# Patient Record
Sex: Female | Born: 1935 | Race: White | Hispanic: No | Smoking: Never smoker
Health system: Southern US, Community
[De-identification: ages and names within clinical notes are randomized; demographics above are authoritative.]

## PROBLEM LIST (undated history)

## (undated) DIAGNOSIS — M419 Scoliosis, unspecified: Secondary | ICD-10-CM

## (undated) DIAGNOSIS — N390 Urinary tract infection, site not specified: Secondary | ICD-10-CM

## (undated) DIAGNOSIS — H269 Unspecified cataract: Secondary | ICD-10-CM

## (undated) DIAGNOSIS — J189 Pneumonia, unspecified organism: Secondary | ICD-10-CM

## (undated) DIAGNOSIS — E785 Hyperlipidemia, unspecified: Secondary | ICD-10-CM

## (undated) DIAGNOSIS — C801 Malignant (primary) neoplasm, unspecified: Secondary | ICD-10-CM

## (undated) DIAGNOSIS — E119 Type 2 diabetes mellitus without complications: Secondary | ICD-10-CM

## (undated) DIAGNOSIS — I10 Essential (primary) hypertension: Secondary | ICD-10-CM

## (undated) DIAGNOSIS — I639 Cerebral infarction, unspecified: Secondary | ICD-10-CM

## (undated) DIAGNOSIS — I4891 Unspecified atrial fibrillation: Secondary | ICD-10-CM

## (undated) DIAGNOSIS — M542 Cervicalgia: Secondary | ICD-10-CM

## (undated) DIAGNOSIS — S065XAA Traumatic subdural hemorrhage with loss of consciousness status unknown, initial encounter: Secondary | ICD-10-CM

## (undated) DIAGNOSIS — M1990 Unspecified osteoarthritis, unspecified site: Secondary | ICD-10-CM

## (undated) HISTORY — PX: CHOLECYSTECTOMY: SHX55

## (undated) HISTORY — DX: Traumatic subdural hemorrhage with loss of consciousness status unknown, initial encounter: S06.5XAA

## (undated) HISTORY — DX: Scoliosis, unspecified: M41.9

## (undated) HISTORY — DX: Unspecified osteoarthritis, unspecified site: M19.90

## (undated) HISTORY — DX: Essential (primary) hypertension: I10

## (undated) HISTORY — DX: Hyperlipidemia, unspecified: E78.5

## (undated) HISTORY — DX: Cervicalgia: M54.2

## (undated) HISTORY — DX: Urinary tract infection, site not specified: N39.0

## (undated) HISTORY — PX: EYE SURGERY: SHX253

## (undated) HISTORY — DX: Type 2 diabetes mellitus without complications: E11.9

## (undated) HISTORY — PX: REPLACEMENT TOTAL KNEE BILATERAL: SUR1225

## (undated) HISTORY — PX: APPENDECTOMY: SHX54

## (undated) HISTORY — DX: Unspecified cataract: H26.9

## (undated) HISTORY — DX: Malignant (primary) neoplasm, unspecified: C80.1

## (undated) HISTORY — PX: TONSILLECTOMY: SUR1361

## (undated) HISTORY — DX: Cerebral infarction, unspecified: I63.9

## (undated) HISTORY — PX: BREAST LUMPECTOMY: SHX2

## (undated) HISTORY — PX: JOINT REPLACEMENT: SHX530

---

## 1997-08-15 ENCOUNTER — Other Ambulatory Visit: Admission: RE | Admit: 1997-08-15 | Discharge: 1997-08-15 | Payer: Self-pay | Admitting: Cardiology

## 1999-01-25 ENCOUNTER — Other Ambulatory Visit: Admission: RE | Admit: 1999-01-25 | Discharge: 1999-01-25 | Payer: Self-pay | Admitting: Family Medicine

## 2000-03-04 ENCOUNTER — Encounter: Payer: Self-pay | Admitting: Family Medicine

## 2000-03-04 ENCOUNTER — Encounter: Admission: RE | Admit: 2000-03-04 | Discharge: 2000-03-04 | Payer: Self-pay | Admitting: Family Medicine

## 2001-03-25 ENCOUNTER — Encounter: Admission: RE | Admit: 2001-03-25 | Discharge: 2001-03-25 | Payer: Self-pay | Admitting: Family Medicine

## 2001-03-25 ENCOUNTER — Encounter: Payer: Self-pay | Admitting: Family Medicine

## 2001-09-17 ENCOUNTER — Encounter: Payer: Self-pay | Admitting: Family Medicine

## 2001-09-17 ENCOUNTER — Encounter: Admission: RE | Admit: 2001-09-17 | Discharge: 2001-09-17 | Payer: Self-pay | Admitting: Family Medicine

## 2001-09-17 ENCOUNTER — Other Ambulatory Visit: Admission: RE | Admit: 2001-09-17 | Discharge: 2001-09-17 | Payer: Self-pay | Admitting: Radiology

## 2001-09-21 ENCOUNTER — Encounter: Payer: Self-pay | Admitting: General Surgery

## 2001-09-21 ENCOUNTER — Encounter: Admission: RE | Admit: 2001-09-21 | Discharge: 2001-09-21 | Payer: Self-pay | Admitting: General Surgery

## 2001-09-24 ENCOUNTER — Encounter: Payer: Self-pay | Admitting: General Surgery

## 2001-09-24 ENCOUNTER — Ambulatory Visit (HOSPITAL_BASED_OUTPATIENT_CLINIC_OR_DEPARTMENT_OTHER): Admission: RE | Admit: 2001-09-24 | Discharge: 2001-09-25 | Payer: Self-pay | Admitting: General Surgery

## 2001-10-04 ENCOUNTER — Ambulatory Visit: Admission: RE | Admit: 2001-10-04 | Discharge: 2002-01-02 | Payer: Self-pay | Admitting: Radiation Oncology

## 2001-10-13 ENCOUNTER — Ambulatory Visit (HOSPITAL_COMMUNITY): Admission: RE | Admit: 2001-10-13 | Discharge: 2001-10-13 | Payer: Self-pay | Admitting: Oncology

## 2001-10-13 ENCOUNTER — Encounter: Payer: Self-pay | Admitting: Oncology

## 2002-03-08 ENCOUNTER — Encounter: Payer: Self-pay | Admitting: Orthopedic Surgery

## 2002-03-14 ENCOUNTER — Inpatient Hospital Stay (HOSPITAL_COMMUNITY): Admission: RE | Admit: 2002-03-14 | Discharge: 2002-03-17 | Payer: Self-pay | Admitting: Orthopedic Surgery

## 2002-03-21 ENCOUNTER — Ambulatory Visit (HOSPITAL_COMMUNITY): Admission: RE | Admit: 2002-03-21 | Discharge: 2002-03-21 | Payer: Self-pay | Admitting: Orthopedic Surgery

## 2002-05-19 ENCOUNTER — Emergency Department (HOSPITAL_COMMUNITY): Admission: EM | Admit: 2002-05-19 | Discharge: 2002-05-19 | Payer: Self-pay | Admitting: Emergency Medicine

## 2002-05-23 ENCOUNTER — Inpatient Hospital Stay (HOSPITAL_COMMUNITY): Admission: EM | Admit: 2002-05-23 | Discharge: 2002-05-27 | Payer: Self-pay | Admitting: Emergency Medicine

## 2002-05-24 ENCOUNTER — Encounter: Payer: Self-pay | Admitting: Orthopedic Surgery

## 2002-06-14 ENCOUNTER — Encounter: Payer: Self-pay | Admitting: Emergency Medicine

## 2002-06-15 ENCOUNTER — Encounter: Payer: Self-pay | Admitting: Internal Medicine

## 2002-06-15 ENCOUNTER — Inpatient Hospital Stay (HOSPITAL_COMMUNITY): Admission: EM | Admit: 2002-06-15 | Discharge: 2002-06-17 | Payer: Self-pay | Admitting: Emergency Medicine

## 2002-08-10 ENCOUNTER — Encounter: Admission: RE | Admit: 2002-08-10 | Discharge: 2002-08-10 | Payer: Self-pay | Admitting: General Surgery

## 2002-08-10 ENCOUNTER — Encounter: Payer: Self-pay | Admitting: General Surgery

## 2002-11-11 ENCOUNTER — Encounter: Payer: Self-pay | Admitting: Family Medicine

## 2002-11-11 ENCOUNTER — Encounter: Admission: RE | Admit: 2002-11-11 | Discharge: 2002-11-11 | Payer: Self-pay | Admitting: Family Medicine

## 2002-11-28 ENCOUNTER — Encounter: Payer: Self-pay | Admitting: Critical Care Medicine

## 2002-11-28 ENCOUNTER — Ambulatory Visit (HOSPITAL_COMMUNITY): Admission: RE | Admit: 2002-11-28 | Discharge: 2002-11-28 | Payer: Self-pay | Admitting: Critical Care Medicine

## 2003-02-16 ENCOUNTER — Encounter: Payer: Self-pay | Admitting: Orthopedic Surgery

## 2003-02-16 ENCOUNTER — Ambulatory Visit (HOSPITAL_COMMUNITY): Admission: RE | Admit: 2003-02-16 | Discharge: 2003-02-16 | Payer: Self-pay | Admitting: Orthopedic Surgery

## 2003-08-08 ENCOUNTER — Encounter: Admission: RE | Admit: 2003-08-08 | Discharge: 2003-08-08 | Payer: Self-pay | Admitting: Critical Care Medicine

## 2003-08-21 ENCOUNTER — Encounter: Admission: RE | Admit: 2003-08-21 | Discharge: 2003-08-21 | Payer: Self-pay | Admitting: General Surgery

## 2003-09-13 ENCOUNTER — Encounter: Admission: RE | Admit: 2003-09-13 | Discharge: 2003-12-12 | Payer: Self-pay | Admitting: Family Medicine

## 2004-03-18 ENCOUNTER — Ambulatory Visit: Payer: Self-pay | Admitting: Critical Care Medicine

## 2004-04-08 ENCOUNTER — Encounter: Admission: RE | Admit: 2004-04-08 | Discharge: 2004-04-08 | Payer: Self-pay | Admitting: Family Medicine

## 2004-07-16 ENCOUNTER — Ambulatory Visit: Payer: Self-pay | Admitting: Critical Care Medicine

## 2004-08-13 ENCOUNTER — Ambulatory Visit: Payer: Self-pay | Admitting: Oncology

## 2004-08-21 ENCOUNTER — Encounter: Admission: RE | Admit: 2004-08-21 | Discharge: 2004-08-21 | Payer: Self-pay | Admitting: Family Medicine

## 2004-11-18 ENCOUNTER — Ambulatory Visit: Payer: Self-pay | Admitting: Critical Care Medicine

## 2005-02-10 ENCOUNTER — Ambulatory Visit: Payer: Self-pay | Admitting: Oncology

## 2005-03-13 ENCOUNTER — Ambulatory Visit: Payer: Self-pay | Admitting: Critical Care Medicine

## 2005-07-21 ENCOUNTER — Ambulatory Visit: Payer: Self-pay | Admitting: Critical Care Medicine

## 2005-07-23 ENCOUNTER — Ambulatory Visit: Payer: Self-pay | Admitting: Cardiology

## 2005-08-09 ENCOUNTER — Ambulatory Visit: Payer: Self-pay | Admitting: Critical Care Medicine

## 2005-08-11 ENCOUNTER — Ambulatory Visit: Payer: Self-pay | Admitting: Oncology

## 2005-08-11 LAB — CBC WITH DIFFERENTIAL/PLATELET
BASO%: 0.5 % (ref 0.0–2.0)
HCT: 39.6 % (ref 34.8–46.6)
LYMPH%: 16.7 % (ref 14.0–48.0)
MCH: 29.8 pg (ref 26.0–34.0)
MCHC: 34.3 g/dL (ref 32.0–36.0)
MCV: 87 fL (ref 81.0–101.0)
MONO#: 0.6 10*3/uL (ref 0.1–0.9)
MONO%: 9.2 % (ref 0.0–13.0)
NEUT%: 70.1 % (ref 39.6–76.8)
Platelets: 225 10*3/uL (ref 145–400)
RBC: 4.55 10*6/uL (ref 3.70–5.32)
WBC: 6.3 10*3/uL (ref 3.9–10.0)

## 2005-08-22 ENCOUNTER — Encounter: Admission: RE | Admit: 2005-08-22 | Discharge: 2005-08-22 | Payer: Self-pay | Admitting: Oncology

## 2005-09-15 LAB — CBC WITH DIFFERENTIAL/PLATELET
BASO%: 0.2 % (ref 0.0–2.0)
EOS%: 2.7 % (ref 0.0–7.0)
Eosinophils Absolute: 0.2 10*3/uL (ref 0.0–0.5)
LYMPH%: 13.3 % — ABNORMAL LOW (ref 14.0–48.0)
MCHC: 34.7 g/dL (ref 32.0–36.0)
MCV: 87.3 fL (ref 81.0–101.0)
MONO%: 9.5 % (ref 0.0–13.0)
NEUT#: 5.6 10*3/uL (ref 1.5–6.5)
Platelets: 229 10*3/uL (ref 145–400)
RBC: 4.6 10*6/uL (ref 3.70–5.32)
RDW: 13.8 % (ref 11.3–14.5)

## 2005-09-15 LAB — COMPREHENSIVE METABOLIC PANEL
ALT: 45 U/L — ABNORMAL HIGH (ref 0–40)
AST: 29 U/L (ref 0–37)
Albumin: 4.1 g/dL (ref 3.5–5.2)
Alkaline Phosphatase: 68 U/L (ref 39–117)
Glucose, Bld: 203 mg/dL — ABNORMAL HIGH (ref 70–99)
Potassium: 4.7 mEq/L (ref 3.5–5.3)
Sodium: 138 mEq/L (ref 135–145)
Total Bilirubin: 0.9 mg/dL (ref 0.3–1.2)
Total Protein: 6.6 g/dL (ref 6.0–8.3)

## 2005-11-25 ENCOUNTER — Inpatient Hospital Stay (HOSPITAL_COMMUNITY): Admission: EM | Admit: 2005-11-25 | Discharge: 2005-12-01 | Payer: Self-pay | Admitting: Emergency Medicine

## 2005-11-26 ENCOUNTER — Encounter: Payer: Self-pay | Admitting: Cardiovascular Disease

## 2005-11-26 ENCOUNTER — Encounter (INDEPENDENT_AMBULATORY_CARE_PROVIDER_SITE_OTHER): Payer: Self-pay | Admitting: Neurology

## 2005-11-26 ENCOUNTER — Ambulatory Visit: Payer: Self-pay | Admitting: Cardiovascular Disease

## 2005-11-28 ENCOUNTER — Ambulatory Visit: Payer: Self-pay | Admitting: Physical Medicine & Rehabilitation

## 2005-12-01 ENCOUNTER — Inpatient Hospital Stay (HOSPITAL_COMMUNITY)
Admission: RE | Admit: 2005-12-01 | Discharge: 2005-12-09 | Payer: Self-pay | Admitting: Physical Medicine & Rehabilitation

## 2006-01-07 ENCOUNTER — Encounter
Admission: RE | Admit: 2006-01-07 | Discharge: 2006-04-07 | Payer: Self-pay | Admitting: Physical Medicine & Rehabilitation

## 2006-01-07 ENCOUNTER — Ambulatory Visit: Payer: Self-pay | Admitting: Physical Medicine & Rehabilitation

## 2006-01-14 ENCOUNTER — Ambulatory Visit: Payer: Self-pay | Admitting: Critical Care Medicine

## 2006-03-12 ENCOUNTER — Ambulatory Visit: Payer: Self-pay | Admitting: Oncology

## 2006-03-16 LAB — COMPREHENSIVE METABOLIC PANEL
ALT: 35 U/L (ref 0–35)
AST: 22 U/L (ref 0–37)
BUN: 16 mg/dL (ref 6–23)
CO2: 30 mEq/L (ref 19–32)
Creatinine, Ser: 0.74 mg/dL (ref 0.40–1.20)
Glucose, Bld: 153 mg/dL — ABNORMAL HIGH (ref 70–99)
Sodium: 138 mEq/L (ref 135–145)
Total Protein: 6.3 g/dL (ref 6.0–8.3)

## 2006-03-16 LAB — CBC WITH DIFFERENTIAL/PLATELET
BASO%: 0.5 % (ref 0.0–2.0)
Basophils Absolute: 0 10e3/uL (ref 0.0–0.1)
EOS%: 5.3 % (ref 0.0–7.0)
Eosinophils Absolute: 0.4 10e3/uL (ref 0.0–0.5)
HCT: 41.8 % (ref 34.8–46.6)
HGB: 14.4 g/dL (ref 11.6–15.9)
LYMPH%: 13 % — ABNORMAL LOW (ref 14.0–48.0)
MCH: 29.6 pg (ref 26.0–34.0)
MCHC: 34.5 g/dL (ref 32.0–36.0)
MCV: 86 fL (ref 81.0–101.0)
MONO#: 0.5 10e3/uL (ref 0.1–0.9)
MONO%: 7.5 % (ref 0.0–13.0)
NEUT#: 5.2 10e3/uL (ref 1.5–6.5)
NEUT%: 73.7 % (ref 39.6–76.8)
Platelets: 247 10e3/uL (ref 145–400)
RBC: 4.86 10e6/uL (ref 3.70–5.32)
RDW: 13.6 % (ref 11.3–14.5)
WBC: 7.1 10e3/uL (ref 3.9–10.0)
lymph#: 0.9 10e3/uL (ref 0.9–3.3)

## 2006-03-16 LAB — CANCER ANTIGEN 27.29: CA 27.29: 23 U/mL (ref 0–39)

## 2006-07-07 ENCOUNTER — Ambulatory Visit: Payer: Self-pay | Admitting: Critical Care Medicine

## 2006-08-24 ENCOUNTER — Encounter: Admission: RE | Admit: 2006-08-24 | Discharge: 2006-08-24 | Payer: Self-pay | Admitting: Oncology

## 2006-09-08 ENCOUNTER — Ambulatory Visit: Payer: Self-pay | Admitting: Critical Care Medicine

## 2006-09-10 ENCOUNTER — Ambulatory Visit: Payer: Self-pay | Admitting: Oncology

## 2006-09-14 LAB — CBC WITH DIFFERENTIAL/PLATELET
BASO%: 0.2 % (ref 0.0–2.0)
EOS%: 4.3 % (ref 0.0–7.0)
HCT: 39.6 % (ref 34.8–46.6)
LYMPH%: 13.4 % — ABNORMAL LOW (ref 14.0–48.0)
MCH: 29.8 pg (ref 26.0–34.0)
MCHC: 35.7 g/dL (ref 32.0–36.0)
NEUT%: 72.8 % (ref 39.6–76.8)
Platelets: 298 10*3/uL (ref 145–400)
RBC: 4.75 10*6/uL (ref 3.70–5.32)
WBC: 8 10*3/uL (ref 3.9–10.0)
lymph#: 1.1 10*3/uL (ref 0.9–3.3)

## 2006-09-14 LAB — COMPREHENSIVE METABOLIC PANEL
ALT: 50 U/L — ABNORMAL HIGH (ref 0–35)
Albumin: 3.8 g/dL (ref 3.5–5.2)
Alkaline Phosphatase: 75 U/L (ref 39–117)
Glucose, Bld: 245 mg/dL — ABNORMAL HIGH (ref 70–99)
Potassium: 4.1 mEq/L (ref 3.5–5.3)
Sodium: 133 mEq/L — ABNORMAL LOW (ref 135–145)
Total Protein: 6.5 g/dL (ref 6.0–8.3)

## 2007-03-08 ENCOUNTER — Ambulatory Visit: Payer: Self-pay | Admitting: Oncology

## 2007-03-08 DIAGNOSIS — I10 Essential (primary) hypertension: Secondary | ICD-10-CM

## 2007-03-08 DIAGNOSIS — E1159 Type 2 diabetes mellitus with other circulatory complications: Secondary | ICD-10-CM

## 2007-03-08 DIAGNOSIS — E1169 Type 2 diabetes mellitus with other specified complication: Secondary | ICD-10-CM

## 2007-03-08 DIAGNOSIS — E785 Hyperlipidemia, unspecified: Secondary | ICD-10-CM

## 2007-03-08 DIAGNOSIS — Z853 Personal history of malignant neoplasm of breast: Secondary | ICD-10-CM

## 2007-03-08 DIAGNOSIS — J8410 Pulmonary fibrosis, unspecified: Secondary | ICD-10-CM

## 2007-03-08 DIAGNOSIS — K219 Gastro-esophageal reflux disease without esophagitis: Secondary | ICD-10-CM

## 2007-03-10 LAB — CBC WITH DIFFERENTIAL/PLATELET
BASO%: 0.6 % (ref 0.0–2.0)
EOS%: 4 % (ref 0.0–7.0)
LYMPH%: 20.2 % (ref 14.0–48.0)
MCH: 30.5 pg (ref 26.0–34.0)
MCHC: 35.6 g/dL (ref 32.0–36.0)
MCV: 85.8 fL (ref 81.0–101.0)
MONO%: 8.6 % (ref 0.0–13.0)
NEUT%: 66.6 % (ref 39.6–76.8)
Platelets: 247 10*3/uL (ref 145–400)
RBC: 4.32 10*6/uL (ref 3.70–5.32)
WBC: 6.3 10*3/uL (ref 3.9–10.0)

## 2007-03-10 LAB — COMPREHENSIVE METABOLIC PANEL
ALT: 58 U/L — ABNORMAL HIGH (ref 0–35)
Alkaline Phosphatase: 75 U/L (ref 39–117)
Creatinine, Ser: 1.44 mg/dL — ABNORMAL HIGH (ref 0.40–1.20)
Sodium: 135 mEq/L (ref 135–145)
Total Bilirubin: 0.6 mg/dL (ref 0.3–1.2)
Total Protein: 6.8 g/dL (ref 6.0–8.3)

## 2007-03-11 ENCOUNTER — Encounter: Admission: RE | Admit: 2007-03-11 | Discharge: 2007-03-11 | Payer: Self-pay | Admitting: Oncology

## 2007-04-09 ENCOUNTER — Ambulatory Visit: Payer: Self-pay | Admitting: Critical Care Medicine

## 2007-04-09 ENCOUNTER — Encounter: Payer: Self-pay | Admitting: Critical Care Medicine

## 2007-04-09 DIAGNOSIS — M1990 Unspecified osteoarthritis, unspecified site: Secondary | ICD-10-CM | POA: Insufficient documentation

## 2007-04-12 ENCOUNTER — Telehealth (INDEPENDENT_AMBULATORY_CARE_PROVIDER_SITE_OTHER): Payer: Self-pay | Admitting: *Deleted

## 2007-04-15 ENCOUNTER — Ambulatory Visit: Payer: Self-pay | Admitting: Cardiovascular Disease

## 2007-05-17 ENCOUNTER — Inpatient Hospital Stay (HOSPITAL_COMMUNITY): Admission: RE | Admit: 2007-05-17 | Discharge: 2007-05-22 | Payer: Self-pay | Admitting: Orthopedic Surgery

## 2007-05-21 ENCOUNTER — Encounter (INDEPENDENT_AMBULATORY_CARE_PROVIDER_SITE_OTHER): Payer: Self-pay | Admitting: Orthopedic Surgery

## 2007-05-21 ENCOUNTER — Ambulatory Visit: Payer: Self-pay | Admitting: Surgery

## 2007-06-07 ENCOUNTER — Encounter: Admission: RE | Admit: 2007-06-07 | Discharge: 2007-06-07 | Payer: Self-pay | Admitting: Orthopaedic Surgery

## 2007-06-23 ENCOUNTER — Ambulatory Visit: Payer: Self-pay | Admitting: Internal Medicine

## 2007-06-30 ENCOUNTER — Ambulatory Visit: Payer: Self-pay | Admitting: Internal Medicine

## 2007-07-07 ENCOUNTER — Ambulatory Visit: Payer: Self-pay | Admitting: Cardiology

## 2007-10-06 ENCOUNTER — Encounter: Admission: RE | Admit: 2007-10-06 | Discharge: 2007-10-06 | Payer: Self-pay | Admitting: Unknown Physician Specialty

## 2008-04-11 ENCOUNTER — Encounter: Admission: RE | Admit: 2008-04-11 | Discharge: 2008-04-11 | Payer: Self-pay | Admitting: Unknown Physician Specialty

## 2008-08-01 DIAGNOSIS — I82409 Acute embolism and thrombosis of unspecified deep veins of unspecified lower extremity: Secondary | ICD-10-CM | POA: Insufficient documentation

## 2008-08-01 DIAGNOSIS — E119 Type 2 diabetes mellitus without complications: Secondary | ICD-10-CM | POA: Insufficient documentation

## 2008-08-01 DIAGNOSIS — I6350 Cerebral infarction due to unspecified occlusion or stenosis of unspecified cerebral artery: Secondary | ICD-10-CM | POA: Insufficient documentation

## 2008-10-06 ENCOUNTER — Encounter: Admission: RE | Admit: 2008-10-06 | Discharge: 2008-10-06 | Payer: Self-pay | Admitting: Unknown Physician Specialty

## 2009-12-28 ENCOUNTER — Ambulatory Visit (HOSPITAL_COMMUNITY): Admission: RE | Admit: 2009-12-28 | Discharge: 2009-12-28 | Payer: Self-pay | Admitting: Obstetrics and Gynecology

## 2010-05-26 ENCOUNTER — Encounter: Payer: Self-pay | Admitting: Unknown Physician Specialty

## 2010-09-17 NOTE — Assessment & Plan Note (Signed)
[facility]                             PULMONARY OFFICE NOTE   NAME:[name], [name]                     MRN:          [id number]  DATE:09/08/2006                            DOB:          1926/03/06    [name] is a 77 year old white female with history of idiopathic  pulmonary fibrosis, hypertension and hypercholesterolemia, history of  breast cancer and reflux disease.  She has had increased dry cough for 1  month and this is concordance with a recent initiation of  lisinopril/hydrochlorothiazide 20/25 daily.  She does have reflux  disease and has now been able to achieve omeprazole at 20 mg b.i.d. with  her current insurance coverage.  She is off systemic steroids for her  pulmonary fibrosis, which we felt was due to microaspiration from  esophageal dysfunction and reflux.   PHYSICAL EXAMINATION:  Temperature was 98, blood pressure 118/80, pulse  102, saturation 92% on room air.  CHEST:  Showed dry rales at the bases, no active wheezing or rhonchi.  CARDIAC:  Exam showed a regular rate and rhythm without S3, normal S1  and S2.  ABDOMEN:  Protuberant.  EXTREMITIES:  Showed no edema or clubbing.  SKIN:  Clear.  NEUROLOGIC:  Exam was intact.  HEENT/NECK:  Exam showed no jugulovenous distention and no  lymphadenopathy, oropharynx clear, neck supple.   IMPRESSION:  Pulmonary fibrosis on the basis of chronic reflux disease  and microaspiration, stable at this time, exacerbated though with cyclic  cough on the basis of ACE inhibitor use.   RECOMMENDATIONS:  To give all due consideration to switching lisinopril  to another type of antihypertensive agent; the patient will discuss this  with Dr. Rosiland Oz.     [name], MD, [facility]  Electronically Signed    PEW/MedQ  DD: 09/08/2006  DT: 09/09/2006  Job #: [id number]   cc:   Avelino Leeds

## 2010-09-17 NOTE — Discharge Summary (Signed)
NAMEALVERDA, NAZZARO NO.:  [id number]   MEDICAL RECORD NO.:  [id number]          PATIENT TYPE:  INP   LOCATION:  5013                         FACILITY:  [facility]   PHYSICIAN:  Feliberto Gottron. Turner Daniels, M.D.   DATE OF BIRTH:  [birth year]   DATE OF ADMISSION:  05/17/2007  DATE OF DISCHARGE:  05/22/2007                               DISCHARGE SUMMARY   DISCHARGE DIAGNOSIS:  End-stage degenerative joint disease of the left  knee.   PROCEDURE:  Left total knee arthroplasty.   HISTORY OF PRESENT ILLNESS:  The patient is a 77 year old woman who  underwent a successful right total knee using Osteonics Scorpio  components in 2004.  She has had known end-stage arthritis in her left  knee and desires total knee arthroplasty on the left side.  The patient  has significant medical risks that prevented this previously, but has  had medical clearance to continue and after discussing risks and  benefits of the surgery, questions were answered, and the patient wishes  to proceed with surgery.   ALLERGIES:  No known drug allergies.   ADMISSION MEDICATIONS:  1. Pravastatin 20 mg daily.  2. Diclofenac 75 mg twice daily that she discontinued prior to her      surgery.  3. Glyburide 5 mg twice daily.  4. Nabumetone 500 mg b.i.d. which she again discontinued.  5. Micardis hydrochlorothiazide 40/12.5 b.i.d.  6. Imipram hydrochloride two tablets twice daily.  7. Atenolol 25 mg daily.  8. Primrose oil daily.  9. Multivitamin.  10.Daily aspirin.  11.Calcium with zinc.  12.Metoprolol.  13.Vitamin D.  14.Nexium 40 mg daily.  15.Oxycodone every 4-6 hours as needed.  16.Aciphex 20 mg daily.   PAST MEDICAL HISTORY:  Usual childhood diseases.  Adult history; DJD,  breast cancer, pulmonary fibrosis, GERD, hypertension, increased  cholesterol, DVT, aortic valve sclerosis with murmur, diabetes, and  small stroke in 2008.   PAST SURGICAL HISTORY:  Right total knee arthroplasty, mastectomy,  appendectomy.  No difficulty with GET.   SOCIAL HISTORY:  No tobacco, no ethanol, no IV drug abuse.  She is  married and retired.   FAMILY HISTORY:  Father died at age unknown.  Mother died of lung  cancer.   REVIEW OF SYSTEMS:  Positive for glasses.  The patient denies any  shortness of breath, chest pain, or recent illness.   PHYSICAL EXAMINATION:  VITAL SIGNS:  Temperature 97.8, pulse 94, blood  pressure 135/83.  The patient is 5 feet tall, 155 pound female.  HEENT:  Normocephalic and atraumatic.  Ears; TM's clear.  Eyes; pupils  equal, round, and reactive to light and accommodation.  Nose patent.  Throat benign.  NECK:  Supple.  Some pain to range of motion, but no obvious limits to  range.  CHEST:  Clear to auscultation and percussion.  HEART:  Regular rate and rhythm.  EXTREMITIES:  Left knee range of motion 5-95 degrees with pain and  crepitus.  Right knee range of motion 0-110 degrees, well-healed normal  scar without pain.   X-rays show bone on bone changes of the left knee.  LABORATORY DATA:  Preoperative labs including CBC, CMET, chest x-ray,  EKG, PT, and PTT were all within normal limits with the exception of  sinus bradycardia on her EKG as well as sodium level of 129 and glucose  of 411.  Also of note, AST and ALT were both high at 42 and 64  respectively.   HOSPITAL COURSE:  On the day of admission, the patient was taken to the  operating room at [facility] where she underwent a left total  knee arthroplasty using Osteonics Scorpio components.  They were  cemented with double batch palico cement that had 1500 mg of Zinacef  impregnated.  A #7 femur, #7 tibia, 7 patellar button, and 10 mm TS  spacer were used.  All were [employer] system.  Medium Hemovac  was double armed into the wound.  Foley catheter was placed  perioperatively.  The patient was placed on postoperative Coumadin  prophylaxis with a target INR of 1.5 to 2.  She was placed on   perioperative antibiotics.  She was placed on postoperative Dilaudid PCA  for pain control.  Physical therapy was begun in the recovery room using  CPM.  On postoperative day #1, the patient was awake and alert.  Pain  was 4/10.  She was taking p.o. well with no nausea and vomiting.  Vital  signs were stable.  Hemoglobin was 11.1.  Blood sugars 219, urine output  450, drain output less than 75 mL and discontinued.  She was  neurovascularly intact and otherwise stable.   On postoperative day #2, the patient was without complaints.  She was  out of bed to a chair.  She was afebrile.  Vital signs were stable.  Hemoglobin 10.2, INR 1.3.  Dressing was dry.  Calves were both soft.  She was tolerating the CPM well.   On postoperative day #3 the patient was complaining of minimal pain.  Hemoglobin 9.6, WBC 8.0, temperature ranging from 99.1 to 98.4.  CBG's  ranging from 219 to 238, INR 1.5.  Dressing was dry.  There was  increased ecchymosis in the calf.  She had not yet met her physical  therapy goals of safe transfers and ambulation, so she was held for an  additional day.   On postoperative day #4, the patient was complaining of greatly  increased pain in her left calf as well as swelling.  She was afebrile,  vital signs stable, dressing dry.  She was not short of breath or  complaining of any chest pain.  The calf was significantly swollen, much  more so than the day before with positive ecchymosis.  INR 1.7.   ASSESSMENT:  Satisfactory postoperative course, but with possible deep  venous thrombosis.  Venous Dopplers were ordered.  Fortunately they were  negative for any positive findings of any type of deep venous  thrombosis.  The following day, postoperative day #5, the patient was  mobilizing with minimal assistance, still complaining of calf pain.  Hemoglobin 9.6, INR 1.9.  Once again the Doppler was negative for any  deep venous thrombosis.  Wound was without any sign of infection.   She  was considered to be medically stable, orthopedically improved, and was  transferred home to the care of her family.   DISCHARGE MEDICATIONS:  1. Coumadin.  2. Percocet.  Prescriptions were added to instructions to restart her      home medicines as per home medicine rec sheet.   She will be followed up  for her home health PT, CPM, and any other home  health needs through [facility].  She should return to see [place] in  one weeks' time or sooner if she should have any drainage from the  wound, any pain that is not well controlled by oral pain medication, any  sort of fever greater than 101.  Her diet is regular.  She will need to  continue to use walker and be weightbearing as tolerated.  Dressing  changes daily or as needed.   DISCHARGE DIAGNOSIS:  End-stage degenerative joint disease left knee.   PROCEDURE:  Left total knee arthroplasty.      Laural Benes. Jannet Mantis.      Feliberto Gottron. Turner Daniels, M.D.  Electronically Signed    JBR/MEDQ  D:  06/07/2007  T:  06/07/2007  Job:  [id number]

## 2010-09-17 NOTE — Assessment & Plan Note (Signed)
[facility]                            CARDIOLOGY OFFICE NOTE   NAME:[name], [name]                     MRN:          [id number]  DATE:04/15/2007                            DOB:          [birth year]    Ms. [name] is a patient of Dr. Turner Daniels and Dr. Delford Field.  She is  referred for preoperative clearance.  She has a history of hypertension,  hypercholesterolemia, murmur.   The patient has had a previous right knee replacement about 4 years ago,  this was complicated by DVT.  She was on Coumadin for a while.   Dr. Delford Field has seen her in regards to her lungs.  She has a history of  interstitial fibrosis which is improved.  She has been off oxygen for a  year.  Her activities are limited primarily by her left knee pain.  She  has some exertional dyspnea which is stable, there has been no cough,  sputum or fever.  I do not have recent PFTs on the patient.  She does  have a history of breast cancer.  She underwent bilateral mastectomies,  there was also radiatio and I suspect that some of the fibrosis,  particularly in her upper lobes was from radiation.  She declined to  have chemotherapy as she thought it would be too hard on her husband.   Patient's coronary risk factors include hypertension, hyperlipidemia and  diabetes.  She has not had a recent stress test, however, she has never  had chest pain.  She did have a 2D echocardiogram done July 2007 which  was normal with some aortic valve sclerosis.   In talking to the patient I told her I think she would be cleared for  surgery.  She was previously on a beta blocker, her blood pressure seems  to be running a little bit high.  Over the last 2 months her lisinopril  was changed over to Micardis.  Rather than just increase the dose I  think I will add her beta blocker back prior to surgery.   REVIEW OF SYSTEMS:  Otherwise negative.   PAST MEDICAL HISTORY:  1. Breast cancer with radiation and  bilateral mastectomies.  2. Pulmonary fibrosis.  3. Previous GERD.  4. T&A.  5. Status post appendectomy.  6. Previous right knee surgery.  7. Hypercholesterolemia.  8. Hypertension.  9. Diabetes.   FAMILY HISTORY:  Noncontributory.   SHE HAS NO KNOWN ALLERGIES.   The patient is married.  She is a nondrinker, nonsmoker.  She is looking  forward to getting her knee replaced and has good family support.  Patient is retired and currently not working.  Activities are limited by  left knee pain.   MEDICATIONS:  1. Protonix 40 a day.  2. Prilosec p.r.n.  3. Multivitamins.  4. Micardis 40/12.5.  5. Calcium.  6. Imipramine 20 b.i.d.  7. Nabumetone.  8. Relafen 75 b.i.d.  9. Glyburide 5 a day.  10.Pravastatin 20 a day.   EXAMINATION:  Remarkable for an elderly white female in no distress.  Blood pressure is 150/80, pulse is 80 and  regular, weight is 175,  respiratory rate 14.  HEENT:  Unremarkable.  Carotids normal without bruit, no lymphadenopathy  or thyromegaly or JVP elevation.  LUNGS:  Clear with no interstitial fibrotic changes, no active wheezing,  good diaphragmatic motion.  S1 and S2 with a systolic ejection murmur with PMI normal.  ABDOMEN:  Benign, bowel sounds positive, no hepatosplenomegaly, no  hepatojugular reflux, no tenderness no bruit.  Distal pulses are intact.  She has mild varicosities in both lower legs,  left greater than right.  NEURO:  Nonfocal.  She has some crepitus in the left knee, she is status post right knee  replacement.   Baseline EKG is normal.   IMPRESSION:  1. In regards to preoperative clearance she should be fine, there is      no active history of coronary disease.  She has normal left      ventricular function by echo.  2. Hypertension, some white coat component.  Continue Micardis, add      beta blocker Toprol at 50 mg a day.  Follow up with Dr. Rosiland Oz      for further blood pressure care.  This would be the best medicine       to have in regards to her periop state.  3. Previous history of deep venous thrombosis, early mobilization and      strict Lovenox use with possible overlap of Coumadin for 6 months      is needed.  This should be done by Dr. Turner Daniels at the time of      surgery.  He did her last surgery and should be well aware that she      had a previous deep venous thrombosis.  I do not think it would be      worthwhile to sclerose her superficial varicosities prior to the      procedure.  4. Hyperlipidemia, continue pravastatin 20 a day.  Lipid and liver      profile in 6 months.  5. Diabetes, may need sliding scale insulin during hospitalization.      Continue glyburide up until the time of surgery.  Check hemoglobin      A1c.  6. History of interstitial fibrosis, seems improved.  Dr. Delford Field has      signed off on her for having surgery.  Her lung exam is benign.      She seems to have had a nice recovery.     Joan Pick. Eden Emms, MD, [facility]  Electronically Signed    PCN/MedQ  DD: 04/15/2007  DT: 04/15/2007  Job #: [id number]

## 2010-09-17 NOTE — Op Note (Signed)
[name], [name] NO.:  [id number]   MEDICAL RECORD NO.:  [id number]          PATIENT TYPE:  INP   LOCATION:  2899                         FACILITY:  [facility]   PHYSICIAN:  Feliberto Gottron. Turner Daniels, M.D.   DATE OF BIRTH:  06/16/1925   DATE OF PROCEDURE:  05/17/2007  DATE OF DISCHARGE:                               OPERATIVE REPORT   PREOPERATIVE DIAGNOSIS:  Osteoarthritis of the left knee.   POSTOPERATIVE DIAGNOSIS:  Osteoarthritis of the left knee.   PROCEDURE:  Left total knee arthroplasty using Osteonics Scorpio  components cemented with a double batch Palacos cement was 1500 mg of  Zinacef. I used a seven femur, seven tibia, seven patellar button  and a  10 mm TS spacer.  The components were from the [facility] system.   SURGEON:  Feliberto Gottron.  Turner Daniels, MD.   FIRST ASSISTANT:  Skip Mayer, PA-C.   ANESTHETIC:  General endotracheal.   ESTIMATED BLOOD LOSS:  Minimal.   FLUID REPLACEMENT:  1500 mL crystalloid.   DRAINS PLACED:  Two small Hemovacs and a Foley catheter.   URINE OUTPUT:  300 mL.   ESTIMATED BLOOD LOSS:  Minimal.   FLUID REPLACEMENT:  1200 mL crystalloid.   TOURNIQUET TIME:  1 hour.   INDICATIONS FOR PROCEDURE:  A 77 year old woman who underwent a  successful right total knee using Osteonics Scorpio components back in  2004 has end-stage arthritis of her left knee and desires total knee  arthroplasty on the left side.  The risks and benefits of surgery are  well-known to the patient and questions have been answered.   DESCRIPTION OF PROCEDURE:  The patient identified by armband, taken to  the operating room at [facility] where the appropriate  anesthetic site monitors were attached and general endotracheal  anesthesia was induced. She did have a femoral nerve block just prior to  being taken back to the operating room. A tourniquet was applied high to  the left thigh, foot positioner and lateral post applied to the table,  left  lower extremity prepped and draped in the usual sterile fashion  from the ankle to the tourniquet. The limb wrapped with an Esmarch  bandage, tourniquet inflated to 350 mmHg. We then began the procedure by  making an anterior midline incision starting a handbreadth above the  patella over the center of the patella and going 1 cm medial to and 3 cm  distal to the tibial tubercle.  Small bleeders in the skin and  subcutaneous tissue identified and cauterized.  The transverse  retinaculum was incised and reflected medially allowing a medial  parapatellar arthrotomy.  The patella was everted, the prepatellar fat  pad and the anterior one-half of the menisci were resected. The  superficial medial collateral ligament was then elevated off the  proximal tibia going from anterior to posterior leaving it intact  distally and stripping it off of the medial and metaphyseal flare of the  tibia. Once this was accomplished, the knee was then hyperflexed,  more  of the menisci resected. The ACL and the PCL were resected with  electrocautery, peripheral osteophytes were removed. A posterior medial  Z retractor was placed, a McHale retractor through the notch and lateral  Homan exposing the proximal tibia which was then entered with the  Osteonics step drill followed by the IM rod and a zero degree posterior  slope cutting guide which was pinned into place allowing resection of 4-  5 mm of bone medially, 7-8 mm of bone laterally consistent with the  varus deformity she had preoperatively.  Satisfied with the proximal  tibial cut, we then directed our attention to the femur and entered it 2-  mm anterior to the PCL origin with a step drill followed by the  intramedullary rod and a 5 degree left distal femoral cutting guide set  for a 10 mm cut.  This was pinned into place along the epicondylar axis  and then the 10 mm distal femoral cut accomplished. We sized for a #7  femoral component and pinned the  cutting guide in 3 degrees of external  rotation this was followed by the chamfer cutting guide which was  hammered into place and held there with towel clips. The anterior,  posterior and chamfer cuts accomplished without difficulty followed by  the Scorpio box cut. The patella was then measured for a #7 patellar  button and the posterior 8-9 mm of the patella resected with the cutting  guide and drilled for a #7 patella. The knee was once again hyperflexed,  the proximal tibia sized to a #7 tibial baseplate which was pinned into  place with the handle along the alignment of the second ray followed by  the Delta fit keel punches up to a 7 cemented punch.  At this point, a  trial 7 left femoral component was hammered into place, a trial seven  tibial baseplate with a 10 spacer was inserted, the knee came to full  extension, flexed to 130 degrees and the seven patellar button trial  tracked without difficulty. All trial components removed. The bony  surfaces were water picked clean, dried with suction and sponges, a  double batch of Palacos polymethyl methacrylate cement with 1500 mg of  Zinacef was then mixed and applied to all bony and metallic mating  surfaces except for the posterior condyles of the femur itself. In  order, we hammered into place a #7 tibial baseplate and removed excess  cement, a #7 left femoral component and removed excess cement and a 10  mm posterior stabilized Scorpio bearing was then snapped into the tibia  and the knee reduced.  A seven patellar button was then clamped onto the  patella and excess cement removed. The knee was brought to full  extension and held in 5 degrees of flexion with compression as the  cement cured. Medium hemostats were placed from a lateral approach.  The  wound was once again water picked clean with pulse lavage and then dried  with suction. After the cement cured, we checked our patella tracking  one more time, the ligaments were also  stable. Parapatellar arthrotomy  was closed with running #1 Vicryl suture, the subcutaneous tissue with 0  and 2-0 undyed Vicryl suture and the skin with skin staples.  A dressing  of Xeroform 4x4 dressing sponges, Webril and an Ace wrap was then  applied.  The tourniquet was let down, the patient awakened and taken to  the recovery room without difficulty.      Feliberto Gottron. Turner Daniels, M.D.  Electronically Signed     FJR/MEDQ  D:  05/17/2007  T:  05/17/2007  Job:  [id number]

## 2010-09-20 NOTE — Discharge Summary (Signed)
NAMEMAYIA, [name]              ACCOUNT NO.:  [id number]   MEDICAL RECORD NO.:  [id number]          PATIENT TYPE:  INP   LOCATION:  3004                         FACILITY:  [facility]   PHYSICIAN:  Melissa L. Ladona Ridgel, MD  DATE OF BIRTH:  11/10/1925   DATE OF PROCEDURE:  DATE OF DISCHARGE:  12/01/2005                      STAT - MUST CHANGE TO CORRECT WORK TYPE   CHIEF COMPLAINT:  At time of the admission:  Left upper and lower extremity  weakness with tingling.   DISCHARGE DIAGNOSES:  1. Right parietal and right thalamic stroke.  The patient was seen and      evaluated by [facility] hospitalist and neurology, had a acute stroke in the      right parietal, right thalamic region.  She was started on aspirin full-      dose therapy and evaluated for comorbid conditions.  She will require a      short rehab stay for dis-coordination of the left lower extremity and      left upper extremity.  2. Newly diagnosed diabetes.  The patient's hemoglobin A1c was noted to be      elevated at 7.5.  She was recently started on Glipizide for better      blood sugar control with good results.  3. Polymyalgia rheumatica.  The patient continued to have discomfort in      her shoulders which has been completely improved by adding a lidocaine      patch.  The patch could be cut into two and placed on both shoulders.      At this point, her right shoulder had been the problematic point, but      it did seem to help a little bit.  She will continue on her methadone      therapy.  4. Hypertension.  Patient is maintained on Cardura and has had reasonable      control in the 130s to 140s systolically.  5. History of breast cancer.  The patient is currently on no medication      for this.  6. Dyslipidemia.  The patient will be maintained on her Zetia.  7. Pulmonary fibrosis.  The patient is clinically stable from that      standpoint.  Her exam correlates with fibrosis in that she had      bibasilar crackles.   MEDICATIONS:  At the time of discharge will be:  1. Cardura 2 mg p.o. daily.  2. Ecotrin 325 mg q. daily.  3. Glucotrol 5 mg p.o. q. daily.  4. Lidoderm patch 5% which can be cut and placed on both shoulders daily      for 12 hours.  5. She has been on Lovenox 40 mg subcu 24 hours.  6. Methadone 5 mg p.o. t.i.d.  7. Multi-vitamin daily.  8. Niacin 500 mg p.o. q.h.s.  9. Os-Cal 1 tablet p.o. b.i.d.  10.Prilosec 20 mg p.o. q. daily with the addition of Protonix 40 mg p.o.      q. daily which is her home dose.  11.Prozac 20 mg p.o. q. daily for depression.  12.Toprol XL 25 mg p.o.  q. daily.  13.Xanax 0.25 mg p.o. daily.  14.Zetia 10 mg p.o. daily.  15.Ambien 5 mg p.o. q.h.s. p.r.n.  16.Percocet 1 tablet p.o. q.8. h. p.r.n.   HOSPITAL COURSE:  This is a very articulate, pleasant 77 year old right-  handed female who presented to the emergency room with left upper and left  lower extremity weakness and tingling.  CT scan of the head was found acute  right thalamic and parietal stroke.  She was, however, outside the window of  intervention for neurology.  Evidently, the patient had also fallen  backwards and hit her head two weeks ago but did not seek her physician for  evaluation.  Patient was initially evaluated by the stroke team but because  of being outside the window of therapeutic intervention, she was admitted to  by the hospitalist service.  The patient was admitted to telemetry, and  showed no evidence for any arrhythmias; however, she underwent a 2D echo  which showed a reasonable ejection fraction of 60%.  She had mild focal  basal septal hypertrophy but no evidence for embolic source was identified.  The patient also underwent carotid studies which showed no obvious internal  carotid artery stenosis, and she had anterograde bilateral vertebral  arterial flow.  The patient underwent MRI of the brain which confirmed  moderate stenosis of the left posterior cerebral artery and  acute infarct in  the right thalamic area.  The patient also has atrophy and chronic small  vessel disease.   At this time, the patient was found to have persisting deficits in the left  upper and lower extremity with dis-coordination and decreased strength.  She, therefore, has been deemed a candidate for inpatient rehabilitation.   On the day of discharge, the patient is clinically stable.  She remains with  some paresthesias in the left fingers and persisting pain from her  polymyalgia rheumatica.  Please note the patient is off Diclofenac because  of the introduction of aspirin.  Therefore, we have elected to add a  lidocaine patch.   PHYSICAL EXAMINATION:  VITAL SIGNS:  The patient's vital signs are stable.  Her temperature is 98, blood pressure 146/74.  Pulse is 64, respirations 18.  Temperature is 98.  Saturation is 96%.  GENERAL:  This is a very pleasant white female in no acute distress.  HEENT:  She is normocephalic, atraumatic.  Pupils are equal, round, reactive  to light.  Extraocular muscles are intact.  Mucous membranes are moist.  NECK:  Supple.  There is no JVD.  No lymphadenopathy, no carotid bruits.  CHEST:  Clear to auscultation in the upper lobes, but she does have some  fine crackles in the bilateral bases consistent with her pulmonary fibrosis.  CARDIOVASCULAR:  Regular rate and rhythm, positive S1, S2, no S3, S4.  No  murmurs, rubs or gallops are noted.  ABDOMEN:  Soft, nontender, nondistended with positive bowel sounds.  EXTREMITIES:  Show no clubbing, cyanosis or edema.  NEUROLOGIC:  She remains with decreased strength of 3+ in the left upper  extremity and 2+ in the left lower extremity with dis-coordination.   PERTINENT LABORATORY VALUES:  During the course of the hospital stay, as  stated the patient's hemoglobin A1c is elevated at 7.5, and she has been  started on Glucotrol for that.  Her homocysteine level is 5.4 requiring no intervention.  Patient's  total cholesterol is 206.  LDL of 147 and HDL of 36  with triglycerides of 117.  She remains  on Zetia and Niacin.  Her PT is 14.  INR was 1.1.  Her discharging BUN was 11, creatinine 0.9.   At this time, the patient seems to be well for followup as an outpatient  with an inpatient rehabilitation today prior to discharge.  Therefore, will  be transferred to the fourth floor for the further work with her left upper  and left lower extremities.      Melissa L. Ladona Ridgel, MD  Electronically Signed     MLT/MEDQ  D:  12/01/2005  T:  12/01/2005  Job:  [id number]   cc:   Gretta Arab. Valentina Lucks, M.D.

## 2010-09-20 NOTE — H&P (Signed)
NAMEJOLONDA, [name] NO.:  [id number]   MEDICAL RECORD NO.:  [id number]          PATIENT TYPE:  IPS   LOCATION:  4035                         FACILITY:  [facility]   PHYSICIAN:  Ranelle Oyster, M.D.DATE OF BIRTH:  23-Jun-1925   DATE OF ADMISSION:  12/01/2005  DATE OF DISCHARGE:                                HISTORY & PHYSICAL   REFERRING NEUROLOGIST:  Deanna Artis. Sharene Skeans, M.D.   PRIMARY CARE PHYSICIAN:  Dr. Valentina Lucks.   CHIEF COMPLAINT:  Left-sided weakness, sensory loss.   HISTORY OF PRESENT ILLNESS:  This 77 year old right-handed white female with  history of polymyalgia rheumatica admitted on November 25, 2005, with left-sided  weakness.  MRI at the time of admission revealed right thalamic infarct and  stenosis of the left PCA.  Echocardiogram  revealed ejection fraction of 60%  and no obvious source of embolus.  Patient's hemoglobin A1c was 7.5 and  patient was placed on glipizide for better glucose control.  The patient was  on aspirin and subcu Lovenox for anticoagulation at this point.   HOME MEDICATIONS:  1.  Protonix 40 mg a day.  2.  Diclofenac b.i.d.  3.  Omeprazole daily.  4.  Cardura 4 mg daily.  5.  Zetia 10 mg daily.  6.  Methadone 5 mg t.i.d.  7.  Prozac 20 mg daily.  8.  Alprazolam 0.25 mg daily.  9.  Oxy IR 5 mg t.i.d. p.r.n.  10. Multivitamins daily.   ALLERGIES:  NO KNOWN DRUG ALLERGIES.   REVIEW OF SYSTEMS:  Positive for low back pain, shoulder pain, depression,  swelling in the joints, reflux disease.   PAST MEDICAL HISTORY:  1.  Polymyalgia rheumatica.  2.  Hypertension.  3.  Hyperlipidemia.  4.  Cervical stenosis.  5.  Left mastectomy secondary to breast cancer.  6.  Right total knee replacement November 2003.  7.  Degenerative shoulder disease.  8.  DVT.  9.  Depression.   FAMILY HISTORY:  Positive for coronary artery disease and cancer.   SOCIAL HISTORY:  Negative for alcohol or tobacco.  Patient lives with her  husband  who can assist her as needed at home.  They have a one level  house  with one step to enter.   FUNCTIONAL HISTORY:  Patient is independent with a cane.  Currently patient  is moderate assist for mobility and self-care.   LABORATORY DATA:  Hemoglobin 13.3, white count 5.4, platelets 180,000.  Sodium 140, potassium 3.7, BUN and creatinine 11 and 0.9.   PHYSICAL EXAMINATION:  GENERAL APPEARANCE:  The patient is pleasant, in no  acute distress.  She is alert and oriented x3.  VITAL SIGNS:  Blood pressure 146/74, pulse 64, respiratory rate 18,  temperature 98.  HEENT:  Unremarkable with good dentition.  Oral mucosa is pink and moist.  NECK:  Supple without JVD or lymphadenopathy.  CHEST:  A few inspiratory wheezes on the right.  CARDIOVASCULAR:  Regular rate and rhythm without murmurs, rubs, or gallops.  ABDOMEN:  Soft and nontender.  NEUROLOGIC:  Cranial nerve exam revealed mild left facial and perioral  sensory loss.  Reflexes are trace to absent on the left side.  Sensation is  1/2 at the left arm and leg today.  Leg may have been a bit more effected  than the arm.  Motor function in the left upper extremity is 2+ to 3+/5.  Left lower extremity motor function is 1+ to 2/5.  Patient has intact  judgment, orientation, memory and mood.  Visual fields were within normal  limits.  Right-sided neurological extremity exam was within normal limits.  Pulses were 2+ and calves were nontender on exam today.  SKIN:  Intact.  Patient had poor range of motion of both shoulders due to  pain.   ASSESSMENT/PLAN:  1.  Functional deficits secondary to right parietal/thalamic infarct.  Begin      comprehensive inpatient rehab with physical therapy to evaluate and      treat lower extremity weakness and gait deficiencies.  Occupational      therapy will assess and treat for upper extremity use with activities of      daily living, 24-hour rehab nursing will assist in management of bowel,      bladder,  skin and medication issues.  Rehab case management/social      worker will assess for psychosocial needs and discharge planning.  2.  Pain management with methadone.  Will increase this to 10 mg t.i.d. for      now.  Consider bilateral shoulder injections.  Continue Lidoderm patch      and oxycodone IR p.r.n.  3.  Deep venous thrombosis prophylaxis with subcutaneous Lovenox.  4.  Stroke anticoagulation with aspirin 325 mg daily.  5.  Hypertension:  Continue Cardura and Toprol.  6.  Depression:  Continue Prozac and Xanax.  7.  Hyperlipidemia:  Zetia.  8.  Non-insulin dependent diabetes:  Continue glipizide 5 mg daily.  Monitor      CBGs a.c. and h.s.      Ranelle Oyster, M.D.  Electronically Signed     ZTS/MEDQ  D:  12/01/2005  T:  12/01/2005  Job:  [id number]

## 2010-09-20 NOTE — Assessment & Plan Note (Signed)
Ms. Kooi returns today after discharge from [facility].  She was an inpatient from December 01, 2005 to December 09, 2005, for right  parietal and thalamic infarct.  She was discharged home and has received  home health, has followed up with Dr. Maurice Small, as well as Dr. Billie Ruddy.  She has not returned to driving yet.  She states that the  Lidoderm patch for her shoulders really has helped her quite a bit, and in  fact we will discuss with her oncologist, Dr. Darnelle Catalan, perhaps reducing  some of her narcotic analgesics.   Her pain is described as 7/10, primarily in the shoulders.   She still needs some help performing meal preparation and household duties,  as well as shopping, but is independent with her self-care.  She walks  slowly, but is able to do so with a cane.  She has some problems with blood  sugar regulation due to diabetes, and has some constipation because of her  narcotic analgesics, but otherwise review of systems is negative.   SOCIAL HISTORY:  Married, lives with her husband.   Blood pressure is 116/69, pulse 66, respirations 18, O2 sat 95% on room air.  GENERAL:  No acute distress.  Mood and affect appropriate.   Examination shows no attempted shoulder abduction.  She complains that she  is afraid it may hurt too much.  She has equal grip strength, equal lower  extremity strength with exception of left ankle dorsiflexion.  Her gait is  without evidence of toe-drag, but she does have mild steppage to compensate  for some mild left ankle weakness.   IMPRESSION:  Cerebrovascular accident with left hemiparesis, improving.  She  has no evidence of left neglect or hemisensory deficits.  I think she can go  back to driving if she wishes, although she is somewhat hesitant to do so at  this point.  She will follow with Dr. Valentina Lucks for her general medical needs,  as well as Dr. Sharene Skeans from neurology.  I have given her some Lidoderm  patches and  this can be filled by her primary physician once these run out.  I will see her back on a p.r.n. basis.      Erick Colace, M.D.  Electronically Signed     AEK/MedQ  D:  01/08/2006 17:09:04  T:  01/09/2006 00:31:35  Job #:  [id number]   cc:   Valentino Hue. Magrinat, M.D.  Fax: [phone]

## 2010-09-20 NOTE — H&P (Signed)
[name], [name] NO.:  [id number]   MEDICAL RECORD NO.:  [id number]          PATIENT TYPE:  EMS   LOCATION:  MAJO                         FACILITY:  [facility]   PHYSICIAN:  Joan Gardener, MD    DATE OF BIRTH:  [birth year]   DATE OF ADMISSION:  11/25/2005  DATE OF DISCHARGE:                                HISTORY & PHYSICAL   PRIMARY CARE PHYSICIAN:  Dr. Maurice [name]   CHIEF COMPLAINT:  Increasing weakness in the left side of the body.   HISTORY OF PRESENT ILLNESS:  This is a 77 year old Caucasian female past  medical history of multiple problems presented with the above-mentioned  complaint.  The patient stated that for the last 2 months she has been  having weakness in her upper and lower extremities on and off which normally  comes for a few minutes and then resolves by self.  Last night this patient  started having left-sided weakness and she is not able to walk by herself or  use her left arm because of this weakness.  She was also complaining of  increasing numbness including her left lower extremity, left upper extremity  and left side of her face.  Yesterday was also complaining of slurred speech  and she said still her speech is a little bit slurred and it is not normal  as what it used to be.  Denied any blurred vision.  There is no urinary or  bowel incontinence.  There is no headache and there is no tremors or seizure  activity.  In the ER neurology was called by the ER attending and they  mention that since the patient is pass the window period for thrombolytics  so the patient can be admitted to medicine and neurology will follow as a  consult.  CT scan of the head was done and showed hypodense area in the  right thalamus that might represent subacute lacunar infarct otherwise it  was negative.   PAST MEDICAL HISTORY:  1. GERD.  2. Hypertension.  3. Hypercholesterolemia.  4. Depression.  5. Anxiety.  6. Osteoporosis.  7. Chronic pain  syndrome.   PAST SURGICAL HISTORY:  Status post right knee replacement.   MEDICATIONS:  1. Protonix 40 mg p.o. once daily.  2. Diclofenac twice daily p.r.n.  3. Omeprazole 40 mg p.o. once daily.  4. Cardura 4 mg p.o. once daily.  5. Zetia 10 mg p.o. once daily.  6. Fluoxetine 50 mg p.o. once daily.  7. Xanax 0.25 mg p.o. p.r.n.  8. Percocet three times daily p.r.n.  9. Multivitamin one tab p.o. once daily.  10.Calcium 600 mg p.o. twice daily.  11.Niacin 500 mg p.o. once daily.   ALLERGIES:  NO KNOWN DRUG ALLERGIES.   SOCIAL HISTORY:  Denies smoking.  Denies alcohol/drinking.  Denies  recreational drugs.   FAMILY HISTORY:  Family history is significant for hypertension.  There is  no coronary artery disease and no history of diabetes.   REVIEW OF SYSTEMS:  CONSTITUTIONAL:  There is no fever, no weight changes.  EYES:  No blurry vision, no pain,  no redness.  ENT:  No difficulty  swallowing.  There is slurred speech.  There is no epistaxis.  CARDIOVASCULAR:  No chest pain, no shortness of breath, no orthopnea, no  palpitations.  RESPIRATORY:  No cough, no dyspnea, no shortness of breath  and no hemoptysis.  ABDOMEN:  No nausea, no vomiting, no abdominal pain, no  diarrhea.  LOWER EXTREMITIES:  No edema, no rash.  SKIN:  No rash, no  erythema.  ID:  No rash, no lesions.  HEMATOLOGY:  No bleeding.  NEUROLOGY:  Positive for left-sided weakness, left-sided numbness and tingling, slurred  speech with decreased sensation in the left side.  The rest of systems were  reviewed and they were negative.   PHYSICAL EXAMINATION:  VITAL SIGNS:  Temperature is 97.8, blood pressure is  156/88, pulse is 67, respiratory rate is 18.  GENERAL APPEARANCE:  This is an elderly Caucasian female laying down in bed  in no acute distress at the present time.  HEENT:  Conjunctivae normal.  There is no pallor and no erythema.  Pupils  are equal and reactive to light and accommodation.  There is no ptosis.   Hearing is intact.  There is no ear discharge or  bleeding.  Oral mucosa is  moist and there is no pharyngeal erythema.  Neck is supple, no JVD, no  carotid bruit, no adenopathy, no thyroid enlargement or thyroid tenderness.  CARDIOVASCULAR EXAMINATION:  S1 and S2 is heard.  There is no additional  heart sounds  .  There is no murmurs, no gallops, or rubs.  RESPIRATORY EXAMINATION:  The patient is breathing between 16-18, no rales,  no rhonchi, no wheezes.  ABDOMINAL EXAMINATION:  Abdomen is soft, nondistended, nontender, no  hepatosplenomegaly, bowel sounds are normal.  LOWER EXTREMITIES:  No edema, no rash and no varicose veins.  SKIN:  No rash, no erythema.  NEUROLOGY:  Cranial nerves intact 2-12.  Strength is 2/5 in left lower  extremity, 2/5 in left upper extremity, 5/5 in both right upper and lower  extremities.  Sensation showed decreased sensation to pain and touch on the  left side.  Coordination is normal.  Gait cannot be assessed because the  patient is afraid to fall.   LABORATORY RESULTS:  Sodium 139, potassium 3.3, chloride 102, BUN 10,  hemoglobin 15.0, hematocrit 44.0.  The rest of the labs are pending.  CT  scan of the head without contrast showed negative bleeding with hypodense  focus in the right thalamus that might represent subacute or old lacunar  infarct.   ASSESSMENT:  1. Cerebrovascular accident.  The patient's symptoms are consistent with      acute cerebrovascular accident.  Because the patient had no symptoms      since yesterday she is not good candidate for thrombolytics.  The      patient was not deemed to be admitted under stroke basis.  Neurology      will see the patient.  We will get MRI and MRA of the brain.  We will      order aspirin 325 mg p.o. once daily.  We will get echocardiogram to      make sure there is no source of embolus in the heart.  We will also get     carotid Doppler to make sure there is no carotid blockage.  2. Hyperkalemia.  We  will give KCl and we will repeat the dose of      potassium.  We will  also get magnesium levels.  3. Hypertension.  We will continue current medications and we will try to      keep her blood pressure above 150 to maintain good saturation to the      brain in the setting of acute myocardial infarction.  4. Hypercholesterolemia.  Continue current medications.  5. GERD, continue medications.   Length of physician time is 50 minutes.      Joan Gardener, MD  Electronically Signed     NAE/MEDQ  D:  11/25/2005  T:  11/25/2005  Job:  [id number]   cc:   Gretta Arab. Valentina Lucks, M.D.

## 2010-09-20 NOTE — Consult Note (Signed)
[name], [name] NO.:  [id number]   MEDICAL RECORD NO.:  [id number]          PATIENT TYPE:  INP   LOCATION:  2902                         FACILITY:  [facility]   PHYSICIAN:  Deanna Artis. Hickling, M.D.DATE OF BIRTH:  May 26, 1925   DATE OF CONSULTATION:  11/25/2005  DATE OF DISCHARGE:                                   CONSULTATION   CHIEF COMPLAINT:  Left upper and lower extremity weakness and tingling.   HISTORY OF PRESENT ILLNESS:  The patient is a 77 year old right-handed  married woman who presented in the Emergency Department with recurrent left  upper and lower extremity weakness and tingling.  The patient's risk factors  for stroke include hypertension, dyslipidemia and intermittent weakness of  the left side for the past 2-3 months.  The patient fell backwards, hit her  head 2 weeks ago, but did not seek a physician evaluation.   The patient has had problems with not knowing where her left foot was.  The patient was at her baseline at home at 10 p.m., on the 23rd, when she  had onset of weakness in her left upper and lower extremity and tingling.  She went to bed and awakened at 8:30 this morning, without resolution of her  symptoms.  She was instructed by her physician to go to the [facility]  emergency room.  She arrived at 9 a.m.  The stroke team was called for  assessment.  Because of the duration of time between the onset of her  symptoms and the call, I declined to admit her and asked that she be  admitted by the [facility].  She is being assessed by me now  at 1636 hours.   PAST MEDICAL HISTORY:  Positive for:  1.  Hypertension.  2.  Cervical spinal stenosis.  3.  Osteoarthritis.  4.  Hypoglycemia.  5.  Polymyalgia rheumatica.  6.  Breast cancer.  7.  Right deep vein thrombosis.  8.  Hematoma formation, on Coumadin.  9.  Dyslipidemia.  10. Pulmonary fibrosis.   PAST SURGICAL HISTORY:  1.  Right knee arthroplasty November  2003.  2.  Appendectomy and tonsillectomy in the 1960s.  3.  Left mastectomy with radiation in 2002, without recurrence.   CURRENT MEDICATIONS:  1.  Protonix 40 mg daily.  2.  Cardura 4 mg 1/2 tablet daily.  3.  Toprol 50 mg 1/2 tablet daily.  4.  Fluoxetine 20 mg daily.  5.  Alprazolam 0.25 mg daily.  6.  Multivitamin daily.  7.  Calcium 600 mg 1-2 times daily.  8.  Oxycodone AP 5/325 1 every 8 hours as needed.  The patient is also on other medications including some form of fish oil,  another multivitamin and with Niacin and cranberry caps.   FAMILY HISTORY:  The patient's father died of endocarditis at the age 67.  Mother died of lung cancer at age 64.  Brother died of lung cancer at age  28.  She has 2 children living, alive and well, age 26 and 11.  No history  of TIAs or heart problems.  Other than her father's condition.   REVIEW OF SYSTEMS:  Positive for nausea last night and this morning.  No  vomiting.  Headache of 2 days duration, frontal.  No diplopia or difficulty  swallowing, dysarthria, weakness on the right side.  The patient has had  abdominal and umbilical hernia.  No diarrhea, melena, hematochezia.  She  wheezes when she is active physically.  Total system review otherwise  negative.   SOCIAL HISTORY:  The patient lives with her husband.  She does not use  alcohol, drugs, nor does she smoke.  She graduated from high school and is a  retired [employer] operational officer.   EXAMINATION:  GENERAL:  A very pleasant woman in no acute distress.  VITAL SIGNS:  Temperature 96.3, blood pressure 156/88, resting pulse 65,  respirations 20, oxygen saturation 98%.  HEENT:  Head, eyes, ears, nose and throat:  No signs of infection.  NECK:  Supple.  Full range of motion.  No cranial or cervical bruits.  LUNGS:  Clear to auscultation.  HEART:  No murmurs.  Pulse is normal.  ABDOMEN:  Soft, nontender, protuberant.  Bowel sounds normal.  No  hepatosplenomegaly.  EXTREMITIES:  Well-formed,  without edema, cyanosis or altered tone.  NEUROLOGIC EXAMINATION:  This patient's NIH stroke scale was 3, with 1 for  hypesthesia and a slight arm and leg drift.   Mini-mental state is 23/30, 3/5 with serial sevens and 2/3 for recall.  The  patient is otherwise fully alert and oriented.  She follows commands.  She  has no dysphasia or dyspraxia.  Cranial nerves, round, reactive pupils,  normal fundi.  Full facial feel to double simultaneous stimuli.  Extraocular  movements full and conjugate.  Symmetric facial strength.  She has mild  hypesthesia in her left face, but this is somewhat variable.  Extraocular  movements are full.  Midline tongue and uvula.  Air conduction greater than  normal conduction bilaterally.   Motor examination:  On the right side, she is 5/5, other than her deltoids  bilaterally, which are painful and are no better than 4/5 (rotator cuff  injuries).  She has much better fine motor movements in the right hand,  which is quite facile.  The left hand is quite clumsy.  She is able to  appose her thumb and fingers bilaterally.   In lower extremities, she is 4+/5.  In the right hip flexor, she is 4-/5 and  left hip flexor, she is 5/5 and so are knee flexors and extensors,  dorsiflexors and plantar flexors bilaterally.  Sensation shows a left  hemihypesthesia.  She does not extinguish the left side to double  simultaneous stimuli.  She had good stereognosis.  Cerebellar examination:  Good finger to nose on the right and the left.  She has a tremor and a  dysmetria on the left arm, but I believe this is a sensory rather than  cerebellar dystaxia because she has problems with coordination throughout  the path, rather than just the endpoint.   Deep tendon reflexes were I thought, diminished.  I did not see a reflex  predominance, but this is noted by my student.  Cerebellar examination noted  above.  The patient's gait was not tested.  CT scan of the brain shows a  hypodensity area in the left thalamus.  This  could be subacute.  I doubt that it is remote.  An MRI scan will be very  helpful to provide evidence for this.  There is mild  atrophy in the  background, but otherwise it is fairly unremarkable.   IMPRESSION:  Right brain stroke, thalamic in location, lacunar small vessel  disease.  The patient has risk factors for this including hypertension,  dyslipidemia.  She is not on any platelet medications at this time.   PLAN:  The patient will have MRI of the brain, MRA intracranial, carotid  Doppler, 2D echocardiogram, serum fasting lipid panel, hemoglobin A1c and  homocysteine.  We will start her on aspirin 325 mg per day.  The patient  will need to have physical and occupational therapy evaluation.  She has had  a swallowing study and passed it, so she will be able to take medications  and food and will not need speech therapy.  Appreciate the opportunity to  participate in her care.  If you have questions about this or if I can be of  assistance, do not hesitate to contact me.      Deanna Artis. Sharene Skeans, M.D.  Electronically Signed     [facility]/MEDQ  D:  11/25/2005  T:  11/25/2005  Job:  [id number]   cc:   Gretta Arab. Valentina Lucks, M.D.  Fax: [phone]

## 2010-09-20 NOTE — Discharge Summary (Signed)
NAMELAIAH, POUNCEY NO.:  [id number]   MEDICAL RECORD NO.:  [id number]          PATIENT TYPE:  IPS   LOCATION:  4148                         FACILITY:  [facility]   PHYSICIAN:  Mariam Dollar, P.A.  DATE OF BIRTH:  11-14-1925   DATE OF ADMISSION:  12/01/2005  DATE OF DISCHARGE:                                 DISCHARGE SUMMARY   ADMITTED:  December 01, 2005   DISCHARGED:  December 09, 2005   DISCHARGE DIAGNOSES:  1.  Right parietal thalamic infarction.  2.  Chronic pain management.  3.  Subcutaneous Lovenox for deep vein thrombosis prophylaxis.  4.  Non-insulin dependent diabetes mellitus.  5.  Polymyalgia rheumatica.  6.  Hypertension.  7.  Depression.  8.  Hyperlipidemia.   This is a 77 year old, right-handed female with history of polymyalgia  rheumatica, who was admitted November 25, 2005, with left-sided weakness.  MRI  with infarction right thalamus.  MRA with moderate stenosis left PCA.  Carotid duplex negative.  Echocardiogram with ejection fraction 60% without  emboli.  Neurology services Dr. Sharene Skeans placed on aspirin and subcutaneous  Lovenox.  Remained on methadone as well as a Lidoderm patch for polymyalgia.  Findings of elevated hemoglobin A1c 7.5, placed on low-dose of glipizide and  monitored.  She was admitted for comprehensive rehab program.   PAST MEDICAL HISTORY:  See discharge diagnoses.  No alcohol or tobacco.   ALLERGIES:  None.   SOCIAL HISTORY:  Lives with her husband and assistance is needed, one-level  home, 1 step entry.   MEDICATIONS PRIOR TO ADMISSION:  1.  Protonix 40 mg daily.  2.  Prilosec daily.  3.  Cardura 4 mg daily.  4.  Zetia 10 mg daily.  5.  Diclofenac twice daily.  6.  Methadone 5 mg 3 times daily.  7.  Prozac 20 mg daily.  8.  Alprazolam 0.25 mg daily.  9.  Oxycodone as needed.  10. Multivitamin daily.   REHABILITATION HOSPITAL COURSE:  Patient was admitted to inpatient rehab  services with therapies initiated on  a 3-hour daily basis consisting of  physical therapy, occupational therapy, speech therapy, and rehabilitation  nursing.  The following issues were addressed during patient's  rehabilitation stay.  Pertaining to Ms. Hillier's right parietal thalamic  infarction, remained on aspirin therapy.  Subcutaneous Lovenox had been  initiated for deep vein thrombosis prophylaxis.  She was minimal assist to  supervision overall for activities of daily living of function and mobility,  bathing and dressing.  She was advised no driving.  Subcutaneous Lovenox was  discontinued at time of discharge.  She remained on her methadone for  history of polymyalgia.  She would follow up with her primary care Shubham Thackston.  Her blood pressures remained well-controlled without the use of  antihypertensive medications with diastolic pressures of 70-81.  She denied  any headache dizziness or shortness of breath.  During her acute care stay,  noted hemoglobin A1c of 7.5, blood sugars 87, 117 and 130.  She had been  placed on low doses of glipizide 5 mg daily as well as diabetic diet  with  full teaching concerning her diabetic care.  Noted some increased shoulder  pain felt to be due to her polymyalgia.  She did receive steroid injection  on August 1 '07 with relative good results.   Latest labs showed a sodium 139, potassium 3.6, BUN 11, creatinine 0.7,  hemoglobin 13.4, hematocrit 38.5, platelet 202,000.   DISCHARGE MEDICATIONS TIME OF DICTATION:  1.  Xanax 0.25 mg daily.  2.  Multivitamin daily.  3.  Methadone 10 mg 3 times daily.  4.  Glipizide 5 mg daily.  5.  Zetia 10 mg daily.  6.  Protonix 40 mg daily.  7.  Cardura 2 mg daily.  8.  Prozac 20 mg daily.  9.  Aspirin 325 mg daily.  10. Os-Cal 500 mg daily.  11. Niacin 500 mg at bedtime.  12. Toprol XL 50 mg daily.  13. Lidoderm patch changed every 12 hours.  14. Oxycodone as needed for breakthrough pain.   Activity as tolerated.  Advised no driving.   Diet was a diabetic diet.   SPECIAL INSTRUCTIONS:  Followup with Dr. Claudette Laws at the [facility] as advised, Dr. Sharene Skeans, Neurology Services, Dr.  Maurice Small, medical management.      Mariam Dollar, P.A.     DA/MEDQ  D:  12/08/2005  T:  12/08/2005  Job:  [id number]   cc:   Erick Colace, M.D.  Deanna Artis. Sharene Skeans, M.D.  Gretta Arab Valentina Lucks, M.D.

## 2010-09-20 NOTE — Assessment & Plan Note (Signed)
[facility]                               PULMONARY OFFICE NOTE   NAME:[name]                     MRN:          [id number]  DATE:01/14/2006                            DOB:          [birth year]    [name] is [name] 77 year old white female, history of microaspiration  with interstitial fibrosis, reflux disease.  Her breathing is doing well.  She has only [name] minimal cough.  She is on the omeprazole now 20 mg b.i.d.  Reflux symptoms well-controlled.  Other maintenance medicines unchanged  since the last visit with the exception that she is only using oxycodone  p.r.n.  she is off the niacin compound.   PHYSICAL EXAMINATION:  VITAL SIGNS:  Temperature 97, blood pressure 120/87,  pulse 71, saturation 96% in room air.  CHEST:  Dry rales at the bases, otherwise clear.  CARDIAC:  [name] regular rate and rhythm without S3.  Normal S1, S2.  ABDOMEN:  Soft, nontender.  EXTREMITIES:  No edema or clubbing.  SKIN:  Clear.  NEUROLOGIC:  Intact.  HEENT:  No jugular venous distention, no lymphadenopathy.  Oropharynx clear.  NECK:  Supple.   IMPRESSION:  Idiopathic pulmonary fibrosis, usual interstitial pneumonitis  type, due to chronic microaspiration, stable at this time.   PLAN:  Maintain off systemic steroids and see the patient back in follow-up  __________.                                   [name], MD, FCCP   PEW/MedQ  DD:  01/14/2006  DT:  01/15/2006  Job #:  [id number]

## 2010-09-20 NOTE — Assessment & Plan Note (Signed)
[facility]                             PULMONARY OFFICE NOTE   NAME:Kneebone, Larcenia A                     MRN:          [id number]  DATE:07/07/2006                            DOB:          [birth year]    Ms. Oertel is a 77 year old white female, history of idiopathic  pulmonary fibrosis on the basis of microaspiration reflux disease.  She  has had a dry cough now for two months, but she has incidentally been  off her omeprazole for 20 mg twice daily, because her insurance company  would not provide it for her.   EXAM:  Temperature is   DICTATION ENDED AT THIS POINT.     [name], MD, [facility]  Electronically Signed    PEW/MedQ  DD: 07/08/2006  DT: 07/08/2006  Job #: [id number]   cc:   Gretta Arab. Valentina Lucks, M.D.

## 2010-09-20 NOTE — Assessment & Plan Note (Signed)
[name]                             PULMONARY OFFICE NOTE   NAME:[name], [name]                     MRN:          [id number]  DATE:07/07/2006                            DOB:          [birth year]    [name] is [name] 77 year old white female with [name] history of  microaspiration with associated interstitial fibrosis on the basis of  reflux disease.  We then attempted to maintain Omeprazole at 20 mg  b.i.d. and reflux was well controlled, however, her managed care company  would not pay for the Omeprazole, and she was not able to refill it.  She has been off this now for several months and is coughing again,  having more dyspnea.  She is using over-the-counter ranitidine 150 mg  b.i.d. without much improvement.  Currently, she is on calcium two  daily, multivitamin daily, Toprol-XL 25 mg daily, and this is the only  medication she is really actively taking.   PHYSICAL EXAMINATION:  VITAL SIGNS:  Temperature 97, blood pressure  124/80, pulse 85, saturation 95% on room air.  CHEST:  Dry rales at the bases.  No wheeze or rhonchi.  CARDIAC:  Regular rate and rhythm without S3.  Normal S1 and S2.  ABDOMEN:  Soft, nontender.  EXTREMITIES:  No edema or clubbing.  SKIN:  Clear.  NEUROLOGIC:  Intact.  HEENT:  No jugular venous distention, no lymphadenopathy.  Oropharynx  clear.  NECK:  Supple.   IMPRESSION:  Interstitial fibrosis on the basis of microaspiration from  reflux disease.  It is absolutely imperative this patient receive proton  pump inhibitors on [name] b.i.d. dosing basis.  It borders on malpractice for  this patient to not be allowed to receive this from her managed care  company.  We will try one more time to get [name] prior authorization  approval for this patient.  In the interim, I have given her samples of  Zegerid she will use at 40 mg daily, and will see the patient back in  follow up in [name] months' time.     [name], MD, [facility]  Electronically Signed    PEW/MedQ  DD: 07/07/2006  DT: 07/08/2006  Job #: [id number]   cc:   Gretta Arab. Valentina Lucks, M.D.

## 2011-01-22 LAB — CBC
HCT: 29.1 — ABNORMAL LOW
HCT: 41.8
Hemoglobin: 10.2 — ABNORMAL LOW
Hemoglobin: 11.1 — ABNORMAL LOW
Hemoglobin: 14.8
MCHC: 35.2
MCV: 87.2
MCV: 87.7
Platelets: 161
Platelets: 196
Platelets: 240
RBC: 3.31 — ABNORMAL LOW
RDW: 13.8
RDW: 13.9
RDW: 14
WBC: 7.3
WBC: 9

## 2011-01-22 LAB — COMPREHENSIVE METABOLIC PANEL
Albumin: 3.7
Alkaline Phosphatase: 80
BUN: 14
CO2: 26
Chloride: 92 — ABNORMAL LOW
Creatinine, Ser: 0.82
GFR calc non Af Amer: >60
Glucose, Bld: 411 — ABNORMAL HIGH
Potassium: 3.7
Total Bilirubin: 0.7

## 2011-01-22 LAB — DIFFERENTIAL
Basophils Absolute: 0
Basophils Relative: 1
Lymphocytes Relative: 19
Monocytes Absolute: 0.8
Neutro Abs: 5.3
Neutrophils Relative %: 66

## 2011-01-22 LAB — ABO/RH: ABO/RH(D): A POS

## 2011-01-22 LAB — URINE MICROSCOPIC-ADD ON

## 2011-01-22 LAB — BASIC METABOLIC PANEL
BUN: 7
Calcium: 8 — ABNORMAL LOW
GFR calc non Af Amer: >60
Glucose, Bld: 219 — ABNORMAL HIGH
Potassium: 3.2 — ABNORMAL LOW
Sodium: 131 — ABNORMAL LOW

## 2011-01-22 LAB — TYPE AND SCREEN: Antibody Screen: NEGATIVE

## 2011-01-22 LAB — URINALYSIS, ROUTINE W REFLEX MICROSCOPIC
Bilirubin Urine: NEGATIVE
Ketones, ur: NEGATIVE
Nitrite: NEGATIVE
Protein, ur: NEGATIVE
pH: 6

## 2011-01-22 LAB — PROTIME-INR
INR: 0.9
INR: 1
INR: 1.3
Prothrombin Time: 12.4
Prothrombin Time: 13.5
Prothrombin Time: 16.1 — ABNORMAL HIGH

## 2011-01-22 LAB — APTT: aPTT: 29

## 2011-01-22 LAB — HEMOGLOBIN A1C: Mean Plasma Glucose: 282

## 2011-01-23 LAB — CBC
Platelets: 188
RDW: 14
WBC: 8

## 2011-01-23 LAB — PROTIME-INR
INR: 1.5
Prothrombin Time: 18.4 — ABNORMAL HIGH
Prothrombin Time: 20.5 — ABNORMAL HIGH

## 2011-08-26 ENCOUNTER — Other Ambulatory Visit: Payer: Self-pay | Admitting: Family Medicine

## 2011-08-26 DIAGNOSIS — R748 Abnormal levels of other serum enzymes: Secondary | ICD-10-CM

## 2011-08-28 ENCOUNTER — Other Ambulatory Visit: Payer: Self-pay | Admitting: Family Medicine

## 2011-08-28 ENCOUNTER — Ambulatory Visit (HOSPITAL_COMMUNITY)
Admission: RE | Admit: 2011-08-28 | Discharge: 2011-08-28 | Disposition: A | Discharge status: Home / Self Care | Payer: Medicare Other | Source: Ambulatory Visit | Attending: Family Medicine | Admitting: Family Medicine

## 2011-08-28 ENCOUNTER — Other Ambulatory Visit (HOSPITAL_COMMUNITY): Payer: Self-pay

## 2011-08-28 DIAGNOSIS — K7689 Other specified diseases of liver: Secondary | ICD-10-CM | POA: Insufficient documentation

## 2011-08-28 DIAGNOSIS — R748 Abnormal levels of other serum enzymes: Secondary | ICD-10-CM

## 2012-07-23 ENCOUNTER — Telehealth: Payer: Self-pay

## 2012-07-23 NOTE — Telephone Encounter (Signed)
Pt aware of labs  

## 2012-07-23 NOTE — Telephone Encounter (Signed)
Pt notified to stop the bactrim

## 2012-07-23 NOTE — Telephone Encounter (Signed)
Pt called to report that she is on sulfa for UTI and has been on for 4 days and is having some vomiting. She thinks its from the med. Please advise . [facility]

## 2012-07-23 NOTE — Telephone Encounter (Signed)
Just stop the bactrim and see if she get better by tomorrow morning.

## 2012-08-02 ENCOUNTER — Ambulatory Visit (INDEPENDENT_AMBULATORY_CARE_PROVIDER_SITE_OTHER): Payer: Medicare Other | Admitting: Family Medicine

## 2012-08-02 ENCOUNTER — Encounter: Payer: Self-pay | Admitting: Family Medicine

## 2012-08-02 VITALS — BP 120/74 | HR 74 | Temp 98.6°F | Wt 161.4 lb

## 2012-08-02 DIAGNOSIS — R350 Frequency of micturition: Secondary | ICD-10-CM

## 2012-08-02 DIAGNOSIS — IMO0001 Reserved for inherently not codable concepts without codable children: Secondary | ICD-10-CM | POA: Insufficient documentation

## 2012-08-02 DIAGNOSIS — I10 Essential (primary) hypertension: Secondary | ICD-10-CM | POA: Insufficient documentation

## 2012-08-02 DIAGNOSIS — E785 Hyperlipidemia, unspecified: Secondary | ICD-10-CM | POA: Insufficient documentation

## 2012-08-02 DIAGNOSIS — E119 Type 2 diabetes mellitus without complications: Secondary | ICD-10-CM | POA: Insufficient documentation

## 2012-08-02 DIAGNOSIS — N390 Urinary tract infection, site not specified: Secondary | ICD-10-CM | POA: Insufficient documentation

## 2012-08-02 LAB — BASIC METABOLIC PANEL WITH GFR
BUN: 18 mg/dL (ref 6–23)
CO2: 27 mEq/L (ref 19–32)
Calcium: 9.2 mg/dL (ref 8.4–10.5)
Chloride: 105 mEq/L (ref 96–112)
Creat: 0.83 mg/dL (ref 0.50–1.10)
GFR, Est African American: 75 mL/min
GFR, Est Non African American: 65 mL/min
Glucose, Bld: 112 mg/dL — ABNORMAL HIGH (ref 70–99)
Potassium: 4.3 mEq/L (ref 3.5–5.3)
Sodium: 141 mEq/L (ref 135–145)

## 2012-08-02 LAB — POCT URINALYSIS DIPSTICK
Bilirubin, UA: NEGATIVE
Blood, UA: NEGATIVE
Glucose, UA: NEGATIVE
Ketones, UA: NEGATIVE
Nitrite, UA: NEGATIVE
Protein, UA: NEGATIVE
Spec Grav, UA: 1.01
Urobilinogen, UA: NEGATIVE
pH, UA: 6

## 2012-08-02 LAB — POCT UA - MICROSCOPIC ONLY
Bacteria, U Microscopic: NEGATIVE
Casts, Ur, LPF, POC: NEGATIVE
Crystals, Ur, HPF, POC: NEGATIVE
Mucus, UA: NEGATIVE
RBC, urine, microscopic: NEGATIVE
Yeast, UA: NEGATIVE

## 2012-08-02 LAB — HEPATIC FUNCTION PANEL
ALT: 26 U/L (ref 0–35)
AST: 22 U/L (ref 0–37)
Albumin: 4.2 g/dL (ref 3.5–5.2)
Alkaline Phosphatase: 44 U/L (ref 39–117)
Bilirubin, Direct: <0.1 mg/dL (ref 0.0–0.3)
Total Bilirubin: 0.2 mg/dL — ABNORMAL LOW (ref 0.3–1.2)
Total Protein: 6.6 g/dL (ref 6.0–8.3)

## 2012-08-02 LAB — POCT GLYCOSYLATED HEMOGLOBIN (HGB A1C): Hemoglobin A1C: 6.2

## 2012-08-02 NOTE — Assessment & Plan Note (Signed)
No headache chest pain palpitations or pedal edema. She brought her blood pressure readings never been satisfactory systolic in the 120s 130s some rarely up to 140.

## 2012-08-02 NOTE — Assessment & Plan Note (Signed)
No problem with her present medications.

## 2012-08-02 NOTE — Progress Notes (Signed)
Patient ID: [name], female   DOB: 1925-12-11, 79 y.o.   MRN: [id number] SUBJECTIVE:   HPI: Patient comes in for follow up for a UTI. She is given a urine sample. She is asymptomatic. No fever chills or backache. Hypertension has been stable and well controlled. No symptoms associated with her past history of CVA. Her diabetes has been well controlled for her CBGs run in the 90s and occasional low 100s. Temperature results and her blood pressure readings. Hyperlipidemia no problem in her medications. Postinflammatory pulmonary fibrosis. No respiratory symptoms  Past Medical History  Diagnosis Date  . Diabetes mellitus without complication   . Hyperlipidemia   . Hypertension    Past Surgical History  Procedure Laterality Date  . Appendectomy    . Cholecystectomy    . Tonsillectomy    . Joint replacement      bilat knee    No current outpatient prescriptions on file prior to visit.   No current facility-administered medications on file prior to visit.   Allergies  Allergen Reactions  . Ace Inhibitors   . Sulfa Antibiotics Nausea And Vomiting   Immunization History  Administered Date(s) Administered  . Influenza Whole 03/07/2005   History   Social History  . Marital Status: Married    Spouse Name: N/A    Number of Children: N/A  . Years of Education: N/A   Occupational History  . Not on file.   Social History Main Topics  . Smoking status: Never Smoker   . Smokeless tobacco: Not on file  . Alcohol Use: No  . Drug Use: No  . Sexually Active: Not on file   Other Topics Concern  . Not on file   Social History Narrative  . No narrative on file    Review of systems no other new symptomatology.  OBJECTIVE:    Patient in no acute distress. Short stature obese VS:BP 120/74  Pulse 74  Temp(Src) 98.6 F (37 C) (Oral)  Wt 161 lb 6.4 oz (73.211 kg) Kyphotic SKIN: warm and  Dry without overt rashes. HEAD and Neck: without JVD, Normal LUNGS: Clear CVS:  Regular rhythm, normal sounds, and absence of murmurs, rubs or gallops. ABDOMEN: Benign,, no organomegaly, no masses, no Abdominal Aortic enlargement. EXTREMETIES: nonedematous. NEUROLOGIC: oriented to time,place and person; nonfocal Patient is able to ambulate with a cane. She is unsteady without it.  ASSESSMENT:   UTI (urinary tract infection) Currently asymptomatic. Given a urine sample results on chart. Found to have leukocytes in urine was sent for urine culture and await the results.  HYPERTENSION No headache chest pain palpitations or pedal edema. She brought her blood pressure readings never been satisfactory systolic in the 120s 130s some rarely up to 140.   HYPERLIPIDEMIA No problem with her present medications.  DM (diabetes mellitus) Patient brought to the CBGs in the morning never been running in 90s and low 100. Reflective of very good control.    PLAN:  Orders Placed This Encounter  Procedures  . Urine culture  . BASIC METABOLIC PANEL WITH GFR  . Hepatic function panel  . NMR Lipoprofile with Lipids  . POCT urinalysis dipstick  . POCT UA - Microscopic Only  . POCT glycosylated hemoglobin (Hb A1C)    Meds ordered this encounter  Medications  . amLODipine (NORVASC) 2.5 MG tablet    Sig:   . diltiazem (CARDIZEM) 120 MG tablet    Sig:   . imipramine (TOFRANIL) 10 MG tablet    Sig:   .  metFORMIN (GLUCOPHAGE) 1000 MG tablet    Sig:   . omeprazole (PRILOSEC) 20 MG capsule    Sig:   . Aspirin-Caffeine (BAYER BACK & BODY PAIN EX ST PO)    Sig: Take by mouth.  . Calcium Carbonate-Vit D-Min (CALCIUM 1200 PO)    Sig: Take 1 tablet by mouth 2 (two) times daily.  Marland Kitchen MAGNESIUM-ZINC PO    Sig: Take 15 mg by mouth 3 (three) times daily.  . rosuvastatin (CRESTOR) 5 MG tablet    Sig: Take 5 mg by mouth daily.  Marland Kitchen olmesartan-hydrochlorothiazide (BENICAR HCT) 20-12.5 MG per tablet    Sig: Take 1 tablet by mouth daily.  . insulin glargine (LANTUS) 100 UNIT/ML injection     Sig: Inject 30 Units into the skin every evening.   . And keep active and eat a healthy balanced meal we discussed "prepackaged food and high salt and carbohydrates/sugar content. She has seen an effect of this and her blood sugar with a new package sweetened oatmeal.  She'll be aware of food sheet.  [name] P. Modesto Charon, M.D.

## 2012-08-02 NOTE — Assessment & Plan Note (Signed)
Currently asymptomatic. Given a urine sample results on chart. Found to have leukocytes in urine was sent for urine culture and await the results.

## 2012-08-02 NOTE — Assessment & Plan Note (Signed)
Patient brought to the CBGs in the morning never been running in 90s and low 100. Reflective of very good control.

## 2012-08-03 LAB — URINE CULTURE
Colony Count: NO GROWTH
Organism ID, Bacteria: NO GROWTH

## 2012-08-03 LAB — NMR LIPOPROFILE WITH LIPIDS
Cholesterol, Total: 148 mg/dL (ref <200)
HDL Particle Number: 30.8 umol/L (ref >=30.5)
HDL Size: 9.4 nm (ref >=9.2)
HDL-C: 47 mg/dL (ref >=40)
LDL (calc): 77 mg/dL (ref <100)
LDL Particle Number: 1052 nmol/L — ABNORMAL HIGH (ref <1000)
LDL Size: 20.4 nm — ABNORMAL LOW (ref >20.5)
LP-IR Score: 44 (ref <=45)
Large HDL-P: 4.6 umol/L — ABNORMAL LOW (ref >=4.8)
Large VLDL-P: 2.8 nmol/L — ABNORMAL HIGH (ref <=2.7)
Small LDL Particle Number: 530 nmol/L — ABNORMAL HIGH (ref <=527)
Triglycerides: 119 mg/dL (ref <150)
VLDL Size: 43.3 nm (ref >=46.6)

## 2012-08-03 NOTE — Progress Notes (Signed)
Quick Note:  Lab result at goal. No change in Medications for now. ______ 

## 2012-08-12 ENCOUNTER — Other Ambulatory Visit: Payer: Self-pay | Admitting: Family Medicine

## 2012-08-12 DIAGNOSIS — N390 Urinary tract infection, site not specified: Secondary | ICD-10-CM

## 2012-08-12 MED ORDER — CIPROFLOXACIN HCL 500 MG PO TABS: 500.0000 mg | ORAL_TABLET | Freq: Two times a day (BID) | ORAL | Status: DC

## 2012-08-17 ENCOUNTER — Telehealth: Payer: Self-pay | Admitting: Family Medicine

## 2012-08-17 DIAGNOSIS — N3281 Overactive bladder: Secondary | ICD-10-CM

## 2012-08-17 DIAGNOSIS — N390 Urinary tract infection, site not specified: Secondary | ICD-10-CM

## 2012-08-17 NOTE — Telephone Encounter (Signed)
Spoke with pt -questions answered about meloxicam and referral to [facility] for urology

## 2012-08-17 NOTE — Telephone Encounter (Signed)
Patient wants a referral to Dr. Annabell Howells

## 2012-08-24 ENCOUNTER — Ambulatory Visit (INDEPENDENT_AMBULATORY_CARE_PROVIDER_SITE_OTHER): Payer: Medicare Other | Admitting: Family Medicine

## 2012-08-24 ENCOUNTER — Encounter: Payer: Self-pay | Admitting: Family Medicine

## 2012-08-24 VITALS — BP 130/71 | HR 92 | Temp 99.1°F

## 2012-08-24 DIAGNOSIS — L659 Nonscarring hair loss, unspecified: Secondary | ICD-10-CM

## 2012-08-24 DIAGNOSIS — I10 Essential (primary) hypertension: Secondary | ICD-10-CM

## 2012-08-24 DIAGNOSIS — IMO0001 Reserved for inherently not codable concepts without codable children: Secondary | ICD-10-CM

## 2012-08-24 DIAGNOSIS — E785 Hyperlipidemia, unspecified: Secondary | ICD-10-CM

## 2012-08-24 DIAGNOSIS — E119 Type 2 diabetes mellitus without complications: Secondary | ICD-10-CM

## 2012-08-24 DIAGNOSIS — N390 Urinary tract infection, site not specified: Secondary | ICD-10-CM

## 2012-08-24 DIAGNOSIS — R350 Frequency of micturition: Secondary | ICD-10-CM

## 2012-08-24 DIAGNOSIS — K219 Gastro-esophageal reflux disease without esophagitis: Secondary | ICD-10-CM

## 2012-08-24 LAB — POCT URINALYSIS DIPSTICK
Bilirubin, UA: NEGATIVE
Blood, UA: NEGATIVE
Glucose, UA: NEGATIVE
Ketones, UA: NEGATIVE
Leukocytes, UA: NEGATIVE
Nitrite, UA: NEGATIVE
Protein, UA: NEGATIVE
Spec Grav, UA: 1.015
Urobilinogen, UA: NEGATIVE
pH, UA: 5

## 2012-08-24 MED ORDER — CIPROFLOXACIN HCL 250 MG PO TABS: 250.0000 mg | ORAL_TABLET | Freq: Every morning | ORAL | Status: DC

## 2012-08-24 MED ORDER — DILTIAZEM HCL 120 MG PO TABS: 120.0000 mg | ORAL_TABLET | Freq: Two times a day (BID) | ORAL | Status: DC

## 2012-08-24 NOTE — Progress Notes (Signed)
Patient ID: [name], female   DOB: 04-29-1926, 79 y.o.   MRN: [id number] SUBJECTIVE: HPI: Duration: has had previous history of UTI Number of UTI in last Year: has not had symptoms of burning with urination has not had odor to urine has not had cloudiness of urine has not had blood in the urine has not had history of kidney stones has not had history of sexually transmitted  Diseases or exposure to STD's. has not had Vaginal discharge has not had Urinary tract infections postcoitally has not had flank or back pain has not had fever  Medications tried:cipro has had an effective response.  Her to recheck the recurrent UTI and conside suppressive therapy. Brought BP readings a s well.BPs 115/84 to rare 171/86   PMH/PSH: reviewed/updated in Epic  SH/FH: reviewed/updated in Epic  Allergies: reviewed/updated in Epic  Medications: reviewed/updated in Epic  Immunizations: reviewed/updated in Epic  ROS: As above in the HPI. All other systems are stable or negative.  OBJECTIVE: APPEARANCE:  Patient in no acute distress.The patient appeared well nourished and normally developed. Acyanotic. Waist: VITAL SIGNS:BP 130/71  Pulse 92  Temp(Src) 99.1 F (37.3 C) (Oral)   SKIN: warm and  Dry without overt rashes, tattoos and scarshair thinning and eyebrows thinning.  HEAD and Neck: without JVD, Head and scalp: normal Eyes:No scleral icterus. Fundi normal, eye movements normal. Ears: Auricle normal, canal normal, Tympanic membranes normal, insufflation normal. Nose: normal Throat: normal Neck & thyroid: normal  CHEST & LUNGS: Chest wall: normal Lungs: Clear  CVS: Reveals the PMI to be normally located. Regular rhythm, First and Second Heart sounds are normal,  absence of murmurs, rubs or gallops. Peripheral vasculature: Radial pulses: normal Dorsal pedis pulses: normal Posterior pulses: normal  ABDOMEN:  Appearance: normal Benign,, no organomegaly, no  masses, no Abdominal Aortic enlargement. No Guarding , no rebound. No Bruits. Bowel sounds: normal  RECTAL: N/A GU: N/A  EXTREMETIES: nonedematous. Both Femoral and Pedal pulses are normal.  MUSCULOSKELETAL:  Ambulates with a cane.  ASSESSMENT: Frequency - Plan: POCT urinalysis dipstick  DM  UTI (urinary tract infection) - recurrent - Plan: ciprofloxacin (CIPRO) 250 MG tablet  HYPERLIPIDEMIA  HTN (hypertension) - Plan: diltiazem (CARDIZEM) 120 MG tablet  GERD  Alopecia - Plan: Thyroid Panel With TSH  Recurrent UTI resolved. Once every month for last 6 months. Will suppress with low dose daily cipro based on sensitivities. Keep follow up in 2 months routine. Will check thyroid because of hair loss and eyebrow thinning.  PLAN: Orders Placed This Encounter  Procedures  . Thyroid Panel With TSH  . POCT urinalysis dipstick   Results for orders placed in visit on 08/24/12 (from the past 24 hour(s))  POCT URINALYSIS DIPSTICK     Status: None   Collection Time    08/24/12  3:02 PM      Result Value Range   Color, UA Gold     Clarity, UA Clear     Glucose, UA neg     Bilirubin, UA neg     Ketones, UA neg     Spec Grav, UA 1.015     Blood, UA neg     pH, UA 5.0     Protein, UA neg     Urobilinogen, UA negative     Nitrite, UA neg     Leukocytes, UA Negative     Meds ordered this encounter  Medications  . ciprofloxacin (CIPRO) 250 MG tablet    Sig: Take 1  tablet (250 mg total) by mouth every morning. For Recurrent UTI suppression    Dispense:  30 tablet    Refill:  1  . diltiazem (CARDIZEM) 120 MG tablet    Sig: Take 1 tablet (120 mg total) by mouth 2 (two) times daily.    Dispense:  60 tablet    Refill:  5   suppressive Antibiotic therapy. Risks - benefits  Discussed. Continue meds. Reviewed other meds.  RTc in 2 months  Naziah Weckerly P. Modesto Charon, M.D.

## 2012-08-25 ENCOUNTER — Telehealth: Payer: Self-pay | Admitting: Family Medicine

## 2012-08-25 ENCOUNTER — Encounter: Payer: Self-pay | Admitting: *Deleted

## 2012-08-25 LAB — THYROID PANEL WITH TSH
Free Thyroxine Index: 2.5 (ref 1.0–3.9)
T3 Uptake: 34.2 % (ref 22.5–37.0)
T4, Total: 7.2 ug/dL (ref 5.0–12.5)
TSH: 0.701 u[IU]/mL (ref 0.350–4.500)

## 2012-08-25 NOTE — Progress Notes (Signed)
Quick Note:  Call patient. Labs normal. No change in plan. Hair loss secondary to aging and hormonal changes. Not due to thyroid problem. No Change in plans. ______

## 2012-08-25 NOTE — Telephone Encounter (Signed)
Per Dr Modesto Charon advised patient that there should not be that big of a difference in hands. Possibly an error on her part or a problem with the strip. Patient is feeling fine. No signs of hypoglycemia. Advised pt to check BS again in a little while in both hands and if continues to be a big difference to give [place] a call back. Pt verbalized understanding

## 2012-09-17 ENCOUNTER — Other Ambulatory Visit: Payer: Self-pay | Admitting: Family Medicine

## 2012-09-22 ENCOUNTER — Telehealth: Payer: Self-pay | Admitting: Family Medicine

## 2012-09-22 NOTE — Telephone Encounter (Signed)
Lets increase the imipramine she takes to 3 tablets at nights and see if that works.

## 2012-09-22 NOTE — Telephone Encounter (Signed)
Reduce the lantus to 20 Units at nights. The cipro would interfere with the sugars and makes it lower. If her sugars go back up over the next few weeks then may need to increase back the lantus.

## 2012-09-22 NOTE — Telephone Encounter (Signed)
Patient notified and verbalized understanding. 

## 2012-10-18 ENCOUNTER — Telehealth: Payer: Self-pay | Admitting: Family Medicine

## 2012-10-21 NOTE — Telephone Encounter (Signed)
Needs to be seen

## 2012-10-22 NOTE — Telephone Encounter (Signed)
Pt will wait for appt that is already scheduled on Tues 7/8

## 2012-10-26 ENCOUNTER — Other Ambulatory Visit: Payer: Self-pay | Admitting: Family Medicine

## 2012-10-27 ENCOUNTER — Other Ambulatory Visit: Payer: Self-pay | Admitting: Family Medicine

## 2012-10-27 ENCOUNTER — Telehealth: Payer: Self-pay

## 2012-10-27 DIAGNOSIS — N390 Urinary tract infection, site not specified: Secondary | ICD-10-CM

## 2012-10-27 MED ORDER — CIPROFLOXACIN HCL 250 MG PO TABS: 250.0000 mg | ORAL_TABLET | Freq: Every morning | ORAL | Status: DC

## 2012-10-27 NOTE — Telephone Encounter (Signed)
Pt aware  rx called to [facility]  

## 2012-10-27 NOTE — Telephone Encounter (Signed)
Pt called to request refill on cipro for UTI suppression Send to [facility]

## 2012-11-02 ENCOUNTER — Ambulatory Visit: Payer: Medicare Other | Admitting: Family Medicine

## 2012-11-09 ENCOUNTER — Encounter: Payer: Self-pay | Admitting: Family Medicine

## 2012-11-09 ENCOUNTER — Ambulatory Visit (INDEPENDENT_AMBULATORY_CARE_PROVIDER_SITE_OTHER): Payer: Medicare Other | Admitting: Family Medicine

## 2012-11-09 VITALS — BP 115/78 | HR 74 | Temp 98.2°F | Wt 159.6 lb

## 2012-11-09 DIAGNOSIS — Z853 Personal history of malignant neoplasm of breast: Secondary | ICD-10-CM

## 2012-11-09 DIAGNOSIS — M129 Arthropathy, unspecified: Secondary | ICD-10-CM

## 2012-11-09 DIAGNOSIS — E785 Hyperlipidemia, unspecified: Secondary | ICD-10-CM

## 2012-11-09 DIAGNOSIS — R350 Frequency of micturition: Secondary | ICD-10-CM | POA: Insufficient documentation

## 2012-11-09 DIAGNOSIS — E119 Type 2 diabetes mellitus without complications: Secondary | ICD-10-CM

## 2012-11-09 DIAGNOSIS — I6350 Cerebral infarction due to unspecified occlusion or stenosis of unspecified cerebral artery: Secondary | ICD-10-CM

## 2012-11-09 DIAGNOSIS — I10 Essential (primary) hypertension: Secondary | ICD-10-CM

## 2012-11-09 DIAGNOSIS — K219 Gastro-esophageal reflux disease without esophagitis: Secondary | ICD-10-CM

## 2012-11-09 DIAGNOSIS — M1990 Unspecified osteoarthritis, unspecified site: Secondary | ICD-10-CM | POA: Insufficient documentation

## 2012-11-09 LAB — POCT UA - MICROSCOPIC ONLY
Bacteria, U Microscopic: NEGATIVE
Casts, Ur, LPF, POC: NEGATIVE
Crystals, Ur, HPF, POC: NEGATIVE
Mucus, UA: NEGATIVE
Yeast, UA: NEGATIVE

## 2012-11-09 LAB — COMPLETE METABOLIC PANEL WITH GFR
ALT: 40 U/L — ABNORMAL HIGH (ref 0–35)
AST: 27 U/L (ref 0–37)
Albumin: 4 g/dL (ref 3.5–5.2)
Alkaline Phosphatase: 46 U/L (ref 39–117)
BUN: 20 mg/dL (ref 6–23)
CO2: 29 mEq/L (ref 19–32)
Calcium: 9.5 mg/dL (ref 8.4–10.5)
Chloride: 99 mEq/L (ref 96–112)
Creat: 1.18 mg/dL — ABNORMAL HIGH (ref 0.50–1.10)
GFR, Est African American: 49 mL/min — ABNORMAL LOW
GFR, Est Non African American: 42 mL/min — ABNORMAL LOW
Glucose, Bld: 170 mg/dL — ABNORMAL HIGH (ref 70–99)
Potassium: 4.3 mEq/L (ref 3.5–5.3)
Sodium: 138 mEq/L (ref 135–145)
Total Bilirubin: 0.3 mg/dL (ref 0.3–1.2)
Total Protein: 6.7 g/dL (ref 6.0–8.3)

## 2012-11-09 LAB — POCT URINALYSIS DIPSTICK
Bilirubin, UA: NEGATIVE
Blood, UA: NEGATIVE
Glucose, UA: NEGATIVE
Ketones, UA: NEGATIVE
Leukocytes, UA: NEGATIVE
Nitrite, UA: NEGATIVE
Spec Grav, UA: 1.01
Urobilinogen, UA: NEGATIVE
pH, UA: 8

## 2012-11-09 LAB — POCT GLYCOSYLATED HEMOGLOBIN (HGB A1C): Hemoglobin A1C: 5.9

## 2012-11-09 MED ORDER — TRAMADOL HCL 50 MG PO TABS: 50.0000 mg | ORAL_TABLET | Freq: Two times a day (BID) | ORAL | Status: DC | PRN

## 2012-11-09 NOTE — Progress Notes (Signed)
Patient ID: [name], female   DOB: 1926/03/20, 79 y.o.   MRN: [id number] SUBJECTIVE: CC: Chief Complaint  Patient presents with  . Follow-up    3 month follow up reck urine     HPI:  Patient is here for follow up of Diabetes Mellitus: Symptoms of DM: Denies Nocturia ,Denies Urinary Frequency , denies Blurred vision ,deniesDizziness,denies.Dysuria,denies paresthesias, denies extremity pain or ulcers.Marland Kitchendenies chest pain. has had an annual eye exam. do check the feet. Does check CBGs. Average WUJ:WJXBJ well. Denies episodes of hypoglycemia. Does have an emergency hypoglycemic plan. admits toCompliance with medications. Denies Problems with medications.  Pain is 10/10 in the joints  Especially in the hips.  Past Medical History  Diagnosis Date  . Diabetes mellitus without complication   . Hyperlipidemia   . Hypertension    Past Surgical History  Procedure Laterality Date  . Appendectomy    . Cholecystectomy    . Tonsillectomy    . Joint replacement      bilat knee    History   Social History  . Marital Status: Married    Spouse Name: N/A    Number of Children: N/A  . Years of Education: N/A   Occupational History  . Not on file.   Social History Main Topics  . Smoking status: Never Smoker   . Smokeless tobacco: Not on file  . Alcohol Use: No  . Drug Use: No  . Sexually Active: Not on file   Other Topics Concern  . Not on file   Social History Narrative  . No narrative on file   Family History  Problem Relation Age of Onset  . Cancer Mother   . Cancer Father   . Cancer Brother    Current Outpatient Prescriptions on File Prior to Visit  Medication Sig Dispense Refill  . amLODipine (NORVASC) 2.5 MG tablet Take 2.5 mg by mouth.       . Aspirin-Caffeine (BAYER BACK & BODY PAIN EX ST PO) Take by mouth.      . Calcium Carbonate-Vit D-Min (CALCIUM 1200 PO) Take 1 tablet by mouth 2 (two) times daily.      . ciprofloxacin (CIPRO) 250 MG tablet Take 1  tablet (250 mg total) by mouth every morning. For Recurrent UTI suppression  30 tablet  3  . diltiazem (CARDIZEM) 120 MG tablet Take 1 tablet (120 mg total) by mouth 2 (two) times daily.  60 tablet  5  . imipramine (TOFRANIL) 10 MG tablet Take 10 mg by mouth 2 (two) times daily.       . insulin glargine (LANTUS) 100 UNIT/ML injection Inject 30 Units into the skin every evening.      Marland Kitchen MAGNESIUM-ZINC PO Take 15 mg by mouth 3 (three) times daily.      . metFORMIN (GLUCOPHAGE) 1000 MG tablet TAKE ONE TABLET BY MOUTH TWICE DAILY  60 tablet  2  . olmesartan-hydrochlorothiazide (BENICAR HCT) 20-12.5 MG per tablet Take 1 tablet by mouth daily.      Marland Kitchen omeprazole (PRILOSEC) 20 MG capsule Take 20 mg by mouth daily.       . rosuvastatin (CRESTOR) 5 MG tablet Take 5 mg by mouth daily.      . ciprofloxacin (CIPRO) 500 MG tablet Take 1 tablet (500 mg total) by mouth 2 (two) times daily.  20 tablet  0   No current facility-administered medications on file prior to visit.   Allergies  Allergen Reactions  . Ace Inhibitors   .  Sulfa Antibiotics Nausea And Vomiting   Immunization History  Administered Date(s) Administered  . Influenza Whole 03/07/2005  . Tdap 01/04/2011  . Zoster 05/29/2012   Prior to Admission medications   Medication Sig Start Date End Date Taking? Authorizing Provider  amLODipine (NORVASC) 2.5 MG tablet Take 2.5 mg by mouth.  06/25/12  Yes Historical Provider, MD  Aspirin-Caffeine (BAYER BACK & BODY PAIN EX ST PO) Take by mouth.   Yes Historical Provider, MD  Calcium Carbonate-Vit D-Min (CALCIUM 1200 PO) Take 1 tablet by mouth 2 (two) times daily.   Yes Historical Provider, MD  ciprofloxacin (CIPRO) 250 MG tablet Take 1 tablet (250 mg total) by mouth every morning. For Recurrent UTI suppression 10/27/12  Yes Deatra Canter, FNP  diltiazem (CARDIZEM) 120 MG tablet Take 1 tablet (120 mg total) by mouth 2 (two) times daily. 08/24/12  Yes Ileana Ladd, MD  imipramine (TOFRANIL) 10 MG  tablet Take 10 mg by mouth 2 (two) times daily.  05/06/12  Yes Historical Provider, MD  insulin glargine (LANTUS) 100 UNIT/ML injection Inject 30 Units into the skin every evening.   Yes Historical Provider, MD  MAGNESIUM-ZINC PO Take 15 mg by mouth 3 (three) times daily.   Yes Historical Provider, MD  metFORMIN (GLUCOPHAGE) 1000 MG tablet TAKE ONE TABLET BY MOUTH TWICE DAILY 09/17/12  Yes Ileana Ladd, MD  olmesartan-hydrochlorothiazide (BENICAR HCT) 20-12.5 MG per tablet Take 1 tablet by mouth daily.   Yes Historical Provider, MD  omeprazole (PRILOSEC) 20 MG capsule Take 20 mg by mouth daily.  07/08/12  Yes Historical Provider, MD  rosuvastatin (CRESTOR) 5 MG tablet Take 5 mg by mouth daily.   Yes Historical Provider, MD  ciprofloxacin (CIPRO) 500 MG tablet Take 1 tablet (500 mg total) by mouth 2 (two) times daily. 08/12/12   Ileana Ladd, MD    ROS: As above in the HPI. All other systems are stable or negative.  OBJECTIVE: APPEARANCE:  Patient in no acute distress.The patient appeared well nourished and normally developed. Acyanotic. Waist: VITAL SIGNS:BP 115/78  Pulse 74  Temp(Src) 98.2 F (36.8 C) (Oral)  Wt 159 lb 9.6 oz (72.394 kg) WF  SKIN: warm and  Dry without overt rashes, tattoos and scars  HEAD and Neck: without JVD, Head and scalp: normal Eyes:No scleral icterus. Fundi normal, eye movements normal. Ears: Auricle normal, canal normal, Tympanic membranes normal, insufflation normal. Nose: normal Throat: normal Neck & thyroid: normal  CHEST & LUNGS: Chest wall: normal Lungs: Clear  CVS: Reveals the PMI to be normally located. Regular rhythm, First and Second Heart sounds are normal,  absence of murmurs, rubs or gallops. Peripheral vasculature: Radial pulses: normal Dorsal pedis pulses: normal Posterior pulses: normal  ABDOMEN:  Appearance: normal Benign, no organomegaly, no masses, no Abdominal Aortic enlargement. No Guarding , no rebound. No Bruits. Bowel  sounds: normal  RECTAL: N/A GU: N/A  EXTREMETIES: nonedematous. Both Femoral and Pedal pulses are normal.  MUSCULOSKELETAL:  Spine:reduced ROM Joints: reduced ROM of hips and knees. Knees have crepitus and are uncomfortable to ROM.  NEUROLOGIC: oriented to time,place and person; nonfocal.  ASSESSMENT: Frequency of urination - Plan: POCT urinalysis dipstick, POCT UA - Microscopic Only  HYPERTENSION - Plan: COMPLETE METABOLIC PANEL WITH GFR  HYPERLIPIDEMIA - Plan: COMPLETE METABOLIC PANEL WITH GFR, NMR Lipoprofile with Lipids  GERD  DM - Plan: POCT glycosylated hemoglobin (Hb A1C)  CVA  BREAST CANCER, HX OF  Arthritis - Plan: traMADol (ULTRAM) 50 MG tablet,  DISCONTINUED: traMADol (ULTRAM) 50 MG tablet  Her arthritis pain has increased and has become intolerable.  PLAN: Orders Placed This Encounter  Procedures  . COMPLETE METABOLIC PANEL WITH GFR  . NMR Lipoprofile with Lipids  . POCT urinalysis dipstick  . POCT UA - Microscopic Only  . POCT glycosylated hemoglobin (Hb A1C)   Meds ordered this encounter  Medications  . DISCONTD: traMADol (ULTRAM) 50 MG tablet    Sig: Take 1 tablet (50 mg total) by mouth 2 (two) times daily as needed for pain.    Dispense:  60 tablet    Refill:  1  . traMADol (ULTRAM) 50 MG tablet    Sig: Take 1 tablet (50 mg total) by mouth 2 (two) times daily as needed for pain.    Dispense:  60 tablet    Refill:  1   Results for orders placed in visit on 11/09/12  POCT URINALYSIS DIPSTICK      Result Value Range   Color, UA yellow     Clarity, UA clear     Glucose, UA neg     Bilirubin, UA neg     Ketones, UA neg     Spec Grav, UA 1.010     Blood, UA neg     pH, UA 8.0     Protein, UA mod     Urobilinogen, UA negative     Nitrite, UA neg     Leukocytes, UA Negative    POCT UA - MICROSCOPIC ONLY      Result Value Range   WBC, Ur, HPF, POC occ     RBC, urine, microscopic occ     Bacteria, U Microscopic neg     Mucus, UA neg      Epithelial cells, urine per micros occ     Crystals, Ur, HPF, POC neg     Casts, Ur, LPF, POC neg     Yeast, UA neg    POCT GLYCOSYLATED HEMOGLOBIN (HGB A1C)      Result Value Range   Hemoglobin A1C 5.9%     Await the rest of the results. Patient is on suppressive therapy for recurrent UTI.  Return in about 3 months (around 02/09/2013) for Recheck medical problems.  Jakya Dovidio P. Modesto Charon, M.D.

## 2012-11-10 ENCOUNTER — Telehealth: Payer: Self-pay | Admitting: Family Medicine

## 2012-11-10 LAB — NMR LIPOPROFILE WITH LIPIDS
Cholesterol, Total: 176 mg/dL (ref <200)
HDL Particle Number: 32.9 umol/L (ref >=30.5)
HDL Size: 9.2 nm (ref >=9.2)
HDL-C: 44 mg/dL (ref >=40)
LDL (calc): 84 mg/dL (ref <100)
LDL Particle Number: 1212 nmol/L — ABNORMAL HIGH (ref <1000)
LDL Size: 20.7 nm (ref >20.5)
LP-IR Score: 63 — ABNORMAL HIGH (ref <=45)
Large HDL-P: 4.5 umol/L — ABNORMAL LOW (ref >=4.8)
Large VLDL-P: 9.3 nmol/L — ABNORMAL HIGH (ref <=2.7)
Small LDL Particle Number: 552 nmol/L — ABNORMAL HIGH (ref <=527)
Triglycerides: 242 mg/dL — ABNORMAL HIGH (ref <150)
VLDL Size: 51.4 nm — ABNORMAL HIGH (ref <=46.6)

## 2012-11-12 NOTE — Telephone Encounter (Signed)
Yes she can take  Two at a time. It is tramadol.

## 2012-11-14 ENCOUNTER — Other Ambulatory Visit: Payer: Self-pay | Admitting: Family Medicine

## 2012-11-14 DIAGNOSIS — R899 Unspecified abnormal finding in specimens from other organs, systems and tissues: Secondary | ICD-10-CM

## 2012-11-14 NOTE — Progress Notes (Signed)
Quick Note:  Labs abnormal. The creatinine went up a little. We need to watch this Closely due to risks with the medications. Will need to recheck the BMP in 4 weeks. The rest of her labs are acceptable or Stable. ______

## 2012-11-15 NOTE — Telephone Encounter (Signed)
Spoke with pt and she verbalizes understanding understanding

## 2012-11-22 ENCOUNTER — Other Ambulatory Visit: Payer: Self-pay | Admitting: Family Medicine

## 2012-11-22 ENCOUNTER — Telehealth: Payer: Self-pay | Admitting: Family Medicine

## 2012-11-22 DIAGNOSIS — E1165 Type 2 diabetes mellitus with hyperglycemia: Secondary | ICD-10-CM

## 2012-11-22 DIAGNOSIS — E118 Type 2 diabetes mellitus with unspecified complications: Secondary | ICD-10-CM

## 2012-11-22 DIAGNOSIS — IMO0002 Reserved for concepts with insufficient information to code with codable children: Secondary | ICD-10-CM

## 2012-11-22 MED ORDER — INSULIN GLARGINE 100 UNIT/ML ~~LOC~~ SOLN: 30.0000 [IU] | Freq: Every evening | SUBCUTANEOUS | Status: DC

## 2012-11-22 NOTE — Telephone Encounter (Signed)
Done in EPIC 

## 2012-11-22 NOTE — Telephone Encounter (Signed)
Spoke with spouse and is aware rx called

## 2012-11-24 ENCOUNTER — Telehealth: Payer: Self-pay | Admitting: Family Medicine

## 2012-11-25 NOTE — Telephone Encounter (Signed)
Spoke with pt and informed the lantus [facility] called to [facility]

## 2012-11-26 ENCOUNTER — Other Ambulatory Visit: Payer: Self-pay | Admitting: Family Medicine

## 2012-11-26 MED ORDER — INSULIN GLARGINE 100 UNIT/ML SOLOSTAR PEN: 30.0000 [IU] | PEN_INJECTOR | Freq: Every day | SUBCUTANEOUS | Status: DC

## 2012-11-29 ENCOUNTER — Other Ambulatory Visit: Payer: Self-pay | Admitting: Family Medicine

## 2012-11-29 ENCOUNTER — Telehealth: Payer: Self-pay | Admitting: Family Medicine

## 2012-11-29 DIAGNOSIS — R52 Pain, unspecified: Secondary | ICD-10-CM

## 2012-11-29 DIAGNOSIS — M1990 Unspecified osteoarthritis, unspecified site: Secondary | ICD-10-CM

## 2012-11-29 MED ORDER — TRAMADOL HCL 50 MG PO TABS: 100.0000 mg | ORAL_TABLET | Freq: Three times a day (TID) | ORAL | Status: DC | PRN

## 2012-11-29 NOTE — Addendum Note (Signed)
Addended by: Pura Spice on: 11/29/2012 06:01 PM   Modules accepted: Orders, Medications

## 2012-11-29 NOTE — Telephone Encounter (Signed)
Take  2 tabs three times a  Day of the Tramadol. If this  Doesn't work will need another office visit.  Ruhaan Nordahl P. Modesto Charon, M.D.

## 2012-11-29 NOTE — Telephone Encounter (Signed)
Rx written for increased dose for pain control.

## 2012-11-29 NOTE — Telephone Encounter (Signed)
Spoke with pt and aware of instructions below

## 2012-11-29 NOTE — Addendum Note (Signed)
Addended by: Pura Spice on: 11/29/2012 06:04 PM   Modules accepted: Orders

## 2012-12-02 ENCOUNTER — Telehealth: Payer: Self-pay | Admitting: Family Medicine

## 2012-12-02 NOTE — Telephone Encounter (Signed)
Pt notiifed and per dr Modesto Charon pt needs to stop metformin

## 2012-12-02 NOTE — Telephone Encounter (Signed)
BS reading last 4 July 31  Fasting blood sugar 96 July 30 90  And one day 88.  July 29  120 July 28 148  Got shaky States eats the same [place] of food and blood sugar fluctuating. States the last 2 weeks have been shaky all day and states urine have been dark and today is last day of cipro   Dr Annabell Howells appt  Sept 2 .

## 2012-12-02 NOTE — Telephone Encounter (Signed)
Pt states also bee nauseaous and vomited 3 times yesst and not eating much  States staying in bed more now

## 2012-12-02 NOTE — Addendum Note (Signed)
Addended by: Pura Spice on: 12/02/2012 05:23 PM   Modules accepted: Orders, Medications

## 2012-12-03 ENCOUNTER — Other Ambulatory Visit: Payer: Self-pay | Admitting: Family Medicine

## 2012-12-06 ENCOUNTER — Other Ambulatory Visit (INDEPENDENT_AMBULATORY_CARE_PROVIDER_SITE_OTHER): Payer: Medicare Other

## 2012-12-06 DIAGNOSIS — R899 Unspecified abnormal finding in specimens from other organs, systems and tissues: Secondary | ICD-10-CM

## 2012-12-06 DIAGNOSIS — R6889 Other general symptoms and signs: Secondary | ICD-10-CM

## 2012-12-06 NOTE — Progress Notes (Signed)
Patient came in for labs only.

## 2012-12-07 LAB — BASIC METABOLIC PANEL WITH GFR
BUN: 17 mg/dL (ref 6–23)
CO2: 31 mEq/L (ref 19–32)
Calcium: 10.1 mg/dL (ref 8.4–10.5)
Chloride: 100 mEq/L (ref 96–112)
Creat: 0.77 mg/dL (ref 0.50–1.10)
GFR, Est African American: 82 mL/min
GFR, Est Non African American: 71 mL/min
Glucose, Bld: 132 mg/dL — ABNORMAL HIGH (ref 70–99)
Potassium: 4.1 mEq/L (ref 3.5–5.3)
Sodium: 141 mEq/L (ref 135–145)

## 2013-01-16 ENCOUNTER — Other Ambulatory Visit: Payer: Self-pay | Admitting: Family Medicine

## 2013-01-17 ENCOUNTER — Encounter: Payer: Self-pay | Admitting: *Deleted

## 2013-01-21 ENCOUNTER — Other Ambulatory Visit: Payer: Self-pay | Admitting: *Deleted

## 2013-01-21 NOTE — Telephone Encounter (Signed)
Rx request from [facility] says that patient takes 10mg  two tablets in the am and 2 tabs in the pm. Please clarify which directions are right.

## 2013-01-24 MED ORDER — IMIPRAMINE HCL 10 MG PO TABS: 20.0000 mg | ORAL_TABLET | Freq: Two times a day (BID) | ORAL | Status: DC

## 2013-01-24 NOTE — Telephone Encounter (Signed)
Prescription renewed in EPIC. 

## 2013-02-09 ENCOUNTER — Ambulatory Visit: Payer: Medicare Other | Admitting: Family Medicine

## 2013-02-11 ENCOUNTER — Encounter: Payer: Self-pay | Admitting: Family Medicine

## 2013-02-11 ENCOUNTER — Ambulatory Visit: Payer: Medicare Other | Admitting: Family Medicine

## 2013-02-11 ENCOUNTER — Ambulatory Visit (INDEPENDENT_AMBULATORY_CARE_PROVIDER_SITE_OTHER): Payer: Medicare Other | Admitting: Family Medicine

## 2013-02-11 VITALS — BP 126/76 | HR 94 | Temp 98.4°F

## 2013-02-11 DIAGNOSIS — IMO0001 Reserved for inherently not codable concepts without codable children: Secondary | ICD-10-CM

## 2013-02-11 DIAGNOSIS — E785 Hyperlipidemia, unspecified: Secondary | ICD-10-CM

## 2013-02-11 DIAGNOSIS — E119 Type 2 diabetes mellitus without complications: Secondary | ICD-10-CM

## 2013-02-11 DIAGNOSIS — J8410 Pulmonary fibrosis, unspecified: Secondary | ICD-10-CM

## 2013-02-11 DIAGNOSIS — M797 Fibromyalgia: Secondary | ICD-10-CM | POA: Insufficient documentation

## 2013-02-11 DIAGNOSIS — I10 Essential (primary) hypertension: Secondary | ICD-10-CM

## 2013-02-11 DIAGNOSIS — K219 Gastro-esophageal reflux disease without esophagitis: Secondary | ICD-10-CM

## 2013-02-11 DIAGNOSIS — I6350 Cerebral infarction due to unspecified occlusion or stenosis of unspecified cerebral artery: Secondary | ICD-10-CM

## 2013-02-11 DIAGNOSIS — Z23 Encounter for immunization: Secondary | ICD-10-CM

## 2013-02-11 DIAGNOSIS — Z853 Personal history of malignant neoplasm of breast: Secondary | ICD-10-CM

## 2013-02-11 LAB — POCT GLYCOSYLATED HEMOGLOBIN (HGB A1C): Hemoglobin A1C: 9.4

## 2013-02-11 MED ORDER — GLUCOSE BLOOD VI STRP: ORAL_STRIP | Status: DC

## 2013-02-11 MED ORDER — ONETOUCH ULTRASOFT LANCETS MISC: Status: DC

## 2013-02-11 NOTE — Progress Notes (Signed)
Patient ID: [name], female   DOB: 1925-09-04, 79 y.o.   MRN: [id number] SUBJECTIVE: CC: Chief Complaint  Patient presents with  . Follow-up    follow up diabetes ?sboutrestarting metformin .needs diabetic foot exam    HPI:  received a one touch ultra Mini glucometer here today.  Patient is here for follow up of Diabetes Mellitus/Hypertension/hyperlipidemia: Symptoms evaluated: Denies Nocturia ,Denies Urinary Frequency , denies Blurred vision ,deniesDizziness,denies.Dysuria,denies paresthesias, denies extremity pain or ulcers.Marland Kitchendenies chest pain. has had an annual eye exam. do check the feet. Does check CBGs. Average IRJ:JOACZYS malfunction wild  Swings to sugars. Denies episodes of hypoglycemia. Does have an emergency hypoglycemic plan. admits toCompliance with medications. Denies Problems with medications.  Past Medical History  Diagnosis Date  . Diabetes mellitus without complication   . Hyperlipidemia   . Hypertension    Past Surgical History  Procedure Laterality Date  . Appendectomy    . Cholecystectomy    . Tonsillectomy    . Joint replacement      bilat knee    History   Social History  . Marital Status: Married    Spouse Name: N/A    Number of Children: N/A  . Years of Education: N/A   Occupational History  . Not on file.   Social History Main Topics  . Smoking status: Never Smoker   . Smokeless tobacco: Not on file  . Alcohol Use: No  . Drug Use: No  . Sexual Activity: Not on file   Other Topics Concern  . Not on file   Social History Narrative  . No narrative on file   Family History  Problem Relation Age of Onset  . Cancer Mother   . Cancer Father   . Cancer Brother    Current Outpatient Prescriptions on File Prior to Visit  Medication Sig Dispense Refill  . amLODipine (NORVASC) 2.5 MG tablet TAKE ONE TABLET BY MOUTH EVERY DAY  30 tablet  4  . Aspirin-Caffeine (BAYER BACK & BODY PAIN EX ST PO) Take by mouth.      . Calcium  Carbonate-Vit D-Min (CALCIUM 1200 PO) Take 1 tablet by mouth 2 (two) times daily.      Marland Kitchen diltiazem (CARDIZEM) 120 MG tablet Take 1 tablet (120 mg total) by mouth 2 (two) times daily.  60 tablet  5  . MAGNESIUM-ZINC PO Take 15 mg by mouth 3 (three) times daily.      Marland Kitchen olmesartan-hydrochlorothiazide (BENICAR HCT) 20-12.5 MG per tablet Take 1 tablet by mouth daily.      Marland Kitchen omeprazole (PRILOSEC) 20 MG capsule TAKE ONE CAPSULE BY MOUTH TWICE DAILY  60 capsule  2  . rosuvastatin (CRESTOR) 5 MG tablet Take 5 mg by mouth daily.       No current facility-administered medications on file prior to visit.   Allergies  Allergen Reactions  . Ace Inhibitors   . Sulfa Antibiotics Nausea And Vomiting   Immunization History  Administered Date(s) Administered  . Influenza Whole 03/07/2005  . Influenza,inj,Quad PF,36+ Mos 02/11/2013  . Tdap 01/04/2011  . Zoster 05/29/2012   Prior to Admission medications   Medication Sig Start Date End Date Taking? Authorizing Provider  amLODipine (NORVASC) 2.5 MG tablet TAKE ONE TABLET BY MOUTH EVERY DAY 12/03/12  Yes Ernestina Penna, MD  Aspirin-Caffeine (BAYER BACK & BODY PAIN EX ST PO) Take by mouth.   Yes Historical Provider, MD  Calcium Carbonate-Vit D-Min (CALCIUM 1200 PO) Take 1 tablet by mouth 2 (two) times  daily.   Yes Historical Provider, MD  diltiazem (CARDIZEM) 120 MG tablet Take 1 tablet (120 mg total) by mouth 2 (two) times daily. 08/24/12  Yes Ileana Ladd, MD  glucose blood (ONE TOUCH ULTRA TEST) test strip Use as instructed 02/11/13  Yes Ileana Ladd, MD  insulin glargine (LANTUS) 100 UNIT/ML injection Inject 22 Units into the skin every evening. 11/22/12  Yes Ileana Ladd, MD  MAGNESIUM-ZINC PO Take 15 mg by mouth 3 (three) times daily.   Yes Historical Provider, MD  olmesartan-hydrochlorothiazide (BENICAR HCT) 20-12.5 MG per tablet Take 1 tablet by mouth daily.   Yes Historical Provider, MD  omeprazole (PRILOSEC) 20 MG capsule TAKE ONE CAPSULE BY MOUTH  TWICE DAILY 01/16/13  Yes Ileana Ladd, MD  rosuvastatin (CRESTOR) 5 MG tablet Take 5 mg by mouth daily.   Yes Historical Provider, MD  Lancets Letta Pate ULTRASOFT) lancets Use as instructed once daily 02/11/13   Ileana Ladd, MD    ROS: As above in the HPI. All other systems are stable or negative.  OBJECTIVE: APPEARANCE:  Patient in no acute distress.The patient appeared well nourished and normally developed. Acyanotic. Waist: VITAL SIGNS:BP 126/76  Pulse 94  Temp(Src) 98.4 F (36.9 C) (Oral) WF elderly    SKIN: warm and  Dry without overt rashes, tattoos and scars  HEAD and Neck: without JVD, Head and scalp: normal Eyes:No scleral icterus. Fundi normal, eye movements normal. Ears: Auricle normal, canal normal, Tympanic membranes normal, insufflation normal. Nose: normal Throat: normal Neck & thyroid: normal  CHEST & LUNGS: Chest wall: normal Lungs: Clear  CVS: Reveals the PMI to be normally located. Regular rhythm, First and Second Heart sounds are normal,  absence of murmurs, rubs or gallops. Peripheral vasculature: Radial pulses: normal Dorsal pedis pulses: normal Posterior pulses: normal  ABDOMEN:  Appearance: normal Benign, no organomegaly, no masses, no Abdominal Aortic enlargement. No Guarding , no rebound. No Bruits. Bowel sounds: normal  RECTAL: N/A GU: N/A  EXTREMETIES: nonedematous.  MUSCULOSKELETAL:  Spine: reduced ROM Joints: hips and knees reduced ROM.  Trigger points positive for Fibromyalgia. Patient jumps when the trigger points are palpated.  NEUROLOGIC: oriented to time,place and person; no changes. Ambulates with a cane    ASSESSMENT: DM (diabetes mellitus) - Plan: POCT glycosylated hemoglobin (Hb A1C), CMP14+EGFR, Lancets (ONETOUCH ULTRASOFT) lancets, glucose blood (ONE TOUCH ULTRA TEST) test strip, DISCONTINUED: glucose blood (ONE TOUCH ULTRA TEST) test strip, DISCONTINUED: Lancets (ONETOUCH ULTRASOFT) lancets  HLD  (hyperlipidemia) - Plan: CMP14+EGFR, NMR, lipoprofile  HTN (hypertension) - Plan: CMP14+EGFR  Need for prophylactic vaccination and inoculation against influenza  BREAST CANCER, HX OF  CVA  GERD  PULMONARY FIBROSIS, POSTINFLAMMATORY  Fibromyalgia - Plan: Vit D  25 hydroxy (rtn osteoporosis monitoring)    PLAN: Orders Placed This Encounter  Procedures  . Vit D  25 hydroxy (rtn osteoporosis monitoring)  . CMP14+EGFR  . NMR, lipoprofile  . POCT glycosylated hemoglobin (Hb A1C)    Meds ordered this encounter  Medications  . insulin glargine (LANTUS) 100 UNIT/ML injection    Sig: Inject 22 Units into the skin every evening.  Marland Kitchen DISCONTD: glucose blood (ONE TOUCH ULTRA TEST) test strip    Sig: 1 each by Other route as needed for other. Use as instructed  . DISCONTD: glucose blood (ONE TOUCH ULTRA TEST) test strip    Sig: Use as instructed    Dispense:  100 each    Refill:  3    For one touch  Ultra mini Glucometer  . DISCONTD: Lancets (ONETOUCH ULTRASOFT) lancets    Sig: Use as instructed once daily    Dispense:  100 each    Refill:  3  . Lancets (ONETOUCH ULTRASOFT) lancets    Sig: Use as instructed once daily    Dispense:  100 each    Refill:  3  . glucose blood (ONE TOUCH ULTRA TEST) test strip    Sig: Use as instructed    Dispense:  100 each    Refill:  3    For one touch Ultra mini Glucometer    Await labs.  Return in about 3 months (around 05/14/2013) for Recheck medical problems.  Alexzandria Massman P. Modesto Charon, M.D.

## 2013-02-13 LAB — VITAMIN D 25 HYDROXY (VIT D DEFICIENCY, FRACTURES): Vit D, 25-Hydroxy: 32.6 ng/mL (ref 30.0–100.0)

## 2013-02-13 LAB — NMR, LIPOPROFILE
Cholesterol: 193 mg/dL (ref <200)
HDL Cholesterol by NMR: 58 mg/dL (ref >=40)
HDL Particle Number: 35.7 umol/L (ref >=30.5)
LDL Particle Number: 1893 nmol/L — ABNORMAL HIGH (ref <1000)
LDL Size: 21.1 nm (ref >20.5)
LDLC SERPL CALC-MCNC: 98 mg/dL (ref <100)
LP-IR Score: 69 — ABNORMAL HIGH (ref <=45)
Small LDL Particle Number: 885 nmol/L — ABNORMAL HIGH (ref <=527)
Triglycerides by NMR: 185 mg/dL — ABNORMAL HIGH (ref <150)

## 2013-02-13 LAB — CMP14+EGFR
ALT: 48 IU/L — ABNORMAL HIGH (ref 0–32)
AST: 26 IU/L (ref 0–40)
Albumin/Globulin Ratio: 1.7 (ref 1.1–2.5)
Albumin: 4.5 g/dL (ref 3.5–4.7)
Alkaline Phosphatase: 76 IU/L (ref 39–117)
BUN/Creatinine Ratio: 20 (ref 11–26)
BUN: 18 mg/dL (ref 8–27)
CO2: 24 mmol/L (ref 18–29)
Calcium: 10 mg/dL (ref 8.6–10.2)
Chloride: 94 mmol/L — ABNORMAL LOW (ref 97–108)
Creatinine, Ser: 0.88 mg/dL (ref 0.57–1.00)
GFR calc Af Amer: 69 mL/min/{1.73_m2} (ref >59)
GFR calc non Af Amer: 60 mL/min/{1.73_m2} (ref >59)
Globulin, Total: 2.6 g/dL (ref 1.5–4.5)
Glucose: 340 mg/dL — ABNORMAL HIGH (ref 65–99)
Potassium: 5.2 mmol/L (ref 3.5–5.2)
Sodium: 137 mmol/L (ref 134–144)
Total Bilirubin: 0.3 mg/dL (ref 0.0–1.2)
Total Protein: 7.1 g/dL (ref 6.0–8.5)

## 2013-02-18 ENCOUNTER — Ambulatory Visit: Payer: Medicare Other | Admitting: Family Medicine

## 2013-03-07 ENCOUNTER — Ambulatory Visit (INDEPENDENT_AMBULATORY_CARE_PROVIDER_SITE_OTHER): Payer: Medicare Other | Admitting: Family Medicine

## 2013-03-07 ENCOUNTER — Encounter: Payer: Self-pay | Admitting: Family Medicine

## 2013-03-07 VITALS — BP 127/67 | HR 83 | Temp 97.7°F | Wt 159.8 lb

## 2013-03-07 DIAGNOSIS — E119 Type 2 diabetes mellitus without complications: Secondary | ICD-10-CM

## 2013-03-07 DIAGNOSIS — K219 Gastro-esophageal reflux disease without esophagitis: Secondary | ICD-10-CM

## 2013-03-07 DIAGNOSIS — R112 Nausea with vomiting, unspecified: Secondary | ICD-10-CM

## 2013-03-07 DIAGNOSIS — I10 Essential (primary) hypertension: Secondary | ICD-10-CM

## 2013-03-07 DIAGNOSIS — M1990 Unspecified osteoarthritis, unspecified site: Secondary | ICD-10-CM

## 2013-03-07 DIAGNOSIS — E785 Hyperlipidemia, unspecified: Secondary | ICD-10-CM

## 2013-03-07 DIAGNOSIS — I82409 Acute embolism and thrombosis of unspecified deep veins of unspecified lower extremity: Secondary | ICD-10-CM

## 2013-03-07 DIAGNOSIS — I6350 Cerebral infarction due to unspecified occlusion or stenosis of unspecified cerebral artery: Secondary | ICD-10-CM

## 2013-03-07 MED ORDER — METFORMIN HCL 500 MG PO TABS: 250.0000 mg | ORAL_TABLET | Freq: Two times a day (BID) | ORAL | Status: DC

## 2013-03-07 NOTE — Patient Instructions (Signed)
Diabetes and Foot Care Diabetes may cause you to have a poor blood supply (circulation) to your legs and feet. Because of this, the skin may be thinner, break easier, and heal more slowly. You also may have nerve damage in your legs and feet causing decreased feeling. You may not notice minor injuries to your feet that could lead to serious problems or infections. Taking care of your feet is one of the most important things you can do for yourself.  HOME CARE INSTRUCTIONS  Do not go barefoot. Bare feet are easily injured.  Check your feet daily for blisters, cuts, and redness.  Wash your feet with warm water (not hot) and mild soap. Sierrah your feet and between your toes until completely dry.  Apply a moisturizing lotion that does not contain alcohol or petroleum jelly to the dry skin on your feet and to dry brittle toenails. Do not put it between your toes.  Trim your toenails straight across. Do not dig under them or around the cuticle.  Do not cut corns or calluses, or try to remove them with medicine.  Wear clean cotton socks or stockings every day. Make sure they are not too tight. Do not wear knee high stockings since they may decrease blood flow to your legs.  Wear leather shoes that fit properly and have enough cushioning. To break in new shoes, wear them just a few hours a day to avoid injuring your feet.  Wear shoes at all times, even in the house.  Do not cross your legs. This may decrease the blood flow to your feet.  If you find a minor scrape, cut, or break in the skin on your feet, keep it and the skin around it clean and dry. These areas may be cleansed with mild soap and water. Do not use peroxide, alcohol, iodine or Merthiolate.  When you remove an adhesive bandage, be sure not to harm the skin around it.  If you have a wound, look at it several times a day to make sure it is healing.  Do not use heating pads or hot water bottles. Burns can occur. If you have lost feeling  in your feet or legs, you may not know it is happening until it is too late.  Report any cuts, sores or bruises to your caregiver. Do not wait! SEEK MEDICAL CARE IF:   You have an injury that is not healing or you notice redness, numbness, burning, or tingling.  Your feet always feel cold.  You have pain or cramps in your legs and feet. SEEK IMMEDIATE MEDICAL CARE IF:   There is increasing redness, swelling, or increasing pain in the wound.  There is a red line that goes up your leg.  Pus is coming from a wound.  You develop an unexplained oral temperature above 102 F (38.9 C), or as your caregiver suggests.  You notice a bad smell coming from an ulcer or wound. MAKE SURE YOU:   Understand these instructions.  Will watch your condition.  Will get help right away if you are not doing well or get worse. Document Released: 04/18/2000 Document Revised: 07/14/2011 Document Reviewed: 10/25/2008 [facility] Patient Information 2014 [place], [place].   Type 2 Diabetes Mellitus, Adult Type 2 diabetes mellitus, often simply referred to as type 2 diabetes, is a long-lasting (chronic) disease. In type 2 diabetes, the pancreas does not make enough insulin (a hormone), the cells are less responsive to the insulin that is made (insulin resistance), or  both. Normally, insulin moves sugars from food into the tissue cells. The tissue cells use the sugars for energy. The lack of insulin or the lack of normal response to insulin causes excess sugars to build up in the blood instead of going into the tissue cells. As a result, high blood sugar (hyperglycemia) develops. The effect of high sugar (glucose) levels can cause many complications. Type 2 diabetes was also previously called adult-onset diabetes but it can occur at any age.  RISK FACTORS  A person is predisposed to developing type 2 diabetes if someone in the family has the disease and also has one or more of the following primary risk  factors:  Overweight.  An inactive lifestyle.  A history of consistently eating high-calorie foods. Maintaining a normal weight and regular physical activity can reduce the chance of developing type 2 diabetes. SYMPTOMS  A person with type 2 diabetes may not show symptoms initially. The symptoms of type 2 diabetes appear slowly. The symptoms include:  Increased thirst (polydipsia).  Increased urination (polyuria).  Increased urination during the night (nocturia).  Weight loss. This weight loss may be rapid.  Frequent, recurring infections.  Tiredness (fatigue).  Weakness.  Vision changes, such as blurred vision.  Fruity smell to your breath.  Abdominal pain.  Nausea or vomiting.  Cuts or bruises which are slow to heal.  Tingling or numbness in the hands or feet. DIAGNOSIS Type 2 diabetes is frequently not diagnosed until complications of diabetes are present. Type 2 diabetes is diagnosed when symptoms or complications are present and when blood glucose levels are increased. Your blood glucose level may be checked by one or more of the following blood tests:  A fasting blood glucose test. You will not be allowed to eat for at least 8 hours before a blood sample is taken.  A random blood glucose test. Your blood glucose is checked at any time of the day regardless of when you ate.  A hemoglobin A1c blood glucose test. A hemoglobin A1c test provides information about blood glucose control over the previous 3 months.  An oral glucose tolerance test (OGTT). Your blood glucose is measured after you have not eaten (fasted) for 2 hours and then after you drink a glucose-containing beverage. TREATMENT   You may need to take insulin or diabetes medicine daily to keep blood glucose levels in the desired range.  You will need to match insulin dosing with exercise and healthy food choices. The treatment goal is to maintain the before meal blood sugar (preprandial glucose) level  at 70 130 mg/dL. HOME CARE INSTRUCTIONS   Have your hemoglobin A1c level checked twice a year.  Perform daily blood glucose monitoring as directed by your caregiver.  Monitor urine ketones when you are ill and as directed by your caregiver.  Take your diabetes medicine or insulin as directed by your caregiver to maintain your blood glucose levels in the desired range.  Never run out of diabetes medicine or insulin. It is needed every day.  Adjust insulin based on your intake of carbohydrates. Carbohydrates can raise blood glucose levels but need to be included in your diet. Carbohydrates provide vitamins, minerals, and fiber which are an essential part of a healthy diet. Carbohydrates are found in fruits, vegetables, whole grains, dairy products, legumes, and foods containing added sugars.    Eat healthy foods. Alternate 3 meals with 3 snacks.  Lose weight if overweight.  Carry a medical alert card or wear your medical alert jewelry.  Carry a 15 gram carbohydrate snack with you at all times to treat low blood glucose (hypoglycemia). Some examples of 15 gram carbohydrate snacks include:  Glucose tablets, 3 or 4   Glucose gel, 15 gram tube  Raisins, 2 tablespoons (24 grams)  Jelly beans, 6  Animal crackers, 8  Regular pop, 4 ounces (120 mL)  Gummy treats, 9  Recognize hypoglycemia. Hypoglycemia occurs with blood glucose levels of 70 mg/dL and below. The risk for hypoglycemia increases when fasting or skipping meals, during or after intense exercise, and during sleep. Hypoglycemia symptoms can include:  Tremors or shakes.  Decreased ability to concentrate.  Sweating.  Increased heart rate.  Headache.  Dry mouth.  Hunger.  Irritability.  Anxiety.  Restless sleep.  Altered speech or coordination.  Confusion.  Treat hypoglycemia promptly. If you are alert and able to safely swallow, follow the 15:15 rule:  Take 15 20 grams of rapid-acting glucose or  carbohydrate. Rapid-acting options include glucose gel, glucose tablets, or 4 ounces (120 mL) of fruit juice, regular soda, or low fat milk.  Check your blood glucose level 15 minutes after taking the glucose.  Take 15 20 grams more of glucose if the repeat blood glucose level is still 70 mg/dL or below.  Eat a meal or snack within 1 hour once blood glucose levels return to normal.    Be alert to polyuria and polydipsia which are early signs of hyperglycemia. An early awareness of hyperglycemia allows for prompt treatment. Treat hyperglycemia as directed by your caregiver.  Engage in at least 150 minutes of moderate-intensity physical activity a week, spread over at least 3 days of the week or as directed by your caregiver. In addition, you should engage in resistance exercise at least 2 times a week or as directed by your caregiver.  Adjust your medicine and food intake as needed if you start a new exercise or sport.  Follow your sick day plan at any time you are unable to eat or drink as usual.  Avoid tobacco use.  Limit alcohol intake to no more than 1 drink per day for nonpregnant women and 2 drinks per day for men. You should drink alcohol only when you are also eating food. Talk with your caregiver whether alcohol is safe for you. Tell your caregiver if you drink alcohol several times a week.  Follow up with your caregiver regularly.  Schedule an eye exam soon after the diagnosis of type 2 diabetes and then annually.  Perform daily skin and foot care. Examine your skin and feet daily for cuts, bruises, redness, nail problems, bleeding, blisters, or sores. A foot exam by a caregiver should be done annually.  Brush your teeth and gums at least twice a day and floss at least once a day. Follow up with your dentist regularly.  Share your diabetes management plan with your workplace or school.  Stay up-to-date with immunizations.  Learn to manage stress.  Obtain ongoing diabetes  education and support as needed.  Participate in, or seek rehabilitation as needed to maintain or improve independence and quality of life. Request a physical or occupational therapy referral if you are having foot or hand numbness or difficulties with grooming, dressing, eating, or physical activity. SEEK MEDICAL CARE IF:   You are unable to eat food or drink fluids for more than 6 hours.  You have nausea and vomiting for more than 6 hours.  Your blood glucose level is over 240 mg/dL.  There is a  change in mental status.  You develop an additional serious illness.  You have diarrhea for more than 6 hours.  You have been sick or have had a fever for a couple of days and are not getting better.  You have pain during any physical activity.  SEEK IMMEDIATE MEDICAL CARE IF:  You have difficulty breathing.  You have moderate to large ketone levels. MAKE SURE YOU:  Understand these instructions.  Will watch your condition.  Will get help right away if you are not doing well or get worse. Document Released: 04/21/2005 Document Revised: 01/14/2012 Document Reviewed: 11/18/2011 [facility] Patient Information 2014 [place], [place].

## 2013-03-07 NOTE — Progress Notes (Signed)
SUBJECTIVE: CC: Chief Complaint  Patient presents with  . Follow-up    HPI: Patient is here for follow up of Diabetes Mellitus: Symptoms evaluated: Denies Nocturia ,Denies Urinary Frequency , denies Blurred vision ,deniesDizziness,denies.Dysuria,denies paresthesias, denies extremity pain or ulcers.Marland Kitchendenies chest pain. has had an annual eye exam. do check the feet. Does check CBGs. Average CBG:150. Since starting back on the Lantus at 30 units and the metformin her sugar has quickly improved Denies episodes of hypoglycemia. Does have an emergency hypoglycemic plan. admits toCompliance with medications.  Problems with medications.: nausea and vomiting with metformin. However, she cut the dose in 1/2 and she had mild GI distress. But was tolerable. Otherwise is definitely better  Past Medical History  Diagnosis Date  . Diabetes mellitus without complication   . Hyperlipidemia   . Hypertension    Past Surgical History  Procedure Laterality Date  . Appendectomy    . Cholecystectomy    . Tonsillectomy    . Joint replacement      bilat knee    History   Social History  . Marital Status: Married    Spouse Name: N/A    Number of Children: N/A  . Years of Education: N/A   Occupational History  . Not on file.   Social History Main Topics  . Smoking status: Never Smoker   . Smokeless tobacco: Not on file  . Alcohol Use: No  . Drug Use: No  . Sexual Activity: Not on file   Other Topics Concern  . Not on file   Social History Narrative  . No narrative on file   Family History  Problem Relation Age of Onset  . Cancer Mother   . Cancer Father   . Cancer Brother    Current Outpatient Prescriptions on File Prior to Visit  Medication Sig Dispense Refill  . amLODipine (NORVASC) 2.5 MG tablet TAKE ONE TABLET BY MOUTH EVERY DAY  30 tablet  4  . Aspirin-Caffeine (BAYER BACK & BODY PAIN EX ST PO) Take by mouth.      . Calcium Carbonate-Vit D-Min (CALCIUM 1200 PO) Take 1  tablet by mouth 2 (two) times daily.      Marland Kitchen diltiazem (CARDIZEM) 120 MG tablet Take 1 tablet (120 mg total) by mouth 2 (two) times daily.  60 tablet  5  . glucose blood (ONE TOUCH ULTRA TEST) test strip Use as instructed  100 each  3  . insulin glargine (LANTUS) 100 UNIT/ML injection Inject 22 Units into the skin every evening.      . Lancets (ONETOUCH ULTRASOFT) lancets Use as instructed once daily  100 each  3  . MAGNESIUM-ZINC PO Take 15 mg by mouth 3 (three) times daily.      Marland Kitchen olmesartan-hydrochlorothiazide (BENICAR HCT) 20-12.5 MG per tablet Take 1 tablet by mouth daily.      Marland Kitchen omeprazole (PRILOSEC) 20 MG capsule TAKE ONE CAPSULE BY MOUTH TWICE DAILY  60 capsule  2  . rosuvastatin (CRESTOR) 5 MG tablet Take 5 mg by mouth daily.       No current facility-administered medications on file prior to visit.   Allergies  Allergen Reactions  . Ace Inhibitors   . Sulfa Antibiotics Nausea And Vomiting   Immunization History  Administered Date(s) Administered  . Influenza Whole 03/07/2005  . Influenza,inj,Quad PF,36+ Mos 02/11/2013  . Tdap 01/04/2011  . Zoster 05/29/2012   Prior to Admission medications   Medication Sig Start Date End Date Taking? Authorizing Provider  amLODipine (NORVASC)  2.5 MG tablet TAKE ONE TABLET BY MOUTH EVERY DAY 12/03/12   Ernestina Penna, MD  Aspirin-Caffeine (BAYER BACK & BODY PAIN EX ST PO) Take by mouth.    Historical Provider, MD  Calcium Carbonate-Vit D-Min (CALCIUM 1200 PO) Take 1 tablet by mouth 2 (two) times daily.    Historical Provider, MD  diltiazem (CARDIZEM) 120 MG tablet Take 1 tablet (120 mg total) by mouth 2 (two) times daily. 08/24/12   Ileana Ladd, MD  glucose blood (ONE TOUCH ULTRA TEST) test strip Use as instructed 02/11/13   Ileana Ladd, MD  insulin glargine (LANTUS) 100 UNIT/ML injection Inject 22 Units into the skin every evening. 11/22/12   Ileana Ladd, MD  Lancets [facility] ULTRASOFT) lancets Use as instructed once daily 02/11/13    Ileana Ladd, MD  MAGNESIUM-ZINC PO Take 15 mg by mouth 3 (three) times daily.    Historical Provider, MD  olmesartan-hydrochlorothiazide (BENICAR HCT) 20-12.5 MG per tablet Take 1 tablet by mouth daily.    Historical Provider, MD  omeprazole (PRILOSEC) 20 MG capsule TAKE ONE CAPSULE BY MOUTH TWICE DAILY 01/16/13   Ileana Ladd, MD  rosuvastatin (CRESTOR) 5 MG tablet Take 5 mg by mouth daily.    Historical Provider, MD     ROS: As above in the HPI. All other systems are stable or negative.  OBJECTIVE: APPEARANCE:  Patient in no acute distress.The patient appeared well nourished and normally developed. Acyanotic. Waist: VITAL SIGNS:BP 127/67  Pulse 83  Temp(Src) 97.7 F (36.5 C) (Oral)  Wt 159 lb 12.8 oz (72.485 kg) Elderly WF   SKIN: warm and  Dry without overt rashes, tattoos and scars  HEAD and Neck: without JVD, Head and scalp: normal Eyes:No scleral icterus. Fundi normal, eye movements normal. Ears: Auricle normal, canal normal, Tympanic membranes normal, insufflation normal. Nose: normal Throat: normal Neck & thyroid: normal  CHEST & LUNGS: Chest wall: normal Lungs: Clear  CVS: Reveals the PMI to be normally located. Regular rhythm, First and Second Heart sounds are normal,  absence of murmurs, rubs or gallops. Peripheral vasculature: Radial pulses: normal Dorsal pedis pulses: normal Posterior pulses: normal  ABDOMEN:  Appearance: overweight Benign, no organomegaly, no masses, no Abdominal Aortic enlargement. No Guarding , no rebound. No Bruits. Bowel sounds: normal  RECTAL: N/A GU: N/A  EXTREMETIES: nonedematous.  MUSCULOSKELETAL:  Spine: normal Joints: intact  NEUROLOGIC: oriented to time,place and person; nonfocal. Strength is normal Sensory is normal Reflexes are normal Cranial Nerves are normal.  ASSESSMENT: DM (diabetes mellitus) - Plan: metFORMIN (GLUCOPHAGE) 500 MG tablet, CMP14+EGFR  Nausea with  vomiting  OSTEOARTHRITIS  GERD  DVT  HTN (hypertension)  HLD (hyperlipidemia)  CVA  Will adjust the metformin dose and see how patient does. If symptoms returns patient to d/c immediately.  PLAN: Review previous labs Results for orders placed in visit on 02/11/13  VITAMIN D 25 HYDROXY      Result Value Range   Vit D, 25-Hydroxy 32.6  30.0 - 100.0 ng/mL  CMP14+EGFR      Result Value Range   Glucose 340 (*) 65 - 99 mg/dL   BUN 18  8 - 27 mg/dL   Creatinine, Ser 1.61  0.57 - 1.00 mg/dL   GFR calc non Af Amer 60  >59 mL/min/1.73   GFR calc Af Amer 69  >59 mL/min/1.73   BUN/Creatinine Ratio 20  11 - 26   Sodium 137  134 - 144 mmol/L   Potassium 5.2  3.5 - 5.2 mmol/L   Chloride 94 (*) 97 - 108 mmol/L   CO2 24  18 - 29 mmol/L   Calcium 10.0  8.6 - 10.2 mg/dL   Total Protein 7.1  6.0 - 8.5 g/dL   Albumin 4.5  3.5 - 4.7 g/dL   Globulin, Total 2.6  1.5 - 4.5 g/dL   Albumin/Globulin Ratio 1.7  1.1 - 2.5   Total Bilirubin 0.3  0.0 - 1.2 mg/dL   Alkaline Phosphatase 76  39 - 117 IU/L   AST 26  0 - 40 IU/L   ALT 48 (*) 0 - 32 IU/L  NMR, LIPOPROFILE      Result Value Range   LDL Particle Number [id number] (*) <1000 nmol/L   LDLC SERPL CALC-MCNC 98  <100 mg/dL   HDL Cholesterol by NMR 58  >=40 mg/dL   Triglycerides by NMR 185 (*) <150 mg/dL   Cholesterol 161  <096 mg/dL   HDL Particle Number 04.5  >=40.9 umol/L   Small LDL Particle Number [id number] (*) <=527 nmol/L   LDL Size 21.1  >20.5 nm   LP-IR Score 69 (*) <=45  POCT GLYCOSYLATED HEMOGLOBIN (HGB A1C)      Result Value Range   Hemoglobin A1C 9.4      Orders Placed This Encounter  Procedures  . CMP14+EGFR   Meds ordered this encounter  Medications  . DISCONTD: metFORMIN (GLUCOPHAGE) 1000 MG tablet    Sig:   . metFORMIN (GLUCOPHAGE) 500 MG tablet    Sig: Take 0.5 tablets (250 mg total) by mouth 2 (two) times daily with a meal.    Dispense:  30 tablet    Refill:  3   Medications Discontinued During This Encounter   Medication Reason  . metFORMIN (GLUCOPHAGE) 1000 MG tablet Reorder   Return in about 4 weeks (around 04/04/2013) for Recheck medical problems.  Emiya Loomer P. [place] Charon, M.D.

## 2013-03-08 LAB — CMP14+EGFR
ALT: 37 IU/L — ABNORMAL HIGH (ref 0–32)
AST: 32 IU/L (ref 0–40)
Albumin/Globulin Ratio: 1.8 (ref 1.1–2.5)
Albumin: 4.3 g/dL (ref 3.5–4.7)
Alkaline Phosphatase: 68 IU/L (ref 39–117)
BUN/Creatinine Ratio: 20 (ref 11–26)
BUN: 18 mg/dL (ref 8–27)
CO2: 27 mmol/L (ref 18–29)
Calcium: 10.2 mg/dL (ref 8.6–10.2)
Chloride: 96 mmol/L — ABNORMAL LOW (ref 97–108)
Creatinine, Ser: 0.9 mg/dL (ref 0.57–1.00)
GFR calc Af Amer: 67 mL/min/{1.73_m2} (ref >59)
GFR calc non Af Amer: 58 mL/min/{1.73_m2} — ABNORMAL LOW (ref >59)
Globulin, Total: 2.4 g/dL (ref 1.5–4.5)
Glucose: 213 mg/dL — ABNORMAL HIGH (ref 65–99)
Potassium: 5.2 mmol/L (ref 3.5–5.2)
Sodium: 139 mmol/L (ref 134–144)
Total Bilirubin: 0.3 mg/dL (ref 0.0–1.2)
Total Protein: 6.7 g/dL (ref 6.0–8.5)

## 2013-03-09 NOTE — Progress Notes (Signed)
Quick Note:  Call Patient Labs abnormal: Sugar is Still a little high  Recommendations: Increase the Lantus to 30 Units at night if she hasn't done it as yet. Keep the follow up in 4 weeks.   ______

## 2013-03-10 ENCOUNTER — Other Ambulatory Visit: Payer: Self-pay | Admitting: Family Medicine

## 2013-03-11 ENCOUNTER — Other Ambulatory Visit: Payer: Self-pay

## 2013-04-04 ENCOUNTER — Ambulatory Visit (INDEPENDENT_AMBULATORY_CARE_PROVIDER_SITE_OTHER): Payer: Medicare Other | Admitting: Family Medicine

## 2013-04-04 ENCOUNTER — Encounter: Payer: Self-pay | Admitting: Family Medicine

## 2013-04-04 ENCOUNTER — Encounter (INDEPENDENT_AMBULATORY_CARE_PROVIDER_SITE_OTHER): Payer: Self-pay

## 2013-04-04 VITALS — BP 113/63 | HR 73 | Temp 97.8°F | Ht 60.0 in | Wt 163.6 lb

## 2013-04-04 DIAGNOSIS — E119 Type 2 diabetes mellitus without complications: Secondary | ICD-10-CM

## 2013-04-04 DIAGNOSIS — I6350 Cerebral infarction due to unspecified occlusion or stenosis of unspecified cerebral artery: Secondary | ICD-10-CM

## 2013-04-04 DIAGNOSIS — E785 Hyperlipidemia, unspecified: Secondary | ICD-10-CM

## 2013-04-04 DIAGNOSIS — IMO0001 Reserved for inherently not codable concepts without codable children: Secondary | ICD-10-CM

## 2013-04-04 DIAGNOSIS — M797 Fibromyalgia: Secondary | ICD-10-CM

## 2013-04-04 DIAGNOSIS — J8410 Pulmonary fibrosis, unspecified: Secondary | ICD-10-CM

## 2013-04-04 DIAGNOSIS — I10 Essential (primary) hypertension: Secondary | ICD-10-CM

## 2013-04-04 DIAGNOSIS — M1990 Unspecified osteoarthritis, unspecified site: Secondary | ICD-10-CM

## 2013-04-04 DIAGNOSIS — Z853 Personal history of malignant neoplasm of breast: Secondary | ICD-10-CM

## 2013-04-04 MED ORDER — METFORMIN HCL 500 MG PO TABS: 500.0000 mg | ORAL_TABLET | Freq: Two times a day (BID) | ORAL | Status: DC

## 2013-04-04 NOTE — Progress Notes (Signed)
Patient ID: [name], female   DOB: [birth year], 79 y.o.   MRN: [id number] SUBJECTIVE: CC: Chief Complaint  Patient presents with  . Follow-up    1 month follow up diabetes. states BS 's still flucurating no matter what she eats.     HPI: Patient is here for follow up of Diabetes Mellitus: Symptoms evaluated: Denies Nocturia ,Denies Urinary Frequency , denies Blurred vision ,deniesDizziness,denies.Dysuria,denies paresthesias, denies extremity pain or ulcers.Marland Kitchendenies chest pain. has had an annual eye exam. do check the feet. Does check CBGs. Average WUJ:WJXB 141  Denies episodes of hypoglycemia. Does have an emergency hypoglycemic plan. admits toCompliance with medications. Problems with medications.   We have had a problem getting her DM back under control since the metformin was discontinued due to an isolated sudden increase in her Creatinine. However on repeating her creatinine , it was found to be normal and she was having significant GI symptoms at the time as well. She is tolerating the restart at a lower dose without any problems, however her sugar is not under control.  Past Medical History  Diagnosis Date  . Diabetes mellitus without complication   . Hyperlipidemia   . Hypertension    Past Surgical History  Procedure Laterality Date  . Appendectomy    . Cholecystectomy    . Tonsillectomy    . Joint replacement      bilat knee    History   Social History  . Marital Status: Married    Spouse Name: N/A    Number of Children: N/A  . Years of Education: N/A   Occupational History  . Not on file.   Social History Main Topics  . Smoking status: Never Smoker   . Smokeless tobacco: Not on file  . Alcohol Use: No  . Drug Use: No  . Sexual Activity: Not on file   Other Topics Concern  . Not on file   Social History Narrative  . No narrative on file   Family History  Problem Relation Age of Onset  . Cancer Mother   . Cancer Father   . Cancer  Brother    Current Outpatient Prescriptions on File Prior to Visit  Medication Sig Dispense Refill  . amLODipine (NORVASC) 2.5 MG tablet TAKE ONE TABLET BY MOUTH EVERY DAY  30 tablet  4  . Aspirin-Caffeine (BAYER BACK & BODY PAIN EX ST PO) Take by mouth.      . B-D ULTRAFINE III SHORT PEN 31G X 8 MM MISC USE AS DIRECTED  100 each  2  . Calcium Carbonate-Vit D-Min (CALCIUM 1200 PO) Take 1 tablet by mouth 2 (two) times daily.      Marland Kitchen diltiazem (CARDIZEM) 120 MG tablet Take 1 tablet (120 mg total) by mouth 2 (two) times daily.  60 tablet  5  . glucose blood (ONE TOUCH ULTRA TEST) test strip Use as instructed  100 each  3  . insulin glargine (LANTUS) 100 UNIT/ML injection Inject 30 Units into the skin every evening.       . Lancets (ONETOUCH ULTRASOFT) lancets Use as instructed once daily  100 each  3  . MAGNESIUM-ZINC PO Take 15 mg by mouth 3 (three) times daily.      Marland Kitchen olmesartan-hydrochlorothiazide (BENICAR HCT) 20-12.5 MG per tablet Take 1 tablet by mouth daily.      Marland Kitchen omeprazole (PRILOSEC) 20 MG capsule TAKE ONE CAPSULE BY MOUTH TWICE DAILY  60 capsule  2  . rosuvastatin (CRESTOR) 5 MG tablet Take  5 mg by mouth daily.       No current facility-administered medications on file prior to visit.   Allergies  Allergen Reactions  . Ace Inhibitors   . Sulfa Antibiotics Nausea And Vomiting   Immunization History  Administered Date(s) Administered  . Influenza Whole 03/07/2005  . Influenza,inj,Quad PF,36+ Mos 02/11/2013  . Tdap 01/04/2011  . Zoster 05/29/2012   Prior to Admission medications   Medication Sig Start Date End Date Taking? Authorizing Provider  amLODipine (NORVASC) 2.5 MG tablet TAKE ONE TABLET BY MOUTH EVERY DAY 12/03/12   Ernestina Penna, MD  Aspirin-Caffeine (BAYER BACK & BODY PAIN EX ST PO) Take by mouth.    Historical Provider, MD  B-D ULTRAFINE III SHORT PEN 31G X 8 MM MISC USE AS DIRECTED 03/10/13   Ernestina Penna, MD  Calcium Carbonate-Vit D-Min (CALCIUM 1200 PO) Take 1  tablet by mouth 2 (two) times daily.    Historical Provider, MD  diltiazem (CARDIZEM) 120 MG tablet Take 1 tablet (120 mg total) by mouth 2 (two) times daily. 08/24/12   Ileana Ladd, MD  glucose blood (ONE TOUCH ULTRA TEST) test strip Use as instructed 02/11/13   Ileana Ladd, MD  insulin glargine (LANTUS) 100 UNIT/ML injection Inject 22 Units into the skin every evening. 11/22/12   Ileana Ladd, MD  Lancets [facility] ULTRASOFT) lancets Use as instructed once daily 02/11/13   Ileana Ladd, MD  MAGNESIUM-ZINC PO Take 15 mg by mouth 3 (three) times daily.    Historical Provider, MD  metFORMIN (GLUCOPHAGE) 500 MG tablet Take 0.5 tablets (250 mg total) by mouth 2 (two) times daily with a meal. 03/07/13   Ileana Ladd, MD  olmesartan-hydrochlorothiazide (BENICAR HCT) 20-12.5 MG per tablet Take 1 tablet by mouth daily.    Historical Provider, MD  omeprazole (PRILOSEC) 20 MG capsule TAKE ONE CAPSULE BY MOUTH TWICE DAILY 01/16/13   Ileana Ladd, MD  rosuvastatin (CRESTOR) 5 MG tablet Take 5 mg by mouth daily.    Historical Provider, MD     ROS: As above in the HPI. All other systems are stable or negative.  OBJECTIVE: APPEARANCE:  Patient in no acute distress.The patient appeared well nourished and normally developed. Acyanotic. Waist: VITAL SIGNS:BP 113/63  Pulse 73  Temp(Src) 97.8 F (36.6 C) (Oral)  Ht 5' (1.524 m)  Wt 163 lb 9.6 oz (74.208 kg)  BMI 31.95 kg/m2  Elderly WF  SKIN: warm and  Dry without overt rashes, tattoos and scars  HEAD and Neck: without JVD, Head and scalp: normal Eyes:No scleral icterus. Fundi normal, eye movements normal. Ears: Auricle normal, canal normal, Tympanic membranes normal, insufflation normal. Nose: normal Throat: normal Neck & thyroid: normal  CHEST & LUNGS: Chest wall: normal Lungs: Clear  CVS: Reveals the PMI to be normally located. Regular rhythm, First and Second Heart sounds are normal,  absence of murmurs, rubs or  gallops. Peripheral vasculature: Radial pulses: normal Dorsal pedis pulses: normal Posterior pulses: normal  ABDOMEN:  Appearance: normal Benign, no organomegaly, no masses, no Abdominal Aortic enlargement. No Guarding , no rebound. No Bruits. Bowel sounds: normal  RECTAL: N/A GU: N/A  EXTREMETIES: nonedematous.  NEUROLOGIC: oriented to time,place and person; nonfocal.  Results for orders placed in visit on 03/07/13  CMP14+EGFR      Result Value Range   Glucose 213 (*) 65 - 99 mg/dL   BUN 18  8 - 27 mg/dL   Creatinine, Ser 1.61  0.57 -  1.00 mg/dL   GFR calc non Af Amer 58 (*) >59 mL/min/1.73   GFR calc Af Amer 67  >59 mL/min/1.73   BUN/Creatinine Ratio 20  11 - 26   Sodium 139  134 - 144 mmol/L   Potassium 5.2  3.5 - 5.2 mmol/L   Chloride 96 (*) 97 - 108 mmol/L   CO2 27  18 - 29 mmol/L   Calcium 10.2  8.6 - 10.2 mg/dL   Total Protein 6.7  6.0 - 8.5 g/dL   Albumin 4.3  3.5 - 4.7 g/dL   Globulin, Total 2.4  1.5 - 4.5 g/dL   Albumin/Globulin Ratio 1.8  1.1 - 2.5   Total Bilirubin 0.3  0.0 - 1.2 mg/dL   Alkaline Phosphatase 68  39 - 117 IU/L   AST 32  0 - 40 IU/L   ALT 37 (*) 0 - 32 IU/L     ASSESSMENT: DM (diabetes mellitus) - Plan: metFORMIN (GLUCOPHAGE) 500 MG tablet, BMP8+EGFR  PLAN:  Orders Placed This Encounter  Procedures  . BMP8+EGFR   Meds ordered this encounter  Medications  . metFORMIN (GLUCOPHAGE) 500 MG tablet    Sig: Take 1 tablet (500 mg total) by mouth 2 (two) times daily with a meal.    Dispense:  60 tablet    Refill:  3   Medications Discontinued During This Encounter  Medication Reason  . metFORMIN (GLUCOPHAGE) 500 MG tablet Reorder   Return in about 6 weeks (around 05/16/2013) for Recheck medical problems. and to follow up on tolerance to the metformin.  Emelio Schneller P. [place] Charon, M.D.

## 2013-04-05 LAB — BMP8+EGFR
BUN/Creatinine Ratio: 18 (ref 11–26)
BUN: 16 mg/dL (ref 8–27)
CO2: 24 mmol/L (ref 18–29)
Calcium: 9.3 mg/dL (ref 8.6–10.2)
Chloride: 101 mmol/L (ref 97–108)
Creatinine, Ser: 0.87 mg/dL (ref 0.57–1.00)
GFR calc Af Amer: 70 mL/min/{1.73_m2} (ref >59)
GFR calc non Af Amer: 61 mL/min/{1.73_m2} (ref >59)
Glucose: 212 mg/dL — ABNORMAL HIGH (ref 65–99)
Potassium: 4.7 mmol/L (ref 3.5–5.2)
Sodium: 144 mmol/L (ref 134–144)

## 2013-04-06 ENCOUNTER — Telehealth: Payer: Self-pay | Admitting: *Deleted

## 2013-04-06 NOTE — Telephone Encounter (Signed)
Pt.notified

## 2013-04-06 NOTE — Telephone Encounter (Signed)
Message copied by Baltazar Apo on Wed Apr 06, 2013  8:34 AM ------      Message from: Ileana Ladd      Created: Tue Apr 05, 2013  5:14 PM       Labs are not at goal.      Sugar was high            Recommend the following:      No change in plans ------

## 2013-05-02 ENCOUNTER — Other Ambulatory Visit: Payer: Self-pay | Admitting: Family Medicine

## 2013-05-11 ENCOUNTER — Other Ambulatory Visit: Payer: Self-pay

## 2013-05-11 DIAGNOSIS — I10 Essential (primary) hypertension: Secondary | ICD-10-CM

## 2013-05-11 DIAGNOSIS — E119 Type 2 diabetes mellitus without complications: Secondary | ICD-10-CM

## 2013-05-11 MED ORDER — OMEPRAZOLE 20 MG PO CPDR: 20.0000 mg | DELAYED_RELEASE_CAPSULE | Freq: Two times a day (BID) | ORAL | Status: DC

## 2013-05-11 MED ORDER — INSULIN GLARGINE 100 UNIT/ML ~~LOC~~ SOLN: 30.0000 [IU] | Freq: Every evening | SUBCUTANEOUS | Status: DC

## 2013-05-11 MED ORDER — AMLODIPINE BESYLATE 2.5 MG PO TABS: 2.5000 mg | ORAL_TABLET | Freq: Every day | ORAL | Status: DC

## 2013-05-11 MED ORDER — ROSUVASTATIN CALCIUM 5 MG PO TABS: 5.0000 mg | ORAL_TABLET | Freq: Every day | ORAL | Status: DC

## 2013-05-11 MED ORDER — DILTIAZEM HCL 120 MG PO TABS: 120.0000 mg | ORAL_TABLET | Freq: Two times a day (BID) | ORAL | Status: DC

## 2013-05-11 MED ORDER — OLMESARTAN MEDOXOMIL-HCTZ 20-12.5 MG PO TABS: 1.0000 | ORAL_TABLET | Freq: Every day | ORAL | Status: DC

## 2013-05-11 MED ORDER — METFORMIN HCL 500 MG PO TABS: 500.0000 mg | ORAL_TABLET | Freq: Two times a day (BID) | ORAL | Status: DC

## 2013-05-12 ENCOUNTER — Other Ambulatory Visit: Payer: Self-pay

## 2013-05-12 DIAGNOSIS — E119 Type 2 diabetes mellitus without complications: Secondary | ICD-10-CM

## 2013-05-12 DIAGNOSIS — I10 Essential (primary) hypertension: Secondary | ICD-10-CM

## 2013-05-12 MED ORDER — DILTIAZEM HCL 120 MG PO TABS
120.0000 mg | ORAL_TABLET | Freq: Two times a day (BID) | ORAL | Status: DC
Start: 2013-05-12 — End: 2013-07-14

## 2013-05-12 MED ORDER — OLMESARTAN MEDOXOMIL-HCTZ 20-12.5 MG PO TABS: 1.0000 | ORAL_TABLET | Freq: Every day | ORAL | Status: DC

## 2013-05-12 MED ORDER — AMLODIPINE BESYLATE 2.5 MG PO TABS: 2.5000 mg | ORAL_TABLET | Freq: Every day | ORAL | Status: DC

## 2013-05-12 MED ORDER — ROSUVASTATIN CALCIUM 5 MG PO TABS: 5.0000 mg | ORAL_TABLET | Freq: Every day | ORAL | Status: DC

## 2013-05-12 MED ORDER — METFORMIN HCL 500 MG PO TABS: 500.0000 mg | ORAL_TABLET | Freq: Two times a day (BID) | ORAL | Status: DC

## 2013-05-12 MED ORDER — INSULIN GLARGINE 100 UNIT/ML ~~LOC~~ SOLN: 30.0000 [IU] | Freq: Every evening | SUBCUTANEOUS | Status: DC

## 2013-05-12 MED ORDER — OMEPRAZOLE 20 MG PO CPDR: 20.0000 mg | DELAYED_RELEASE_CAPSULE | Freq: Two times a day (BID) | ORAL | Status: DC

## 2013-05-17 ENCOUNTER — Ambulatory Visit: Payer: Medicare Other | Admitting: Family Medicine

## 2013-05-26 ENCOUNTER — Telehealth: Payer: Self-pay | Admitting: Family Medicine

## 2013-05-30 ENCOUNTER — Telehealth: Payer: Self-pay | Admitting: Family Medicine

## 2013-05-30 NOTE — Telephone Encounter (Signed)
[facility] and clarified all her med, pt aware

## 2013-06-02 NOTE — Telephone Encounter (Signed)
Per note [name] contacted mail order on 05/30/13. Pt will call mail order

## 2013-06-16 ENCOUNTER — Telehealth: Payer: Self-pay | Admitting: Family Medicine

## 2013-06-17 ENCOUNTER — Telehealth: Payer: Self-pay | Admitting: Family Medicine

## 2013-06-23 ENCOUNTER — Ambulatory Visit: Payer: Medicare HMO | Admitting: Family Medicine

## 2013-07-01 ENCOUNTER — Ambulatory Visit: Payer: Medicare HMO | Admitting: Family Medicine

## 2013-07-14 ENCOUNTER — Ambulatory Visit (INDEPENDENT_AMBULATORY_CARE_PROVIDER_SITE_OTHER): Payer: Medicare HMO | Admitting: Family Medicine

## 2013-07-14 ENCOUNTER — Encounter: Payer: Self-pay | Admitting: Family Medicine

## 2013-07-14 VITALS — BP 139/66 | HR 78 | Temp 98.8°F | Ht 60.0 in | Wt 160.8 lb

## 2013-07-14 DIAGNOSIS — F4321 Adjustment disorder with depressed mood: Secondary | ICD-10-CM | POA: Insufficient documentation

## 2013-07-14 DIAGNOSIS — E785 Hyperlipidemia, unspecified: Secondary | ICD-10-CM

## 2013-07-14 DIAGNOSIS — M1990 Unspecified osteoarthritis, unspecified site: Secondary | ICD-10-CM

## 2013-07-14 DIAGNOSIS — M797 Fibromyalgia: Secondary | ICD-10-CM

## 2013-07-14 DIAGNOSIS — IMO0001 Reserved for inherently not codable concepts without codable children: Secondary | ICD-10-CM

## 2013-07-14 DIAGNOSIS — M129 Arthropathy, unspecified: Secondary | ICD-10-CM

## 2013-07-14 DIAGNOSIS — I6350 Cerebral infarction due to unspecified occlusion or stenosis of unspecified cerebral artery: Secondary | ICD-10-CM

## 2013-07-14 DIAGNOSIS — E119 Type 2 diabetes mellitus without complications: Secondary | ICD-10-CM

## 2013-07-14 DIAGNOSIS — Z853 Personal history of malignant neoplasm of breast: Secondary | ICD-10-CM

## 2013-07-14 DIAGNOSIS — K219 Gastro-esophageal reflux disease without esophagitis: Secondary | ICD-10-CM

## 2013-07-14 DIAGNOSIS — I10 Essential (primary) hypertension: Secondary | ICD-10-CM

## 2013-07-14 DIAGNOSIS — J8410 Pulmonary fibrosis, unspecified: Secondary | ICD-10-CM

## 2013-07-14 LAB — POCT GLYCOSYLATED HEMOGLOBIN (HGB A1C): Hemoglobin A1C: 6.5

## 2013-07-14 MED ORDER — AMLODIPINE BESYLATE 5 MG PO TABS: 5.0000 mg | ORAL_TABLET | Freq: Every day | ORAL | Status: DC

## 2013-07-14 NOTE — Progress Notes (Signed)
Patient ID: [name], female   DOB: 04-01-1926, 80 y.o.   MRN: [id number] SUBJECTIVE: CC: Chief Complaint  Patient presents with  . Follow-up    follow up 3 month chronic problems needs new rx on lantus    HPI: Patient is here for follow up of Diabetes Mellitus/HLD/HTN/Obesity : Symptoms evaluated: Denies Nocturia ,Denies Urinary Frequency , denies Blurred vision ,deniesDizziness,denies.Dysuria,denies paresthesias, denies extremity pain or ulcers.Marland Kitchendenies chest pain. has had an annual eye exam. do check the feet. Does check CBGs. Average CBG:see copied results in the media Denies episodes of hypoglycemia. Does have an emergency hypoglycemic plan. admits toCompliance with medications. Denies Problems with medications.  Grieving over the recent loss of her husband from metastatic cancer.  Past Medical History  Diagnosis Date  . Diabetes mellitus without complication   . Hyperlipidemia   . Hypertension    Past Surgical History  Procedure Laterality Date  . Appendectomy    . Cholecystectomy    . Tonsillectomy    . Joint replacement      bilat knee    History   Social History  . Marital Status: Married    Spouse Name: N/A    Number of Children: N/A  . Years of Education: N/A   Occupational History  . Not on file.   Social History Main Topics  . Smoking status: Never Smoker   . Smokeless tobacco: Not on file  . Alcohol Use: No  . Drug Use: No  . Sexual Activity: Not on file   Other Topics Concern  . Not on file   Social History Narrative  . No narrative on file   Family History  Problem Relation Age of Onset  . Cancer Mother   . Cancer Father   . Cancer Brother    Current Outpatient Prescriptions on File Prior to Visit  Medication Sig Dispense Refill  . Aspirin-Caffeine (BAYER BACK & BODY PAIN EX ST PO) Take by mouth.      . B-D ULTRAFINE III SHORT PEN 31G X 8 MM MISC USE AS DIRECTED  100 each  2  . Calcium Carbonate-Vit D-Min (CALCIUM 1200 PO)  Take 1 tablet by mouth 2 (two) times daily.      Marland Kitchen glucose blood (ONE TOUCH ULTRA TEST) test strip Use as instructed  100 each  3  . insulin glargine (LANTUS) 100 UNIT/ML injection Inject 0.3 mLs (30 Units total) into the skin every evening.  30 mL  0  . Lancets (ONETOUCH ULTRASOFT) lancets Use as instructed once daily  100 each  3  . MAGNESIUM-ZINC PO Take 15 mg by mouth 3 (three) times daily.      Marland Kitchen olmesartan-hydrochlorothiazide (BENICAR HCT) 20-12.5 MG per tablet Take 1 tablet by mouth daily.  90 tablet  0  . omeprazole (PRILOSEC) 20 MG capsule Take 1 capsule (20 mg total) by mouth 2 (two) times daily before a meal.  180 capsule  0  . rosuvastatin (CRESTOR) 5 MG tablet Take 1 tablet (5 mg total) by mouth daily.  90 tablet  0   No current facility-administered medications on file prior to visit.   Allergies  Allergen Reactions  . Ace Inhibitors   . Sulfa Antibiotics Nausea And Vomiting   Immunization History  Administered Date(s) Administered  . Influenza Whole 03/07/2005  . Influenza,inj,Quad PF,36+ Mos 02/11/2013  . Tdap 01/04/2011  . Zoster 05/29/2012   Prior to Admission medications   Medication Sig Start Date End Date Taking? Authorizing Provider  amLODipine (NORVASC)  2.5 MG tablet Take 1 tablet (2.5 mg total) by mouth daily. 05/12/13  Yes Vernie Shanks, MD  Aspirin-Caffeine (BAYER BACK & BODY PAIN EX ST PO) Take by mouth.   Yes Historical Provider, MD  B-D ULTRAFINE III SHORT PEN 31G X 8 MM MISC USE AS DIRECTED 03/10/13  Yes Chipper Herb, MD  Calcium Carbonate-Vit D-Min (CALCIUM 1200 PO) Take 1 tablet by mouth 2 (two) times daily.   Yes Historical Provider, MD  diltiazem (CARDIZEM) 120 MG tablet Take 1 tablet (120 mg total) by mouth 2 (two) times daily. 05/12/13  Yes Vernie Shanks, MD  glucose blood (ONE TOUCH ULTRA TEST) test strip Use as instructed 02/11/13  Yes Vernie Shanks, MD  insulin glargine (LANTUS) 100 UNIT/ML injection Inject 0.3 mLs (30 Units total) into the skin  every evening. 05/12/13  Yes Vernie Shanks, MD  Lancets [facility] ULTRASOFT) lancets Use as instructed once daily 02/11/13  Yes Vernie Shanks, MD  MAGNESIUM-ZINC PO Take 15 mg by mouth 3 (three) times daily.   Yes Historical Provider, MD  metFORMIN (GLUCOPHAGE) 500 MG tablet Take 1 tablet (500 mg total) by mouth 2 (two) times daily with a meal. 05/12/13  Yes Vernie Shanks, MD  olmesartan-hydrochlorothiazide (BENICAR HCT) 20-12.5 MG per tablet Take 1 tablet by mouth daily. 05/12/13  Yes Vernie Shanks, MD  omeprazole (PRILOSEC) 20 MG capsule Take 1 capsule (20 mg total) by mouth 2 (two) times daily before a meal. 05/12/13  Yes Vernie Shanks, MD  rosuvastatin (CRESTOR) 5 MG tablet Take 1 tablet (5 mg total) by mouth daily. 05/12/13  Yes Vernie Shanks, MD     ROS: As above in the HPI. All other systems are stable or negative.  OBJECTIVE: APPEARANCE:  Patient in no acute distress.The patient appeared well nourished and normally developed. Acyanotic. Waist: VITAL SIGNS:BP 139/66  Pulse 78  Temp(Src) 98.8 F (37.1 C) (Oral)  Ht 5' (1.524 m)  Wt 160 lb 12.8 oz (72.938 kg)  BMI 31.40 kg/m2  Obese elderly WF SKIN: warm and  Dry without overt rashes, tattoos and scars  HEAD and Neck: without JVD, Head and scalp: normal Eyes:No scleral icterus. Fundi normal, eye movements normal. Ears: Auricle normal, canal normal, Tympanic membranes normal, insufflation normal. Nose: normal Throat: normal Neck & thyroid: normal  CHEST & LUNGS: Chest wall: normal Lungs: Clear  CVS: Reveals the PMI to be normally located. Regular rhythm, First and Second Heart sounds are normal,  absence of murmurs, rubs or gallops. Peripheral vasculature: Radial pulses: normal Dorsal pedis pulses: normal Posterior pulses: normal  ABDOMEN:  Appearance: normal Benign, no organomegaly, no masses, no Abdominal Aortic enlargement. No Guarding , no rebound. No Bruits. Bowel sounds: normal  RECTAL: N/A GU:  N/A  EXTREMETIES: nonedematous.  MUSCULOSKELETAL:  Spine: normal Joints: intact  NEUROLOGIC: oriented to time,place and person; nonfocal. Strength is normal Sensory is normal Reflexes are normal Cranial Nerves are normal.  ASSESSMENT: DM (diabetes mellitus) - Plan: POCT glycosylated hemoglobin (Hb A1C), CMP14+EGFR, Ambulatory referral to Podiatry, POCT UA - Microalbumin  PULMONARY FIBROSIS, POSTINFLAMMATORY  HTN (hypertension) - Plan: CMP14+EGFR, diltiazem (CARDIZEM) 120 MG tablet, amLODipine (NORVASC) 5 MG tablet  HLD (hyperlipidemia) - Plan: CMP14+EGFR, NMR, lipoprofile  CVA  OSTEOARTHRITIS  GERD  Fibromyalgia  BREAST CANCER, HX OF  Arthritis  Grieving  Will adjust her BP medications and  Stream line her regimen.  PLAN: DM foot care in the AVS  Supportive therapy.  Discussed her  life changes with being widowed.coping with the changes. We had a long discussion for 40 minutes. Diet and  Exercise  Discussed.  Her CBGs were reviewed.  BPs reviewed. Review results in the media  Section of EPIC that was photographed in.  Orders Placed This Encounter  Procedures  . CMP14+EGFR  . NMR, lipoprofile  . Ambulatory referral to Podiatry    Referral Priority:  Routine    Referral Type:  Consultation    Referral Reason:  Specialty Services Required    Requested Specialty:  Podiatry    Number of Visits Requested:  1  . POCT glycosylated hemoglobin (Hb A1C)  . POCT UA - Microalbumin   Meds ordered this encounter  Medications  . DISCONTD: diltiazem (CARDIZEM) 120 MG tablet    Sig: Take 120 mg by mouth daily.  . metFORMIN (GLUCOPHAGE) 500 MG tablet    Sig: Take 500 mg by mouth daily with breakfast.  . diltiazem (CARDIZEM) 120 MG tablet    Sig: Take 0.5 tablets (60 mg total) by mouth daily.    Dispense:  30 tablet    Refill:  0  . amLODipine (NORVASC) 5 MG tablet    Sig: Take 1 tablet (5 mg total) by mouth daily.    Dispense:  90 tablet    Refill:  1    Medications Discontinued During This Encounter  Medication Reason  . metFORMIN (GLUCOPHAGE) 500 MG tablet   . diltiazem (CARDIZEM) 120 MG tablet   . diltiazem (CARDIZEM) 120 MG tablet Reorder  . amLODipine (NORVASC) 2.5 MG tablet Reorder   Return in about 2 weeks (around 07/28/2013) for Recheck medical problems. and recheck BP. Hopefully we can wean her off the Diltiazem.   Chrisoula Zegarra P. Jacelyn Grip, M.D.

## 2013-07-14 NOTE — Patient Instructions (Signed)
Diabetes and Foot Care Diabetes may cause you to have problems because of poor blood supply (circulation) to your feet and legs. This may cause the skin on your feet to become thinner, break easier, and heal more slowly. Your skin may become dry, and the skin may peel and crack. You may also have nerve damage in your legs and feet causing decreased feeling in them. You may not notice minor injuries to your feet that could lead to infections or more serious problems. Taking care of your feet is one of the most important things you can do for yourself.  HOME CARE INSTRUCTIONS  Wear shoes at all times, even in the house. Do not go barefoot. Bare feet are easily injured.  Check your feet daily for blisters, cuts, and redness. If you cannot see the bottom of your feet, use a mirror or ask someone for help.  Wash your feet with warm water (do not use hot water) and mild soap. Then Christyne your feet and the areas between your toes until they are completely dry. Do not soak your feet as this can dry your skin.  Apply a moisturizing lotion or petroleum jelly (that does not contain alcohol and is unscented) to the skin on your feet and to dry, brittle toenails. Do not apply lotion between your toes.  Trim your toenails straight across. Do not dig under them or around the cuticle. File the edges of your nails with an emery board or nail file.  Do not cut corns or calluses or try to remove them with medicine.  Wear clean socks or stockings every day. Make sure they are not too tight. Do not wear knee-high stockings since they may decrease blood flow to your legs.  Wear shoes that fit properly and have enough cushioning. To break in new shoes, wear them for just a few hours a day. This prevents you from injuring your feet. Always look in your shoes before you put them on to be sure there are no objects inside.  Do not cross your legs. This may decrease the blood flow to your feet.  If you find a minor scrape,  cut, or break in the skin on your feet, keep it and the skin around it clean and dry. These areas may be cleansed with mild soap and water. Do not cleanse the area with peroxide, alcohol, or iodine.  When you remove an adhesive bandage, be sure not to damage the skin around it.  If you have a wound, look at it several times a day to make sure it is healing.  Do not use heating pads or hot water bottles. They may burn your skin. If you have lost feeling in your feet or legs, you may not know it is happening until it is too late.  Make sure your health care provider performs a complete foot exam at least annually or more often if you have foot problems. Report any cuts, sores, or bruises to your health care provider immediately. SEEK MEDICAL CARE IF:   You have an injury that is not healing.  You have cuts or breaks in the skin.  You have an ingrown nail.  You notice redness on your legs or feet.  You feel burning or tingling in your legs or feet.  You have pain or cramps in your legs and feet.  Your legs or feet are numb.  Your feet always feel cold. SEEK IMMEDIATE MEDICAL CARE IF:   There is increasing redness,   swelling, or pain in or around a wound.  There is a red line that goes up your leg.  Pus is coming from a wound.  You develop a fever or as directed by your health care provider.  You notice a bad smell coming from an ulcer or wound. Document Released: 04/18/2000 Document Revised: 12/22/2012 Document Reviewed: 09/28/2012 Exit[employer] Patient Information 2014 [employer].  

## 2013-07-15 ENCOUNTER — Other Ambulatory Visit: Payer: Medicare HMO

## 2013-07-15 ENCOUNTER — Telehealth: Payer: Self-pay | Admitting: Family Medicine

## 2013-07-15 LAB — POCT UA - MICROALBUMIN: Microalbumin Ur, POC: NEGATIVE mg/L

## 2013-07-15 NOTE — Telephone Encounter (Signed)
Noted will continue to look for samples for her

## 2013-07-15 NOTE — Progress Notes (Signed)
Pt brought urine in for yesterdays MALB. Only

## 2013-07-16 LAB — CMP14+EGFR
ALT: 33 IU/L — ABNORMAL HIGH (ref 0–32)
AST: 29 IU/L (ref 0–40)
Albumin/Globulin Ratio: 2.1 (ref 1.1–2.5)
Albumin: 4.4 g/dL (ref 3.5–4.7)
Alkaline Phosphatase: 60 IU/L (ref 39–117)
BUN/Creatinine Ratio: 19 (ref 11–26)
BUN: 17 mg/dL (ref 8–27)
CO2: 26 mmol/L (ref 18–29)
Calcium: 9.4 mg/dL (ref 8.7–10.3)
Chloride: 99 mmol/L (ref 97–108)
Creatinine, Ser: 0.88 mg/dL (ref 0.57–1.00)
GFR calc Af Amer: 69 mL/min/{1.73_m2} (ref >59)
GFR calc non Af Amer: 60 mL/min/{1.73_m2} (ref >59)
Globulin, Total: 2.1 g/dL (ref 1.5–4.5)
Glucose: 97 mg/dL (ref 65–99)
Potassium: 4.2 mmol/L (ref 3.5–5.2)
Sodium: 140 mmol/L (ref 134–144)
Total Bilirubin: 0.3 mg/dL (ref 0.0–1.2)
Total Protein: 6.5 g/dL (ref 6.0–8.5)

## 2013-07-16 LAB — NMR, LIPOPROFILE
Cholesterol: 192 mg/dL (ref <200)
HDL Cholesterol by NMR: 52 mg/dL (ref >=40)
HDL Particle Number: 30.6 umol/L (ref >=30.5)
LDL Particle Number: 1455 nmol/L — ABNORMAL HIGH (ref <1000)
LDL Size: 21 nm (ref >20.5)
LDLC SERPL CALC-MCNC: 102 mg/dL — ABNORMAL HIGH (ref <100)
LP-IR Score: 56 — ABNORMAL HIGH (ref <=45)
Small LDL Particle Number: 479 nmol/L (ref <=527)
Triglycerides by NMR: 189 mg/dL — ABNORMAL HIGH (ref <150)

## 2013-07-17 ENCOUNTER — Other Ambulatory Visit: Payer: Self-pay | Admitting: Family Medicine

## 2013-07-17 MED ORDER — ROSUVASTATIN CALCIUM 10 MG PO TABS: 10.0000 mg | ORAL_TABLET | Freq: Every day | ORAL | Status: DC

## 2013-07-20 ENCOUNTER — Telehealth: Payer: Self-pay | Admitting: Family Medicine

## 2013-07-21 ENCOUNTER — Telehealth: Payer: Self-pay | Admitting: Family Medicine

## 2013-07-21 ENCOUNTER — Encounter: Payer: Self-pay | Admitting: Nurse Practitioner

## 2013-07-21 ENCOUNTER — Other Ambulatory Visit: Payer: Self-pay | Admitting: Family Medicine

## 2013-07-21 ENCOUNTER — Ambulatory Visit (INDEPENDENT_AMBULATORY_CARE_PROVIDER_SITE_OTHER): Payer: Medicare HMO | Admitting: Nurse Practitioner

## 2013-07-21 VITALS — BP 128/75 | HR 79 | Temp 97.1°F | Ht 60.0 in | Wt 161.2 lb

## 2013-07-21 DIAGNOSIS — N390 Urinary tract infection, site not specified: Secondary | ICD-10-CM

## 2013-07-21 LAB — POCT URINALYSIS DIPSTICK
Bilirubin, UA: NEGATIVE
Glucose, UA: NEGATIVE
Ketones, UA: NEGATIVE
NITRITE UA: POSITIVE
PH UA: 6
PROTEIN UA: NEGATIVE
Spec Grav, UA: 1.01
UROBILINOGEN UA: NEGATIVE

## 2013-07-21 LAB — POCT UA - MICROSCOPIC ONLY
CASTS, UR, LPF, POC: NEGATIVE
CRYSTALS, UR, HPF, POC: NEGATIVE
Mucus, UA: NEGATIVE
YEAST UA: NEGATIVE

## 2013-07-21 MED ORDER — CIPROFLOXACIN HCL 500 MG PO TABS
500.0000 mg | ORAL_TABLET | Freq: Two times a day (BID) | ORAL | Status: DC
Start: 2013-07-21 — End: 2013-07-29

## 2013-07-21 MED ORDER — PRAVASTATIN SODIUM 20 MG PO TABS: 20.0000 mg | ORAL_TABLET | Freq: Every day | ORAL | Status: DC

## 2013-07-21 NOTE — Telephone Encounter (Signed)
NTBS Appt scheduled for this afternoon. Pt agreeable.

## 2013-07-21 NOTE — Patient Instructions (Signed)
Urinary Tract Infection  Urinary tract infections (UTIs) can develop anywhere along your urinary tract. Your urinary tract is your body's drainage system for removing wastes and extra water. Your urinary tract includes two kidneys, two ureters, a bladder, and a urethra. Your kidneys are a pair of bean-shaped organs. Each kidney is about the size of your fist. They are located below your ribs, one on each side of your spine.  CAUSES  Infections are caused by microbes, which are microscopic organisms, including fungi, viruses, and bacteria. These organisms are so small that they can only be seen through a microscope. Bacteria are the microbes that most commonly cause UTIs.  SYMPTOMS   Symptoms of UTIs may vary by age and gender of the patient and by the location of the infection. Symptoms in young women typically include a frequent and intense urge to urinate and a painful, burning feeling in the bladder or urethra during urination. Older women and men are more likely to be tired, shaky, and weak and have muscle aches and abdominal pain. A fever may mean the infection is in your kidneys. Other symptoms of a kidney infection include pain in your back or sides below the ribs, nausea, and vomiting.  DIAGNOSIS  To diagnose a UTI, your caregiver will ask you about your symptoms. Your caregiver also will ask to provide a urine sample. The urine sample will be tested for bacteria and white blood cells. White blood cells are made by your body to help fight infection.  TREATMENT   Typically, UTIs can be treated with medication. Because most UTIs are caused by a bacterial infection, they usually can be treated with the use of antibiotics. The choice of antibiotic and length of treatment depend on your symptoms and the type of bacteria causing your infection.  HOME CARE INSTRUCTIONS   If you were prescribed antibiotics, take them exactly as your caregiver instructs you. Finish the medication even if you feel better after you  have only taken some of the medication.   Drink enough water and fluids to keep your urine clear or pale yellow.   Avoid caffeine, tea, and carbonated beverages. They tend to irritate your bladder.   Empty your bladder often. Avoid holding urine for long periods of time.   Empty your bladder before and after sexual intercourse.   After a bowel movement, women should cleanse from front to back. Use each tissue only once.  SEEK MEDICAL CARE IF:    You have back pain.   You develop a fever.   Your symptoms do not begin to resolve within 3 days.  SEEK IMMEDIATE MEDICAL CARE IF:    You have severe back pain or lower abdominal pain.   You develop chills.   You have nausea or vomiting.   You have continued burning or discomfort with urination.  MAKE SURE YOU:    Understand these instructions.   Will watch your condition.   Will get help right away if you are not doing well or get worse.  Document Released: 01/29/2005 Document Revised: 10/21/2011 Document Reviewed: 05/30/2011  [employer] Patient Information 2014 [employer].

## 2013-07-21 NOTE — Telephone Encounter (Signed)
Call patient : Prescription changed from crestor to pravastatin and her insurance covers pravastatin & sent to pharmacy in [place].

## 2013-07-21 NOTE — Progress Notes (Signed)
   Subjective:    Patient ID: [name], female    DOB: August 09, 1925, 80 y.o.   MRN: [id number]  HPI Patient here today with c/o dysuria and frequency. Started 2 days ago.    Review of Systems  Constitutional: Negative.   Respiratory: Negative.   Cardiovascular: Negative.   Genitourinary: Positive for dysuria, urgency and frequency.  All other systems reviewed and are negative.       Objective:   Physical Exam  Constitutional: She appears well-developed and well-nourished.  Cardiovascular: Normal rate, regular rhythm and normal heart sounds.   Pulmonary/Chest: Effort normal and breath sounds normal.  Abdominal: Soft. Bowel sounds are normal. She exhibits no distension and no mass. There is no tenderness. There is no rebound and no guarding.  Genitourinary:   CVA tenderness   BP 128/75  Pulse 79  Temp(Src) 97.1 F (36.2 C) (Oral)  Ht 5' (1.524 m)  Wt 161 lb 3.2 oz (73.12 kg)  BMI 31.48 kg/m2       Assessment & Plan:   1. UTI (urinary tract infection)    Meds ordered this encounter  Medications  . ciprofloxacin (CIPRO) 500 MG tablet    Sig: Take 1 tablet (500 mg total) by mouth 2 (two) times daily.    Dispense:  14 tablet    Refill:  0    Order Specific Question:  Supervising Provider    Answer:  Joycelyn Man   Force fluids AZO over the counter X2 days RTO prn Culture pending  Mary-Margaret Hassell Done, FNP

## 2013-07-21 NOTE — Telephone Encounter (Signed)
Patient aware.

## 2013-07-29 ENCOUNTER — Encounter: Payer: Self-pay | Admitting: Family Medicine

## 2013-07-29 ENCOUNTER — Ambulatory Visit (INDEPENDENT_AMBULATORY_CARE_PROVIDER_SITE_OTHER): Payer: Medicare HMO | Admitting: Family Medicine

## 2013-07-29 VITALS — BP 121/65 | HR 47 | Temp 98.0°F | Ht 60.0 in | Wt 158.8 lb

## 2013-07-29 DIAGNOSIS — I6350 Cerebral infarction due to unspecified occlusion or stenosis of unspecified cerebral artery: Secondary | ICD-10-CM

## 2013-07-29 DIAGNOSIS — E785 Hyperlipidemia, unspecified: Secondary | ICD-10-CM

## 2013-07-29 DIAGNOSIS — M1990 Unspecified osteoarthritis, unspecified site: Secondary | ICD-10-CM

## 2013-07-29 DIAGNOSIS — R202 Paresthesia of skin: Secondary | ICD-10-CM

## 2013-07-29 DIAGNOSIS — R209 Unspecified disturbances of skin sensation: Secondary | ICD-10-CM

## 2013-07-29 DIAGNOSIS — I10 Essential (primary) hypertension: Secondary | ICD-10-CM

## 2013-07-29 DIAGNOSIS — K219 Gastro-esophageal reflux disease without esophagitis: Secondary | ICD-10-CM

## 2013-07-29 DIAGNOSIS — M797 Fibromyalgia: Secondary | ICD-10-CM

## 2013-07-29 DIAGNOSIS — N390 Urinary tract infection, site not specified: Secondary | ICD-10-CM

## 2013-07-29 DIAGNOSIS — J8410 Pulmonary fibrosis, unspecified: Secondary | ICD-10-CM

## 2013-07-29 DIAGNOSIS — E119 Type 2 diabetes mellitus without complications: Secondary | ICD-10-CM

## 2013-07-29 DIAGNOSIS — IMO0001 Reserved for inherently not codable concepts without codable children: Secondary | ICD-10-CM

## 2013-07-29 MED ORDER — CIPROFLOXACIN HCL 500 MG PO TABS: 500.0000 mg | ORAL_TABLET | Freq: Two times a day (BID) | ORAL | Status: DC

## 2013-07-29 NOTE — Patient Instructions (Addendum)
1 tablespoon of yellow mustard per day for cramps.!  Cut the diltiazem in half.  If sugars are less than 100. Reduce the lantus by 5 units every 1 weeks until you stay at 100 to 110.

## 2013-07-29 NOTE — Progress Notes (Signed)
Patient ID: [name], female   DOB: 05/09/1925, 80 y.o.   MRN: [id number] SUBJECTIVE: CC: Chief Complaint  Patient presents with  . Follow-up    2 wk ck up  woiuld like to have rx for uti c/o severe muscle aches in legs and mild pain in hands and feet and staes not sleeping     HPI: Patient is here for follow up of Diabetes Mellitus/HLD/HTN: Symptoms evaluated: Denies Nocturia ,Denies Urinary Frequency , denies Blurred vision ,deniesDizziness,denies.Dysuria  paresthesias,of fingers and toes.   denies extremity pain or ulcers.Marland Kitchendenies chest pain. has had an annual eye exam. do check the feet. Does check CBGs. Average CBG:<100 Denies episodes of hypoglycemia. Does have an emergency hypoglycemic plan. admits toCompliance with medications. Denies Problems with medications.  Past Medical History  Diagnosis Date  . Diabetes mellitus without complication   . Hyperlipidemia   . Hypertension    Past Surgical History  Procedure Laterality Date  . Appendectomy    . Cholecystectomy    . Tonsillectomy    . Joint replacement      bilat knee    History   Social History  . Marital Status: Married    Spouse Name: N/A    Number of Children: N/A  . Years of Education: N/A   Occupational History  . Not on file.   Social History Main Topics  . Smoking status: Never Smoker   . Smokeless tobacco: Not on file  . Alcohol Use: No  . Drug Use: No  . Sexual Activity: Not on file   Other Topics Concern  . Not on file   Social History Narrative  . No narrative on file   Family History  Problem Relation Age of Onset  . Cancer Mother   . Cancer Father   . Cancer Brother    Current Outpatient Prescriptions on File Prior to Visit  Medication Sig Dispense Refill  . amLODipine (NORVASC) 5 MG tablet Take 1 tablet (5 mg total) by mouth daily.  90 tablet  1  . Aspirin-Caffeine (BAYER BACK & BODY PAIN EX ST PO) Take by mouth.      . B-D ULTRAFINE III SHORT PEN 31G X 8 MM MISC USE  AS DIRECTED  100 each  2  . Calcium Carbonate-Vit D-Min (CALCIUM 1200 PO) Take 1 tablet by mouth 2 (two) times daily.      Marland Kitchen diltiazem (CARDIZEM) 120 MG tablet Take 0.5 tablets (60 mg total) by mouth daily.  30 tablet  0  . glucose blood (ONE TOUCH ULTRA TEST) test strip Use as instructed  100 each  3  . insulin glargine (LANTUS) 100 UNIT/ML injection Inject 0.3 mLs (30 Units total) into the skin every evening.  30 mL  0  . Lancets (ONETOUCH ULTRASOFT) lancets Use as instructed once daily  100 each  3  . MAGNESIUM-ZINC PO Take 15 mg by mouth 3 (three) times daily.      . metFORMIN (GLUCOPHAGE) 500 MG tablet Take 500 mg by mouth daily with breakfast.      . olmesartan-hydrochlorothiazide (BENICAR HCT) 20-12.5 MG per tablet Take 1 tablet by mouth daily.  90 tablet  0  . omeprazole (PRILOSEC) 20 MG capsule Take 1 capsule (20 mg total) by mouth 2 (two) times daily before a meal.  180 capsule  0  . pravastatin (PRAVACHOL) 20 MG tablet Take 1 tablet (20 mg total) by mouth daily.  90 tablet  3   No current facility-administered medications on file  prior to visit.   Allergies  Allergen Reactions  . Ace Inhibitors   . Sulfa Antibiotics Nausea And Vomiting   Immunization History  Administered Date(s) Administered  . Influenza Whole 03/07/2005  . Influenza,inj,Quad PF,36+ Mos 02/11/2013  . Tdap 01/04/2011  . Zoster 05/29/2012   Prior to Admission medications   Medication Sig Start Date End Date Taking? Authorizing Provider  amLODipine (NORVASC) 5 MG tablet Take 1 tablet (5 mg total) by mouth daily. 07/14/13  Yes Vernie Shanks, MD  Aspirin-Caffeine (BAYER BACK & BODY PAIN EX ST PO) Take by mouth.   Yes Historical Provider, MD  B-D ULTRAFINE III SHORT PEN 31G X 8 MM MISC USE AS DIRECTED 03/10/13  Yes Chipper Herb, MD  Calcium Carbonate-Vit D-Min (CALCIUM 1200 PO) Take 1 tablet by mouth 2 (two) times daily.   Yes Historical Provider, MD  ciprofloxacin (CIPRO) 500 MG tablet Take 1 tablet (500 mg  total) by mouth 2 (two) times daily. 07/21/13  Yes Mary-Margaret Hassell Done, FNP  CRESTOR 10 MG tablet  07/19/13  Yes Historical Provider, MD  diltiazem (CARDIZEM) 120 MG tablet Take 0.5 tablets (60 mg total) by mouth daily. 07/14/13  Yes Vernie Shanks, MD  glucose blood (ONE TOUCH ULTRA TEST) test strip Use as instructed 02/11/13  Yes Vernie Shanks, MD  insulin glargine (LANTUS) 100 UNIT/ML injection Inject 0.3 mLs (30 Units total) into the skin every evening. 05/12/13  Yes Vernie Shanks, MD  Lancets [facility] ULTRASOFT) lancets Use as instructed once daily 02/11/13  Yes Vernie Shanks, MD  MAGNESIUM-ZINC PO Take 15 mg by mouth 3 (three) times daily.   Yes Historical Provider, MD  metFORMIN (GLUCOPHAGE) 500 MG tablet Take 500 mg by mouth daily with breakfast. 05/12/13  Yes Vernie Shanks, MD  olmesartan-hydrochlorothiazide (BENICAR HCT) 20-12.5 MG per tablet Take 1 tablet by mouth daily. 05/12/13  Yes Vernie Shanks, MD  omeprazole (PRILOSEC) 20 MG capsule Take 1 capsule (20 mg total) by mouth 2 (two) times daily before a meal. 05/12/13  Yes Vernie Shanks, MD  pravastatin (PRAVACHOL) 20 MG tablet Take 1 tablet (20 mg total) by mouth daily. 07/21/13   Vernie Shanks, MD     ROS: As above in the HPI. All other systems are stable or negative.  OBJECTIVE: APPEARANCE:  Patient in no acute distress.The patient appeared well nourished and normally developed. Acyanotic. Waist: VITAL SIGNS:BP 121/65  Pulse 47  Temp(Src) 98 F (36.7 C) (Oral)  Ht 5' (1.524 m)  Wt 158 lb 12.8 oz (72.031 kg)  BMI 31.01 kg/m2  WF Obese SKIN: warm and  Dry without overt rashes, tattoos and scars  HEAD and Neck: without JVD, Head and scalp: normal Eyes:No scleral icterus. Fundi normal, eye movements normal. Ears: Auricle normal, canal normal, Tympanic membranes normal, insufflation normal. Nose: normal Throat: normal Neck & thyroid: normal  CHEST & LUNGS: Chest wall: normal Lungs: Clear  CVS: Reveals the PMI to be  normally located. Regular rhythm, First and Second Heart sounds are normal,  absence of murmurs, rubs or gallops. Peripheral vasculature: Radial pulses: normal Dorsal pedis pulses: normal Posterior pulses: normal  ABDOMEN:  Appearance: Obese Benign, no organomegaly, no masses, no Abdominal Aortic enlargement. No Guarding , no rebound. No Bruits. Bowel sounds: normal  RECTAL: N/A GU: N/A  EXTREMETIES: nonedematous.  MUSCULOSKELETAL:  Spine: normal Joints: intact  NEUROLOGIC: oriented to time,place and person; nonfocal. Strength is normal Sensory is normal Reflexes are normal Cranial Nerves are  normal. Results for orders placed in visit on 07/21/13  POCT URINALYSIS DIPSTICK      Result Value Ref Range   Color, UA turbid     Clarity, UA cloudy     Glucose, UA neg     Bilirubin, UA neg     Ketones, UA neg     Spec Grav, UA 1.010     Blood, UA mod     pH, UA 6.0     Protein, UA neg     Urobilinogen, UA negative     Nitrite, UA pos     Leukocytes, UA moderate (2+)    POCT UA - MICROSCOPIC ONLY      Result Value Ref Range   WBC, Ur, HPF, POC 80-100     RBC, urine, microscopic 1-5     Bacteria, U Microscopic many     Mucus, UA neg     Epithelial cells, urine per micros few     Crystals, Ur, HPF, POC neg     Casts, Ur, LPF, POC neg     Yeast, UA neg      ASSESSMENT:  HTN (hypertension)  HLD (hyperlipidemia)  PULMONARY FIBROSIS, POSTINFLAMMATORY  DM (diabetes mellitus)  CVA  OSTEOARTHRITIS  GERD  Fibromyalgia  Paresthesias - Plan: TSH, Vitamin B12, Vit D  25 hydroxy (rtn osteoporosis monitoring), Folate  UTI (urinary tract infection) - Plan: ciprofloxacin (CIPRO) 500 MG tablet  PLAN: 1 tablespoon of yellow mustard per day for cramps.!  Cut the diltiazem in half.  If sugars are less than 100. Reduce the lantus by 5 units every 1 weeks until you stay at 100 to 110.  Orders Placed This Encounter  Procedures  . TSH  . Vitamin B12  . Vit D  25  hydroxy (rtn osteoporosis monitoring)  . Folate   Meds ordered this encounter  Medications  . CRESTOR 10 MG tablet    Sig:   . ciprofloxacin (CIPRO) 500 MG tablet    Sig: Take 1 tablet (500 mg total) by mouth 2 (two) times daily.    Dispense:  14 tablet    Refill:  0    Order Specific Question:  Supervising Provider    Answer:  Chipper Herb [[id number]]   Medications Discontinued During This Encounter  Medication Reason  . ciprofloxacin (CIPRO) 500 MG tablet Reorder   Return in about 4 weeks (around 08/26/2013) for Recheck medical problems.  Lillyn Wieczorek P. Jacelyn Grip, M.D.

## 2013-07-30 LAB — TSH: TSH: 0.719 u[IU]/mL (ref 0.450–4.500)

## 2013-07-30 LAB — VITAMIN B12: Vitamin B-12: >1999 pg/mL — ABNORMAL HIGH (ref 211–946)

## 2013-07-30 LAB — FOLATE: Folate: >19.9 ng/mL (ref >3.0)

## 2013-07-30 LAB — VITAMIN D 25 HYDROXY (VIT D DEFICIENCY, FRACTURES): Vit D, 25-Hydroxy: 45.7 ng/mL (ref 30.0–100.0)

## 2013-07-30 NOTE — Progress Notes (Signed)
Quick Note:  Call patient. Labs normal. No change in plan. ______ 

## 2013-08-15 ENCOUNTER — Telehealth: Payer: Self-pay | Admitting: Family Medicine

## 2013-08-15 NOTE — Telephone Encounter (Signed)
Tried to contact patient but her voicemail was not setup.  I'm not certain of the other medication's name. I'm guessing Benicar but will need to clarify with patient.

## 2013-08-15 NOTE — Telephone Encounter (Signed)
Pt aware no samples available . 

## 2013-08-18 ENCOUNTER — Telehealth: Payer: Self-pay | Admitting: Family Medicine

## 2013-08-22 ENCOUNTER — Other Ambulatory Visit: Payer: Self-pay | Admitting: Family Medicine

## 2013-08-22 DIAGNOSIS — E119 Type 2 diabetes mellitus without complications: Secondary | ICD-10-CM

## 2013-08-22 DIAGNOSIS — K219 Gastro-esophageal reflux disease without esophagitis: Secondary | ICD-10-CM

## 2013-08-22 DIAGNOSIS — I6350 Cerebral infarction due to unspecified occlusion or stenosis of unspecified cerebral artery: Secondary | ICD-10-CM

## 2013-08-22 DIAGNOSIS — I10 Essential (primary) hypertension: Secondary | ICD-10-CM

## 2013-08-22 DIAGNOSIS — E785 Hyperlipidemia, unspecified: Secondary | ICD-10-CM

## 2013-08-22 MED ORDER — OLMESARTAN MEDOXOMIL-HCTZ 20-12.5 MG PO TABS: 1.0000 | ORAL_TABLET | Freq: Every day | ORAL | Status: DC

## 2013-08-22 MED ORDER — METFORMIN HCL 500 MG PO TABS: 500.0000 mg | ORAL_TABLET | Freq: Every day | ORAL | Status: DC

## 2013-08-22 MED ORDER — PRAVASTATIN SODIUM 40 MG PO TABS: 40.0000 mg | ORAL_TABLET | Freq: Every day | ORAL | Status: DC

## 2013-08-22 MED ORDER — OMEPRAZOLE 20 MG PO CPDR: 20.0000 mg | DELAYED_RELEASE_CAPSULE | Freq: Two times a day (BID) | ORAL | Status: DC

## 2013-08-22 MED ORDER — INSULIN GLARGINE 100 UNIT/ML ~~LOC~~ SOLN: 30.0000 [IU] | Freq: Every evening | SUBCUTANEOUS | Status: DC

## 2013-08-22 MED ORDER — DILTIAZEM HCL 120 MG PO TABS: 60.0000 mg | ORAL_TABLET | Freq: Every day | ORAL | Status: DC

## 2013-08-22 MED ORDER — AMLODIPINE BESYLATE 5 MG PO TABS: 5.0000 mg | ORAL_TABLET | Freq: Every day | ORAL | Status: DC

## 2013-08-22 NOTE — Telephone Encounter (Signed)
Rx ready for pick up. 

## 2013-08-22 NOTE — Telephone Encounter (Signed)
Pt notified and aware of change from crestor in which is to be stopped and will be starting pravastatin. Pt will pick up written rx's for [facility]

## 2013-08-29 ENCOUNTER — Ambulatory Visit: Payer: Medicare HMO | Admitting: Family Medicine

## 2013-08-30 ENCOUNTER — Ambulatory Visit: Payer: Medicare HMO | Admitting: Family Medicine

## 2013-09-02 ENCOUNTER — Encounter: Payer: Self-pay | Admitting: Family Medicine

## 2013-09-02 ENCOUNTER — Ambulatory Visit (INDEPENDENT_AMBULATORY_CARE_PROVIDER_SITE_OTHER): Payer: Medicare HMO | Admitting: Family Medicine

## 2013-09-02 VITALS — BP 123/77 | HR 80 | Temp 97.4°F | Ht 60.0 in | Wt 158.0 lb

## 2013-09-02 DIAGNOSIS — E785 Hyperlipidemia, unspecified: Secondary | ICD-10-CM

## 2013-09-02 DIAGNOSIS — I10 Essential (primary) hypertension: Secondary | ICD-10-CM

## 2013-09-02 DIAGNOSIS — J8410 Pulmonary fibrosis, unspecified: Secondary | ICD-10-CM

## 2013-09-02 DIAGNOSIS — M1990 Unspecified osteoarthritis, unspecified site: Secondary | ICD-10-CM

## 2013-09-02 DIAGNOSIS — IMO0001 Reserved for inherently not codable concepts without codable children: Secondary | ICD-10-CM

## 2013-09-02 DIAGNOSIS — E119 Type 2 diabetes mellitus without complications: Secondary | ICD-10-CM

## 2013-09-02 DIAGNOSIS — K219 Gastro-esophageal reflux disease without esophagitis: Secondary | ICD-10-CM

## 2013-09-02 DIAGNOSIS — M797 Fibromyalgia: Secondary | ICD-10-CM

## 2013-09-02 DIAGNOSIS — I6350 Cerebral infarction due to unspecified occlusion or stenosis of unspecified cerebral artery: Secondary | ICD-10-CM

## 2013-09-02 NOTE — Patient Instructions (Signed)
Insomnia Insomnia is frequent trouble falling and/or staying asleep. Insomnia can be a long term problem or a short term problem. Both are common. Insomnia can be a short term problem when the wakefulness is related to a certain stress or worry. Long term insomnia is often related to ongoing stress during waking hours and/or poor sleeping habits. Overtime, sleep deprivation itself can make the problem worse. Every little thing feels more severe because you are overtired and your ability to cope is decreased. CAUSES   Stress, anxiety, and depression.  Poor sleeping habits.  Distractions such as TV in the bedroom.  Naps close to bedtime.  Engaging in emotionally charged conversations before bed.  Technical reading before sleep.  Alcohol and other sedatives. They may make the problem worse. They can hurt normal sleep patterns and normal dream activity.  Stimulants such as caffeine for several hours prior to bedtime.  Pain syndromes and shortness of breath can cause insomnia.  Exercise late at night.  Changing time zones may cause sleeping problems (jet lag). It is sometimes helpful to have someone observe your sleeping patterns. They should look for periods of not breathing during the night (sleep apnea). They should also look to see how long those periods last. If you live alone or observers are uncertain, you can also be observed at a sleep clinic where your sleep patterns will be professionally monitored. Sleep apnea requires a checkup and treatment. Give your caregivers your medical history. Give your caregivers observations your family has made about your sleep.  SYMPTOMS   Not feeling rested in the morning.  Anxiety and restlessness at bedtime.  Difficulty falling and staying asleep. TREATMENT   Your caregiver may prescribe treatment for an underlying medical disorders. Your caregiver can give advice or help if you are using alcohol or other drugs for self-medication. Treatment  of underlying problems will usually eliminate insomnia problems.  Medications can be prescribed for short time use. They are generally not recommended for lengthy use.  Over-the-counter sleep medicines are not recommended for lengthy use. They can be habit forming.  You can promote easier sleeping by making lifestyle changes such as:  Using relaxation techniques that help with breathing and reduce muscle tension.  Exercising earlier in the day.  Changing your diet and the time of your last meal. No night time snacks.  Establish a regular time to go to bed.  Counseling can help with stressful problems and worry.  Soothing music and white noise may be helpful if there are background noises you cannot remove.  Stop tedious detailed work at least one hour before bedtime. HOME CARE INSTRUCTIONS   Keep a diary. Inform your caregiver about your progress. This includes any medication side effects. See your caregiver regularly. Take note of:  Times when you are asleep.  Times when you are awake during the night.  The quality of your sleep.  How you feel the next day. This information will help your caregiver care for you.  Get out of bed if you are still awake after 15 minutes. Read or do some quiet activity. Keep the lights down. Wait until you feel sleepy and go back to bed.  Keep regular sleeping and waking hours. Avoid naps.  Exercise regularly.  Avoid distractions at bedtime. Distractions include watching television or engaging in any intense or detailed activity like attempting to balance the household checkbook.  Develop a bedtime ritual. Keep a familiar routine of bathing, brushing your teeth, climbing into bed at the same   time each night, listening to soothing music. Routines increase the success of falling to sleep faster.  Use relaxation techniques. This can be using breathing and muscle tension release routines. It can also include visualizing peaceful scenes. You can  also help control troubling or intruding thoughts by keeping your mind occupied with boring or repetitive thoughts like the old concept of counting sheep. You can make it more creative like imagining planting one beautiful flower after another in your backyard garden.  During your day, work to eliminate stress. When this is not possible use some of the previous suggestions to help reduce the anxiety that accompanies stressful situations. MAKE SURE YOU:   Understand these instructions.  Will watch your condition.  Will get help right away if you are not doing well or get worse. Document Released: 04/18/2000 Document Revised: 07/14/2011 Document Reviewed: 05/19/2007 [facility] [place] Patient Information 2014 [place].  DASH Diet The DASH diet stands for "Dietary Approaches to Stop Hypertension." It is a healthy eating plan that has been shown to reduce high blood pressure (hypertension) in as little as 14 days, while also possibly providing other significant health benefits. These other health benefits include reducing the risk of breast cancer after menopause and reducing the risk of type 2 diabetes, heart disease, colon cancer, and stroke. Health benefits also include weight loss and slowing kidney failure in patients with chronic kidney disease.  DIET GUIDELINES  Limit salt (sodium). Your diet should contain less than 1500 mg of sodium daily.  Limit refined or processed carbohydrates. Your diet should include mostly whole grains. Desserts and added sugars should be used sparingly.  Include small amounts of heart-healthy fats. These types of fats include nuts, oils, and tub margarine. Limit saturated and trans fats. These fats have been shown to be harmful in the body. CHOOSING FOODS  The following food groups are based on a 2000 calorie diet. See your Registered Dietitian for individual calorie needs. Grains and Grain Products (6 to 8 servings daily)  Eat More Often: Whole-wheat bread, brown  rice, whole-grain or wheat pasta, quinoa, popcorn without added fat or salt (air popped).  Eat Less Often: White bread, white pasta, white rice, cornbread. Vegetables (4 to 5 servings daily)  Eat More Often: Fresh, frozen, and canned vegetables. Vegetables may be raw, steamed, roasted, or grilled with a minimal amount of fat.  Eat Less Often/Avoid: Creamed or fried vegetables. Vegetables in a cheese sauce. Fruit (4 to 5 servings daily)  Eat More Often: All fresh, canned (in natural juice), or frozen fruits. Dried fruits without added sugar. One hundred percent fruit juice ( cup [237 mL] daily).  Eat Less Often: Dried fruits with added sugar. Canned fruit in light or heavy syrup. [employer], Fish, and Poultry (2 servings or less daily. One serving is 3 to 4 oz [85-114 g]).  Eat More Often: Ninety percent or leaner ground beef, tenderloin, sirloin. Round cuts of beef, chicken breast, [place] breast. All fish. Grill, bake, or broil your meat. Nothing should be fried.  Eat Less Often/Avoid: Fatty cuts of meat, [place], or chicken leg, thigh, or wing. Fried cuts of meat or fish. Dairy (2 to 3 servings)  Eat More Often: Low-fat or fat-free milk, low-fat plain or light yogurt, reduced-fat or part-skim cheese.  Eat Less Often/Avoid: Milk (whole, 2%).Whole milk yogurt. Full-fat cheeses. Nuts, Seeds, and Legumes (4 to 5 servings per week)  Eat More Often: All without added salt.  Eat Less Often/Avoid: Salted nuts and seeds, canned beans  with added salt. Fats and Sweets (limited)  Eat More Often: Vegetable oils, tub margarines without trans fats, sugar-free gelatin. Mayonnaise and salad dressings.  Eat Less Often/Avoid: Coconut oils, palm oils, butter, stick margarine, cream, half and half, cookies, candy, pie. FOR MORE INFORMATION The Dash Diet Eating Plan: www.dashdiet.org Document Released: 04/10/2011 Document Revised: 07/14/2011 Document Reviewed: 04/10/2011 [facility] Patient Information  2014 [place], [place].

## 2013-09-02 NOTE — Progress Notes (Signed)
Patient ID: [name], female   DOB: 05-Aug-1925, 80 y.o.   MRN: [id number] SUBJECTIVE: CC: Chief Complaint  Patient presents with  . Medical Management of Chronic Issues  . Insomnia  . Edema    lower extremity edema    HPI: Patient is here for follow up of Diabetes Mellitus/HLD: Symptoms evaluated: Denies Nocturia ,Denies Urinary Frequency , denies Blurred vision ,deniesDizziness,denies.Dysuria,denies paresthesias, denies extremity pain or ulcers.Marland Kitchendenies chest pain. has had an annual eye exam. do check the feet. Does check CBGs. Average CBG:90-110s Denies episodes of hypoglycemia. Does have an emergency hypoglycemic plan. admits toCompliance with medications. Denies Problems with medications.  Patient is here for follow up of hypertension: denies Headache;deniesChest Pain;denies weakness;denies Shortness of Breath or Orthopnea;denies Visual changes;denies palpitations;denies cough;denies pedal edema;denies symptoms of TIA or stroke; admits to Compliance with medications. denies Problems with medications.  Past Medical History  Diagnosis Date  . Diabetes mellitus without complication   . Hyperlipidemia   . Hypertension    Past Surgical History  Procedure Laterality Date  . Appendectomy    . Cholecystectomy    . Tonsillectomy    . Joint replacement      bilat knee    History   Social History  . Marital Status: Married    Spouse Name: N/A    Number of Children: N/A  . Years of Education: N/A   Occupational History  . Not on file.   Social History Main Topics  . Smoking status: Never Smoker   . Smokeless tobacco: Not on file  . Alcohol Use: No  . Drug Use: No  . Sexual Activity: Not on file   Other Topics Concern  . Not on file   Social History Narrative  . No narrative on file   Family History  Problem Relation Age of Onset  . Cancer Mother   . Cancer Father   . Cancer Brother    Current Outpatient Prescriptions on File Prior to Visit   Medication Sig Dispense Refill  . Aspirin-Caffeine (BAYER BACK & BODY PAIN EX ST PO) Take by mouth.      . B-D ULTRAFINE III SHORT PEN 31G X 8 MM MISC USE AS DIRECTED  100 each  2  . Calcium Carbonate-Vit D-Min (CALCIUM 1200 PO) Take 1 tablet by mouth every other day.       Marland Kitchen glucose blood (ONE TOUCH ULTRA TEST) test strip Use as instructed  100 each  3  . insulin glargine (LANTUS) 100 UNIT/ML injection Inject 0.3 mLs (30 Units total) into the skin every evening.  30 mL  11  . Lancets (ONETOUCH ULTRASOFT) lancets Use as instructed once daily  100 each  3  . MAGNESIUM-ZINC PO Take 15 mg by mouth 3 (three) times daily.      . metFORMIN (GLUCOPHAGE) 500 MG tablet Take 1 tablet (500 mg total) by mouth daily with breakfast.  90 tablet  3  . olmesartan-hydrochlorothiazide (BENICAR HCT) 20-12.5 MG per tablet Take 1 tablet by mouth daily.  90 tablet  1  . omeprazole (PRILOSEC) 20 MG capsule Take 1 capsule (20 mg total) by mouth 2 (two) times daily before a meal.  180 capsule  1  . pravastatin (PRAVACHOL) 40 MG tablet Take 1 tablet (40 mg total) by mouth daily.  90 tablet  1   No current facility-administered medications on file prior to visit.   Allergies  Allergen Reactions  . Ace Inhibitors   . Sulfa Antibiotics Nausea And Vomiting  Immunization History  Administered Date(s) Administered  . Influenza Whole 03/07/2005  . Influenza,inj,Quad PF,36+ Mos 02/11/2013  . Tdap 01/04/2011  . Zoster 05/29/2012   Prior to Admission medications   Medication Sig Start Date End Date Taking? Authorizing Provider  amLODipine (NORVASC) 5 MG tablet Take 1 tablet (5 mg total) by mouth daily. 08/22/13  Yes Vernie Shanks, MD  Aspirin-Caffeine (BAYER BACK & BODY PAIN EX ST PO) Take by mouth.   Yes Historical Provider, MD  B-D ULTRAFINE III SHORT PEN 31G X 8 MM MISC USE AS DIRECTED 03/10/13  Yes Chipper Herb, MD  Calcium Carbonate-Vit D-Min (CALCIUM 1200 PO) Take 1 tablet by mouth every other day.    Yes  Historical Provider, MD  diltiazem (CARDIZEM) 120 MG tablet Take 0.5 tablets (60 mg total) by mouth daily. 08/22/13  Yes Vernie Shanks, MD  glucose blood (ONE TOUCH ULTRA TEST) test strip Use as instructed 02/11/13  Yes Vernie Shanks, MD  insulin glargine (LANTUS) 100 UNIT/ML injection Inject 0.3 mLs (30 Units total) into the skin every evening. 08/22/13  Yes Vernie Shanks, MD  Lancets [facility] ULTRASOFT) lancets Use as instructed once daily 02/11/13  Yes Vernie Shanks, MD  MAGNESIUM-ZINC PO Take 15 mg by mouth 3 (three) times daily.   Yes Historical Provider, MD  metFORMIN (GLUCOPHAGE) 500 MG tablet Take 1 tablet (500 mg total) by mouth daily with breakfast. 08/22/13  Yes Vernie Shanks, MD  olmesartan-hydrochlorothiazide (BENICAR HCT) 20-12.5 MG per tablet Take 1 tablet by mouth daily. 08/22/13  Yes Vernie Shanks, MD  omeprazole (PRILOSEC) 20 MG capsule Take 1 capsule (20 mg total) by mouth 2 (two) times daily before a meal. 08/22/13  Yes Vernie Shanks, MD  pravastatin (PRAVACHOL) 40 MG tablet Take 1 tablet (40 mg total) by mouth daily. 08/22/13  Yes Vernie Shanks, MD     ROS: As above in the HPI. All other systems are stable or negative.  OBJECTIVE: APPEARANCE:  Patient in no acute distress.The patient appeared well nourished and normally developed. Acyanotic. Waist: VITAL SIGNS:BP 123/77  Pulse 80  Temp(Src) 97.4 F (36.3 C) (Oral)  Ht 5' (1.524 m)  Wt 158 lb (71.668 kg)  BMI 30.86 kg/m2  Obese WF  SKIN: warm and  Dry without overt rashes, tattoos and scars  HEAD and Neck: without JVD, Head and scalp: normal Eyes:No scleral icterus. Fundi normal, eye movements normal. Ears: Auricle normal, canal normal, Tympanic membranes normal, insufflation normal. Nose: normal Throat: normal Neck & thyroid: normal  CHEST & LUNGS: Chest wall: normal Lungs: Clear  CVS: Reveals the PMI to be normally located. Regular rhythm, First and Second Heart sounds are normal,  absence of  murmurs, rubs or gallops. Peripheral vasculature: Radial pulses: normal Dorsal pedis pulses: normal Posterior pulses: normal  ABDOMEN:  Appearance: Obese Benign, no organomegaly, no masses, no Abdominal Aortic enlargement. No Guarding , no rebound. No Bruits. Bowel sounds: normal  RECTAL: N/A GU: N/A  EXTREMETIES: nonedematous.  MUSCULOSKELETAL:  Spine: normal Joints: intact  NEUROLOGIC: oriented to time,place and person; nonfocal. Strength is normal Sensory is normal Reflexes are normal Cranial Nerves are normal.  ASSESSMENT: PULMONARY FIBROSIS, POSTINFLAMMATORY  HTN (hypertension) - Plan: amLODipine (NORVASC) 5 MG tablet, diltiazem (CARDIZEM) 120 MG tablet  HLD (hyperlipidemia)  DM (diabetes mellitus)  CVA  OSTEOARTHRITIS  GERD  Fibromyalgia Patient was being weaned off the diltiazem and increase on the amlodipine to consolidate her medications to an easier regimen. However  she has had well controlled BPs but the heart rate has been increasing to 80s and 90s. Prefers to be back on her old regimen   PLAN:  Agree with patient, I have concerns that she may go into a tachycardic rate and therefor ewill leave her regimen back at what it was before.  Handout on the DASH diet and Insomnia.   No orders of the defined types were placed in this encounter.   Meds ordered this encounter  Medications  . amLODipine (NORVASC) 5 MG tablet    Sig: Take 0.5 tablets (2.5 mg total) by mouth daily.    Dispense:  90 tablet    Refill:  1  . diltiazem (CARDIZEM) 120 MG tablet    Sig: Take 1 tablet (120 mg total) by mouth daily.    Dispense:  60 tablet    Refill:  0   Medications Discontinued During This Encounter  Medication Reason  . ciprofloxacin (CIPRO) 500 MG tablet Completed Course  . amLODipine (NORVASC) 5 MG tablet Reorder  . diltiazem (CARDIZEM) 120 MG tablet Reorder   Return in about 3 months (around 12/03/2013) for Recheck medical problems.  Vishwa Dais P.  Jacelyn Grip, M.D.

## 2013-09-05 ENCOUNTER — Telehealth: Payer: Self-pay | Admitting: Family Medicine

## 2013-09-05 NOTE — Telephone Encounter (Signed)
She is mixing up the medications. She was to reduce the amlodipine to a half a tablet daily and increase back the diltiazem. The amlodipine is what is causing the swelling of her feet. She is to continue the pravastatin. It should not be giving her a problem.

## 2013-09-05 NOTE — Telephone Encounter (Signed)
Discussed medication doses with patient. She will f/u if necessary.

## 2013-09-07 ENCOUNTER — Telehealth: Payer: Self-pay | Admitting: Family Medicine

## 2013-09-07 NOTE — Telephone Encounter (Signed)
She has had swollen ankles since starting the pravastatin. Could this medicine cause it?

## 2013-09-08 NOTE — Telephone Encounter (Signed)
Discussed with patient. She has not reduced back the amlodipine to 1/2 tablet as instructed. The amlodipine is the most likely culprit to cause the pedal edema. She will start that today and have a follow up soon.

## 2013-09-09 ENCOUNTER — Telehealth: Payer: Self-pay | Admitting: Family Medicine

## 2013-09-09 NOTE — Telephone Encounter (Signed)
It takes a couple of days for the leg swelling to go down when the amlodipine is reduced. However, if she is uncomfortable and thinks she needs a diuretic , she will need to be seen today or tomorrow to be  Re-evaluated.

## 2013-09-09 NOTE — Telephone Encounter (Signed)
Per Dr. Jacelyn Grip he feels like she needs to give it a couple of more days for the amlodipine to decrease out of her system but if really uncomfortable then needs to be seen today with a provider. I spoke patient and she agrees and understands

## 2013-09-09 NOTE — Telephone Encounter (Signed)
I advised patient that she probably needed to be seen but she really wanted Dr. Jacelyn Grip to look at this message first. She has cut back on the amlodipine as you had suggested and the swelling still has not improved

## 2013-09-13 ENCOUNTER — Encounter: Payer: Self-pay | Admitting: Family Medicine

## 2013-09-13 ENCOUNTER — Ambulatory Visit (INDEPENDENT_AMBULATORY_CARE_PROVIDER_SITE_OTHER): Payer: Medicare HMO | Admitting: Family Medicine

## 2013-09-13 VITALS — BP 123/73 | HR 89 | Temp 97.0°F | Ht 60.0 in | Wt 159.0 lb

## 2013-09-13 DIAGNOSIS — N390 Urinary tract infection, site not specified: Secondary | ICD-10-CM

## 2013-09-13 DIAGNOSIS — R300 Dysuria: Secondary | ICD-10-CM

## 2013-09-13 DIAGNOSIS — R309 Painful micturition, unspecified: Secondary | ICD-10-CM

## 2013-09-13 DIAGNOSIS — R350 Frequency of micturition: Secondary | ICD-10-CM

## 2013-09-13 LAB — POCT UA - MICROSCOPIC ONLY
Casts, Ur, LPF, POC: NEGATIVE
Crystals, Ur, HPF, POC: NEGATIVE
Mucus, UA: NEGATIVE
Yeast, UA: NEGATIVE

## 2013-09-13 LAB — POCT URINALYSIS DIPSTICK
Bilirubin, UA: NEGATIVE
Glucose, UA: NEGATIVE
Ketones, UA: NEGATIVE
Nitrite, UA: NEGATIVE
Protein, UA: NEGATIVE
Spec Grav, UA: <=1.005
Urobilinogen, UA: NEGATIVE
pH, UA: 6

## 2013-09-13 MED ORDER — NITROFURANTOIN MONOHYD MACRO 100 MG PO CAPS: 100.0000 mg | ORAL_CAPSULE | Freq: Every day | ORAL | Status: DC

## 2013-09-13 MED ORDER — CIPROFLOXACIN HCL 500 MG PO TABS: 500.0000 mg | ORAL_TABLET | Freq: Two times a day (BID) | ORAL | Status: DC

## 2013-09-13 NOTE — Progress Notes (Signed)
   Subjective:    Patient ID: [name], female    DOB: [birth year], 80 y.o.   MRN: [id number]  HPI This 80 y.o. female presents for evaluation of frequent urination and dysuria. She states she gets UTI's often and was seen by Urology last year and she states She was told everything was fine  Review of Systems C/o dysuria No chest pain, SOB, HA, dizziness, vision change, N/V, diarrhea, constipation, myalgias, arthralgias or rash.     Objective:   Physical Exam Vital signs noted  Well developed well nourished 80 y/o female.  HEENT - Head atraumatic Normocephalic                Eyes - PERRLA, Conjuctiva - clear Sclera- Clear EOMI                Ears - EAC's Wnl TM's Wnl Gross Hearing WNL                Throat - oropharanx wnl Respiratory - Lungs CTA bilateral Cardiac - RRR S1 and S2 without murmur GI - Abdomen soft Nontender and bowel sounds active x 4   Results for orders placed in visit on 09/13/13  POCT URINALYSIS DIPSTICK      Result Value Ref Range   Color, UA orange     Clarity, UA clear     Glucose, UA neg     Bilirubin, UA neg     Ketones, UA neg     Spec Grav, UA <=1.005     Blood, UA mod     pH, UA 6.0     Protein, UA neg     Urobilinogen, UA negative     Nitrite, UA neg     Leukocytes, UA large (3+)    POCT UA - MICROSCOPIC ONLY      Result Value Ref Range   WBC, Ur, HPF, POC 40-80     RBC, urine, microscopic 5-8     Bacteria, U Microscopic few     Mucus, UA neg     Epithelial cells, urine per micros few     Crystals, Ur, HPF, POC neg     Casts, Ur, LPF, POC neg     Yeast, UA neg        Assessment & Plan:  Urinary frequency - Plan: POCT urinalysis dipstick, POCT UA - Microscopic Only, Urine culture, ciprofloxacin (CIPRO) 500 MG tablet, nitrofurantoin, macrocrystal-monohydrate, (MACROBID) 100 MG capsule  Painful urination - Plan: POCT urinalysis dipstick, POCT UA - Microscopic Only, Urine culture, ciprofloxacin (CIPRO) 500 MG tablet,  nitrofurantoin, macrocrystal-monohydrate, (MACROBID) 100 MG capsule  Recurrent UTI (urinary tract infection) - Plan: ciprofloxacin (CIPRO) 500 MG tablet, nitrofurantoin, macrocrystal-monohydrate, (MACROBID) 100 MG capsule  Lysbeth Penner FNP

## 2013-09-15 LAB — URINE CULTURE

## 2013-09-27 ENCOUNTER — Telehealth: Payer: Self-pay | Admitting: Physician Assistant

## 2013-09-27 NOTE — Telephone Encounter (Signed)
Apt made

## 2013-09-29 ENCOUNTER — Ambulatory Visit (INDEPENDENT_AMBULATORY_CARE_PROVIDER_SITE_OTHER): Payer: Medicare HMO | Admitting: Family Medicine

## 2013-09-29 VITALS — BP 105/55 | HR 77 | Temp 97.7°F | Ht 60.0 in | Wt 160.8 lb

## 2013-09-29 DIAGNOSIS — R609 Edema, unspecified: Secondary | ICD-10-CM

## 2013-09-29 MED ORDER — FUROSEMIDE 20 MG PO TABS: ORAL_TABLET | ORAL | Status: DC

## 2013-09-29 NOTE — Progress Notes (Signed)
   Subjective:    Patient ID: [name], female    DOB: 1925/12/24, 80 y.o.   MRN: [id number]  HPI This 80 y.o. female presents for evaluation of edema in lower extremities.   Review of Systems C/o lower extremity edema No chest pain, SOB, HA, dizziness, vision change, N/V, diarrhea, constipation, dysuria, urinary urgency or frequency, myalgias, arthralgias or rash.     Objective:   Physical Exam  Vital signs noted  Well developed well nourished female.  HEENT - Head atraumatic Normocephalic Respiratory - Lungs CTA bilateral Cardiac - RRR S1 and S2 without murmur GI - Abdomen soft Nontender and bowel sounds active x 4 Extremities - Edema in bilateral ankles      Assessment & Plan:  Edema - Plan: furosemide (LASIX) 20 MG tablet po tid x 3 days prn #30 Venous compression stockings with medium compression  Lysbeth Penner FNP

## 2013-10-24 ENCOUNTER — Encounter: Payer: Self-pay | Admitting: Family Medicine

## 2013-10-24 ENCOUNTER — Ambulatory Visit (INDEPENDENT_AMBULATORY_CARE_PROVIDER_SITE_OTHER): Payer: Medicare HMO | Admitting: Family Medicine

## 2013-10-24 VITALS — BP 115/72 | HR 89 | Temp 98.3°F | Ht 60.0 in | Wt 159.2 lb

## 2013-10-24 DIAGNOSIS — R51 Headache: Secondary | ICD-10-CM

## 2013-10-24 DIAGNOSIS — R319 Hematuria, unspecified: Secondary | ICD-10-CM

## 2013-10-24 DIAGNOSIS — R609 Edema, unspecified: Secondary | ICD-10-CM

## 2013-10-24 DIAGNOSIS — R300 Dysuria: Secondary | ICD-10-CM

## 2013-10-24 DIAGNOSIS — N390 Urinary tract infection, site not specified: Secondary | ICD-10-CM

## 2013-10-24 LAB — POCT URINALYSIS DIPSTICK

## 2013-10-24 LAB — POCT UA - MICROSCOPIC ONLY
Casts, Ur, LPF, POC: NEGATIVE
Crystals, Ur, HPF, POC: NEGATIVE
Mucus, UA: NEGATIVE
Yeast, UA: NEGATIVE

## 2013-10-24 MED ORDER — CIPROFLOXACIN HCL 500 MG PO TABS: 500.0000 mg | ORAL_TABLET | Freq: Two times a day (BID) | ORAL | Status: DC

## 2013-10-24 MED ORDER — NAPROXEN 500 MG PO TABS: 500.0000 mg | ORAL_TABLET | Freq: Two times a day (BID) | ORAL | Status: DC

## 2013-10-24 NOTE — Progress Notes (Signed)
   Subjective:    Patient ID: [name], female    DOB: 10/14/1925, 80 y.o.   MRN: [id number]  HPI This 80 y.o. female presents for evaluation of UTI sx's.  She gets uti's frequently.  She was put on macrobid one po qd for prophy and this didn't work and now she is having UTI sx's.  She is also having frequent headaches.   Review of Systems C/o headache and urinary sx's No chest pain, SOB, dizziness, vision change, N/V, diarrhea, constipation,  myalgias, arthralgias or rash.     Objective:   Physical Exam  Vital signs noted  Well developed well nourished female.  HEENT - Head atraumatic Normocephalic                Eyes - PERRLA, Conjuctiva - clear Sclera- Clear EOMI                Ears - EAC's Wnl TM's Wnl Gross Hearing WNL                Nose - Nares patent                 Throat - oropharanx wnl Respiratory - Lungs CTA bilateral Cardiac - RRR S1 and S2 without murmur GI - Abdomen soft Nontender and bowel sounds active x 4 Extremities - one plus pretibial and pedal edema bilateral. Neuro - Grossly intact.  Results for orders placed in visit on 10/24/13  POCT URINALYSIS DIPSTICK      Result Value Ref Range   Color, UA orange     Clarity, UA cloudy     Glucose, UA color interference     Bilirubin, UA color interference     Ketones, UA color interference     Blood, UA color interference     Protein, UA color interference    POCT UA - MICROSCOPIC ONLY      Result Value Ref Range   WBC, Ur, HPF, POC 20-25     RBC, urine, microscopic 5-10     Bacteria, U Microscopic mod     Mucus, UA neg     Epithelial cells, urine per micros occ     Crystals, Ur, HPF, POC neg     Casts, Ur, LPF, POC neg     Yeast, UA neg        Assessment & Plan:  Dysuria - Plan: POCT urinalysis dipstick, POCT UA - Microscopic Only, Ambulatory referral to Urology, ciprofloxacin (CIPRO) 500 MG tablet, Urine culture  Frequent UTI - Plan: Ambulatory referral to Urology, ciprofloxacin (CIPRO)  500 MG tablet, Urine culture  Urinary tract infection with hematuria, site unspecified - Plan: Ambulatory referral to Urology, ciprofloxacin (CIPRO) 500 MG tablet, Urine culture  Edema - Plan: naproxen (NAPROSYN) 500 MG tablet  Headache(784.0) - Plan: naproxen (NAPROSYN) 500 MG tablet po bid prn headache  Lysbeth Penner FNP

## 2013-11-25 ENCOUNTER — Ambulatory Visit: Payer: Medicare HMO | Admitting: Family Medicine

## 2013-12-16 ENCOUNTER — Other Ambulatory Visit: Payer: Self-pay

## 2013-12-16 DIAGNOSIS — E118 Type 2 diabetes mellitus with unspecified complications: Secondary | ICD-10-CM

## 2013-12-16 MED ORDER — INSULIN GLARGINE 100 UNIT/ML ~~LOC~~ SOLN: 30.0000 [IU] | Freq: Every evening | SUBCUTANEOUS | Status: DC

## 2013-12-16 MED ORDER — GLUCOSE BLOOD VI STRP: 1.0000 | ORAL_STRIP | Freq: Two times a day (BID) | Status: DC

## 2013-12-16 MED ORDER — GLUCOSE BLOOD VI STRP: ORAL_STRIP | Status: DC

## 2013-12-16 NOTE — Addendum Note (Signed)
Addended by: Ilean China on: 12/16/2013 11:09 AM   Modules accepted: Orders

## 2013-12-21 ENCOUNTER — Telehealth: Payer: Self-pay | Admitting: Family Medicine

## 2013-12-21 ENCOUNTER — Other Ambulatory Visit: Payer: Self-pay | Admitting: *Deleted

## 2013-12-21 MED ORDER — INSULIN GLARGINE 100 UNIT/ML SOLOSTAR PEN: 30.0000 [IU] | PEN_INJECTOR | Freq: Every day | SUBCUTANEOUS | Status: DC

## 2014-01-04 ENCOUNTER — Telehealth: Payer: Self-pay | Admitting: Family Medicine

## 2014-01-04 NOTE — Telephone Encounter (Signed)
appt given for Friday with [name]. Patient was offered appt at 6 today but wanted to see [facility]. Patient aware if she has any SOB or any other issues to please go to ER.

## 2014-01-06 ENCOUNTER — Ambulatory Visit (INDEPENDENT_AMBULATORY_CARE_PROVIDER_SITE_OTHER): Payer: Medicare HMO | Admitting: Family Medicine

## 2014-01-06 ENCOUNTER — Encounter: Payer: Self-pay | Admitting: Family Medicine

## 2014-01-06 VITALS — BP 114/62 | HR 83 | Temp 98.1°F | Ht 60.0 in | Wt 157.2 lb

## 2014-01-06 DIAGNOSIS — E119 Type 2 diabetes mellitus without complications: Secondary | ICD-10-CM

## 2014-01-06 DIAGNOSIS — I10 Essential (primary) hypertension: Secondary | ICD-10-CM

## 2014-01-06 LAB — POCT GLYCOSYLATED HEMOGLOBIN (HGB A1C): Hemoglobin A1C: 6.2

## 2014-01-06 NOTE — Patient Instructions (Signed)

## 2014-01-06 NOTE — Progress Notes (Signed)
   Subjective:    Patient ID: [name], female    DOB: 16-Jul-1925, 80 y.o.   MRN: [id number]  HPI  delightful 80 year old female who made the appointment initially because of some dependent edema which has since cleared. She does take Lasix 20 mg a day but says that she cannot tell that this makes her kidneys work anymore than normal. She is on Lantus insulin as well as metformin for her diabetes. She brings in a record of her monitoring and fasting blood sugars are all below 120. She does have chronic kidney disease and this might be one reason why she is not as responsive to Lasix. She denies any other complaints.    Review of Systems  Constitutional: Negative.   HENT: Negative.   Eyes: Negative.   Respiratory: Positive for shortness of breath.   Cardiovascular: Positive for leg swelling.  Musculoskeletal: Positive for back pain.  Neurological: Negative.   Hematological: Adenopathy: intermittent.  Psychiatric/Behavioral: Negative.        Objective:   Physical Exam  Constitutional: She is oriented to person, place, and time. She appears well-developed and well-nourished.  Eyes: Conjunctivae and EOM are normal.  Neck: Normal range of motion. Neck supple.  Cardiovascular: Normal rate, regular rhythm and normal heart sounds.   Pulmonary/Chest: Effort normal and breath sounds normal.  Abdominal: Soft. Bowel sounds are normal.  Musculoskeletal: Normal range of motion.  Neurological: She is alert and oriented to person, place, and time. She has normal reflexes.  Skin: Skin is warm and dry.  Psychiatric: She has a normal mood and affect. Her behavior is normal. Thought content normal.   BP 114/62  Pulse 83  Temp(Src) 98.1 F (36.7 C) (Oral)  Ht 5' (1.524 m)  Wt 157 lb 3.2 oz (71.305 kg)  BMI 30.70 kg/m2       Assessment & Plan:  1. HYPERTENSION BP at good level; will reduce dose amlodipine to see if this reduces edema  2. DM Meds as above; may reduce testing to  random since she appears to be doing well - POCT glycosylated hemoglobin (Hb A1C)  Wardell Honour MD

## 2014-01-09 ENCOUNTER — Telehealth: Payer: Self-pay | Admitting: *Deleted

## 2014-01-09 DIAGNOSIS — N189 Chronic kidney disease, unspecified: Secondary | ICD-10-CM

## 2014-01-09 NOTE — Telephone Encounter (Signed)
Has increased lasix to 40mg  daily and continues to have lower extremity edema.

## 2014-01-10 NOTE — Telephone Encounter (Signed)
Did she reduce amlodipine too?

## 2014-01-11 NOTE — Telephone Encounter (Signed)
She did reduce the amlodipine.

## 2014-01-12 NOTE — Telephone Encounter (Signed)
I suspect her chronic kidney disease limits the effectiveness of the Lasix.  I do not believe she needs referral to a nephrologist but that may be an option if she desires.  Elevate feet whenever she is sitting

## 2014-01-13 ENCOUNTER — Telehealth: Payer: Self-pay | Admitting: Family Medicine

## 2014-01-13 MED ORDER — METFORMIN HCL 500 MG PO TABS: 500.0000 mg | ORAL_TABLET | Freq: Every day | ORAL | Status: DC

## 2014-01-13 NOTE — Telephone Encounter (Signed)
Patient would like to proceed with nephrology appt. Referral placed.  She will f/u as needed.

## 2014-01-13 NOTE — Telephone Encounter (Signed)
done

## 2014-01-23 ENCOUNTER — Other Ambulatory Visit: Payer: Self-pay | Admitting: *Deleted

## 2014-01-23 MED ORDER — DILTIAZEM HCL 120 MG PO TABS: 120.0000 mg | ORAL_TABLET | Freq: Every day | ORAL | Status: DC

## 2014-01-23 NOTE — Telephone Encounter (Signed)
Last ov 9/15. Epic has Cardizem as one qd but refill request received from [facility] is for 1/2 daily. Last refill 12/19/13 for #30. Please review and order. Thanks.

## 2014-01-24 ENCOUNTER — Telehealth: Payer: Self-pay | Admitting: Family Medicine

## 2014-01-26 NOTE — Telephone Encounter (Signed)
Patient aware.

## 2014-01-26 NOTE — Telephone Encounter (Signed)
BP was okay without Benicar so she does not need it

## 2014-01-31 ENCOUNTER — Ambulatory Visit (INDEPENDENT_AMBULATORY_CARE_PROVIDER_SITE_OTHER): Payer: Medicare HMO

## 2014-01-31 DIAGNOSIS — Z23 Encounter for immunization: Secondary | ICD-10-CM

## 2014-02-07 ENCOUNTER — Ambulatory Visit: Payer: Medicare HMO | Admitting: Family Medicine

## 2014-02-09 ENCOUNTER — Ambulatory Visit: Payer: Medicare HMO | Admitting: Family Medicine

## 2014-02-09 ENCOUNTER — Telehealth: Payer: Self-pay | Admitting: Family Medicine

## 2014-02-14 MED ORDER — TRAMADOL HCL 50 MG PO TABS: 50.0000 mg | ORAL_TABLET | Freq: Two times a day (BID) | ORAL | Status: DC

## 2014-02-14 NOTE — Telephone Encounter (Signed)
We could try Tramadol, 50 mg BID to start  #30

## 2014-02-14 NOTE — Telephone Encounter (Signed)
Up front to pick up cant get through home phone left message on cell phone to pick up.

## 2014-03-06 ENCOUNTER — Other Ambulatory Visit: Payer: Self-pay | Admitting: Family Medicine

## 2014-03-15 ENCOUNTER — Telehealth: Payer: Self-pay | Admitting: Family Medicine

## 2014-03-15 NOTE — Telephone Encounter (Signed)
On 07-14-13 Dr. Jacelyn Grip had patient on Metformin 500 mg BID.  At the end of visit he documented Metformin 500 mg once daily.  Patient was given this information.   The A1c shows steady improvement and could be why medicine was decreased.

## 2014-04-01 ENCOUNTER — Other Ambulatory Visit: Payer: Self-pay | Admitting: Family Medicine

## 2014-04-12 ENCOUNTER — Telehealth: Payer: Self-pay | Admitting: Family Medicine

## 2014-04-12 NOTE — Telephone Encounter (Signed)
Appointment given for 8:30 in the am with [facility].

## 2014-04-13 ENCOUNTER — Ambulatory Visit (INDEPENDENT_AMBULATORY_CARE_PROVIDER_SITE_OTHER): Payer: Medicare HMO | Admitting: Family Medicine

## 2014-04-13 ENCOUNTER — Encounter: Payer: Self-pay | Admitting: Family Medicine

## 2014-04-13 VITALS — BP 153/72 | HR 72 | Temp 96.5°F | Ht 60.0 in | Wt 160.8 lb

## 2014-04-13 DIAGNOSIS — M542 Cervicalgia: Secondary | ICD-10-CM

## 2014-04-13 MED ORDER — GABAPENTIN 300 MG PO CAPS: 300.0000 mg | ORAL_CAPSULE | Freq: Three times a day (TID) | ORAL | Status: DC

## 2014-04-13 NOTE — Progress Notes (Signed)
   Subjective:    Patient ID: [name], female    DOB: Oct 03, 1925, 80 y.o.   MRN: [id number]  HPI 80 year old female with shoulder pain but she also has pain in her neck radiating to her hand she has some weakness in her right hand. The weakness is not related to one or 2 fingers but the whole hand such that she uses her left hand to hold a cup now.    Review of Systems  Constitutional: Negative.   HENT: Negative.   Eyes: Negative.   Respiratory: Negative.   Cardiovascular: Negative.   Gastrointestinal: Negative.   Endocrine: Negative.   Genitourinary: Negative.   Musculoskeletal: Positive for arthralgias and neck pain.  Hematological: Negative.   Psychiatric/Behavioral: Negative.        Objective:   Physical Exam  Musculoskeletal:  Right shoulder appears tenderness over the deltoid tendon and the area underneath the tip of the acromion consistent with bursitis/tendinitis.  Reflexes are depressed throughout the right arm compared to the left and there is decreased grip strength and finger strength on the right hand compared to the left. There is no point tenderness when her cervical spine is palpated  The deltoid area was injected with half cc of Marcaine and 40 mg of Depo-Medrol after usual prep with alcohol Betadine she tolerated this well    BP 153/72 mmHg  Pulse 72  Temp(Src) 96.5 F (35.8 C) (Oral)  Ht 5' (1.524 m)  Wt 160 lb 12.8 oz (72.938 kg)  BMI 31.40 kg/m2      Assessment & Plan:  1. Neck pain on right side Possible cervical radiculopathy will try Neurontin 300 mg at bedtime symptoms don't improve consider imaging study  Wardell Honour MD

## 2014-04-14 ENCOUNTER — Telehealth: Payer: Self-pay | Admitting: Family Medicine

## 2014-04-14 DIAGNOSIS — K219 Gastro-esophageal reflux disease without esophagitis: Secondary | ICD-10-CM

## 2014-04-14 MED ORDER — OMEPRAZOLE 20 MG PO CPDR: 20.0000 mg | DELAYED_RELEASE_CAPSULE | Freq: Two times a day (BID) | ORAL | Status: DC

## 2014-04-14 NOTE — Telephone Encounter (Signed)
Stp advised only to take gabapentin qhs not 3x a day. His visit notes state to take once a day at bedtime, when the rx went to the pharmacy the sig wasn't changed from its default, pt took one before bed last night and then about 6 hours later as she thought it might help and was confused. Advised pt this is probably the cause of the nausea and advised to only take gabapentin 1 time and at bedtime. Pt voiced understanding, will close encounter.

## 2014-04-14 NOTE — Telephone Encounter (Signed)
rx sent to [facility].pt aware.

## 2014-04-17 ENCOUNTER — Telehealth: Payer: Self-pay | Admitting: Family Medicine

## 2014-04-17 DIAGNOSIS — M542 Cervicalgia: Secondary | ICD-10-CM

## 2014-04-17 NOTE — Telephone Encounter (Signed)
Patient was seen on Thursday with her right arm and she states that it is still very painful. Patient aware that you are off til Wednesday but she wanted it to be addressed by you since you knew her situation. Patient states that she has a hard time opening the door. Please advise.

## 2014-04-18 NOTE — Telephone Encounter (Signed)
Do you want MRI of shoulder or cervical?

## 2014-04-18 NOTE — Telephone Encounter (Signed)
My note states plan B woulf be MRI if no relief from shot but we could also refer for another opinion to neurology

## 2014-04-19 ENCOUNTER — Telehealth: Payer: Self-pay | Admitting: Family Medicine

## 2014-04-19 NOTE — Telephone Encounter (Signed)
Patient was curious about the cost of the MRI i advised patient to call her insurance company to see what they usually pay.

## 2014-04-20 ENCOUNTER — Encounter: Payer: Self-pay | Admitting: Neurology

## 2014-04-20 ENCOUNTER — Ambulatory Visit (INDEPENDENT_AMBULATORY_CARE_PROVIDER_SITE_OTHER): Payer: Commercial Managed Care - HMO | Admitting: Neurology

## 2014-04-20 VITALS — BP 130/70 | HR 79 | Ht 60.0 in | Wt 162.1 lb

## 2014-04-20 DIAGNOSIS — M542 Cervicalgia: Secondary | ICD-10-CM

## 2014-04-20 DIAGNOSIS — M25511 Pain in right shoulder: Secondary | ICD-10-CM

## 2014-04-20 DIAGNOSIS — M79601 Pain in right arm: Secondary | ICD-10-CM

## 2014-04-20 NOTE — Progress Notes (Signed)
NEUROLOGY CONSULTATION NOTE  [name] MRN: [id number] DOB: July 01, 1925  Referring provider: Dr. Sabra Heck Primary care provider: Dr. Sabra Heck  Reason for consult:  Right neck and arm pain  HISTORY OF PRESENT ILLNESS: [name] is an 80 year old right-handed woman with pulmonary fibrosis, type 2 diabetes, arthritis, fibromyalgia, hyperlipidemia, hypertension, and history of stroke, DVT and breast cancer who presents for right sided neck pain.  Records reviewed.  She described severe aching pain in the right shoulder which radiated down to include the upper arm, forearm and hand.  Later, she did note right sided neck pain, but feels the pain was really coming from the shoulder.  There was no associated numbness or tingling.  She felt that her right hand was weak and had trouble turning a doorknob.  She saw her PCP on 04/13/14 where she was given a shot in the shoulder.  It seemed improved for a little while but then gradually returned.  She was started on gabapentin 300mg  at bedtime, but she had severe side effects.  Briefly, she noted pain in the left shoulder, but not as severe.  Over the past few days, it has gradually improved and she is without the pain today, although she still has tenderness in the shoulder.    She reports history of shoulder and upper arm pain, but nothing like this.    PAST MEDICAL HISTORY: Past Medical History  Diagnosis Date  . Diabetes mellitus without complication   . Hyperlipidemia   . Hypertension     PAST SURGICAL HISTORY: Past Surgical History  Procedure Laterality Date  . Appendectomy    . Cholecystectomy    . Tonsillectomy    . Joint replacement      bilat knee     MEDICATIONS: Current Outpatient Prescriptions on File Prior to Visit  Medication Sig Dispense Refill  . amLODipine (NORVASC) 5 MG tablet Take 0.5 tablets (2.5 mg total) by mouth daily. 90 tablet 1  . Aspirin-Caffeine (BAYER BACK & BODY PAIN EX ST PO) Take by mouth.    .  B-D ULTRAFINE III SHORT PEN 31G X 8 MM MISC USE AS DIRECTED 100 each 2  . Calcium Carbonate-Vit D-Min (CALCIUM 1200 PO) Take 1 tablet by mouth every other day.     . ciprofloxacin (CIPRO) 250 MG tablet Take 250 mg by mouth at bedtime.    Marland Kitchen diltiazem (CARDIZEM) 120 MG tablet TAKE ONE TABLET BY MOUTH ONCE DAILY 60 tablet 0  . gabapentin (NEURONTIN) 300 MG capsule Take 1 capsule (300 mg total) by mouth 3 (three) times daily. 30 capsule 0  . glucose blood (ONE TOUCH ULTRA TEST) test strip 1 each by Other route 2 (two) times daily. 100 each 3  . Lancets (ONETOUCH ULTRASOFT) lancets Use as instructed once daily 100 each 3  . LANTUS SOLOSTAR 100 UNIT/ML Solostar Pen INJECT 30 UNITS SUBCUTANEOUSLY ONCE DAILY AT  10  PM (Patient taking differently: INJECT 22 UNITS SUBCUTANEOUSLY ONCE DAILY AT  10  PM) 15 pen 2  . MAGNESIUM-ZINC PO Take 15 mg by mouth 3 (three) times daily.    . metFORMIN (GLUCOPHAGE) 500 MG tablet Take 1 tablet (500 mg total) by mouth daily with breakfast. 90 tablet 0  . omeprazole (PRILOSEC) 20 MG capsule Take 1 capsule (20 mg total) by mouth 2 (two) times daily before a meal. 180 capsule 0  . pravastatin (PRAVACHOL) 40 MG tablet Take 1 tablet (40 mg total) by mouth daily. 90 tablet 1  No current facility-administered medications on file prior to visit.    ALLERGIES: Allergies  Allergen Reactions  . Ace Inhibitors   . Sulfa Antibiotics Nausea And Vomiting    FAMILY HISTORY: Family History  Problem Relation Age of Onset  . Cancer Mother   . Cancer Father   . Cancer Brother     SOCIAL HISTORY: History   Social History  . Marital Status: Married    Spouse Name: N/A    Number of Children: N/A  . Years of Education: N/A   Occupational History  . Not on file.   Social History Main Topics  . Smoking status: Never Smoker   . Smokeless tobacco: Not on file  . Alcohol Use: No  . Drug Use: No  . Sexual Activity: Not on file   Other Topics Concern  . Not on file    Social History Narrative   Lives alone in a one story home.  Husband passed away in 07/01/2022.  Has 2 sons.  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  Education: specialty courses with [employer], high school education.    REVIEW OF SYSTEMS: Constitutional: No fevers, chills, or sweats, no generalized fatigue, change in appetite Eyes: No visual changes, double vision, eye pain Ear, nose and throat: No hearing loss, ear pain, nasal congestion, sore throat Cardiovascular: No chest pain, palpitations Respiratory:  No shortness of breath at rest or with exertion, wheezes GastrointestinaI: No nausea, vomiting, diarrhea, abdominal pain, fecal incontinence Genitourinary:  No dysuria, urinary retention or frequency Musculoskeletal:  Right shoulder pain Integumentary: No rash, pruritus, skin lesions Neurological: as above Psychiatric: No depression, insomnia, anxiety Endocrine: No palpitations, fatigue, diaphoresis, mood swings, change in appetite, change in weight, increased thirst Hematologic/Lymphatic:  No anemia, purpura, petechiae. Allergic/Immunologic: no itchy/runny eyes, nasal congestion, recent allergic reactions, rashes  PHYSICAL EXAM: Filed Vitals:   04/20/14 1049  BP: 130/70  Pulse: 79   General: No acute distress Head:  Normocephalic/atraumatic Eyes:  fundi unremarkable, without vessel changes, exudates, hemorrhages or papilledema. Neck: supple, no paraspinal tenderness, full range of motion Back: No paraspinal tenderness Heart: regular rate and rhythm Lungs: Clear to auscultation bilaterally. Vascular: No carotid bruits. Neurological Exam: Mental status: alert and oriented to person, place, and time, recent and remote memory intact, fund of knowledge intact, attention and concentration intact, speech fluent and not dysarthric, language intact. Cranial nerves: CN I: not tested CN II: pupils equal, round and reactive to light, visual fields intact, fundi unremarkable, without  vessel changes, exudates, hemorrhages or papilledema. CN III, IV, VI:  full range of motion, no nystagmus, no ptosis CN V: facial sensation intact CN VII: upper and lower face symmetric CN VIII: hearing intact CN IX, X: gag intact, uvula midline CN XI: sternocleidomastoid and trapezius muscles intact CN XII: tongue midline Bulk & Tone: normal, no fasciculations. Motor:  Reduced range of motion of both shoulders.  At least 4+/5 in shoulder abduction.  5-/5 in triceps bilaterally.  Wrist, fingers and grip normal.  5/5 otherwise. Sensation:  Reduced vibration in toes.  Pinprick sensation intact. Deep Tendon Reflexes:  Areflexive, toes downgoing Finger to nose testing:  No dysmetria but difficult to fully extend arms. Gait:  Ambulates with cane, has limp. Romberg negative.  IMPRESSION: Right sided shoulder an arm pain extending to the hand.  Some neck pain briefly.  Given the normal hand strength today, I suspect that the subjective hand weakness was likely limited due to pain rather than actual weakness.  The shoulder  may be playing a role, since it is tender and she had relief following the injection.  She has no numbness or tingling to suggest radiculopathy but possible given prior neck pain and pain extending into the arm.  PLAN: She is doing well at this time.  I would hold off on any testing.  If pain returns she may call us.  Options would be PT or possibly restarting gabapentin at a lower dose (100mg ).  45 minutes spent with patient, over 50% spent discussing etiology and coordinating care.  Thank you for allowing me to take part in the care of this patient.  Metta Clines, DO  CC:  Alain Honey, MD

## 2014-04-20 NOTE — Patient Instructions (Signed)
Since you are getting better, I wouldn't do anything right now.  If the pain returns, we can first consider physical therapy or we can restart gabapentin but at a much lower dose (100mg ).  Call with questions or concerns.  Happy holidays!

## 2014-04-26 ENCOUNTER — Ambulatory Visit (HOSPITAL_COMMUNITY): Payer: Commercial Managed Care - HMO | Attending: Family Medicine

## 2014-05-09 ENCOUNTER — Encounter: Payer: Self-pay | Admitting: Family Medicine

## 2014-05-09 ENCOUNTER — Ambulatory Visit (INDEPENDENT_AMBULATORY_CARE_PROVIDER_SITE_OTHER): Payer: PPO | Admitting: Family Medicine

## 2014-05-09 VITALS — BP 142/71 | HR 80 | Temp 96.6°F | Ht 60.0 in | Wt 160.2 lb

## 2014-05-09 DIAGNOSIS — E119 Type 2 diabetes mellitus without complications: Secondary | ICD-10-CM

## 2014-05-09 DIAGNOSIS — M79621 Pain in right upper arm: Secondary | ICD-10-CM

## 2014-05-09 DIAGNOSIS — I10 Essential (primary) hypertension: Secondary | ICD-10-CM

## 2014-05-09 DIAGNOSIS — E785 Hyperlipidemia, unspecified: Secondary | ICD-10-CM

## 2014-05-09 DIAGNOSIS — M25521 Pain in right elbow: Secondary | ICD-10-CM

## 2014-05-09 DIAGNOSIS — K219 Gastro-esophageal reflux disease without esophagitis: Secondary | ICD-10-CM | POA: Diagnosis not present

## 2014-05-09 MED ORDER — GABAPENTIN 100 MG PO CAPS: 100.0000 mg | ORAL_CAPSULE | Freq: Every day | ORAL | Status: DC

## 2014-05-09 NOTE — Progress Notes (Signed)
   Subjective:    Patient ID: [name], female    DOB: 07-14-1925, 81 y.o.   MRN: [id number]  HPI 81 year old female here to follow-up pain and weakness in her right arm. Since her last visit she saw a neurologist who felt the weakness was related to the pain in spite of what I thought was definite reflex changes and muscle weakness compared to the left arm. I was thinking she probably had cervical disc disease and wanted a second opinion about that but that was not really entertained.  Today she says her pain is improved after the local injection idea in the deltoid area but she still is unable to use the arm like she was a baseline. Also, she was unable to take gabapentin 300 mg.    Review of Systems  Musculoskeletal: Positive for neck stiffness.  Neurological: Positive for weakness.       Objective:   Physical Exam  Neurological:  Exam confined to right upper extremity: Muscle strength compared to left arm is diminished. Deep tendon reflexes are also diminished compared to the left arm. There is no atrophy noted.   BP 142/71 mmHg  Pulse 80  Temp(Src) 96.6 F (35.9 C) (Oral)  Ht 5' (1.524 m)  Wt 160 lb 3.2 oz (72.666 kg)  BMI 31.29 kg/m2       Assessment & Plan:  1. Essential hypertension   2. Type 2 diabetes mellitus without complication   3. HLD (hyperlipidemia)   4. Gastroesophageal reflux disease without esophagitis   5. Pain in joint, upper arm, right I'm still concerned about possibility of cervical disc disease. We may need an MRI to really define the problem but I would like to proceed with trial of physical therapy and gabapentin at a lower dose of 100 mg at bedtime first  Wardell Honour MD - Ambulatory referral to Physical Therapy

## 2014-05-10 ENCOUNTER — Other Ambulatory Visit: Payer: Self-pay

## 2014-05-10 DIAGNOSIS — M25521 Pain in right elbow: Secondary | ICD-10-CM

## 2014-05-16 ENCOUNTER — Ambulatory Visit: Payer: PPO | Attending: Family Medicine | Admitting: Physical Therapy

## 2014-05-16 DIAGNOSIS — M25619 Stiffness of unspecified shoulder, not elsewhere classified: Secondary | ICD-10-CM | POA: Diagnosis not present

## 2014-05-16 DIAGNOSIS — M25511 Pain in right shoulder: Secondary | ICD-10-CM | POA: Insufficient documentation

## 2014-05-18 ENCOUNTER — Ambulatory Visit: Payer: PPO | Admitting: Physical Therapy

## 2014-05-18 DIAGNOSIS — M25511 Pain in right shoulder: Secondary | ICD-10-CM | POA: Diagnosis not present

## 2014-05-23 ENCOUNTER — Telehealth: Payer: Self-pay

## 2014-05-23 ENCOUNTER — Ambulatory Visit: Payer: PPO | Admitting: *Deleted

## 2014-05-23 DIAGNOSIS — M25511 Pain in right shoulder: Secondary | ICD-10-CM | POA: Diagnosis not present

## 2014-05-23 MED ORDER — GABAPENTIN 100 MG PO CAPS: 100.0000 mg | ORAL_CAPSULE | Freq: Every day | ORAL | Status: DC

## 2014-05-23 NOTE — Telephone Encounter (Signed)
This is okay to refill the gabapentin for 3 months or until the patient sees Dr. Sabra Heck again

## 2014-05-23 NOTE — Telephone Encounter (Signed)
Dr Sabra Heck put me on Gabapentin 100 mg  I only have one left for today.  I'm I suppose to continue?  If so I need some more called in

## 2014-05-25 ENCOUNTER — Encounter: Payer: PPO | Admitting: *Deleted

## 2014-05-29 ENCOUNTER — Encounter: Payer: PPO | Admitting: Physical Therapy

## 2014-06-01 ENCOUNTER — Other Ambulatory Visit: Payer: Self-pay | Admitting: Family Medicine

## 2014-06-01 ENCOUNTER — Encounter: Payer: PPO | Admitting: Physical Therapy

## 2014-06-01 ENCOUNTER — Ambulatory Visit: Payer: PPO | Admitting: Physical Therapy

## 2014-06-01 DIAGNOSIS — M25511 Pain in right shoulder: Secondary | ICD-10-CM | POA: Diagnosis not present

## 2014-06-06 ENCOUNTER — Encounter: Payer: PPO | Admitting: *Deleted

## 2014-06-08 ENCOUNTER — Ambulatory Visit: Payer: PPO | Attending: Family Medicine | Admitting: *Deleted

## 2014-06-08 DIAGNOSIS — M25619 Stiffness of unspecified shoulder, not elsewhere classified: Secondary | ICD-10-CM | POA: Diagnosis not present

## 2014-06-08 DIAGNOSIS — M25511 Pain in right shoulder: Secondary | ICD-10-CM | POA: Diagnosis not present

## 2014-06-09 ENCOUNTER — Other Ambulatory Visit: Payer: Self-pay | Admitting: Family Medicine

## 2014-06-09 ENCOUNTER — Telehealth: Payer: Self-pay | Admitting: Family Medicine

## 2014-06-09 DIAGNOSIS — E785 Hyperlipidemia, unspecified: Secondary | ICD-10-CM

## 2014-06-09 DIAGNOSIS — M25519 Pain in unspecified shoulder: Secondary | ICD-10-CM

## 2014-06-09 DIAGNOSIS — M549 Dorsalgia, unspecified: Secondary | ICD-10-CM

## 2014-06-09 MED ORDER — PRAVASTATIN SODIUM 40 MG PO TABS: 40.0000 mg | ORAL_TABLET | Freq: Every day | ORAL | Status: DC

## 2014-06-09 NOTE — Telephone Encounter (Signed)
done

## 2014-06-09 NOTE — Telephone Encounter (Signed)
Patient wants to see about getting MRI. Chris at physical therapy states he feels like she may have bone spurs in her shoulders and feels like the MRI will show this.  Patient is aware that you are out of the office until Tuesday.

## 2014-06-12 NOTE — Telephone Encounter (Signed)
I think the original order was for a cervical MRI to R/O a disc.  Not sure that shoulder MRI would affect treatment

## 2014-06-13 NOTE — Telephone Encounter (Signed)
Spoke with patient and she is aware that MRI is being ordered.

## 2014-06-14 ENCOUNTER — Other Ambulatory Visit: Payer: Self-pay | Admitting: Nurse Practitioner

## 2014-06-20 ENCOUNTER — Encounter: Payer: Commercial Managed Care - HMO | Admitting: *Deleted

## 2014-06-22 ENCOUNTER — Ambulatory Visit: Payer: PPO | Admitting: Physical Therapy

## 2014-06-22 ENCOUNTER — Encounter: Payer: Self-pay | Admitting: Physical Therapy

## 2014-06-22 ENCOUNTER — Telehealth: Payer: Self-pay | Admitting: Family Medicine

## 2014-06-22 DIAGNOSIS — M25511 Pain in right shoulder: Secondary | ICD-10-CM

## 2014-06-22 DIAGNOSIS — M25611 Stiffness of right shoulder, not elsewhere classified: Secondary | ICD-10-CM

## 2014-06-22 NOTE — Telephone Encounter (Signed)
I called her earlier today and left a message that we would be happy to try either Xanax or Valium depending on her experience with either and I have not heard that she has called back

## 2014-06-22 NOTE — Telephone Encounter (Signed)
Please advise and route to Pool A 

## 2014-06-22 NOTE — Therapy (Signed)
[facility] [place], [place], [address] Phone: [phone]   Fax:  [phone]  Physical Therapy Treatment  Patient Details  Name: [name] MRN: [id number] Date of Birth: 1926-04-01 Referring Provider:  Chipper Herb, MD  Encounter Date: 06/22/2014      PT End of Session - 06/22/14 1255    Visit Number 6   Number of Visits 12   PT Start Time 3532   PT Stop Time 1308   PT Time Calculation (min) 34 min      Past Medical History  Diagnosis Date  . Diabetes mellitus without complication   . Hyperlipidemia   . Hypertension     Past Surgical History  Procedure Laterality Date  . Appendectomy    . Cholecystectomy    . Tonsillectomy    . Joint replacement      bilat knee     There were no vitals taken for this visit.  Visit Diagnosis:  Right shoulder pain  Shoulder stiffness, right      Subjective Assessment - 06/22/14 1236    Symptoms Very painful in shoulder   Currently in Pain? Yes   Pain Score 5    Pain Location Shoulder   Pain Orientation Right   Pain Descriptors / Indicators Nagging          [facility] PT Assessment - 06/22/14 0001    PROM   Overall PROM  Deficits   Right Shoulder Flexion 125 Degrees   Right Shoulder External Rotation 35 Degrees                  [facility] Adult PT Treatment/Exercise - 06/22/14 0001    Modalities   Modalities Moist Heat;Electrical Stimulation   Manual Therapy   Manual Therapy Passive ROM   Passive ROM gentle range low holds                     PT Long Term Goals - 06/22/14 1314    PT LONG TERM GOAL #1   Title demonstrate and/or verbalize techniques to reduce the risk of re-injury to include info on: posture   Time 6   Period Weeks   Status Achieved  06/08/14   PT LONG TERM GOAL #2   Title be independent with advanced HEP   Time 6   Status On-going   PT LONG TERM GOAL #3   Title increase ROM active right shoulder flexion to  125 degrees   Time 6   Period Weeks   Status On-going   PT LONG TERM GOAL #4   Title increase ROM with ER 55 degrees   Time 6   Period Weeks   Status On-going   PT LONG TERM GOAL #5   Title increase right shoulder strength to 4/5 so able to perform tasks that require antigravity movements.   Time 6   Period Weeks   Status On-going   Additional Long Term Goals   Additional Long Term Goals Yes   PT LONG TERM GOAL #6   Title perform ADL's with right shoulder pain not 4/10   Time 6   Period Weeks   Status On-going               Plan - 06/22/14 1310    Clinical Impression Statement Pt unable to tolerate very much ROM due to pain level, pt understands importance of self ROM to prevent tightness in shoulder   PT Treatment/Interventions ADLs/Self Care Home Management;Moist  Heat;Patient/family education;Therapeutic exercise;Passive range of motion;Ultrasound;Manual techniques;Cryotherapy;Electrical Stimulation   PT Next Visit Plan Pt on hold until MRI per request        Problem List Patient Active Problem List   Diagnosis Date Noted  . Grieving 07/14/2013  . HLD (hyperlipidemia) 02/11/2013  . Need for prophylactic vaccination and inoculation against influenza 02/11/2013  . Fibromyalgia 02/11/2013  . Frequency of urination 11/09/2012  . Arthritis 11/09/2012  . UTI (urinary tract infection) 08/02/2012  . Frequency 08/02/2012  . DM (diabetes mellitus) 08/02/2012  . HTN (hypertension) 08/02/2012  . DM 08/01/2008  . CVA 08/01/2008  . DVT 08/01/2008  . OSTEOARTHRITIS 04/09/2007  . HYPERLIPIDEMIA 03/08/2007  . HYPERTENSION 03/08/2007  . PULMONARY FIBROSIS, POSTINFLAMMATORY 03/08/2007  . GERD 03/08/2007  . BREAST CANCER, HX OF 03/08/2007    Sereniti Wan P, PTA 06/22/2014, 1:26 PM  [facility] [address] [place], [place], [address] Phone: [phone]   Fax:  [phone]

## 2014-06-23 MED ORDER — DIAZEPAM 5 MG PO TABS: ORAL_TABLET | ORAL | Status: DC

## 2014-06-23 NOTE — Telephone Encounter (Signed)
Called Valium into [facility]. Patient aware.

## 2014-06-23 NOTE — Telephone Encounter (Signed)
Patient is ok with either Xanax or Valium. She hasn't had any experience with either but would appreciate which ever one you want to prescribe.

## 2014-06-26 ENCOUNTER — Ambulatory Visit (HOSPITAL_COMMUNITY)
Admission: RE | Admit: 2014-06-26 | Discharge: 2014-06-26 | Disposition: A | Discharge status: Home / Self Care | Payer: PPO | Source: Ambulatory Visit | Attending: Family Medicine | Admitting: Family Medicine

## 2014-06-26 DIAGNOSIS — M47812 Spondylosis without myelopathy or radiculopathy, cervical region: Secondary | ICD-10-CM | POA: Diagnosis not present

## 2014-06-26 DIAGNOSIS — M542 Cervicalgia: Secondary | ICD-10-CM | POA: Insufficient documentation

## 2014-06-26 DIAGNOSIS — M25519 Pain in unspecified shoulder: Secondary | ICD-10-CM

## 2014-06-26 DIAGNOSIS — M549 Dorsalgia, unspecified: Secondary | ICD-10-CM

## 2014-06-26 DIAGNOSIS — R200 Anesthesia of skin: Secondary | ICD-10-CM | POA: Insufficient documentation

## 2014-06-27 ENCOUNTER — Telehealth: Payer: Self-pay

## 2014-06-27 MED ORDER — TRAMADOL HCL 50 MG PO TABS: ORAL_TABLET | ORAL | Status: DC

## 2014-06-27 NOTE — Telephone Encounter (Signed)
Script up front, pt has some at home  She will take what she has first.

## 2014-06-27 NOTE — Telephone Encounter (Signed)
ultram 50, one half tablet every 4-6 hours #6 tablets no refill

## 2014-06-29 ENCOUNTER — Telehealth: Payer: Self-pay | Admitting: Family Medicine

## 2014-06-30 ENCOUNTER — Other Ambulatory Visit: Payer: Self-pay | Admitting: Family Medicine

## 2014-06-30 MED ORDER — TRAMADOL HCL 50 MG PO TABS: ORAL_TABLET | ORAL | Status: DC

## 2014-06-30 MED ORDER — GABAPENTIN 100 MG PO CAPS: ORAL_CAPSULE | ORAL | Status: DC

## 2014-06-30 NOTE — Telephone Encounter (Signed)
Please review and advise.

## 2014-06-30 NOTE — Telephone Encounter (Signed)
Please bring a copy of this report for me to review so that we can take care of this patient better

## 2014-06-30 NOTE — Telephone Encounter (Signed)
Please bring a copy of report and discuss with me any further action is necessary and any treatment we can give her for pain until we can move further with action deemed necessary.

## 2014-06-30 NOTE — Addendum Note (Signed)
Addended by: Ilean China on: 06/30/2014 01:38 PM   Modules accepted: Orders

## 2014-06-30 NOTE — Telephone Encounter (Signed)
We refilled her meds for pain for #6, until Dr Sabra Heck could fill med and discuss MRI with her  Will forward to [facility] and nurse.

## 2014-06-30 NOTE — Telephone Encounter (Signed)
Dr Sabra Heck spoke with patient about results and medication.

## 2014-07-03 ENCOUNTER — Telehealth: Payer: Self-pay | Admitting: Family Medicine

## 2014-07-03 NOTE — Telephone Encounter (Signed)
Patient states that she has not noticed any difference since starting gabapentin and Tramadol. She is still having a lot of pain. Patient is wanting to see Dr.Miller sooner than 4/14 due to more questions about MRI

## 2014-07-04 NOTE — Telephone Encounter (Signed)
Appt scheduled for 3/8 to discuss MRI and pain management options.

## 2014-07-10 ENCOUNTER — Other Ambulatory Visit: Payer: Self-pay | Admitting: *Deleted

## 2014-07-10 MED ORDER — TRAMADOL HCL 50 MG PO TABS: ORAL_TABLET | ORAL | Status: DC

## 2014-07-10 NOTE — Telephone Encounter (Signed)
Please review directions and quantity for Tramadol refill. Route to Pool A and sign if approved.

## 2014-07-11 ENCOUNTER — Ambulatory Visit (INDEPENDENT_AMBULATORY_CARE_PROVIDER_SITE_OTHER): Payer: PPO | Admitting: Family Medicine

## 2014-07-11 ENCOUNTER — Encounter: Payer: Self-pay | Admitting: Family Medicine

## 2014-07-11 VITALS — BP 111/61 | HR 88 | Temp 97.2°F | Ht 59.0 in | Wt 160.0 lb

## 2014-07-11 DIAGNOSIS — E118 Type 2 diabetes mellitus with unspecified complications: Secondary | ICD-10-CM

## 2014-07-11 DIAGNOSIS — Z23 Encounter for immunization: Secondary | ICD-10-CM | POA: Diagnosis not present

## 2014-07-11 DIAGNOSIS — M542 Cervicalgia: Secondary | ICD-10-CM | POA: Diagnosis not present

## 2014-07-11 DIAGNOSIS — N390 Urinary tract infection, site not specified: Secondary | ICD-10-CM | POA: Insufficient documentation

## 2014-07-11 DIAGNOSIS — I10 Essential (primary) hypertension: Secondary | ICD-10-CM | POA: Diagnosis not present

## 2014-07-11 DIAGNOSIS — E785 Hyperlipidemia, unspecified: Secondary | ICD-10-CM

## 2014-07-11 MED ORDER — GABAPENTIN 100 MG PO CAPS: ORAL_CAPSULE | ORAL | Status: DC

## 2014-07-11 NOTE — Progress Notes (Signed)
Subjective:    Patient ID: [name], female    DOB: November 16, 1925, 81 y.o.   MRN: [id number]  HPI 81 year old female here to follow-up arm and back pain. Since her last visit she had a cervical MRI which shows disc disease from C3-C7. I think this is causing her symptoms. I had hoped to control symptoms with Neurontin but she is only taking 100 mg at bedtime and I don't think that dose is high enough to have much effect. She also uses tramadol isn't effective as well.  We also spent some time talking about her diabetes areas she takes Lantus at night and metformin twice a day in general her sugars have been good but there are a few outliers which seemed to concern her greatly. I tried to reassure her that for the most part her diabetes is controlled and indeed her last A1c was well below goal.    Review of Systems  Constitutional: Negative.   HENT: Negative.   Respiratory: Negative.   Cardiovascular: Positive for leg swelling.  Gastrointestinal: Negative.   Musculoskeletal: Positive for back pain and arthralgias.  Psychiatric/Behavioral: Negative.    Patient Active Problem List   Diagnosis Date Noted  . Neck pain   . Frequent UTI   . Grieving 07/14/2013  . HLD (hyperlipidemia) 02/11/2013  . Need for prophylactic vaccination and inoculation against influenza 02/11/2013  . Fibromyalgia 02/11/2013  . Frequency of urination 11/09/2012  . Arthritis 11/09/2012  . UTI (urinary tract infection) 08/02/2012  . Frequency 08/02/2012  . DM (diabetes mellitus) 08/02/2012  . HTN (hypertension) 08/02/2012  . Diabetes 08/01/2008  . CVA 08/01/2008  . DVT 08/01/2008  . OSTEOARTHRITIS 04/09/2007  . HYPERLIPIDEMIA 03/08/2007  . Essential hypertension 03/08/2007  . PULMONARY FIBROSIS, POSTINFLAMMATORY 03/08/2007  . GERD 03/08/2007  . BREAST CANCER, HX OF 03/08/2007   Outpatient Encounter Prescriptions as of 07/11/2014  Medication Sig  . amLODipine (NORVASC) 5 MG tablet Take 0.5 tablets  (2.5 mg total) by mouth daily.  . Aspirin-Caffeine (BAYER BACK & BODY PAIN EX ST PO) Take by mouth.  . B-D ULTRAFINE III SHORT PEN 31G X 8 MM MISC USE AS DIRECTED  . Calcium Carbonate-Vit D-Min (CALCIUM 1200 PO) Take 1 tablet by mouth every other day.   . ciprofloxacin (CIPRO) 250 MG tablet Take 250 mg by mouth at bedtime.  Marland Kitchen diltiazem (CARDIZEM) 120 MG tablet TAKE ONE TABLET BY MOUTH ONCE DAILY  . gabapentin (NEURONTIN) 100 MG capsule Take 2 tabs at bedtime x 3 days if pain is unrelieved take 3 tabs at bedtime for 3 days. If unrelieved take additional tab in AM.  . glucose blood (ONE TOUCH ULTRA TEST) test strip 1 each by Other route 2 (two) times daily.  . Lancets (ONETOUCH ULTRASOFT) lancets Use as instructed once daily  . LANTUS SOLOSTAR 100 UNIT/ML Solostar Pen INJECT 30 UNITS SUBCUTANEOUSLY ONCE DAILY AT  10  PM (Patient taking differently: INJECT 22 UNITS SUBCUTANEOUSLY ONCE DAILY AT  10  PM)  . MAGNESIUM-ZINC PO Take 15 mg by mouth 3 (three) times daily.  . metFORMIN (GLUCOPHAGE) 500 MG tablet Take 1 tablet (500 mg total) by mouth daily with breakfast.  . omeprazole (PRILOSEC) 20 MG capsule TAKE ONE CAPSULE BY MOUTH TWICE DAILY BEFORE MEAL(S)  . pravastatin (PRAVACHOL) 40 MG tablet Take 1 tablet (40 mg total) by mouth daily.  . traMADol (ULTRAM) 50 MG tablet Take one tablet every 4-6 hours PRN  . [DISCONTINUED] gabapentin (NEURONTIN) 100 MG capsule Take  2 tab HS; may increase to take another dose in AM depending on response to med  . diazepam (VALIUM) 5 MG tablet Take 1 tablet 1 hour before MRI and 1 daily as needed (Patient not taking: Reported on 07/11/2014)      Objective:   Physical Exam  Constitutional: She is oriented to person, place, and time. She appears well-developed and well-nourished.  HENT:  Head: Normocephalic and atraumatic.  Cardiovascular: Normal rate.   Pulmonary/Chest: Effort normal.  Neurological: She is alert and oriented to person, place, and time. She has  normal reflexes.          Assessment & Plan:  1. Neck pain I believe her back neck and arm pain related to cervical disc disease. I would like to titrate Neurontin to effective level. We had tried physical therapy before but I'm not sure that they worked on her neck but more her shoulder.  2. Essential hypertension   3. HLD (hyperlipidemia)   4. Type 2 diabetes mellitus with complication - Ambulatory referral to Ophthalmology  Wardell Honour MD

## 2014-07-11 NOTE — Telephone Encounter (Signed)
Patient aware rx ready to be picked up 

## 2014-07-11 NOTE — Patient Instructions (Signed)

## 2014-07-12 ENCOUNTER — Encounter (HOSPITAL_COMMUNITY): Payer: Self-pay | Admitting: *Deleted

## 2014-07-12 ENCOUNTER — Telehealth: Payer: Self-pay | Admitting: Family Medicine

## 2014-07-12 ENCOUNTER — Emergency Department (HOSPITAL_COMMUNITY)
Admission: EM | Admit: 2014-07-12 | Discharge: 2014-07-12 | Disposition: A | Discharge status: Home / Self Care | Payer: PPO | Attending: Emergency Medicine | Admitting: Emergency Medicine

## 2014-07-12 ENCOUNTER — Emergency Department (HOSPITAL_COMMUNITY): Payer: PPO

## 2014-07-12 DIAGNOSIS — E785 Hyperlipidemia, unspecified: Secondary | ICD-10-CM | POA: Insufficient documentation

## 2014-07-12 DIAGNOSIS — Z79899 Other long term (current) drug therapy: Secondary | ICD-10-CM | POA: Insufficient documentation

## 2014-07-12 DIAGNOSIS — I10 Essential (primary) hypertension: Secondary | ICD-10-CM | POA: Insufficient documentation

## 2014-07-12 DIAGNOSIS — Z7982 Long term (current) use of aspirin: Secondary | ICD-10-CM | POA: Insufficient documentation

## 2014-07-12 DIAGNOSIS — Z792 Long term (current) use of antibiotics: Secondary | ICD-10-CM | POA: Insufficient documentation

## 2014-07-12 DIAGNOSIS — M79672 Pain in left foot: Secondary | ICD-10-CM | POA: Diagnosis present

## 2014-07-12 DIAGNOSIS — E119 Type 2 diabetes mellitus without complications: Secondary | ICD-10-CM | POA: Insufficient documentation

## 2014-07-12 DIAGNOSIS — Z794 Long term (current) use of insulin: Secondary | ICD-10-CM | POA: Diagnosis not present

## 2014-07-12 DIAGNOSIS — Z87440 Personal history of urinary (tract) infections: Secondary | ICD-10-CM | POA: Diagnosis not present

## 2014-07-12 DIAGNOSIS — G629 Polyneuropathy, unspecified: Secondary | ICD-10-CM | POA: Insufficient documentation

## 2014-07-12 DIAGNOSIS — M79673 Pain in unspecified foot: Secondary | ICD-10-CM

## 2014-07-12 LAB — BASIC METABOLIC PANEL
ANION GAP: 9 (ref 5–15)
BUN: 14 mg/dL (ref 6–23)
CALCIUM: 9.2 mg/dL (ref 8.4–10.5)
CO2: 27 mmol/L (ref 19–32)
CREATININE: 0.63 mg/dL (ref 0.50–1.10)
Chloride: 104 mmol/L (ref 96–112)
GFR calc Af Amer: >90 mL/min (ref >90)
GFR, EST NON AFRICAN AMERICAN: 79 mL/min — AB (ref >90)
GLUCOSE: 156 mg/dL — AB (ref 70–99)
POTASSIUM: 3.4 mmol/L — AB (ref 3.5–5.1)
Sodium: 140 mmol/L (ref 135–145)

## 2014-07-12 LAB — CBC WITH DIFFERENTIAL/PLATELET
BASOS ABS: 0.1 10*3/uL (ref 0.0–0.1)
Basophils Relative: 1 % (ref 0–1)
EOS ABS: 0.3 10*3/uL (ref 0.0–0.7)
Eosinophils Relative: 5 % (ref 0–5)
HEMATOCRIT: 39.5 % (ref 36.0–46.0)
HEMOGLOBIN: 12.6 g/dL (ref 12.0–15.0)
LYMPHS ABS: 1.4 10*3/uL (ref 0.7–4.0)
Lymphocytes Relative: 22 % (ref 12–46)
MCH: 25.5 pg — ABNORMAL LOW (ref 26.0–34.0)
MCHC: 31.9 g/dL (ref 30.0–36.0)
MCV: 80 fL (ref 78.0–100.0)
Monocytes Absolute: 0.9 10*3/uL (ref 0.1–1.0)
Monocytes Relative: 14 % — ABNORMAL HIGH (ref 3–12)
NEUTROS ABS: 3.8 10*3/uL (ref 1.7–7.7)
Neutrophils Relative %: 58 % (ref 43–77)
PLATELETS: 235 10*3/uL (ref 150–400)
RBC: 4.94 MIL/uL (ref 3.87–5.11)
RDW: 16.4 % — ABNORMAL HIGH (ref 11.5–15.5)
WBC: 6.5 10*3/uL (ref 4.0–10.5)

## 2014-07-12 LAB — D-DIMER, QUANTITATIVE (NOT AT ARMC): D DIMER QUANT: 1.16 ug{FEU}/mL — AB (ref 0.00–0.48)

## 2014-07-12 MED ORDER — GABAPENTIN 100 MG PO CAPS
100.0000 mg | ORAL_CAPSULE | Freq: Once | ORAL | Status: AC
  Administered 2014-07-12: 100 mg via ORAL
  Filled 2014-07-12: qty 1

## 2014-07-12 MED ORDER — ONDANSETRON 4 MG PO TBDP
4.0000 mg | ORAL_TABLET | Freq: Once | ORAL | Status: AC
  Administered 2014-07-12: 4 mg via ORAL

## 2014-07-12 MED ORDER — HYDROCODONE-ACETAMINOPHEN 5-325 MG PO TABS
1.0000 | ORAL_TABLET | Freq: Once | ORAL | Status: AC
  Administered 2014-07-12: 1 via ORAL
  Filled 2014-07-12: qty 1

## 2014-07-12 MED ORDER — ONDANSETRON 4 MG PO TBDP
ORAL_TABLET | ORAL | Status: AC
  Administered 2014-07-12: 4 mg via ORAL
  Filled 2014-07-12: qty 1

## 2014-07-12 MED ORDER — HYDROCODONE-ACETAMINOPHEN 5-325 MG PO TABS: 1.0000 | ORAL_TABLET | ORAL | Status: DC | PRN

## 2014-07-12 MED ORDER — ONDANSETRON HCL 4 MG/2ML IJ SOLN: 4.0000 mg | Freq: Once | INTRAMUSCULAR | Status: DC

## 2014-07-12 NOTE — Telephone Encounter (Signed)
fyi

## 2014-07-12 NOTE — ED Notes (Signed)
Dr. James at bedside  

## 2014-07-12 NOTE — ED Notes (Signed)
Pt reporting pain in left foot starting today.  States that pain has been intermittent.  States that she has had previous DVT and pain is similar. States that left foot is more swollen than normal.

## 2014-07-12 NOTE — ED Notes (Signed)
Pt. Reports shooting pains are less frequent after receiving the pain medicine.

## 2014-07-12 NOTE — Discharge Instructions (Signed)
Take the Neurontin 100mg  at night for three nights. If not improving after three nights, increase to 200mg  at night. [place] for pain until improving on neurontin. Check with your primary care physician  Diabetic Neuropathy Diabetic neuropathy is a nerve disease or nerve damage that is caused by diabetes mellitus. About half of all people with diabetes mellitus have some form of nerve damage. Nerve damage is more common in those who have had diabetes mellitus for many years and who generally have not had good control of their blood sugar (glucose) level. Diabetic neuropathy is a common complication of diabetes mellitus. There are three more common types of diabetic neuropathy and a fourth type that is less common and less understood:   Peripheral neuropathy--This is the most common type of diabetic neuropathy. It causes damage to the nerves of the feet and legs first and then eventually the hands and arms.The damage affects the ability to sense touch.  Autonomic neuropathy--This type causes damage to the autonomic nervous system, which controls the following functions:  Heartbeat.  Body temperature.  Blood pressure.  Urination.  Digestion.  Sweating.  Sexual function.  Focal neuropathy--Focal neuropathy can be painful and unpredictable and occurs most often in older adults with diabetes mellitus. It involves a specific nerve or one area and often comes on suddenly. It usually does not cause long-term problems.  Radiculoplexus neuropathy-- Sometimes called lumbosacral radiculoplexus neuropathy, radiculoplexus neuropathy affects the nerves of the thighs, hips, buttocks, or legs. It is more common in people with type 2 diabetes mellitus and in older men. It is characterized by debilitating pain, weakness, and atrophy, usually in the thigh muscles. CAUSES  The cause of peripheral, autonomic, and focal neuropathies is diabetes mellitus that is uncontrolled and high glucose levels. The cause  of radiculoplexus neuropathy is unknown. However, it is thought to be caused by inflammation related to uncontrolled glucose levels. SIGNS AND SYMPTOMS  Peripheral Neuropathy Peripheral neuropathy develops slowly over time. When the nerves of the feet and legs no longer work there may be:   Burning, stabbing, or aching pain in the legs or feet.  Inability to feel pressure or pain in your feet. This can lead to:  Thick calluses over pressure areas.  Pressure sores.  Ulcers.  Foot deformities.  Reduced ability to feel temperature changes.  Muscle weakness. Autonomic Neuropathy The symptoms of autonomic neuropathy vary depending on which nerves are affected. Symptoms may include:  Problems with digestion, such as:  Feeling sick to your stomach (nausea).  Vomiting.  Bloating.  Constipation.  Diarrhea.  Abdominal pain.  Difficulty with urination. This occurs if you lose your ability to sense when your bladder is full. Problems include:  Urine leakage (incontinence).  Inability to empty your bladder completely (retention).  Rapid or irregular heartbeat (palpitations).  Blood pressure drops when you stand up (orthostatic hypotension). When you stand up you may feel:  Dizzy.  Weak.  Faint.  In men, inability to attain and maintain an erection.  In women, vaginal dryness and problems with decreased sexual desire and arousal.  Problems with body temperature regulation.  Increased or decreased sweating. Focal Neuropathy  Abnormal eye movements or abnormal alignment of both eyes.  Weakness in the wrist.  Foot drop. This results in an inability to lift the foot properly and abnormal walking or foot movement.  Paralysis on one side of your face (Bell palsy).  Chest or abdominal pain. Radiculoplexus Neuropathy  Sudden, severe pain in your hip, thigh, or buttocks.  Weakness and wasting of thigh muscles.  Difficulty rising from a seated  position.  Abdominal swelling.  Unexplained weight loss (usually more than 10 lb [4.5 kg]). DIAGNOSIS  Peripheral Neuropathy Your senses may be tested. Sensory function testing can be done with:  A light touch using a monofilament.  A vibration with tuning fork.  A sharp sensation with a pin prick. Other tests that can help diagnose neuropathy are:  Nerve conduction velocity. This test checks the transmission of an electrical current through a nerve.  Electromyography. This shows how muscles respond to electrical signals transmitted by nearby nerves.  Quantitative sensory testing. This is used to assess how your nerves respond to vibrations and changes in temperature. Autonomic Neuropathy Diagnosis is often based on reported symptoms. Tell your health care provider if you experience:   Dizziness.   Constipation.   Diarrhea.   Inappropriate urination or inability to urinate.   Inability to get or maintain an erection.  Tests that may be done include:   Electrocardiography or Holter monitor. These are tests that can help show problems with the heart rate or heart rhythm.   An X-ray exam may be done. Focal Neuropathy Diagnosis is made based on your symptoms and what your health care provider finds during your exam. Other tests may be done. They may include:  Nerve conduction velocities. This checks the transmission of electrical current through a nerve.  Electromyography. This shows how muscles respond to electrical signals transmitted by nearby nerves.  Quantitative sensory testing. This test is used to assess how your nerves respond to vibration and changes in temperature. Radiculoplexus Neuropathy  Often the first thing is to eliminate any other issue or problems that might be the cause, as there is no stick test for diagnosis.  X-ray exam of your spine and lumbar region.  Spinal tap to rule out cancer.  MRI to rule out other lesions. TREATMENT  Once  nerve damage occurs, it cannot be reversed. The goal of treatment is to keep the disease or nerve damage from getting worse and affecting more nerve fibers. Controlling your blood glucose level is the key. Most people with radiculoplexus neuropathy see at least a partial improvement over time. You will need to keep your blood glucose and HbA1c levels in the target range determined by your health care provider. Things that help control blood glucose levels include:   Blood glucose monitoring.   Meal planning.   Physical activity.   Diabetes medicine.  Over time, maintaining lower blood glucose levels helps lessen symptoms. Sometimes, prescription pain medicine is needed. HOME CARE INSTRUCTIONS:  Do not smoke.  Keep your blood glucose level in the range that you and your health care provider have determined acceptable for you.  Keep your blood pressure level in the range that you and your health care provider have determined acceptable for you.  Eat a well-balanced diet.  Be active every day.  Check your feet every day. SEEK MEDICAL CARE IF:   You have burning, stabbing, or aching pain in the legs or feet.  You are unable to feel pressure or pain in your feet.  You develop problems with digestion such as:  Nausea.  Vomiting.  Bloating.  Constipation.  Diarrhea.  Abdominal pain.  You have difficulty with urination, such as:  Incontinence.  Retention.  You have palpitations.  You develop orthostatic hypotension. When you stand up you may feel:  Dizzy.  Weak.  Faint.  You cannot attain and maintain an erection (  in men).  You have vaginal dryness and problems with decreased sexual desire and arousal (in women).  You have severe pain in your thighs, legs, or buttocks.  You have unexplained weight loss. Document Released: 06/30/2001 Document Revised: 02/09/2013 Document Reviewed: 09/30/2012 [facility] Patient Information 2015 [place], [place]. This  information is not intended to replace advice given to you by your health care provider. Make sure you discuss any questions you have with your health care provider.

## 2014-07-12 NOTE — ED Provider Notes (Signed)
CSN: [id number]     Arrival date & time 07/12/14  1956 History  This chart was scribed for Joan Furry, MD by Tula Nakayama, ED Scribe. This patient was seen in room APA19/APA19 and the patient's care was started at 9:00 PM.    No chief complaint on file.  The history is provided by the patient. No language interpreter was used.    HPI Comments: [name] is a 81 y.o. female with a history of DM who presents to the Emergency Department complaining of sudden onset, severe, waxing and waning dorsal left foot pain that started earlier today. Pt reports bilateral leg swelling that started 1 month ago as an associated symptom. She denies pain to the touch. 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 She denies recent falls or injuries. Pt saw her PCP yesterday who prescribed Furosemide for her leg swelling. She took her first dose last night with no increase in urination. Pt also reports that she was diagnosed with DM 2 years ago, but denies a history of neuropathy. Her PCP did put her on Gabapentin, but she stopped taking the treatment because of an adverse reaction. Pt denies vomiting and nausea as associated symptoms.  Past Medical History  Diagnosis Date  . Diabetes mellitus without complication   . Hyperlipidemia   . Hypertension   . Frequent UTI   . Neck pain    Past Surgical History  Procedure Laterality Date  . Appendectomy    . Cholecystectomy    . Tonsillectomy    . Joint replacement      bilat knee    Family History  Problem Relation Age of Onset  . Cancer Mother   . Cancer Father   . Cancer Brother    History  Substance Use Topics  . Smoking status: Never Smoker   . Smokeless tobacco: Not on file  . Alcohol Use: No   OB History    No data available     Review of Systems  Constitutional: Negative for fever, chills, diaphoresis, appetite change and fatigue.  HENT:  Negative for mouth sores, sore throat and trouble swallowing.   Eyes: Negative for visual disturbance.  Respiratory: Negative for cough, chest tightness, shortness of breath and wheezing.   Cardiovascular: Negative for chest pain.  Gastrointestinal: Negative for nausea, vomiting, abdominal pain, diarrhea and abdominal distention.  Endocrine: Negative for polydipsia, polyphagia and polyuria.  Genitourinary: Negative for dysuria, frequency and hematuria.  Musculoskeletal: Positive for joint swelling and arthralgias. Negative for gait problem.  Skin: Negative for color change, pallor and rash.  Neurological: Negative for dizziness, syncope, light-headedness and headaches.  Hematological: Does not bruise/bleed easily.  Psychiatric/Behavioral: Negative for behavioral problems and confusion.      Allergies  Ace inhibitors and Sulfa antibiotics  Home Medications   Prior to Admission medications   Medication Sig Start Date End Date Taking? Authorizing Provider  amLODipine (NORVASC) 5 MG tablet Take 0.5 tablets (2.5 mg total) by mouth daily. 09/02/13  Yes Vernie Shanks, MD  Calcium Carbonate-Vit D-Min (CALCIUM 1200 PO) Take 1 tablet by mouth every other day.    Yes Historical Provider, MD  ciprofloxacin (CIPRO) 250 MG tablet Take 250 mg by mouth at bedtime.   Yes Historical Provider, MD  diltiazem (CARDIZEM) 120 MG tablet TAKE ONE TABLET BY MOUTH ONCE DAILY 06/01/14  Yes Sharion Balloon, FNP  gabapentin (  NEURONTIN) 100 MG capsule Take 2 tabs at bedtime x 3 days if pain is unrelieved take 3 tabs at bedtime for 3 days. If unrelieved take additional tab in AM. Patient taking differently: Take 100-200 mg by mouth 2 (two) times daily. Patient takes 100mg  in the am and 200mg  at night 07/11/14  Yes Wardell Honour, MD  LANTUS SOLOSTAR 100 UNIT/ML Solostar Pen INJECT 30 UNITS SUBCUTANEOUSLY ONCE DAILY AT  10  PM Patient taking differently: INJECT 23UNITS SUBCUTANEOUSLY ONCE DAILY AT  10  PM 03/07/14  Yes  Wardell Honour, MD  MAGNESIUM-ZINC PO Take 15 mg by mouth 3 (three) times daily.   Yes Historical Provider, MD  metFORMIN (GLUCOPHAGE) 500 MG tablet Take 1 tablet (500 mg total) by mouth daily with breakfast. 01/13/14  Yes Wardell Honour, MD  omeprazole (PRILOSEC) 20 MG capsule TAKE ONE CAPSULE BY MOUTH TWICE DAILY BEFORE MEAL(S) 06/15/14  Yes Wardell Honour, MD  pravastatin (PRAVACHOL) 40 MG tablet Take 1 tablet (40 mg total) by mouth daily. 06/09/14  Yes Wardell Honour, MD  Aspirin-Caffeine (BAYER BACK & BODY PAIN EX ST PO) Take 1 tablet by mouth daily as needed (pain).     Historical Provider, MD  B-D ULTRAFINE III SHORT PEN 31G X 8 MM MISC USE AS DIRECTED 03/10/13   Chipper Herb, MD  diazepam (VALIUM) 5 MG tablet Take 1 tablet 1 hour before MRI and 1 daily as needed Patient not taking: Reported on 07/11/2014 06/23/14   Wardell Honour, MD  glucose blood (ONE TOUCH ULTRA TEST) test strip 1 each by Other route 2 (two) times daily. 12/16/13   Chipper Herb, MD  HYDROcodone-acetaminophen (NORCO/VICODIN) 5-325 MG per tablet Take 1 tablet by mouth every 4 (four) hours as needed. 07/12/14   Joan Furry, MD  Lancets [facility] ULTRASOFT) lancets Use as instructed once daily 02/11/13   Vernie Shanks, MD  traMADol (ULTRAM) 50 MG tablet Take one tablet every 4-6 hours PRN Patient taking differently: Take 50 mg by mouth every 6 (six) hours as needed.  07/10/14   Wardell Honour, MD   BP 148/62 mmHg  Pulse 77  Temp(Src) 97.9 F (36.6 C) (Oral)  Resp 20  Ht 4\' 11"  (1.499 m)  Wt 155 lb (70.308 kg)  BMI 31.29 kg/m2  SpO2 94% Physical Exam  Constitutional: She is oriented to person, place, and time. She appears well-developed and well-nourished. No distress.  HENT:  Head: Normocephalic.  Eyes: Conjunctivae are normal. Pupils are equal, round, and reactive to light. No scleral icterus.  Neck: Normal range of motion. Neck supple. No thyromegaly present.  Cardiovascular: Normal rate and regular rhythm.   Exam reveals no gallop and no friction rub.   No murmur heard. Pulmonary/Chest: Effort normal and breath sounds normal. No respiratory distress. She has no wheezes. She has no rales.  Abdominal: Soft. Bowel sounds are normal. She exhibits no distension. There is no tenderness. There is no rebound.  Musculoskeletal: Normal range of motion.  1+ pitting edema to bilateral lower legs, symmetric; intermittent pain without tenderness to the foot  Neurological: She is alert and oriented to person, place, and time.  Skin: Skin is warm and dry. No rash noted.  Psychiatric: She has a normal mood and affect. Her behavior is normal.  Nursing note and vitals reviewed.   ED Course  Procedures   DIAGNOSTIC STUDIES: Oxygen Saturation is 100% on RA, normal by my interpretation.    COORDINATION OF CARE:  8:48 PM Discussed treatment plan with pt at bedside and pt agreed to plan.   Labs Review Labs Reviewed  CBC WITH DIFFERENTIAL/PLATELET - Abnormal; Notable for the following:    MCH 25.5 (*)    RDW 16.4 (*)    Monocytes Relative 14 (*)    All other components within normal limits  BASIC METABOLIC PANEL - Abnormal; Notable for the following:    Potassium 3.4 (*)    Glucose, Bld 156 (*)    GFR calc non Af Amer 79 (*)    All other components within normal limits  D-DIMER, QUANTITATIVE - Abnormal; Notable for the following:    D-Dimer, Quant 1.16 (*)    All other components within normal limits    Imaging Review Dg Foot Complete Left  07/12/2014   CLINICAL DATA:  Sharp throbbing pain across the metatarsals.  EXAM: LEFT FOOT - COMPLETE 3+ VIEW  COMPARISON:  None.  FINDINGS: Mild degenerative changes at the first MTP joint. Negative for an acute fracture or dislocation. Soft tissue prominence at the first MTP joint.  IMPRESSION: No acute bone abnormality in the left foot.   Electronically Signed   By: Markus Daft M.D.   On: 07/12/2014 22:14     EKG Interpretation None      MDM   Final  diagnoses:  Foot pain  Neuropathy   Patient with improvement of symptoms after the medications. Continues to have intermittent episodes of lancinating pain. I do not feel the pain is representative DVT. No acute abnormality on x-ray. No leukocytosis. Elevation of her d-dimer. I've ordered outpatient Doppler ultrasound. My suspicion of DVT is low, although she does have unilateral's worsening swelling on the left. She prefers to not undergo anticoagulation until her ultrasound is obtained tomorrow. I think this is reasonable. Have asked her to resume her Neurontin 100 mg at night. Instructed her to increase this to 200 mg after 2-3 days if not having improvement of her symptoms. Vicodin for breakthrough pain. Follow up with her primary care physician.   Joan Furry, MD 07/12/14 [id number]

## 2014-07-13 ENCOUNTER — Other Ambulatory Visit (HOSPITAL_COMMUNITY): Payer: Self-pay | Admitting: Emergency Medicine

## 2014-07-13 ENCOUNTER — Ambulatory Visit (HOSPITAL_COMMUNITY)
Admit: 2014-07-13 | Discharge: 2014-07-13 | Disposition: A | Discharge status: Home / Self Care | Payer: PPO | Source: Ambulatory Visit | Attending: Emergency Medicine | Admitting: Emergency Medicine

## 2014-07-13 DIAGNOSIS — M79662 Pain in left lower leg: Secondary | ICD-10-CM | POA: Diagnosis not present

## 2014-07-13 DIAGNOSIS — R609 Edema, unspecified: Secondary | ICD-10-CM | POA: Diagnosis present

## 2014-07-14 ENCOUNTER — Telehealth: Payer: Self-pay | Admitting: Family Medicine

## 2014-07-17 MED ORDER — HYDROCODONE-ACETAMINOPHEN 5-325 MG PO TABS: 1.0000 | ORAL_TABLET | ORAL | Status: DC | PRN

## 2014-07-17 NOTE — Telephone Encounter (Signed)
Patient went to ER for severe lower leg/foot pain. Doppler was negative for DVT and foot xray was negative as well.  She was given 6 tablets of hydrocodone for pain. They did relieve the pain. She has 4 tablets left and wanted to know if she could get a prescription to have on hand in case the pain returned.

## 2014-07-17 NOTE — Telephone Encounter (Signed)
Refill hydrocodone per request

## 2014-07-18 MED ORDER — HYDROCODONE-ACETAMINOPHEN 5-325 MG PO TABS: 1.0000 | ORAL_TABLET | ORAL | Status: DC | PRN

## 2014-07-18 NOTE — Addendum Note (Signed)
Addended by: Ilean China on: 07/18/2014 04:54 PM   Modules accepted: Orders

## 2014-07-18 NOTE — Telephone Encounter (Signed)
Dr Sabra Heck is out of the office until Thursday. Will you sign hydrocodone prescription?

## 2014-07-18 NOTE — Addendum Note (Signed)
Addended by: Ilean China on: 07/18/2014 05:08 PM   Modules accepted: Orders

## 2014-07-18 NOTE — Telephone Encounter (Signed)
Patient aware that script can be picked up with photo ID.

## 2014-07-18 NOTE — Telephone Encounter (Signed)
Hydrocodone prescription is okay 1

## 2014-08-14 ENCOUNTER — Telehealth: Payer: Self-pay | Admitting: Family Medicine

## 2014-08-14 MED ORDER — HYDROCODONE-ACETAMINOPHEN 5-325 MG PO TABS: 1.0000 | ORAL_TABLET | ORAL | Status: DC | PRN

## 2014-08-14 NOTE — Telephone Encounter (Signed)
Is okay to refill 1

## 2014-08-14 NOTE — Telephone Encounter (Signed)
Dr Sabra Heck is not back until Wednesday   Please advise  [facility] gave the #10 last month when Sabra Heck was off

## 2014-08-14 NOTE — Telephone Encounter (Signed)
Only sees Dr. Sabra Heck, last seen 07/11/14

## 2014-08-15 ENCOUNTER — Other Ambulatory Visit: Payer: Self-pay | Admitting: *Deleted

## 2014-08-15 MED ORDER — HYDROCODONE-ACETAMINOPHEN 5-325 MG PO TABS: 1.0000 | ORAL_TABLET | ORAL | Status: DC | PRN

## 2014-08-15 NOTE — Telephone Encounter (Signed)
Pt here to pick up 

## 2014-08-17 ENCOUNTER — Ambulatory Visit: Payer: PPO | Admitting: Family Medicine

## 2014-08-23 ENCOUNTER — Ambulatory Visit (INDEPENDENT_AMBULATORY_CARE_PROVIDER_SITE_OTHER): Payer: PPO | Admitting: Family Medicine

## 2014-08-23 ENCOUNTER — Ambulatory Visit (INDEPENDENT_AMBULATORY_CARE_PROVIDER_SITE_OTHER): Payer: PPO

## 2014-08-23 ENCOUNTER — Encounter: Payer: Self-pay | Admitting: Family Medicine

## 2014-08-23 VITALS — BP 143/76 | HR 85 | Temp 97.5°F | Ht 59.0 in | Wt 160.0 lb

## 2014-08-23 DIAGNOSIS — I517 Cardiomegaly: Secondary | ICD-10-CM | POA: Diagnosis not present

## 2014-08-23 DIAGNOSIS — R2681 Unsteadiness on feet: Secondary | ICD-10-CM | POA: Diagnosis not present

## 2014-08-23 DIAGNOSIS — M25551 Pain in right hip: Secondary | ICD-10-CM

## 2014-08-23 MED ORDER — MELOXICAM 7.5 MG PO TABS: 7.5000 mg | ORAL_TABLET | Freq: Every day | ORAL | Status: DC

## 2014-08-23 NOTE — Progress Notes (Signed)
Subjective:    Patient ID: [name], female    DOB: 06-Jan-1926, 81 y.o.   MRN: [id number]  HPI  Chief Complaint  Patient presents with  . Hip Pain    x 2 weeks. Patient was standing in her kitchen and felt a pop in her hip. She has had pain and decreased range of motion since then.   . Shoulder Pain    Persistent right shoulder pain   Patient Active Problem List   Diagnosis Date Noted  . Neck pain   . Frequent UTI   . Grieving 07/14/2013  . HLD (hyperlipidemia) 02/11/2013  . Need for prophylactic vaccination and inoculation against influenza 02/11/2013  . Fibromyalgia 02/11/2013  . Frequency of urination 11/09/2012  . Arthritis 11/09/2012  . UTI (urinary tract infection) 08/02/2012  . Frequency 08/02/2012  . DM (diabetes mellitus) 08/02/2012  . HTN (hypertension) 08/02/2012  . Diabetes 08/01/2008  . CVA 08/01/2008  . DVT 08/01/2008  . OSTEOARTHRITIS 04/09/2007  . HYPERLIPIDEMIA 03/08/2007  . Essential hypertension 03/08/2007  . PULMONARY FIBROSIS, POSTINFLAMMATORY 03/08/2007  . GERD 03/08/2007  . BREAST CANCER, HX OF 03/08/2007   Outpatient Encounter Prescriptions as of 08/23/2014  Medication Sig  . amLODipine (NORVASC) 5 MG tablet Take 0.5 tablets (2.5 mg total) by mouth daily.  . Aspirin-Caffeine (BAYER BACK & BODY PAIN EX ST PO) Take 1 tablet by mouth daily as needed (pain).   . B-D ULTRAFINE III SHORT PEN 31G X 8 MM MISC USE AS DIRECTED  . Calcium Carbonate-Vit D-Min (CALCIUM 1200 PO) Take 1 tablet by mouth every other day.   . ciprofloxacin (CIPRO) 250 MG tablet Take 250 mg by mouth at bedtime.  . diazepam (VALIUM) 5 MG tablet Take 1 tablet 1 hour before MRI and 1 daily as needed  . diltiazem (CARDIZEM) 120 MG tablet TAKE ONE TABLET BY MOUTH ONCE DAILY  . gabapentin (NEURONTIN) 100 MG capsule Take 2 tabs at bedtime x 3 days if pain is unrelieved take 3 tabs at bedtime for 3 days. If unrelieved take additional tab in AM. (Patient taking differently: Take  100-200 mg by mouth 2 (two) times daily. Patient takes 100mg  in the am and 200mg  at night)  . glucose blood (ONE TOUCH ULTRA TEST) test strip 1 each by Other route 2 (two) times daily.  Marland Kitchen HYDROcodone-acetaminophen (NORCO/VICODIN) 5-325 MG per tablet Take 1 tablet by mouth every 4 (four) hours as needed.  . Lancets (ONETOUCH ULTRASOFT) lancets Use as instructed once daily  . LANTUS SOLOSTAR 100 UNIT/ML Solostar Pen INJECT 30 UNITS SUBCUTANEOUSLY ONCE DAILY AT  10  PM (Patient taking differently: INJECT 23UNITS SUBCUTANEOUSLY ONCE DAILY AT  10  PM)  . MAGNESIUM-ZINC PO Take 15 mg by mouth 3 (three) times daily.  . metFORMIN (GLUCOPHAGE) 500 MG tablet Take 1 tablet (500 mg total) by mouth daily with breakfast.  . omeprazole (PRILOSEC) 20 MG capsule TAKE ONE CAPSULE BY MOUTH TWICE DAILY BEFORE MEAL(S)  . pravastatin (PRAVACHOL) 40 MG tablet Take 1 tablet (40 mg total) by mouth daily.  . traMADol (ULTRAM) 50 MG tablet Take one tablet every 4-6 hours PRN (Patient taking differently: Take 50 mg by mouth every 6 (six) hours as needed. )  . [DISCONTINUED] ciprofloxacin (CIPRO) 250 MG tablet Take 250 mg by mouth at bedtime.     Review of Systems  Constitutional: Negative for fever, chills, diaphoresis, appetite change, fatigue and unexpected weight change.  HENT: Negative for congestion, ear pain, hearing loss, postnasal drip,  rhinorrhea, sneezing, sore throat and trouble swallowing.   Eyes: Negative for pain.  Respiratory: Negative for cough, chest tightness and shortness of breath.   Cardiovascular: Negative for chest pain and palpitations.  Gastrointestinal: Negative for nausea, vomiting, abdominal pain, diarrhea and constipation.  Genitourinary: Negative for dysuria, frequency and menstrual problem.  Musculoskeletal: Positive for arthralgias. Negative for joint swelling.  Skin: Negative for rash.  Neurological: Negative for dizziness, weakness, numbness and headaches.  Psychiatric/Behavioral:  Negative for dysphoric mood and agitation.       Objective:   Physical Exam  Constitutional: She is oriented to person, place, and time. She appears well-developed and well-nourished. No distress.  HENT:  Head: Normocephalic and atraumatic.  Right Ear: External ear normal.  Left Ear: External ear normal.  Nose: Nose normal.  Mouth/Throat: Oropharynx is clear and moist.  Eyes: Conjunctivae and EOM are normal. Pupils are equal, round, and reactive to light.  Neck: Normal range of motion. Neck supple. No thyromegaly present.  Cardiovascular: Normal rate, regular rhythm and normal heart sounds.   No murmur heard. Pulmonary/Chest: Effort normal and breath sounds normal. No respiratory distress. She has no wheezes. She has no rales.  Abdominal: Soft. Bowel sounds are normal. She exhibits no distension. There is no tenderness.  Lymphadenopathy:    She has no cervical adenopathy.  Neurological: She is alert and oriented to person, place, and time. She has normal reflexes.  Skin: Skin is warm and dry.  Psychiatric: She has a normal mood and affect. Her behavior is normal. Judgment and thought content normal.    BP 143/76 mmHg  Pulse 85  Temp(Src) 97.5 F (36.4 C) (Oral)  Ht 4\' 11"  (1.499 m)  Wt 160 lb (72.576 kg)  BMI 32.30 kg/m2       Assessment & Plan:   1. Unsteady gait   2. Hip pain, right   3. Cardiomegaly     Meds ordered this encounter  Medications  . ciprofloxacin (CIPRO) 250 MG tablet    Sig: Take 250 mg by mouth at bedtime.  . meloxicam (MOBIC) 7.5 MG tablet    Sig: Take 1 tablet (7.5 mg total) by mouth daily.    Dispense:  30 tablet    Refill:  2    Orders Placed This Encounter  Procedures  . DG Chest 2 View    Standing Status: Future     Number of Occurrences: 1     Standing Expiration Date: 10/23/2015    Order Specific Question:  Reason for Exam (SYMPTOM  OR DIAGNOSIS REQUIRED)    Answer:  pain, unstable gait    Order Specific Question:  Preferred  imaging location?    Answer:  Internal  . DG HIP UNILAT WITH PELVIS 2-3 VIEWS RIGHT    Standing Status: Future     Number of Occurrences: 1     Standing Expiration Date: 10/23/2015    Order Specific Question:  Reason for Exam (SYMPTOM  OR DIAGNOSIS REQUIRED)    Answer:  hipp pain    Order Specific Question:  Preferred imaging location?    Answer:  Internal  . Ambulatory referral to Physical Therapy    Referral Priority:  Routine    Referral Type:  Physical Medicine    Referral Reason:  Specialty Services Required    Requested Specialty:  Physical Therapy    Number of Visits Requested:  1    Labs pending Health Maintenance reviewed Diet and exercise encouraged Continue all meds as discussed Follow up in  6 months  Claretta Fraise, MD

## 2014-08-28 ENCOUNTER — Telehealth (HOSPITAL_COMMUNITY): Payer: Self-pay

## 2014-08-28 NOTE — Telephone Encounter (Signed)
08/28/14 Called to schedule the eval and patient said she just couldn't do right now.  She has to many other medical bills and she just doesn't see how she can do at this time.

## 2014-09-07 ENCOUNTER — Ambulatory Visit (INDEPENDENT_AMBULATORY_CARE_PROVIDER_SITE_OTHER): Payer: PPO | Admitting: Family Medicine

## 2014-09-07 ENCOUNTER — Encounter: Payer: Self-pay | Admitting: Family Medicine

## 2014-09-07 VITALS — BP 131/76 | HR 80 | Temp 96.9°F | Ht 59.0 in | Wt 161.4 lb

## 2014-09-07 DIAGNOSIS — R3915 Urgency of urination: Secondary | ICD-10-CM | POA: Diagnosis not present

## 2014-09-07 DIAGNOSIS — N3090 Cystitis, unspecified without hematuria: Secondary | ICD-10-CM | POA: Diagnosis not present

## 2014-09-07 LAB — POCT UA - MICROSCOPIC ONLY
CRYSTALS, UR, HPF, POC: NEGATIVE
Casts, Ur, LPF, POC: NEGATIVE
YEAST UA: NEGATIVE

## 2014-09-07 LAB — POCT URINALYSIS DIPSTICK
Bilirubin, UA: NEGATIVE
Glucose, UA: NEGATIVE
Ketones, UA: NEGATIVE
NITRITE UA: NEGATIVE
PH UA: 8
Spec Grav, UA: <=1.005
UROBILINOGEN UA: NEGATIVE

## 2014-09-07 MED ORDER — NITROFURANTOIN MONOHYD MACRO 100 MG PO CAPS: 100.0000 mg | ORAL_CAPSULE | Freq: Two times a day (BID) | ORAL | Status: DC

## 2014-09-07 MED ORDER — CEPHALEXIN 500 MG PO CAPS: 500.0000 mg | ORAL_CAPSULE | Freq: Three times a day (TID) | ORAL | Status: DC

## 2014-09-07 NOTE — Progress Notes (Signed)
Subjective:  Patient ID: [name], female    DOB: February 08, 1926  Age: 81 y.o. MRN: [id number]  CC: Urinary Tract Infection   HPI MADGE THERRIEN presents for 2 weeks. Increasing UTI symptoms. She used these oh and cranberry juice and increased water. Denies any back pain denies any abdominal pain. She is not had any fever chills sweats or nausea. No diarrhea. She has had frequency of urination and dysuria. She has not had incontinence  History Everette has a past medical history of Diabetes mellitus without complication; Hyperlipidemia; Hypertension; Frequent UTI; and Neck pain.   She has past surgical history that includes Appendectomy; Cholecystectomy; Tonsillectomy; and Joint replacement.   Her family history includes Cancer in her brother, father, and mother.She reports that she has never smoked. She has never used smokeless tobacco. She reports that she does not drink alcohol or use illicit drugs.  Current Outpatient Prescriptions on File Prior to Visit  Medication Sig Dispense Refill  . amLODipine (NORVASC) 5 MG tablet Take 0.5 tablets (2.5 mg total) by mouth daily. 90 tablet 1  . Aspirin-Caffeine (BAYER BACK & BODY PAIN EX ST PO) Take 1 tablet by mouth daily as needed (pain).     . B-D ULTRAFINE III SHORT PEN 31G X 8 MM MISC USE AS DIRECTED 100 each 2  . Calcium Carbonate-Vit D-Min (CALCIUM 1200 PO) Take 1 tablet by mouth every other day.     . ciprofloxacin (CIPRO) 250 MG tablet Take 250 mg by mouth at bedtime.    . diazepam (VALIUM) 5 MG tablet Take 1 tablet 1 hour before MRI and 1 daily as needed 12 tablet 0  . diltiazem (CARDIZEM) 120 MG tablet TAKE ONE TABLET BY MOUTH ONCE DAILY 60 tablet 2  . gabapentin (NEURONTIN) 100 MG capsule Take 2 tabs at bedtime x 3 days if pain is unrelieved take 3 tabs at bedtime for 3 days. If unrelieved take additional tab in AM. (Patient taking differently: Take 100-200 mg by mouth 2 (two) times daily. Patient takes 100mg  in the am and 200mg  at  night) 90 capsule 1  . glucose blood (ONE TOUCH ULTRA TEST) test strip 1 each by Other route 2 (two) times daily. 100 each 3  . HYDROcodone-acetaminophen (NORCO/VICODIN) 5-325 MG per tablet Take 1 tablet by mouth every 4 (four) hours as needed. 10 tablet 0  . Lancets (ONETOUCH ULTRASOFT) lancets Use as instructed once daily 100 each 3  . LANTUS SOLOSTAR 100 UNIT/ML Solostar Pen INJECT 30 UNITS SUBCUTANEOUSLY ONCE DAILY AT  10  PM (Patient taking differently: INJECT 23UNITS SUBCUTANEOUSLY ONCE DAILY AT  10  PM) 15 pen 2  . MAGNESIUM-ZINC PO Take 15 mg by mouth 3 (three) times daily.    . meloxicam (MOBIC) 7.5 MG tablet Take 1 tablet (7.5 mg total) by mouth daily. 30 tablet 2  . metFORMIN (GLUCOPHAGE) 500 MG tablet Take 1 tablet (500 mg total) by mouth daily with breakfast. 90 tablet 0  . omeprazole (PRILOSEC) 20 MG capsule TAKE ONE CAPSULE BY MOUTH TWICE DAILY BEFORE MEAL(S) 180 capsule 1  . pravastatin (PRAVACHOL) 40 MG tablet Take 1 tablet (40 mg total) by mouth daily. 90 tablet 0  . traMADol (ULTRAM) 50 MG tablet Take one tablet every 4-6 hours PRN (Patient taking differently: Take 50 mg by mouth every 6 (six) hours as needed. ) 30 tablet 0   No current facility-administered medications on file prior to visit.    ROS Review of Systems  Constitutional: Negative  for fever, chills and diaphoresis.  HENT: Negative for congestion.   Eyes: Negative for visual disturbance.  Respiratory: Negative for cough and shortness of breath.   Cardiovascular: Negative for chest pain and palpitations.  Gastrointestinal: Negative for nausea, diarrhea and constipation.  Genitourinary: Positive for dysuria, urgency and frequency. Negative for hematuria, flank pain, decreased urine volume, menstrual problem and pelvic pain.  Musculoskeletal: Negative for joint swelling and arthralgias.  Skin: Negative for rash.  Neurological: Negative for dizziness and numbness.    Objective:  BP 131/76 mmHg  Pulse 80   Temp(Src) 96.9 F (36.1 C) (Oral)  Ht 4\' 11"  (1.499 m)  Wt 161 lb 6.4 oz (73.211 kg)  BMI 32.58 kg/m2  Physical Exam  Constitutional: She is oriented to person, place, and time. She appears well-developed and well-nourished. No distress.  HENT:  Head: Normocephalic and atraumatic.  Right Ear: External ear normal.  Left Ear: External ear normal.  Nose: Nose normal.  Mouth/Throat: Oropharynx is clear and moist.  Eyes: Conjunctivae and EOM are normal. Pupils are equal, round, and reactive to light.  Neck: Normal range of motion. Neck supple. No thyromegaly present.  Cardiovascular: Normal rate, regular rhythm and normal heart sounds.   No murmur heard. Pulmonary/Chest: Effort normal and breath sounds normal. No respiratory distress. She has no wheezes. She has no rales.  Abdominal: Soft. Bowel sounds are normal. She exhibits no distension. There is no tenderness.  Lymphadenopathy:    She has no cervical adenopathy.  Neurological: She is alert and oriented to person, place, and time. She has normal reflexes.  Skin: Skin is warm and dry.  Psychiatric: She has a normal mood and affect. Her behavior is normal. Judgment and thought content normal.    Assessment & Plan:   Latasha was seen today for urinary tract infection.  Diagnoses and all orders for this visit:  Urinary urgency Orders: -     POCT urinalysis dipstick -     POCT UA - Microscopic Only -     Urine culture  Cystitis  Other orders -     nitrofurantoin, macrocrystal-monohydrate, (MACROBID) 100 MG capsule; Take 1 capsule (100 mg total) by mouth 2 (two) times daily. -     cephALEXin (KEFLEX) 500 MG capsule; Take 1 capsule (500 mg total) by mouth 3 (three) times daily.   I am having Ms. Mckone start on nitrofurantoin (macrocrystal-monohydrate) and cephALEXin. I am also having her maintain her Aspirin-Caffeine (BAYER BACK & BODY PAIN EX ST PO), Calcium Carbonate-Vit D-Min (CALCIUM 1200 PO), MAGNESIUM-ZINC PO, onetouch  ultrasoft, B-D ULTRAFINE III SHORT PEN, amLODipine, glucose blood, metFORMIN, LANTUS SOLOSTAR, diltiazem, pravastatin, omeprazole, diazepam, traMADol, gabapentin, HYDROcodone-acetaminophen, ciprofloxacin, and meloxicam.  Meds ordered this encounter  Medications  . nitrofurantoin, macrocrystal-monohydrate, (MACROBID) 100 MG capsule    Sig: Take 1 capsule (100 mg total) by mouth 2 (two) times daily.    Dispense:  14 capsule    Refill:  0  . cephALEXin (KEFLEX) 500 MG capsule    Sig: Take 1 capsule (500 mg total) by mouth 3 (three) times daily.    Dispense:  21 capsule    Refill:  0     Follow-up: Return if symptoms worsen or fail to improve and as previously arranged for routine check.  Claretta Fraise, M.D.

## 2014-09-08 ENCOUNTER — Telehealth: Payer: Self-pay | Admitting: Family Medicine

## 2014-09-09 LAB — URINE CULTURE

## 2014-09-11 ENCOUNTER — Telehealth: Payer: Self-pay | Admitting: Family Medicine

## 2014-09-11 ENCOUNTER — Other Ambulatory Visit: Payer: Self-pay | Admitting: Family Medicine

## 2014-09-11 MED ORDER — AMOXICILLIN-POT CLAVULANATE 875-125 MG PO TABS: 1.0000 | ORAL_TABLET | Freq: Two times a day (BID) | ORAL | Status: DC

## 2014-09-11 NOTE — Telephone Encounter (Signed)
Patient aware that antibiotic needed to be changed due to urine culture.

## 2014-09-15 ENCOUNTER — Telehealth: Payer: Self-pay | Admitting: Family Medicine

## 2014-09-20 ENCOUNTER — Other Ambulatory Visit: Payer: Self-pay | Admitting: Family Medicine

## 2014-09-20 NOTE — Telephone Encounter (Signed)
Last lipid or liver 07/2013

## 2014-09-29 ENCOUNTER — Telehealth: Payer: Self-pay | Admitting: Family Medicine

## 2014-09-29 MED ORDER — HYDROCODONE-ACETAMINOPHEN 5-325 MG PO TABS: 1.0000 | ORAL_TABLET | ORAL | Status: DC | PRN

## 2014-09-29 NOTE — Telephone Encounter (Signed)
This prescription is okay 1. The patient usually sees Dr. Sabra Heck. Please make sure that for any future refills that he is aware of this.

## 2014-10-15 ENCOUNTER — Other Ambulatory Visit: Payer: Self-pay | Admitting: Family Medicine

## 2014-10-24 ENCOUNTER — Other Ambulatory Visit: Payer: Self-pay

## 2014-10-24 MED ORDER — AMLODIPINE BESYLATE 5 MG PO TABS: 2.5000 mg | ORAL_TABLET | Freq: Every day | ORAL | Status: DC

## 2014-11-01 ENCOUNTER — Ambulatory Visit (INDEPENDENT_AMBULATORY_CARE_PROVIDER_SITE_OTHER): Payer: PPO | Admitting: Family

## 2014-11-01 ENCOUNTER — Encounter: Payer: Self-pay | Admitting: Family

## 2014-11-01 VITALS — BP 139/79 | HR 79 | Temp 96.8°F | Ht 59.0 in | Wt 161.0 lb

## 2014-11-01 DIAGNOSIS — N390 Urinary tract infection, site not specified: Secondary | ICD-10-CM | POA: Diagnosis not present

## 2014-11-01 DIAGNOSIS — R300 Dysuria: Secondary | ICD-10-CM | POA: Diagnosis not present

## 2014-11-01 DIAGNOSIS — R319 Hematuria, unspecified: Secondary | ICD-10-CM | POA: Diagnosis not present

## 2014-11-01 LAB — POCT URINALYSIS DIPSTICK
BILIRUBIN UA: NEGATIVE
Glucose, UA: NEGATIVE
KETONES UA: NEGATIVE
Nitrite, UA: NEGATIVE
PH UA: 7.5
SPEC GRAV UA: 1.01
Urobilinogen, UA: NEGATIVE

## 2014-11-01 LAB — POCT UA - MICROSCOPIC ONLY
CRYSTALS, UR, HPF, POC: NEGATIVE
Casts, Ur, LPF, POC: NEGATIVE
MUCUS UA: NEGATIVE
YEAST UA: NEGATIVE

## 2014-11-01 MED ORDER — NITROFURANTOIN MONOHYD MACRO 100 MG PO CAPS: 100.0000 mg | ORAL_CAPSULE | Freq: Two times a day (BID) | ORAL | Status: DC

## 2014-11-01 NOTE — Progress Notes (Signed)
   Subjective:    Patient ID: [name], female    DOB: 1926/02/18, 81 y.o.   MRN: [id number]  Dysuria  This is a new problem. The current episode started in the past 7 days (Monday). The problem occurs every urination. The problem has been unchanged. The quality of the pain is described as aching and burning. The pain is at a severity of 8/10. The patient is experiencing no pain. Associated symptoms include flank pain, frequency, hesitancy and urgency. Pertinent negatives include no chills, discharge, hematuria or nausea. Treatments tried: AZO, cranrx, and cranberry tablets. The treatment provided mild relief.      Review of Systems  Constitutional: Negative.  Negative for chills.  HENT: Negative.   Eyes: Negative.   Respiratory: Negative.  Negative for shortness of breath.   Cardiovascular: Negative.  Negative for palpitations.  Gastrointestinal: Negative.  Negative for nausea.  Endocrine: Negative.   Genitourinary: Positive for dysuria, hesitancy, urgency, frequency and flank pain. Negative for hematuria.  Musculoskeletal: Negative.   Neurological: Negative.  Negative for headaches.  Hematological: Negative.   Psychiatric/Behavioral: Negative.   All other systems reviewed and are negative.      Objective:   Physical Exam  Constitutional: She is oriented to person, place, and time. She appears well-developed and well-nourished. No distress.  HENT:  Head: Normocephalic and atraumatic.  Eyes: Pupils are equal, round, and reactive to light.  Neck: Normal range of motion. Neck supple. No thyromegaly present.  Cardiovascular: Normal rate, regular rhythm, normal heart sounds and intact distal pulses.   No murmur heard. Pulmonary/Chest: Effort normal and breath sounds normal. No respiratory distress. She has no wheezes.  Abdominal: Soft. Bowel sounds are normal. She exhibits no distension. There is no tenderness.  Musculoskeletal: Normal range of motion. She exhibits edema (2+  in BLE). She exhibits no tenderness.  Negative for CVA tenderness   Neurological: She is alert and oriented to person, place, and time. She has normal reflexes. No cranial nerve deficit.  Skin: Skin is warm and dry.  Psychiatric: She has a normal mood and affect. Her behavior is normal. Judgment and thought content normal.  Vitals reviewed.   BP 139/79 mmHg  Pulse 79  Temp(Src) 96.8 F (36 C)  Ht 4\' 11"  (1.499 m)  Wt 161 lb (73.029 kg)  BMI 32.50 kg/m2       Assessment & Plan:  1. Dysuria - POCT urinalysis dipstick - POCT UA - Microscopic Only  2. Urinary tract infection with hematuria, site unspecified -Force fluids AZO over the counter X2 days RTO prn Culture pending - nitrofurantoin, macrocrystal-monohydrate, (MACROBID) 100 MG capsule; Take 1 capsule (100 mg total) by mouth 2 (two) times daily.  Dispense: 10 capsule; Refill: 0 - Urine culture   Evelina Dun, FNP

## 2014-11-01 NOTE — Patient Instructions (Addendum)
Urinary Tract Infection Urinary tract infections (UTIs) can develop anywhere along your urinary tract. Your urinary tract is your body's drainage system for removing wastes and extra water. Your urinary tract includes two kidneys, two ureters, a bladder, and a urethra. Your kidneys are a pair of bean-shaped organs. Each kidney is about the size of your fist. They are located below your ribs, one on each side of your spine. CAUSES Infections are caused by microbes, which are microscopic organisms, including fungi, viruses, and bacteria. These organisms are so small that they can only be seen through a microscope. Bacteria are the microbes that most commonly cause UTIs. SYMPTOMS  Symptoms of UTIs may vary by age and gender of the patient and by the location of the infection. Symptoms in young women typically include a frequent and intense urge to urinate and a painful, burning feeling in the bladder or urethra during urination. Older women and men are more likely to be tired, shaky, and weak and have muscle aches and abdominal pain. A fever may mean the infection is in your kidneys. Other symptoms of a kidney infection include pain in your back or sides below the ribs, nausea, and vomiting. DIAGNOSIS To diagnose a UTI, your caregiver will ask you about your symptoms. Your caregiver also will ask to provide a urine sample. The urine sample will be tested for bacteria and white blood cells. White blood cells are made by your body to help fight infection. TREATMENT  Typically, UTIs can be treated with medication. Because most UTIs are caused by a bacterial infection, they usually can be treated with the use of antibiotics. The choice of antibiotic and length of treatment depend on your symptoms and the type of bacteria causing your infection. HOME CARE INSTRUCTIONS  If you were prescribed antibiotics, take them exactly as your caregiver instructs you. Finish the medication even if you feel better after you  have only taken some of the medication.  Drink enough water and fluids to keep your urine clear or pale yellow.  Avoid caffeine, tea, and carbonated beverages. They tend to irritate your bladder.  Empty your bladder often. Avoid holding urine for long periods of time.  Empty your bladder before and after sexual intercourse.  After a bowel movement, women should cleanse from front to back. Use each tissue only once. SEEK MEDICAL CARE IF:   You have back pain.  You develop a fever.  Your symptoms do not begin to resolve within 3 days. SEEK IMMEDIATE MEDICAL CARE IF:   You have severe back pain or lower abdominal pain.  You develop chills.  You have nausea or vomiting.  You have continued burning or discomfort with urination. MAKE SURE YOU:   Understand these instructions.  Will watch your condition.  Will get help right away if you are not doing well or get worse. Document Released: 01/29/2005 Document Revised: 10/21/2011 Document Reviewed: 05/30/2011 [facility] Patient Information 2015 [place], [place]. This information is not intended to replace advice given to you by your health care provider. Make sure you discuss any questions you have with your health care provider. Peripheral Edema You have swelling in your legs (peripheral edema). This swelling is due to excess accumulation of salt and water in your body. Edema may be a sign of heart, kidney or liver disease, or a side effect of a medication. It may also be due to problems in the leg veins. Elevating your legs and using special support stockings may be very helpful, if the  cause of the swelling is due to poor venous circulation. Avoid long periods of standing, whatever the cause. Treatment of edema depends on identifying the cause. Chips, pretzels, pickles and other salty foods should be avoided. Restricting salt in your diet is almost always needed. Water pills (diuretics) are often used to remove the excess salt and water  from your body via urine. These medicines prevent the kidney from reabsorbing sodium. This increases urine flow. Diuretic treatment may also result in lowering of potassium levels in your body. Potassium supplements may be needed if you have to use diuretics daily. Daily weights can help you keep track of your progress in clearing your edema. You should call your caregiver for follow up care as recommended. SEEK IMMEDIATE MEDICAL CARE IF:   You have increased swelling, pain, redness, or heat in your legs.  You develop shortness of breath, especially when lying down.  You develop chest or abdominal pain, weakness, or fainting.  You have a fever. Document Released: 05/29/2004 Document Revised: 07/14/2011 Document Reviewed: 05/09/2009 [facility] Patient Information 2015 [place], [place]. This information is not intended to replace advice given to you by your health care provider. Make sure you discuss any questions you have with your health care provider.

## 2014-11-03 LAB — URINE CULTURE

## 2014-11-04 ENCOUNTER — Other Ambulatory Visit: Payer: Self-pay | Admitting: Nurse Practitioner

## 2014-11-04 MED ORDER — AMOXICILLIN-POT CLAVULANATE 875-125 MG PO TABS: 1.0000 | ORAL_TABLET | Freq: Two times a day (BID) | ORAL | Status: DC

## 2014-11-14 ENCOUNTER — Ambulatory Visit (INDEPENDENT_AMBULATORY_CARE_PROVIDER_SITE_OTHER): Payer: PPO | Admitting: Family Medicine

## 2014-11-14 ENCOUNTER — Telehealth: Payer: Self-pay | Admitting: Family Medicine

## 2014-11-14 ENCOUNTER — Encounter: Payer: Self-pay | Admitting: Family Medicine

## 2014-11-14 VITALS — BP 141/78 | HR 86 | Temp 98.2°F | Ht 59.0 in | Wt 162.2 lb

## 2014-11-14 DIAGNOSIS — I10 Essential (primary) hypertension: Secondary | ICD-10-CM | POA: Diagnosis not present

## 2014-11-14 DIAGNOSIS — E559 Vitamin D deficiency, unspecified: Secondary | ICD-10-CM

## 2014-11-14 DIAGNOSIS — R609 Edema, unspecified: Secondary | ICD-10-CM

## 2014-11-14 DIAGNOSIS — N390 Urinary tract infection, site not specified: Secondary | ICD-10-CM | POA: Diagnosis not present

## 2014-11-14 DIAGNOSIS — E1142 Type 2 diabetes mellitus with diabetic polyneuropathy: Secondary | ICD-10-CM

## 2014-11-14 DIAGNOSIS — E785 Hyperlipidemia, unspecified: Secondary | ICD-10-CM

## 2014-11-14 DIAGNOSIS — Z139 Encounter for screening, unspecified: Secondary | ICD-10-CM | POA: Diagnosis not present

## 2014-11-14 LAB — POCT CBC
Granulocyte percent: 75.2 %G (ref 37–80)
HEMATOCRIT: 41.5 % (ref 37.7–47.9)
Hemoglobin: 13.2 g/dL (ref 12.2–16.2)
LYMPH, POC: 1.4 (ref 0.6–3.4)
MCH, POC: 24.8 pg — AB (ref 27–31.2)
MCHC: 31.9 g/dL (ref 31.8–35.4)
MCV: 77.7 fL — AB (ref 80–97)
MPV: 8.3 fL (ref 0–99.8)
PLATELET COUNT, POC: 267 10*3/uL (ref 142–424)
POC Granulocyte: 5.3 (ref 2–6.9)
POC LYMPH %: 19.3 % (ref 10–50)
RBC: 5.34 M/uL (ref 4.04–5.48)
RDW, POC: 16.4 %
WBC: 7.1 10*3/uL (ref 4.6–10.2)

## 2014-11-14 LAB — POCT UA - MICROSCOPIC ONLY
Casts, Ur, LPF, POC: NEGATIVE
Crystals, Ur, HPF, POC: NEGATIVE
MUCUS UA: NEGATIVE
Yeast, UA: NEGATIVE

## 2014-11-14 LAB — POCT URINALYSIS DIPSTICK
Bilirubin, UA: NEGATIVE
Glucose, UA: NEGATIVE
Ketones, UA: NEGATIVE
Leukocytes, UA: NEGATIVE
Nitrite, UA: NEGATIVE
Spec Grav, UA: 1.01
Urobilinogen, UA: NEGATIVE
pH, UA: 7

## 2014-11-14 LAB — POCT GLYCOSYLATED HEMOGLOBIN (HGB A1C): Hemoglobin A1C: 7

## 2014-11-14 MED ORDER — GABAPENTIN 300 MG PO CAPS: ORAL_CAPSULE | ORAL | Status: DC

## 2014-11-14 MED ORDER — TRIAMTERENE-HCTZ 37.5-25 MG PO TABS: 0.5000 | ORAL_TABLET | Freq: Every day | ORAL | Status: DC

## 2014-11-14 MED ORDER — HYDROCODONE-ACETAMINOPHEN 5-325 MG PO TABS: 1.0000 | ORAL_TABLET | ORAL | Status: DC | PRN

## 2014-11-14 NOTE — Addendum Note (Signed)
Addended by: Marin Olp on: 11/14/2014 05:39 PM   Modules accepted: Orders

## 2014-11-14 NOTE — Progress Notes (Signed)
Subjective:  Patient ID: [name], female    DOB: 08/07/1925  Age: 81 y.o. MRN: [id number]  CC: Diabetes; Hypertension; Hyperlipidemia; and Gastrophageal Reflux   HPI [name] presents for  follow-up of hypertension. Patient has no history of headache chest pain or shortness of breath or recent cough. Patient also denies symptoms of TIA such as numbness weakness lateralizing. Patient checks  blood pressure at home and has had several elevated readings recently. Log reviewed, attached. Patient denies side effects from his medication. States taking it regularly.  Patient also  in for follow-up of elevated cholesterol. Doing well without complaints on current medication. Denies side effects of statin including myalgia and arthralgia and nausea. Also in today for liver function testing. Currently no chest pain, shortness of breath or other cardiovascular related symptoms noted.  Follow-up of diabetes. Patient does check blood sugar at home. Readings run between 90 and 150 Fasting, 106-260 PP> Log attached, reviewed. Patient denies symptoms such as polyuria, polydipsia, excessive hunger, nausea No significant hypoglycemic spells noted. Medications as noted below. Taking them regularly without complication/adverse reaction being reported today.   Patient in for follow-up of GERD. Currently asymptomatic taking  PPI daily. There is no chest pain or heartburn. No hematemesis and no melena. No dysphagia or choking. Onset is remote. Progression is stable. Complicating factors, none.    History [name] has a past medical history of Diabetes mellitus without complication; Hyperlipidemia; Hypertension; Frequent UTI; and Neck pain.   She has past surgical history that includes Appendectomy; Cholecystectomy; Tonsillectomy; and Joint replacement.   Her family history includes Cancer in her brother, father, and mother.She reports that she has never smoked. She has never used smokeless tobacco.  She reports that she does not drink alcohol or use illicit drugs.  Current Outpatient Prescriptions on File Prior to Visit  Medication Sig Dispense Refill  . amLODipine (NORVASC) 5 MG tablet Take 0.5 tablets (2.5 mg total) by mouth daily. 90 tablet 0  . Aspirin-Caffeine (BAYER BACK & BODY PAIN EX ST PO) Take 1 tablet by mouth daily as needed (pain).     . Calcium Carbonate-Vit D-Min (CALCIUM 1200 PO) Take 1 tablet by mouth every other day.     . diazepam (VALIUM) 5 MG tablet Take 1 tablet 1 hour before MRI and 1 daily as needed 12 tablet 0  . diltiazem (CARDIZEM) 120 MG tablet TAKE ONE TABLET BY MOUTH ONCE DAILY 60 tablet 2  . HYDROcodone-acetaminophen (NORCO/VICODIN) 5-325 MG per tablet Take 1 tablet by mouth every 4 (four) hours as needed. 30 tablet 0  . LANTUS SOLOSTAR 100 UNIT/ML Solostar Pen INJECT 30 UNITS SUBCUTANEOUSLY ONCE DAILY AT 10PM 15 pen 1  . MAGNESIUM-ZINC PO Take 15 mg by mouth 3 (three) times daily.    . meloxicam (MOBIC) 7.5 MG tablet Take 1 tablet (7.5 mg total) by mouth daily. 30 tablet 2  . metFORMIN (GLUCOPHAGE) 500 MG tablet Take 1 tablet (500 mg total) by mouth daily with breakfast. 90 tablet 0  . omeprazole (PRILOSEC) 20 MG capsule TAKE ONE CAPSULE BY MOUTH TWICE DAILY BEFORE MEAL(S) 180 capsule 1  . pravastatin (PRAVACHOL) 40 MG tablet TAKE ONE TABLET BY MOUTH ONCE DAILY 90 tablet 0  . ciprofloxacin (CIPRO) 250 MG tablet Take 250 mg by mouth at bedtime.     No current facility-administered medications on file prior to visit.    ROS Review of Systems  Constitutional: Negative for fever, chills, diaphoresis, appetite change, fatigue and unexpected weight  change.  HENT: Negative for congestion, ear pain, hearing loss, postnasal drip, rhinorrhea, sneezing, sore throat and trouble swallowing.   Eyes: Negative for pain.  Respiratory: Negative for cough, chest tightness and shortness of breath.   Cardiovascular: Positive for leg swelling. Negative for chest pain and  palpitations.  Gastrointestinal: Negative for nausea, vomiting, abdominal pain, diarrhea and constipation.  Genitourinary: Negative for dysuria, frequency and menstrual problem.  Musculoskeletal: Negative for joint swelling and arthralgias.  Skin: Negative for rash.  Neurological: Negative for dizziness, weakness, numbness and headaches.  Psychiatric/Behavioral: Negative for dysphoric mood and agitation.    Objective:  BP 141/78 mmHg  Pulse 86  Temp(Src) 98.2 F (36.8 C) (Oral)  Ht $R'4\' 11"'uY$  (1.499 m)  Wt 162 lb 3.2 oz (73.573 kg)  BMI 32.74 kg/m2  BP Readings from Last 3 Encounters:  11/14/14 141/78  11/01/14 139/79  09/07/14 131/76    Wt Readings from Last 3 Encounters:  11/14/14 162 lb 3.2 oz (73.573 kg)  11/01/14 161 lb (73.029 kg)  09/07/14 161 lb 6.4 oz (73.211 kg)     Physical Exam  Constitutional: She is oriented to person, place, and time. She appears well-developed and well-nourished. No distress.  HENT:  Head: Normocephalic and atraumatic.  Right Ear: External ear normal.  Left Ear: External ear normal.  Nose: Nose normal.  Mouth/Throat: Oropharynx is clear and moist.  Eyes: Conjunctivae and EOM are normal. Pupils are equal, round, and reactive to light.  Neck: Normal range of motion. Neck supple. No thyromegaly present.  Cardiovascular: Normal rate, regular rhythm, normal heart sounds and intact distal pulses.   No murmur heard. Pulmonary/Chest: Effort normal and breath sounds normal. No respiratory distress. She has no wheezes. She has no rales.  Abdominal: Soft. Bowel sounds are normal. She exhibits no distension. There is no tenderness.  Musculoskeletal: She exhibits edema (2+ at ankles, lower legs).  Lymphadenopathy:    She has no cervical adenopathy.  Neurological: She is alert and oriented to person, place, and time. She has normal reflexes.  Skin: Skin is warm and dry.  Psychiatric: She has a normal mood and affect. Her behavior is normal. Judgment  and thought content normal.    Lab Results  Component Value Date   HGBA1C 7.0 11/14/2014   HGBA1C 6.2 01/06/2014   HGBA1C 6.5% 07/14/2013    Lab Results  Component Value Date   WBC 7.1 11/14/2014   HGB 13.2 11/14/2014   HCT 41.5 11/14/2014   PLT 235 07/12/2014   GLUCOSE 156* 07/12/2014   CHOL 192 07/14/2013   TRIG 189* 07/14/2013   HDL 52 07/14/2013   LDLCALC 102* 07/14/2013   ALT 33* 07/14/2013   AST 29 07/14/2013   NA 140 07/12/2014   K 3.4* 07/12/2014   CL 104 07/12/2014   CREATININE 0.63 07/12/2014   BUN 14 07/12/2014   CO2 27 07/12/2014   TSH 0.719 07/29/2013   INR 1.9* 05/22/2007   HGBA1C 7.0 11/14/2014    US Venous Img Lower Unilateral Left  07/13/2014   CLINICAL DATA:  Edema  EXAM: LEFT LOWER EXTREMITY VENOUS DUPLEX ULTRASOUND  TECHNIQUE: Doppler venous assessment of the left lower extremity deep venous system was performed, including characterization of spectral flow, compressibility, and phasicity.  COMPARISON:  None.  FINDINGS: There is complete compressibility of the left common femoral, femoral, and popliteal veins. Doppler analysis demonstrates respiratory phasicity and augmentation of flow upon calf compression. No evidence of superficial vein or calf vein thrombosis.  IMPRESSION: No evidence  of left lower extremity DVT.   Electronically Signed   By: Marybelle Killings M.D.   On: 07/13/2014 14:12    Assessment & Plan:   Ridhi was seen today for diabetes, hypertension, hyperlipidemia and gastrophageal reflux.  Diagnoses and all orders for this visit:  Urinary tract infection without hematuria, site unspecified Orders: -     POCT UA - Microscopic Only -     POCT urinalysis dipstick  Vitamin D deficiency Orders: -     Vit D  25 hydroxy (rtn osteoporosis monitoring)  Hyperlipemia Orders: -     CMP14+EGFR -     Lipid panel  Essential hypertension Orders: -     POCT CBC  Screening Orders: -     Thyroid Panel With TSH  Diabetic polyneuropathy  associated with type 2 diabetes mellitus Orders: -     POCT glycosylated hemoglobin (Hb A1C) -     CMP14+EGFR -     gabapentin (NEURONTIN) 300 MG capsule; 1 daily at bedtime 1 week, then 2 daily at bedtime 1 week then 3 at bedtime 1 week then 4 at bedtime  Edema Orders: -     Brain natriuretic peptide  Other orders -     triamterene-hydrochlorothiazide (MAXZIDE-25) 37.5-25 MG per tablet; Take 0.5 tablets by mouth daily. For swelling. May use 1 tablet daily for increased swelling.   I have discontinued [name]'s onetouch ultrasoft, B-D ULTRAFINE III SHORT PEN, glucose blood, traMADol, gabapentin, nitrofurantoin (macrocrystal-monohydrate), and amoxicillin-clavulanate. I am also having her start on gabapentin and triamterene-hydrochlorothiazide. Additionally, I am having her maintain her Aspirin-Caffeine (BAYER BACK & BODY PAIN EX ST PO), Calcium Carbonate-Vit D-Min (CALCIUM 1200 PO), MAGNESIUM-ZINC PO, metFORMIN, diltiazem, omeprazole, diazepam, ciprofloxacin, meloxicam, pravastatin, HYDROcodone-acetaminophen, LANTUS SOLOSTAR, and amLODipine.  Meds ordered this encounter  Medications  . gabapentin (NEURONTIN) 300 MG capsule    Sig: 1 daily at bedtime 1 week, then 2 daily at bedtime 1 week then 3 at bedtime 1 week then 4 at bedtime    Dispense:  120 capsule    Refill:  3  . triamterene-hydrochlorothiazide (MAXZIDE-25) 37.5-25 MG per tablet    Sig: Take 0.5 tablets by mouth daily. For swelling. May use 1 tablet daily for increased swelling.    Dispense:  30 tablet    Refill:  5     Follow-up: Return in about 3 months (around 02/14/2015).  Claretta Fraise, M.D.

## 2014-11-15 ENCOUNTER — Telehealth: Payer: Self-pay | Admitting: *Deleted

## 2014-11-15 LAB — LIPID PANEL
CHOL/HDL RATIO: 3.6 ratio (ref 0.0–4.4)
CHOLESTEROL TOTAL: 171 mg/dL (ref 100–199)
HDL: 48 mg/dL (ref >39)
LDL CALC: 100 mg/dL — AB (ref 0–99)
Triglycerides: 116 mg/dL (ref 0–149)
VLDL Cholesterol Cal: 23 mg/dL (ref 5–40)

## 2014-11-15 LAB — CMP14+EGFR
A/G RATIO: 1.6 (ref 1.1–2.5)
ALK PHOS: 70 IU/L (ref 39–117)
ALT: 33 IU/L — AB (ref 0–32)
AST: 25 IU/L (ref 0–40)
Albumin: 4.2 g/dL (ref 3.5–4.7)
BILIRUBIN TOTAL: 0.4 mg/dL (ref 0.0–1.2)
BUN/Creatinine Ratio: 18 (ref 11–26)
BUN: 17 mg/dL (ref 8–27)
CO2: 20 mmol/L (ref 18–29)
Calcium: 9.3 mg/dL (ref 8.7–10.3)
Chloride: 103 mmol/L (ref 97–108)
Creatinine, Ser: 0.93 mg/dL (ref 0.57–1.00)
GFR calc non Af Amer: 56 mL/min/{1.73_m2} — ABNORMAL LOW (ref >59)
GFR, EST AFRICAN AMERICAN: 64 mL/min/{1.73_m2} (ref >59)
GLOBULIN, TOTAL: 2.7 g/dL (ref 1.5–4.5)
Glucose: 129 mg/dL — ABNORMAL HIGH (ref 65–99)
Potassium: 4.1 mmol/L (ref 3.5–5.2)
SODIUM: 144 mmol/L (ref 134–144)
Total Protein: 6.9 g/dL (ref 6.0–8.5)

## 2014-11-15 LAB — THYROID PANEL WITH TSH
FREE THYROXINE INDEX: 1.8 (ref 1.2–4.9)
T3 UPTAKE RATIO: 27 % (ref 24–39)
T4, Total: 6.5 ug/dL (ref 4.5–12.0)
TSH: 1.5 u[IU]/mL (ref 0.450–4.500)

## 2014-11-15 LAB — VITAMIN D 25 HYDROXY (VIT D DEFICIENCY, FRACTURES): VIT D 25 HYDROXY: 28.4 ng/mL — AB (ref 30.0–100.0)

## 2014-11-15 MED ORDER — GABAPENTIN 100 MG PO CAPS: 100.0000 mg | ORAL_CAPSULE | Freq: Every day | ORAL | Status: DC

## 2014-11-15 NOTE — Telephone Encounter (Signed)
I agree. Have her do that for 3-5 nights before increasing.

## 2014-11-15 NOTE — Telephone Encounter (Signed)
Pt called in stating Gabapentin caused hangover effect this AM Would like to try Gabapentin 100mg  2 tablets at night  Please advise

## 2014-11-15 NOTE — Telephone Encounter (Signed)
Pt notified of Rx Verbalizes understnding

## 2014-11-16 ENCOUNTER — Other Ambulatory Visit: Payer: Self-pay | Admitting: Family Medicine

## 2014-11-16 LAB — BRAIN NATRIURETIC PEPTIDE: BNP: 105 pg/mL — ABNORMAL HIGH (ref 0.0–100.0)

## 2014-11-16 MED ORDER — VITAMIN D (ERGOCALCIFEROL) 1.25 MG (50000 UNIT) PO CAPS: 50000.0000 [IU] | ORAL_CAPSULE | ORAL | Status: DC

## 2014-11-24 ENCOUNTER — Other Ambulatory Visit (INDEPENDENT_AMBULATORY_CARE_PROVIDER_SITE_OTHER): Payer: PPO

## 2014-11-24 DIAGNOSIS — R300 Dysuria: Secondary | ICD-10-CM

## 2014-11-24 LAB — POCT UA - MICROSCOPIC ONLY
Casts, Ur, LPF, POC: NEGATIVE
Crystals, Ur, HPF, POC: NEGATIVE
Mucus, UA: NEGATIVE
RBC, urine, microscopic: NEGATIVE
YEAST UA: NEGATIVE

## 2014-11-24 LAB — POCT URINALYSIS DIPSTICK
BILIRUBIN UA: NEGATIVE
Blood, UA: NEGATIVE
Glucose, UA: NEGATIVE
Ketones, UA: NEGATIVE
Nitrite, UA: NEGATIVE
PROTEIN UA: NEGATIVE
Spec Grav, UA: 1.01
Urobilinogen, UA: NEGATIVE
pH, UA: 7

## 2014-11-24 NOTE — Progress Notes (Signed)
Lab only 

## 2014-11-24 NOTE — Addendum Note (Signed)
Addended by: Earlene Plater on: 11/24/2014 04:42 PM   Modules accepted: Orders

## 2014-11-27 ENCOUNTER — Other Ambulatory Visit: Payer: Self-pay | Admitting: Family Medicine

## 2014-11-27 LAB — URINE CULTURE

## 2014-11-27 MED ORDER — CEFUROXIME AXETIL 250 MG PO TABS: 250.0000 mg | ORAL_TABLET | Freq: Two times a day (BID) | ORAL | Status: DC

## 2014-11-29 ENCOUNTER — Telehealth: Payer: Self-pay | Admitting: Family Medicine

## 2014-11-29 NOTE — Telephone Encounter (Signed)
Patient is having severe nausea, and dizziness since starting taking ceftin and she states she is unable to tolerate can we please switch in antibiotic. Can you please review since Stacks is off today. Please review urine culture. Please route back to pool a

## 2014-11-29 NOTE — Telephone Encounter (Signed)
Patient is splitting the tablet and taking it 4 x a day with meals and this has seemed to help her nausea today.

## 2014-11-29 NOTE — Telephone Encounter (Signed)
The problem is, there are 2 species in the urine culture and to cover both of them if we do not use Ceftin she will need to be on 2 different antibiotic's. So we could treat nausea and dizziness with some Phenergan or Zofran and continue Ceftin or used to anabiotic's. Which which she prefer

## 2014-12-11 ENCOUNTER — Telehealth: Payer: Self-pay | Admitting: Family Medicine

## 2014-12-11 DIAGNOSIS — N390 Urinary tract infection, site not specified: Secondary | ICD-10-CM

## 2014-12-11 NOTE — Telephone Encounter (Signed)
Spoke with pt regarding UTI Finished antibiotic on Friday Slight dysuria Friday PM but sxs have resolved Pt will bring in urine for repeat UA and culture Will come in for OV if sxs worsen Order entered in [employer]

## 2014-12-12 ENCOUNTER — Other Ambulatory Visit (INDEPENDENT_AMBULATORY_CARE_PROVIDER_SITE_OTHER): Payer: PPO

## 2014-12-12 DIAGNOSIS — N390 Urinary tract infection, site not specified: Secondary | ICD-10-CM | POA: Diagnosis not present

## 2014-12-12 LAB — POCT URINALYSIS DIPSTICK
Glucose, UA: NEGATIVE
KETONES UA: NEGATIVE
Nitrite, UA: NEGATIVE
PH UA: 6
RBC UA: NEGATIVE
SPEC GRAV UA: 1.02
Urobilinogen, UA: NEGATIVE

## 2014-12-12 LAB — POCT UA - MICROSCOPIC ONLY
Bacteria, U Microscopic: NEGATIVE
CASTS, UR, LPF, POC: NEGATIVE
Crystals, Ur, HPF, POC: NEGATIVE
Yeast, UA: NEGATIVE

## 2014-12-12 NOTE — Addendum Note (Signed)
Addended by: Selmer Dominion on: 12/12/2014 12:09 PM   Modules accepted: Orders

## 2014-12-12 NOTE — Progress Notes (Signed)
lmtcb

## 2014-12-12 NOTE — Progress Notes (Signed)
Lab only 

## 2014-12-14 ENCOUNTER — Other Ambulatory Visit: Payer: Self-pay | Admitting: Family

## 2014-12-14 LAB — URINE CULTURE

## 2014-12-15 ENCOUNTER — Telehealth: Payer: Self-pay | Admitting: Family Medicine

## 2014-12-15 MED ORDER — CIPROFLOXACIN HCL 500 MG PO TABS: 500.0000 mg | ORAL_TABLET | Freq: Two times a day (BID) | ORAL | Status: DC

## 2014-12-15 MED ORDER — AMOXICILLIN-POT CLAVULANATE 875-125 MG PO TABS: 1.0000 | ORAL_TABLET | Freq: Two times a day (BID) | ORAL | Status: DC

## 2014-12-15 NOTE — Telephone Encounter (Signed)
The patient's infection showed 2 different germs on the last culture. That makes it very hard to treat. I would like for her to try to take to antibiotic's this time. Each antibiotic should have maximum impact on the individual germ it is meant to cover. If this does not clear it I agree that she will be best served by seeing a urologist.

## 2014-12-15 NOTE — Telephone Encounter (Signed)
Patient aware and verbalizes understanding. 

## 2014-12-15 NOTE — Telephone Encounter (Signed)
Patient states that her urine is extremely cloud and is having a hard time urinating. Please advise. Patient is getting really frustrated that she can not get this infection cleared up. What do you think about her going back on a daily medication. She states that she was on a daily cipro in the past. Or about a referral back to urologist.

## 2014-12-20 ENCOUNTER — Telehealth: Payer: Self-pay | Admitting: *Deleted

## 2014-12-20 DIAGNOSIS — N390 Urinary tract infection, site not specified: Secondary | ICD-10-CM

## 2014-12-20 NOTE — Telephone Encounter (Signed)
If her symptoms are gone she could discontinue the antibiotic. However, since she has had so much trouble, if willing, I would still like for her to see urology.

## 2014-12-20 NOTE — Telephone Encounter (Signed)
Pt called concerning letter she received regarding urine culture She is currently taking 2 different antibiotics for UTI Letter stated culture was clear Does she need to finish antibiotics Does she need to see urology Please advise

## 2014-12-21 NOTE — Telephone Encounter (Signed)
Pt notified of Dr Artemio Aly recommendation Verbalizes understanding Referral entered in [facility]

## 2014-12-25 ENCOUNTER — Other Ambulatory Visit: Payer: Self-pay | Admitting: Family Medicine

## 2015-01-01 ENCOUNTER — Telehealth: Payer: Self-pay | Admitting: Family Medicine

## 2015-01-01 DIAGNOSIS — N390 Urinary tract infection, site not specified: Secondary | ICD-10-CM

## 2015-01-01 NOTE — Telephone Encounter (Signed)
Spoke with pt regarding uti She is much better She will finish antibiotics and bring in repeat urine for UA and culture

## 2015-01-02 ENCOUNTER — Other Ambulatory Visit: Payer: Self-pay

## 2015-01-02 MED ORDER — METFORMIN HCL 500 MG PO TABS: 500.0000 mg | ORAL_TABLET | Freq: Every day | ORAL | Status: DC

## 2015-01-03 ENCOUNTER — Other Ambulatory Visit: Payer: Self-pay | Admitting: Family Medicine

## 2015-01-17 ENCOUNTER — Telehealth: Payer: Self-pay | Admitting: Family Medicine

## 2015-01-17 NOTE — Telephone Encounter (Signed)
Called and informed patient that she may come in tomorrow to leave a urine specimen

## 2015-01-18 ENCOUNTER — Other Ambulatory Visit (INDEPENDENT_AMBULATORY_CARE_PROVIDER_SITE_OTHER): Payer: PPO

## 2015-01-18 DIAGNOSIS — N390 Urinary tract infection, site not specified: Secondary | ICD-10-CM | POA: Diagnosis not present

## 2015-01-18 LAB — POCT UA - MICROSCOPIC ONLY
Bacteria, U Microscopic: NEGATIVE
Casts, Ur, LPF, POC: NEGATIVE
MUCUS UA: NEGATIVE
RBC, URINE, MICROSCOPIC: NEGATIVE
Yeast, UA: NEGATIVE

## 2015-01-18 LAB — POCT URINALYSIS DIPSTICK
BILIRUBIN UA: NEGATIVE
Glucose, UA: NEGATIVE
Ketones, UA: NEGATIVE
Nitrite, UA: NEGATIVE
PH UA: 6
Protein, UA: NEGATIVE
RBC UA: NEGATIVE
Spec Grav, UA: 1.015
UROBILINOGEN UA: NEGATIVE

## 2015-01-20 LAB — URINE CULTURE

## 2015-01-22 ENCOUNTER — Other Ambulatory Visit: Payer: Self-pay | Admitting: Family Medicine

## 2015-01-22 MED ORDER — AMOXICILLIN-POT CLAVULANATE 875-125 MG PO TABS: 1.0000 | ORAL_TABLET | Freq: Two times a day (BID) | ORAL | Status: DC

## 2015-02-02 ENCOUNTER — Telehealth: Payer: Self-pay | Admitting: *Deleted

## 2015-02-02 DIAGNOSIS — N390 Urinary tract infection, site not specified: Secondary | ICD-10-CM

## 2015-02-02 NOTE — Telephone Encounter (Signed)
Pt notified to repeat urinalysis after finishing antibiotic

## 2015-02-06 ENCOUNTER — Other Ambulatory Visit (INDEPENDENT_AMBULATORY_CARE_PROVIDER_SITE_OTHER): Payer: PPO

## 2015-02-06 ENCOUNTER — Ambulatory Visit (INDEPENDENT_AMBULATORY_CARE_PROVIDER_SITE_OTHER): Payer: PPO

## 2015-02-06 DIAGNOSIS — N390 Urinary tract infection, site not specified: Secondary | ICD-10-CM | POA: Diagnosis not present

## 2015-02-06 DIAGNOSIS — Z23 Encounter for immunization: Secondary | ICD-10-CM | POA: Diagnosis not present

## 2015-02-06 LAB — POCT UA - MICROSCOPIC ONLY
Bacteria, U Microscopic: NEGATIVE
Casts, Ur, LPF, POC: NEGATIVE
Crystals, Ur, HPF, POC: NEGATIVE
Mucus, UA: NEGATIVE
RBC, urine, microscopic: NEGATIVE
Yeast, UA: NEGATIVE

## 2015-02-06 LAB — POCT URINALYSIS DIPSTICK
Bilirubin, UA: NEGATIVE
Blood, UA: NEGATIVE
GLUCOSE UA: NEGATIVE
Ketones, UA: NEGATIVE
NITRITE UA: NEGATIVE
PROTEIN UA: NEGATIVE
Spec Grav, UA: 1.01
UROBILINOGEN UA: NEGATIVE
pH, UA: 6

## 2015-02-06 NOTE — Progress Notes (Signed)
Lab only 

## 2015-02-07 ENCOUNTER — Ambulatory Visit (INDEPENDENT_AMBULATORY_CARE_PROVIDER_SITE_OTHER): Payer: PPO | Admitting: Pediatrics

## 2015-02-07 ENCOUNTER — Ambulatory Visit (INDEPENDENT_AMBULATORY_CARE_PROVIDER_SITE_OTHER): Payer: PPO

## 2015-02-07 ENCOUNTER — Encounter: Payer: Self-pay | Admitting: Pediatrics

## 2015-02-07 VITALS — BP 155/79 | HR 76 | Temp 97.4°F | Ht 59.0 in | Wt 159.8 lb

## 2015-02-07 DIAGNOSIS — M25532 Pain in left wrist: Secondary | ICD-10-CM

## 2015-02-07 DIAGNOSIS — I10 Essential (primary) hypertension: Secondary | ICD-10-CM | POA: Diagnosis not present

## 2015-02-07 DIAGNOSIS — R030 Elevated blood-pressure reading, without diagnosis of hypertension: Secondary | ICD-10-CM

## 2015-02-07 DIAGNOSIS — S63502A Unspecified sprain of left wrist, initial encounter: Secondary | ICD-10-CM | POA: Diagnosis not present

## 2015-02-07 DIAGNOSIS — IMO0001 Reserved for inherently not codable concepts without codable children: Secondary | ICD-10-CM

## 2015-02-07 NOTE — Patient Instructions (Addendum)
--  try ibuprofen 400mg  up to twice a day for three days --wrist splint

## 2015-02-07 NOTE — Progress Notes (Signed)
Subjective:    Patient ID: [name], female    DOB: 1925-10-17, 81 y.o.   MRN: [id number]  CC: wrist pain  HPI: [name] is a 81 y.o. female presenting on 02/07/2015 for Arm Pain  Wrist pain/lower arm pain in L arm ongoing for the last two days.  First noticed it when hyperextending her wrist with pressure to push herself up in bed. HAs been hurting/aching ever since. Can do very little with hand/arm now as she cannot supinate her wrist, prefers to hold it palm down, wrist neutral, elbow bent, in fron tof her. Does not hurt when like this. Has sharp shooting pain with supination, wrist extension or flexion. Has not noticed redness, bruising, or swelling in wrist. Takes gabapentin for neuropathy. Takes aspirin as needed for aches and pains. Has h/o OA in multiple joints, has never had this problem with wrist before. Broke both of her wrists many years ago and per pt they were set poorly and didn't heal straight. No fevers. No other joints bothering her.   Relevant past medical, surgical, family and social history reviewed and updated as indicated. Interim medical history since our last visit reviewed. Allergies and medications reviewed and updated.  ROS: Per HPI unless specifically indicated above  Past Medical History Patient Active Problem List   Diagnosis Date Noted  . Diabetic polyneuropathy associated with type 2 diabetes mellitus ([place]) 11/14/2014  . Neck pain   . HLD (hyperlipidemia) 02/11/2013  . Fibromyalgia 02/11/2013  . Arthritis 11/09/2012  . CVA 08/01/2008  . OSTEOARTHRITIS 04/09/2007  . HYPERLIPIDEMIA 03/08/2007  . Essential hypertension 03/08/2007  . PULMONARY FIBROSIS, POSTINFLAMMATORY 03/08/2007  . GERD 03/08/2007  . BREAST CANCER, HX OF 03/08/2007    Current Outpatient Prescriptions  Medication Sig Dispense Refill  . amLODipine (NORVASC) 5 MG tablet Take 0.5 tablets (2.5 mg total) by mouth daily. 90 tablet 0  . Aspirin-Caffeine (BAYER BACK &  BODY PAIN EX ST PO) Take 1 tablet by mouth daily as needed (pain).     . Calcium Carbonate-Vit D-Min (CALCIUM 1200 PO) Take 1 tablet by mouth every other day.     . diltiazem (CARDIZEM) 120 MG tablet TAKE ONE TABLET BY MOUTH ONCE DAILY 60 tablet 4  . gabapentin (NEURONTIN) 100 MG capsule TAKE ONE CAPSULE BY MOUTH ONCE DAILY AT BEDTIME INCREASE  AS  TOLERATED  TO  3  CAPSULES  AT  BEDTIME 90 capsule 1  . gabapentin (NEURONTIN) 300 MG capsule 1 daily at bedtime 1 week, then 2 daily at bedtime 1 week then 3 at bedtime 1 week then 4 at bedtime 120 capsule 3  . LANTUS SOLOSTAR 100 UNIT/ML Solostar Pen INJECT 30 UNITS SUBCUTANEOUSLY ONCE DAILY AT 10PM 15 pen 1  . MAGNESIUM-ZINC PO Take 15 mg by mouth 3 (three) times daily.    . meloxicam (MOBIC) 7.5 MG tablet Take 1 tablet (7.5 mg total) by mouth daily. 30 tablet 2  . metFORMIN (GLUCOPHAGE) 500 MG tablet Take 1 tablet (500 mg total) by mouth daily with breakfast. 90 tablet 0  . omeprazole (PRILOSEC) 20 MG capsule TAKE ONE CAPSULE BY MOUTH TWICE DAILY BEFORE MEAL(S) 180 capsule 1  . pravastatin (PRAVACHOL) 40 MG tablet TAKE ONE TABLET BY MOUTH ONCE DAILY 90 tablet 1  . triamterene-hydrochlorothiazide (MAXZIDE-25) 37.5-25 MG per tablet Take 0.5 tablets by mouth daily. For swelling. May use 1 tablet daily for increased swelling. 30 tablet 5  . Vitamin D, Ergocalciferol, (DRISDOL) 50000 UNITS CAPS capsule  Take 1 capsule (50,000 Units total) by mouth 2 (two) times a week. 16 capsule 0  . diazepam (VALIUM) 5 MG tablet Take 1 tablet 1 hour before MRI and 1 daily as needed (Patient not taking: Reported on 02/07/2015) 12 tablet 0   No current facility-administered medications for this visit.       Objective:    BP 155/79 mmHg  Pulse 76  Temp(Src) 97.4 F (36.3 C) (Oral)  Ht 4\' 11"  (1.499 m)  Wt 159 lb 12.8 oz (72.485 kg)  BMI 32.26 kg/m2  Wt Readings from Last 3 Encounters:  02/07/15 159 lb 12.8 oz (72.485 kg)  11/14/14 162 lb 3.2 oz (73.573 kg)    11/01/14 161 lb (73.029 kg)    Gen: NAD, alert, cooperative with exam, NCAT EYES:  no scleral injection or icterus CV: NRRR, normal S1/S2, no murmur, DP pulses 2+ b/l Resp: CTABL, no wheezes, normal WOB Ext: No edema, warm Neuro: Alert and oriented, strength equal b/l UE and LE, coodrination grossly normal MSK: Inspection with deformity at radial head L and R sides, no visible erythema or swelling. Pain with L wrist ROM, not able to flex or ext wrist against pressure due to pain. Pain with supination. Points to mid forearm down as location of pain.  No pain with R wrist ROM.  Point tenderness over extensor surface medial L wrist. No pain in L snuff box.  Wrist xray: Images were personally reviewed. Final report:  "There are changes consistent with prior distal radial and ulnar fractures with healing. A well corticated bony densities noted adjacent to the distal ulna medially consistent with an unfused fragment. Well corticated fragments are also noted adjacent to the distal aspect of the scaphoid also likely posttraumatic in nature. Degenerative changes of the first [facility] joint are seen. Widening of the scapholunate space is noted likely related to prior ligamentous damage. No acute fracture is seen.  IMPRESSION: Chronic changes without acute abnormality"     Assessment & Plan:   [name] was seen today for wrist pain, xray with no acute fracture. No known trauma. Recommend keep wrist in splint during the day for comfort, can try low dose ibuprofen BID for next three days. BP elevated today, she has been taking her home BP meds, says it has not been this high at home. Continue home BP meds. Will let us know if regularly elevated at home.  Diagnoses and all orders for this visit:  Elevated blood pressure  Essential hypertension  Left wrist sprain, initial encounter   Follow up plan: 2 weeks if wrist not improved  Assunta Found, MD [facility] 02/07/2015,  11:14 AM

## 2015-02-08 LAB — URINE CULTURE

## 2015-02-16 ENCOUNTER — Other Ambulatory Visit: Payer: Self-pay | Admitting: *Deleted

## 2015-02-16 MED ORDER — AMLODIPINE BESYLATE 5 MG PO TABS: 2.5000 mg | ORAL_TABLET | Freq: Every day | ORAL | Status: DC

## 2015-02-16 NOTE — Progress Notes (Signed)
Pharmacy requesting refill of Amlodipine Refill sent into [facility] as requested Okayed per Dr Livia Snellen

## 2015-02-19 ENCOUNTER — Ambulatory Visit (INDEPENDENT_AMBULATORY_CARE_PROVIDER_SITE_OTHER): Payer: PPO | Admitting: Family Medicine

## 2015-02-19 ENCOUNTER — Encounter: Payer: Self-pay | Admitting: Family Medicine

## 2015-02-19 VITALS — BP 147/74 | HR 81 | Temp 97.8°F | Ht 59.0 in | Wt 161.0 lb

## 2015-02-19 DIAGNOSIS — R300 Dysuria: Secondary | ICD-10-CM | POA: Diagnosis not present

## 2015-02-19 LAB — POCT UA - MICROSCOPIC ONLY
CRYSTALS, UR, HPF, POC: NEGATIVE
Casts, Ur, LPF, POC: NEGATIVE
Mucus, UA: NEGATIVE
YEAST UA: NEGATIVE

## 2015-02-19 LAB — POCT URINALYSIS DIPSTICK
Bilirubin, UA: NEGATIVE
Glucose, UA: NEGATIVE
KETONES UA: NEGATIVE
Nitrite, UA: NEGATIVE
PH UA: 8
PROTEIN UA: NEGATIVE
UROBILINOGEN UA: NEGATIVE

## 2015-02-19 MED ORDER — CEFUROXIME AXETIL 500 MG PO TABS: 500.0000 mg | ORAL_TABLET | Freq: Two times a day (BID) | ORAL | Status: DC

## 2015-02-19 NOTE — Progress Notes (Signed)
Subjective:  Patient ID: [name], female    DOB: 1925-07-26  Age: 81 y.o. MRN: [id number]  CC: Urinary Tract Infection   HPI MALLERIE BLOK presents for "I feel like I spend 1/2 my day urinating, but just pass a few drops.Having urgency. Right flank hurts. Seeing urology in 1 week. Dr. Exie Parody in [place].  History Anaisabel has a past medical history of Diabetes mellitus without complication ([place]); Hyperlipidemia; Hypertension; Frequent UTI; and Neck pain.   She has past surgical history that includes Appendectomy; Cholecystectomy; Tonsillectomy; and Joint replacement.   Her family history includes Cancer in her brother, father, and mother.She reports that she has never smoked. She has never used smokeless tobacco. She reports that she does not drink alcohol or use illicit drugs.  Outpatient Prescriptions Prior to Visit  Medication Sig Dispense Refill  . amLODipine (NORVASC) 5 MG tablet Take 0.5 tablets (2.5 mg total) by mouth daily. 90 tablet 0  . Aspirin-Caffeine (BAYER BACK & BODY PAIN EX ST PO) Take 1 tablet by mouth daily as needed (pain).     . Calcium Carbonate-Vit D-Min (CALCIUM 1200 PO) Take 1 tablet by mouth every other day.     . diltiazem (CARDIZEM) 120 MG tablet TAKE ONE TABLET BY MOUTH ONCE DAILY 60 tablet 4  . gabapentin (NEURONTIN) 100 MG capsule TAKE ONE CAPSULE BY MOUTH ONCE DAILY AT BEDTIME INCREASE  AS  TOLERATED  TO  3  CAPSULES  AT  BEDTIME 90 capsule 1  . gabapentin (NEURONTIN) 300 MG capsule 1 daily at bedtime 1 week, then 2 daily at bedtime 1 week then 3 at bedtime 1 week then 4 at bedtime 120 capsule 3  . LANTUS SOLOSTAR 100 UNIT/ML Solostar Pen INJECT 30 UNITS SUBCUTANEOUSLY ONCE DAILY AT 10PM 15 pen 1  . MAGNESIUM-ZINC PO Take 15 mg by mouth 3 (three) times daily.    . meloxicam (MOBIC) 7.5 MG tablet Take 1 tablet (7.5 mg total) by mouth daily. 30 tablet 2  . metFORMIN (GLUCOPHAGE) 500 MG tablet Take 1 tablet (500 mg total) by mouth daily with breakfast. 90  tablet 0  . omeprazole (PRILOSEC) 20 MG capsule TAKE ONE CAPSULE BY MOUTH TWICE DAILY BEFORE MEAL(S) 180 capsule 1  . pravastatin (PRAVACHOL) 40 MG tablet TAKE ONE TABLET BY MOUTH ONCE DAILY 90 tablet 1  . triamterene-hydrochlorothiazide (MAXZIDE-25) 37.5-25 MG per tablet Take 0.5 tablets by mouth daily. For swelling. May use 1 tablet daily for increased swelling. 30 tablet 5  . diazepam (VALIUM) 5 MG tablet Take 1 tablet 1 hour before MRI and 1 daily as needed (Patient not taking: Reported on 02/19/2015) 12 tablet 0  . Vitamin D, Ergocalciferol, (DRISDOL) 50000 UNITS CAPS capsule Take 1 capsule (50,000 Units total) by mouth 2 (two) times a week. (Patient not taking: Reported on 02/19/2015) 16 capsule 0   No facility-administered medications prior to visit.    ROS Review of Systems  Constitutional: Negative for fever, chills and diaphoresis.  HENT: Negative for congestion.   Eyes: Negative for visual disturbance.  Respiratory: Negative for cough and shortness of breath.   Cardiovascular: Negative for chest pain and palpitations.  Gastrointestinal: Negative for nausea, diarrhea and constipation.  Genitourinary: Positive for dysuria, urgency and frequency. Negative for hematuria, flank pain, decreased urine volume, menstrual problem and pelvic pain.  Musculoskeletal: Negative for joint swelling and arthralgias.  Skin: Negative for rash.  Neurological: Negative for dizziness and numbness.    Objective:  BP 147/74 mmHg  Pulse  81  Temp(Src) 97.8 F (36.6 C) (Oral)  Ht 4\' 11"  (1.499 m)  Wt 161 lb (73.029 kg)  BMI 32.50 kg/m2  BP Readings from Last 3 Encounters:  02/19/15 147/74  02/07/15 155/79  11/14/14 141/78    Wt Readings from Last 3 Encounters:  02/19/15 161 lb (73.029 kg)  02/07/15 159 lb 12.8 oz (72.485 kg)  11/14/14 162 lb 3.2 oz (73.573 kg)     Physical Exam  Constitutional: She is oriented to person, place, and time. She appears well-developed and well-nourished.    HENT:  Head: Normocephalic and atraumatic.  Cardiovascular: Normal rate and regular rhythm.   No murmur heard. Pulmonary/Chest: Effort normal and breath sounds normal.  Abdominal: Soft. Bowel sounds are normal. She exhibits no mass. There is tenderness. There is no rebound and no guarding.  Neurological: She is alert and oriented to person, place, and time.  Skin: Skin is warm and dry.  Psychiatric: She has a normal mood and affect. Her behavior is normal.    Lab Results  Component Value Date   HGBA1C 7.0 11/14/2014   HGBA1C 6.2 01/06/2014   HGBA1C 6.5% 07/14/2013    Lab Results  Component Value Date   WBC 7.1 11/14/2014   HGB 13.2 11/14/2014   HCT 41.5 11/14/2014   PLT 235 07/12/2014   GLUCOSE 129* 11/14/2014   CHOL 171 11/14/2014   TRIG 116 11/14/2014   HDL 48 11/14/2014   LDLCALC 100* 11/14/2014   ALT 33* 11/14/2014   AST 25 11/14/2014   NA 144 11/14/2014   K 4.1 11/14/2014   CL 103 11/14/2014   CREATININE 0.93 11/14/2014   BUN 17 11/14/2014   CO2 20 11/14/2014   TSH 1.500 11/14/2014   INR 1.9* 05/22/2007   HGBA1C 7.0 11/14/2014    US Venous Img Lower Unilateral Left  07/13/2014  CLINICAL DATA:  Edema EXAM: LEFT LOWER EXTREMITY VENOUS DUPLEX ULTRASOUND TECHNIQUE: Doppler venous assessment of the left lower extremity deep venous system was performed, including characterization of spectral flow, compressibility, and phasicity. COMPARISON:  None. FINDINGS: There is complete compressibility of the left common femoral, femoral, and popliteal veins. Doppler analysis demonstrates respiratory phasicity and augmentation of flow upon calf compression. No evidence of superficial vein or calf vein thrombosis. IMPRESSION: No evidence of left lower extremity DVT. Electronically Signed   By: Marybelle Killings M.D.   On: 07/13/2014 14:12    Assessment & Plan:   Melicia was seen today for urinary tract infection.  Diagnoses and all orders for this visit:  Dysuria -     POCT UA -  Microscopic Only -     POCT urinalysis dipstick -     Urine culture  Other orders -     cefUROXime (CEFTIN) 500 MG tablet; Take 1 tablet (500 mg total) by mouth 2 (two) times daily with a meal.   I have discontinued Ms. Siracusa's diazepam and Vitamin D (Ergocalciferol). I am also having her start on cefUROXime. Additionally, I am having her maintain her Aspirin-Caffeine (BAYER BACK & BODY PAIN EX ST PO), Calcium Carbonate-Vit D-Min (CALCIUM 1200 PO), MAGNESIUM-ZINC PO, omeprazole, meloxicam, LANTUS SOLOSTAR, gabapentin, triamterene-hydrochlorothiazide, diltiazem, pravastatin, metFORMIN, gabapentin, and amLODipine.  Meds ordered this encounter  Medications  . cefUROXime (CEFTIN) 500 MG tablet    Sig: Take 1 tablet (500 mg total) by mouth 2 (two) times daily with a meal.    Dispense:  30 tablet    Refill:  0     Follow-up: Return if  symptoms worsen or fail to improve.  Claretta Fraise, M.D.

## 2015-02-21 LAB — URINE CULTURE

## 2015-03-08 ENCOUNTER — Telehealth: Payer: Self-pay | Admitting: Family Medicine

## 2015-03-08 DIAGNOSIS — R300 Dysuria: Secondary | ICD-10-CM

## 2015-03-08 NOTE — Telephone Encounter (Signed)
Pt finished antibiotic on Monday sxs have returned She will bring in specimen for urinalysis Order entered in Epic

## 2015-03-09 ENCOUNTER — Other Ambulatory Visit (INDEPENDENT_AMBULATORY_CARE_PROVIDER_SITE_OTHER): Payer: PPO

## 2015-03-09 DIAGNOSIS — N390 Urinary tract infection, site not specified: Secondary | ICD-10-CM | POA: Diagnosis not present

## 2015-03-09 LAB — POCT URINALYSIS DIPSTICK
BILIRUBIN UA: NEGATIVE
GLUCOSE UA: NEGATIVE
KETONES UA: NEGATIVE
NITRITE UA: NEGATIVE
PH UA: 7
Protein, UA: NEGATIVE
SPEC GRAV UA: 1.01
Urobilinogen, UA: NEGATIVE

## 2015-03-09 LAB — POCT UA - MICROSCOPIC ONLY
CRYSTALS, UR, HPF, POC: NEGATIVE
Casts, Ur, LPF, POC: NEGATIVE
MUCUS UA: NEGATIVE
Yeast, UA: NEGATIVE

## 2015-03-09 NOTE — Progress Notes (Signed)
Lab only 

## 2015-03-13 LAB — URINE CULTURE

## 2015-03-14 ENCOUNTER — Other Ambulatory Visit (INDEPENDENT_AMBULATORY_CARE_PROVIDER_SITE_OTHER): Payer: PPO

## 2015-03-14 DIAGNOSIS — R300 Dysuria: Secondary | ICD-10-CM | POA: Diagnosis not present

## 2015-03-14 LAB — POCT UA - MICROSCOPIC ONLY
Casts, Ur, LPF, POC: NEGATIVE
Crystals, Ur, HPF, POC: NEGATIVE
MUCUS UA: NEGATIVE
YEAST UA: NEGATIVE

## 2015-03-14 LAB — POCT URINALYSIS DIPSTICK
Bilirubin, UA: NEGATIVE
GLUCOSE UA: NEGATIVE
KETONES UA: NEGATIVE
Nitrite, UA: NEGATIVE
Protein, UA: NEGATIVE
SPEC GRAV UA: 1.01
Urobilinogen, UA: NEGATIVE
pH, UA: 6

## 2015-03-14 NOTE — Addendum Note (Signed)
Addended by: Nigel Berthold C on: 03/14/2015 09:24 AM   Modules accepted: Orders

## 2015-03-14 NOTE — Progress Notes (Signed)
Lab only 

## 2015-03-17 ENCOUNTER — Other Ambulatory Visit: Payer: Self-pay | Admitting: Family Medicine

## 2015-03-18 ENCOUNTER — Other Ambulatory Visit: Payer: Self-pay | Admitting: Family Medicine

## 2015-03-18 LAB — URINE CULTURE

## 2015-03-18 MED ORDER — NITROFURANTOIN MONOHYD MACRO 100 MG PO CAPS: 100.0000 mg | ORAL_CAPSULE | Freq: Two times a day (BID) | ORAL | Status: DC

## 2015-03-19 ENCOUNTER — Telehealth: Payer: Self-pay | Admitting: Family Medicine

## 2015-03-19 NOTE — Telephone Encounter (Signed)
Please review

## 2015-03-19 NOTE — Telephone Encounter (Signed)
Error

## 2015-03-19 NOTE — Telephone Encounter (Signed)
Patient has an appt with Dr Exie Parody, urology in [place], tomorrow and would like culture results faxed over. This has been taken care of.

## 2015-04-04 ENCOUNTER — Telehealth: Payer: Self-pay | Admitting: Family Medicine

## 2015-04-04 ENCOUNTER — Other Ambulatory Visit: Payer: Self-pay | Admitting: Family Medicine

## 2015-04-04 NOTE — Telephone Encounter (Signed)
Pt is having some dysuria  Will call urologist and if no help will call to schedule appt here

## 2015-04-06 ENCOUNTER — Telehealth: Payer: Self-pay | Admitting: Family Medicine

## 2015-04-06 NOTE — Telephone Encounter (Signed)
Spoke with pt regarding sxs and she is much better  Will call if sxs return

## 2015-05-08 DIAGNOSIS — N390 Urinary tract infection, site not specified: Secondary | ICD-10-CM | POA: Diagnosis not present

## 2015-05-08 DIAGNOSIS — R339 Retention of urine, unspecified: Secondary | ICD-10-CM | POA: Diagnosis not present

## 2015-05-08 DIAGNOSIS — N905 Atrophy of vulva: Secondary | ICD-10-CM | POA: Diagnosis not present

## 2015-05-09 DIAGNOSIS — R339 Retention of urine, unspecified: Secondary | ICD-10-CM | POA: Diagnosis not present

## 2015-05-18 DIAGNOSIS — H2513 Age-related nuclear cataract, bilateral: Secondary | ICD-10-CM | POA: Diagnosis not present

## 2015-05-18 DIAGNOSIS — H40033 Anatomical narrow angle, bilateral: Secondary | ICD-10-CM | POA: Diagnosis not present

## 2015-05-21 ENCOUNTER — Other Ambulatory Visit: Payer: Self-pay | Admitting: Family Medicine

## 2015-05-22 ENCOUNTER — Other Ambulatory Visit: Payer: Self-pay | Admitting: Family Medicine

## 2015-06-06 DIAGNOSIS — N390 Urinary tract infection, site not specified: Secondary | ICD-10-CM | POA: Diagnosis not present

## 2015-06-06 DIAGNOSIS — R300 Dysuria: Secondary | ICD-10-CM | POA: Diagnosis not present

## 2015-06-11 DIAGNOSIS — L84 Corns and callosities: Secondary | ICD-10-CM | POA: Diagnosis not present

## 2015-06-11 DIAGNOSIS — E115 Type 2 diabetes mellitus with circulatory complications: Secondary | ICD-10-CM | POA: Diagnosis not present

## 2015-06-11 DIAGNOSIS — B351 Tinea unguium: Secondary | ICD-10-CM | POA: Diagnosis not present

## 2015-06-12 ENCOUNTER — Telehealth: Payer: Self-pay | Admitting: Family Medicine

## 2015-06-12 NOTE — Telephone Encounter (Signed)
Spoke with pt regarding bs readings She is due for check up appt scheduled

## 2015-06-12 NOTE — Telephone Encounter (Signed)
Stp and she states she can only talk with Zigmund Daniel.

## 2015-06-14 ENCOUNTER — Other Ambulatory Visit: Payer: Self-pay | Admitting: *Deleted

## 2015-06-14 MED ORDER — INSULIN GLARGINE 100 UNIT/ML SOLOSTAR PEN: PEN_INJECTOR | SUBCUTANEOUS | Status: DC

## 2015-06-14 MED ORDER — INSULIN GLARGINE 100 UNIT/ML SOLOSTAR PEN: 30.0000 [IU] | PEN_INJECTOR | Freq: Every day | SUBCUTANEOUS | Status: DC

## 2015-06-14 NOTE — Telephone Encounter (Signed)
Pt pharm - [facility] called and states the pt is having to pay $90 a month for Lantus. Dr Wendi Snipes recommended we try Basaglar to see if cost would be less - [facility] aware and will see if it will be cheaper  - directions are the same. -jhb

## 2015-07-03 ENCOUNTER — Encounter: Payer: Self-pay | Admitting: Family Medicine

## 2015-07-03 ENCOUNTER — Ambulatory Visit (INDEPENDENT_AMBULATORY_CARE_PROVIDER_SITE_OTHER): Payer: PPO | Admitting: Family Medicine

## 2015-07-03 VITALS — BP 123/68 | HR 75 | Temp 97.0°F | Ht 59.0 in | Wt 159.6 lb

## 2015-07-03 DIAGNOSIS — I10 Essential (primary) hypertension: Secondary | ICD-10-CM

## 2015-07-03 DIAGNOSIS — K219 Gastro-esophageal reflux disease without esophagitis: Secondary | ICD-10-CM

## 2015-07-03 DIAGNOSIS — E785 Hyperlipidemia, unspecified: Secondary | ICD-10-CM

## 2015-07-03 DIAGNOSIS — E1121 Type 2 diabetes mellitus with diabetic nephropathy: Secondary | ICD-10-CM | POA: Diagnosis not present

## 2015-07-03 DIAGNOSIS — Z794 Long term (current) use of insulin: Secondary | ICD-10-CM

## 2015-07-03 DIAGNOSIS — E1142 Type 2 diabetes mellitus with diabetic polyneuropathy: Secondary | ICD-10-CM | POA: Diagnosis not present

## 2015-07-03 DIAGNOSIS — N390 Urinary tract infection, site not specified: Secondary | ICD-10-CM

## 2015-07-03 LAB — POCT URINALYSIS DIPSTICK
BILIRUBIN UA: NEGATIVE
GLUCOSE UA: NEGATIVE
KETONES UA: NEGATIVE
Nitrite, UA: NEGATIVE
Spec Grav, UA: 1.015
Urobilinogen, UA: NEGATIVE
pH, UA: 5

## 2015-07-03 LAB — POCT GLYCOSYLATED HEMOGLOBIN (HGB A1C): HEMOGLOBIN A1C: 6.8

## 2015-07-03 LAB — POCT UA - MICROSCOPIC ONLY
CRYSTALS, UR, HPF, POC: NEGATIVE
Casts, Ur, LPF, POC: NEGATIVE
Mucus, UA: NEGATIVE
RBC, URINE, MICROSCOPIC: NEGATIVE
Yeast, UA: NEGATIVE

## 2015-07-03 MED ORDER — METFORMIN HCL 500 MG PO TABS: ORAL_TABLET | ORAL | Status: DC

## 2015-07-03 MED ORDER — CIPROFLOXACIN HCL 500 MG PO TABS: 500.0000 mg | ORAL_TABLET | Freq: Two times a day (BID) | ORAL | Status: DC

## 2015-07-03 MED ORDER — NITROFURANTOIN MONOHYD MACRO 100 MG PO CAPS: 100.0000 mg | ORAL_CAPSULE | Freq: Every day | ORAL | Status: DC

## 2015-07-03 NOTE — Addendum Note (Signed)
Addended by: Marin Olp on: 07/03/2015 05:15 PM   Modules accepted: Orders, Medications

## 2015-07-03 NOTE — Progress Notes (Signed)
Subjective:  Patient ID: [name], female    DOB: 27-Oct-1925  Age: 82 y.o. MRN: [id number]  CC: Hypertension; Diabetes; Urinary Tract Infection; and Hyperlipidemia   HPI [name] presents for  follow-up of hypertension. Patient has no history of headache chest pain or shortness of breath or recent cough. Patient also denies symptoms of TIA such as numbness weakness lateralizing. Patient checks  blood pressure at home and has not had any elevated readings recently. Patient denies side effects from his medication. States taking it regularly.  Patient also  in for follow-up of elevated cholesterol. Doing well without complaints on current medication. Denies side effects of statin including myalgia and arthralgia and nausea. Also in today for liver function testing. Currently no chest pain, shortness of breath or other cardiovascular related symptoms noted.  Follow-up of diabetes. Patient does check blood sugar at home. Readings run between 100 and 150 Patient denies symptoms such as polyuria, polydipsia, excessive hunger, nausea No significant hypoglycemic spells noted. Medications as noted below. Taking them regularly without complication/adverse reaction being reported today.   UTI recurring recently. She had an unknown antibiotic 2 weeks ago. Finished and 2 days later sx were back. Cipro has been better for her overall.    History [name] has a past medical history of Diabetes mellitus without complication ([place]); Hyperlipidemia; Hypertension; Frequent UTI; and Neck pain.   She has past surgical history that includes Appendectomy; Cholecystectomy; Tonsillectomy; and Joint replacement.   Her family history includes Cancer in her brother, father, and mother.She reports that she has never smoked. She has never used smokeless tobacco. She reports that she does not drink alcohol or use illicit drugs.  Current Outpatient Prescriptions on File Prior to Visit  Medication Sig Dispense  Refill  . amLODipine (NORVASC) 5 MG tablet Take 0.5 tablets (2.5 mg total) by mouth daily. 90 tablet 0  . Aspirin-Caffeine (BAYER BACK & BODY PAIN EX ST PO) Take 1 tablet by mouth daily as needed (pain).     . Calcium Carbonate-Vit D-Min (CALCIUM 1200 PO) Take 1 tablet by mouth every other day.     . diltiazem (CARDIZEM) 120 MG tablet TAKE ONE TABLET BY MOUTH ONCE DAILY 60 tablet 4  . gabapentin (NEURONTIN) 100 MG capsule TAKE ONE CAPSULE BY MOUTH ONCE DAILY AT BEDTIME INCREASE  AS  TOLERATED  TO  3  CAPSULES  AT  BEDTIME 90 capsule 1  . Insulin Glargine (LANTUS SOLOSTAR) 100 UNIT/ML Solostar Pen INJECT 30-45 UNITS SUBCUTANEOUSLY ONCE DAILY AT 10 PM as directed. 15 mL 2  . MAGNESIUM-ZINC PO Take 15 mg by mouth 3 (three) times daily.    . metFORMIN (GLUCOPHAGE) 500 MG tablet TAKE ONE TABLET BY MOUTH ONCE DAILY WITH  BREAKFAST 90 tablet 0  . omeprazole (PRILOSEC) 20 MG capsule TAKE ONE CAPSULE BY MOUTH TWICE DAILY BEFORE MEAL(S) 180 capsule 0  . ONE TOUCH ULTRA TEST test strip USE ONE STRIP TO CHECK GLUCOSE TWICE DAILY 100 each 1  . meloxicam (MOBIC) 7.5 MG tablet Take 1 tablet (7.5 mg total) by mouth daily. (Patient not taking: Reported on 07/03/2015) 30 tablet 2  . pravastatin (PRAVACHOL) 40 MG tablet TAKE ONE TABLET BY MOUTH ONCE DAILY (Patient not taking: Reported on 07/03/2015) 90 tablet 1   No current facility-administered medications on file prior to visit.    ROS Review of Systems  Constitutional: Negative for fever, activity change and appetite change.  HENT: Negative for congestion, rhinorrhea and sore throat.  Eyes: Negative for visual disturbance.  Respiratory: Negative for cough and shortness of breath.   Cardiovascular: Negative for chest pain and palpitations.  Gastrointestinal: Negative for nausea, abdominal pain and diarrhea.  Genitourinary: Positive for dysuria, urgency and frequency.  Musculoskeletal: Positive for myalgias, joint swelling and arthralgias.    Objective:    BP 123/68 mmHg  Pulse 75  Temp(Src) 97 F (36.1 C) (Oral)  Ht '4\' 11"'$  (1.499 m)  Wt 159 lb 9.6 oz (72.394 kg)  BMI 32.22 kg/m2  SpO2 95%  BP Readings from Last 3 Encounters:  07/03/15 123/68  02/19/15 147/74  02/07/15 155/79    Wt Readings from Last 3 Encounters:  07/03/15 159 lb 9.6 oz (72.394 kg)  02/19/15 161 lb (73.029 kg)  02/07/15 159 lb 12.8 oz (72.485 kg)     Physical Exam  Constitutional: She is oriented to person, place, and time. She appears well-developed and well-nourished. No distress.  HENT:  Head: Normocephalic and atraumatic.  Right Ear: External ear normal.  Left Ear: External ear normal.  Nose: Nose normal.  Mouth/Throat: Oropharynx is clear and moist.  Eyes: Conjunctivae and EOM are normal. Pupils are equal, round, and reactive to light.  Neck: Normal range of motion. Neck supple. No thyromegaly present.  Cardiovascular: Normal rate, regular rhythm and normal heart sounds.   No murmur heard. Pulmonary/Chest: Effort normal and breath sounds normal. No respiratory distress. She has no wheezes. She has no rales.  Abdominal: Soft. Bowel sounds are normal. She exhibits no distension. There is no tenderness.  Musculoskeletal: Normal range of motion.  Marked dorsal kyphosis and bent posture   Lymphadenopathy:    She has no cervical adenopathy.  Neurological: She is alert and oriented to person, place, and time. She has normal reflexes.  Skin: Skin is warm and dry.  Psychiatric: She has a normal mood and affect. Her behavior is normal. Judgment and thought content normal.    Lab Results  Component Value Date   HGBA1C 7.0 11/14/2014   HGBA1C 6.2 01/06/2014   HGBA1C 6.5% 07/14/2013    Lab Results  Component Value Date   WBC 7.1 11/14/2014   HGB 13.2 11/14/2014   HCT 41.5 11/14/2014   PLT 235 07/12/2014   GLUCOSE 129* 11/14/2014   CHOL 171 11/14/2014   TRIG 116 11/14/2014   HDL 48 11/14/2014   LDLCALC 100* 11/14/2014   ALT 33* 11/14/2014    AST 25 11/14/2014   NA 144 11/14/2014   K 4.1 11/14/2014   CL 103 11/14/2014   CREATININE 0.93 11/14/2014   BUN 17 11/14/2014   CO2 20 11/14/2014   TSH 1.500 11/14/2014   INR 1.9* 05/22/2007   HGBA1C 7.0 11/14/2014   Results for orders placed or performed in visit on 07/03/15  POCT UA - Microscopic Only  Result Value Ref Range   WBC, Ur, HPF, POC tntc    RBC, urine, microscopic negative    Bacteria, U Microscopic many    Mucus, UA negative    Epithelial cells, urine per micros occ    Crystals, Ur, HPF, POC negative    Casts, Ur, LPF, POC negative    Yeast, UA negative   POCT urinalysis dipstick  Result Value Ref Range   Color, UA gold    Clarity, UA cloudy    Glucose, UA negative    Bilirubin, UA negative    Ketones, UA negative    Spec Grav, UA 1.015    Blood, UA large    pH,  UA 5.0    Protein, UA trace    Urobilinogen, UA negative    Nitrite, UA negative    Leukocytes, UA large (3+) (A) Negative     Assessment & Plan:   Caliann was seen today for hypertension, diabetes, urinary tract infection and hyperlipidemia.  Diagnoses and all orders for this visit:  Hyperlipemia -     CMP14+EGFR -     Lipid panel  Essential hypertension -     CMP14+EGFR  Gastroesophageal reflux disease without esophagitis -     CBC with Differential/Platelet  Diabetic polyneuropathy associated with type 2 diabetes mellitus ([place]) -     CMP14+EGFR  Controlled type 2 diabetes mellitus with diabetic nephropathy, with long-term current use of insulin ([facility]) -     POCT glycosylated hemoglobin (Hb A1C) -     CMP14+EGFR -     Microalbumin / creatinine urine ratio  Urinary tract infection without hematuria, site unspecified -     POCT UA - Microscopic Only -     POCT urinalysis dipstick -     Urine culture  Other orders -     ciprofloxacin (CIPRO) 500 MG tablet; Take 1 tablet (500 mg total) by mouth 2 (two) times daily. Take for 10 days. -     nitrofurantoin, macrocrystal-monohydrate,  (MACROBID) 100 MG capsule; Take 1 capsule (100 mg total) by mouth at bedtime. To prevent infection of the bladder   I have discontinued Ms. Lemoine's triamterene-hydrochlorothiazide and nitrofurantoin (macrocrystal-monohydrate). I am also having her start on ciprofloxacin and nitrofurantoin (macrocrystal-monohydrate). Additionally, I am having her maintain her Aspirin-Caffeine (BAYER BACK & BODY PAIN EX ST PO), Calcium Carbonate-Vit D-Min (CALCIUM 1200 PO), MAGNESIUM-ZINC PO, meloxicam, diltiazem, pravastatin, gabapentin, amLODipine, ONE TOUCH ULTRA TEST, metFORMIN, omeprazole, Insulin Glargine, and tamsulosin.  Meds ordered this encounter  Medications  . tamsulosin (FLOMAX) 0.4 MG CAPS capsule    Sig:   . ciprofloxacin (CIPRO) 500 MG tablet    Sig: Take 1 tablet (500 mg total) by mouth 2 (two) times daily. Take for 10 days.    Dispense:  20 tablet    Refill:  0  . nitrofurantoin, macrocrystal-monohydrate, (MACROBID) 100 MG capsule    Sig: Take 1 capsule (100 mg total) by mouth at bedtime. To prevent infection of the bladder    Dispense:  30 capsule    Refill:  5     Follow-up: Return in about 3 months (around 09/30/2015) for diabetes, hypertension, cholesterol.  Claretta Fraise, M.D.

## 2015-07-04 LAB — LIPID PANEL
CHOLESTEROL TOTAL: 247 mg/dL — AB (ref 100–199)
Chol/HDL Ratio: 6.2 ratio units — ABNORMAL HIGH (ref 0.0–4.4)
HDL: 40 mg/dL (ref >39)
LDL CALC: 164 mg/dL — AB (ref 0–99)
TRIGLYCERIDES: 217 mg/dL — AB (ref 0–149)
VLDL Cholesterol Cal: 43 mg/dL — ABNORMAL HIGH (ref 5–40)

## 2015-07-04 LAB — CBC WITH DIFFERENTIAL/PLATELET
BASOS ABS: 0.1 10*3/uL (ref 0.0–0.2)
Basos: 1 %
EOS (ABSOLUTE): 0.3 10*3/uL (ref 0.0–0.4)
EOS: 3 %
HEMATOCRIT: 42.5 % (ref 34.0–46.6)
Hemoglobin: 13.1 g/dL (ref 11.1–15.9)
Immature Grans (Abs): 0 10*3/uL (ref 0.0–0.1)
Immature Granulocytes: 0 %
LYMPHS ABS: 1.6 10*3/uL (ref 0.7–3.1)
Lymphs: 18 %
MCH: 25 pg — AB (ref 26.6–33.0)
MCHC: 30.8 g/dL — AB (ref 31.5–35.7)
MCV: 81 fL (ref 79–97)
MONOS ABS: 0.9 10*3/uL (ref 0.1–0.9)
Monocytes: 10 %
Neutrophils Absolute: 5.9 10*3/uL (ref 1.4–7.0)
Neutrophils: 68 %
Platelets: 280 10*3/uL (ref 150–379)
RBC: 5.24 x10E6/uL (ref 3.77–5.28)
RDW: 15.7 % — AB (ref 12.3–15.4)
WBC: 8.7 10*3/uL (ref 3.4–10.8)

## 2015-07-04 LAB — CMP14+EGFR
ALK PHOS: 67 IU/L (ref 39–117)
ALT: 28 IU/L (ref 0–32)
AST: 21 IU/L (ref 0–40)
Albumin/Globulin Ratio: 1.5 (ref 1.1–2.5)
Albumin: 4.1 g/dL (ref 3.5–4.7)
BILIRUBIN TOTAL: 0.2 mg/dL (ref 0.0–1.2)
BUN/Creatinine Ratio: 23 (ref 11–26)
BUN: 21 mg/dL (ref 8–27)
CHLORIDE: 98 mmol/L (ref 96–106)
CO2: 24 mmol/L (ref 18–29)
CREATININE: 0.91 mg/dL (ref 0.57–1.00)
Calcium: 9.8 mg/dL (ref 8.7–10.3)
GFR calc Af Amer: 66 mL/min/{1.73_m2} (ref >59)
GFR calc non Af Amer: 57 mL/min/{1.73_m2} — ABNORMAL LOW (ref >59)
GLOBULIN, TOTAL: 2.8 g/dL (ref 1.5–4.5)
Glucose: 111 mg/dL — ABNORMAL HIGH (ref 65–99)
POTASSIUM: 5.5 mmol/L — AB (ref 3.5–5.2)
SODIUM: 141 mmol/L (ref 134–144)
Total Protein: 6.9 g/dL (ref 6.0–8.5)

## 2015-07-04 LAB — MICROALBUMIN / CREATININE URINE RATIO
Creatinine, Urine: 36.8 mg/dL
MICROALB/CREAT RATIO: 219.3 mg/g{creat} — AB (ref 0.0–30.0)
Microalbumin, Urine: 80.7 ug/mL

## 2015-07-04 NOTE — Therapy (Signed)
[facility] [place], [place], [address] Phone: [phone]   Fax:  [phone]  Physical Therapy Treatment  Patient Details  Name: [name] MRN: [id number] Date of Birth: [birth year] No Data Recorded  Encounter Date: 06/22/2014    Past Medical History  Diagnosis Date  . Diabetes mellitus without complication ([place])   . Hyperlipidemia   . Hypertension   . Frequent UTI   . Neck pain     Past Surgical History  Procedure Laterality Date  . Appendectomy    . Cholecystectomy    . Tonsillectomy    . Joint replacement      bilat knee     There were no vitals filed for this visit.  Visit Diagnosis:  Right shoulder pain  Shoulder stiffness, right                                    PT Long Term Goals - 06/22/14 1314    PT LONG TERM GOAL #1   Title demonstrate and/or verbalize techniques to reduce the risk of re-injury to include info on: posture   Time 6   Period Weeks   Status Achieved  06/08/14   PT LONG TERM GOAL #2   Title be independent with advanced HEP   Time 6   Status On-going   PT LONG TERM GOAL #3   Title increase ROM active right shoulder flexion to 125 degrees   Time 6   Period Weeks   Status On-going   PT LONG TERM GOAL #4   Title increase ROM with ER 55 degrees   Time 6   Period Weeks   Status On-going   PT LONG TERM GOAL #5   Title increase right shoulder strength to 4/5 so able to perform tasks that require antigravity movements.   Time 6   Period Weeks   Status On-going   Additional Long Term Goals   Additional Long Term Goals Yes   PT LONG TERM GOAL #6   Title perform ADL's with right shoulder pain not 4/10   Time 6   Period Weeks   Status On-going               Problem List Patient Active Problem List   Diagnosis Date Noted  . Diabetic polyneuropathy associated with type 2 diabetes mellitus ([place]) 11/14/2014  . Neck pain   . HLD  (hyperlipidemia) 02/11/2013  . Fibromyalgia 02/11/2013  . Arthritis 11/09/2012  . CVA 08/01/2008  . OSTEOARTHRITIS 04/09/2007  . Hyperlipemia 03/08/2007  . Essential hypertension 03/08/2007  . PULMONARY FIBROSIS, POSTINFLAMMATORY 03/08/2007  . GERD 03/08/2007  . BREAST CANCER, HX OF 03/08/2007   PHYSICAL THERAPY DISCHARGE SUMMARY  Visits from Start of Care:   Current functional level related to goals / functional outcomes: Please see above.   Remaining deficits: Continued right shoulder pain, loss of ROM and strength.   Education / Equipment: HEP.  Plan: Patient agrees to discharge.  Patient goals were not met. Patient is being discharged due to not returning since the last visit.  ?????       Crisol Muecke, [place] MPT 07/04/2015, 5:56 PM  [facility] [address] [place], [place], [address] Phone: [phone]   Fax:  [phone]  Name: [name] MRN: [id number] Date of Birth: December 09, 1925

## 2015-07-05 ENCOUNTER — Telehealth: Payer: Self-pay | Admitting: Family Medicine

## 2015-07-05 LAB — URINE CULTURE

## 2015-07-06 ENCOUNTER — Telehealth: Payer: Self-pay | Admitting: Family Medicine

## 2015-07-06 NOTE — Telephone Encounter (Signed)
Done 07/03/15

## 2015-07-06 NOTE — Telephone Encounter (Signed)
Pt notified of results Verbalizes understanding 

## 2015-07-17 ENCOUNTER — Telehealth: Payer: Self-pay | Admitting: Family Medicine

## 2015-07-17 NOTE — Telephone Encounter (Signed)
Patient is calling about prior authorization on macrobid can you please check on this.

## 2015-07-17 NOTE — Telephone Encounter (Signed)
Printed for Debbi 

## 2015-07-18 ENCOUNTER — Telehealth: Payer: Self-pay | Admitting: Family Medicine

## 2015-07-18 ENCOUNTER — Telehealth: Payer: Self-pay

## 2015-07-18 NOTE — Telephone Encounter (Signed)
Insurance prior authorized Nitrofurantoin through 05/04/16

## 2015-07-19 NOTE — Telephone Encounter (Signed)
done

## 2015-07-26 ENCOUNTER — Other Ambulatory Visit: Payer: PPO

## 2015-07-26 DIAGNOSIS — G8929 Other chronic pain: Secondary | ICD-10-CM | POA: Diagnosis not present

## 2015-07-26 DIAGNOSIS — M25511 Pain in right shoulder: Secondary | ICD-10-CM | POA: Diagnosis not present

## 2015-08-06 ENCOUNTER — Telehealth: Payer: Self-pay | Admitting: Family Medicine

## 2015-08-07 ENCOUNTER — Telehealth: Payer: Self-pay | Admitting: Family Medicine

## 2015-08-07 NOTE — Telephone Encounter (Signed)
Spoke with pt regarding BP readings  BP readings have been elevated Informed pt of Dr Livia Snellen recommendation She will monitor readings and call back to report

## 2015-08-07 NOTE — Telephone Encounter (Signed)
Go ahead with increase to BID diltiazem

## 2015-08-07 NOTE — Telephone Encounter (Signed)
Pt states her BP readings have been elevated and she didn't know if she needed double her diltiazem, she currently takes 120mg . The highest reading she had yesterday was 195/79 and this morning her BP was 154/79. She usually takes her diltiazem at night. Please advise.

## 2015-08-10 ENCOUNTER — Ambulatory Visit (INDEPENDENT_AMBULATORY_CARE_PROVIDER_SITE_OTHER): Payer: PPO | Admitting: Family Medicine

## 2015-08-10 ENCOUNTER — Encounter: Payer: Self-pay | Admitting: Family Medicine

## 2015-08-10 VITALS — BP 131/76 | HR 74 | Temp 97.2°F | Ht 59.0 in | Wt 157.8 lb

## 2015-08-10 DIAGNOSIS — I10 Essential (primary) hypertension: Secondary | ICD-10-CM | POA: Diagnosis not present

## 2015-08-10 MED ORDER — AMLODIPINE BESYLATE 5 MG PO TABS: 5.0000 mg | ORAL_TABLET | Freq: Every day | ORAL | Status: DC

## 2015-08-10 NOTE — Progress Notes (Signed)
Subjective:  Patient ID: [name], female    DOB: 09-03-1925  Age: 82 y.o. MRN: [id number]  CC: Hypertension   HPI [name] presents for recheck of BP. Was feeling odd several days ago and checked BP. Was high so she started frequent checks. Several elevated as high as 211/99. (Log returned.)   History [name] has a past medical history of Diabetes mellitus without complication ([place]); Hyperlipidemia; Hypertension; Frequent UTI; and Neck pain.   She has past surgical history that includes Appendectomy; Cholecystectomy; Tonsillectomy; and Joint replacement.   Her family history includes Cancer in her brother, father, and mother.She reports that she has never smoked. She has never used smokeless tobacco. She reports that she does not drink alcohol or use illicit drugs.    ROS Review of Systems  Constitutional: Negative for fever, activity change and appetite change.  HENT: Negative for congestion, rhinorrhea and sore throat.   Eyes: Negative for visual disturbance.  Respiratory: Negative for cough and shortness of breath.   Cardiovascular: Negative for chest pain and palpitations.  Gastrointestinal: Negative for nausea, abdominal pain and diarrhea.  Genitourinary: Negative for dysuria.  Musculoskeletal: Negative for myalgias and arthralgias.    Objective:  BP 131/76 mmHg  Pulse 74  Temp(Src) 97.2 F (36.2 C) (Oral)  Ht 4\' 11"  (1.499 m)  Wt 157 lb 12.8 oz (71.578 kg)  BMI 31.85 kg/m2  BP Readings from Last 3 Encounters:  08/10/15 131/76  07/03/15 123/68  02/19/15 147/74    Wt Readings from Last 3 Encounters:  08/10/15 157 lb 12.8 oz (71.578 kg)  07/03/15 159 lb 9.6 oz (72.394 kg)  02/19/15 161 lb (73.029 kg)     Physical Exam  Constitutional: She is oriented to person, place, and time. She appears well-developed and well-nourished. No distress.  HENT:  Head: Normocephalic and atraumatic.  Eyes: Conjunctivae are normal. Pupils are equal, round, and  reactive to light.  Neck: Normal range of motion. Neck supple. No thyromegaly present.  Cardiovascular: Normal rate, regular rhythm and normal heart sounds.   No murmur heard. Pulmonary/Chest: Effort normal and breath sounds normal. No respiratory distress. She has no wheezes. She has no rales.  Abdominal: Soft. Bowel sounds are normal. She exhibits no distension. There is no tenderness.  Musculoskeletal: Normal range of motion.  Lymphadenopathy:    She has no cervical adenopathy.  Neurological: She is alert and oriented to person, place, and time.  Skin: Skin is warm and dry.  Psychiatric: She has a normal mood and affect. Her behavior is normal. Judgment and thought content normal.     Lab Results  Component Value Date   WBC 8.7 07/03/2015   HGB 13.2 11/14/2014   HCT 42.5 07/03/2015   PLT 280 07/03/2015   GLUCOSE 111* 07/03/2015   CHOL 247* 07/03/2015   TRIG 217* 07/03/2015   HDL 40 07/03/2015   LDLCALC 164* 07/03/2015   ALT 28 07/03/2015   AST 21 07/03/2015   NA 141 07/03/2015   K 5.5* 07/03/2015   CL 98 07/03/2015   CREATININE 0.91 07/03/2015   BUN 21 07/03/2015   CO2 24 07/03/2015   TSH 1.500 11/14/2014   INR 1.9* 05/22/2007   HGBA1C 6.8 07/03/2015    US Venous Img Lower Unilateral Left  07/13/2014  CLINICAL DATA:  Edema EXAM: LEFT LOWER EXTREMITY VENOUS DUPLEX ULTRASOUND TECHNIQUE: Doppler venous assessment of the left lower extremity deep venous system was performed, including characterization of spectral flow, compressibility, and phasicity. COMPARISON:  None. FINDINGS: There is complete compressibility of the left common femoral, femoral, and popliteal veins. Doppler analysis demonstrates respiratory phasicity and augmentation of flow upon calf compression. No evidence of superficial vein or calf vein thrombosis. IMPRESSION: No evidence of left lower extremity DVT. Electronically Signed   By: Marybelle Killings M.D.   On: 07/13/2014 14:12    Assessment & Plan:   [name]  was seen today for hypertension.  Diagnoses and all orders for this visit:  Essential hypertension  Other orders -     amLODipine (NORVASC) 5 MG tablet; Take 1 tablet (5 mg total) by mouth daily.     I have discontinued [name]'s meloxicam, tamsulosin, ciprofloxacin, and nitrofurantoin (macrocrystal-monohydrate). I have also changed her amLODipine. Additionally, I am having her maintain her Aspirin-Caffeine (BAYER BACK & BODY PAIN EX ST PO), Calcium Carbonate-Vit D-Min (CALCIUM 1200 PO), MAGNESIUM-ZINC PO, diltiazem, pravastatin, gabapentin, ONE TOUCH ULTRA TEST, omeprazole, Insulin Glargine, metFORMIN, Vitamin D3, and sulfamethoxazole-trimethoprim.  Meds ordered this encounter  Medications  . Cholecalciferol (VITAMIN D3) 5000 units CAPS    Sig: Take 1 capsule by mouth daily.  Marland Kitchen sulfamethoxazole-trimethoprim (BACTRIM,SEPTRA) 400-80 MG tablet    Sig: Take 1 tablet by mouth daily.  Marland Kitchen amLODipine (NORVASC) 5 MG tablet    Sig: Take 1 tablet (5 mg total) by mouth daily.    Dispense:  90 tablet    Refill:  1     Follow-up: Return in about 8 weeks (around 10/02/2015), or if symptoms worsen or fail to improve, for diabetes, hypertension.  Claretta Fraise, M.D.

## 2015-09-06 DIAGNOSIS — L84 Corns and callosities: Secondary | ICD-10-CM | POA: Diagnosis not present

## 2015-09-06 DIAGNOSIS — B351 Tinea unguium: Secondary | ICD-10-CM | POA: Diagnosis not present

## 2015-09-06 DIAGNOSIS — E1151 Type 2 diabetes mellitus with diabetic peripheral angiopathy without gangrene: Secondary | ICD-10-CM | POA: Diagnosis not present

## 2015-09-20 ENCOUNTER — Encounter: Payer: Self-pay | Admitting: Family Medicine

## 2015-09-20 ENCOUNTER — Other Ambulatory Visit (INDEPENDENT_AMBULATORY_CARE_PROVIDER_SITE_OTHER): Payer: PPO

## 2015-09-20 ENCOUNTER — Ambulatory Visit (INDEPENDENT_AMBULATORY_CARE_PROVIDER_SITE_OTHER): Payer: PPO | Admitting: Family Medicine

## 2015-09-20 ENCOUNTER — Ambulatory Visit (INDEPENDENT_AMBULATORY_CARE_PROVIDER_SITE_OTHER): Payer: PPO

## 2015-09-20 VITALS — BP 129/73 | HR 78 | Temp 97.0°F | Ht 59.0 in | Wt 161.0 lb

## 2015-09-20 DIAGNOSIS — E1142 Type 2 diabetes mellitus with diabetic polyneuropathy: Secondary | ICD-10-CM | POA: Diagnosis not present

## 2015-09-20 DIAGNOSIS — I10 Essential (primary) hypertension: Secondary | ICD-10-CM

## 2015-09-20 DIAGNOSIS — M25552 Pain in left hip: Secondary | ICD-10-CM | POA: Diagnosis not present

## 2015-09-20 DIAGNOSIS — E785 Hyperlipidemia, unspecified: Secondary | ICD-10-CM | POA: Diagnosis not present

## 2015-09-20 DIAGNOSIS — R5383 Other fatigue: Secondary | ICD-10-CM

## 2015-09-20 DIAGNOSIS — M25551 Pain in right hip: Secondary | ICD-10-CM | POA: Diagnosis not present

## 2015-09-20 DIAGNOSIS — M25511 Pain in right shoulder: Secondary | ICD-10-CM | POA: Diagnosis not present

## 2015-09-20 DIAGNOSIS — R609 Edema, unspecified: Secondary | ICD-10-CM

## 2015-09-20 DIAGNOSIS — G8929 Other chronic pain: Secondary | ICD-10-CM | POA: Diagnosis not present

## 2015-09-20 DIAGNOSIS — R0602 Shortness of breath: Secondary | ICD-10-CM | POA: Diagnosis not present

## 2015-09-20 MED ORDER — FUROSEMIDE 20 MG PO TABS: 20.0000 mg | ORAL_TABLET | Freq: Every day | ORAL | Status: DC

## 2015-09-20 NOTE — Patient Instructions (Signed)
Reduce sodium intake as much as possible Take fluid pill daily until your return visit We will call with the results of the chest x-ray and the lab work as soon as those results become available There was some slight congestion in the right lung base posteriorly and we may need an antibiotic but we will wait until the chest x-rays return before we make that decision Continue to take your other medications as you have been doing

## 2015-09-20 NOTE — Progress Notes (Signed)
Subjective:    Patient ID: [name], female    DOB: 1925-05-30, 82 y.o.   MRN: [id number]  HPI Patient here today for edema of hands, feet and ankles. This was noticed almost a week ago. The patient has had some dizziness and increased swelling in her hands and ankles feet and legs. This is been going on for about 1 week. Looking back at her previous weight was 157 pounds and it is 161 pounds today. The patient has had some increased shortness of breath for about 3 months. She's had some tightness and she has not been sleeping well but primarily due to hip pain. She saw the orthopedic surgeon this morning. She does not take any NSAIDs. On reviewing her medicines she does take calcium channel blockers. She is not sure when she had her last chest x-ray. Thinking ahead we will probably go ahead and get a BMP CBC thyroid and BMP today. She is pleasant and alert and a good historian.      Patient Active Problem List   Diagnosis Date Noted  . Diabetic polyneuropathy associated with type 2 diabetes mellitus ([place]) 11/14/2014  . Neck pain   . HLD (hyperlipidemia) 02/11/2013  . Fibromyalgia 02/11/2013  . Arthritis 11/09/2012  . CVA 08/01/2008  . OSTEOARTHRITIS 04/09/2007  . Hyperlipemia 03/08/2007  . Essential hypertension 03/08/2007  . PULMONARY FIBROSIS, POSTINFLAMMATORY 03/08/2007  . GERD 03/08/2007  . BREAST CANCER, HX OF 03/08/2007   Outpatient Encounter Prescriptions as of 09/20/2015  Medication Sig  . amLODipine (NORVASC) 5 MG tablet Take 1 tablet (5 mg total) by mouth daily.  . Aspirin-Caffeine (BAYER BACK & BODY PAIN EX ST PO) Take 1 tablet by mouth daily as needed (pain).   . Calcium Carbonate-Vit D-Min (CALCIUM 1200 PO) Take 1 tablet by mouth every other day.   . Cholecalciferol (VITAMIN D3) 5000 units CAPS Take 1 capsule by mouth daily.  Marland Kitchen diltiazem (CARDIZEM) 120 MG tablet TAKE ONE TABLET BY MOUTH ONCE DAILY  . gabapentin (NEURONTIN) 100 MG capsule TAKE ONE CAPSULE BY  MOUTH ONCE DAILY AT BEDTIME INCREASE  AS  TOLERATED  TO  3  CAPSULES  AT  BEDTIME  . Insulin Glargine (LANTUS SOLOSTAR) 100 UNIT/ML Solostar Pen INJECT 30-45 UNITS SUBCUTANEOUSLY ONCE DAILY AT 10 PM as directed.  Marland Kitchen MAGNESIUM-ZINC PO Take 15 mg by mouth 3 (three) times daily.  . metFORMIN (GLUCOPHAGE) 500 MG tablet TAKE ONE TABLET BY MOUTH ONCE DAILY WITH  BREAKFAST  . omeprazole (PRILOSEC) 20 MG capsule TAKE ONE CAPSULE BY MOUTH TWICE DAILY BEFORE MEAL(S)  . ONE TOUCH ULTRA TEST test strip USE ONE STRIP TO CHECK GLUCOSE TWICE DAILY  . [DISCONTINUED] pravastatin (PRAVACHOL) 40 MG tablet TAKE ONE TABLET BY MOUTH ONCE DAILY  . [DISCONTINUED] sulfamethoxazole-trimethoprim (BACTRIM,SEPTRA) 400-80 MG tablet Take 1 tablet by mouth daily.   No facility-administered encounter medications on file as of 09/20/2015.     Review of Systems  Constitutional: Negative.   HENT: Negative.   Eyes: Negative.   Respiratory: Negative.   Cardiovascular: Positive for leg swelling.  Gastrointestinal: Negative.   Endocrine: Negative.   Genitourinary: Negative.   Musculoskeletal: Negative.   Skin: Negative.   Allergic/Immunologic: Negative.   Neurological: Positive for dizziness (today ).  Hematological: Negative.   Psychiatric/Behavioral: Negative.        Objective:   Physical Exam  Constitutional: She is oriented to person, place, and time. She appears well-developed and well-nourished. No distress.  HENT:  Head: Normocephalic and atraumatic.  Eyes: Conjunctivae and EOM are normal. Pupils are equal, round, and reactive to light. Right eye exhibits no discharge. Left eye exhibits no discharge. No scleral icterus.  Neck: Normal range of motion. Neck supple. No thyromegaly present.  Cardiovascular: Normal rate, regular rhythm and normal heart sounds.   No murmur heard. The heart has a regular rate and rhythm at 72/m  Pulmonary/Chest: Effort normal. No respiratory distress. She has no wheezes. She has no  rales.  Slight congestion in the right base posteriorly  Abdominal: Soft. Bowel sounds are normal. She exhibits no mass. There is no tenderness. There is no rebound and no guarding.  Musculoskeletal: She exhibits edema (one plus pretibial). She exhibits no tenderness.  The patient uses a cane for ambulation  Lymphadenopathy:    She has no cervical adenopathy.  Neurological: She is alert and oriented to person, place, and time.  Skin: Skin is warm and dry. No rash noted.  Psychiatric: She has a normal mood and affect. Her behavior is normal. Judgment and thought content normal.  Nursing note and vitals reviewed.  BP 129/73 mmHg  Pulse 78  Temp(Src) 97 F (36.1 C) (Oral)  Ht '4\' 11"'$  (1.499 m)  Wt 161 lb (73.029 kg)  BMI 32.50 kg/m2  [facility] reading (PRIMARY) by  Dr. Brunilda Payor x-ray results pending--  Labs drawn today include CBC, BMP, thyroid profile, BNP                                        Assessment & Plan:  1. Diabetic polyneuropathy associated with type 2 diabetes mellitus ([facility]) - BMP8+EGFR - CBC with Differential/Platelet  2. Essential hypertension -The blood pressure is good today. The patient will continue her current treatment - BMP8+EGFR - CBC with Differential/Platelet  3. Hyperlipemia - CBC with Differential/Platelet  4. Edema, unspecified type - furosemide (LASIX) 20 MG tablet; Take 1 tablet (20 mg total) by mouth daily.  Dispense: 30 tablet; Refill: 3 - Thyroid Panel With TSH - DG Chest 2 View; Future - Brain natriuretic peptide  5. Other fatigue - Thyroid Panel With TSH - DG Chest 2 View; Future  6. Shortness of breath -Chest x-ray and BNP and CBC  Meds ordered this encounter  Medications  . furosemide (LASIX) 20 MG tablet    Sig: Take 1 tablet (20 mg total) by mouth daily.    Dispense:  30 tablet    Refill:  3   Patient Instructions  Reduce sodium intake as much as possible Take fluid pill daily until your return visit We will call with  the results of the chest x-ray and the lab work as soon as those results become available There was some slight congestion in the right lung base posteriorly and we may need an antibiotic but we will wait until the chest x-rays return before we make that decision Continue to take your other medications as you have been doing   Arrie Senate MD

## 2015-09-21 ENCOUNTER — Telehealth: Payer: Self-pay | Admitting: Family Medicine

## 2015-09-21 ENCOUNTER — Ambulatory Visit: Payer: PPO | Admitting: Family Medicine

## 2015-09-21 LAB — BMP8+EGFR
BUN/Creatinine Ratio: 23 (ref 12–28)
BUN: 21 mg/dL (ref 8–27)
CALCIUM: 9.4 mg/dL (ref 8.7–10.3)
CHLORIDE: 99 mmol/L (ref 96–106)
CO2: 23 mmol/L (ref 18–29)
Creatinine, Ser: 0.93 mg/dL (ref 0.57–1.00)
GFR calc Af Amer: 64 mL/min/{1.73_m2} (ref >59)
GFR calc non Af Amer: 55 mL/min/{1.73_m2} — ABNORMAL LOW (ref >59)
GLUCOSE: 92 mg/dL (ref 65–99)
Potassium: 4.7 mmol/L (ref 3.5–5.2)
Sodium: 141 mmol/L (ref 134–144)

## 2015-09-21 LAB — CBC WITH DIFFERENTIAL/PLATELET
BASOS ABS: 0.1 10*3/uL (ref 0.0–0.2)
Basos: 1 %
EOS (ABSOLUTE): 0.4 10*3/uL (ref 0.0–0.4)
Eos: 6 %
HEMOGLOBIN: 13.6 g/dL (ref 11.1–15.9)
Hematocrit: 42.7 % (ref 34.0–46.6)
IMMATURE GRANS (ABS): 0 10*3/uL (ref 0.0–0.1)
IMMATURE GRANULOCYTES: 0 %
LYMPHS: 21 %
Lymphocytes Absolute: 1.4 10*3/uL (ref 0.7–3.1)
MCH: 26.2 pg — ABNORMAL LOW (ref 26.6–33.0)
MCHC: 31.9 g/dL (ref 31.5–35.7)
MCV: 82 fL (ref 79–97)
MONOCYTES: 12 %
Monocytes Absolute: 0.8 10*3/uL (ref 0.1–0.9)
Neutrophils Absolute: 3.9 10*3/uL (ref 1.4–7.0)
Neutrophils: 60 %
PLATELETS: 268 10*3/uL (ref 150–379)
RBC: 5.19 x10E6/uL (ref 3.77–5.28)
RDW: 16.5 % — AB (ref 12.3–15.4)
WBC: 6.6 10*3/uL (ref 3.4–10.8)

## 2015-09-21 LAB — THYROID PANEL WITH TSH
Free Thyroxine Index: 1.8 (ref 1.2–4.9)
T3 Uptake Ratio: 27 % (ref 24–39)
T4, Total: 6.5 ug/dL (ref 4.5–12.0)
TSH: 1.42 u[IU]/mL (ref 0.450–4.500)

## 2015-09-21 LAB — BRAIN NATRIURETIC PEPTIDE: BNP: 94.6 pg/mL (ref 0.0–100.0)

## 2015-09-21 NOTE — Telephone Encounter (Signed)
Pt aware to cont fluid pill and see stacks on monday

## 2015-09-24 ENCOUNTER — Encounter: Payer: Self-pay | Admitting: Family Medicine

## 2015-09-24 ENCOUNTER — Ambulatory Visit (INDEPENDENT_AMBULATORY_CARE_PROVIDER_SITE_OTHER): Payer: PPO

## 2015-09-24 ENCOUNTER — Ambulatory Visit (INDEPENDENT_AMBULATORY_CARE_PROVIDER_SITE_OTHER): Payer: PPO | Admitting: Family Medicine

## 2015-09-24 ENCOUNTER — Telehealth: Payer: Self-pay

## 2015-09-24 VITALS — BP 131/73 | HR 76 | Temp 97.7°F | Ht 59.0 in | Wt 164.6 lb

## 2015-09-24 DIAGNOSIS — R609 Edema, unspecified: Secondary | ICD-10-CM | POA: Diagnosis not present

## 2015-09-24 DIAGNOSIS — I10 Essential (primary) hypertension: Secondary | ICD-10-CM | POA: Diagnosis not present

## 2015-09-24 DIAGNOSIS — R601 Generalized edema: Secondary | ICD-10-CM

## 2015-09-24 DIAGNOSIS — R0602 Shortness of breath: Secondary | ICD-10-CM

## 2015-09-24 MED ORDER — POTASSIUM CHLORIDE CRYS ER 20 MEQ PO TBCR: 20.0000 meq | EXTENDED_RELEASE_TABLET | Freq: Every day | ORAL | Status: DC

## 2015-09-24 MED ORDER — FUROSEMIDE 40 MG PO TABS: 40.0000 mg | ORAL_TABLET | Freq: Every day | ORAL | Status: DC

## 2015-09-24 NOTE — Progress Notes (Signed)
Subjective:  Patient ID: [name], female    DOB: [birth year]  Age: 82 y.o. MRN: [id number]  CC: Fatigue and Edema   HPI [name] presents for No better from last week. Lasix didn't help. Has had DOE, moderate. Energy not good. Labs, XR from previous visit last week with Dr. Laurance Flatten were reviewed. CXR was nml.   History [name] has a past medical history of Diabetes mellitus without complication ([place]); Hyperlipidemia; Hypertension; Frequent UTI; and Neck pain.   She has past surgical history that includes Appendectomy; Cholecystectomy; Tonsillectomy; and Joint replacement.   Her family history includes Cancer in her brother, father, and mother.She reports that she has never smoked. She has never used smokeless tobacco. She reports that she does not drink alcohol or use illicit drugs.  Current Outpatient Prescriptions on File Prior to Visit  Medication Sig Dispense Refill  . amLODipine (NORVASC) 5 MG tablet Take 1 tablet (5 mg total) by mouth daily. 90 tablet 1  . Aspirin-Caffeine (BAYER BACK & BODY PAIN EX ST PO) Take 1 tablet by mouth daily as needed (pain).     . Calcium Carbonate-Vit D-Min (CALCIUM 1200 PO) Take 1 tablet by mouth every other day.     . Cholecalciferol (VITAMIN D3) 5000 units CAPS Take 1 capsule by mouth daily.    Marland Kitchen diltiazem (CARDIZEM) 120 MG tablet TAKE ONE TABLET BY MOUTH ONCE DAILY 60 tablet 4  . gabapentin (NEURONTIN) 100 MG capsule TAKE ONE CAPSULE BY MOUTH ONCE DAILY AT BEDTIME INCREASE  AS  TOLERATED  TO  3  CAPSULES  AT  BEDTIME 90 capsule 1  . Insulin Glargine (LANTUS SOLOSTAR) 100 UNIT/ML Solostar Pen INJECT 30-45 UNITS SUBCUTANEOUSLY ONCE DAILY AT 10 PM as directed. 15 mL 2  . MAGNESIUM-ZINC PO Take 15 mg by mouth 3 (three) times daily.    . metFORMIN (GLUCOPHAGE) 500 MG tablet TAKE ONE TABLET BY MOUTH ONCE DAILY WITH  BREAKFAST 90 tablet 1  . omeprazole (PRILOSEC) 20 MG capsule TAKE ONE CAPSULE BY MOUTH TWICE DAILY BEFORE MEAL(S) 180 capsule 0    . ONE TOUCH ULTRA TEST test strip USE ONE STRIP TO CHECK GLUCOSE TWICE DAILY 100 each 1   No current facility-administered medications on file prior to visit.     ROS Review of Systems  Constitutional: Negative for fever, activity change and appetite change.  HENT: Negative for congestion, rhinorrhea and sore throat.   Eyes: Negative for visual disturbance.  Respiratory: Positive for shortness of breath. Negative for cough and wheezing.   Cardiovascular: Positive for leg swelling. Negative for chest pain and palpitations.  Gastrointestinal: Negative for nausea, abdominal pain and diarrhea.  Genitourinary: Negative for dysuria.  Musculoskeletal: Negative for myalgias and arthralgias.    Objective:  BP 131/73 mmHg  Pulse 76  Temp(Src) 97.7 F (36.5 C) (Oral)  Ht 4\' 11"  (1.499 m)  Wt 164 lb 9.6 oz (74.662 kg)  BMI 33.23 kg/m2  SpO2 97%  BP Readings from Last 3 Encounters:  09/24/15 131/73  09/20/15 129/73  08/10/15 131/76    Wt Readings from Last 3 Encounters:  09/24/15 164 lb 9.6 oz (74.662 kg)  09/20/15 161 lb (73.029 kg)  08/10/15 157 lb 12.8 oz (71.578 kg)     Physical Exam   Lab Results  Component Value Date   WBC 6.6 09/20/2015   HGB 13.2 11/14/2014   HCT 42.7 09/20/2015   PLT 268 09/20/2015   GLUCOSE 92 09/20/2015   CHOL 247* 07/03/2015  TRIG 217* 07/03/2015   HDL 40 07/03/2015   LDLCALC 164* 07/03/2015   ALT 28 07/03/2015   AST 21 07/03/2015   NA 141 09/20/2015   K 4.7 09/20/2015   CL 99 09/20/2015   CREATININE 0.93 09/20/2015   BUN 21 09/20/2015   CO2 23 09/20/2015   TSH 1.420 09/20/2015   INR 1.9* 05/22/2007   HGBA1C 6.8 07/03/2015    US Venous Img Lower Unilateral Left  07/13/2014  CLINICAL DATA:  Edema EXAM: LEFT LOWER EXTREMITY VENOUS DUPLEX ULTRASOUND TECHNIQUE: Doppler venous assessment of the left lower extremity deep venous system was performed, including characterization of spectral flow, compressibility, and phasicity.  COMPARISON:  None. FINDINGS: There is complete compressibility of the left common femoral, femoral, and popliteal veins. Doppler analysis demonstrates respiratory phasicity and augmentation of flow upon calf compression. No evidence of superficial vein or calf vein thrombosis. IMPRESSION: No evidence of left lower extremity DVT. Electronically Signed   By: [name] M.D.   On: 07/13/2014 14:12    Assessment & Plan:   [name] was seen today for fatigue and edema.  Diagnoses and all orders for this visit:  Generalized edema -     DG Chest 2 View; Future  Essential hypertension  Shortness of breath -     DG Chest 2 View; Future  Edema, unspecified type -     furosemide (LASIX) 40 MG tablet; Take 1 tablet (40 mg total) by mouth daily.  Other orders -     potassium chloride SA (K-DUR,KLOR-CON) 20 MEQ tablet; Take 1 tablet (20 mEq total) by mouth daily. As a potassium supplement    Chest x-ray negative for acute change particularly no pulmonary edema or pleural effusion or cardiomegaly.  I have changed [name]'s furosemide. I am also having her start on potassium chloride SA. Additionally, I am having her maintain her Aspirin-Caffeine (BAYER BACK & BODY PAIN EX ST PO), Calcium Carbonate-Vit D-Min (CALCIUM 1200 PO), MAGNESIUM-ZINC PO, diltiazem, gabapentin, ONE TOUCH ULTRA TEST, omeprazole, Insulin Glargine, metFORMIN, Vitamin D3, amLODipine, and meloxicam.  Meds ordered this encounter  Medications  . meloxicam (MOBIC) 15 MG tablet    Sig: Take 15 mg by mouth daily.   . furosemide (LASIX) 40 MG tablet    Sig: Take 1 tablet (40 mg total) by mouth daily.    Dispense:  30 tablet    Refill:  1  . potassium chloride SA (K-DUR,KLOR-CON) 20 MEQ tablet    Sig: Take 1 tablet (20 mEq total) by mouth daily. As a potassium supplement    Dispense:  30 tablet    Refill:  3     Follow-up: Return in about 1 week (around 10/01/2015).  [name], M.D.

## 2015-09-25 ENCOUNTER — Telehealth: Payer: Self-pay | Admitting: Family Medicine

## 2015-09-25 NOTE — Telephone Encounter (Signed)
Hold potassium for now. Recheck level next week. Drop by the day before your appointment.  Nurse : please put in future BMP. Thanks. WS

## 2015-09-25 NOTE — Telephone Encounter (Signed)
Pt notified of Dr Stacks recommendation Verbalizes understanding 

## 2015-10-05 ENCOUNTER — Ambulatory Visit (INDEPENDENT_AMBULATORY_CARE_PROVIDER_SITE_OTHER): Payer: PPO | Admitting: Family Medicine

## 2015-10-05 ENCOUNTER — Encounter: Payer: Self-pay | Admitting: Family Medicine

## 2015-10-05 VITALS — BP 132/78 | HR 85 | Temp 97.3°F | Ht 59.0 in | Wt 163.8 lb

## 2015-10-05 DIAGNOSIS — R609 Edema, unspecified: Secondary | ICD-10-CM

## 2015-10-05 DIAGNOSIS — E876 Hypokalemia: Secondary | ICD-10-CM

## 2015-10-05 DIAGNOSIS — I10 Essential (primary) hypertension: Secondary | ICD-10-CM | POA: Diagnosis not present

## 2015-10-05 NOTE — Progress Notes (Addendum)
Subjective:  Patient ID: [name], female    DOB: May 17, 1925  Age: 82 y.o. MRN: [id number]  CC: Edema   HPI [name] presents for Swelling in ankles No better from last week. Lasix hasn't resolved swelling in spite of dose increase. One day she didn't urinate until 7 PM. Most days goes 2-3 times a day. DOE has resolved. Energy a little better.     follow-up of hypertension. Patient has no history of headache chest pain or shortness of breath or recent cough. Patient also denies symptoms of TIA such as numbness weakness lateralizing. Patient checks  blood pressure at home and has not had any elevated readings recently. Log is attached.   History [name] has a past medical history of Diabetes mellitus without complication ([place]); Hyperlipidemia; Hypertension; Frequent UTI; and Neck pain.   She has past surgical history that includes Appendectomy; Cholecystectomy; Tonsillectomy; and Joint replacement.   Her family history includes Cancer in her brother, father, and mother.She reports that she has never smoked. She has never used smokeless tobacco. She reports that she does not drink alcohol or use illicit drugs.  Current Outpatient Prescriptions on File Prior to Visit  Medication Sig Dispense Refill  . amLODipine (NORVASC) 5 MG tablet Take 1 tablet (5 mg total) by mouth daily. 90 tablet 1  . Aspirin-Caffeine (BAYER BACK & BODY PAIN EX ST PO) Take 1 tablet by mouth daily as needed (pain).     . Calcium Carbonate-Vit D-Min (CALCIUM 1200 PO) Take 1 tablet by mouth every other day.     . Cholecalciferol (VITAMIN D3) 5000 units CAPS Take 1 capsule by mouth daily.    Marland Kitchen diltiazem (CARDIZEM) 120 MG tablet TAKE ONE TABLET BY MOUTH ONCE DAILY 60 tablet 4  . furosemide (LASIX) 40 MG tablet Take 1 tablet (40 mg total) by mouth daily. 30 tablet 1  . gabapentin (NEURONTIN) 100 MG capsule TAKE ONE CAPSULE BY MOUTH ONCE DAILY AT BEDTIME INCREASE  AS  TOLERATED  TO  3  CAPSULES  AT  BEDTIME 90  capsule 1  . Insulin Glargine (LANTUS SOLOSTAR) 100 UNIT/ML Solostar Pen INJECT 30-45 UNITS SUBCUTANEOUSLY ONCE DAILY AT 10 PM as directed. 15 mL 2  . MAGNESIUM-ZINC PO Take 15 mg by mouth 3 (three) times daily.    . meloxicam (MOBIC) 15 MG tablet Take 15 mg by mouth daily.     . metFORMIN (GLUCOPHAGE) 500 MG tablet TAKE ONE TABLET BY MOUTH ONCE DAILY WITH  BREAKFAST 90 tablet 1  . omeprazole (PRILOSEC) 20 MG capsule TAKE ONE CAPSULE BY MOUTH TWICE DAILY BEFORE MEAL(S) 180 capsule 0  . ONE TOUCH ULTRA TEST test strip USE ONE STRIP TO CHECK GLUCOSE TWICE DAILY 100 each 1  . potassium chloride SA (K-DUR,KLOR-CON) 20 MEQ tablet Take 1 tablet (20 mEq total) by mouth daily. As a potassium supplement 30 tablet 3   No current facility-administered medications on file prior to visit.     ROS Review of Systems  Constitutional: Negative for fever, activity change and appetite change.  HENT: Negative for congestion, rhinorrhea and sore throat.   Eyes: Negative for visual disturbance.  Respiratory: Negative for cough, shortness of breath and wheezing.   Cardiovascular: Positive for leg swelling. Negative for chest pain and palpitations.  Gastrointestinal: Negative for nausea, abdominal pain and diarrhea.  Genitourinary: Negative for dysuria.  Musculoskeletal: Negative for myalgias and arthralgias.    Objective:  BP 132/78 mmHg  Pulse 85  Temp(Src) 97.3 F (36.3  C) (Oral)  Ht 4' 11" (1.499 m)  Wt 163 lb 12.8 oz (74.299 kg)  BMI 33.07 kg/m2  SpO2 95%  BP Readings from Last 3 Encounters:  10/05/15 132/78  09/24/15 131/73  09/20/15 129/73    Wt Readings from Last 3 Encounters:  10/05/15 163 lb 12.8 oz (74.299 kg)  09/24/15 164 lb 9.6 oz (74.662 kg)  09/20/15 161 lb (73.029 kg)     Physical Exam  Constitutional: She is oriented to person, place, and time. She appears well-developed and well-nourished. No distress.  HENT:  Head: Normocephalic and atraumatic.  Eyes: EOM are normal.  Pupils are equal, round, and reactive to light.  Neck: Neck supple. No JVD present. No thyromegaly present.  Cardiovascular: Normal rate and regular rhythm.   No murmur heard. Pulmonary/Chest: Breath sounds normal. No respiratory distress. She has no rales.  Musculoskeletal: Normal range of motion. She exhibits edema (2+ BLE at ankles minimal pitting).  Neurological: She is alert and oriented to person, place, and time.  Skin: Skin is warm and dry.  Psychiatric: She has a normal mood and affect. Her behavior is normal. Judgment and thought content normal.     Lab Results  Component Value Date   WBC 6.6 09/20/2015   HGB 13.2 11/14/2014   HCT 42.7 09/20/2015   PLT 268 09/20/2015   GLUCOSE 140* 10/05/2015   CHOL 247* 07/03/2015   TRIG 217* 07/03/2015   HDL 40 07/03/2015   LDLCALC 164* 07/03/2015   ALT 28 07/03/2015   AST 21 07/03/2015   NA 139 10/05/2015   K 4.5 10/05/2015   CL 98 10/05/2015   CREATININE 0.90 10/05/2015   BUN 14 10/05/2015   CO2 23 10/05/2015   TSH 1.420 09/20/2015   INR 1.9* 05/22/2007   HGBA1C 6.8 07/03/2015    US Venous Img Lower Unilateral Left  07/13/2014  CLINICAL DATA:  Edema EXAM: LEFT LOWER EXTREMITY VENOUS DUPLEX ULTRASOUND TECHNIQUE: Doppler venous assessment of the left lower extremity deep venous system was performed, including characterization of spectral flow, compressibility, and phasicity. COMPARISON:  None. FINDINGS: There is complete compressibility of the left common femoral, femoral, and popliteal veins. Doppler analysis demonstrates respiratory phasicity and augmentation of flow upon calf compression. No evidence of superficial vein or calf vein thrombosis. IMPRESSION: No evidence of left lower extremity DVT. Electronically Signed   By: Marybelle Killings M.D.   On: 07/13/2014 14:12    Assessment & Plan:   Aeon was seen today for edema.  Diagnoses and all orders for this visit:  Edema, unspecified type  Hypokalemia -      BMP8+EGFR  Essential hypertension    I am having Ms. Stimpson maintain her Aspirin-Caffeine (BAYER BACK & BODY PAIN EX ST PO), Calcium Carbonate-Vit D-Min (CALCIUM 1200 PO), MAGNESIUM-ZINC PO, diltiazem, gabapentin, ONE TOUCH ULTRA TEST, omeprazole, Insulin Glargine, metFORMIN, Vitamin D3, amLODipine, meloxicam, furosemide, and potassium chloride SA.  Take two lasix a day, one in the morning and one at noon until swelling is gone. Then go back to one daily.   Follow-up: Return in about 2 weeks (around 10/19/2015).  Claretta Fraise, M.D.

## 2015-10-06 LAB — BMP8+EGFR
BUN/Creatinine Ratio: 16 (ref 12–28)
BUN: 14 mg/dL (ref 8–27)
CALCIUM: 9.5 mg/dL (ref 8.7–10.3)
CO2: 23 mmol/L (ref 18–29)
Chloride: 98 mmol/L (ref 96–106)
Creatinine, Ser: 0.9 mg/dL (ref 0.57–1.00)
GFR, EST AFRICAN AMERICAN: 66 mL/min/{1.73_m2} (ref >59)
GFR, EST NON AFRICAN AMERICAN: 58 mL/min/{1.73_m2} — AB (ref >59)
Glucose: 140 mg/dL — ABNORMAL HIGH (ref 65–99)
POTASSIUM: 4.5 mmol/L (ref 3.5–5.2)
Sodium: 139 mmol/L (ref 134–144)

## 2015-10-09 ENCOUNTER — Telehealth: Payer: Self-pay | Admitting: Family Medicine

## 2015-10-09 NOTE — Telephone Encounter (Signed)
Pt informed of instructions on how to take Lasix for her swelling.

## 2015-10-09 NOTE — Telephone Encounter (Signed)
Tell patient she should increase the Lasix to 40 mg 1 at breakfast and one at lunch until her swelling goes away. If the swelling resolves then she can decrease back to 1 daily.

## 2015-10-09 NOTE — Telephone Encounter (Signed)
Spoke with pt regarding bp readings  She thought Furosemide was going to be increased Please review and advise

## 2015-10-09 NOTE — Patient Instructions (Signed)
Take two lasix a day, one in the morning and one at noon until swelling is gone. Then go back to one daily.

## 2015-10-15 ENCOUNTER — Other Ambulatory Visit: Payer: Self-pay | Admitting: Family Medicine

## 2015-10-18 ENCOUNTER — Ambulatory Visit (INDEPENDENT_AMBULATORY_CARE_PROVIDER_SITE_OTHER): Payer: PPO | Admitting: Family Medicine

## 2015-10-18 ENCOUNTER — Encounter: Payer: Self-pay | Admitting: Family Medicine

## 2015-10-18 VITALS — BP 111/74 | HR 82 | Temp 97.4°F | Ht 59.0 in | Wt 162.0 lb

## 2015-10-18 DIAGNOSIS — M1990 Unspecified osteoarthritis, unspecified site: Secondary | ICD-10-CM | POA: Diagnosis not present

## 2015-10-18 DIAGNOSIS — R609 Edema, unspecified: Secondary | ICD-10-CM | POA: Diagnosis not present

## 2015-10-18 DIAGNOSIS — E785 Hyperlipidemia, unspecified: Secondary | ICD-10-CM

## 2015-10-18 DIAGNOSIS — K219 Gastro-esophageal reflux disease without esophagitis: Secondary | ICD-10-CM | POA: Diagnosis not present

## 2015-10-18 DIAGNOSIS — E1142 Type 2 diabetes mellitus with diabetic polyneuropathy: Secondary | ICD-10-CM

## 2015-10-18 DIAGNOSIS — N390 Urinary tract infection, site not specified: Secondary | ICD-10-CM | POA: Diagnosis not present

## 2015-10-18 DIAGNOSIS — I10 Essential (primary) hypertension: Secondary | ICD-10-CM | POA: Diagnosis not present

## 2015-10-18 DIAGNOSIS — R8281 Pyuria: Secondary | ICD-10-CM

## 2015-10-18 LAB — URINALYSIS, COMPLETE
BILIRUBIN UA: NEGATIVE
Glucose, UA: NEGATIVE
KETONES UA: NEGATIVE
NITRITE UA: NEGATIVE
PH UA: 7 (ref 5.0–7.5)
SPEC GRAV UA: 1.015 (ref 1.005–1.030)
UUROB: 0.2 mg/dL (ref 0.2–1.0)

## 2015-10-18 LAB — MICROSCOPIC EXAMINATION

## 2015-10-18 LAB — GLUCOSE HEMOCUE WAIVED: Glu Hemocue Waived: 111 mg/dL — ABNORMAL HIGH (ref 65–99)

## 2015-10-18 LAB — BAYER DCA HB A1C WAIVED: HB A1C (BAYER DCA - WAIVED): 7.2 % — ABNORMAL HIGH (ref <7.0)

## 2015-10-18 MED ORDER — FUROSEMIDE 80 MG PO TABS: 80.0000 mg | ORAL_TABLET | Freq: Every day | ORAL | Status: DC

## 2015-10-18 MED ORDER — AMLODIPINE BESYLATE 2.5 MG PO TABS: 2.5000 mg | ORAL_TABLET | Freq: Every day | ORAL | Status: DC

## 2015-10-18 MED ORDER — CIPROFLOXACIN HCL 250 MG PO TABS: 250.0000 mg | ORAL_TABLET | Freq: Two times a day (BID) | ORAL | Status: DC

## 2015-10-18 NOTE — Progress Notes (Signed)
Subjective:  Patient ID: [name], female    DOB: 1925/12/22  Age: 82 y.o. MRN: [id number]  CC: Edema; Diabetes; and Hypertension   HPI [name] presents for burning with urination and frequency for several days. Denies fever . No flank pain. No nausea, vomiting.  Follow up of diabetes. Glucose running 100-150 mostly. Occasionally higher. Denies hypoglycemia. Meds agree with her. Taking as directed. Following diet with occasional indiscretion.  Follow up for BP. Checking at home. Denies any abnormals. No side effects of meds. Moderate aches and pains of arthritis. Knees are the worst. Leg edema uncomfortable, but not associated with dyspnea, orthopnea or PND  History [name] has a past medical history of Diabetes mellitus without complication ([place]); Hyperlipidemia; Hypertension; Frequent UTI; and Neck pain.   She has past surgical history that includes Appendectomy; Cholecystectomy; Tonsillectomy; and Joint replacement.   Her family history includes Cancer in her brother, father, and mother.She reports that she has never smoked. She has never used smokeless tobacco. She reports that she does not drink alcohol or use illicit drugs.    ROS Review of Systems  Constitutional: Negative for fever, chills and diaphoresis.  HENT: Negative for congestion.   Eyes: Negative for visual disturbance.  Respiratory: Negative for cough and shortness of breath.   Cardiovascular: Negative for chest pain and palpitations.  Gastrointestinal: Negative for nausea, diarrhea and constipation.  Genitourinary: Positive for dysuria, urgency and frequency. Negative for hematuria, flank pain, decreased urine volume, menstrual problem and pelvic pain.  Musculoskeletal: Negative for joint swelling and arthralgias.  Skin: Negative for rash.  Neurological: Negative for dizziness and numbness.    Objective:  BP 111/74 mmHg  Pulse 82  Temp(Src) 97.4 F (36.3 C) (Oral)  Ht 4' 11" (1.499 m)  Wt  162 lb (73.483 kg)  BMI 32.70 kg/m2  SpO2 96%  BP Readings from Last 3 Encounters:  10/18/15 111/74  10/05/15 132/78  09/24/15 131/73    Wt Readings from Last 3 Encounters:  10/18/15 162 lb (73.483 kg)  10/05/15 163 lb 12.8 oz (74.299 kg)  09/24/15 164 lb 9.6 oz (74.662 kg)     Physical Exam  Constitutional: She is oriented to person, place, and time. She appears well-developed and well-nourished.  HENT:  Head: Normocephalic and atraumatic.  Cardiovascular: Normal rate and regular rhythm.   No murmur heard. Pulmonary/Chest: Effort normal and breath sounds normal.  Abdominal: Soft. Bowel sounds are normal. She exhibits no mass. There is no tenderness. There is no rebound and no guarding.  Musculoskeletal: She exhibits no tenderness.  Neurological: She is alert and oriented to person, place, and time.  Skin: Skin is warm and dry.  Psychiatric: She has a normal mood and affect. Her behavior is normal.     Lab Results  Component Value Date   WBC 8.9 10/18/2015   HGB 13.2 11/14/2014   HCT 40.6 10/18/2015   PLT 264 10/18/2015   GLUCOSE 107* 10/18/2015   CHOL 247* 07/03/2015   TRIG 217* 07/03/2015   HDL 40 07/03/2015   LDLCALC 164* 07/03/2015   ALT 24 10/18/2015   AST 21 10/18/2015   NA 140 10/18/2015   K 4.0 10/18/2015   CL 97 10/18/2015   CREATININE 1.06* 10/18/2015   BUN 22 10/18/2015   CO2 24 10/18/2015   TSH 1.420 09/20/2015   INR 1.9* 05/22/2007   HGBA1C 6.8 07/03/2015     Assessment & Plan:   [name] was seen today for edema, diabetes and hypertension.  Diagnoses and all orders for this visit:  Arthritis -     CBC with Differential/Platelet -     CMP14+EGFR  Diabetic polyneuropathy associated with type 2 diabetes mellitus ([facility]) -     CBC with Differential/Platelet -     CMP14+EGFR -     Bayer DCA Hb A1c Waived -     Glucose Hemocue Waived  Essential hypertension -     CBC with Differential/Platelet -     CMP14+EGFR -     amLODipine (NORVASC) 2.5  MG tablet; Take 1 tablet (2.5 mg total) by mouth daily.  Gastroesophageal reflux disease without esophagitis -     CBC with Differential/Platelet -     CMP14+EGFR  Hyperlipemia -     CBC with Differential/Platelet -     CMP14+EGFR  Osteoarthritis, unspecified osteoarthritis type, unspecified site -     CBC with Differential/Platelet -     CMP14+EGFR  Edema, unspecified type -     furosemide (LASIX) 80 MG tablet; Take 1 tablet (80 mg total) by mouth daily.  Pyuria -     ciprofloxacin (CIPRO) 250 MG tablet; Take 1 tablet (250 mg total) by mouth 2 (two) times daily. -     Urine culture -     Urinalysis, Complete -     Microscopic Examination    I have changed [name]'s amLODipine and furosemide. I am also having her start on ciprofloxacin. Additionally, I am having her maintain her Aspirin-Caffeine (BAYER BACK & BODY PAIN EX ST PO), Calcium Carbonate-Vit D-Min (CALCIUM 1200 PO), MAGNESIUM-ZINC PO, gabapentin, ONE TOUCH ULTRA TEST, omeprazole, Insulin Glargine, metFORMIN, Vitamin D3, meloxicam, potassium chloride SA, and diltiazem.  Meds ordered this encounter  Medications  . amLODipine (NORVASC) 2.5 MG tablet    Sig: Take 1 tablet (2.5 mg total) by mouth daily.    Dispense:  30 tablet    Refill:  5  . furosemide (LASIX) 80 MG tablet    Sig: Take 1 tablet (80 mg total) by mouth daily.    Dispense:  30 tablet    Refill:  5  . ciprofloxacin (CIPRO) 250 MG tablet    Sig: Take 1 tablet (250 mg total) by mouth 2 (two) times daily.    Dispense:  10 tablet    Refill:  0     Follow-up: Return in about 3 months (around 01/18/2016).  Claretta Fraise, M.D.

## 2015-10-19 LAB — CBC WITH DIFFERENTIAL/PLATELET
Basophils Absolute: 0.1 10*3/uL (ref 0.0–0.2)
Basos: 1 %
EOS (ABSOLUTE): 0.4 10*3/uL (ref 0.0–0.4)
Eos: 4 %
HEMATOCRIT: 40.6 % (ref 34.0–46.6)
Hemoglobin: 12.8 g/dL (ref 11.1–15.9)
Immature Grans (Abs): 0 10*3/uL (ref 0.0–0.1)
Immature Granulocytes: 0 %
LYMPHS ABS: 1.3 10*3/uL (ref 0.7–3.1)
LYMPHS: 14 %
MCH: 26 pg — AB (ref 26.6–33.0)
MCHC: 31.5 g/dL (ref 31.5–35.7)
MCV: 82 fL (ref 79–97)
MONOCYTES: 8 %
MONOS ABS: 0.7 10*3/uL (ref 0.1–0.9)
NEUTROS ABS: 6.4 10*3/uL (ref 1.4–7.0)
NEUTROS PCT: 73 %
Platelets: 264 10*3/uL (ref 150–379)
RBC: 4.93 x10E6/uL (ref 3.77–5.28)
RDW: 16.3 % — AB (ref 12.3–15.4)
WBC: 8.9 10*3/uL (ref 3.4–10.8)

## 2015-10-19 LAB — CMP14+EGFR
A/G RATIO: 1.4 (ref 1.2–2.2)
ALK PHOS: 71 IU/L (ref 39–117)
ALT: 24 IU/L (ref 0–32)
AST: 21 IU/L (ref 0–40)
Albumin: 4.1 g/dL (ref 3.5–4.7)
BUN/Creatinine Ratio: 21 (ref 12–28)
BUN: 22 mg/dL (ref 8–27)
Bilirubin Total: <0.2 mg/dL (ref 0.0–1.2)
CO2: 24 mmol/L (ref 18–29)
Calcium: 9.5 mg/dL (ref 8.7–10.3)
Chloride: 97 mmol/L (ref 96–106)
Creatinine, Ser: 1.06 mg/dL — ABNORMAL HIGH (ref 0.57–1.00)
GFR calc Af Amer: 55 mL/min/{1.73_m2} — ABNORMAL LOW (ref >59)
GFR, EST NON AFRICAN AMERICAN: 47 mL/min/{1.73_m2} — AB (ref >59)
GLOBULIN, TOTAL: 2.9 g/dL (ref 1.5–4.5)
Glucose: 107 mg/dL — ABNORMAL HIGH (ref 65–99)
POTASSIUM: 4 mmol/L (ref 3.5–5.2)
SODIUM: 140 mmol/L (ref 134–144)
Total Protein: 7 g/dL (ref 6.0–8.5)

## 2015-10-20 LAB — URINE CULTURE

## 2015-10-23 ENCOUNTER — Other Ambulatory Visit: Payer: Self-pay | Admitting: *Deleted

## 2015-10-23 ENCOUNTER — Other Ambulatory Visit: Payer: Self-pay | Admitting: Family Medicine

## 2015-10-23 NOTE — Telephone Encounter (Signed)
-----   Message from Claretta Fraise, MD sent at 10/22/2015  4:27 PM EDT ----- Your urine culture confirmed the presence of an infection. The medication that was prescribed should help.  Best regards, Claretta Fraise

## 2015-10-24 NOTE — Telephone Encounter (Signed)
Last seen 10/18/15 Dr Livia Snellen

## 2015-11-14 ENCOUNTER — Encounter: Payer: Self-pay | Admitting: Family Medicine

## 2015-11-14 ENCOUNTER — Ambulatory Visit (INDEPENDENT_AMBULATORY_CARE_PROVIDER_SITE_OTHER): Payer: PPO | Admitting: Family Medicine

## 2015-11-14 VITALS — BP 114/61 | HR 64 | Temp 97.6°F | Ht 59.0 in | Wt 160.2 lb

## 2015-11-14 DIAGNOSIS — Z2239 Carrier of other specified bacterial diseases: Secondary | ICD-10-CM

## 2015-11-14 DIAGNOSIS — N390 Urinary tract infection, site not specified: Secondary | ICD-10-CM | POA: Diagnosis not present

## 2015-11-14 DIAGNOSIS — Z23 Encounter for immunization: Secondary | ICD-10-CM | POA: Diagnosis not present

## 2015-11-14 LAB — MICROSCOPIC EXAMINATION: RBC MICROSCOPIC, UA: NONE SEEN /HPF (ref 0–?)

## 2015-11-14 LAB — URINALYSIS, COMPLETE
BILIRUBIN UA: NEGATIVE
GLUCOSE, UA: NEGATIVE
KETONES UA: NEGATIVE
NITRITE UA: NEGATIVE
PROTEIN UA: NEGATIVE
RBC UA: NEGATIVE
Specific Gravity, UA: 1.015 (ref 1.005–1.030)
UUROB: 0.2 mg/dL (ref 0.2–1.0)
pH, UA: 6.5 (ref 5.0–7.5)

## 2015-11-14 NOTE — Progress Notes (Signed)
Subjective:  Patient ID: [name], female    DOB: 01-20-1926  Age: 82 y.o. MRN: [id number]  CC: Urinary Tract Infection   HPI [name] presents for recheck of UTI. Denies sx.No frequency or dysuria since starting cipro. Follow up of diabetes. Glucose running 100-150 mostly. Occasionally higher. Denies hypoglycemia. Meds agree with her. Taking as directed. Following diet with occasional indiscretion. Log sheet reviewed, attached  Follow up for BP. Checking at home. Denies any abnormals. No side effects of meds. Moderate aches and pains of arthritis. Knees are the worst. Leg edema uncomfortable, but not associated with dyspnea, orthopnea or PND  History [name] has a past medical history of Diabetes mellitus without complication ([place]); Hyperlipidemia; Hypertension; Frequent UTI; and Neck pain.   She has past surgical history that includes Appendectomy; Cholecystectomy; Tonsillectomy; and Joint replacement.   Her family history includes Cancer in her brother, father, and mother.She reports that she has never smoked. She has never used smokeless tobacco. She reports that she does not drink alcohol or use illicit drugs.    ROS Review of Systems  Constitutional: Negative for fever, chills and diaphoresis.  HENT: Negative for congestion.   Eyes: Negative for visual disturbance.  Respiratory: Negative for cough and shortness of breath.   Cardiovascular: Negative for chest pain and palpitations.  Gastrointestinal: Negative for nausea, diarrhea and constipation.  Genitourinary: Negative for dysuria, urgency, frequency, hematuria, flank pain, decreased urine volume, menstrual problem and pelvic pain.  Musculoskeletal: Negative for joint swelling and arthralgias.  Skin: Negative for rash.  Neurological: Negative for dizziness and numbness.    Objective:  BP 114/61 mmHg  Pulse 64  Temp(Src) 97.6 F (36.4 C) (Oral)  Ht 4\' 11"  (1.499 m)  Wt 160 lb 3.2 oz (72.666 kg)  BMI  32.34 kg/m2  SpO2 96%  BP Readings from Last 3 Encounters:  11/14/15 114/61  10/18/15 111/74  10/05/15 132/78    Wt Readings from Last 3 Encounters:  11/14/15 160 lb 3.2 oz (72.666 kg)  10/18/15 162 lb (73.483 kg)  10/05/15 163 lb 12.8 oz (74.299 kg)     Physical Exam  Constitutional: She is oriented to person, place, and time. She appears well-developed and well-nourished.  HENT:  Head: Normocephalic and atraumatic.  Cardiovascular: Normal rate and regular rhythm.   No murmur heard. Pulmonary/Chest: Effort normal and breath sounds normal.  Abdominal: Soft. Bowel sounds are normal. She exhibits no mass. There is no tenderness. There is no rebound and no guarding.  Musculoskeletal: She exhibits no tenderness.  Neurological: She is alert and oriented to person, place, and time.  Skin: Skin is warm and dry.  Psychiatric: She has a normal mood and affect. Her behavior is normal.     Lab Results  Component Value Date   WBC 8.9 10/18/2015   HGB 13.2 11/14/2014   HCT 40.6 10/18/2015   PLT 264 10/18/2015   GLUCOSE 107* 10/18/2015   CHOL 247* 07/03/2015   TRIG 217* 07/03/2015   HDL 40 07/03/2015   LDLCALC 164* 07/03/2015   ALT 24 10/18/2015   AST 21 10/18/2015   NA 140 10/18/2015   K 4.0 10/18/2015   CL 97 10/18/2015   CREATININE 1.06* 10/18/2015   BUN 22 10/18/2015   CO2 24 10/18/2015   TSH 1.420 09/20/2015   INR 1.9* 05/22/2007   HGBA1C 6.8 07/03/2015     Assessment & Plan:   Neera was seen today for urinary tract infection.  Diagnoses and all orders for  this visit:  Colonization with drug-resistant bacteria  Urinary tract infection without hematuria, site unspecified -     Urinalysis, Complete -     Urine culture  Other orders -     Microscopic Examination -     Pneumococcal polysaccharide vaccine 23-valent greater than or equal to 2yo subcutaneous/IM    I am having Ms. Bernier maintain her Aspirin-Caffeine (BAYER BACK & BODY PAIN EX ST PO),  Calcium Carbonate-Vit D-Min (CALCIUM 1200 PO), MAGNESIUM-ZINC PO, gabapentin, ONE TOUCH ULTRA TEST, omeprazole, Insulin Glargine, metFORMIN, Vitamin D3, meloxicam, potassium chloride SA, diltiazem, amLODipine, furosemide, and ciprofloxacin.  No orders of the defined types were placed in this encounter.     Follow-up: Return in about 3 months (around 02/14/2016).  Claretta Fraise, M.D.

## 2015-11-15 DIAGNOSIS — L84 Corns and callosities: Secondary | ICD-10-CM | POA: Diagnosis not present

## 2015-11-15 DIAGNOSIS — B351 Tinea unguium: Secondary | ICD-10-CM | POA: Diagnosis not present

## 2015-11-15 DIAGNOSIS — E1151 Type 2 diabetes mellitus with diabetic peripheral angiopathy without gangrene: Secondary | ICD-10-CM | POA: Diagnosis not present

## 2015-11-16 ENCOUNTER — Other Ambulatory Visit: Payer: PPO

## 2015-11-16 DIAGNOSIS — N390 Urinary tract infection, site not specified: Secondary | ICD-10-CM | POA: Diagnosis not present

## 2015-11-16 LAB — URINE CULTURE

## 2015-11-18 LAB — URINE CULTURE

## 2015-11-22 ENCOUNTER — Other Ambulatory Visit: Payer: PPO

## 2015-11-22 DIAGNOSIS — R300 Dysuria: Secondary | ICD-10-CM | POA: Diagnosis not present

## 2015-11-22 DIAGNOSIS — N390 Urinary tract infection, site not specified: Secondary | ICD-10-CM | POA: Diagnosis not present

## 2015-11-22 LAB — URINALYSIS, COMPLETE
Bilirubin, UA: NEGATIVE
Glucose, UA: NEGATIVE
Ketones, UA: NEGATIVE
NITRITE UA: POSITIVE — AB
PH UA: 8.5 — AB (ref 5.0–7.5)
Specific Gravity, UA: 1.015 (ref 1.005–1.030)
Urobilinogen, Ur: 0.2 mg/dL (ref 0.2–1.0)

## 2015-11-22 LAB — MICROSCOPIC EXAMINATION: WBC, UA: >30 /hpf — AB (ref 0–?)

## 2015-11-22 MED ORDER — CIPROFLOXACIN HCL 250 MG PO TABS: 250.0000 mg | ORAL_TABLET | Freq: Two times a day (BID) | ORAL | Status: DC

## 2015-11-23 ENCOUNTER — Other Ambulatory Visit: Payer: Self-pay | Admitting: Family Medicine

## 2015-11-23 ENCOUNTER — Telehealth: Payer: Self-pay | Admitting: *Deleted

## 2015-11-23 MED ORDER — NITROFURANTOIN MONOHYD MACRO 100 MG PO CAPS: 100.0000 mg | ORAL_CAPSULE | Freq: Two times a day (BID) | ORAL | Status: DC

## 2015-11-23 NOTE — Telephone Encounter (Signed)
Pt notified of results Per Dr Evette Doffing, she was started on Cipro on 11/22/2015

## 2015-11-23 NOTE — Telephone Encounter (Signed)
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

## 2015-11-24 LAB — URINE CULTURE

## 2015-11-26 ENCOUNTER — Other Ambulatory Visit: Payer: Self-pay | Admitting: Family Medicine

## 2015-11-26 ENCOUNTER — Telehealth: Payer: Self-pay | Admitting: *Deleted

## 2015-11-26 MED ORDER — AMOXICILLIN-POT CLAVULANATE 875-125 MG PO TABS: 1.0000 | ORAL_TABLET | Freq: Two times a day (BID) | ORAL | 0 refills | Status: DC

## 2015-11-26 NOTE — Telephone Encounter (Signed)
Pt is having urinary symptoms Currently taking Macrobid Please review culture to advise of new antibiotic

## 2015-11-28 ENCOUNTER — Ambulatory Visit: Payer: PPO | Admitting: Family Medicine

## 2015-12-01 ENCOUNTER — Other Ambulatory Visit: Payer: Self-pay | Admitting: Pediatrics

## 2015-12-01 ENCOUNTER — Other Ambulatory Visit: Payer: Self-pay | Admitting: Family Medicine

## 2015-12-17 ENCOUNTER — Ambulatory Visit (INDEPENDENT_AMBULATORY_CARE_PROVIDER_SITE_OTHER): Payer: PPO | Admitting: Family Medicine

## 2015-12-17 ENCOUNTER — Encounter: Payer: Self-pay | Admitting: Family Medicine

## 2015-12-17 VITALS — BP 127/77 | HR 85 | Temp 97.5°F | Ht 59.0 in | Wt 162.0 lb

## 2015-12-17 DIAGNOSIS — I10 Essential (primary) hypertension: Secondary | ICD-10-CM | POA: Diagnosis not present

## 2015-12-17 DIAGNOSIS — E1142 Type 2 diabetes mellitus with diabetic polyneuropathy: Secondary | ICD-10-CM | POA: Diagnosis not present

## 2015-12-17 DIAGNOSIS — N390 Urinary tract infection, site not specified: Secondary | ICD-10-CM

## 2015-12-17 LAB — MICROSCOPIC EXAMINATION

## 2015-12-17 LAB — URINALYSIS, COMPLETE
Bilirubin, UA: NEGATIVE
GLUCOSE, UA: NEGATIVE
Nitrite, UA: NEGATIVE
SPEC GRAV UA: 1.02 (ref 1.005–1.030)
Urobilinogen, Ur: 0.2 mg/dL (ref 0.2–1.0)
pH, UA: 7 (ref 5.0–7.5)

## 2015-12-17 MED ORDER — MIRABEGRON ER 25 MG PO TB24: 25.0000 mg | ORAL_TABLET | Freq: Every day | ORAL | 5 refills | Status: DC

## 2015-12-17 MED ORDER — MIRABEGRON ER 25 MG PO TB24: 25.0000 mg | ORAL_TABLET | Freq: Every day | ORAL | Status: DC

## 2015-12-17 MED ORDER — AMOXICILLIN-POT CLAVULANATE 875-125 MG PO TABS: 1.0000 | ORAL_TABLET | Freq: Two times a day (BID) | ORAL | 0 refills | Status: DC

## 2015-12-17 NOTE — Progress Notes (Signed)
Subjective:  Patient ID: [name], female    DOB: January 02, 1926  Age: 82 y.o. MRN: [id number]  CC: Urinary Tract Infection   HPI [name] presents for burning with urination and frequency for 2l days. Denies fever . No flank pain. No nausea, vomiting.    History [name] has a past medical history of Diabetes mellitus without complication ([place]); Frequent UTI; Hyperlipidemia; Hypertension; and Neck pain.   She has a past surgical history that includes Appendectomy; Cholecystectomy; Tonsillectomy; and Joint replacement.   Her family history includes Cancer in her brother, father, and mother.She reports that she has never smoked. She has never used smokeless tobacco. She reports that she does not drink alcohol or use drugs.    ROS Review of Systems  Constitutional: Negative for activity change, appetite change, chills, diaphoresis and fever.  HENT: Negative for congestion, rhinorrhea and sore throat.   Eyes: Negative for visual disturbance.  Respiratory: Negative for cough and shortness of breath.   Cardiovascular: Negative for chest pain and palpitations.  Gastrointestinal: Negative for abdominal pain, constipation, diarrhea and nausea.  Genitourinary: Positive for dysuria, frequency and urgency. Negative for decreased urine volume, flank pain, hematuria, menstrual problem and pelvic pain.  Musculoskeletal: Negative for arthralgias, joint swelling and myalgias.  Skin: Negative for rash.  Neurological: Negative for dizziness and numbness.    Objective:  BP 127/77 (BP Location: Left Arm, Patient Position: Sitting, Cuff Size: Normal)   Pulse 85   Temp 97.5 F (36.4 C) (Oral)   Ht 4\' 11"  (1.499 m)   Wt 162 lb (73.5 kg)   SpO2 97%   BMI 32.72 kg/m   BP Readings from Last 3 Encounters:  12/17/15 127/77  11/14/15 114/61  10/18/15 111/74    Wt Readings from Last 3 Encounters:  12/17/15 162 lb (73.5 kg)  11/14/15 160 lb 3.2 oz (72.7 kg)  10/18/15 162 lb (73.5 kg)       Physical Exam  Constitutional: She is oriented to person, place, and time. She appears well-developed and well-nourished.  HENT:  Head: Normocephalic and atraumatic.  Eyes: Conjunctivae are normal. Pupils are equal, round, and reactive to light.  Cardiovascular: Normal rate and regular rhythm.   No murmur heard. Pulmonary/Chest: Effort normal and breath sounds normal.  Abdominal: Soft. Bowel sounds are normal. She exhibits no mass. There is no tenderness. There is no rebound and no guarding.  Musculoskeletal: She exhibits no tenderness.  Neurological: She is alert and oriented to person, place, and time.  Skin: Skin is warm and dry.  Psychiatric: She has a normal mood and affect. Her behavior is normal.     Lab Results  Component Value Date   WBC 8.9 10/18/2015   HGB 13.2 11/14/2014   HCT 40.6 10/18/2015   PLT 264 10/18/2015   GLUCOSE 107 (H) 10/18/2015   CHOL 247 (H) 07/03/2015   TRIG 217 (H) 07/03/2015   HDL 40 07/03/2015   LDLCALC 164 (H) 07/03/2015   ALT 24 10/18/2015   AST 21 10/18/2015   NA 140 10/18/2015   K 4.0 10/18/2015   CL 97 10/18/2015   CREATININE 1.06 (H) 10/18/2015   BUN 22 10/18/2015   CO2 24 10/18/2015   TSH 1.420 09/20/2015   INR 1.9 (H) 05/22/2007   HGBA1C 6.8 07/03/2015    US Venous Img Lower Unilateral Left  Result Date: 07/13/2014 CLINICAL DATA:  Edema EXAM: LEFT LOWER EXTREMITY VENOUS DUPLEX ULTRASOUND TECHNIQUE: Doppler venous assessment of the left lower extremity deep  venous system was performed, including characterization of spectral flow, compressibility, and phasicity. COMPARISON:  None. FINDINGS: There is complete compressibility of the left common femoral, femoral, and popliteal veins. Doppler analysis demonstrates respiratory phasicity and augmentation of flow upon calf compression. No evidence of superficial vein or calf vein thrombosis. IMPRESSION: No evidence of left lower extremity DVT. Electronically Signed   By: Marybelle Killings  M.D.   On: 07/13/2014 14:12    Assessment & Plan:   [name] was seen today for urinary tract infection.  Diagnoses and all orders for this visit:  Urinary tract infection without hematuria, site unspecified -     Urinalysis, Complete -     Urine culture -     Discontinue: mirabegron ER (MYRBETRIQ) tablet 25 mg; Take 1 tablet (25 mg total) by mouth daily.  Essential hypertension  Diabetic polyneuropathy associated with type 2 diabetes mellitus ([place])  Other orders -     amoxicillin-clavulanate (AUGMENTIN) 875-125 MG tablet; Take 1 tablet by mouth 2 (two) times daily. Take all of this medication -     mirabegron ER (MYRBETRIQ) 25 MG TB24 tablet; Take 1 tablet (25 mg total) by mouth daily.     I am having [name] start on mirabegron ER. I am also having her maintain her Aspirin-Caffeine (BAYER BACK & BODY PAIN EX ST PO), Calcium Carbonate-Vit D-Min (CALCIUM 1200 PO), MAGNESIUM-ZINC PO, ONE TOUCH ULTRA TEST, Insulin Glargine, metFORMIN, Vitamin D3, meloxicam, potassium chloride SA, diltiazem, amLODipine, furosemide, omeprazole, gabapentin, and amoxicillin-clavulanate.  Meds ordered this encounter  Medications  . amoxicillin-clavulanate (AUGMENTIN) 875-125 MG tablet    Sig: Take 1 tablet by mouth 2 (two) times daily. Take all of this medication    Dispense:  20 tablet    Refill:  0  . DISCONTD: mirabegron ER (MYRBETRIQ) tablet 25 mg  . mirabegron ER (MYRBETRIQ) 25 MG TB24 tablet    Sig: Take 1 tablet (25 mg total) by mouth daily.    Dispense:  30 tablet    Refill:  5     Follow-up: No Follow-up on file.  Claretta Fraise, M.D.

## 2015-12-19 LAB — URINE CULTURE

## 2015-12-20 ENCOUNTER — Telehealth: Payer: Self-pay | Admitting: Family Medicine

## 2015-12-20 NOTE — Telephone Encounter (Signed)
Spoke with pt regarding urine culture She is currently on antibiotic Has follow up in 6 wks She will call with worsening symptoms

## 2015-12-20 NOTE — Telephone Encounter (Signed)
Pt notified prior authorization pending

## 2015-12-28 ENCOUNTER — Telehealth: Payer: Self-pay | Admitting: Family Medicine

## 2015-12-28 NOTE — Telephone Encounter (Signed)
FYI -- better.

## 2015-12-31 ENCOUNTER — Other Ambulatory Visit: Payer: Self-pay | Admitting: Family Medicine

## 2015-12-31 ENCOUNTER — Telehealth: Payer: Self-pay

## 2015-12-31 MED ORDER — SOLIFENACIN SUCCINATE 10 MG PO TABS: 10.0000 mg | ORAL_TABLET | Freq: Every day | ORAL | 2 refills | Status: DC

## 2015-12-31 NOTE — Telephone Encounter (Signed)
Patient aware of change of medication

## 2015-12-31 NOTE — Telephone Encounter (Signed)
I subtituted Vesicare. It has been sent to the pharmacy.  Please let the patient know. Thanks, WS

## 2016-01-08 ENCOUNTER — Other Ambulatory Visit: Payer: Self-pay | Admitting: Family Medicine

## 2016-01-08 ENCOUNTER — Telehealth: Payer: Self-pay | Admitting: Physician Assistant

## 2016-01-08 MED ORDER — GABAPENTIN 100 MG PO CAPS: ORAL_CAPSULE | ORAL | 0 refills | Status: DC

## 2016-01-08 NOTE — Telephone Encounter (Signed)
90 day supply sent to [facility]

## 2016-01-09 ENCOUNTER — Telehealth: Payer: Self-pay | Admitting: Family Medicine

## 2016-01-09 NOTE — Telephone Encounter (Signed)
Pt having "cloudy" urination after finishing antibiotic Scheduled appt

## 2016-01-11 ENCOUNTER — Encounter: Payer: Self-pay | Admitting: Family Medicine

## 2016-01-11 ENCOUNTER — Ambulatory Visit (INDEPENDENT_AMBULATORY_CARE_PROVIDER_SITE_OTHER): Payer: PPO | Admitting: Family Medicine

## 2016-01-11 VITALS — BP 148/78 | HR 73 | Temp 97.8°F | Ht 59.0 in | Wt 162.0 lb

## 2016-01-11 DIAGNOSIS — R300 Dysuria: Secondary | ICD-10-CM

## 2016-01-11 DIAGNOSIS — N3090 Cystitis, unspecified without hematuria: Secondary | ICD-10-CM

## 2016-01-11 LAB — MICROSCOPIC EXAMINATION: WBC, UA: >30 /hpf — AB (ref 0–?)

## 2016-01-11 LAB — URINALYSIS, COMPLETE
Bilirubin, UA: NEGATIVE
GLUCOSE, UA: NEGATIVE
KETONES UA: NEGATIVE
NITRITE UA: POSITIVE — AB
SPEC GRAV UA: 1.01 (ref 1.005–1.030)
UUROB: 0.2 mg/dL (ref 0.2–1.0)
pH, UA: >9 — ABNORMAL HIGH (ref 5.0–7.5)

## 2016-01-11 MED ORDER — INSULIN GLARGINE 100 UNIT/ML SOLOSTAR PEN: PEN_INJECTOR | SUBCUTANEOUS | 2 refills | Status: DC

## 2016-01-11 MED ORDER — AMOXICILLIN-POT CLAVULANATE 875-125 MG PO TABS: 1.0000 | ORAL_TABLET | Freq: Two times a day (BID) | ORAL | 0 refills | Status: AC

## 2016-01-11 NOTE — Progress Notes (Signed)
Subjective:  Patient ID: [name], female    DOB: [birth year]  Age: 82 y.o. MRN: [id number]  CC: Urinary Tract Infection (pt here today c/o dysuria, cloudy urine.)   HPI [name] presents for burning with urination and frequency for several days. Denies fever . No flank pain. No nausea, vomiting. Multiple infections. Occurring more frequently.    History Chasmine has a past medical history of Diabetes mellitus without complication ([place]); Frequent UTI; Hyperlipidemia; Hypertension; and Neck pain.   She has a past surgical history that includes Appendectomy; Cholecystectomy; Tonsillectomy; and Joint replacement.   Her family history includes Cancer in her brother, father, and mother.She reports that she has never smoked. She has never used smokeless tobacco. She reports that she does not drink alcohol or use drugs.    ROS Review of Systems  Constitutional: Negative for chills, diaphoresis and fever.  HENT: Negative for congestion.   Eyes: Negative for visual disturbance.  Respiratory: Negative for cough and shortness of breath.   Cardiovascular: Negative for chest pain and palpitations.  Gastrointestinal: Negative for constipation, diarrhea and nausea.  Genitourinary: Positive for dysuria, frequency and urgency. Negative for decreased urine volume, flank pain, hematuria, menstrual problem and pelvic pain.  Musculoskeletal: Negative for arthralgias and joint swelling.  Skin: Negative for rash.  Neurological: Negative for dizziness and numbness.    Objective:  BP (!) 148/78   Pulse 73   Temp 97.8 F (36.6 C) (Oral)   Ht 4\' 11"  (1.499 m)   Wt 162 lb (73.5 kg)   BMI 32.72 kg/m   BP Readings from Last 3 Encounters:  01/11/16 (!) 148/78  12/17/15 127/77  11/14/15 114/61    Wt Readings from Last 3 Encounters:  01/11/16 162 lb (73.5 kg)  12/17/15 162 lb (73.5 kg)  11/14/15 160 lb 3.2 oz (72.7 kg)     Physical Exam  Constitutional: She is oriented to person,  place, and time. She appears well-developed and well-nourished.  HENT:  Head: Normocephalic and atraumatic.  Cardiovascular: Normal rate and regular rhythm.   No murmur heard. Pulmonary/Chest: Effort normal and breath sounds normal.  Abdominal: Soft. Bowel sounds are normal. She exhibits no mass. There is no tenderness. There is no rebound and no guarding.  Musculoskeletal: She exhibits no tenderness.  Neurological: She is alert and oriented to person, place, and time.  Skin: Skin is warm and dry.  Psychiatric: She has a normal mood and affect. Her behavior is normal.     Lab Results  Component Value Date   WBC 8.9 10/18/2015   HGB 13.2 11/14/2014   HCT 40.6 10/18/2015   PLT 264 10/18/2015   GLUCOSE 107 (H) 10/18/2015   CHOL 247 (H) 07/03/2015   TRIG 217 (H) 07/03/2015   HDL 40 07/03/2015   LDLCALC 164 (H) 07/03/2015   ALT 24 10/18/2015   AST 21 10/18/2015   NA 140 10/18/2015   K 4.0 10/18/2015   CL 97 10/18/2015   CREATININE 1.06 (H) 10/18/2015   BUN 22 10/18/2015   CO2 24 10/18/2015   TSH 1.420 09/20/2015   INR 1.9 (H) 05/22/2007   HGBA1C 6.8 07/03/2015   MICROALBUR neg 07/15/2013    US Venous Img Lower Unilateral Left  Result Date: 07/13/2014 CLINICAL DATA:  Edema EXAM: LEFT LOWER EXTREMITY VENOUS DUPLEX ULTRASOUND TECHNIQUE: Doppler venous assessment of the left lower extremity deep venous system was performed, including characterization of spectral flow, compressibility, and phasicity. COMPARISON:  None. FINDINGS: There is complete compressibility  of the left common femoral, femoral, and popliteal veins. Doppler analysis demonstrates respiratory phasicity and augmentation of flow upon calf compression. No evidence of superficial vein or calf vein thrombosis. IMPRESSION: No evidence of left lower extremity DVT. Electronically Signed   By: Marybelle Killings M.D.   On: 07/13/2014 14:12    Assessment & Plan:   Miya was seen today for urinary tract infection.  Diagnoses and  all orders for this visit:  Dysuria -     Urinalysis, Complete  Cystitis  Other orders -     Insulin Glargine (LANTUS SOLOSTAR) 100 UNIT/ML Solostar Pen; INJECT 30-45 UNITS SUBCUTANEOUSLY ONCE DAILY AT 10 PM as directed. -     amoxicillin-clavulanate (AUGMENTIN) 875-125 MG tablet; Take 1 tablet by mouth 2 (two) times daily. For 30 days - Take all of this medication -     Microscopic Examination     Meds ordered this encounter  Medications  . Insulin Glargine (LANTUS SOLOSTAR) 100 UNIT/ML Solostar Pen    Sig: INJECT 30-45 UNITS SUBCUTANEOUSLY ONCE DAILY AT 10 PM as directed.    Dispense:  15 mL    Refill:  2  . amoxicillin-clavulanate (AUGMENTIN) 875-125 MG tablet    Sig: Take 1 tablet by mouth 2 (two) times daily. For 30 days - Take all of this medication    Dispense:  60 tablet    Refill:  0     Follow-up: Return in about 1 month (around 02/10/2016).  Claretta Fraise, M.D.

## 2016-01-15 ENCOUNTER — Other Ambulatory Visit: Payer: Self-pay | Admitting: Family Medicine

## 2016-01-16 ENCOUNTER — Other Ambulatory Visit: Payer: Self-pay | Admitting: Family Medicine

## 2016-01-24 DIAGNOSIS — B351 Tinea unguium: Secondary | ICD-10-CM | POA: Diagnosis not present

## 2016-01-24 DIAGNOSIS — E1142 Type 2 diabetes mellitus with diabetic polyneuropathy: Secondary | ICD-10-CM | POA: Diagnosis not present

## 2016-01-24 DIAGNOSIS — L84 Corns and callosities: Secondary | ICD-10-CM | POA: Diagnosis not present

## 2016-02-04 ENCOUNTER — Telehealth: Payer: Self-pay | Admitting: Family Medicine

## 2016-02-04 NOTE — Telephone Encounter (Signed)
Okay at 50 mg daily for 6 mos. Thanks ws

## 2016-02-04 NOTE — Telephone Encounter (Signed)
Patient is requesting myrbetriq samples but I do not see on Medication list. Please advise

## 2016-02-05 NOTE — Telephone Encounter (Signed)
Pt notified of samples Samples to front for pt pick up 

## 2016-02-18 ENCOUNTER — Encounter: Payer: Self-pay | Admitting: Family Medicine

## 2016-02-18 ENCOUNTER — Ambulatory Visit (INDEPENDENT_AMBULATORY_CARE_PROVIDER_SITE_OTHER): Payer: PPO | Admitting: Family Medicine

## 2016-02-18 VITALS — BP 150/84 | HR 69 | Temp 98.5°F | Ht 59.0 in | Wt 160.0 lb

## 2016-02-18 DIAGNOSIS — M150 Primary generalized (osteo)arthritis: Secondary | ICD-10-CM

## 2016-02-18 DIAGNOSIS — M159 Polyosteoarthritis, unspecified: Secondary | ICD-10-CM

## 2016-02-18 DIAGNOSIS — E1142 Type 2 diabetes mellitus with diabetic polyneuropathy: Secondary | ICD-10-CM | POA: Diagnosis not present

## 2016-02-18 DIAGNOSIS — I10 Essential (primary) hypertension: Secondary | ICD-10-CM | POA: Diagnosis not present

## 2016-02-18 DIAGNOSIS — E782 Mixed hyperlipidemia: Secondary | ICD-10-CM

## 2016-02-18 DIAGNOSIS — K219 Gastro-esophageal reflux disease without esophagitis: Secondary | ICD-10-CM | POA: Diagnosis not present

## 2016-02-18 DIAGNOSIS — Z23 Encounter for immunization: Secondary | ICD-10-CM | POA: Diagnosis not present

## 2016-02-18 LAB — MICROSCOPIC EXAMINATION

## 2016-02-18 LAB — URINALYSIS, COMPLETE
Bilirubin, UA: NEGATIVE
Glucose, UA: NEGATIVE
Ketones, UA: NEGATIVE
NITRITE UA: POSITIVE — AB
PH UA: 8.5 — AB (ref 5.0–7.5)
Specific Gravity, UA: 1.02 (ref 1.005–1.030)
UUROB: 0.2 mg/dL (ref 0.2–1.0)

## 2016-02-18 LAB — BAYER DCA HB A1C WAIVED: HB A1C (BAYER DCA - WAIVED): 6.6 % (ref <7.0)

## 2016-02-18 MED ORDER — NITROFURANTOIN MONOHYD MACRO 100 MG PO CAPS: 100.0000 mg | ORAL_CAPSULE | Freq: Every day | ORAL | 5 refills | Status: DC

## 2016-02-18 MED ORDER — NITROFURANTOIN MONOHYD MACRO 100 MG PO CAPS: 100.0000 mg | ORAL_CAPSULE | Freq: Two times a day (BID) | ORAL | 0 refills | Status: DC

## 2016-02-18 MED ORDER — INSULIN GLARGINE 100 UNIT/ML SOLOSTAR PEN: PEN_INJECTOR | SUBCUTANEOUS | 11 refills | Status: DC

## 2016-02-18 NOTE — Progress Notes (Signed)
Subjective:  Patient ID: [name], female    DOB: May 15, 1925  Age: 82 y.o. MRN: [id number]  CC: med follow up   HPI [name] presents for  follow-up of hypertension. Patient has no history of headache chest pain or shortness of breath or recent cough. Patient also denies symptoms of TIA such as numbness weakness lateralizing. Patient checks  blood pressure at home and has had several elevated readings of 623-762 systolic recently. Patient denies side effects from his medication. States taking it regularly.  Patient also  in for follow-up of elevated cholesterol. Doing well without complaints on current medication. Denies side effects of statin including myalgia and arthralgia and nausea. Also in today for liver function testing. Currently no chest pain, shortness of breath or other cardiovascular related symptoms noted.  Follow-up of diabetes. Patient does check blood sugar at home. Readings run between 105 and 135. Log attached. Patient denies symptoms such as polyuria, polydipsia, excessive hunger, nausea No significant hypoglycemic spells noted. Medications as noted below. Taking them regularly without complication/adverse reaction being reported today.   Arthritis - deep hurt all over. Left shoulder & arm. Some back pain. Mostrly  Shoulders and hips. Partial relief with Mobic  Still having some dysuria. Started back this AM. Didn't try vesicare.   History Joan has a past medical history of Diabetes mellitus without complication ([place]); Frequent UTI; Hyperlipidemia; Hypertension; and Neck pain.   She has a past surgical history that includes Appendectomy; Cholecystectomy; Tonsillectomy; and Joint replacement.   Her family history includes Cancer in her brother, father, and mother.She reports that she has never smoked. She has never used smokeless tobacco. She reports that she does not drink alcohol or use drugs.  Current Outpatient Prescriptions on File Prior to Visit    Medication Sig Dispense Refill  . amLODipine (NORVASC) 2.5 MG tablet Take 1 tablet (2.5 mg total) by mouth daily. 30 tablet 5  . Aspirin-Caffeine (BAYER BACK & BODY PAIN EX ST PO) Take 1 tablet by mouth daily as needed (pain).     . Calcium Carbonate-Vit D-Min (CALCIUM 1200 PO) Take 1 tablet by mouth every other day.     . Cholecalciferol (VITAMIN D3) 5000 units CAPS Take 1 capsule by mouth daily.    Marland Kitchen diltiazem (CARDIZEM) 120 MG tablet TAKE ONE TABLET BY MOUTH ONCE DAILY 60 tablet 2  . gabapentin (NEURONTIN) 100 MG capsule TAKE 3 CAPSULES AT BEDTIME 270 capsule 0  . MAGNESIUM-ZINC PO Take 15 mg by mouth 3 (three) times daily.    . meloxicam (MOBIC) 15 MG tablet Take 15 mg by mouth daily.     . metFORMIN (GLUCOPHAGE) 500 MG tablet TAKE ONE TABLET BY MOUTH ONCE DAILY WITH BREAKFAST 90 tablet 0  . omeprazole (PRILOSEC) 20 MG capsule TAKE ONE CAPSULE BY MOUTH TWICE DAILY BEFORE MEAL(S) 180 capsule 1   No current facility-administered medications on file prior to visit.     ROS Review of Systems  Constitutional: Negative for activity change, appetite change and fever.  HENT: Negative for congestion, rhinorrhea and sore throat.   Eyes: Negative for visual disturbance.  Respiratory: Negative for cough and shortness of breath.   Cardiovascular: Negative for chest pain and palpitations.  Gastrointestinal: Negative for abdominal pain, diarrhea and nausea.  Genitourinary: Negative for dysuria.  Musculoskeletal: Negative for arthralgias and myalgias.    Objective:  BP (!) 150/84 (BP Location: Right Arm)   Pulse 69   Temp 98.5 F (36.9 C) (Oral)  Ht 4\' 11"  (1.499 m)   Wt 160 lb (72.6 kg)   BMI 32.32 kg/m   BP Readings from Last 3 Encounters:  02/18/16 (!) 150/84  01/11/16 (!) 148/78  12/17/15 127/77    Wt Readings from Last 3 Encounters:  02/18/16 160 lb (72.6 kg)  01/11/16 162 lb (73.5 kg)  12/17/15 162 lb (73.5 kg)     Physical Exam  Constitutional: She is oriented to  person, place, and time. She appears well-developed and well-nourished. No distress.  HENT:  Head: Normocephalic and atraumatic.  Right Ear: External ear normal.  Left Ear: External ear normal.  Nose: Nose normal.  Mouth/Throat: Oropharynx is clear and moist.  Eyes: Conjunctivae and EOM are normal. Pupils are equal, round, and reactive to light.  Neck: Normal range of motion. Neck supple. No thyromegaly present.  Cardiovascular: Normal rate, regular rhythm and normal heart sounds.   No murmur heard. Pulmonary/Chest: Effort normal and breath sounds normal. No respiratory distress. She has no wheezes. She has no rales.  Abdominal: Soft. Bowel sounds are normal. She exhibits no distension. There is no tenderness.  Lymphadenopathy:    She has no cervical adenopathy.  Neurological: She is alert and oriented to person, place, and time. She has normal reflexes.  Skin: Skin is warm and dry.  Psychiatric: She has a normal mood and affect. Her behavior is normal. Judgment and thought content normal.    Lab Results  Component Value Date   HGBA1C 6.8 07/03/2015   HGBA1C 7.0 11/14/2014   HGBA1C 6.2 01/06/2014    Lab Results  Component Value Date   WBC 8.9 10/18/2015   HGB 13.2 11/14/2014   HCT 40.6 10/18/2015   PLT 264 10/18/2015   GLUCOSE 107 (H) 10/18/2015   CHOL 247 (H) 07/03/2015   TRIG 217 (H) 07/03/2015   HDL 40 07/03/2015   LDLCALC 164 (H) 07/03/2015   ALT 24 10/18/2015   AST 21 10/18/2015   NA 140 10/18/2015   K 4.0 10/18/2015   CL 97 10/18/2015   CREATININE 1.06 (H) 10/18/2015   BUN 22 10/18/2015   CO2 24 10/18/2015   TSH 1.420 09/20/2015   INR 1.9 (H) 05/22/2007   HGBA1C 6.8 07/03/2015   MICROALBUR neg 07/15/2013    07/17/2013 Venous Img Lower Unilateral Left  Result Date: 07/13/2014 CLINICAL DATA:  Edema EXAM: LEFT LOWER EXTREMITY VENOUS DUPLEX ULTRASOUND TECHNIQUE: Doppler venous assessment of the left lower extremity deep venous system was performed, including  characterization of spectral flow, compressibility, and phasicity. COMPARISON:  None. FINDINGS: There is complete compressibility of the left common femoral, femoral, and popliteal veins. Doppler analysis demonstrates respiratory phasicity and augmentation of flow upon calf compression. No evidence of superficial vein or calf vein thrombosis. IMPRESSION: No evidence of left lower extremity DVT. Electronically Signed   By: 09/12/2014 M.D.   On: 07/13/2014 14:12    Assessment & Plan:   [name] was seen today for med follow up.  Diagnoses and all orders for this visit:  Diabetic polyneuropathy associated with type 2 diabetes mellitus ([facility]) -     Urine culture -     Urinalysis, Complete -     Bayer DCA Hb A1c Waived -     CBC with Differential/Platelet -     CMP14+EGFR -     Lipid panel  Essential hypertension -     Urine culture -     Urinalysis, Complete -     CMP14+EGFR  Gastroesophageal reflux disease without  esophagitis -     CMP14+EGFR  Mixed hyperlipidemia -     CMP14+EGFR -     Lipid panel  Primary osteoarthritis involving multiple joints -     CMP14+EGFR  Other orders -     Insulin Glargine (LANTUS SOLOSTAR) 100 UNIT/ML Solostar Pen; INJECT 23 UNITS SUBCUTANEOUSLY ONCE DAILY AT 9 PM as directed. -     nitrofurantoin, macrocrystal-monohydrate, (MACROBID) 100 MG capsule; Take 1 capsule (100 mg total) by mouth 2 (two) times daily. -     nitrofurantoin, macrocrystal-monohydrate, (MACROBID) 100 MG capsule; Take 1 capsule (100 mg total) by mouth daily.   I have discontinued [name]'s potassium chloride SA, furosemide, solifenacin, ONE TOUCH ULTRA TEST, LANTUS SOLOSTAR, and LANTUS SOLOSTAR. I have also changed her Insulin Glargine. Additionally, I am having her start on nitrofurantoin (macrocrystal-monohydrate) and nitrofurantoin (macrocrystal-monohydrate). Lastly, I am having her maintain her Aspirin-Caffeine (BAYER BACK & BODY PAIN EX ST PO), Calcium Carbonate-Vit D-Min  (CALCIUM 1200 PO), MAGNESIUM-ZINC PO, Vitamin D3, meloxicam, diltiazem, amLODipine, omeprazole, gabapentin, and metFORMIN.  Meds ordered this encounter  Medications  . Insulin Glargine (LANTUS SOLOSTAR) 100 UNIT/ML Solostar Pen    Sig: INJECT 23 UNITS SUBCUTANEOUSLY ONCE DAILY AT 9 PM as directed.    Dispense:  15 mL    Refill:  11  . nitrofurantoin, macrocrystal-monohydrate, (MACROBID) 100 MG capsule    Sig: Take 1 capsule (100 mg total) by mouth 2 (two) times daily.    Dispense:  14 capsule    Refill:  0  . nitrofurantoin, macrocrystal-monohydrate, (MACROBID) 100 MG capsule    Sig: Take 1 capsule (100 mg total) by mouth daily.    Dispense:  30 capsule    Refill:  5     Follow-up: Return in about 6 weeks (around 03/31/2016).  Claretta Fraise, M.D.

## 2016-02-19 LAB — CMP14+EGFR
A/G RATIO: 1.7 (ref 1.2–2.2)
ALBUMIN: 4.3 g/dL (ref 3.5–4.7)
ALK PHOS: 74 IU/L (ref 39–117)
ALT: 26 IU/L (ref 0–32)
AST: 23 IU/L (ref 0–40)
BILIRUBIN TOTAL: 0.3 mg/dL (ref 0.0–1.2)
BUN / CREAT RATIO: 24 (ref 12–28)
BUN: 17 mg/dL (ref 8–27)
CHLORIDE: 99 mmol/L (ref 96–106)
CO2: 28 mmol/L (ref 18–29)
Calcium: 9.8 mg/dL (ref 8.7–10.3)
Creatinine, Ser: 0.72 mg/dL (ref 0.57–1.00)
GFR calc Af Amer: 86 mL/min/{1.73_m2} (ref >59)
GFR calc non Af Amer: 75 mL/min/{1.73_m2} (ref >59)
GLOBULIN, TOTAL: 2.5 g/dL (ref 1.5–4.5)
GLUCOSE: 78 mg/dL (ref 65–99)
Potassium: 4.5 mmol/L (ref 3.5–5.2)
SODIUM: 141 mmol/L (ref 134–144)
Total Protein: 6.8 g/dL (ref 6.0–8.5)

## 2016-02-19 LAB — CBC WITH DIFFERENTIAL/PLATELET
BASOS ABS: 0.1 10*3/uL (ref 0.0–0.2)
Basos: 1 %
EOS (ABSOLUTE): 0.5 10*3/uL — ABNORMAL HIGH (ref 0.0–0.4)
Eos: 5 %
HEMATOCRIT: 40.5 % (ref 34.0–46.6)
HEMOGLOBIN: 12.8 g/dL (ref 11.1–15.9)
Immature Grans (Abs): 0 10*3/uL (ref 0.0–0.1)
Immature Granulocytes: 0 %
LYMPHS ABS: 1.5 10*3/uL (ref 0.7–3.1)
Lymphs: 15 %
MCH: 25.3 pg — AB (ref 26.6–33.0)
MCHC: 31.6 g/dL (ref 31.5–35.7)
MCV: 80 fL (ref 79–97)
MONOCYTES: 7 %
Monocytes Absolute: 0.7 10*3/uL (ref 0.1–0.9)
NEUTROS ABS: 7.4 10*3/uL — AB (ref 1.4–7.0)
Neutrophils: 72 %
Platelets: 282 10*3/uL (ref 150–379)
RBC: 5.06 x10E6/uL (ref 3.77–5.28)
RDW: 16.1 % — ABNORMAL HIGH (ref 12.3–15.4)
WBC: 10.2 10*3/uL (ref 3.4–10.8)

## 2016-02-19 LAB — LIPID PANEL
CHOLESTEROL TOTAL: 233 mg/dL — AB (ref 100–199)
Chol/HDL Ratio: 5.3 ratio units — ABNORMAL HIGH (ref 0.0–4.4)
HDL: 44 mg/dL (ref >39)
LDL Calculated: 159 mg/dL — ABNORMAL HIGH (ref 0–99)
Triglycerides: 152 mg/dL — ABNORMAL HIGH (ref 0–149)
VLDL Cholesterol Cal: 30 mg/dL (ref 5–40)

## 2016-02-20 ENCOUNTER — Other Ambulatory Visit: Payer: Self-pay | Admitting: Family Medicine

## 2016-02-20 LAB — URINE CULTURE

## 2016-02-20 MED ORDER — CIPROFLOXACIN HCL 250 MG PO TABS: 250.0000 mg | ORAL_TABLET | Freq: Two times a day (BID) | ORAL | 0 refills | Status: DC

## 2016-02-21 ENCOUNTER — Telehealth: Payer: Self-pay | Admitting: Family Medicine

## 2016-02-21 NOTE — Telephone Encounter (Signed)
Pt wasn't sure why we had sent in [place], advised that when we received the culture she needed Cipro, pt voiced understanding.

## 2016-03-12 ENCOUNTER — Telehealth: Payer: Self-pay | Admitting: *Deleted

## 2016-03-12 ENCOUNTER — Telehealth: Payer: Self-pay | Admitting: Family Medicine

## 2016-03-12 ENCOUNTER — Other Ambulatory Visit: Payer: Self-pay | Admitting: Family Medicine

## 2016-03-12 MED ORDER — INSULIN GLARGINE 100 UNIT/ML SOLOSTAR PEN: PEN_INJECTOR | SUBCUTANEOUS | 11 refills | Status: DC

## 2016-03-12 MED ORDER — NITROFURANTOIN MONOHYD MACRO 100 MG PO CAPS: 100.0000 mg | ORAL_CAPSULE | Freq: Every day | ORAL | 5 refills | Status: DC

## 2016-03-12 NOTE — Telephone Encounter (Signed)
[facility] called on directions for Lantus. Sig is for 30-45 units daily from September which ins is charging patient for 2 copays. Dr. Livia Snellen ok's to change sig to 23-50 units daily.

## 2016-03-12 NOTE — Telephone Encounter (Signed)
Pt aware to start on Friday - the one a day

## 2016-03-12 NOTE — Telephone Encounter (Signed)
FYI  - pt finished Cipro, then took [place] and will finish it tomorrow morning.  She is coming in tomorrow for a UA and then wait for your further instruction.   She thinks that you will say for her to start the one a day Macrobid at soon.

## 2016-03-12 NOTE — Telephone Encounter (Signed)
I went ahead and send in nitrofurantoin for her to take 1 at bedtime. This will continue indefinitely.

## 2016-03-12 NOTE — Telephone Encounter (Signed)
Please see the last note from [place] - in this encounter

## 2016-03-13 ENCOUNTER — Other Ambulatory Visit: Payer: PPO

## 2016-03-13 DIAGNOSIS — N390 Urinary tract infection, site not specified: Secondary | ICD-10-CM | POA: Diagnosis not present

## 2016-03-13 LAB — URINALYSIS, COMPLETE
Bilirubin, UA: NEGATIVE
Glucose, UA: NEGATIVE
LEUKOCYTES UA: NEGATIVE
Nitrite, UA: NEGATIVE
PH UA: 5.5 (ref 5.0–7.5)
RBC, UA: NEGATIVE
Specific Gravity, UA: 1.02 (ref 1.005–1.030)
UUROB: 0.2 mg/dL (ref 0.2–1.0)

## 2016-03-13 LAB — MICROSCOPIC EXAMINATION: RBC, UA: NONE SEEN /hpf (ref 0–?)

## 2016-03-14 LAB — URINE CULTURE

## 2016-03-20 DIAGNOSIS — G8929 Other chronic pain: Secondary | ICD-10-CM | POA: Diagnosis not present

## 2016-03-20 DIAGNOSIS — M25511 Pain in right shoulder: Secondary | ICD-10-CM | POA: Diagnosis not present

## 2016-03-31 ENCOUNTER — Ambulatory Visit (INDEPENDENT_AMBULATORY_CARE_PROVIDER_SITE_OTHER): Payer: PPO | Admitting: Family Medicine

## 2016-03-31 ENCOUNTER — Encounter: Payer: Self-pay | Admitting: Family Medicine

## 2016-03-31 VITALS — BP 136/75 | HR 65 | Ht 59.0 in | Wt 158.0 lb

## 2016-03-31 DIAGNOSIS — E782 Mixed hyperlipidemia: Secondary | ICD-10-CM | POA: Diagnosis not present

## 2016-03-31 DIAGNOSIS — K219 Gastro-esophageal reflux disease without esophagitis: Secondary | ICD-10-CM

## 2016-03-31 DIAGNOSIS — M150 Primary generalized (osteo)arthritis: Secondary | ICD-10-CM | POA: Diagnosis not present

## 2016-03-31 DIAGNOSIS — E1142 Type 2 diabetes mellitus with diabetic polyneuropathy: Secondary | ICD-10-CM | POA: Diagnosis not present

## 2016-03-31 DIAGNOSIS — R252 Cramp and spasm: Secondary | ICD-10-CM | POA: Diagnosis not present

## 2016-03-31 DIAGNOSIS — M159 Polyosteoarthritis, unspecified: Secondary | ICD-10-CM

## 2016-03-31 DIAGNOSIS — R413 Other amnesia: Secondary | ICD-10-CM

## 2016-03-31 LAB — BAYER DCA HB A1C WAIVED: HB A1C (BAYER DCA - WAIVED): 6.7 % (ref <7.0)

## 2016-03-31 LAB — URINALYSIS
Bilirubin, UA: NEGATIVE
Glucose, UA: NEGATIVE
KETONES UA: NEGATIVE
LEUKOCYTES UA: NEGATIVE
NITRITE UA: NEGATIVE
PROTEIN UA: NEGATIVE
RBC, UA: NEGATIVE
SPEC GRAV UA: 1.01 (ref 1.005–1.030)
Urobilinogen, Ur: 0.2 mg/dL (ref 0.2–1.0)
pH, UA: 7.5 (ref 5.0–7.5)

## 2016-03-31 MED ORDER — DILTIAZEM HCL ER COATED BEADS 180 MG PO CP24: 180.0000 mg | ORAL_CAPSULE | Freq: Every day | ORAL | 2 refills | Status: DC

## 2016-03-31 MED ORDER — INSULIN GLARGINE 100 UNIT/ML SOLOSTAR PEN: PEN_INJECTOR | SUBCUTANEOUS | 11 refills | Status: DC

## 2016-03-31 MED ORDER — GABAPENTIN 100 MG PO CAPS: 200.0000 mg | ORAL_CAPSULE | Freq: Two times a day (BID) | ORAL | 0 refills | Status: DC

## 2016-03-31 MED ORDER — MAGNESIUM 250 MG PO TABS: 500.0000 mg | ORAL_TABLET | Freq: Two times a day (BID) | ORAL | 11 refills | Status: DC

## 2016-03-31 NOTE — Progress Notes (Signed)
Subjective:  Patient ID: [name], female    DOB: 11-Sep-1925  Age: 82 y.o. MRN: [id number]  CC: Diabetes (pt here for routine follow up of her diabetes, and is also c/o leg cramps)   HPI [name] presents for  follow-up of hypertension. Patient has no history of headache chest pain or shortness of breath or recent cough. Patient also denies symptoms of TIA such as numbness weakness lateralizing. Patient checks  blood pressure at home and has several elevated readings recently. Patient denies side effects from  medication. States taking it regularly.  Patient also  in for follow-up of elevated cholesterol. Doing well without complaints on current medication. Denies side effects of statin including myalgia and arthralgia and nausea. Also in today for liver function testing. Currently no chest pain, shortness of breath or other cardiovascular related symptoms noted.  Follow-up of diabetes. Patient does check blood sugar at home. Readings run between 100 -150 Patient denies symptoms such as polyuria, polydipsia, excessive hunger, nausea No significant hypoglycemic spells noted. Medications as noted below. Taking them regularly without complication/adverse reaction being reported today.    History [name] has a past medical history of Diabetes mellitus without complication ([place]); Frequent UTI; Hyperlipidemia; Hypertension; and Neck pain.   She has a past surgical history that includes Appendectomy; Cholecystectomy; Tonsillectomy; and Joint replacement.   Her family history includes Cancer in her brother, father, and mother.She reports that she has never smoked. She has never used smokeless tobacco. She reports that she does not drink alcohol or use drugs.  Current Outpatient Prescriptions on File Prior to Visit  Medication Sig Dispense Refill  . amLODipine (NORVASC) 2.5 MG tablet Take 1 tablet (2.5 mg total) by mouth daily. 30 tablet 5  . Aspirin-Caffeine (BAYER BACK & BODY PAIN  EX ST PO) Take 1 tablet by mouth daily as needed (pain).     . Calcium Carbonate-Vit D-Min (CALCIUM 1200 PO) Take 1 tablet by mouth every other day.     . Cholecalciferol (VITAMIN D3) 5000 units CAPS Take 1 capsule by mouth daily.    . meloxicam (MOBIC) 15 MG tablet Take 15 mg by mouth daily.     . metFORMIN (GLUCOPHAGE) 500 MG tablet TAKE ONE TABLET BY MOUTH ONCE DAILY WITH BREAKFAST 90 tablet 0  . omeprazole (PRILOSEC) 20 MG capsule TAKE ONE CAPSULE BY MOUTH TWICE DAILY BEFORE MEAL(S) 180 capsule 1   No current facility-administered medications on file prior to visit.     ROS Review of Systems  Constitutional: Negative for activity change, appetite change and fever.  HENT: Negative for congestion, rhinorrhea and sore throat.   Eyes: Negative for visual disturbance.  Respiratory: Negative for cough and shortness of breath.   Cardiovascular: Negative for chest pain and palpitations.  Gastrointestinal: Negative for abdominal pain, diarrhea and nausea.  Genitourinary: Negative for dysuria.  Musculoskeletal: Negative for arthralgias and myalgias.    Objective:  BP 136/75   Pulse 65   Ht '4\' 11"'$  (1.499 m)   Wt 158 lb (71.7 kg)   BMI 31.91 kg/m   BP Readings from Last 3 Encounters:  03/31/16 136/75  02/18/16 (!) 150/84  01/11/16 (!) 148/78    Wt Readings from Last 3 Encounters:  03/31/16 158 lb (71.7 kg)  02/18/16 160 lb (72.6 kg)  01/11/16 162 lb (73.5 kg)     Physical Exam  Constitutional: She is oriented to person, place, and time. She appears well-developed and well-nourished. No distress.  HENT:  Head:  Normocephalic and atraumatic.  Right Ear: External ear normal.  Left Ear: External ear normal.  Nose: Nose normal.  Mouth/Throat: Oropharynx is clear and moist.  Eyes: Conjunctivae and EOM are normal. Pupils are equal, round, and reactive to light.  Neck: Normal range of motion. Neck supple. No thyromegaly present.  Cardiovascular: Normal rate, regular rhythm and  normal heart sounds.   No murmur heard. Pulmonary/Chest: Effort normal and breath sounds normal. No respiratory distress. She has no wheezes. She has no rales.  Abdominal: Soft. Bowel sounds are normal. She exhibits no distension. There is no tenderness.  Lymphadenopathy:    She has no cervical adenopathy.  Neurological: She is alert and oriented to person, place, and time. She has normal reflexes.  Skin: Skin is warm and dry.  Psychiatric: She has a normal mood and affect. Her behavior is normal. Judgment and thought content normal.    Lab Results  Component Value Date   HGBA1C 6.8 07/03/2015   HGBA1C 7.0 11/14/2014   HGBA1C 6.2 01/06/2014    Lab Results  Component Value Date   WBC 10.2 02/18/2016   HGB 13.2 11/14/2014   HCT 40.5 02/18/2016   PLT 282 02/18/2016   GLUCOSE 78 02/18/2016   CHOL 233 (H) 02/18/2016   TRIG 152 (H) 02/18/2016   HDL 44 02/18/2016   LDLCALC 159 (H) 02/18/2016   ALT 26 02/18/2016   AST 23 02/18/2016   NA 141 02/18/2016   K 4.5 02/18/2016   CL 99 02/18/2016   CREATININE 0.72 02/18/2016   BUN 17 02/18/2016   CO2 28 02/18/2016   TSH 1.420 09/20/2015   INR 1.9 (H) 05/22/2007   HGBA1C 6.8 07/03/2015   MICROALBUR neg 07/15/2013    US Venous Img Lower Unilateral Left  Result Date: 07/13/2014 CLINICAL DATA:  Edema EXAM: LEFT LOWER EXTREMITY VENOUS DUPLEX ULTRASOUND TECHNIQUE: Doppler venous assessment of the left lower extremity deep venous system was performed, including characterization of spectral flow, compressibility, and phasicity. COMPARISON:  None. FINDINGS: There is complete compressibility of the left common femoral, femoral, and popliteal veins. Doppler analysis demonstrates respiratory phasicity and augmentation of flow upon calf compression. No evidence of superficial vein or calf vein thrombosis. IMPRESSION: No evidence of left lower extremity DVT. Electronically Signed   By: [name] M.D.   On: 07/13/2014 14:12    Assessment & Plan:     [name] was seen today for diabetes.  Diagnoses and all orders for this visit:  Diabetic polyneuropathy associated with type 2 diabetes mellitus ([place]) -     Microalbumin / creatinine urine ratio -     Urinalysis -     Bayer DCA Hb A1c Waived -     Lipid panel -     CMP14+EGFR  Gastroesophageal reflux disease without esophagitis  Mixed hyperlipidemia -     Lipid panel  Primary osteoarthritis involving multiple joints -     CMP14+EGFR  Leg cramps -     CMP14+EGFR  Memory loss -     CMP14+EGFR  Other orders -     diltiazem (CARTIA XT) 180 MG 24 hr capsule; Take 1 capsule (180 mg total) by mouth daily. -     gabapentin (NEURONTIN) 100 MG capsule; Take 2 capsules (200 mg total) by mouth 2 (two) times daily. -     Insulin Glargine (LANTUS SOLOSTAR) 100 UNIT/ML Solostar Pen; INJECT 21 UNITS SUBCUTANEOUSLY ONCE DAILY AT 9 PM as directed. -     Magnesium 250 MG TABS; Take  2 tablets (500 mg total) by mouth 2 (two) times daily.   I have discontinued Ms. Goeden's MAGNESIUM-ZINC PO, diltiazem, ciprofloxacin, and nitrofurantoin (macrocrystal-monohydrate). I have also changed her gabapentin and Insulin Glargine. Additionally, I am having her start on diltiazem and Magnesium. Lastly, I am having her maintain her Aspirin-Caffeine (BAYER BACK & BODY PAIN EX ST PO), Calcium Carbonate-Vit D-Min (CALCIUM 1200 PO), Vitamin D3, meloxicam, amLODipine, omeprazole, and metFORMIN.  Meds ordered this encounter  Medications  . diltiazem (CARTIA XT) 180 MG 24 hr capsule    Sig: Take 1 capsule (180 mg total) by mouth daily.    Dispense:  30 capsule    Refill:  2  . gabapentin (NEURONTIN) 100 MG capsule    Sig: Take 2 capsules (200 mg total) by mouth 2 (two) times daily.    Dispense:  270 capsule    Refill:  0  . Insulin Glargine (LANTUS SOLOSTAR) 100 UNIT/ML Solostar Pen    Sig: INJECT 21 UNITS SUBCUTANEOUSLY ONCE DAILY AT 9 PM as directed.    Dispense:  15 mL    Refill:  11  . Magnesium 250 MG  TABS    Sig: Take 2 tablets (500 mg total) by mouth 2 (two) times daily.    Dispense:  120 tablet    Refill:  11     Follow-up: Return in about 3 months (around 07/01/2016).  Claretta Fraise, M.D.

## 2016-04-01 ENCOUNTER — Telehealth: Payer: Self-pay | Admitting: *Deleted

## 2016-04-01 LAB — CMP14+EGFR
A/G RATIO: 1.5 (ref 1.2–2.2)
ALK PHOS: 88 IU/L (ref 39–117)
ALT: 33 IU/L — ABNORMAL HIGH (ref 0–32)
AST: 21 IU/L (ref 0–40)
Albumin: 4.2 g/dL (ref 3.5–4.7)
BUN / CREAT RATIO: 21 (ref 12–28)
BUN: 19 mg/dL (ref 8–27)
Bilirubin Total: 0.2 mg/dL (ref 0.0–1.2)
CO2: 26 mmol/L (ref 18–29)
Calcium: 9.5 mg/dL (ref 8.7–10.3)
Chloride: 97 mmol/L (ref 96–106)
Creatinine, Ser: 0.9 mg/dL (ref 0.57–1.00)
GFR calc Af Amer: 66 mL/min/{1.73_m2} (ref >59)
GFR calc non Af Amer: 57 mL/min/{1.73_m2} — ABNORMAL LOW (ref >59)
GLOBULIN, TOTAL: 2.8 g/dL (ref 1.5–4.5)
Glucose: 95 mg/dL (ref 65–99)
POTASSIUM: 4.4 mmol/L (ref 3.5–5.2)
SODIUM: 140 mmol/L (ref 134–144)
Total Protein: 7 g/dL (ref 6.0–8.5)

## 2016-04-01 LAB — MICROALBUMIN / CREATININE URINE RATIO
Creatinine, Urine: 23.3 mg/dL
Microalb/Creat Ratio: 39.9 mg/g creat — ABNORMAL HIGH (ref 0.0–30.0)
Microalbumin, Urine: 9.3 ug/mL

## 2016-04-01 LAB — LIPID PANEL
CHOLESTEROL TOTAL: 253 mg/dL — AB (ref 100–199)
Chol/HDL Ratio: 4.7 ratio units — ABNORMAL HIGH (ref 0.0–4.4)
HDL: 54 mg/dL (ref >39)
LDL CALC: 164 mg/dL — AB (ref 0–99)
TRIGLYCERIDES: 176 mg/dL — AB (ref 0–149)
VLDL CHOLESTEROL CAL: 35 mg/dL (ref 5–40)

## 2016-04-01 NOTE — Telephone Encounter (Signed)
Pt would like to know if a thyroid panel can be added to her labs from yesterday, due to dry skin, hair loss and fatigue Please advise

## 2016-04-01 NOTE — Telephone Encounter (Signed)
TSH and free T4 added.  

## 2016-04-01 NOTE — Telephone Encounter (Signed)
Add TSH, free T4

## 2016-04-02 LAB — SPECIMEN STATUS REPORT

## 2016-04-02 LAB — TSH+FREE T4
FREE T4: 1.19 ng/dL (ref 0.82–1.77)
TSH: 1.39 u[IU]/mL (ref 0.450–4.500)

## 2016-04-03 DIAGNOSIS — B351 Tinea unguium: Secondary | ICD-10-CM | POA: Diagnosis not present

## 2016-04-03 DIAGNOSIS — L84 Corns and callosities: Secondary | ICD-10-CM | POA: Diagnosis not present

## 2016-04-03 DIAGNOSIS — E1142 Type 2 diabetes mellitus with diabetic polyneuropathy: Secondary | ICD-10-CM | POA: Diagnosis not present

## 2016-04-17 ENCOUNTER — Other Ambulatory Visit: Payer: Self-pay | Admitting: *Deleted

## 2016-04-17 DIAGNOSIS — E1142 Type 2 diabetes mellitus with diabetic polyneuropathy: Secondary | ICD-10-CM

## 2016-04-17 MED ORDER — INSULIN PEN NEEDLE 31G X 8 MM MISC: 6 refills | Status: DC

## 2016-04-19 ENCOUNTER — Other Ambulatory Visit: Payer: Self-pay | Admitting: Family Medicine

## 2016-04-19 DIAGNOSIS — I10 Essential (primary) hypertension: Secondary | ICD-10-CM

## 2016-04-22 ENCOUNTER — Telehealth: Payer: Self-pay | Admitting: Family Medicine

## 2016-04-22 NOTE — Telephone Encounter (Signed)
Okay at current (recent) level for 6 mos. Samples okay as well.Thanks ws

## 2016-04-22 NOTE — Telephone Encounter (Signed)
Pt has been on Myrbetriq 25mg  It has been d/ced on her med list but she has continued to take Did you want to d/c med? If not, pt would like some samples

## 2016-04-22 NOTE — Telephone Encounter (Signed)
Pt notified samples are ready for pick-up.  

## 2016-05-19 DIAGNOSIS — H40033 Anatomical narrow angle, bilateral: Secondary | ICD-10-CM | POA: Diagnosis not present

## 2016-05-19 DIAGNOSIS — H2513 Age-related nuclear cataract, bilateral: Secondary | ICD-10-CM | POA: Diagnosis not present

## 2016-05-28 ENCOUNTER — Other Ambulatory Visit: Payer: Self-pay | Admitting: Family Medicine

## 2016-05-29 ENCOUNTER — Ambulatory Visit (INDEPENDENT_AMBULATORY_CARE_PROVIDER_SITE_OTHER): Payer: PPO

## 2016-05-29 ENCOUNTER — Other Ambulatory Visit (INDEPENDENT_AMBULATORY_CARE_PROVIDER_SITE_OTHER): Payer: PPO

## 2016-05-29 ENCOUNTER — Other Ambulatory Visit: Payer: Self-pay | Admitting: Orthopedic Surgery

## 2016-05-29 DIAGNOSIS — M542 Cervicalgia: Secondary | ICD-10-CM

## 2016-05-29 DIAGNOSIS — R52 Pain, unspecified: Secondary | ICD-10-CM

## 2016-05-29 DIAGNOSIS — M25511 Pain in right shoulder: Secondary | ICD-10-CM

## 2016-05-29 DIAGNOSIS — G8929 Other chronic pain: Secondary | ICD-10-CM | POA: Diagnosis not present

## 2016-05-29 DIAGNOSIS — R29898 Other symptoms and signs involving the musculoskeletal system: Secondary | ICD-10-CM | POA: Diagnosis not present

## 2016-06-03 ENCOUNTER — Other Ambulatory Visit: Payer: Self-pay | Admitting: *Deleted

## 2016-06-03 MED ORDER — GABAPENTIN 100 MG PO CAPS: 200.0000 mg | ORAL_CAPSULE | Freq: Two times a day (BID) | ORAL | 1 refills | Status: DC

## 2016-06-03 NOTE — Progress Notes (Signed)
Pt came in to ask  Gabapentin rx be transferred to [facility] transferred per pt request Okayed per Dr Livia Snellen

## 2016-06-09 ENCOUNTER — Telehealth: Payer: Self-pay | Admitting: Family Medicine

## 2016-06-09 MED ORDER — OMEPRAZOLE 20 MG PO CPDR: DELAYED_RELEASE_CAPSULE | ORAL | 1 refills | Status: DC

## 2016-06-09 NOTE — Telephone Encounter (Signed)
done

## 2016-06-11 ENCOUNTER — Other Ambulatory Visit: Payer: Self-pay

## 2016-06-11 MED ORDER — GLUCOSE BLOOD VI STRP: ORAL_STRIP | 0 refills | Status: DC

## 2016-06-11 MED ORDER — DILTIAZEM HCL ER COATED BEADS 180 MG PO CP24: 180.0000 mg | ORAL_CAPSULE | Freq: Every day | ORAL | 0 refills | Status: DC

## 2016-06-11 MED ORDER — METFORMIN HCL 500 MG PO TABS: ORAL_TABLET | ORAL | 0 refills | Status: DC

## 2016-06-19 DIAGNOSIS — L84 Corns and callosities: Secondary | ICD-10-CM | POA: Diagnosis not present

## 2016-06-19 DIAGNOSIS — B351 Tinea unguium: Secondary | ICD-10-CM | POA: Diagnosis not present

## 2016-06-19 DIAGNOSIS — E1142 Type 2 diabetes mellitus with diabetic polyneuropathy: Secondary | ICD-10-CM | POA: Diagnosis not present

## 2016-07-02 ENCOUNTER — Ambulatory Visit (INDEPENDENT_AMBULATORY_CARE_PROVIDER_SITE_OTHER): Payer: PPO | Admitting: Family Medicine

## 2016-07-02 ENCOUNTER — Encounter: Payer: Self-pay | Admitting: Family Medicine

## 2016-07-02 VITALS — BP 135/77 | HR 72 | Temp 98.2°F | Ht 59.0 in | Wt 160.0 lb

## 2016-07-02 DIAGNOSIS — I10 Essential (primary) hypertension: Secondary | ICD-10-CM

## 2016-07-02 DIAGNOSIS — E1142 Type 2 diabetes mellitus with diabetic polyneuropathy: Secondary | ICD-10-CM

## 2016-07-02 DIAGNOSIS — E782 Mixed hyperlipidemia: Secondary | ICD-10-CM

## 2016-07-02 DIAGNOSIS — J8410 Pulmonary fibrosis, unspecified: Secondary | ICD-10-CM

## 2016-07-02 LAB — BAYER DCA HB A1C WAIVED: HB A1C: 6.5 % (ref <7.0)

## 2016-07-02 MED ORDER — AMOXICILLIN-POT CLAVULANATE 875-125 MG PO TABS: 1.0000 | ORAL_TABLET | Freq: Two times a day (BID) | ORAL | 0 refills | Status: DC

## 2016-07-02 NOTE — Progress Notes (Signed)
Subjective:  Patient ID: [name], female    DOB: 1925/09/12  Age: 83 y.o. MRN: [id number]  CC: Diabetes (pt here today for routine follow up on her diabetes and is also c/o cough x 3 days)   HPI [name] presents for  follow-up of hypertension. Patient has no history of headache chest pain or shortness of breath or recent cough. Patient also denies symptoms of TIA such as numbness weakness lateralizing. Patient checks  blood pressure at home. Recent readings have been adequate Some at the 539 systolic level. Log attached. Patient denies side effects from medication. States taking it regularly.  Patient also  in for follow-up of elevated cholesterol. Doing well without complaints on current medication. Denies side effects of statin including myalgia and arthralgia and nausea. Also in today for liver function testing. Currently no chest pain, shortness of breath or other cardiovascular related symptoms noted.  Follow-up of diabetes. Patient does check blood sugar at home. Readings run between 80 and 130. Log attached Patient denies symptoms such as polyuria, polydipsia, excessive hunger, nausea No significant hypoglycemic spells noted. Medications reviewed. Pt reports taking them regularly. Pt. denies complication/adverse reaction today.    History [name] has a past medical history of Diabetes mellitus without complication ([place]); Frequent UTI; Hyperlipidemia; Hypertension; and Neck pain.   She has a past surgical history that includes Appendectomy; Cholecystectomy; Tonsillectomy; and Joint replacement.   Her family history includes Cancer in her brother, father, and mother.She reports that she has never smoked. She has never used smokeless tobacco. She reports that she does not drink alcohol or use drugs.  Current Outpatient Prescriptions on File Prior to Visit  Medication Sig Dispense Refill  . amLODipine (NORVASC) 2.5 MG tablet TAKE ONE TABLET BY MOUTH ONCE DAILY 30 tablet 4   . Aspirin-Caffeine (BAYER BACK & BODY PAIN EX ST PO) Take 1 tablet by mouth daily as needed (pain).     . Calcium Carbonate-Vit D-Min (CALCIUM 1200 PO) Take 1 tablet by mouth every other day.     . Cholecalciferol (VITAMIN D3) 5000 units CAPS Take 1 capsule by mouth daily.    Marland Kitchen diltiazem (CARTIA XT) 180 MG 24 hr capsule Take 1 capsule (180 mg total) by mouth daily. 90 capsule 0  . gabapentin (NEURONTIN) 100 MG capsule Take 2 capsules (200 mg total) by mouth 2 (two) times daily. 270 capsule 1  . glucose blood test strip Use as instructed 100 each 0  . Insulin Glargine (LANTUS SOLOSTAR) 100 UNIT/ML Solostar Pen INJECT 21 UNITS SUBCUTANEOUSLY ONCE DAILY AT 9 PM as directed. 15 mL 11  . Insulin Pen Needle 31G X 8 MM MISC Use once daily with lantus solostar 100 each 6  . Magnesium 250 MG TABS Take 2 tablets (500 mg total) by mouth 2 (two) times daily. 120 tablet 11  . meloxicam (MOBIC) 15 MG tablet Take 15 mg by mouth daily.     . metFORMIN (GLUCOPHAGE) 500 MG tablet TAKE ONE TABLET BY MOUTH ONCE DAILY WITH BREAKFAST 90 tablet 0  . omeprazole (PRILOSEC) 20 MG capsule TAKE ONE CAPSULE BY MOUTH TWICE DAILY BEFORE MEAL(S) 180 capsule 1   No current facility-administered medications on file prior to visit.     ROS Review of Systems  Constitutional: Negative for activity change, appetite change, chills and fever.  HENT: Positive for congestion, postnasal drip, rhinorrhea and sinus pressure. Negative for ear discharge, ear pain, hearing loss, nosebleeds, sneezing, sore throat and trouble swallowing.  Eyes: Negative for visual disturbance.  Respiratory: Positive for cough (dry, X 3 days.) and shortness of breath (Awakening dyspneic, mild, last three nights). Negative for chest tightness.   Cardiovascular: Negative for chest pain and palpitations.  Gastrointestinal: Negative for abdominal pain, diarrhea and nausea.  Genitourinary: Negative for dysuria.  Musculoskeletal: Positive for arthralgias (right  shoulder - worse at night.). Negative for myalgias.  Skin: Negative for rash.    Objective:  BP 135/77   Pulse 72   Temp 98.2 F (36.8 C) (Oral)   Ht '4\' 11"'$  (1.499 m)   Wt 160 lb (72.6 kg)   BMI 32.32 kg/m   BP Readings from Last 3 Encounters:  07/02/16 135/77  03/31/16 136/75  02/18/16 (!) 150/84    Wt Readings from Last 3 Encounters:  07/02/16 160 lb (72.6 kg)  03/31/16 158 lb (71.7 kg)  02/18/16 160 lb (72.6 kg)     Physical Exam  Constitutional: She is oriented to person, place, and time. She appears well-developed and well-nourished. No distress.  HENT:  Head: Normocephalic and atraumatic.  Right Ear: Tympanic membrane and external ear normal. No decreased hearing is noted.  Left Ear: Tympanic membrane and external ear normal. No decreased hearing is noted.  Nose: Mucosal edema present. Right sinus exhibits no frontal sinus tenderness. Left sinus exhibits no frontal sinus tenderness.  Mouth/Throat: Oropharynx is clear and moist. No oropharyngeal exudate or posterior oropharyngeal erythema.  Eyes: Conjunctivae and EOM are normal. Pupils are equal, round, and reactive to light.  Neck: Normal range of motion. Neck supple. No Brudzinski's sign noted. No thyromegaly present.  Cardiovascular: Normal rate, regular rhythm and normal heart sounds.   No murmur heard. Pulmonary/Chest: Effort normal and breath sounds normal. No respiratory distress. She has no wheezes. She has no rales.  Abdominal: Soft. Bowel sounds are normal. She exhibits no distension. There is no tenderness.  Lymphadenopathy:       Head (right side): No preauricular adenopathy present.       Head (left side): No preauricular adenopathy present.    She has no cervical adenopathy.       Right cervical: No superficial cervical adenopathy present.      Left cervical: No superficial cervical adenopathy present.  Neurological: She is alert and oriented to person, place, and time. She has normal reflexes.    Skin: Skin is warm and dry.  Psychiatric: She has a normal mood and affect. Her behavior is normal. Judgment and thought content normal.    No components found for: BAYER DCA HB A1C WAIVED    Assessment & Plan:   [place] was seen today for diabetes.  Diagnoses and all orders for this visit:  Diabetic polyneuropathy associated with type 2 diabetes mellitus ([place]) -     Microalbumin / creatinine urine ratio -     Bayer DCA Hb A1c Waived -     Urinalysis -     CMP14+EGFR  Mixed hyperlipidemia  Essential hypertension  PULMONARY FIBROSIS, POSTINFLAMMATORY  Other orders -     amoxicillin-clavulanate (AUGMENTIN) 875-125 MG tablet; Take 1 tablet by mouth 2 (two) times daily. Take all of this medication   I am having Ms. Civil start on amoxicillin-clavulanate. I am also having her maintain her Aspirin-Caffeine (BAYER BACK & BODY PAIN EX ST PO), Calcium Carbonate-Vit D-Min (CALCIUM 1200 PO), Vitamin D3, meloxicam, Insulin Glargine, Magnesium, Insulin Pen Needle, amLODipine, gabapentin, omeprazole, metFORMIN, diltiazem, and glucose blood.  Meds ordered this encounter  Medications  . amoxicillin-clavulanate (AUGMENTIN)  875-125 MG tablet    Sig: Take 1 tablet by mouth 2 (two) times daily. Take all of this medication    Dispense:  20 tablet    Refill:  0     Follow-up: Return in about 3 months (around 09/29/2016), or if symptoms worsen or fail to improve, for diabetes, Arthritis, hypertension, cholesterol.  Claretta Fraise, M.D.

## 2016-07-03 LAB — CMP14+EGFR
ALK PHOS: 78 IU/L (ref 39–117)
ALT: 32 IU/L (ref 0–32)
AST: 26 IU/L (ref 0–40)
Albumin/Globulin Ratio: 1.6 (ref 1.2–2.2)
Albumin: 4.1 g/dL (ref 3.5–4.7)
BILIRUBIN TOTAL: 0.3 mg/dL (ref 0.0–1.2)
BUN/Creatinine Ratio: 20 (ref 12–28)
BUN: 17 mg/dL (ref 8–27)
CHLORIDE: 97 mmol/L (ref 96–106)
CO2: 24 mmol/L (ref 18–29)
Calcium: 9.3 mg/dL (ref 8.7–10.3)
Creatinine, Ser: 0.83 mg/dL (ref 0.57–1.00)
GFR calc non Af Amer: 63 mL/min/{1.73_m2} (ref >59)
GFR, EST AFRICAN AMERICAN: 73 mL/min/{1.73_m2} (ref >59)
GLUCOSE: 76 mg/dL (ref 65–99)
Globulin, Total: 2.6 g/dL (ref 1.5–4.5)
POTASSIUM: 4.4 mmol/L (ref 3.5–5.2)
Sodium: 139 mmol/L (ref 134–144)
TOTAL PROTEIN: 6.7 g/dL (ref 6.0–8.5)

## 2016-08-04 ENCOUNTER — Other Ambulatory Visit: Payer: Self-pay | Admitting: Family Medicine

## 2016-08-04 ENCOUNTER — Telehealth: Payer: Self-pay | Admitting: Family Medicine

## 2016-08-04 MED ORDER — GABAPENTIN 100 MG PO CAPS: 300.0000 mg | ORAL_CAPSULE | Freq: Two times a day (BID) | ORAL | 1 refills | Status: DC

## 2016-08-04 NOTE — Telephone Encounter (Signed)
Pt aware Rx sent to pharmacy 

## 2016-08-04 NOTE — Telephone Encounter (Signed)
What is the name of the medication? Gabapentin. Stacks increased her doseage and she has run out. She needs a new rx for this at the new dose.  Have you contacted your pharmacy to request a refill? yes  Which pharmacy would you like this sent to? [facility]   Patient notified that their request is being sent to the clinical staff for review and that they should receive a call once it is complete. If they do not receive a call within 24 hours they can check with their pharmacy or our office.

## 2016-08-04 NOTE — Telephone Encounter (Signed)
I sent in the requested prescription 

## 2016-08-13 ENCOUNTER — Other Ambulatory Visit: Payer: Self-pay | Admitting: Family Medicine

## 2016-08-25 ENCOUNTER — Ambulatory Visit (INDEPENDENT_AMBULATORY_CARE_PROVIDER_SITE_OTHER): Payer: PPO | Admitting: Family Medicine

## 2016-08-25 VITALS — BP 135/65 | HR 84 | Temp 100.2°F | Ht 59.0 in

## 2016-08-25 DIAGNOSIS — Z794 Long term (current) use of insulin: Secondary | ICD-10-CM | POA: Diagnosis not present

## 2016-08-25 DIAGNOSIS — J029 Acute pharyngitis, unspecified: Secondary | ICD-10-CM | POA: Diagnosis not present

## 2016-08-25 DIAGNOSIS — R509 Fever, unspecified: Secondary | ICD-10-CM

## 2016-08-25 DIAGNOSIS — E1121 Type 2 diabetes mellitus with diabetic nephropathy: Secondary | ICD-10-CM

## 2016-08-25 LAB — CULTURE, GROUP A STREP

## 2016-08-25 LAB — RAPID STREP SCREEN (MED CTR MEBANE ONLY): Strep Gp A Ag, IA W/Reflex: NEGATIVE

## 2016-08-25 LAB — VERITOR FLU A/B WAIVED
Influenza A: NEGATIVE
Influenza B: NEGATIVE

## 2016-08-25 MED ORDER — BETAMETHASONE SOD PHOS & ACET 6 (3-3) MG/ML IJ SUSP
6.0000 mg | Freq: Once | INTRAMUSCULAR | Status: AC
  Administered 2016-08-25: 6 mg via INTRAMUSCULAR

## 2016-08-25 MED ORDER — AMOXICILLIN-POT CLAVULANATE 500-125 MG PO TABS
1.0000 | ORAL_TABLET | Freq: Two times a day (BID) | ORAL | 0 refills | Status: AC
Start: 2016-08-25 — End: 2016-09-04

## 2016-08-25 NOTE — Progress Notes (Signed)
Subjective:  Patient ID: [name], female    DOB: 09-May-1925  Age: 83 y.o. MRN: [id number]  CC: Sore Throat (pt here today c/o sore throat since Friday. It has continued to get worse and no pt states "she can't swallow" because it hurts so bad.)   HPI [name] presents for Sore throat onset 4 days ago. Became severe starting yesterday. Didn't get any sleep last night due to pain. Of note is that it's been very hard for her to swallow so she hasn't been drinking any fluids or eating any food. She is exhausted. She has been having some chills and sweats with fever. Temperature noted elevated here today. Blood sugar has been running in the 115 range mostly. No lows. The highest has been 150.  History [name] has a past medical history of Diabetes mellitus without complication ([place]); Frequent UTI; Hyperlipidemia; Hypertension; and Neck pain.   She has a past surgical history that includes Appendectomy; Cholecystectomy; Tonsillectomy; and Joint replacement.   Her family history includes Cancer in her brother, father, and mother.She reports that she has never smoked. She has never used smokeless tobacco. She reports that she does not drink alcohol or use drugs.  Current Outpatient Prescriptions on File Prior to Visit  Medication Sig Dispense Refill  . amLODipine (NORVASC) 2.5 MG tablet TAKE ONE TABLET BY MOUTH ONCE DAILY 30 tablet 4  . Aspirin-Caffeine (BAYER BACK & BODY PAIN EX ST PO) Take 1 tablet by mouth daily as needed (pain).     . Calcium Carbonate-Vit D-Min (CALCIUM 1200 PO) Take 1 tablet by mouth every other day.     . Cholecalciferol (VITAMIN D3) 5000 units CAPS Take 1 capsule by mouth daily.    Marland Kitchen diltiazem (CARTIA XT) 180 MG 24 hr capsule Take 1 capsule (180 mg total) by mouth daily. 90 capsule 0  . gabapentin (NEURONTIN) 100 MG capsule Take 3 capsules (300 mg total) by mouth 2 (two) times daily. 540 capsule 1  . glucose blood test strip Use as instructed 100 each 0  .  Insulin Glargine (LANTUS SOLOSTAR) 100 UNIT/ML Solostar Pen INJECT 21 UNITS SUBCUTANEOUSLY ONCE DAILY AT 9 PM as directed. 15 mL 11  . Insulin Pen Needle 31G X 8 MM MISC Use once daily with lantus solostar 100 each 6  . Magnesium 250 MG TABS Take 2 tablets (500 mg total) by mouth 2 (two) times daily. 120 tablet 11  . meloxicam (MOBIC) 15 MG tablet Take 15 mg by mouth daily.     . metFORMIN (GLUCOPHAGE) 500 MG tablet TAKE ONE TABLET BY MOUTH ONCE DAILY WITH BREAKFAST 90 tablet 0  . omeprazole (PRILOSEC) 20 MG capsule TAKE ONE CAPSULE BY MOUTH TWICE DAILY BEFORE MEAL(S) 180 capsule 1   No current facility-administered medications on file prior to visit.     ROS Review of Systems  Constitutional: Positive for activity change, appetite change, chills, fatigue and fever.  HENT: Positive for drooling, postnasal drip and sore throat. Negative for congestion, ear pain, mouth sores and sneezing.   Respiratory: Negative for cough, choking and shortness of breath.   Neurological: Positive for headaches.  Psychiatric/Behavioral: Positive for sleep disturbance.    Objective:  BP 135/65   Pulse 84   Temp 100.2 F (37.9 C) (Oral)   Ht _0  (1.499 m)   Physical Exam  Constitutional: She appears well-developed and well-nourished. She appears distressed (mild, from lack of sleep and proper nutrition for the last few days).  HENT:  Head: Normocephalic and atraumatic.  Right Ear: Tympanic membrane and external ear normal. No decreased hearing is noted.  Left Ear: Tympanic membrane and external ear normal. No decreased hearing is noted.  Nose: Mucosal edema present. Right sinus exhibits no frontal sinus tenderness. Left sinus exhibits no frontal sinus tenderness.  Mouth/Throat: Oropharyngeal exudate (with erythema) present. No posterior oropharyngeal erythema.  Eyes: Conjunctivae are normal. Pupils are equal, round, and reactive to light.  Neck: Normal range of motion. No Brudzinski's sign noted.    Cardiovascular: Normal rate.   Pulmonary/Chest: Breath sounds normal. No respiratory distress.  Lymphadenopathy:       Head (right side): No preauricular adenopathy present.       Head (left side): No preauricular adenopathy present.    She has cervical adenopathy.       Right cervical: No superficial cervical adenopathy present.      Left cervical: No superficial cervical adenopathy present.    Assessment & Plan:   [name] was seen today for sore throat.  Diagnoses and all orders for this visit:  Sore throat -     Rapid strep screen (not at [facility]) -     betamethasone acetate-betamethasone sodium phosphate (CELESTONE) injection 6 mg; Inject 1 mL (6 mg total) into the muscle once. -     Upper Respiratory Culture, Routine  Fever, unspecified fever cause -     Veritor Flu A/B Waived -     CBC with Differential/Platelet -     CMP14+EGFR  Acute pharyngitis, unspecified etiology -     CBC with Differential/Platelet -     CMP14+EGFR  Controlled type 2 diabetes mellitus with diabetic nephropathy, with long-term current use of insulin ([facility]) -     CBC with Differential/Platelet -     CMP14+EGFR  Other orders -     amoxicillin-clavulanate (AUGMENTIN) 500-125 MG tablet; Take 1 tablet (500 mg total) by mouth 2 (two) times daily. Take with food. Take all of the pills   I have discontinued [name]'s amoxicillin-clavulanate. I am also having her start on amoxicillin-clavulanate. Additionally, I am having her maintain her Aspirin-Caffeine (BAYER BACK & BODY PAIN EX ST PO), Calcium Carbonate-Vit D-Min (CALCIUM 1200 PO), Vitamin D3, meloxicam, Insulin Glargine, Magnesium, Insulin Pen Needle, amLODipine, omeprazole, metFORMIN, diltiazem, glucose blood, and gabapentin. We will continue to administer betamethasone acetate-betamethasone sodium phosphate.  Meds ordered this encounter  Medications  . betamethasone acetate-betamethasone sodium phosphate (CELESTONE) injection 6 mg  .  amoxicillin-clavulanate (AUGMENTIN) 500-125 MG tablet    Sig: Take 1 tablet (500 mg total) by mouth 2 (two) times daily. Take with food. Take all of the pills    Dispense:  20 tablet    Refill:  0   Patient has pharyngitis with toxicity. I'm concerned for heart band stage that she is at risk for dehydration. Therefore we'll go ahead with [place] 2 decrease the inflammation and allow her to take fluids. She is going to cut her Lantus dose to one half tonight unless she is able to eat a good supper. Consider that tomorrow if she is unable to eat tomorrow as well. If she is not back on a regular diet in 2 days she's going to notify me.  Follow-up: Return if symptoms worsen or fail to improve and as usual for diabetes care.  Claretta Fraise, M.D.

## 2016-08-26 ENCOUNTER — Telehealth: Payer: Self-pay | Admitting: Family Medicine

## 2016-08-26 ENCOUNTER — Other Ambulatory Visit: Payer: Self-pay | Admitting: Family Medicine

## 2016-08-26 LAB — CMP14+EGFR
ALT: 26 IU/L (ref 0–32)
AST: 19 IU/L (ref 0–40)
Albumin/Globulin Ratio: 1.3 (ref 1.2–2.2)
Albumin: 3.9 g/dL (ref 3.5–4.7)
Alkaline Phosphatase: 85 IU/L (ref 39–117)
BUN/Creatinine Ratio: 18 (ref 12–28)
BUN: 13 mg/dL (ref 8–27)
Bilirubin Total: 0.4 mg/dL (ref 0.0–1.2)
CALCIUM: 8.9 mg/dL (ref 8.7–10.3)
CO2: 25 mmol/L (ref 18–29)
CREATININE: 0.72 mg/dL (ref 0.57–1.00)
Chloride: 102 mmol/L (ref 96–106)
GFR, EST AFRICAN AMERICAN: 86 mL/min/{1.73_m2} (ref >59)
GFR, EST NON AFRICAN AMERICAN: 75 mL/min/{1.73_m2} (ref >59)
Globulin, Total: 2.9 g/dL (ref 1.5–4.5)
Glucose: 119 mg/dL — ABNORMAL HIGH (ref 65–99)
Potassium: 3.9 mmol/L (ref 3.5–5.2)
Sodium: 143 mmol/L (ref 134–144)
TOTAL PROTEIN: 6.8 g/dL (ref 6.0–8.5)

## 2016-08-26 LAB — CBC WITH DIFFERENTIAL/PLATELET
BASOS: 0 %
Basophils Absolute: 0.1 10*3/uL (ref 0.0–0.2)
EOS (ABSOLUTE): 0.3 10*3/uL (ref 0.0–0.4)
EOS: 2 %
Hematocrit: 40.1 % (ref 34.0–46.6)
Hemoglobin: 12.7 g/dL (ref 11.1–15.9)
IMMATURE GRANS (ABS): 0 10*3/uL (ref 0.0–0.1)
IMMATURE GRANULOCYTES: 0 %
LYMPHS: 7 %
Lymphocytes Absolute: 0.9 10*3/uL (ref 0.7–3.1)
MCH: 25.6 pg — ABNORMAL LOW (ref 26.6–33.0)
MCHC: 31.7 g/dL (ref 31.5–35.7)
MCV: 81 fL (ref 79–97)
MONOS ABS: 1.3 10*3/uL — AB (ref 0.1–0.9)
Monocytes: 9 %
NEUTROS PCT: 82 %
Neutrophils Absolute: 11.4 10*3/uL — ABNORMAL HIGH (ref 1.4–7.0)
PLATELETS: 271 10*3/uL (ref 150–379)
RBC: 4.96 x10E6/uL (ref 3.77–5.28)
RDW: 15.8 % — AB (ref 12.3–15.4)
WBC: 13.9 10*3/uL — AB (ref 3.4–10.8)

## 2016-08-26 MED ORDER — MAGIC MOUTHWASH W/LIDOCAINE
5.0000 mL | Freq: Four times a day (QID) | ORAL | 0 refills | Status: DC | PRN
Start: 2016-08-26 — End: 2016-10-14

## 2016-08-26 NOTE — Telephone Encounter (Signed)
Rx called to pharmacy and patient aware 

## 2016-08-26 NOTE — Telephone Encounter (Signed)
I wrote for Magic mouthwash, but has to be called in. Thanks, WS

## 2016-08-26 NOTE — Telephone Encounter (Signed)
Patient called stating that she has a sore throat and is having swallowing medication.  Patient has tried chloraseptic spray with no relief.  Patient would like to know if she can have something sent to her pharmacy to help numb her throat

## 2016-08-27 LAB — UPPER RESPIRATORY CULTURE, ROUTINE

## 2016-08-28 ENCOUNTER — Ambulatory Visit: Payer: PPO

## 2016-08-29 ENCOUNTER — Ambulatory Visit: Payer: PPO | Admitting: Family Medicine

## 2016-09-04 ENCOUNTER — Telehealth: Payer: Self-pay | Admitting: Family Medicine

## 2016-09-04 ENCOUNTER — Other Ambulatory Visit: Payer: Self-pay | Admitting: Family Medicine

## 2016-09-04 DIAGNOSIS — L84 Corns and callosities: Secondary | ICD-10-CM | POA: Diagnosis not present

## 2016-09-04 DIAGNOSIS — B351 Tinea unguium: Secondary | ICD-10-CM | POA: Diagnosis not present

## 2016-09-04 DIAGNOSIS — E1142 Type 2 diabetes mellitus with diabetic polyneuropathy: Secondary | ICD-10-CM | POA: Diagnosis not present

## 2016-09-04 DIAGNOSIS — M79676 Pain in unspecified toe(s): Secondary | ICD-10-CM | POA: Diagnosis not present

## 2016-09-04 MED ORDER — ROPINIROLE HCL 0.25 MG PO TABS: 0.2500 mg | ORAL_TABLET | Freq: Three times a day (TID) | ORAL | 1 refills | Status: DC

## 2016-09-04 NOTE — Telephone Encounter (Signed)
Please contact the patient: scrip sent. Shoulld help.

## 2016-09-04 NOTE — Telephone Encounter (Signed)
Patient states she was given magnesium for leg cramps and it worked for [employer]. States that the last week she has been waking up with calf cramps. Wanting to know what to do. Please advise.

## 2016-09-05 NOTE — Telephone Encounter (Signed)
Patient aware.

## 2016-09-10 ENCOUNTER — Other Ambulatory Visit: Payer: Self-pay | Admitting: Family Medicine

## 2016-09-11 ENCOUNTER — Other Ambulatory Visit: Payer: Self-pay | Admitting: Family Medicine

## 2016-09-27 ENCOUNTER — Other Ambulatory Visit: Payer: Self-pay | Admitting: Family Medicine

## 2016-09-27 DIAGNOSIS — I10 Essential (primary) hypertension: Secondary | ICD-10-CM

## 2016-10-03 ENCOUNTER — Other Ambulatory Visit: Payer: Self-pay | Admitting: Family Medicine

## 2016-10-14 ENCOUNTER — Ambulatory Visit (INDEPENDENT_AMBULATORY_CARE_PROVIDER_SITE_OTHER): Payer: PPO | Admitting: Family Medicine

## 2016-10-14 VITALS — BP 132/69 | HR 77 | Ht 59.0 in | Wt 158.0 lb

## 2016-10-14 DIAGNOSIS — I10 Essential (primary) hypertension: Secondary | ICD-10-CM | POA: Diagnosis not present

## 2016-10-14 DIAGNOSIS — E1142 Type 2 diabetes mellitus with diabetic polyneuropathy: Secondary | ICD-10-CM

## 2016-10-14 DIAGNOSIS — J8410 Pulmonary fibrosis, unspecified: Secondary | ICD-10-CM | POA: Diagnosis not present

## 2016-10-14 DIAGNOSIS — K219 Gastro-esophageal reflux disease without esophagitis: Secondary | ICD-10-CM | POA: Diagnosis not present

## 2016-10-14 DIAGNOSIS — E782 Mixed hyperlipidemia: Secondary | ICD-10-CM

## 2016-10-14 DIAGNOSIS — M150 Primary generalized (osteo)arthritis: Secondary | ICD-10-CM | POA: Diagnosis not present

## 2016-10-14 DIAGNOSIS — M159 Polyosteoarthritis, unspecified: Secondary | ICD-10-CM

## 2016-10-14 LAB — BAYER DCA HB A1C WAIVED: HB A1C (BAYER DCA - WAIVED): 7.3 % — ABNORMAL HIGH (ref <7.0)

## 2016-10-14 NOTE — Progress Notes (Signed)
Subjective:  Patient ID: [name], female    DOB: [birth year]  Age: 83 y.o. MRN: [id number]  CC: Diabetes (pt here today for routine follow up of her chronic medical conditions)   HPI [name] presents for  follow-up of hypertension. Patient has no history of headache chest pain or shortness of breath or recent cough. Patient also denies symptoms of TIA such as numbness weakness lateralizing. Patient checks  blood pressure at home and has not had any elevated readings recently. Patient denies side effects from medication. States taking it regularly.  Patient also being followed for pulmonary fibrosis. Denies shortness of breath with her usual activities. Some with increased exertion.  Patient in for follow-up of elevated cholesterol. Doing well without complaints on current medication. Denies side effects of statin including myalgia and arthralgia and nausea. Also in today for liver function testing. Currently no chest pain, shortness of breath or other cardiovascular related symptoms noted.  Patient in for follow-up of GERD. Currently asymptomatic taking  PPI daily. There is no chest pain or heartburn. No hematemesis and no melena. No dysphagia or choking. Onset is remote. Progression is stable. Complicating factors, none.   Continues to have arthritis pain. Able to function usually without difficulty.  Follow-up of diabetes. Patient does not check blood sugar at home Patient denies symptoms such as polyuria, polydipsia, excessive hunger, nausea No significant hypoglycemic spells noted. Medications as noted below. Taking them regularly without complication/adverse reaction being reported today.    History [name] has a past medical history of Diabetes mellitus without complication ([place]); Frequent UTI; Hyperlipidemia; Hypertension; and Neck pain.   She has a past surgical history that includes Appendectomy; Cholecystectomy; Tonsillectomy; and Joint replacement.   Her family  history includes Cancer in her brother, father, and mother.She reports that she has never smoked. She has never used smokeless tobacco. She reports that she does not drink alcohol or use drugs.  Current Outpatient Prescriptions on File Prior to Visit  Medication Sig Dispense Refill  . amLODipine (NORVASC) 2.5 MG tablet Take 1 Tablet by mouth once daily 30 tablet 4  . Aspirin-Caffeine (BAYER BACK & BODY PAIN EX ST PO) Take 1 tablet by mouth daily as needed (pain).     . Calcium Carbonate-Vit D-Min (CALCIUM 1200 PO) Take 1 tablet by mouth every other day.     . Cholecalciferol (VITAMIN D3) 5000 units CAPS Take 1 capsule by mouth daily.    Marland Kitchen diltiazem (CARDIZEM CD) 180 MG 24 hr capsule Take 1 Capsule by mouth once daily 90 capsule 0  . gabapentin (NEURONTIN) 100 MG capsule Take 3 capsules (300 mg total) by mouth 2 (two) times daily. 540 capsule 1  . Insulin Glargine (LANTUS SOLOSTAR) 100 UNIT/ML Solostar Pen INJECT 21 UNITS SUBCUTANEOUSLY ONCE DAILY AT 9 PM as directed. 15 mL 11  . Insulin Pen Needle 31G X 8 MM MISC Use once daily with lantus solostar 100 each 6  . Magnesium 250 MG TABS Take 2 tablets (500 mg total) by mouth 2 (two) times daily. 120 tablet 11  . meloxicam (MOBIC) 15 MG tablet Take 15 mg by mouth daily.     . metFORMIN (GLUCOPHAGE) 500 MG tablet TAKE ONE TABLET BY MOUTH ONCE DAILY WITH BREAKFAST 90 tablet 0  . nitrofurantoin, macrocrystal-monohydrate, (MACROBID) 100 MG capsule Take 1 Capsule by mouth at bedtime 30 capsule 5  . omeprazole (PRILOSEC) 20 MG capsule Take 1 Capsule by mouth 2 times a day BEFORE meals 180 capsule 1  .  ONE TOUCH ULTRA TEST test strip USE TO check blood sugars twice daily 100 each 12  . rOPINIRole (REQUIP) 0.25 MG tablet Take 1 tablet (0.25 mg total) by mouth 3 (three) times daily. For leg cramps 90 tablet 1   No current facility-administered medications on file prior to visit.     ROS Review of Systems  Constitutional: Negative for activity change,  appetite change and fever.  HENT: Negative for congestion, rhinorrhea and sore throat.   Eyes: Negative for visual disturbance.  Respiratory: Negative for cough and shortness of breath.   Cardiovascular: Negative for chest pain and palpitations.  Gastrointestinal: Negative for abdominal pain, diarrhea and nausea.  Genitourinary: Negative for dysuria.  Musculoskeletal: Positive for arthralgias and myalgias.    Objective:  BP 132/69   Pulse 77   Ht _0  (1.499 m)   Wt 158 lb (71.7 kg)   BMI 31.91 kg/m   BP Readings from Last 3 Encounters:  10/14/16 132/69  08/25/16 135/65  07/02/16 135/77    Wt Readings from Last 3 Encounters:  10/14/16 158 lb (71.7 kg)  07/02/16 160 lb (72.6 kg)  03/31/16 158 lb (71.7 kg)     Physical Exam  Constitutional: She is oriented to person, place, and time. She appears well-developed and well-nourished. No distress.  HENT:  Head: Normocephalic and atraumatic.  Right Ear: External ear normal.  Left Ear: External ear normal.  Nose: Nose normal.  Mouth/Throat: Oropharynx is clear and moist.  Eyes: Conjunctivae and EOM are normal. Pupils are equal, round, and reactive to light.  Neck: Normal range of motion. Neck supple. No thyromegaly present.  Cardiovascular: Normal rate, regular rhythm and normal heart sounds.   No murmur heard. Pulmonary/Chest: Effort normal and breath sounds normal. No respiratory distress. She has no wheezes. She has no rales.  Abdominal: Soft. Bowel sounds are normal. She exhibits no distension. There is no tenderness.  Lymphadenopathy:    She has no cervical adenopathy.  Neurological: She is alert and oriented to person, place, and time. She has normal reflexes.  Skin: Skin is warm and dry.  Psychiatric: She has a normal mood and affect. Her behavior is normal. Judgment and thought content normal.     Lab Results  Component Value Date   WBC 13.9 (H) 08/25/2016   HGB 12.7 08/25/2016   HCT 40.1 08/25/2016   PLT  271 08/25/2016   GLUCOSE 183 (H) 10/14/2016   CHOL 249 (H) 10/14/2016   TRIG 183 (H) 10/14/2016   HDL 42 10/14/2016   LDLCALC 170 (H) 10/14/2016   ALT 28 10/14/2016   AST 21 10/14/2016   NA 141 10/14/2016   K 4.4 10/14/2016   CL 101 10/14/2016   CREATININE 0.72 10/14/2016   BUN 23 10/14/2016   CO2 24 10/14/2016   TSH 1.530 10/14/2016   INR 1.9 (H) 05/22/2007   HGBA1C 6.8 07/03/2015   MICROALBUR neg 07/15/2013    US Venous Img Lower Unilateral Left  Result Date: 07/13/2014 CLINICAL DATA:  Edema EXAM: LEFT LOWER EXTREMITY VENOUS DUPLEX ULTRASOUND TECHNIQUE: Doppler venous assessment of the left lower extremity deep venous system was performed, including characterization of spectral flow, compressibility, and phasicity. COMPARISON:  None. FINDINGS: There is complete compressibility of the left common femoral, femoral, and popliteal veins. Doppler analysis demonstrates respiratory phasicity and augmentation of flow upon calf compression. No evidence of superficial vein or calf vein thrombosis. IMPRESSION: No evidence of left lower extremity DVT. Electronically Signed   By: Marybelle Killings  M.D.   On: 07/13/2014 14:12    Assessment & Plan:   [place] was seen today for diabetes.  Diagnoses and all orders for this visit:  Diabetic polyneuropathy associated with type 2 diabetes mellitus ([place]) -     Bayer DCA Hb A1c Waived -     Lipid panel -     CMP14+EGFR -     Cancel: TSH + free T4 -     TSH  Essential hypertension -     CMP14+EGFR  PULMONARY FIBROSIS, POSTINFLAMMATORY -     CMP14+EGFR  Gastroesophageal reflux disease without esophagitis -     CMP14+EGFR  Primary osteoarthritis involving multiple joints -     CMP14+EGFR  Mixed hyperlipidemia -     Lipid panel -     CMP14+EGFR   I have discontinued Ms. Vachon's magic mouthwash w/lidocaine. I am also having her maintain her Aspirin-Caffeine (BAYER BACK & BODY PAIN EX ST PO), Calcium Carbonate-Vit D-Min (CALCIUM 1200 PO),  Vitamin D3, meloxicam, Insulin Glargine, Magnesium, Insulin Pen Needle, metFORMIN, gabapentin, rOPINIRole, omeprazole, nitrofurantoin (macrocrystal-monohydrate), ONE TOUCH ULTRA TEST, amLODipine, and diltiazem.  No orders of the defined types were placed in this encounter.   ollow-up: Return in about 3 months (around 01/14/2017).  Claretta Fraise, M.D.

## 2016-10-15 LAB — CMP14+EGFR
A/G RATIO: 1.6 (ref 1.2–2.2)
ALBUMIN: 4.1 g/dL (ref 3.5–4.7)
ALK PHOS: 83 IU/L (ref 39–117)
ALT: 28 IU/L (ref 0–32)
AST: 21 IU/L (ref 0–40)
BUN / CREAT RATIO: 32 — AB (ref 12–28)
BUN: 23 mg/dL (ref 8–27)
Bilirubin Total: 0.3 mg/dL (ref 0.0–1.2)
CO2: 24 mmol/L (ref 20–29)
CREATININE: 0.72 mg/dL (ref 0.57–1.00)
Calcium: 9.5 mg/dL (ref 8.7–10.3)
Chloride: 101 mmol/L (ref 96–106)
GFR calc Af Amer: 86 mL/min/{1.73_m2} (ref >59)
GFR calc non Af Amer: 75 mL/min/{1.73_m2} (ref >59)
GLOBULIN, TOTAL: 2.6 g/dL (ref 1.5–4.5)
Glucose: 183 mg/dL — ABNORMAL HIGH (ref 65–99)
POTASSIUM: 4.4 mmol/L (ref 3.5–5.2)
SODIUM: 141 mmol/L (ref 134–144)
Total Protein: 6.7 g/dL (ref 6.0–8.5)

## 2016-10-15 LAB — LIPID PANEL
CHOLESTEROL TOTAL: 249 mg/dL — AB (ref 100–199)
Chol/HDL Ratio: 5.9 ratio — ABNORMAL HIGH (ref 0.0–4.4)
HDL: 42 mg/dL (ref >39)
LDL CALC: 170 mg/dL — AB (ref 0–99)
TRIGLYCERIDES: 183 mg/dL — AB (ref 0–149)
VLDL CHOLESTEROL CAL: 37 mg/dL (ref 5–40)

## 2016-10-15 LAB — TSH: TSH: 1.53 u[IU]/mL (ref 0.450–4.500)

## 2016-10-22 ENCOUNTER — Encounter: Payer: Self-pay | Admitting: Pharmacist

## 2016-10-22 ENCOUNTER — Ambulatory Visit (INDEPENDENT_AMBULATORY_CARE_PROVIDER_SITE_OTHER): Payer: PPO | Admitting: Pharmacist

## 2016-10-22 VITALS — BP 136/80 | HR 66 | Ht 59.0 in | Wt 159.0 lb

## 2016-10-22 DIAGNOSIS — E782 Mixed hyperlipidemia: Secondary | ICD-10-CM

## 2016-10-22 DIAGNOSIS — E1121 Type 2 diabetes mellitus with diabetic nephropathy: Secondary | ICD-10-CM | POA: Diagnosis not present

## 2016-10-22 DIAGNOSIS — I10 Essential (primary) hypertension: Secondary | ICD-10-CM

## 2016-10-22 DIAGNOSIS — Z794 Long term (current) use of insulin: Secondary | ICD-10-CM | POA: Diagnosis not present

## 2016-10-22 MED ORDER — DILTIAZEM HCL ER COATED BEADS 240 MG PO CP24: 240.0000 mg | ORAL_CAPSULE | Freq: Every day | ORAL | 1 refills | Status: DC

## 2016-10-22 NOTE — Progress Notes (Signed)
Patient ID: [name], female   DOB: February 12, 1926, 83 y.o.   MRN: [id number]   Subjective:    [name] is a 83 y.o. female who presents for education for type 2 DM and cholesterol.   Patient has had DM treated with insulin for about 7 years.  She also has PMH of stroke in her 65's.  She has hyperlipidemia but has refused statin medication in the past.   [name] also is concerned about edema in both lower extremities.  She was told by her son that he had experienced edema with amlodipine and she is concerned that amlodipine is making her edema worse.   Current symptoms/problems include hyperglycemia, hypoglycemia  and paresthesia of the feet and have been unchanged. Symptoms have been present for 2 months.  Current diabetic medications include oral agent (monotherapy): metformin (generic) and insulin injections: Lantus : 21 units qpm.   Current monitoring regimen: home blood tests - once daily Home blood sugar records: ranged from 112 to 195 (fasting blood glucose) Any episodes of hypoglycemia? yes - has about 2-3 episodes per month - usually during sleep - patient awakes feeling shaky and very hungry.  Usually treats with milk, cheese or almonds but she is now concerned that these might be increasing her cholesterol.   Known diabetic complications: peripheral neuropathy Cardiovascular risk factors: advanced age (older than 66 for men, 48 for women), diabetes mellitus, dyslipidemia, hypertension, microalbuminuria, obesity (BMI >= 30 kg/m2) and sedentary lifestyle Eye exam current (within one year): yes Weight trend: stable Prior visit with CDE: no Current diet: in general, a "healthy" diet   Current exercise: none Medication Compliance?  Yes   Is She on ACE inhibitor or angiotensin II receptor blocker?  No - has had ACE cough in past.    Objective:    BP 136/80   Pulse 66   Ht 4\' 11"  (1.499 m)   Wt 159 lb (72.1 kg)   BMI 32.11 kg/m    A1c = 7.3%  Trace edema  bilaterally  Lab Review Glucose (mg/dL)  Date Value  10/14/2016 183 (H)  08/25/2016 119 (H)  07/02/2016 76   Creatinine, Ser (mg/dL)  Date Value  10/14/2016 0.72  08/25/2016 0.72  07/02/2016 0.83      Assessment:    Diabetes Mellitus type 2 requiring insulin therapy.  Controlled per A1c but experiencing hypoglycemia Hyperlipidemia  Edema - possibly related to amlodipine HTN at goal today   Plan:    1.  Rx changes: D/C amlodipine.  Increase diltiazem XR to 240mg  qd    Pt continues to refuse statin therapy 2.  Education - s/s of hypoglycemia.  Discussed treatment and also preventative measure (eating 15 gram CHO + protein snack in pm) 3. Continuous Glucose monitor [facility] Libre Professional CGM) was placed today to try to get better understanding of BG trends.  Patient educated about proper care of CGM and site.  CGM was placed on back of upper left arm.  Patient will wear at least 5 days and up to 14 days.  She is to keep BG, diet and insulin administration records. She can engage in regular activities with special care when showering, changing clothes and swimming (only up to 3 feet and for 30 minutes at a time)  4. Discussed diet - suggested Mediterranean Diet with lots of fruits and vegetables, increase whole grains and beans (complex CHOs) limit red meat, increase fish, limit refined sugar / sweets, limit saturated and trans fat, moderate  amounts of monounsaturated fat and polyunsaturated fat  5.   RTC in 2 weeks for AWV and removed CGM

## 2016-10-22 NOTE — Patient Instructions (Signed)
Continuous Glucose monitor was placed today to try to get better understanding of blood glucose trends. This monitor  was placed on the back of your upper left arm.  You will wear at least 5 days and up to 14 days.    Keep records of your blood glucose readings, what you have eaten at meal and how much insulin you are giving each day.    You can engage in regular activities with special care when showering, changing clothes and swimming (only up to 3 feet and for 30 minutes at a time)  Stop amlodipine to see if swelling in ankles improves.  I have sent in prescription for diltiazem 240mg  take 1 capsule a day

## 2016-10-23 ENCOUNTER — Other Ambulatory Visit: Payer: Self-pay | Admitting: Family Medicine

## 2016-10-29 ENCOUNTER — Telehealth: Payer: Self-pay | Admitting: Family Medicine

## 2016-10-29 DIAGNOSIS — H9193 Unspecified hearing loss, bilateral: Secondary | ICD-10-CM

## 2016-10-29 NOTE — Telephone Encounter (Signed)
Patient states that she called an audiologist about adjusting the hearing aid she was given by a friend.  Cost would be several hundred because she does not accept her insurance.  I recommended that she call [facility] to find out which audiologist is in their network.

## 2016-10-31 ENCOUNTER — Other Ambulatory Visit: Payer: Self-pay | Admitting: Family Medicine

## 2016-11-04 ENCOUNTER — Ambulatory Visit (INDEPENDENT_AMBULATORY_CARE_PROVIDER_SITE_OTHER): Payer: PPO | Admitting: Pharmacist

## 2016-11-04 ENCOUNTER — Ambulatory Visit (INDEPENDENT_AMBULATORY_CARE_PROVIDER_SITE_OTHER): Payer: PPO

## 2016-11-04 ENCOUNTER — Encounter: Payer: Self-pay | Admitting: Pharmacist

## 2016-11-04 VITALS — BP 126/68 | HR 76 | Ht 59.0 in | Wt 160.0 lb

## 2016-11-04 DIAGNOSIS — Z0000 Encounter for general adult medical examination without abnormal findings: Secondary | ICD-10-CM | POA: Diagnosis not present

## 2016-11-04 DIAGNOSIS — Z780 Asymptomatic menopausal state: Secondary | ICD-10-CM

## 2016-11-04 DIAGNOSIS — Z853 Personal history of malignant neoplasm of breast: Secondary | ICD-10-CM

## 2016-11-04 DIAGNOSIS — M810 Age-related osteoporosis without current pathological fracture: Secondary | ICD-10-CM | POA: Insufficient documentation

## 2016-11-04 NOTE — Addendum Note (Signed)
Addended by: Cherre Robins B on: 11/04/2016 05:13 PM   Modules accepted: Level of Service

## 2016-11-04 NOTE — Progress Notes (Addendum)
Patient ID: [name], female   DOB: April 04, 1926, 83 y.o.   MRN: [id number]     Subjective:   [name] is a 83 y.o. female who presents for an Initial Medicare Annual Wellness Visit  We also placed a Freestyle Libre Pro Continuous Glucose Monitor about 13 days ago.  Downloaded results and reviewed with patient.  We were concerned about her having frequent hypolycemic episodes but she reports that this has improved since she has been having a small snack of about 15 grams of CHO in the evening.   CGM Report:  avg BG = 136 14% of readings above target  86% of readings within target range 0% of reading below target range (has 1 low which occurred today and lasted about 45 minutes) Coefficient of Variation = 27.4%  Trends:  Elevated BG post prandially - usually highest after breakfast. Also elevated BG post evening meal but not as substantial as after breakfast.   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 Marital history:  Widowed for 5 years Has 2 sons.  Continues to see family and friends frequently.   Alcohol/Tobacco/Substances: none   Current Medications (verified) Outpatient Encounter Prescriptions as of 11/04/2016  Medication Sig  . Aspirin-Caffeine (BAYER BACK & BODY PAIN EX ST PO) Take 1 tablet by mouth daily as needed (pain).   . Calcium Carbonate-Vit D-Min (CALCIUM 1200 PO) Take 1 tablet by mouth every other day.   . Cholecalciferol (VITAMIN D3) 5000 units CAPS Take 1 capsule by mouth daily.  . Cyanocobalamin (VITAMIN B 12 PO) Take 1 tablet by mouth daily.  Marland Kitchen diltiazem (CARTIA XT) 240 MG 24 hr capsule Take 1 capsule (240 mg total) by mouth daily.  Marland Kitchen gabapentin (NEURONTIN) 100 MG capsule Take 3 capsules (300 mg total) by mouth 2 (two) times daily.  . Insulin Pen Needle 31G X 8 MM MISC Use once daily with lantus solostar  . LANTUS SOLOSTAR 100 UNIT/ML Solostar Pen Inject 23 to 50  units subcutaneously once daily at 10 PM as directed  . Magnesium 250 MG TABS Take 2 tablets (500 mg total) by mouth 2 (two) times daily.  . meloxicam (MOBIC) 15 MG tablet Take 15 mg by mouth daily.   . metFORMIN (GLUCOPHAGE) 500 MG tablet Take 1 Tablet by mouth once daily with BREAKFAST  . nitrofurantoin, macrocrystal-monohydrate, (MACROBID) 100 MG capsule Take 1 Capsule by mouth at bedtime  . omeprazole (PRILOSEC) 20 MG capsule Take 1 Capsule by mouth 2 times a day BEFORE meals  . ONE TOUCH ULTRA TEST test strip USE TO check blood sugars twice daily  . rOPINIRole (REQUIP) 0.25 MG tablet take 1  Tablet by mouth 3 times daily for leg cramps   No facility-administered encounter medications on file as of 11/04/2016.     Allergies (verified) Ace inhibitors   History: Past Medical History:  Diagnosis Date  . Arthritis   . Cancer [facility])    Breast with lumpectomy and radiation  . Cataract   . Diabetes mellitus without complication ([place])   . Frequent UTI   . Hyperlipidemia   . Hypertension   . Neck pain   . Scoliosis   . Stroke [facility])    Past Surgical History:  Procedure Laterality Date  . APPENDECTOMY    . BREAST LUMPECTOMY Left   . CHOLECYSTECTOMY    . EYE SURGERY    . JOINT REPLACEMENT     bilat  knee   . REPLACEMENT TOTAL KNEE BILATERAL Bilateral   . TONSILLECTOMY     Family History  Problem Relation Age of Onset  . Cancer Mother        lung  . Arthritis Mother   . Heart disease Father        endocarditis  . Cancer Brother        lung, throat  . Alcohol abuse Brother    Social History   Occupational History  . Not on file.   Social History Main Topics  . Smoking status: Never Smoker  . Smokeless tobacco: Never Used  . Alcohol use No  . Drug use: No  . Sexual activity: No    Do you feel safe at home?  Yes Are there smokers in your home (other than you)? No  Dietary issues and exercise activities: Current Exercise Habits: The patient does not participate in  regular exercise at present  Current Dietary habits:  Patient prepares most meals herself - eats out form time to time.  Limits sweets due to diabetes.   Objective:    Today's Vitals   11/04/16 1459  BP: 126/68  Pulse: 76  Weight: 160 lb (72.6 kg)  Height: 4\' 11"  (1.499 m)  PainSc: 2   PainLoc: Hip   Body mass index is 32.32 kg/m.   DEXA results:  L2-L3 of spine = 0.5 Left neck of femur T-Score = -2.9  Activities of Daily Living In your present state of health, do you have any difficulty performing the following activities: 11/04/2016 12/17/2015  Hearing? Y N  Vision? N N  Difficulty concentrating or making decisions? N N  Walking or climbing stairs? N N  Dressing or bathing? N N  Doing errands, shopping? N N  Preparing Food and eating ? N -  Using the Toilet? N -  In the past six months, have you accidently leaked urine? N -  Do you have problems with loss of bowel control? N -  Managing your Medications? N -  Managing your Finances? N -  Housekeeping or managing your Housekeeping? N -  Some recent data might be hidden     Cardiac Risk Factors include: advanced age (>80men, >29 women);diabetes mellitus;dyslipidemia;obesity (BMI >30kg/m2);microalbuminuria;sedentary lifestyle  Depression Screen PHQ 2/9 Scores 11/04/2016 10/14/2016 08/25/2016 07/02/2016  PHQ - 2 Score 0 0 0 0  PHQ- 9 Score - - - -     Fall Risk Fall Risk  11/04/2016 10/14/2016 08/25/2016 07/02/2016 03/31/2016  Falls in the past year? No No No No No    Cognitive Function: MMSE - Mini Mental State Exam 11/04/2016  Orientation to time 5  Orientation to Place 5  Registration 3  Attention/ Calculation 5  Recall 3  Language- name 2 objects 2  Language- repeat 1  Language- follow 3 step command 3  Language- read & follow direction 1  Write a sentence 1  Copy design 1  Total score 30    Immunizations and Health Maintenance Immunization History  Administered Date(s) Administered  . Influenza Whole  03/07/2005  . Influenza,inj,Quad PF,36+ Mos 02/11/2013, 01/31/2014, 02/06/2015, 02/18/2016  . Pneumococcal Conjugate-13 07/11/2014  . Pneumococcal Polysaccharide-23 11/14/2015  . Tdap 01/04/2011  . Zoster 05/29/2012   Health Maintenance Due  Topic Date Due  . FOOT EXAM  07/30/2014  . OPHTHALMOLOGY EXAM  09/03/2015    Patient Care Team: Claretta Fraise, MD as PCP - General (Family Medicine) Steffanie Rainwater, DPM as Consulting Physician (Podiatry) Anthony Sar Eden,  MD as Consulting Physician (Optometry)  Indicate any recent Medical Services you may have received from other than [facility] providers in the past year (date may be approximate).    Assessment:    Annual Wellness Visit  Osteoporosis Diabetes   Screening Tests Health Maintenance  Topic Date Due  . FOOT EXAM  07/30/2014  . OPHTHALMOLOGY EXAM  09/03/2015  . INFLUENZA VACCINE  12/03/2016  . URINE MICROALBUMIN  03/31/2017  . HEMOGLOBIN A1C  04/15/2017  . DEXA SCAN  11/05/2018  . TETANUS/TDAP  01/03/2021  . PNA vac Low Risk Adult  Completed        Plan:   During the course of the visit [name] was educated and counseled about the following appropriate screening and preventive services:   Vaccines to include Pneumoccal, Influenza, Td and Shingles- declined Shingrix today   Colorectal cancer screening - no longer required due to pt age  Cardiovascular disease screening  Diabetes - no changes recommended.  Occasional elevated post prandial but given patient's age and risk of hypoglycemia if we are more aggressive, I would continue current threrapy.  Bone Denisty / Osteoporosis Screening- done today and reviewed. Discussed starting bisphosphonate and Prolia.  Discussed pro and cons and both therapies.  Patient wants to read more about each - provided material.  Continue calcium supplement - recommend 1200mg  calcium daily from supplementation and diet  Mammogram - patient declined  Glaucoma screening / Diabetic Eye Exam -  will request copy of last exam from Dr Marin Comment  Nutrition counseling - continue current diet - continue with evening snack to prevent hypoglycemia  Advanced Directives  Physical Activity  appt made today to follow up with PCP September 2018   Patient Instructions (the written plan) were given to the patient.   Cherre Robins, PharmD   11/04/2016     I have reviewed and agree with the above AWV documentation.  Claretta Fraise, M.D.

## 2016-11-04 NOTE — Patient Instructions (Addendum)
  [name] , Thank you for taking time to come for your Medicare Wellness Visit. I appreciate your ongoing commitment to your health goals. Please review the following plan we discussed and let me know if I can assist you in the future.   These are the goals we discussed:  Look for copy of [place] (important to know where these are kept - you can also bring copy to our office to be placed in our file / electronic chart)    Increase non-starchy vegetables - carrots, green bean, squash, zucchini, tomatoes, onions, peppers, spinach and other green leafy vegetables, cabbage, lettuce, cucumbers, asparagus, okra (not fried), eggplant Limit sugar and processed foods (cakes, cookies, ice cream, crackers and chips) Increase fresh fruit but limit serving sizes 1/2 cup or about the size of tennis or baseball Limit red meat to no more than 1-2 times per week (serving size about the size of your palm) Choose whole grains / lean proteins - whole wheat bread, quinoa, whole grain rice (1/2 cup), fish, chicken, [place] Avoid sugar and calorie containing beverages - soda, sweet tea and juice.  Choose water or unsweetened tea instead.    This is a list of the screening recommended for you and due dates:  Health Maintenance  Topic Date Due  . Complete foot exam   07/30/2014 - Sees Dr Irving Shows  . Eye exam for diabetics  09/03/2015  . Flu Shot  12/03/2016  . Urine Protein Check  03/31/2017  . Hemoglobin A1C  04/15/2017  . Tetanus Vaccine  01/03/2021  . DEXA scan (bone density measurement)  Completed today  . Pneumonia vaccines  Completed

## 2016-11-10 ENCOUNTER — Telehealth: Payer: Self-pay | Admitting: Pharmacist

## 2016-11-10 ENCOUNTER — Other Ambulatory Visit: Payer: Self-pay | Admitting: Pharmacist

## 2016-11-10 DIAGNOSIS — Z9181 History of falling: Secondary | ICD-10-CM

## 2016-11-10 DIAGNOSIS — M810 Age-related osteoporosis without current pathological fracture: Secondary | ICD-10-CM

## 2016-11-10 MED ORDER — ALENDRONATE SODIUM 70 MG PO TABS: 70.0000 mg | ORAL_TABLET | ORAL | 11 refills | Status: DC

## 2016-11-10 NOTE — Telephone Encounter (Signed)
Answered patient's questions about meds for osteoporosis treatment.   She decided that she did not want to take Prolia or Reclast due to cost.  I recommended that she try alendronate 70 mg take 1 tablet weekly - with 4 ounces of water and on empty stomach. Do not lie down or eat for 30 minutes.

## 2016-11-11 ENCOUNTER — Telehealth: Payer: Self-pay | Admitting: Family Medicine

## 2016-11-11 ENCOUNTER — Other Ambulatory Visit: Payer: Self-pay | Admitting: Pharmacist

## 2016-11-11 MED ORDER — ALENDRONATE SODIUM 70 MG PO TABS: 70.0000 mg | ORAL_TABLET | ORAL | 11 refills | Status: DC

## 2016-11-11 NOTE — Telephone Encounter (Signed)
Patient has picked up alendronate but she states she did not realize that was generic for Fosamax.  She does not want to take Fosamax or medications like Fosamax because she has had several friends that have had problems taking Fosamax.  She has decided not to take anything for bone health.

## 2016-11-13 DIAGNOSIS — M79676 Pain in unspecified toe(s): Secondary | ICD-10-CM | POA: Diagnosis not present

## 2016-11-13 DIAGNOSIS — B351 Tinea unguium: Secondary | ICD-10-CM | POA: Diagnosis not present

## 2016-11-13 DIAGNOSIS — L84 Corns and callosities: Secondary | ICD-10-CM | POA: Diagnosis not present

## 2016-11-13 DIAGNOSIS — E1142 Type 2 diabetes mellitus with diabetic polyneuropathy: Secondary | ICD-10-CM | POA: Diagnosis not present

## 2016-11-14 DIAGNOSIS — Z9181 History of falling: Secondary | ICD-10-CM | POA: Diagnosis not present

## 2016-11-14 DIAGNOSIS — M810 Age-related osteoporosis without current pathological fracture: Secondary | ICD-10-CM | POA: Diagnosis not present

## 2016-11-17 ENCOUNTER — Telehealth: Payer: Self-pay | Admitting: Family Medicine

## 2016-11-17 MED ORDER — DILTIAZEM HCL ER COATED BEADS 180 MG PO CP24: 180.0000 mg | ORAL_CAPSULE | Freq: Every day | ORAL | 0 refills | Status: DC

## 2016-11-17 NOTE — Telephone Encounter (Signed)
Please advise 

## 2016-11-17 NOTE — Telephone Encounter (Signed)
Patient reports that BP was 89/48 with HR of 65 yesterday morning.  She rechecked later that morning and was 155/45 HR 68.  He neighbor came over yesterday afternoon and checked BP with his cuff and BP was 137-62 HR 60.  We have recently stopped amlodipine and increase diltiazem from 180mg  to 240mg  . Patient states she has had less energy since this change.   Recommended she go back to diltiazem 180mg  qd.  She is to continue to check BP daily.  To call office if DBP continues to be less than 60 or SBP is less than 100 or over 140.

## 2016-11-28 DIAGNOSIS — M810 Age-related osteoporosis without current pathological fracture: Secondary | ICD-10-CM | POA: Diagnosis not present

## 2016-11-28 DIAGNOSIS — Z9181 History of falling: Secondary | ICD-10-CM | POA: Diagnosis not present

## 2016-12-02 DIAGNOSIS — M810 Age-related osteoporosis without current pathological fracture: Secondary | ICD-10-CM | POA: Diagnosis not present

## 2016-12-02 DIAGNOSIS — Z9181 History of falling: Secondary | ICD-10-CM | POA: Diagnosis not present

## 2016-12-09 DIAGNOSIS — M810 Age-related osteoporosis without current pathological fracture: Secondary | ICD-10-CM | POA: Diagnosis not present

## 2016-12-09 DIAGNOSIS — Z9181 History of falling: Secondary | ICD-10-CM | POA: Diagnosis not present

## 2016-12-16 DIAGNOSIS — M810 Age-related osteoporosis without current pathological fracture: Secondary | ICD-10-CM | POA: Diagnosis not present

## 2016-12-16 DIAGNOSIS — Z9181 History of falling: Secondary | ICD-10-CM | POA: Diagnosis not present

## 2016-12-30 ENCOUNTER — Other Ambulatory Visit: Payer: Self-pay | Admitting: Family Medicine

## 2017-01-14 ENCOUNTER — Telehealth: Payer: Self-pay | Admitting: Family Medicine

## 2017-01-14 ENCOUNTER — Ambulatory Visit (INDEPENDENT_AMBULATORY_CARE_PROVIDER_SITE_OTHER): Payer: PPO | Admitting: Family Medicine

## 2017-01-14 ENCOUNTER — Encounter: Payer: Self-pay | Admitting: Family Medicine

## 2017-01-14 VITALS — BP 133/78 | HR 78 | Temp 97.7°F | Ht 59.0 in | Wt 160.0 lb

## 2017-01-14 DIAGNOSIS — E1121 Type 2 diabetes mellitus with diabetic nephropathy: Secondary | ICD-10-CM | POA: Diagnosis not present

## 2017-01-14 DIAGNOSIS — E782 Mixed hyperlipidemia: Secondary | ICD-10-CM

## 2017-01-14 DIAGNOSIS — I10 Essential (primary) hypertension: Secondary | ICD-10-CM | POA: Diagnosis not present

## 2017-01-14 DIAGNOSIS — Z794 Long term (current) use of insulin: Secondary | ICD-10-CM

## 2017-01-14 LAB — BAYER DCA HB A1C WAIVED: HB A1C: 6.6 % (ref <7.0)

## 2017-01-14 MED ORDER — NITROFURANTOIN MONOHYD MACRO 100 MG PO CAPS: 100.0000 mg | ORAL_CAPSULE | Freq: Every day | ORAL | 5 refills | Status: DC

## 2017-01-14 MED ORDER — DILTIAZEM HCL 120 MG PO TABS: 120.0000 mg | ORAL_TABLET | Freq: Two times a day (BID) | ORAL | 5 refills | Status: DC

## 2017-01-14 MED ORDER — INSULIN GLARGINE 100 UNIT/ML SOLOSTAR PEN: PEN_INJECTOR | SUBCUTANEOUS | 0 refills | Status: DC

## 2017-01-14 MED ORDER — GABAPENTIN 100 MG PO CAPS: 300.0000 mg | ORAL_CAPSULE | Freq: Two times a day (BID) | ORAL | 1 refills | Status: DC

## 2017-01-14 MED ORDER — INSULIN PEN NEEDLE 31G X 8 MM MISC: 6 refills | Status: DC

## 2017-01-14 MED ORDER — ROPINIROLE HCL 0.25 MG PO TABS: ORAL_TABLET | ORAL | 0 refills | Status: DC

## 2017-01-14 MED ORDER — OMEPRAZOLE 20 MG PO CPDR: DELAYED_RELEASE_CAPSULE | ORAL | 1 refills | Status: DC

## 2017-01-14 NOTE — Telephone Encounter (Signed)
Her insulin is lantus solostar 21mg  FYI

## 2017-01-14 NOTE — Telephone Encounter (Signed)
Patient was calling back with information from today's visit

## 2017-01-14 NOTE — Progress Notes (Signed)
Subjective:  Patient ID: [name],  female    DOB: 02-14-1926  Age: 83 y.o.    CC: Diabetes (pt here today for routine follow up of her chronic medical conditions)   HPI ISA HITZ presents for  follow-up of hypertension. Patient has no history of headache chest pain or shortness of breath or recent cough. Patient also denies symptoms of TIA such as numbness weakness lateralizing. Patient checks  blood pressure at home. Recent readings have been poor  shows multiple elevated readingsPatient denies side effects from medication. States taking it regularly.  Patient also  in for follow-up of elevated cholesterol. Doing well without complaints on current medication. Denies side effects of statin including myalgia and arthralgia and nausea. Also in today for liver function testing. Currently no chest pain, shortness of breath or other cardiovascular related symptoms noted.  Follow-up of diabetes. Patient does check blood sugar at home. Readings run between 100 and 200 Patient denies symptoms such as polyuria, polydipsia, excessive hunger, nausea No significant hypoglycemic spells noted. Medications reviewed. Pt reports taking them regularly. Pt. denies complication/adverse reaction today.    History Anavictoria has a past medical history of Arthritis; Cancer ([place]); Cataract; Diabetes mellitus without complication ([place]); Frequent UTI; Hyperlipidemia; Hypertension; Neck pain; Scoliosis; and Stroke ([place]).   She has a past surgical history that includes Appendectomy; Cholecystectomy; Tonsillectomy; Joint replacement; Eye surgery; Replacement total knee bilateral (Bilateral); and Breast lumpectomy (Left).   Her family history includes Alcohol abuse in her brother; Arthritis in her mother; Cancer in her brother and mother; Heart disease in her father.She reports that she has never smoked. She has never used smokeless tobacco. She reports that she does not drink alcohol or use drugs.  Current  Outpatient Prescriptions on File Prior to Visit  Medication Sig Dispense Refill  . Aspirin-Caffeine (BAYER BACK & BODY PAIN EX ST PO) Take 1 tablet by mouth daily as needed (pain).     . Calcium Carbonate-Vit D-Min (CALCIUM 1200 PO) Take 1 tablet by mouth every other day.     . Cholecalciferol (VITAMIN D3) 5000 units CAPS Take 1 capsule by mouth daily.    . Cyanocobalamin (VITAMIN B 12 PO) Take 1 tablet by mouth daily.    . Magnesium 250 MG TABS Take 2 tablets (500 mg total) by mouth 2 (two) times daily. 120 tablet 11  . meloxicam (MOBIC) 15 MG tablet Take 15 mg by mouth daily.     . metFORMIN (GLUCOPHAGE) 500 MG tablet Take 1 Tablet by mouth once daily with BREAKFAST 90 tablet 0  . ONE TOUCH ULTRA TEST test strip USE TO check blood sugars twice daily 100 each 12   No current facility-administered medications on file prior to visit.     ROS Review of Systems  Constitutional: Negative for activity change, appetite change and fever.  HENT: Negative for congestion, rhinorrhea and sore throat.   Eyes: Negative for visual disturbance.  Respiratory: Negative for cough and shortness of breath.   Cardiovascular: Negative for chest pain and palpitations.  Gastrointestinal: Negative for abdominal pain, diarrhea and nausea.  Genitourinary: Negative for dysuria.  Musculoskeletal: Negative for arthralgias and myalgias.    Objective:  BP 133/78   Pulse 78   Temp 97.7 F (36.5 C) (Oral)   Ht '4\' 11"'$  (1.499 m)   Wt 160 lb (72.6 kg)   BMI 32.32 kg/m   BP Readings from Last 3 Encounters:  01/14/17 133/78  11/04/16 126/68  10/22/16 136/80  Wt Readings from Last 3 Encounters:  01/14/17 160 lb (72.6 kg)  11/04/16 160 lb (72.6 kg)  10/22/16 159 lb (72.1 kg)     Physical Exam  Constitutional: She is oriented to person, place, and time. She appears well-developed and well-nourished. No distress.  HENT:  Head: Normocephalic and atraumatic.  Right Ear: External ear normal.  Left Ear:  External ear normal.  Nose: Nose normal.  Mouth/Throat: Oropharynx is clear and moist.  Eyes: Pupils are equal, round, and reactive to light. Conjunctivae and EOM are normal.  Neck: Normal range of motion. Neck supple. No thyromegaly present.  Cardiovascular: Normal rate, regular rhythm and normal heart sounds.   No murmur heard. Pulmonary/Chest: Effort normal and breath sounds normal. No respiratory distress. She has no wheezes. She has no rales.  Abdominal: Soft. Bowel sounds are normal. She exhibits no distension. There is no tenderness.  Lymphadenopathy:    She has no cervical adenopathy.  Neurological: She is alert and oriented to person, place, and time. She has normal reflexes.  Skin: Skin is warm and dry.  Psychiatric: She has a normal mood and affect. Her behavior is normal. Judgment and thought content normal.    Diabetic Foot Exam - Simple   Simple Foot Form Diabetic Foot exam was performed with the following findings:  Yes 01/14/2017  3:30 PM  Visual Inspection No deformities, no ulcerations, no other skin breakdown bilaterally:  Yes Sensation Testing Intact to touch and monofilament testing bilaterally:  Yes Pulse Check Posterior Tibialis and Dorsalis pulse intact bilaterally:  Yes Comments       Assessment & Plan:   [name] was seen today for diabetes.  Diagnoses and all orders for this visit:  Essential hypertension -     CBC with Differential/Platelet -     CMP14+EGFR  Mixed hyperlipidemia -     Lipid panel  Controlled type 2 diabetes mellitus with diabetic nephropathy, with long-term current use of insulin (HCC) -     Bayer DCA Hb A1c Waived  Other orders -     diltiazem (CARDIZEM) 120 MG tablet; Take 1 tablet (120 mg total) by mouth 2 (two) times daily. -     gabapentin (NEURONTIN) 100 MG capsule; Take 3 capsules (300 mg total) by mouth 2 (two) times daily. -     Insulin Pen Needle 31G X 8 MM MISC; Use once daily with lantus solostar -     Insulin  Glargine (LANTUS SOLOSTAR) 100 UNIT/ML Solostar Pen; Inject 23 to 50 units subcutaneously once daily at 10 PM as directed -     nitrofurantoin, macrocrystal-monohydrate, (MACROBID) 100 MG capsule; Take 1 capsule (100 mg total) by mouth at bedtime. -     omeprazole (PRILOSEC) 20 MG capsule; Take 1 Capsule by mouth 2 times a day BEFORE meals -     rOPINIRole (REQUIP) 0.25 MG tablet; take 1  Tablet by mouth 3 times daily for leg cramps   I have discontinued Ms. Wey's diltiazem. I have changed her LANTUS SOLOSTAR to Insulin Glargine. I have also changed her nitrofurantoin (macrocrystal-monohydrate). Additionally, I am having her start on diltiazem. Lastly, I am having her maintain her Aspirin-Caffeine (BAYER BACK & BODY PAIN EX ST PO), Calcium Carbonate-Vit D-Min (CALCIUM 1200 PO), Vitamin D3, meloxicam, Magnesium, ONE TOUCH ULTRA TEST, Cyanocobalamin (VITAMIN B 12 PO), metFORMIN, gabapentin, Insulin Pen Needle, omeprazole, and rOPINIRole.  Meds ordered this encounter  Medications  . diltiazem (CARDIZEM) 120 MG tablet    Sig: Take 1 tablet (120  mg total) by mouth 2 (two) times daily.    Dispense:  60 tablet    Refill:  5  . gabapentin (NEURONTIN) 100 MG capsule    Sig: Take 3 capsules (300 mg total) by mouth 2 (two) times daily.    Dispense:  540 capsule    Refill:  1    Please consider 90 day supplies to promote better adherence  . Insulin Pen Needle 31G X 8 MM MISC    Sig: Use once daily with lantus solostar    Dispense:  100 each    Refill:  6    DX: E11.42  . Insulin Glargine (LANTUS SOLOSTAR) 100 UNIT/ML Solostar Pen    Sig: Inject 23 to 50 units subcutaneously once daily at 10 PM as directed    Dispense:  15 mL    Refill:  0    This prescription was filled on 10/31/2016. Any refills authorized will be placed on file.  . nitrofurantoin, macrocrystal-monohydrate, (MACROBID) 100 MG capsule    Sig: Take 1 capsule (100 mg total) by mouth at bedtime.    Dispense:  30 capsule     Refill:  5  . omeprazole (PRILOSEC) 20 MG capsule    Sig: Take 1 Capsule by mouth 2 times a day BEFORE meals    Dispense:  180 capsule    Refill:  1    This prescription was filled on 09/10/2016. Any refills authorized will be placed on file.  Marland Kitchen rOPINIRole (REQUIP) 0.25 MG tablet    Sig: take 1  Tablet by mouth 3 times daily for leg cramps    Dispense:  90 tablet    Refill:  0    This prescription was filled on 10/31/2016. Any refills authorized will be placed on file.     Follow-up: Return in about 3 months (around 04/15/2017).  Claretta Fraise, M.D.

## 2017-01-15 ENCOUNTER — Other Ambulatory Visit: Payer: Self-pay | Admitting: Family Medicine

## 2017-01-15 LAB — CMP14+EGFR
ALT: 24 IU/L (ref 0–32)
AST: 22 IU/L (ref 0–40)
Albumin/Globulin Ratio: 1.6 (ref 1.2–2.2)
Albumin: 4.1 g/dL (ref 3.5–4.7)
Alkaline Phosphatase: 72 IU/L (ref 39–117)
BUN/Creatinine Ratio: 22 (ref 12–28)
BUN: 21 mg/dL (ref 8–27)
Bilirubin Total: <0.2 mg/dL (ref 0.0–1.2)
CALCIUM: 9.3 mg/dL (ref 8.7–10.3)
CHLORIDE: 99 mmol/L (ref 96–106)
CO2: 24 mmol/L (ref 20–29)
CREATININE: 0.94 mg/dL (ref 0.57–1.00)
GFR, EST AFRICAN AMERICAN: 62 mL/min/{1.73_m2} (ref >59)
GFR, EST NON AFRICAN AMERICAN: 54 mL/min/{1.73_m2} — AB (ref >59)
GLUCOSE: 149 mg/dL — AB (ref 65–99)
Globulin, Total: 2.5 g/dL (ref 1.5–4.5)
Potassium: 4 mmol/L (ref 3.5–5.2)
Sodium: 140 mmol/L (ref 134–144)
Total Protein: 6.6 g/dL (ref 6.0–8.5)

## 2017-01-15 LAB — CBC WITH DIFFERENTIAL/PLATELET
BASOS ABS: 0.1 10*3/uL (ref 0.0–0.2)
BASOS: 1 %
EOS (ABSOLUTE): 0.4 10*3/uL (ref 0.0–0.4)
Eos: 5 %
Hematocrit: 39.2 % (ref 34.0–46.6)
Hemoglobin: 12.4 g/dL (ref 11.1–15.9)
IMMATURE GRANS (ABS): 0 10*3/uL (ref 0.0–0.1)
IMMATURE GRANULOCYTES: 0 %
LYMPHS: 22 %
Lymphocytes Absolute: 1.8 10*3/uL (ref 0.7–3.1)
MCH: 25.3 pg — ABNORMAL LOW (ref 26.6–33.0)
MCHC: 31.6 g/dL (ref 31.5–35.7)
MCV: 80 fL (ref 79–97)
MONOCYTES: 8 %
Monocytes Absolute: 0.7 10*3/uL (ref 0.1–0.9)
NEUTROS PCT: 64 %
Neutrophils Absolute: 5.1 10*3/uL (ref 1.4–7.0)
PLATELETS: 263 10*3/uL (ref 150–379)
RBC: 4.91 x10E6/uL (ref 3.77–5.28)
RDW: 16.4 % — ABNORMAL HIGH (ref 12.3–15.4)
WBC: 8.1 10*3/uL (ref 3.4–10.8)

## 2017-01-15 LAB — LIPID PANEL
CHOL/HDL RATIO: 6.2 ratio — AB (ref 0.0–4.4)
Cholesterol, Total: 248 mg/dL — ABNORMAL HIGH (ref 100–199)
HDL: 40 mg/dL (ref >39)
LDL CALC: 164 mg/dL — AB (ref 0–99)
Triglycerides: 222 mg/dL — ABNORMAL HIGH (ref 0–149)
VLDL CHOLESTEROL CAL: 44 mg/dL — AB (ref 5–40)

## 2017-01-15 MED ORDER — INSULIN GLARGINE 100 UNIT/ML SOLOSTAR PEN: PEN_INJECTOR | SUBCUTANEOUS | 0 refills | Status: DC

## 2017-01-16 ENCOUNTER — Telehealth: Payer: Self-pay | Admitting: *Deleted

## 2017-01-16 NOTE — Telephone Encounter (Signed)
Patchy itchy ed rash on torso, legs Started yesterday morning No new products Was changed from dilt XR 180 to diltiazem 120mg  BID two days ago at office visit Has been on macrobid once a day for UTI ppx for months No other new medications or med changes BPs at home 114/45 this morning, didn't take diltiazem at all today 139/67 now HR 83 No h/o afib  rec stop diltiazem Check BP daily Call us with BPs next week If rash not improving needs to be seen

## 2017-01-16 NOTE — Telephone Encounter (Signed)
Patient called stating that diltiazem was increased to 120 mg.  She is also taking macrobid as a maintance.  Patient now has a red, itchy rash.

## 2017-01-16 NOTE — Telephone Encounter (Signed)
Pt. States that rash is patchy from abdomen to knees. No change in lotions, soap, dish detergent or laundry detergent.  Only change noticed is increase in diltiazem to 120mg  twice daily.  Bp reading this Am 114/ 45.  Now 139/67 83.  Per Dr. Evette Doffing patient is to go back to taking diltiazem to 180 mg once daily and to call back to the office in one week with BP readings.  Patient also informed if that rash does not get better with change in diltiazem to call this office back.  Patient has no dyspnea.

## 2017-01-19 ENCOUNTER — Encounter: Payer: Self-pay | Admitting: Pediatrics

## 2017-01-19 ENCOUNTER — Ambulatory Visit (INDEPENDENT_AMBULATORY_CARE_PROVIDER_SITE_OTHER): Payer: PPO | Admitting: Pediatrics

## 2017-01-19 ENCOUNTER — Telehealth: Payer: Self-pay | Admitting: Pediatrics

## 2017-01-19 VITALS — BP 160/82 | HR 78 | Temp 97.9°F | Ht 59.0 in | Wt 160.8 lb

## 2017-01-19 DIAGNOSIS — I10 Essential (primary) hypertension: Secondary | ICD-10-CM | POA: Diagnosis not present

## 2017-01-19 DIAGNOSIS — Z794 Long term (current) use of insulin: Secondary | ICD-10-CM | POA: Diagnosis not present

## 2017-01-19 DIAGNOSIS — T887XXA Unspecified adverse effect of drug or medicament, initial encounter: Secondary | ICD-10-CM

## 2017-01-19 DIAGNOSIS — E119 Type 2 diabetes mellitus without complications: Secondary | ICD-10-CM | POA: Diagnosis not present

## 2017-01-19 DIAGNOSIS — R21 Rash and other nonspecific skin eruption: Secondary | ICD-10-CM | POA: Diagnosis not present

## 2017-01-19 DIAGNOSIS — L298 Other pruritus: Secondary | ICD-10-CM

## 2017-01-19 DIAGNOSIS — T50905A Adverse effect of unspecified drugs, medicaments and biological substances, initial encounter: Secondary | ICD-10-CM

## 2017-01-19 MED ORDER — DILTIAZEM HCL ER 180 MG PO CP24: 180.0000 mg | ORAL_CAPSULE | Freq: Every day | ORAL | 1 refills | Status: DC

## 2017-01-19 MED ORDER — PREDNISONE 20 MG PO TABS: ORAL_TABLET | ORAL | 0 refills | Status: DC

## 2017-01-19 MED ORDER — TRIAMCINOLONE ACETONIDE 0.025 % EX OINT: 1.0000 "application " | TOPICAL_OINTMENT | Freq: Two times a day (BID) | CUTANEOUS | 0 refills | Status: DC | PRN

## 2017-01-19 NOTE — Telephone Encounter (Signed)
Pt notified of appt 

## 2017-01-19 NOTE — Patient Instructions (Signed)
Triamcinolone cream twice a day as needed for itching Go back to diltiazem 180mg  extended release Keep taking blood pressures and blood sugars at home Take prednisone 40mg  today, 20mg  tomorrow if still itching Blood sugars will be elevated for a few days If numbers over 300 let us know  Can take benadryl 25mg  every 6 hrs as needed for itching or use cold compresses for itching

## 2017-01-19 NOTE — Progress Notes (Signed)
  Subjective:   Patient ID: [name], female    DOB: 1925-05-22, 83 y.o.   MRN: [id number] CC: Rash  HPI: [name] is a 83 y.o. female presenting for Rash  Seen in clinic 5 days ago BP med switched from diltiazem 180mg  long acting to short acting diltiazem 120mg  BID She took her first dose of diltiazem 120mg  short acting around noon the next day Within a couple of hours started itching on her arms, abd, b/l upper legs. Noticed a red rash where she was itching No bumps Has been scratching at her hands, arms  Benadryl helps some but makes her sleepy  No other new medications, no bug bites, no new exposures, no other family members itching  Has been checking BPs at home 130s-150s  No CP, no SOB, no trouble breathing  Relevant past medical, surgical, family and social history reviewed. Allergies and medications reviewed and updated. History  Smoking Status  . Never Smoker  Smokeless Tobacco  . Never Used   ROS: Per HPI   Objective:    BP (!) 160/82   Pulse 78   Temp 97.9 F (36.6 C) (Oral)   Ht 4\' 11"  (1.499 m)   Wt 160 lb 12.8 oz (72.9 kg)   BMI 32.48 kg/m   Wt Readings from Last 3 Encounters:  01/19/17 160 lb 12.8 oz (72.9 kg)  01/14/17 160 lb (72.6 kg)  11/04/16 160 lb (72.6 kg)    Gen: NAD, alert, cooperative with exam, NCAT EYES: EOMI, no conjunctival injection, or no icterus ENT:   OP without erythema CV: NRRR, normal S1/S2 Resp: CTABL, no wheezes, normal WOB Abd: +BS, soft, NTND. no guarding or organomegaly Ext: No edema, warm Neuro: Alert and oriented Skin: red skin b/l inner thighs, patchy red-pink areas with some excoriations b/l forearms   Assessment & Plan:  [name] was seen today for rash.  Diagnoses and all orders for this visit:  Essential hypertension Restart dilt 180mg  24h, cont to monitor BPs at home, bring to next clinic visit -     diltiazem (DILT-XR) 180 MG 24 hr capsule; Take 1 capsule (180 mg total) by mouth daily.  Adverse  effect of drug, initial encounter -     predniSONE (DELTASONE) 20 MG tablet; Take two pills first day, 1 pill next day if still itching  Rash and nonspecific skin eruption -     triamcinolone (KENALOG) 0.025 % ointment; Apply 1 application topically 2 (two) times daily as needed.  Itching due to drug Only new known exposure short acting diltiazem, possible that this is the cause of reaction Start below If not improving rtc -     triamcinolone (KENALOG) 0.025 % ointment; Apply 1 application topically 2 (two) times daily as needed. -     predniSONE (DELTASONE) 20 MG tablet; Take two pills first day, 1 pill next day if still itching  Type 2 diabetes mellitus without complication, with long-term current use of insulin (HCC) Morning BGLs low 100s On long acting insulin alone Watch BGLs at home, if remain elevated with prednisone as above let us know  Follow up plan: Return in about 4 weeks (around 02/16/2017). Assunta Found, MD [place]

## 2017-01-22 ENCOUNTER — Telehealth: Payer: Self-pay | Admitting: Pediatrics

## 2017-01-23 NOTE — Telephone Encounter (Signed)
[place]

## 2017-01-28 ENCOUNTER — Other Ambulatory Visit: Payer: Self-pay | Admitting: Family Medicine

## 2017-02-03 DIAGNOSIS — L84 Corns and callosities: Secondary | ICD-10-CM | POA: Diagnosis not present

## 2017-02-03 DIAGNOSIS — M79676 Pain in unspecified toe(s): Secondary | ICD-10-CM | POA: Diagnosis not present

## 2017-02-03 DIAGNOSIS — B351 Tinea unguium: Secondary | ICD-10-CM | POA: Diagnosis not present

## 2017-02-03 DIAGNOSIS — E1142 Type 2 diabetes mellitus with diabetic polyneuropathy: Secondary | ICD-10-CM | POA: Diagnosis not present

## 2017-02-20 ENCOUNTER — Ambulatory Visit (INDEPENDENT_AMBULATORY_CARE_PROVIDER_SITE_OTHER): Payer: PPO | Admitting: *Deleted

## 2017-02-20 DIAGNOSIS — Z23 Encounter for immunization: Secondary | ICD-10-CM | POA: Diagnosis not present

## 2017-03-09 ENCOUNTER — Ambulatory Visit (INDEPENDENT_AMBULATORY_CARE_PROVIDER_SITE_OTHER): Payer: PPO

## 2017-03-09 ENCOUNTER — Ambulatory Visit (INDEPENDENT_AMBULATORY_CARE_PROVIDER_SITE_OTHER): Payer: PPO | Admitting: Family Medicine

## 2017-03-09 ENCOUNTER — Encounter: Payer: Self-pay | Admitting: Family Medicine

## 2017-03-09 ENCOUNTER — Telehealth: Payer: Self-pay | Admitting: Family Medicine

## 2017-03-09 VITALS — BP 151/72 | HR 75 | Temp 97.7°F | Ht 59.0 in | Wt 159.0 lb

## 2017-03-09 DIAGNOSIS — W19XXXA Unspecified fall, initial encounter: Secondary | ICD-10-CM

## 2017-03-09 DIAGNOSIS — M8440XA Pathological fracture, unspecified site, initial encounter for fracture: Secondary | ICD-10-CM

## 2017-03-09 DIAGNOSIS — M79641 Pain in right hand: Secondary | ICD-10-CM | POA: Diagnosis not present

## 2017-03-09 DIAGNOSIS — S62614A Displaced fracture of proximal phalanx of right ring finger, initial encounter for closed fracture: Secondary | ICD-10-CM | POA: Diagnosis not present

## 2017-03-09 DIAGNOSIS — M79671 Pain in right foot: Secondary | ICD-10-CM

## 2017-03-09 DIAGNOSIS — R0789 Other chest pain: Secondary | ICD-10-CM | POA: Diagnosis not present

## 2017-03-09 DIAGNOSIS — S62619A Displaced fracture of proximal phalanx of unspecified finger, initial encounter for closed fracture: Secondary | ICD-10-CM

## 2017-03-09 DIAGNOSIS — R079 Chest pain, unspecified: Secondary | ICD-10-CM | POA: Diagnosis not present

## 2017-03-09 MED ORDER — HYDROCODONE-ACETAMINOPHEN 5-325 MG PO TABS: 1.0000 | ORAL_TABLET | Freq: Four times a day (QID) | ORAL | 0 refills | Status: DC | PRN

## 2017-03-09 NOTE — Progress Notes (Addendum)
Subjective: SW:FUXN PCP: Claretta Fraise, MD HPI:[name] is a 83 y.o. female presenting to clinic today for:  1. Fall Patient reports that she fell last Thursday.  She notes that she was walking down some steps when her hand slipped and she lost her balance.  She reports pain in her right hand, right foot and the right side of her ribs.  She notes that she did have discoloration of the right hand immediately after the fall.  She also had foot swelling that has since improved.  She notes pain with deep breathing and with rolling over in bed on the right side.  No nausea, vomiting, loss of consciousness, numbness, tingling, urinary retention or fecal incontinence.  She notes a history of 13+ falls in 2012 but has not had falls recently.  Allergies  Allergen Reactions  . Diltiazem Hcl     Red itchy rash started a few hours after taking short acting 120mg  dilt tabs  . Ace Inhibitors Cough   Past Medical History:  Diagnosis Date  . Arthritis   . Cancer ([place])    breast  . Cataract   . Diabetes mellitus without complication ([place])   . Frequent UTI   . Hyperlipidemia   . Hypertension   . Neck pain   . Scoliosis   . Stroke [facility])    Family History  Problem Relation Age of Onset  . Cancer Mother        lung  . Arthritis Mother   . Heart disease Father        endocarditis  . Cancer Brother        lung, throat  . Alcohol abuse Brother     Current Outpatient Medications:  .  Aspirin-Caffeine (BAYER BACK & BODY PAIN EX ST PO), Take 1 tablet by mouth daily as needed (pain). , Disp: , Rfl:  .  Calcium Carbonate-Vit D-Min (CALCIUM 1200 PO), Take 1 tablet by mouth every other day. , Disp: , Rfl:  .  Cholecalciferol (VITAMIN D3) 5000 units CAPS, Take 1 capsule by mouth daily., Disp: , Rfl:  .  Cyanocobalamin (VITAMIN B 12 PO), Take 1 tablet by mouth daily., Disp: , Rfl:  .  diltiazem (DILT-XR) 180 MG 24 hr capsule, Take 1 capsule (180 mg total) by mouth daily., Disp: 90 capsule,  Rfl: 1 .  gabapentin (NEURONTIN) 100 MG capsule, Take 3 capsules (300 mg total) by mouth 2 (two) times daily., Disp: 540 capsule, Rfl: 1 .  Insulin Glargine (LANTUS SOLOSTAR) 100 UNIT/ML Solostar Pen, Inject 21 units subcutaneously once daily at 10 PM as directed, Disp: 15 mL, Rfl: 0 .  Insulin Pen Needle 31G X 8 MM MISC, Use once daily with lantus solostar, Disp: 100 each, Rfl: 6 .  Magnesium 250 MG TABS, Take 2 tablets (500 mg total) by mouth 2 (two) times daily., Disp: 120 tablet, Rfl: 11 .  meloxicam (MOBIC) 15 MG tablet, Take 15 mg by mouth daily. , Disp: , Rfl:  .  metFORMIN (GLUCOPHAGE) 500 MG tablet, Take 1 Tablet by mouth once daily with BREAKFAST, Disp: 90 tablet, Rfl: 0 .  nitrofurantoin, macrocrystal-monohydrate, (MACROBID) 100 MG capsule, Take 1 capsule (100 mg total) by mouth at bedtime., Disp: 30 capsule, Rfl: 5 .  omeprazole (PRILOSEC) 20 MG capsule, Take 1 Capsule by mouth 2 times a day BEFORE meals, Disp: 180 capsule, Rfl: 1 .  ONE TOUCH ULTRA TEST test strip, USE TO check blood sugars twice daily, Disp: 100 each, Rfl: 12 .  predniSONE (DELTASONE) 20 MG tablet, Take two pills first day, 1 pill next day if still itching, Disp: 3 tablet, Rfl: 0 .  rOPINIRole (REQUIP) 0.25 MG tablet, take 1  Tablet by mouth 3 times daily for leg cramps, Disp: 90 tablet, Rfl: 0 .  triamcinolone (KENALOG) 0.025 % ointment, Apply 1 application topically 2 (two) times daily as needed., Disp: 60 g, Rfl: 0 .  HYDROcodone-acetaminophen (NORCO) 5-325 MG tablet, Take 1 tablet every 6 (six) hours as needed by mouth for moderate pain., Disp: 20 tablet, Rfl: 0  Social Hx: non smoker.  Health Maintenance: Flu shot  ROS: Per HPI  Objective: Office vital signs reviewed. BP (!) 151/72   Pulse 75   Temp 97.7 F (36.5 C) (Oral)   Ht 4\' 11"  (1.499 m)   Wt 159 lb (72.1 kg)   BMI 32.11 kg/m   Physical Examination:  General: Awake, alert, well nourished, well appearing female, No acute distress HEENT:  Normal    Eyes: PERRLA, extraocular membranes intact, sclera white    Throat: moist mucus membranes, Cardio: regular rate, +2 DP Pulm:  normal work of breathing on room air MSK: ambulates w/ cane.  Gait somewhat antalgic right hand with extensive bruising and swelling of several joints along the second and third digits.  She is unable to make a closed fist with the right hand.  She has pain with active range of motion in the right side of her ribs from ribs 5 through 10.  No tenderness to palpation in this area.  No midline tenderness to palpation of the entire spine.  She has market kyphosis of thoracic spine.  No tenderness to the low back or hips.  Her foot with a notable hallux valgus but no significant swelling appreciated. Neuro: Alert and oriented x3, follows all commands, has light touch sensation in upper extremities and lower extremities grossly.  No focal neurologic deficits.  Dg Ribs Unilateral W/chest Right  Result Date: 03/09/2017 CLINICAL DATA:  Recent fall with right-sided chest pain, initial encounter EXAM: RIGHT RIBS AND CHEST - 3+ VIEW COMPARISON:  09/24/2015 FINDINGS: Cardiac shadow is stable. Aortic calcifications are again seen. Postsurgical changes on the left are noted. The lungs are well-aerated without focal infiltrate or sizable effusion. No definitive rib fractures are identified. Degenerative changes of the shoulder joints and thoracic spine are noted. IMPRESSION: No acute rib fracture identified. Electronically Signed   By: Inez Catalina M.D.   On: 03/09/2017 10:48   Dg Hand Complete Right  Addendum Date: 03/09/2017   ADDENDUM REPORT: 03/09/2017 10:53 ADDENDUM: Further evaluation shows a minimally displaced fracture at the base of the fourth proximal phalanx. This appears to extend into the articular surface. Electronically Signed   By: Inez Catalina M.D.   On: 03/09/2017 10:53   Result Date: 03/09/2017 CLINICAL DATA:  Recent fall with right hand pain, initial encounter  EXAM: RIGHT HAND - COMPLETE 3+ VIEW COMPARISON:  None. FINDINGS: Mild osteopenia is noted. Degenerative changes are noted in the interphalangeal joints as well as at the first [facility] joint. Deformity of the distal radius is noted consistent with prior healed fracture. No acute fracture or dislocation is noted. No gross soft tissue abnormality is seen. IMPRESSION: Degenerative and prior posttraumatic changes. No acute fracture is noted at this time. Electronically Signed: By: Inez Catalina M.D. On: 03/09/2017 10:36   Dg Foot Complete Right  Result Date: 03/09/2017 CLINICAL DATA:  83 year old female status post fall four days ago with pain. EXAM:  RIGHT FOOT COMPLETE - 3+ VIEW COMPARISON:  None. FINDINGS: Hallux valgus and metatarsus primus varus. Healed fracture of the right second metatarsal. No acute metatarsal or phalanx fracture. First MTP joint degeneration. Intermittent DIP joint degeneration. Tarsal bones appear intact and normally aligned. Calcaneus appears intact with mild degenerative spurring. Grossly intact distal tibia and fibula. IMPRESSION: 1. No acute fracture or dislocation identified about the right foot. 2. Hallux valgus, chronic fracture of the right second metatarsal, and intermittent distal joint space osteoarthritis. Electronically Signed   By: Genevie Ann M.D.   On: 03/09/2017 10:34    Assessment/ Plan: 83 y.o. female   1. Fall, initial encounter Given age and physical exam, x-rays of the ribs, right hand and right foot were obtained today.  Degenerative changes appreciated throughout.  No acute fractures were appreciated.  However, chronic fracture of the second digit of the right foot was seen.  I offered patient a Cam walker but she declined this.  I also placed a referral to orthopedic surgery for further evaluation.  Patient is somewhat reluctant to see an orthopedic surgeon but is amenable to seeing one if symptoms do not improve.  She was prescribed Norco 5 mg to use every 6 hours as  needed severe pain.  I cautioned sedation and increased risk for fall.  Patient uses stool softener if needing the medication.  I did recommend icing, rest. The [place] Narcotic Database has been reviewed.  There were no red flags.  Strict return precautions and reasons for emergent evaluation in the emergency department review with patient.  They voiced understanding and will follow-up as needed. - DG Ribs Unilateral W/Chest Right; Future - DG Hand Complete Right; Future - DG Foot Complete Right; Future - Ambulatory referral to Orthopedic Surgery  2. Chronic fracture Of right second digit appreciated on x-ray today. - Ambulatory referral to Orthopedic Surgery  3. Fracture of proximal phalanx of right hand, closed, initial encounter Patient on personal review.  Radiologist further reviewed imaging study and supported this finding.  A closed, minimally displaced fracture was appreciated at the base of the right fourth phalanx.  This does appear to involve the joint space.  Urgent referral to orthopedic surgery has been placed.    Orders Placed This Encounter  Procedures  . DG Ribs Unilateral W/Chest Right    Standing Status:   Future    Number of Occurrences:   1    Standing Expiration Date:   05/09/2018    Order Specific Question:   Reason for Exam (SYMPTOM  OR DIAGNOSIS REQUIRED)    Answer:   fall    Order Specific Question:   Preferred imaging location?    Answer:   Internal  . DG Hand Complete Right    Standing Status:   Future    Number of Occurrences:   1    Standing Expiration Date:   05/09/2018    Order Specific Question:   Reason for Exam (SYMPTOM  OR DIAGNOSIS REQUIRED)    Answer:   fall    Order Specific Question:   Preferred imaging location?    Answer:   Internal    Order Specific Question:   Radiology Contrast Protocol - do NOT remove file path    Answer:   \\[email or web address]  . DG Foot Complete Right    Standing Status:   Future     Number of Occurrences:   1    Standing Expiration Date:   05/09/2018    Order Specific Question:  Reason for Exam (SYMPTOM  OR DIAGNOSIS REQUIRED)    Answer:   fall    Order Specific Question:   Preferred imaging location?    Answer:   Internal    Order Specific Question:   Radiology Contrast Protocol - do NOT remove file path    Answer:   \\[email or web address]  . Ambulatory referral to Orthopedic Surgery    Referral Priority:   Routine    Referral Type:   Surgical    Referral Reason:   Specialty Services Required    Requested Specialty:   Orthopedic Surgery    Number of Visits Requested:   1   Meds ordered this encounter  Medications  . HYDROcodone-acetaminophen (NORCO) 5-325 MG tablet    Sig: Take 1 tablet every 6 (six) hours as needed by mouth for moderate pain.    Dispense:  20 tablet    Refill:  [place], DO [place] [phone]

## 2017-03-09 NOTE — Patient Instructions (Addendum)
I value your feedback and appreciate you entrusting [place] with your care.  If you get a survey, I would appreciate your taking the time to let us know what your experience was like.  I have placed a referral to orthopedics to look at your chronic foot fracture.  The radiologist did not call the spot on your hand a true fracture; however, I would like them to look closer at this as well.  For now, continue icing affected areas.  I have given you a small amount of pain medication to use if needed.  As we discussed, this can cause drowsiness and increase your risk of falls so please use this cautiously.

## 2017-03-09 NOTE — Telephone Encounter (Signed)
I attempted to reach patient but unfortunately was sent to voicemail.  I did leave her voicemail stating that she should call our office back for updates on her imaging studies.    Radiologist read reviewed hand x-ray, which did in fact reveal an acute fracture of the base of the fourth phalanx on the right-hand side.  Because this involves the joint space, I do recommend that patient seek evaluation with orthopedics.  The referral has been placed.  If she returns my call, please inform her of these results and advise her to go to the orthopedics appointment as scheduled.  Valla Pacey M. Lajuana Ripple, [facility]

## 2017-03-10 ENCOUNTER — Telehealth: Payer: Self-pay | Admitting: Family Medicine

## 2017-03-10 DIAGNOSIS — S638X1A Sprain of other part of right wrist and hand, initial encounter: Secondary | ICD-10-CM | POA: Diagnosis not present

## 2017-03-10 DIAGNOSIS — S93431A Sprain of tibiofibular ligament of right ankle, initial encounter: Secondary | ICD-10-CM | POA: Diagnosis not present

## 2017-03-10 DIAGNOSIS — M25511 Pain in right shoulder: Secondary | ICD-10-CM | POA: Diagnosis not present

## 2017-03-10 NOTE — Telephone Encounter (Signed)
Pt scheduled with Dr. Gladstone Lighter for this afternoon at 1:45

## 2017-03-10 NOTE — Telephone Encounter (Signed)
Pt aware of x-ray results and has an appointment with Dr. Gladstone Lighter at [facility] 03/10/17

## 2017-03-12 ENCOUNTER — Ambulatory Visit (INDEPENDENT_AMBULATORY_CARE_PROVIDER_SITE_OTHER): Payer: PPO | Admitting: Orthopaedic Surgery

## 2017-03-23 DIAGNOSIS — Z029 Encounter for administrative examinations, unspecified: Secondary | ICD-10-CM

## 2017-03-30 ENCOUNTER — Other Ambulatory Visit: Payer: Self-pay | Admitting: *Deleted

## 2017-03-30 DIAGNOSIS — I10 Essential (primary) hypertension: Secondary | ICD-10-CM

## 2017-03-30 MED ORDER — METFORMIN HCL 500 MG PO TABS: ORAL_TABLET | ORAL | 1 refills | Status: DC

## 2017-03-30 MED ORDER — DILTIAZEM HCL ER 180 MG PO CP24: 180.0000 mg | ORAL_CAPSULE | Freq: Every day | ORAL | 1 refills | Status: DC

## 2017-04-20 ENCOUNTER — Encounter: Payer: Self-pay | Admitting: Family Medicine

## 2017-04-20 ENCOUNTER — Ambulatory Visit: Payer: PPO | Admitting: Family Medicine

## 2017-04-20 VITALS — BP 135/71 | HR 75 | Temp 97.5°F | Ht 59.0 in | Wt 162.0 lb

## 2017-04-20 DIAGNOSIS — E782 Mixed hyperlipidemia: Secondary | ICD-10-CM

## 2017-04-20 DIAGNOSIS — Z9181 History of falling: Secondary | ICD-10-CM

## 2017-04-20 DIAGNOSIS — I10 Essential (primary) hypertension: Secondary | ICD-10-CM | POA: Diagnosis not present

## 2017-04-20 DIAGNOSIS — E1142 Type 2 diabetes mellitus with diabetic polyneuropathy: Secondary | ICD-10-CM | POA: Diagnosis not present

## 2017-04-20 NOTE — Progress Notes (Signed)
Subjective:  Patient ID: [name],  female    DOB: April 15, 1926  Age: 83 y.o.    CC: Hypertension (pt here today for routine follow up of her chronic medical conditions)   HPI [name] presents for  follow-up of hypertension. Patient has no history of headache chest pain or shortness of breath or recent cough. Patient also denies symptoms of TIA such as numbness weakness lateralizing. Patient checks  blood pressure at home. Recent readings have been adequate. Most are good, but went up on two occasions. Once after she fell and broke a finger. She also fell yesterday.  Patient denies side effects from medication. States taking it regularly.  Patient also  in for follow-up of elevated cholesterol. Doing well without complaints on current medication. Denies side effects of statin including myalgia and arthralgia and nausea. Also in today for liver function testing. Currently no chest pain, shortness of breath or other cardiovascular related symptoms noted.  Follow-up of diabetes. Patient does check blood sugar at home. Readings run between 90 and 200. Most in the low 100s. Log attached, reviewed with pt.  Patient denies symptoms such as polyuria, polydipsia, excessive hunger, nausea No significant hypoglycemic spells noted. Medications reviewed. Pt reports taking them regularly. Pt. denies complication/adverse reaction today.    History [name] has a past medical history of Arthritis, Cancer ([place]), Cataract, Diabetes mellitus without complication ([place]), Frequent UTI, Hyperlipidemia, Hypertension, Neck pain, Scoliosis, and Stroke ([place]).   She has a past surgical history that includes Appendectomy; Cholecystectomy; Tonsillectomy; Joint replacement; Eye surgery; Replacement total knee bilateral (Bilateral); and Breast lumpectomy (Left).   Her family history includes Alcohol abuse in her brother; Arthritis in her mother; Cancer in her brother and mother; Heart disease in her father.She  reports that  has never smoked. she has never used smokeless tobacco. She reports that she does not drink alcohol or use drugs.  Current Outpatient Medications on File Prior to Visit  Medication Sig Dispense Refill  . Aspirin-Caffeine (BAYER BACK & BODY PAIN EX ST PO) Take 1 tablet by mouth daily as needed (pain).     . Calcium Carbonate-Vit D-Min (CALCIUM 1200 PO) Take 1 tablet by mouth every other day.     . Cholecalciferol (VITAMIN D3) 5000 units CAPS Take 1 capsule by mouth daily.    . Cyanocobalamin (VITAMIN B 12 PO) Take 1 tablet by mouth daily.    Marland Kitchen diltiazem (DILT-XR) 180 MG 24 hr capsule Take 1 capsule (180 mg total) by mouth daily. 90 capsule 1  . gabapentin (NEURONTIN) 100 MG capsule Take 3 capsules (300 mg total) by mouth 2 (two) times daily. 540 capsule 1  . HYDROcodone-acetaminophen (NORCO) 5-325 MG tablet Take 1 tablet every 6 (six) hours as needed by mouth for moderate pain. 20 tablet 0  . Insulin Glargine (LANTUS SOLOSTAR) 100 UNIT/ML Solostar Pen Inject 21 units subcutaneously once daily at 10 PM as directed 15 mL 0  . Insulin Pen Needle 31G X 8 MM MISC Use once daily with lantus solostar 100 each 6  . Magnesium 250 MG TABS Take 2 tablets (500 mg total) by mouth 2 (two) times daily. 120 tablet 11  . meloxicam (MOBIC) 15 MG tablet Take 15 mg by mouth daily.     . metFORMIN (GLUCOPHAGE) 500 MG tablet Take 1 Tablet by mouth once daily with BREAKFAST 90 tablet 1  . nitrofurantoin, macrocrystal-monohydrate, (MACROBID) 100 MG capsule Take 1 capsule (100 mg total) by mouth at bedtime. 30 capsule  5  . omeprazole (PRILOSEC) 20 MG capsule Take 1 Capsule by mouth 2 times a day BEFORE meals 180 capsule 1  . ONE TOUCH ULTRA TEST test strip USE TO check blood sugars twice daily 100 each 12  . predniSONE (DELTASONE) 20 MG tablet Take two pills first day, 1 pill next day if still itching 3 tablet 0  . rOPINIRole (REQUIP) 0.25 MG tablet take 1  Tablet by mouth 3 times daily for leg cramps 90  tablet 0  . triamcinolone (KENALOG) 0.025 % ointment Apply 1 application topically 2 (two) times daily as needed. 60 g 0   No current facility-administered medications on file prior to visit.     ROS Review of Systems  Objective:  BP 135/71   Pulse 75   Temp (!) 97.5 F (36.4 C) (Oral)   Ht '4\' 11"'$  (1.499 m)   Wt 162 lb (73.5 kg)   BMI 32.72 kg/m   BP Readings from Last 3 Encounters:  04/20/17 135/71  03/09/17 (!) 151/72  01/19/17 (!) 160/82    Wt Readings from Last 3 Encounters:  04/20/17 162 lb (73.5 kg)  03/09/17 159 lb (72.1 kg)  01/19/17 160 lb 12.8 oz (72.9 kg)     Physical Exam  Diabetic Foot Exam - Simple   No data filed        Assessment & Plan:   [name] was seen today for hypertension.  Diagnoses and all orders for this visit:  Essential hypertension -     CMP14+EGFR -     CBC with Differential/Platelet  Diabetic polyneuropathy associated with type 2 diabetes mellitus (HCC) -     Microalbumin / creatinine urine ratio -     Urine Microscopic -     Bayer DCA Hb A1c Waived  Mixed hyperlipidemia  At high risk for falls   I am having [name] maintain her Aspirin-Caffeine (BAYER BACK & BODY PAIN EX ST PO), Calcium Carbonate-Vit D-Min (CALCIUM 1200 PO), Vitamin D3, meloxicam, Magnesium, ONE TOUCH ULTRA TEST, Cyanocobalamin (VITAMIN B 12 PO), gabapentin, Insulin Pen Needle, nitrofurantoin (macrocrystal-monohydrate), omeprazole, rOPINIRole, Insulin Glargine, triamcinolone, predniSONE, HYDROcodone-acetaminophen, diltiazem, and metFORMIN.  Fall risk discussed.  Various adaptations of her home and lifestyle were discussed.  Specifically consideration to independent living and assisted living facilities were discussed.  She says yesterday she fell because she was trying to carry groceries.  She has to carry them across gravel driveway.  So she cannot use a cart.   Follow-up: Return in about 3 months (around 07/19/2017), or if symptoms worsen or fail  to improve.  Claretta Fraise, M.D.

## 2017-04-21 ENCOUNTER — Other Ambulatory Visit: Payer: PPO

## 2017-04-21 ENCOUNTER — Telehealth: Payer: Self-pay | Admitting: Family Medicine

## 2017-04-21 DIAGNOSIS — E1142 Type 2 diabetes mellitus with diabetic polyneuropathy: Secondary | ICD-10-CM | POA: Diagnosis not present

## 2017-04-21 DIAGNOSIS — I10 Essential (primary) hypertension: Secondary | ICD-10-CM | POA: Diagnosis not present

## 2017-04-21 LAB — URINALYSIS, MICROSCOPIC ONLY
Bacteria, UA: NONE SEEN
Epithelial Cells (non renal): NONE SEEN /hpf (ref 0–10)
RBC MICROSCOPIC, UA: NONE SEEN /HPF (ref 0–?)
Renal Epithel, UA: NONE SEEN /hpf
WBC UA: NONE SEEN /HPF (ref 0–?)

## 2017-04-21 LAB — BAYER DCA HB A1C WAIVED: HB A1C (BAYER DCA - WAIVED): 6.2 % (ref <7.0)

## 2017-04-21 NOTE — Telephone Encounter (Signed)
Spoke with pt

## 2017-04-22 LAB — CBC WITH DIFFERENTIAL/PLATELET
BASOS ABS: 0.1 10*3/uL (ref 0.0–0.2)
Basos: 1 %
EOS (ABSOLUTE): 0.7 10*3/uL — AB (ref 0.0–0.4)
Eos: 9 %
Hematocrit: 39.7 % (ref 34.0–46.6)
Hemoglobin: 11.9 g/dL (ref 11.1–15.9)
Immature Grans (Abs): 0 10*3/uL (ref 0.0–0.1)
Immature Granulocytes: 0 %
LYMPHS ABS: 1.4 10*3/uL (ref 0.7–3.1)
LYMPHS: 18 %
MCH: 24.4 pg — AB (ref 26.6–33.0)
MCHC: 30 g/dL — AB (ref 31.5–35.7)
MCV: 82 fL (ref 79–97)
Monocytes Absolute: 0.7 10*3/uL (ref 0.1–0.9)
Monocytes: 8 %
NEUTROS ABS: 5.1 10*3/uL (ref 1.4–7.0)
Neutrophils: 64 %
PLATELETS: 288 10*3/uL (ref 150–379)
RBC: 4.87 x10E6/uL (ref 3.77–5.28)
RDW: 16.6 % — ABNORMAL HIGH (ref 12.3–15.4)
WBC: 8 10*3/uL (ref 3.4–10.8)

## 2017-04-22 LAB — CMP14+EGFR
A/G RATIO: 2 (ref 1.2–2.2)
ALK PHOS: 78 IU/L (ref 39–117)
ALT: 30 IU/L (ref 0–32)
AST: 25 IU/L (ref 0–40)
Albumin: 4.2 g/dL (ref 3.5–4.7)
BILIRUBIN TOTAL: 0.2 mg/dL (ref 0.0–1.2)
BUN/Creatinine Ratio: 25 (ref 12–28)
BUN: 21 mg/dL (ref 8–27)
CHLORIDE: 101 mmol/L (ref 96–106)
CO2: 22 mmol/L (ref 20–29)
Calcium: 9.2 mg/dL (ref 8.7–10.3)
Creatinine, Ser: 0.84 mg/dL (ref 0.57–1.00)
GFR calc non Af Amer: 62 mL/min/{1.73_m2} (ref >59)
GFR, EST AFRICAN AMERICAN: 71 mL/min/{1.73_m2} (ref >59)
GLUCOSE: 166 mg/dL — AB (ref 65–99)
Globulin, Total: 2.1 g/dL (ref 1.5–4.5)
POTASSIUM: 4.2 mmol/L (ref 3.5–5.2)
Sodium: 141 mmol/L (ref 134–144)
TOTAL PROTEIN: 6.3 g/dL (ref 6.0–8.5)

## 2017-04-22 LAB — MICROALBUMIN / CREATININE URINE RATIO
CREATININE, UR: 34.4 mg/dL
Microalb/Creat Ratio: 33.4 mg/g creat — ABNORMAL HIGH (ref 0.0–30.0)
Microalbumin, Urine: 11.5 ug/mL

## 2017-05-18 DIAGNOSIS — H40033 Anatomical narrow angle, bilateral: Secondary | ICD-10-CM | POA: Diagnosis not present

## 2017-05-18 DIAGNOSIS — H2513 Age-related nuclear cataract, bilateral: Secondary | ICD-10-CM | POA: Diagnosis not present

## 2017-05-20 ENCOUNTER — Encounter: Payer: Self-pay | Admitting: Family Medicine

## 2017-05-20 ENCOUNTER — Ambulatory Visit (INDEPENDENT_AMBULATORY_CARE_PROVIDER_SITE_OTHER): Payer: PPO | Admitting: Family Medicine

## 2017-05-20 VITALS — BP 130/65 | HR 69 | Temp 97.2°F | Ht 59.0 in | Wt 162.0 lb

## 2017-05-20 DIAGNOSIS — H53122 Transient visual loss, left eye: Secondary | ICD-10-CM | POA: Diagnosis not present

## 2017-05-20 DIAGNOSIS — R29818 Other symptoms and signs involving the nervous system: Secondary | ICD-10-CM

## 2017-05-20 NOTE — Progress Notes (Signed)
Subjective:  Patient ID: [name], female    DOB: [birth year]  Age: 84 y.o. MRN: [id number]  CC: Advice Only (pt here today to go over her recent eye exam)   HPI [name] presents for medical check after being seen by optometry.  The patient reported to the optometrist that she had transient vision loss recurring every few days.  The most recent was now 4 days ago.  It had been happening about 3 times a month over the last few months but now is happening a little more often.  It comes and goes on its own.  There is no warning that it is going to occur.  She says she is not truly blind.  This is just that objects seen to go away but she can still see light.  Depression screen [facility] 2/9 05/20/2017 04/20/2017 03/09/2017  Decreased Interest 0 0 0  Down, Depressed, Hopeless 0 0 0  PHQ - 2 Score 0 0 0  Altered sleeping - - -  Tired, decreased energy - - -  Change in appetite - - -  Feeling bad or failure about yourself  - - -  Trouble concentrating - - -  Moving slowly or fidgety/restless - - -  Suicidal thoughts - - -  PHQ-9 Score - - -  Difficult doing work/chores - - -    History [name] has a past medical history of Arthritis, Cancer ([place]), Cataract, Diabetes mellitus without complication ([place]), Frequent UTI, Hyperlipidemia, Hypertension, Neck pain, Scoliosis, and Stroke ([place]).   She has a past surgical history that includes Appendectomy; Cholecystectomy; Tonsillectomy; Joint replacement; Eye surgery; Replacement total knee bilateral (Bilateral); and Breast lumpectomy (Left).   Her family history includes Alcohol abuse in her brother; Arthritis in her mother; Cancer in her brother and mother; Heart disease in her father.She reports that  has never smoked. she has never used smokeless tobacco. She reports that she does not drink alcohol or use drugs.    ROS Review of Systems  Constitutional: Negative for activity change, appetite change and fever.  HENT: Negative for  congestion, rhinorrhea and sore throat.   Eyes: Positive for visual disturbance (See HPI). Negative for photophobia.  Respiratory: Negative for cough and shortness of breath.   Cardiovascular: Negative for chest pain and palpitations.  Gastrointestinal: Negative for abdominal pain, diarrhea and nausea.  Genitourinary: Negative for dysuria.  Musculoskeletal: Negative for arthralgias and myalgias.    Objective:  BP 130/65   Pulse 69   Temp (!) 97.2 F (36.2 C) (Oral)   Ht 4\' 11"  (1.499 m)   Wt 162 lb (73.5 kg)   BMI 32.72 kg/m   BP Readings from Last 3 Encounters:  05/20/17 130/65  04/20/17 135/71  03/09/17 (!) 151/72    Wt Readings from Last 3 Encounters:  05/20/17 162 lb (73.5 kg)  04/20/17 162 lb (73.5 kg)  03/09/17 159 lb (72.1 kg)     Physical Exam  Constitutional: She is oriented to person, place, and time. She appears well-developed and well-nourished. No distress.  HENT:  Head: Normocephalic and atraumatic.  Eyes: Conjunctivae are normal. Pupils are equal, round, and reactive to light.  Neck: Normal range of motion. Neck supple. No thyromegaly present.  Cardiovascular: Normal rate, regular rhythm and normal heart sounds.  No murmur heard. Pulmonary/Chest: Effort normal and breath sounds normal. No respiratory distress. She has no wheezes. She has no rales.  Abdominal: Soft. There is no tenderness.  Musculoskeletal: Normal range of motion.  Lymphadenopathy:    She has no cervical adenopathy.  Neurological: She is alert and oriented to person, place, and time.  Skin: Skin is warm and dry.  Psychiatric: She has a normal mood and affect. Her behavior is normal. Judgment and thought content normal.      Assessment & Plan:   [name] was seen today for advice only.  Diagnoses and all orders for this visit:  Transient blindness of left eye -     US Carotid Duplex Bilateral; Future -     Ambulatory referral to Ophthalmology  Other symptoms and signs involving  the nervous system -     MR Brain W Wo Contrast; Future -     US Carotid Duplex Bilateral; Future -     Ambulatory referral to Ophthalmology    I am concerned this may represent TIAs.  She will need referral to ophthalmology.  Consider neurology evaluation as well.  Will base that on the outcome of the above workup.   I have discontinued [name]'s predniSONE. I am also having her maintain her Aspirin-Caffeine (BAYER BACK & BODY PAIN EX ST PO), Calcium Carbonate-Vit D-Min (CALCIUM 1200 PO), Vitamin D3, meloxicam, Magnesium, ONE TOUCH ULTRA TEST, Cyanocobalamin (VITAMIN B 12 PO), gabapentin, Insulin Pen Needle, nitrofurantoin (macrocrystal-monohydrate), omeprazole, rOPINIRole, Insulin Glargine, triamcinolone, HYDROcodone-acetaminophen, diltiazem, and metFORMIN.  Allergies as of 05/20/2017      Reactions   Diltiazem Hcl    Red itchy rash started a few hours after taking short acting 120mg  dilt tabs   Ace Inhibitors Cough      Medication List        Accurate as of 05/20/17 11:59 PM. Always use your most recent med list.          BAYER BACK & BODY PAIN EX ST PO Take 1 tablet by mouth daily as needed (pain).   CALCIUM 1200 PO Take 1 tablet by mouth every other day.   diltiazem 180 MG 24 hr capsule Commonly known as:  DILT-XR Take 1 capsule (180 mg total) by mouth daily.   gabapentin 100 MG capsule Commonly known as:  NEURONTIN Take 3 capsules (300 mg total) by mouth 2 (two) times daily.   HYDROcodone-acetaminophen 5-325 MG tablet Commonly known as:  NORCO Take 1 tablet every 6 (six) hours as needed by mouth for moderate pain.   Insulin Glargine 100 UNIT/ML Solostar Pen Commonly known as:  LANTUS SOLOSTAR Inject 21 units subcutaneously once daily at 10 PM as directed   Insulin Pen Needle 31G X 8 MM Misc Use once daily with lantus solostar   Magnesium 250 MG Tabs Take 2 tablets (500 mg total) by mouth 2 (two) times daily.   meloxicam 15 MG tablet Commonly known  as:  MOBIC Take 15 mg by mouth daily.   metFORMIN 500 MG tablet Commonly known as:  GLUCOPHAGE Take 1 Tablet by mouth once daily with BREAKFAST   nitrofurantoin (macrocrystal-monohydrate) 100 MG capsule Commonly known as:  MACROBID Take 1 capsule (100 mg total) by mouth at bedtime.   omeprazole 20 MG capsule Commonly known as:  PRILOSEC Take 1 Capsule by mouth 2 times a day BEFORE meals   ONE TOUCH ULTRA TEST test strip Generic drug:  glucose blood USE TO check blood sugars twice daily   rOPINIRole 0.25 MG tablet Commonly known as:  REQUIP take 1  Tablet by mouth 3 times daily for leg cramps   triamcinolone 0.025 % ointment Commonly known as:  KENALOG Apply 1 application  topically 2 (two) times daily as needed.   VITAMIN B 12 PO Take 1 tablet by mouth daily.   Vitamin D3 5000 units Caps Take 1 capsule by mouth daily.        Follow-up: No Follow-up on file.  Claretta Fraise, M.D.

## 2017-05-20 NOTE — Patient Instructions (Signed)
Aspirin 325 mg E.C. Daily with food.

## 2017-05-22 ENCOUNTER — Encounter: Payer: Self-pay | Admitting: Family Medicine

## 2017-05-28 ENCOUNTER — Ambulatory Visit (HOSPITAL_COMMUNITY)
Admission: RE | Admit: 2017-05-28 | Discharge: 2017-05-28 | Disposition: A | Discharge status: Home / Self Care | Payer: PPO | Source: Ambulatory Visit | Attending: Family Medicine | Admitting: Family Medicine

## 2017-05-28 DIAGNOSIS — R531 Weakness: Secondary | ICD-10-CM | POA: Diagnosis not present

## 2017-05-28 DIAGNOSIS — I6523 Occlusion and stenosis of bilateral carotid arteries: Secondary | ICD-10-CM | POA: Insufficient documentation

## 2017-05-28 DIAGNOSIS — R29818 Other symptoms and signs involving the nervous system: Secondary | ICD-10-CM

## 2017-05-28 DIAGNOSIS — I7389 Other specified peripheral vascular diseases: Secondary | ICD-10-CM | POA: Insufficient documentation

## 2017-05-28 DIAGNOSIS — H53122 Transient visual loss, left eye: Secondary | ICD-10-CM

## 2017-05-28 LAB — POCT I-STAT CREATININE: Creatinine, Ser: 0.8 mg/dL (ref 0.44–1.00)

## 2017-05-28 MED ORDER — GADOBENATE DIMEGLUMINE 529 MG/ML IV SOLN
15.0000 mL | Freq: Once | INTRAVENOUS | Status: AC | PRN
  Administered 2017-05-28: 15 mL via INTRAVENOUS

## 2017-06-01 DIAGNOSIS — H353131 Nonexudative age-related macular degeneration, bilateral, early dry stage: Secondary | ICD-10-CM | POA: Diagnosis not present

## 2017-06-01 DIAGNOSIS — H26491 Other secondary cataract, right eye: Secondary | ICD-10-CM | POA: Diagnosis not present

## 2017-06-01 DIAGNOSIS — Z961 Presence of intraocular lens: Secondary | ICD-10-CM | POA: Diagnosis not present

## 2017-06-01 DIAGNOSIS — H02831 Dermatochalasis of right upper eyelid: Secondary | ICD-10-CM | POA: Diagnosis not present

## 2017-06-03 LAB — HM DIABETES EYE EXAM

## 2017-06-22 ENCOUNTER — Encounter: Payer: Self-pay | Admitting: Family Medicine

## 2017-06-22 ENCOUNTER — Ambulatory Visit (INDEPENDENT_AMBULATORY_CARE_PROVIDER_SITE_OTHER): Payer: PPO | Admitting: Family Medicine

## 2017-06-22 DIAGNOSIS — I10 Essential (primary) hypertension: Secondary | ICD-10-CM

## 2017-06-22 MED ORDER — DILTIAZEM HCL ER 180 MG PO CP24: 180.0000 mg | ORAL_CAPSULE | Freq: Two times a day (BID) | ORAL | 1 refills | Status: DC

## 2017-06-22 NOTE — Progress Notes (Signed)
Subjective:  Patient ID: [name], female    DOB: Feb 07, 1926  Age: 84 y.o. MRN: [id number]  CC: Follow-up (pt here today for 1 month f/u to go over MRI and carotid U/S)   HPI [name] presents for follow-up of blood pressure.  She brought in several readings that show systolic ranging from 419-379.  However she notes that she had a cold or the flu during that time.  Since it got better she has had readings from 024-097 systolic.  All of the diastolic readings throughout the time have been in normal range.  She denies any TIA symptoms currently.  No chest pain and no focal weakness.  She is concerned about the results of her MRI.  Additionally the carotid ultrasound report was reviewed.  Depression screen [facility] 2/9 06/22/2017 05/20/2017 04/20/2017  Decreased Interest 0 0 0  Down, Depressed, Hopeless 0 0 0  PHQ - 2 Score 0 0 0  Altered sleeping - - -  Tired, decreased energy - - -  Change in appetite - - -  Feeling bad or failure about yourself  - - -  Trouble concentrating - - -  Moving slowly or fidgety/restless - - -  Suicidal thoughts - - -  PHQ-9 Score - - -  Difficult doing work/chores - - -    History [name] has a past medical history of Arthritis, Cancer ([place]), Cataract, Diabetes mellitus without complication ([place]), Frequent UTI, Hyperlipidemia, Hypertension, Neck pain, Scoliosis, and Stroke ([place]).   She has a past surgical history that includes Appendectomy; Cholecystectomy; Tonsillectomy; Joint replacement; Eye surgery; Replacement total knee bilateral (Bilateral); and Breast lumpectomy (Left).   Her family history includes Alcohol abuse in her brother; Arthritis in her mother; Cancer in her brother and mother; Heart disease in her father.She reports that  has never smoked. she has never used smokeless tobacco. She reports that she does not drink alcohol or use drugs.    ROS Review of Systems Unremarkable with exception of HPI Objective:  BP (!) 153/71   Pulse  77   Temp (!) 97 F (36.1 C) (Oral)   Ht 4\' 11"  (1.499 m)   Wt 159 lb (72.1 kg)   BMI 32.11 kg/m   BP Readings from Last 3 Encounters:  06/22/17 (!) 153/71  05/20/17 130/65  04/20/17 135/71    Wt Readings from Last 3 Encounters:  06/22/17 159 lb (72.1 kg)  05/20/17 162 lb (73.5 kg)  04/20/17 162 lb (73.5 kg)     Physical Exam  Constitutional: She is oriented to person, place, and time. She appears well-developed and well-nourished. No distress.  Cardiovascular: Normal rate and regular rhythm.  Pulmonary/Chest: Breath sounds normal.  Neurological: She is alert and oriented to person, place, and time.  Skin: Skin is warm and dry.  Psychiatric: She has a normal mood and affect.      Assessment & Plan:   [name] was seen today for follow-up.  Diagnoses and all orders for this visit:  Essential hypertension -     diltiazem (DILT-XR) 180 MG 24 hr capsule; Take 1 capsule (180 mg total) by mouth 2 (two) times daily.    The MRI report was explained to her in detail.  The microvascular changes do not represent a strong stroke risk.  They could overtime contribute to loss of mentation however as long as they are limited to the white matter it is unlikely that she would experience memory loss.  The carotid ultrasound report was  reviewed as well showing non-occlusive plaque only.   I have changed [name]'s diltiazem. I am also having her maintain her Aspirin-Caffeine (BAYER BACK & BODY PAIN EX ST PO), Calcium Carbonate-Vit D-Min (CALCIUM 1200 PO), Vitamin D3, meloxicam, Magnesium, ONE TOUCH ULTRA TEST, Cyanocobalamin (VITAMIN B 12 PO), gabapentin, Insulin Pen Needle, nitrofurantoin (macrocrystal-monohydrate), omeprazole, rOPINIRole, Insulin Glargine, triamcinolone, HYDROcodone-acetaminophen, metFORMIN, and Multiple Vitamins-Minerals (PRESERVISION AREDS PO).  Allergies as of 06/22/2017      Reactions   Diltiazem Hcl    Red itchy rash started a few hours after taking short  acting 120mg  dilt tabs   Ace Inhibitors Cough      Medication List        Accurate as of 06/22/17  6:42 PM. Always use your most recent med list.          BAYER BACK & BODY PAIN EX ST PO Take 1 tablet by mouth daily as needed (pain).   CALCIUM 1200 PO Take 1 tablet by mouth every other day.   diltiazem 180 MG 24 hr capsule Commonly known as:  DILT-XR Take 1 capsule (180 mg total) by mouth 2 (two) times daily.   gabapentin 100 MG capsule Commonly known as:  NEURONTIN Take 3 capsules (300 mg total) by mouth 2 (two) times daily.   HYDROcodone-acetaminophen 5-325 MG tablet Commonly known as:  NORCO Take 1 tablet every 6 (six) hours as needed by mouth for moderate pain.   Insulin Glargine 100 UNIT/ML Solostar Pen Commonly known as:  LANTUS SOLOSTAR Inject 21 units subcutaneously once daily at 10 PM as directed   Insulin Pen Needle 31G X 8 MM Misc Use once daily with lantus solostar   Magnesium 250 MG Tabs Take 2 tablets (500 mg total) by mouth 2 (two) times daily.   meloxicam 15 MG tablet Commonly known as:  MOBIC Take 15 mg by mouth daily.   metFORMIN 500 MG tablet Commonly known as:  GLUCOPHAGE Take 1 Tablet by mouth once daily with BREAKFAST   nitrofurantoin (macrocrystal-monohydrate) 100 MG capsule Commonly known as:  MACROBID Take 1 capsule (100 mg total) by mouth at bedtime.   omeprazole 20 MG capsule Commonly known as:  PRILOSEC Take 1 Capsule by mouth 2 times a day BEFORE meals   ONE TOUCH ULTRA TEST test strip Generic drug:  glucose blood USE TO check blood sugars twice daily   PRESERVISION AREDS PO Take by mouth.   rOPINIRole 0.25 MG tablet Commonly known as:  REQUIP take 1  Tablet by mouth 3 times daily for leg cramps   triamcinolone 0.025 % ointment Commonly known as:  KENALOG Apply 1 application topically 2 (two) times daily as needed.   VITAMIN B 12 PO Take 1 tablet by mouth daily.   Vitamin D3 5000 units Caps Take 1 capsule by mouth  daily.      Patient reassured.  Follow-up: Return in about 1 month (around 07/20/2017) for diabetes.  [name], M.D.

## 2017-06-27 ENCOUNTER — Other Ambulatory Visit: Payer: Self-pay | Admitting: *Deleted

## 2017-06-27 MED ORDER — GABAPENTIN 100 MG PO CAPS: 300.0000 mg | ORAL_CAPSULE | Freq: Two times a day (BID) | ORAL | 1 refills | Status: DC

## 2017-07-09 ENCOUNTER — Other Ambulatory Visit: Payer: Self-pay | Admitting: Family Medicine

## 2017-07-14 ENCOUNTER — Ambulatory Visit (INDEPENDENT_AMBULATORY_CARE_PROVIDER_SITE_OTHER): Payer: PPO | Admitting: Family Medicine

## 2017-07-14 ENCOUNTER — Ambulatory Visit (INDEPENDENT_AMBULATORY_CARE_PROVIDER_SITE_OTHER): Payer: PPO

## 2017-07-14 ENCOUNTER — Encounter: Payer: Self-pay | Admitting: Family Medicine

## 2017-07-14 VITALS — BP 173/76 | HR 55 | Temp 96.7°F | Ht 59.0 in | Wt 159.0 lb

## 2017-07-14 DIAGNOSIS — M25551 Pain in right hip: Secondary | ICD-10-CM

## 2017-07-14 DIAGNOSIS — G8929 Other chronic pain: Secondary | ICD-10-CM

## 2017-07-14 DIAGNOSIS — R030 Elevated blood-pressure reading, without diagnosis of hypertension: Secondary | ICD-10-CM | POA: Diagnosis not present

## 2017-07-14 DIAGNOSIS — M545 Low back pain: Secondary | ICD-10-CM | POA: Diagnosis not present

## 2017-07-14 DIAGNOSIS — M25561 Pain in right knee: Secondary | ICD-10-CM

## 2017-07-14 NOTE — Progress Notes (Signed)
Subjective: CC: right hip pain PCP: Claretta Fraise, MD HPI:[name] is a 84 y.o. female presenting to clinic today for:  1. Right hip pain Patient reports acute worsening of right hip pain and right knee pain since Saturday evening.  She describes as a severe aching with radiation to her right buttock and right groin.  Denies associated numbness or tingling.  She does note weakness without falls.  She has had no preceding injury or change in activity prior to onset of symptoms.  She has been using a topical rub with little improvement in symptoms.  She notes a history of bilateral knee replacement many years ago.  Denies joint swelling, discoloration.  She has full active range of motion of bilateral lower extremities she uses a cane for ambulation at baseline.  ROS: Per HPI  Allergies  Allergen Reactions  . Diltiazem Hcl     Red itchy rash started a few hours after taking short acting 120mg  dilt tabs  . Ace Inhibitors Cough   Past Medical History:  Diagnosis Date  . Arthritis   . Cancer ([place])    breast  . Cataract   . Diabetes mellitus without complication ([place])   . Frequent UTI   . Hyperlipidemia   . Hypertension   . Neck pain   . Scoliosis   . Stroke [facility])     Current Outpatient Medications:  .  Aspirin-Caffeine (BAYER BACK & BODY PAIN EX ST PO), Take 1 tablet by mouth daily as needed (pain). , Disp: , Rfl:  .  Calcium Carbonate-Vit D-Min (CALCIUM 1200 PO), Take 1 tablet by mouth every other day. , Disp: , Rfl:  .  Cholecalciferol (VITAMIN D3) 5000 units CAPS, Take 1 capsule by mouth daily., Disp: , Rfl:  .  Cyanocobalamin (VITAMIN B 12 PO), Take 1 tablet by mouth daily., Disp: , Rfl:  .  diltiazem (DILT-XR) 180 MG 24 hr capsule, Take 1 capsule (180 mg total) by mouth 2 (two) times daily., Disp: 180 capsule, Rfl: 1 .  gabapentin (NEURONTIN) 100 MG capsule, Take 3 capsules (300 mg total) by mouth 2 (two) times daily., Disp: 540 capsule, Rfl: 1 .   HYDROcodone-acetaminophen (NORCO) 5-325 MG tablet, Take 1 tablet every 6 (six) hours as needed by mouth for moderate pain. (Patient not taking: Reported on 07/14/2017), Disp: 20 tablet, Rfl: 0 .  Insulin Glargine (LANTUS SOLOSTAR) 100 UNIT/ML Solostar Pen, Inject 21 units subcutaneously once daily at 10 PM as directed, Disp: 15 mL, Rfl: 0 .  Insulin Pen Needle 31G X 8 MM MISC, Use once daily with lantus solostar, Disp: 100 each, Rfl: 6 .  Magnesium 250 MG TABS, Take 2 tablets (500 mg total) by mouth 2 (two) times daily., Disp: 120 tablet, Rfl: 11 .  meloxicam (MOBIC) 15 MG tablet, Take 15 mg by mouth daily. , Disp: , Rfl:  .  metFORMIN (GLUCOPHAGE) 500 MG tablet, Take 1 Tablet by mouth once daily with BREAKFAST, Disp: 90 tablet, Rfl: 1 .  Multiple Vitamins-Minerals (PRESERVISION AREDS PO), Take by mouth., Disp: , Rfl:  .  nitrofurantoin, macrocrystal-monohydrate, (MACROBID) 100 MG capsule, Take 1 capsule (100 mg total) by mouth at bedtime., Disp: 30 capsule, Rfl: 5 .  omeprazole (PRILOSEC) 20 MG capsule, Take 1 Capsule by mouth 2 times a day BEFORE meals, Disp: 180 capsule, Rfl: 1 .  ONE TOUCH ULTRA TEST test strip, USE TO check blood sugars twice daily, Disp: 100 each, Rfl: 12 .  rOPINIRole (REQUIP) 0.25 MG tablet,  take 1  Tablet by mouth 3 times daily for leg cramps, Disp: 90 tablet, Rfl: 0 .  triamcinolone (KENALOG) 0.025 % ointment, Apply 1 application topically 2 (two) times daily as needed., Disp: 60 g, Rfl: 0 Social History   Socioeconomic History  . Marital status: Widowed    Spouse name: Not on file  . Number of children: Not on file  . Years of education: Not on file  . Highest education level: Not on file  Social Needs  . Financial resource strain: Not on file  . Food insecurity - worry: Not on file  . Food insecurity - inability: Not on file  . Transportation needs - medical: Not on file  . Transportation needs - non-medical: Not on file  Occupational History  . Not on file    Tobacco Use  . Smoking status: Never Smoker  . Smokeless tobacco: Never Used  Substance and Sexual Activity  . Alcohol use: No    Alcohol/week: 0.0 oz  . Drug use: No  . Sexual activity: No  Other Topics Concern  . Not on file  Social History Narrative   Lives alone in a one story home.  Husband passed away in 2022-07-10.  Has 2 sons.  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  Education: specialty courses with [employer], high school education.   Family History  Problem Relation Age of Onset  . Cancer Mother        lung  . Arthritis Mother   . Heart disease Father        endocarditis  . Cancer Brother        lung, throat  . Alcohol abuse Brother     Objective: Office vital signs reviewed. BP (!) 173/76   Pulse (!) 55   Temp (!) 96.7 F (35.9 C) (Oral)   Ht 4\' 11"  (1.499 m)   Wt 159 lb (72.1 kg)   SpO2 97%   BMI 32.11 kg/m   Physical Examination:  General: Awake, alert, well nourished, No acute distress Extremities: warm, well perfused, No edema, cyanosis or clubbing; +2 pulses bilaterally MSK: antalgic gait; uses cane for ambulation  Lumbar spine: Flattening of lumbar curve noted.  No midline tenderness to palpation.  She does have tenderness to palpation over the SI joint on the right.  No palpable bony abnormalities in this region.  Negative straight leg raise.  Right hip: She has full active range of motion. 4/5 strength in hip flexion, abduction, adduction and extension.  She notes pain with muscle testing in each plane.  No tenderness to palpation of the greater trochanter. Negative FADIR/ FABER.  Left hip: She has full active range of motion. 5/5 strength in hip flexion, abduction, adduction and extension.  No tenderness to palpation of the greater trochanter. Mild pain with FADIR.  Negative FABER.  Right knee: Large, well-healed midline vertical scar noted.  No effusion, ecchymosis or erythema.  No tenderness to palpation to the patella, patellar tendon, quad tendon.   She has mild tenderness to palpation over the medial aspect of the knee.  No tenderness to palpation to the posterior popliteal fossa.  No palpable masses in this region either.  Left knee: Large, well-healed midline vertical scar noted.  No effusion, ecchymosis or erythema.  No tenderness to palpation to the patella, patellar tendon, quad tendon.  She has mild tenderness to palpation over the medial aspect of the knee.  No tenderness to palpation to the posterior popliteal fossa.  No palpable masses  in this region either. Neuro: light touch sensation grossly in tact.  No results found.   Assessment/ Plan: 84 y.o. female   1. Right hip pain Patient with significant worsening of right hip and right knee pain.  X-rays were obtained which demonstrated mild arthritic changes within the hips but more notably, degenerative changes within the lumbar spine.  I suspect that she is having referred pain into the right hip from the lumbar spine.  Still awaiting formal review by radiologist.  Her physical exam was remarkable for 4 out of 5 strength in hip flexion, abduction and adduction in extension today.  She has no neurologic abnormalities appreciated.  I placed a referral to orthopedics for further evaluation and management.  Perhaps they can offer her injections into the affected areas, as she does not wish to have surgical intervention if possible.  She has leftover Norco from a fall sustained last year and she also has meloxicam.  She will take these medications as needed as directed.  If symptoms are worsening, she develops neurologic abnormalities or any red flag symptoms, she will follow-up for reevaluation. - DG HIP UNILAT W OR W/O PELVIS 2-3 VIEWS RIGHT; Future - DG Lumbar Spine 2-3 Views; Future - Ambulatory referral to Orthopedic Surgery  2. Elevated blood pressure reading Possibly related to increased pain.  I recommend that she have this rechecked within the next week or 2.  3. Chronic pain of  right knee History of bilateral knee repair.  She may be having referred pain from her low back.  Imaging studies were not obtained of the knee today, as she did not have significant findings.  Will defer any interventions and imaging studies to her orthopedist, who she has been referred to.   Orders Placed This Encounter  Procedures  . DG HIP UNILAT W OR W/O PELVIS 2-3 VIEWS RIGHT    Standing Status:   Future    Number of Occurrences:   1    Standing Expiration Date:   09/12/2018    Order Specific Question:   Reason for Exam (SYMPTOM  OR DIAGNOSIS REQUIRED)    Answer:   right buttock/ hip pain. no injury. RLE weakness    Order Specific Question:   Preferred imaging location?    Answer:   Internal  . DG Lumbar Spine 2-3 Views    Standing Status:   Future    Number of Occurrences:   1    Standing Expiration Date:   09/13/2018    Order Specific Question:   Reason for Exam (SYMPTOM  OR DIAGNOSIS REQUIRED)    Answer:   right buttock/ hip pain. no injury. RLE weakness    Order Specific Question:   Preferred imaging location?    Answer:   Internal  . Ambulatory referral to Orthopedic Surgery    Referral Priority:   Routine    Referral Type:   Surgical    Referral Reason:   Specialty Services Required    Requested Specialty:   Orthopedic Surgery    Number of Visits Requested:   [place], [place] [phone]

## 2017-07-15 ENCOUNTER — Telehealth: Payer: Self-pay | Admitting: Family Medicine

## 2017-07-16 ENCOUNTER — Other Ambulatory Visit: Payer: Self-pay | Admitting: Family Medicine

## 2017-07-16 ENCOUNTER — Ambulatory Visit (INDEPENDENT_AMBULATORY_CARE_PROVIDER_SITE_OTHER): Payer: PPO | Admitting: Orthopaedic Surgery

## 2017-07-16 ENCOUNTER — Encounter (INDEPENDENT_AMBULATORY_CARE_PROVIDER_SITE_OTHER): Payer: Self-pay | Admitting: Orthopaedic Surgery

## 2017-07-16 VITALS — BP 154/65 | HR 58 | Ht 59.0 in | Wt 159.0 lb

## 2017-07-16 DIAGNOSIS — M7061 Trochanteric bursitis, right hip: Secondary | ICD-10-CM | POA: Insufficient documentation

## 2017-07-16 DIAGNOSIS — M5136 Other intervertebral disc degeneration, lumbar region: Secondary | ICD-10-CM

## 2017-07-16 MED ORDER — LIDOCAINE HCL 1 % IJ SOLN
0.5000 mL | INTRAMUSCULAR | Status: AC | PRN
  Administered 2017-07-16: .5 mL

## 2017-07-16 MED ORDER — PREDNISONE 10 MG PO TABS: ORAL_TABLET | ORAL | 0 refills | Status: DC

## 2017-07-16 MED ORDER — METHYLPREDNISOLONE ACETATE 40 MG/ML IJ SUSP
40.0000 mg | INTRAMUSCULAR | Status: AC | PRN
  Administered 2017-07-16: 40 mg via INTRA_ARTICULAR

## 2017-07-16 MED ORDER — BUPIVACAINE HCL 0.25 % IJ SOLN
2.0000 mL | INTRAMUSCULAR | Status: AC | PRN
  Administered 2017-07-16: 2 mL via INTRA_ARTICULAR

## 2017-07-16 NOTE — Progress Notes (Signed)
[name] inform me that she received a call from patient earlier today to note that her pain was uncontrolled with the Norco and meloxicam she has been taking.  I recommend that she completely discontinue both the Norco and meloxicam.  I have instead prescribed her a prednisone Dosepak to take over the next 6 days, while she awaits for her appointment with orthopedics.  Will defer further management to her PCP as he feels his clinically appropriate.  Please inform patient of above.  Meds ordered this encounter  Medications  . predniSONE (DELTASONE) 10 MG tablet    Sig: Take 60mg  by mouth day 1, 50mg  day 2, 40mg  day 3, 30mg  day 4, 20mg  day 5, 10mg  day 6.  Then stop.    Dispense:  21 tablet    Refill:  0

## 2017-07-16 NOTE — Telephone Encounter (Signed)
Attempted to speak with patient regarding yesterday's visit and the patient states that she only wants to speak with [name]

## 2017-07-16 NOTE — Telephone Encounter (Signed)
NA x 2 today, no voicemail

## 2017-07-16 NOTE — Progress Notes (Signed)
Pt went to ortho today, called and stopped Prednisone rx per Dr.G

## 2017-07-16 NOTE — Progress Notes (Signed)
Office Visit Note   Patient: [name]           Date of Birth: Dec 28, 1925           MRN: [id number] Visit Date: 07/16/2017              Requested by: Joan Norlander, DO [address] [place], [place] [address] PCP: Joan Fraise, MD   Assessment & Plan: Visit Diagnoses:  1. Trochanteric bursitis, right hip   2. Other intervertebral disc degeneration, lumbar region     Plan: Right greater Trochanteric Injection done today.  She was improved postinjection if she has persistent problems she can return.  Lumbar x-rays were reviewed.  She has significant spurring where she has spondylolisthesis with 2 mm retrolisthesis at L4-5 likely the symptomatic level.  Follow-Up Instructions: No Follow-up on file.   Orders:  No orders of the defined types were placed in this encounter.  No orders of the defined types were placed in this encounter.     Procedures: Large Joint Inj: R greater trochanter on 07/16/2017 1:49 PM Details: lateral approach Medications: 0.5 mL lidocaine 1 %; 2 mL bupivacaine 0.25 %; 40 mg methylPREDNISolone acetate 40 MG/ML      Clinical Data: No additional findings.   Subjective: Chief Complaint  Patient presents with  . Right Hip - Pain    HPI test test.  84 year old female with right lateral hip pain that radiates down to her knee.  Previous total knee arthroplasties 15 to 25 years ago by Dr. Mayer [name].  She thinks she might have problems with her knee but primarily has lateral hip pain that radiates down toward her knee.  No swelling in her knee.  She is been on gabapentin aspirin and took one half oxycodone left over from a broken finger.  She denies any associated bowel or bladder symptoms no back pain.  Review of Systems bilateral total knee arthroplasties performed 15-25 years ago.  Lumbar disc degeneration.  Diabetes on 20 units of insulin at night.  History of pulmonary fibrosis, GERD, breast cancer, fibromyalgia, diabetic neuropathy ambulates  with a cane.  Otherwise negative as it pertains to HPI.   Objective: Vital Signs: BP (!) 154/65   Pulse (!) 58   Ht 4\' 11"  (1.499 m)   Wt 159 lb (72.1 kg)   BMI 32.11 kg/m   Physical Exam  Constitutional: She is oriented to person, place, and time. She appears well-developed.  HENT:  Head: Normocephalic.  Right Ear: External ear normal.  Left Ear: External ear normal.  Eyes: Pupils are equal, round, and reactive to light.  Glasses.  Neck: No tracheal deviation present. No thyromegaly present.  Cardiovascular: Normal rate.  Pulmonary/Chest: Effort normal.  Abdominal: Soft.  Neurological: She is alert and oriented to person, place, and time.  Skin: Skin is warm and dry.  Psychiatric: She has a normal mood and affect. Her behavior is normal.    Ortho Exam his lower extremity edema.  Full without crepitus no swelling of either knee.  No pain with internal/external rotation of her hips.  Exquisite tenderness over the right greater trochanter.  Mild on the left.  Mild sciatic notch tenderness on the right.  Some discomfort straight leg raising 90 degrees.  Anterior tib and gastrocsoleus is strong.  Patient ambulates with a cane decreased sensation feet and ankles related to peripheral neuropathy balance is only rated at fair. Specialty Comments:  No specialty comments available.  Imaging: Previous lumbar x-rays demonstrate  significant lumbar disc degeneration.  Hips negative for osteoarthritis  Lumbar x-rays show significant narrowing at L2-3 and L4-5 with facet degenerative changes.   PMFS History: Patient Active Problem List   Diagnosis Date Noted  . Trochanteric bursitis, right hip 07/16/2017  . Other intervertebral disc degeneration, lumbar region 07/16/2017  . Osteoporosis 11/04/2016  . Diabetic polyneuropathy associated with type 2 diabetes mellitus ([place]) 11/14/2014  . Neck pain   . Fibromyalgia 02/11/2013  . CVA 08/01/2008  . Osteoarthritis 04/09/2007  . Hyperlipemia  03/08/2007  . Essential hypertension 03/08/2007  . PULMONARY FIBROSIS, POSTINFLAMMATORY 03/08/2007  . GERD 03/08/2007  . BREAST CANCER, HX OF 03/08/2007   Past Medical History:  Diagnosis Date  . Arthritis   . Cancer ([place])    breast  . Cataract   . Diabetes mellitus without complication ([place])   . Frequent UTI   . Hyperlipidemia   . Hypertension   . Neck pain   . Scoliosis   . Stroke [facility])     Family History  Problem Relation Age of Onset  . Cancer Mother        lung  . Arthritis Mother   . Heart disease Father        endocarditis  . Cancer Brother        lung, throat  . Alcohol abuse Brother     Past Surgical History:  Procedure Laterality Date  . APPENDECTOMY    . BREAST LUMPECTOMY Left   . CHOLECYSTECTOMY    . EYE SURGERY    . JOINT REPLACEMENT     bilat knee   . REPLACEMENT TOTAL KNEE BILATERAL Bilateral   . TONSILLECTOMY     Social History   Occupational History  . Not on file  Tobacco Use  . Smoking status: Never Smoker  . Smokeless tobacco: Never Used  Substance and Sexual Activity  . Alcohol use: No    Alcohol/week: 0.0 oz  . Drug use: No  . Sexual activity: No

## 2017-07-17 ENCOUNTER — Other Ambulatory Visit: Payer: Self-pay | Admitting: Family Medicine

## 2017-07-21 ENCOUNTER — Ambulatory Visit: Payer: PPO | Admitting: Family Medicine

## 2017-07-27 ENCOUNTER — Telehealth (INDEPENDENT_AMBULATORY_CARE_PROVIDER_SITE_OTHER): Payer: Self-pay | Admitting: Orthopaedic Surgery

## 2017-07-27 NOTE — Telephone Encounter (Signed)
Patient didn't go into much detail, but she states she would like to speak with you about prednisone. CB # [id number]

## 2017-07-28 NOTE — Telephone Encounter (Signed)
Patient will be busy from noon til 2 today, if you give her a call back sometime after then. Thank you.

## 2017-07-29 NOTE — Telephone Encounter (Signed)
I called patient and advised. 

## 2017-07-29 NOTE — Telephone Encounter (Signed)
Ucall. Give shot time to improve. Prednisone would help but it will make her bones weak and she may have a fracture which would not be good. Prednisone type medication was in the shot I gave her.

## 2017-07-29 NOTE — Telephone Encounter (Signed)
I called and spoke with patient. She states that the right troch injection on 07/16/17 helped some. She was unable to walk previously but can walk now. She does continue to have pain. She spoke with a personal friend, a nurse with [facility], who said prednisone may help her. She would like to know if you would prescribe prednisone. Please advise.

## 2017-07-31 ENCOUNTER — Encounter: Payer: Self-pay | Admitting: Family Medicine

## 2017-07-31 ENCOUNTER — Ambulatory Visit (INDEPENDENT_AMBULATORY_CARE_PROVIDER_SITE_OTHER): Payer: PPO | Admitting: Family Medicine

## 2017-07-31 VITALS — BP 139/74 | HR 62 | Ht 59.0 in | Wt 156.0 lb

## 2017-07-31 DIAGNOSIS — I10 Essential (primary) hypertension: Secondary | ICD-10-CM | POA: Diagnosis not present

## 2017-07-31 DIAGNOSIS — E782 Mixed hyperlipidemia: Secondary | ICD-10-CM

## 2017-07-31 DIAGNOSIS — E1142 Type 2 diabetes mellitus with diabetic polyneuropathy: Secondary | ICD-10-CM

## 2017-07-31 LAB — URINALYSIS
Bilirubin, UA: NEGATIVE
Glucose, UA: NEGATIVE
Ketones, UA: NEGATIVE
LEUKOCYTES UA: NEGATIVE
NITRITE UA: NEGATIVE
PH UA: 7 (ref 5.0–7.5)
Protein, UA: NEGATIVE
RBC, UA: NEGATIVE
Specific Gravity, UA: 1.015 (ref 1.005–1.030)
Urobilinogen, Ur: 0.2 mg/dL (ref 0.2–1.0)

## 2017-07-31 LAB — BAYER DCA HB A1C WAIVED: HB A1C (BAYER DCA - WAIVED): 6 % (ref <7.0)

## 2017-07-31 NOTE — Progress Notes (Signed)
Subjective:  Patient ID: [name], female    DOB: 11/19/1925  Age: 84 y.o. MRN: [id number]  CC: No chief complaint on file.   HPI [name] presents for follow-up of diabetes. Patient does not check blood sugar at home Patient denies symptoms such as polyuria, polydipsia, excessive hunger, nausea No significant hypoglycemic spells noted. Medications as noted below. Taking them regularly without complication/adverse reaction being reported today.   Follow-up of hypertension. Patient has no history of headache chest pain or shortness of breath or recent cough. Patient also denies symptoms of TIA such as numbness weakness lateralizing. Patient checks  blood pressure at home and has not had any elevated readings recently. Patient denies side effects from his medication. States taking it regularly.   Depression screen [facility] 2/9 06/22/2017 05/20/2017 04/20/2017  Decreased Interest 0 0 0  Down, Depressed, Hopeless 0 0 0  PHQ - 2 Score 0 0 0  Altered sleeping - - -  Tired, decreased energy - - -  Change in appetite - - -  Feeling bad or failure about yourself  - - -  Trouble concentrating - - -  Moving slowly or fidgety/restless - - -  Suicidal thoughts - - -  PHQ-9 Score - - -  Difficult doing work/chores - - -  Some recent data might be hidden    History [name] has a past medical history of Arthritis, Cancer ([place]), Cataract, Diabetes mellitus without complication ([place]), Frequent UTI, Hyperlipidemia, Hypertension, Neck pain, Scoliosis, and Stroke ([place]).   She has a past surgical history that includes Appendectomy; Cholecystectomy; Tonsillectomy; Joint replacement; Eye surgery; Replacement total knee bilateral (Bilateral); and Breast lumpectomy (Left).   Her family history includes Alcohol abuse in her brother; Arthritis in her mother; Cancer in her brother and mother; Heart disease in her father.She reports that she has never smoked. She has never used smokeless tobacco. She  reports that she does not drink alcohol or use drugs.    ROS Review of Systems  Constitutional: Negative.   HENT: Negative for congestion.   Eyes: Negative for visual disturbance.  Respiratory: Negative for shortness of breath.   Cardiovascular: Negative for chest pain.  Gastrointestinal: Negative for abdominal pain, constipation, diarrhea, nausea and vomiting.  Genitourinary: Negative for difficulty urinating.  Musculoskeletal: Negative for arthralgias and myalgias.  Neurological: Negative for headaches.  Psychiatric/Behavioral: Negative for sleep disturbance.    Objective:  BP 139/74   Pulse 62   Ht _0  (1.499 m)   Wt 156 lb (70.8 kg)   BMI 31.51 kg/m   BP Readings from Last 3 Encounters:  07/31/17 139/74  07/16/17 (!) 154/65  07/14/17 (!) 173/76    Wt Readings from Last 3 Encounters:  07/31/17 156 lb (70.8 kg)  07/16/17 159 lb (72.1 kg)  07/14/17 159 lb (72.1 kg)     Physical Exam  Constitutional: She is oriented to person, place, and time. She appears well-developed and well-nourished. No distress.  HENT:  Head: Normocephalic and atraumatic.  Right Ear: External ear normal.  Left Ear: External ear normal.  Neck: Normal range of motion. Neck supple.  Cardiovascular: Normal rate, regular rhythm and normal heart sounds.  No murmur heard. Pulmonary/Chest: Effort normal and breath sounds normal. No respiratory distress. She has no wheezes. She has no rales.  Abdominal: Soft. There is no tenderness.  Musculoskeletal: Normal range of motion. She exhibits no edema or tenderness.  Neurological: She is alert and oriented to person, place, and time. She  exhibits normal muscle tone. Coordination normal.  Skin: Skin is warm and dry.  Psychiatric: She has a normal mood and affect. Her behavior is normal.     Results for orders placed or performed in visit on 07/31/17  CBC with Differential/Platelet  Result Value Ref Range   WBC 9.0 3.4 - 10.8 x10E3/uL   RBC 5.08  3.77 - 5.28 x10E6/uL   Hemoglobin 12.3 11.1 - 15.9 g/dL   Hematocrit 40.0 34.0 - 46.6 %   MCV 79 79 - 97 fL   MCH 24.2 (L) 26.6 - 33.0 pg   MCHC 30.8 (L) 31.5 - 35.7 g/dL   RDW 16.5 (H) 12.3 - 15.4 %   Platelets 261 150 - 379 x10E3/uL   Neutrophils 72 Not Estab. %   Lymphs 17 Not Estab. %   Monocytes 9 Not Estab. %   Eos 2 Not Estab. %   Basos 0 Not Estab. %   Neutrophils Absolute 6.4 1.4 - 7.0 x10E3/uL   Lymphocytes Absolute 1.5 0.7 - 3.1 x10E3/uL   Monocytes Absolute 0.8 0.1 - 0.9 x10E3/uL   EOS (ABSOLUTE) 0.2 0.0 - 0.4 x10E3/uL   Basophils Absolute 0.0 0.0 - 0.2 x10E3/uL   Immature Granulocytes 0 Not Estab. %   Immature Grans (Abs) 0.0 0.0 - 0.1 x10E3/uL  CMP14+EGFR  Result Value Ref Range   Glucose 86 65 - 99 mg/dL   BUN 21 8 - 27 mg/dL   Creatinine, Ser 0.93 0.57 - 1.00 mg/dL   GFR calc non Af Amer 55 (L) >59 mL/min/1.73   GFR calc Af Amer 63 >59 mL/min/1.73   BUN/Creatinine Ratio 23 12 - 28   Sodium 143 134 - 144 mmol/L   Potassium 4.3 3.5 - 5.2 mmol/L   Chloride 102 96 - 106 mmol/L   CO2 25 20 - 29 mmol/L   Calcium 9.9 8.7 - 10.3 mg/dL   Total Protein 6.7 6.0 - 8.5 g/dL   Albumin 4.2 3.5 - 4.7 g/dL   Globulin, Total 2.5 1.5 - 4.5 g/dL   Albumin/Globulin Ratio 1.7 1.2 - 2.2   Bilirubin Total 0.3 0.0 - 1.2 mg/dL   Alkaline Phosphatase 71 39 - 117 IU/L   AST 23 0 - 40 IU/L   ALT 21 0 - 32 IU/L  Lipid panel  Result Value Ref Range   Cholesterol, Total 239 (H) 100 - 199 mg/dL   Triglycerides 88 0 - 149 mg/dL   HDL 53 >39 mg/dL   VLDL Cholesterol Cal 18 5 - 40 mg/dL   LDL Calculated 168 (H) 0 - 99 mg/dL   Chol/HDL Ratio 4.5 (H) 0.0 - 4.4 ratio  Bayer DCA Hb A1c Waived  Result Value Ref Range   Bayer DCA Hb A1c Waived 6.0 <7.0 %  Urinalysis  Result Value Ref Range   Specific Gravity, UA 1.015 1.005 - 1.030   pH, UA 7.0 5.0 - 7.5   Color, UA Yellow Yellow   Appearance Ur Clear Clear   Leukocytes, UA Negative Negative   Protein, UA Negative Negative/Trace    Glucose, UA Negative Negative   Ketones, UA Negative Negative   RBC, UA Negative Negative   Bilirubin, UA Negative Negative   Urobilinogen, Ur 0.2 0.2 - 1.0 mg/dL   Nitrite, UA Negative Negative    Assessment & Plan:   Diagnoses and all orders for this visit:  Essential hypertension -     CBC with Differential/Platelet -     CMP14+EGFR  Diabetic polyneuropathy associated with type  2 diabetes mellitus ([place]) -     Bayer DCA Hb A1c Waived -     Urinalysis  Mixed hyperlipidemia -     Lipid panel       I have discontinued Jorgia A. Deloatch's predniSONE. I am also having her maintain her Aspirin-Caffeine (BAYER BACK & BODY PAIN EX ST PO), Calcium Carbonate-Vit D-Min (CALCIUM 1200 PO), Vitamin D3, meloxicam, Magnesium, ONE TOUCH ULTRA TEST, Cyanocobalamin (VITAMIN B 12 PO), Insulin Pen Needle, nitrofurantoin (macrocrystal-monohydrate), omeprazole, rOPINIRole, triamcinolone, metFORMIN, Multiple Vitamins-Minerals (PRESERVISION AREDS PO), diltiazem, gabapentin, and Insulin Glargine.  Allergies as of 07/31/2017      Reactions   Diltiazem Hcl    Red itchy rash started a few hours after taking short acting 150m dilt tabs   Ace Inhibitors Cough      Medication List        Accurate as of 07/31/17 11:59 PM. Always use your most recent med list.          BAYER BACK & BODY PAIN EX ST PO Take 1 tablet by mouth daily as needed (pain).   CALCIUM 1200 PO Take 1 tablet by mouth every other day.   diltiazem 180 MG 24 hr capsule Commonly known as:  DILT-XR Take 1 capsule (180 mg total) by mouth 2 (two) times daily.   gabapentin 100 MG capsule Commonly known as:  NEURONTIN Take 3 capsules (300 mg total) by mouth 2 (two) times daily.   HYDROcodone-acetaminophen 5-325 MG tablet Commonly known as:  NORCO Take 1 tablet every 6 (six) hours as needed by mouth for moderate pain.   Insulin Glargine 100 UNIT/ML Solostar Pen Commonly known as:  LANTUS SOLOSTAR INJECT UP TO 50 UNITS ONCE  DAILY AT 10PM   Insulin Pen Needle 31G X 8 MM Misc Use once daily with lantus solostar   Magnesium 250 MG Tabs Take 2 tablets (500 mg total) by mouth 2 (two) times daily.   meloxicam 15 MG tablet Commonly known as:  MOBIC Take 15 mg by mouth daily.   metFORMIN 500 MG tablet Commonly known as:  GLUCOPHAGE Take 1 Tablet by mouth once daily with BREAKFAST   nitrofurantoin (macrocrystal-monohydrate) 100 MG capsule Commonly known as:  MACROBID Take 1 capsule (100 mg total) by mouth at bedtime.   omeprazole 20 MG capsule Commonly known as:  PRILOSEC Take 1 Capsule by mouth 2 times a day BEFORE meals   ONE TOUCH ULTRA TEST test strip Generic drug:  glucose blood USE TO check blood sugars twice daily   PRESERVISION AREDS PO Take by mouth.   rOPINIRole 0.25 MG tablet Commonly known as:  REQUIP take 1  Tablet by mouth 3 times daily for leg cramps   triamcinolone 0.025 % ointment Commonly known as:  KENALOG Apply 1 application topically 2 (two) times daily as needed.   VITAMIN B 12 PO Take 1 tablet by mouth daily.   Vitamin D3 5000 units Caps Take 1 capsule by mouth daily.        Follow-up: Return in about 3 months (around 10/31/2017).  WClaretta Fraise M.D.

## 2017-08-01 LAB — CMP14+EGFR
ALBUMIN: 4.2 g/dL (ref 3.5–4.7)
ALK PHOS: 71 IU/L (ref 39–117)
ALT: 21 IU/L (ref 0–32)
AST: 23 IU/L (ref 0–40)
Albumin/Globulin Ratio: 1.7 (ref 1.2–2.2)
BUN/Creatinine Ratio: 23 (ref 12–28)
BUN: 21 mg/dL (ref 8–27)
Bilirubin Total: 0.3 mg/dL (ref 0.0–1.2)
CALCIUM: 9.9 mg/dL (ref 8.7–10.3)
CO2: 25 mmol/L (ref 20–29)
CREATININE: 0.93 mg/dL (ref 0.57–1.00)
Chloride: 102 mmol/L (ref 96–106)
GFR calc Af Amer: 63 mL/min/{1.73_m2} (ref >59)
GFR calc non Af Amer: 55 mL/min/{1.73_m2} — ABNORMAL LOW (ref >59)
GLUCOSE: 86 mg/dL (ref 65–99)
Globulin, Total: 2.5 g/dL (ref 1.5–4.5)
Potassium: 4.3 mmol/L (ref 3.5–5.2)
Sodium: 143 mmol/L (ref 134–144)
Total Protein: 6.7 g/dL (ref 6.0–8.5)

## 2017-08-01 LAB — LIPID PANEL
CHOLESTEROL TOTAL: 239 mg/dL — AB (ref 100–199)
Chol/HDL Ratio: 4.5 ratio — ABNORMAL HIGH (ref 0.0–4.4)
HDL: 53 mg/dL (ref >39)
LDL CALC: 168 mg/dL — AB (ref 0–99)
TRIGLYCERIDES: 88 mg/dL (ref 0–149)
VLDL CHOLESTEROL CAL: 18 mg/dL (ref 5–40)

## 2017-08-01 LAB — CBC WITH DIFFERENTIAL/PLATELET
BASOS ABS: 0 10*3/uL (ref 0.0–0.2)
Basos: 0 %
EOS (ABSOLUTE): 0.2 10*3/uL (ref 0.0–0.4)
Eos: 2 %
HEMOGLOBIN: 12.3 g/dL (ref 11.1–15.9)
Hematocrit: 40 % (ref 34.0–46.6)
IMMATURE GRANULOCYTES: 0 %
Immature Grans (Abs): 0 10*3/uL (ref 0.0–0.1)
LYMPHS ABS: 1.5 10*3/uL (ref 0.7–3.1)
Lymphs: 17 %
MCH: 24.2 pg — ABNORMAL LOW (ref 26.6–33.0)
MCHC: 30.8 g/dL — AB (ref 31.5–35.7)
MCV: 79 fL (ref 79–97)
MONOCYTES: 9 %
Monocytes Absolute: 0.8 10*3/uL (ref 0.1–0.9)
Neutrophils Absolute: 6.4 10*3/uL (ref 1.4–7.0)
Neutrophils: 72 %
Platelets: 261 10*3/uL (ref 150–379)
RBC: 5.08 x10E6/uL (ref 3.77–5.28)
RDW: 16.5 % — AB (ref 12.3–15.4)
WBC: 9 10*3/uL (ref 3.4–10.8)

## 2017-08-03 ENCOUNTER — Telehealth: Payer: Self-pay | Admitting: Family Medicine

## 2017-08-04 ENCOUNTER — Other Ambulatory Visit: Payer: Self-pay | Admitting: Family Medicine

## 2017-08-04 MED ORDER — HYDROCODONE-ACETAMINOPHEN 5-325 MG PO TABS: 1.0000 | ORAL_TABLET | Freq: Four times a day (QID) | ORAL | 0 refills | Status: DC | PRN

## 2017-08-04 NOTE — Telephone Encounter (Signed)
I sent in the requested prescription 

## 2017-08-04 NOTE — Telephone Encounter (Signed)
Pt was seen last week and I forgot to put the order in for her Norco pain medication, will you send it over to the pharmacy please and pt is aware.

## 2017-08-04 NOTE — Telephone Encounter (Signed)
Pt aware.

## 2017-08-08 ENCOUNTER — Encounter: Payer: Self-pay | Admitting: Family Medicine

## 2017-08-17 ENCOUNTER — Other Ambulatory Visit: Payer: Self-pay | Admitting: *Deleted

## 2017-08-17 MED ORDER — MELOXICAM 15 MG PO TABS: 15.0000 mg | ORAL_TABLET | Freq: Every day | ORAL | 1 refills | Status: DC

## 2017-08-18 ENCOUNTER — Telehealth: Payer: Self-pay | Admitting: Family Medicine

## 2017-08-18 NOTE — Telephone Encounter (Signed)
Discussed with pt. No apparent reason for glucose to climb to 200-250 over the last few days. Had some prednisone and steroid injection one month ago. Seems rather remote to make glucose peak now. However, until glucose normalizes, I recommended that she increase her Lantus to 60 units daily temporarily. WS

## 2017-08-18 NOTE — Telephone Encounter (Signed)
March labs - have no provider notes - please address   ALSO: She says that her sugars last week were in the 90's everyday - 91, 93 Then this week - they have doubled. Like 185, 197. She states that she has not changed her diet at all.   Please address both - thanks

## 2017-09-21 ENCOUNTER — Other Ambulatory Visit: Payer: Self-pay | Admitting: Family Medicine

## 2017-10-14 ENCOUNTER — Other Ambulatory Visit: Payer: Self-pay | Admitting: Family Medicine

## 2017-10-15 ENCOUNTER — Telehealth: Payer: Self-pay | Admitting: Family Medicine

## 2017-10-15 ENCOUNTER — Other Ambulatory Visit: Payer: Self-pay | Admitting: Family Medicine

## 2017-10-15 MED ORDER — NITROFURANTOIN MONOHYD MACRO 100 MG PO CAPS: 100.0000 mg | ORAL_CAPSULE | Freq: Every day | ORAL | 5 refills | Status: DC

## 2017-10-15 NOTE — Telephone Encounter (Signed)
I sent in the requested prescription 

## 2017-10-15 NOTE — Telephone Encounter (Signed)
Pt requesting refill on Macrobid Pt takes prophylactically Has appt 10/30/2017

## 2017-10-15 NOTE — Telephone Encounter (Signed)
Pt aware.

## 2017-10-30 ENCOUNTER — Ambulatory Visit (INDEPENDENT_AMBULATORY_CARE_PROVIDER_SITE_OTHER): Payer: PPO | Admitting: Family Medicine

## 2017-10-30 ENCOUNTER — Encounter: Payer: Self-pay | Admitting: Family Medicine

## 2017-10-30 VITALS — BP 134/75 | HR 74 | Temp 97.5°F | Ht 59.0 in | Wt 159.1 lb

## 2017-10-30 DIAGNOSIS — I10 Essential (primary) hypertension: Secondary | ICD-10-CM

## 2017-10-30 DIAGNOSIS — E1142 Type 2 diabetes mellitus with diabetic polyneuropathy: Secondary | ICD-10-CM

## 2017-10-30 DIAGNOSIS — M150 Primary generalized (osteo)arthritis: Secondary | ICD-10-CM

## 2017-10-30 DIAGNOSIS — M159 Polyosteoarthritis, unspecified: Secondary | ICD-10-CM

## 2017-10-30 DIAGNOSIS — E782 Mixed hyperlipidemia: Secondary | ICD-10-CM | POA: Diagnosis not present

## 2017-10-30 LAB — BAYER DCA HB A1C WAIVED: HB A1C (BAYER DCA - WAIVED): 6.4 % (ref <7.0)

## 2017-10-30 MED ORDER — DILTIAZEM HCL ER COATED BEADS 240 MG PO CP24: 240.0000 mg | ORAL_CAPSULE | Freq: Two times a day (BID) | ORAL | 1 refills | Status: DC

## 2017-10-30 MED ORDER — OMEPRAZOLE 20 MG PO CPDR: DELAYED_RELEASE_CAPSULE | ORAL | 1 refills | Status: DC

## 2017-10-30 MED ORDER — INSULIN GLARGINE 100 UNIT/ML SOLOSTAR PEN: PEN_INJECTOR | SUBCUTANEOUS | 1 refills | Status: DC

## 2017-10-30 MED ORDER — GABAPENTIN 100 MG PO CAPS: 300.0000 mg | ORAL_CAPSULE | Freq: Two times a day (BID) | ORAL | 1 refills | Status: DC

## 2017-10-30 MED ORDER — GLUCOSE BLOOD VI STRP: ORAL_STRIP | 12 refills | Status: DC

## 2017-10-30 MED ORDER — ROPINIROLE HCL 0.25 MG PO TABS: ORAL_TABLET | ORAL | 1 refills | Status: DC

## 2017-10-30 MED ORDER — METFORMIN HCL 500 MG PO TABS: ORAL_TABLET | ORAL | 1 refills | Status: DC

## 2017-10-30 MED ORDER — INSULIN PEN NEEDLE 31G X 8 MM MISC: 6 refills | Status: DC

## 2017-10-30 NOTE — Progress Notes (Signed)
Subjective:  Patient ID: [name],  female    DOB: 1925-10-01  Age: 84 y.o.    CC: Medical Management of Chronic Issues (pt here today c/o discomfort on the left side only when lying down)   HPI [name] presents for  follow-up of hypertension. Patient has no history of headache chest pain or shortness of breath or recent cough. Patient also denies symptoms of TIA such as numbness weakness lateralizing. Patient denies side effects from medication. States taking it regularly.She brought in her blood pressures log noting that she has multiple readings in the 287-681 systolic range.  This seems to have been most frequent in April a bit less frequent and may and infrequent in June.  See the attached log.  Patient also  in for follow-up of elevated cholesterol. Doing well without complaints on current medication. Denies side effects  including myalgia and arthralgia and nausea. Also in today for liver function testing. Currently no chest pain, shortness of breath or other cardiovascular related symptoms noted.  Follow-up of diabetes. Patient does check blood sugar at home. Readings run between 110 and 160.  See attached glucose log.   Patient denies symptoms such as excessive hunger or urinary frequency, excessive hunger, nausea No significant hypoglycemic spells noted. Medications reviewed. Pt reports taking them regularly. Pt. denies complication/adverse reaction today.    History Carlia has a past medical history of Arthritis, Cancer ([place]), Cataract, Diabetes mellitus without complication ([place]), Frequent UTI, Hyperlipidemia, Hypertension, Neck pain, Scoliosis, and Stroke ([place]).   She has a past surgical history that includes Appendectomy; Cholecystectomy; Tonsillectomy; Joint replacement; Eye surgery; Replacement total knee bilateral (Bilateral); and Breast lumpectomy (Left).   Her family history includes Alcohol abuse in her brother; Arthritis in her mother; Cancer in her  brother and mother; Heart disease in her father.She reports that she has never smoked. She has never used smokeless tobacco. She reports that she does not drink alcohol or use drugs.  Current Outpatient Medications on File Prior to Visit  Medication Sig Dispense Refill  . Aspirin-Caffeine (BAYER BACK & BODY PAIN EX ST PO) Take 2 tablets by mouth 4 (four) times daily as needed (pain).     . Calcium Carbonate-Vit D-Min (CALCIUM 1200 PO) Take 1 tablet by mouth every other day.     . Cholecalciferol (VITAMIN D3) 5000 units CAPS Take 2,000 Units by mouth daily.     . Cyanocobalamin (VITAMIN B 12 PO) Take 1 tablet by mouth daily.    . Magnesium 250 MG TABS Take 2 tablets (500 mg total) by mouth 2 (two) times daily. 120 tablet 11  . meloxicam (MOBIC) 15 MG tablet Take 1 tablet (15 mg total) by mouth daily. 90 tablet 1  . Multiple Vitamins-Minerals (PRESERVISION AREDS PO) Take by mouth.    . nitrofurantoin, macrocrystal-monohydrate, (MACROBID) 100 MG capsule Take 1 capsule (100 mg total) by mouth at bedtime. 30 capsule 5   No current facility-administered medications on file prior to visit.     ROS Review of Systems  Constitutional: Negative.   HENT: Negative for congestion.   Eyes: Negative for visual disturbance.  Respiratory: Negative for shortness of breath.   Cardiovascular: Negative for chest pain.  Gastrointestinal: Negative for abdominal pain, constipation, diarrhea, nausea and vomiting.  Genitourinary: Negative for difficulty urinating.  Musculoskeletal: Positive for myalgias (Leg cramps resolved with use of the tonic water.  She enjoys it and drinks it daily.). Negative for arthralgias.  Neurological: Negative for headaches.  Psychiatric/Behavioral:  Negative for sleep disturbance.    Objective:  BP 134/75   Pulse 74   Temp (!) 97.5 F (36.4 C) (Oral)   Ht _0  (1.499 m)   Wt 159 lb 2 oz (72.2 kg)   BMI 32.14 kg/m   BP Readings from Last 3 Encounters:  10/30/17 134/75    07/31/17 139/74  07/16/17 (!) 154/65    Wt Readings from Last 3 Encounters:  10/30/17 159 lb 2 oz (72.2 kg)  07/31/17 156 lb (70.8 kg)  07/16/17 159 lb (72.1 kg)     Physical Exam  Constitutional: She is oriented to person, place, and time. She appears well-developed and well-nourished. No distress.  HENT:  Head: Normocephalic and atraumatic.  Right Ear: External ear normal.  Left Ear: External ear normal.  Nose: Nose normal.  Mouth/Throat: Oropharynx is clear and moist.  Eyes: Pupils are equal, round, and reactive to light. Conjunctivae and EOM are normal.  Neck: Normal range of motion. Neck supple. No thyromegaly present.  Cardiovascular: Normal rate, regular rhythm and normal heart sounds.  No murmur heard. Pulmonary/Chest: Effort normal and breath sounds normal. No respiratory distress. She has no wheezes. She has no rales.  Abdominal: Soft. Bowel sounds are normal. She exhibits no distension. There is no tenderness.  Lymphadenopathy:    She has no cervical adenopathy.  Neurological: She is alert and oriented to person, place, and time. She has normal reflexes.  Skin: Skin is warm and dry.  Psychiatric: She has a normal mood and affect. Her behavior is normal. Judgment and thought content normal.    Diabetic Foot Exam - Simple   No data filed        Assessment & Plan:   Tye was seen today for medical management of chronic issues.  Diagnoses and all orders for this visit:  Diabetic polyneuropathy associated with type 2 diabetes mellitus ([place]) -     CMP14+EGFR -     Bayer DCA Hb A1c Waived  Essential hypertension  Mixed hyperlipidemia  Primary osteoarthritis involving multiple joints  Other orders -     diltiazem (DILTIAZEM CD) 240 MG 24 hr capsule; Take 1 capsule (240 mg total) by mouth 2 (two) times daily. -     gabapentin (NEURONTIN) 100 MG capsule; Take 3 capsules (300 mg total) by mouth 2 (two) times daily. -     Insulin Glargine (LANTUS SOLOSTAR)  100 UNIT/ML Solostar Pen; INJECT UP TO 26 UNITS ONCE DAILY AT 10PM -     Insulin Pen Needle 31G X 8 MM MISC; Use once daily with lantus solostar -     metFORMIN (GLUCOPHAGE) 500 MG tablet; Take 1 Tablet by mouth once daily with BREAKFAST -     omeprazole (PRILOSEC) 20 MG capsule; Take 1 Capsule by mouth 2 times a day BEFORE meals -     glucose blood (ONE TOUCH ULTRA TEST) test strip; USE TO check blood sugars twice daily -     rOPINIRole (REQUIP) 0.25 MG tablet; take 1  Tablet by mouth 3 times daily for leg cramps   I have discontinued Cherry A. Crowell's triamcinolone, diltiazem, and HYDROcodone-acetaminophen. I have changed her ONE TOUCH ULTRA TEST to glucose blood. I have also changed her Insulin Glargine. Additionally, I am having her start on diltiazem. Lastly, I am having her maintain her Aspirin-Caffeine (BAYER BACK & BODY PAIN EX ST PO), Calcium Carbonate-Vit D-Min (CALCIUM 1200 PO), Vitamin D3, Magnesium, Cyanocobalamin (VITAMIN B 12 PO), Multiple Vitamins-Minerals (PRESERVISION AREDS PO), meloxicam,  nitrofurantoin (macrocrystal-monohydrate), gabapentin, Insulin Pen Needle, metFORMIN, omeprazole, and rOPINIRole.  Meds ordered this encounter  Medications  . diltiazem (DILTIAZEM CD) 240 MG 24 hr capsule    Sig: Take 1 capsule (240 mg total) by mouth 2 (two) times daily.    Dispense:  180 capsule    Refill:  1  . gabapentin (NEURONTIN) 100 MG capsule    Sig: Take 3 capsules (300 mg total) by mouth 2 (two) times daily.    Dispense:  540 capsule    Refill:  1    Please consider 90 day supplies to promote better adherence  . Insulin Glargine (LANTUS SOLOSTAR) 100 UNIT/ML Solostar Pen    Sig: INJECT UP TO 26 UNITS ONCE DAILY AT 10PM    Dispense:  15 mL    Refill:  1  . Insulin Pen Needle 31G X 8 MM MISC    Sig: Use once daily with lantus solostar    Dispense:  100 each    Refill:  6    DX: E11.42  . metFORMIN (GLUCOPHAGE) 500 MG tablet    Sig: Take 1 Tablet by mouth once daily with  BREAKFAST    Dispense:  90 tablet    Refill:  1  . omeprazole (PRILOSEC) 20 MG capsule    Sig: Take 1 Capsule by mouth 2 times a day BEFORE meals    Dispense:  180 capsule    Refill:  1    This prescription was filled on 09/10/2016. Any refills authorized will be placed on file.  Marland Kitchen glucose blood (ONE TOUCH ULTRA TEST) test strip    Sig: USE TO check blood sugars twice daily    Dispense:  100 each    Refill:  12  . rOPINIRole (REQUIP) 0.25 MG tablet    Sig: take 1  Tablet by mouth 3 times daily for leg cramps    Dispense:  90 tablet    Refill:  1    This prescription was filled on 10/31/2016. Any refills authorized will be placed on file.     Follow-up: Return in about 3 months (around 01/30/2018).  Claretta Fraise, M.D.

## 2017-10-30 NOTE — Patient Instructions (Signed)

## 2017-10-31 LAB — CMP14+EGFR
A/G RATIO: 1.8 (ref 1.2–2.2)
ALBUMIN: 4.3 g/dL (ref 3.5–4.7)
ALK PHOS: 71 IU/L (ref 39–117)
ALT: 21 IU/L (ref 0–32)
AST: 21 IU/L (ref 0–40)
BUN / CREAT RATIO: 17 (ref 12–28)
BUN: 18 mg/dL (ref 8–27)
Bilirubin Total: 0.2 mg/dL (ref 0.0–1.2)
CO2: 25 mmol/L (ref 20–29)
CREATININE: 1.08 mg/dL — AB (ref 0.57–1.00)
Calcium: 9.4 mg/dL (ref 8.7–10.3)
Chloride: 103 mmol/L (ref 96–106)
GFR calc Af Amer: 53 mL/min/{1.73_m2} — ABNORMAL LOW (ref >59)
GFR, EST NON AFRICAN AMERICAN: 46 mL/min/{1.73_m2} — AB (ref >59)
GLOBULIN, TOTAL: 2.4 g/dL (ref 1.5–4.5)
Glucose: 86 mg/dL (ref 65–99)
POTASSIUM: 4 mmol/L (ref 3.5–5.2)
SODIUM: 144 mmol/L (ref 134–144)
Total Protein: 6.7 g/dL (ref 6.0–8.5)

## 2017-11-03 ENCOUNTER — Telehealth: Payer: Self-pay | Admitting: Family Medicine

## 2017-11-03 MED ORDER — TRIAMCINOLONE ACETONIDE 0.025 % EX OINT: 1.0000 "application " | TOPICAL_OINTMENT | Freq: Two times a day (BID) | CUTANEOUS | 2 refills | Status: DC

## 2017-11-03 NOTE — Telephone Encounter (Signed)
[name] states pt is requesting refill on Triamcinolone Ointment which she has had in the past. Dr Livia Snellen approved verbally so rx sent to pharmacy.

## 2017-11-25 ENCOUNTER — Other Ambulatory Visit: Payer: Self-pay | Admitting: Family Medicine

## 2017-12-06 ENCOUNTER — Other Ambulatory Visit: Payer: Self-pay

## 2017-12-06 ENCOUNTER — Emergency Department (HOSPITAL_COMMUNITY): Payer: PPO

## 2017-12-06 ENCOUNTER — Encounter (HOSPITAL_COMMUNITY): Payer: Self-pay | Admitting: Emergency Medicine

## 2017-12-06 ENCOUNTER — Inpatient Hospital Stay (HOSPITAL_COMMUNITY)
Admission: EM | Admit: 2017-12-06 | Discharge: 2017-12-10 | DRG: 189 | Disposition: A | Discharge status: Home Health | Payer: PPO | Attending: Internal Medicine | Admitting: Internal Medicine

## 2017-12-06 DIAGNOSIS — Z888 Allergy status to other drugs, medicaments and biological substances status: Secondary | ICD-10-CM | POA: Diagnosis not present

## 2017-12-06 DIAGNOSIS — T380X5A Adverse effect of glucocorticoids and synthetic analogues, initial encounter: Secondary | ICD-10-CM | POA: Diagnosis present

## 2017-12-06 DIAGNOSIS — R0682 Tachypnea, not elsewhere classified: Secondary | ICD-10-CM | POA: Diagnosis not present

## 2017-12-06 DIAGNOSIS — I10 Essential (primary) hypertension: Secondary | ICD-10-CM

## 2017-12-06 DIAGNOSIS — E1165 Type 2 diabetes mellitus with hyperglycemia: Secondary | ICD-10-CM | POA: Diagnosis present

## 2017-12-06 DIAGNOSIS — Z79899 Other long term (current) drug therapy: Secondary | ICD-10-CM | POA: Diagnosis not present

## 2017-12-06 DIAGNOSIS — J9601 Acute respiratory failure with hypoxia: Secondary | ICD-10-CM

## 2017-12-06 DIAGNOSIS — E1159 Type 2 diabetes mellitus with other circulatory complications: Secondary | ICD-10-CM | POA: Diagnosis present

## 2017-12-06 DIAGNOSIS — E785 Hyperlipidemia, unspecified: Secondary | ICD-10-CM | POA: Diagnosis present

## 2017-12-06 DIAGNOSIS — R0989 Other specified symptoms and signs involving the circulatory and respiratory systems: Secondary | ICD-10-CM

## 2017-12-06 DIAGNOSIS — I4891 Unspecified atrial fibrillation: Secondary | ICD-10-CM | POA: Diagnosis not present

## 2017-12-06 DIAGNOSIS — Z8673 Personal history of transient ischemic attack (TIA), and cerebral infarction without residual deficits: Secondary | ICD-10-CM

## 2017-12-06 DIAGNOSIS — R0602 Shortness of breath: Secondary | ICD-10-CM | POA: Diagnosis not present

## 2017-12-06 DIAGNOSIS — R0600 Dyspnea, unspecified: Secondary | ICD-10-CM | POA: Diagnosis not present

## 2017-12-06 DIAGNOSIS — E1142 Type 2 diabetes mellitus with diabetic polyneuropathy: Secondary | ICD-10-CM | POA: Diagnosis present

## 2017-12-06 DIAGNOSIS — Z791 Long term (current) use of non-steroidal anti-inflammatories (NSAID): Secondary | ICD-10-CM

## 2017-12-06 DIAGNOSIS — I4821 Permanent atrial fibrillation: Secondary | ICD-10-CM

## 2017-12-06 DIAGNOSIS — Z794 Long term (current) use of insulin: Secondary | ICD-10-CM | POA: Diagnosis not present

## 2017-12-06 DIAGNOSIS — I110 Hypertensive heart disease with heart failure: Secondary | ICD-10-CM | POA: Diagnosis present

## 2017-12-06 DIAGNOSIS — J8410 Pulmonary fibrosis, unspecified: Secondary | ICD-10-CM | POA: Diagnosis present

## 2017-12-06 DIAGNOSIS — I959 Hypotension, unspecified: Secondary | ICD-10-CM | POA: Diagnosis not present

## 2017-12-06 DIAGNOSIS — K219 Gastro-esophageal reflux disease without esophagitis: Secondary | ICD-10-CM | POA: Diagnosis present

## 2017-12-06 DIAGNOSIS — I5033 Acute on chronic diastolic (congestive) heart failure: Secondary | ICD-10-CM | POA: Diagnosis present

## 2017-12-06 DIAGNOSIS — Z96653 Presence of artificial knee joint, bilateral: Secondary | ICD-10-CM | POA: Diagnosis not present

## 2017-12-06 DIAGNOSIS — E1169 Type 2 diabetes mellitus with other specified complication: Secondary | ICD-10-CM | POA: Diagnosis present

## 2017-12-06 DIAGNOSIS — E782 Mixed hyperlipidemia: Secondary | ICD-10-CM | POA: Diagnosis not present

## 2017-12-06 DIAGNOSIS — R0902 Hypoxemia: Secondary | ICD-10-CM

## 2017-12-06 DIAGNOSIS — R079 Chest pain, unspecified: Secondary | ICD-10-CM | POA: Diagnosis not present

## 2017-12-06 DIAGNOSIS — Z66 Do not resuscitate: Secondary | ICD-10-CM | POA: Diagnosis not present

## 2017-12-06 DIAGNOSIS — I340 Nonrheumatic mitral (valve) insufficiency: Secondary | ICD-10-CM | POA: Diagnosis not present

## 2017-12-06 DIAGNOSIS — I5032 Chronic diastolic (congestive) heart failure: Secondary | ICD-10-CM

## 2017-12-06 HISTORY — DX: Acute respiratory failure with hypoxia: J96.01

## 2017-12-06 LAB — BASIC METABOLIC PANEL
ANION GAP: 8 (ref 5–15)
BUN: 19 mg/dL (ref 8–23)
CHLORIDE: 103 mmol/L (ref 98–111)
CO2: 28 mmol/L (ref 22–32)
Calcium: 8.9 mg/dL (ref 8.9–10.3)
Creatinine, Ser: 0.7 mg/dL (ref 0.44–1.00)
Glucose, Bld: 92 mg/dL (ref 70–99)
Potassium: 3.7 mmol/L (ref 3.5–5.1)
SODIUM: 139 mmol/L (ref 135–145)

## 2017-12-06 LAB — TROPONIN I
TROPONIN I: 0.03 ng/mL — AB (ref <0.03)
Troponin I: <0.03 ng/mL (ref <0.03)

## 2017-12-06 LAB — GLUCOSE, CAPILLARY
Glucose-Capillary: 93 mg/dL (ref 70–99)
Glucose-Capillary: 91 mg/dL (ref 70–99)

## 2017-12-06 LAB — CBC WITH DIFFERENTIAL/PLATELET
Basophils Absolute: 0 10*3/uL (ref 0.0–0.1)
Basophils Relative: 0 %
EOS ABS: 0.1 10*3/uL (ref 0.0–0.7)
Eosinophils Relative: 1 %
HCT: 40.4 % (ref 36.0–46.0)
HEMOGLOBIN: 12.5 g/dL (ref 12.0–15.0)
Lymphocytes Relative: 3 %
Lymphs Abs: 0.5 10*3/uL — ABNORMAL LOW (ref 0.7–4.0)
MCH: 25.2 pg — AB (ref 26.0–34.0)
MCHC: 30.9 g/dL (ref 30.0–36.0)
MCV: 81.5 fL (ref 78.0–100.0)
MONOS PCT: 8 %
Monocytes Absolute: 1.2 10*3/uL — ABNORMAL HIGH (ref 0.1–1.0)
NEUTROS ABS: 13 10*3/uL — AB (ref 1.7–7.7)
Neutrophils Relative %: 88 %
Platelets: 255 10*3/uL (ref 150–400)
RBC: 4.96 MIL/uL (ref 3.87–5.11)
RDW: 17.3 % — ABNORMAL HIGH (ref 11.5–15.5)
WBC: 14.8 10*3/uL — AB (ref 4.0–10.5)

## 2017-12-06 LAB — BRAIN NATRIURETIC PEPTIDE: B NATRIURETIC PEPTIDE 5: 206 pg/mL — AB (ref 0.0–100.0)

## 2017-12-06 LAB — MAGNESIUM: Magnesium: 2 mg/dL (ref 1.7–2.4)

## 2017-12-06 LAB — D-DIMER, QUANTITATIVE (NOT AT ARMC): D DIMER QUANT: 1.69 ug{FEU}/mL — AB (ref 0.00–0.50)

## 2017-12-06 LAB — TSH: TSH: 0.375 u[IU]/mL (ref 0.350–4.500)

## 2017-12-06 MED ORDER — DILTIAZEM HCL ER COATED BEADS 240 MG PO CP24
240.0000 mg | ORAL_CAPSULE | Freq: Two times a day (BID) | ORAL | Status: AC
  Administered 2017-12-06: 240 mg via ORAL
  Filled 2017-12-06 (×3): qty 1

## 2017-12-06 MED ORDER — SODIUM CHLORIDE 0.9% FLUSH
3.0000 mL | Freq: Two times a day (BID) | INTRAVENOUS | Status: DC
  Administered 2017-12-06 – 2017-12-10 (×8): 3 mL via INTRAVENOUS

## 2017-12-06 MED ORDER — SODIUM CHLORIDE 0.9 % IV SOLN: 250.0000 mL | INTRAVENOUS | Status: DC | PRN

## 2017-12-06 MED ORDER — SODIUM CHLORIDE 0.9 % IV BOLUS: 500.0000 mL | Freq: Once | INTRAVENOUS | Status: DC

## 2017-12-06 MED ORDER — IPRATROPIUM-ALBUTEROL 0.5-2.5 (3) MG/3ML IN SOLN: 3.0000 mL | Freq: Once | RESPIRATORY_TRACT | Status: DC

## 2017-12-06 MED ORDER — IPRATROPIUM BROMIDE 0.02 % IN SOLN
0.5000 mg | Freq: Once | RESPIRATORY_TRACT | Status: AC
  Administered 2017-12-06: 0.5 mg via RESPIRATORY_TRACT
  Filled 2017-12-06: qty 2.5

## 2017-12-06 MED ORDER — ASPIRIN 81 MG PO CHEW
324.0000 mg | CHEWABLE_TABLET | Freq: Once | ORAL | Status: AC
  Administered 2017-12-06: 324 mg via ORAL
  Filled 2017-12-06: qty 4

## 2017-12-06 MED ORDER — INSULIN ASPART 100 UNIT/ML ~~LOC~~ SOLN
0.0000 [IU] | Freq: Three times a day (TID) | SUBCUTANEOUS | Status: DC
  Administered 2017-12-07: 3 [IU] via SUBCUTANEOUS
  Administered 2017-12-08: 5 [IU] via SUBCUTANEOUS

## 2017-12-06 MED ORDER — GABAPENTIN 300 MG PO CAPS
300.0000 mg | ORAL_CAPSULE | Freq: Two times a day (BID) | ORAL | Status: DC
  Administered 2017-12-06 – 2017-12-10 (×8): 300 mg via ORAL
  Filled 2017-12-06 (×8): qty 1

## 2017-12-06 MED ORDER — VITAMIN D 1000 UNITS PO TABS
2000.0000 [IU] | ORAL_TABLET | Freq: Every day | ORAL | Status: DC
  Administered 2017-12-07 – 2017-12-10 (×4): 2000 [IU] via ORAL
  Filled 2017-12-06 (×7): qty 2

## 2017-12-06 MED ORDER — ACETAMINOPHEN 650 MG RE SUPP: 650.0000 mg | Freq: Four times a day (QID) | RECTAL | Status: DC | PRN

## 2017-12-06 MED ORDER — INSULIN ASPART 100 UNIT/ML ~~LOC~~ SOLN
0.0000 [IU] | Freq: Every day | SUBCUTANEOUS | Status: DC
  Administered 2017-12-07: 3 [IU] via SUBCUTANEOUS

## 2017-12-06 MED ORDER — VITAMIN B-12 100 MCG PO TABS
100.0000 ug | ORAL_TABLET | Freq: Every day | ORAL | Status: DC
  Administered 2017-12-07 – 2017-12-10 (×4): 100 ug via ORAL
  Filled 2017-12-06 (×5): qty 1

## 2017-12-06 MED ORDER — LEVALBUTEROL HCL 0.63 MG/3ML IN NEBU
0.6300 mg | INHALATION_SOLUTION | Freq: Once | RESPIRATORY_TRACT | Status: AC
  Administered 2017-12-06: 0.63 mg via RESPIRATORY_TRACT
  Filled 2017-12-06: qty 3

## 2017-12-06 MED ORDER — IPRATROPIUM BROMIDE 0.02 % IN SOLN
0.5000 mg | Freq: Three times a day (TID) | RESPIRATORY_TRACT | Status: AC
  Administered 2017-12-06 (×2): 0.5 mg via RESPIRATORY_TRACT
  Filled 2017-12-06 (×2): qty 2.5

## 2017-12-06 MED ORDER — TRIAMCINOLONE ACETONIDE 0.025 % EX OINT: 1.0000 "application " | TOPICAL_OINTMENT | Freq: Every day | CUTANEOUS | Status: DC

## 2017-12-06 MED ORDER — IPRATROPIUM BROMIDE 0.02 % IN SOLN: 0.5000 mg | Freq: Three times a day (TID) | RESPIRATORY_TRACT | Status: DC

## 2017-12-06 MED ORDER — ONDANSETRON HCL 4 MG PO TABS: 4.0000 mg | ORAL_TABLET | Freq: Four times a day (QID) | ORAL | Status: DC | PRN

## 2017-12-06 MED ORDER — FUROSEMIDE 10 MG/ML IJ SOLN
40.0000 mg | Freq: Once | INTRAMUSCULAR | Status: AC
  Administered 2017-12-06: 40 mg via INTRAVENOUS
  Filled 2017-12-06: qty 4

## 2017-12-06 MED ORDER — NITROFURANTOIN MONOHYD MACRO 100 MG PO CAPS
100.0000 mg | ORAL_CAPSULE | Freq: Every day | ORAL | Status: DC
  Administered 2017-12-06 – 2017-12-09 (×4): 100 mg via ORAL
  Filled 2017-12-06 (×4): qty 1

## 2017-12-06 MED ORDER — PANTOPRAZOLE SODIUM 40 MG PO TBEC
40.0000 mg | DELAYED_RELEASE_TABLET | Freq: Every day | ORAL | Status: DC
  Administered 2017-12-06 – 2017-12-10 (×5): 40 mg via ORAL
  Filled 2017-12-06 (×5): qty 1

## 2017-12-06 MED ORDER — IOPAMIDOL (ISOVUE-370) INJECTION 76%
100.0000 mL | Freq: Once | INTRAVENOUS | Status: AC | PRN
  Administered 2017-12-06: 100 mL via INTRAVENOUS

## 2017-12-06 MED ORDER — ASPIRIN-CAFFEINE 500-32.5 MG PO TABS: ORAL_TABLET | Freq: Four times a day (QID) | ORAL | Status: DC | PRN

## 2017-12-06 MED ORDER — SODIUM CHLORIDE 0.9% FLUSH: 3.0000 mL | INTRAVENOUS | Status: DC | PRN

## 2017-12-06 MED ORDER — INSULIN GLARGINE 100 UNIT/ML ~~LOC~~ SOLN
13.0000 [IU] | Freq: Once | SUBCUTANEOUS | Status: AC
  Administered 2017-12-06: 13 [IU] via SUBCUTANEOUS

## 2017-12-06 MED ORDER — ONDANSETRON HCL 4 MG/2ML IJ SOLN: 4.0000 mg | Freq: Four times a day (QID) | INTRAMUSCULAR | Status: DC | PRN

## 2017-12-06 MED ORDER — LEVALBUTEROL HCL 0.63 MG/3ML IN NEBU
0.6300 mg | INHALATION_SOLUTION | Freq: Three times a day (TID) | RESPIRATORY_TRACT | Status: AC
  Administered 2017-12-06 (×2): 0.63 mg via RESPIRATORY_TRACT
  Filled 2017-12-06 (×2): qty 3

## 2017-12-06 MED ORDER — INSULIN GLARGINE 100 UNIT/ML ~~LOC~~ SOLN
26.0000 [IU] | Freq: Every day | SUBCUTANEOUS | Status: DC
  Administered 2017-12-07 – 2017-12-09 (×3): 26 [IU] via SUBCUTANEOUS
  Filled 2017-12-06 (×5): qty 0.26

## 2017-12-06 MED ORDER — NITROGLYCERIN 0.4 MG SL SUBL
0.4000 mg | SUBLINGUAL_TABLET | SUBLINGUAL | Status: AC | PRN
  Administered 2017-12-06 (×3): 0.4 mg via SUBLINGUAL
  Filled 2017-12-06: qty 1

## 2017-12-06 MED ORDER — ALBUTEROL SULFATE (2.5 MG/3ML) 0.083% IN NEBU: 2.5000 mg | INHALATION_SOLUTION | Freq: Once | RESPIRATORY_TRACT | Status: DC

## 2017-12-06 MED ORDER — ROPINIROLE HCL 0.25 MG PO TABS
0.2500 mg | ORAL_TABLET | Freq: Two times a day (BID) | ORAL | Status: DC
  Administered 2017-12-06 – 2017-12-10 (×8): 0.25 mg via ORAL
  Filled 2017-12-06 (×10): qty 1

## 2017-12-06 MED ORDER — MELOXICAM 7.5 MG PO TABS
15.0000 mg | ORAL_TABLET | Freq: Every day | ORAL | Status: DC
  Administered 2017-12-06 – 2017-12-07 (×2): 15 mg via ORAL
  Filled 2017-12-06: qty 1
  Filled 2017-12-06: qty 2
  Filled 2017-12-06: qty 1
  Filled 2017-12-06: qty 2

## 2017-12-06 MED ORDER — CALCIUM CARBONATE-VITAMIN D 500-200 MG-UNIT PO TABS
2.0000 | ORAL_TABLET | ORAL | Status: DC
  Administered 2017-12-07 – 2017-12-10 (×3): 2 via ORAL
  Filled 2017-12-06 (×3): qty 2

## 2017-12-06 MED ORDER — KETOROLAC TROMETHAMINE 15 MG/ML IJ SOLN
15.0000 mg | Freq: Once | INTRAMUSCULAR | Status: AC
  Administered 2017-12-06: 15 mg via INTRAVENOUS
  Filled 2017-12-06: qty 1

## 2017-12-06 MED ORDER — DILTIAZEM HCL ER COATED BEADS 240 MG PO CP24
240.0000 mg | ORAL_CAPSULE | Freq: Two times a day (BID) | ORAL | Status: DC
  Administered 2017-12-06 – 2017-12-10 (×8): 240 mg via ORAL
  Filled 2017-12-06 (×7): qty 1

## 2017-12-06 MED ORDER — LEVALBUTEROL HCL 0.63 MG/3ML IN NEBU: 0.6300 mg | INHALATION_SOLUTION | Freq: Three times a day (TID) | RESPIRATORY_TRACT | Status: DC

## 2017-12-06 MED ORDER — ACETAMINOPHEN 325 MG PO TABS
650.0000 mg | ORAL_TABLET | Freq: Four times a day (QID) | ORAL | Status: DC | PRN
  Filled 2017-12-06: qty 2

## 2017-12-06 NOTE — Progress Notes (Signed)
CRITICAL VALUE ALERT  Critical Value:  0.03  Date & Time Notied:  11:17 PM   Provider Notified: BLOUNT,X   Orders Received/Actions taken: WAITING RESPONSE

## 2017-12-06 NOTE — Progress Notes (Signed)
At approximately 1630 , post nebulizer treatment, patient stated she was still having chest pain of an "8" with deep inhalations. RN was notified. IS instruction deferred until patient's pain is lessened.Marland Kitchen

## 2017-12-06 NOTE — ED Provider Notes (Signed)
[facility] EMERGENCY DEPARTMENT Provider Note   CSN: [id number] Arrival date & time: 12/06/17  7169     History   Chief Complaint Chief Complaint  Patient presents with  . Shortness of Breath    HPI [name] is a 84 y.o. female.  HPI  Pt was seen at 0915. Per pt, c/o sudden onset and persistence of constant anterior chest "pain" that woke her from sleep approximately 0530 this morning. Describes the CP as "tightness." Has been associated with SOB. Pt states her symptoms worsen with ambulation. Denies palpitations, no cough, no back pain, no abd pain, no N/V/D, no fevers, no rash, no injury.    Past Medical History:  Diagnosis Date  . Arthritis   . Cancer ([place])    breast  . Cataract   . Diabetes mellitus without complication ([place])   . Frequent UTI   . Hyperlipidemia   . Hypertension   . Neck pain   . Scoliosis   . Stroke [facility])     Patient Active Problem List   Diagnosis Date Noted  . Trochanteric bursitis, right hip 07/16/2017  . Other intervertebral disc degeneration, lumbar region 07/16/2017  . Osteoporosis 11/04/2016  . Diabetic polyneuropathy associated with type 2 diabetes mellitus ([place]) 11/14/2014  . Neck pain   . Fibromyalgia 02/11/2013  . CVA 08/01/2008  . Osteoarthritis 04/09/2007  . Hyperlipemia 03/08/2007  . Essential hypertension 03/08/2007  . PULMONARY FIBROSIS, POSTINFLAMMATORY 03/08/2007  . GERD 03/08/2007  . BREAST CANCER, HX OF 03/08/2007    Past Surgical History:  Procedure Laterality Date  . APPENDECTOMY    . BREAST LUMPECTOMY Left   . CHOLECYSTECTOMY    . EYE SURGERY    . JOINT REPLACEMENT     bilat knee   . REPLACEMENT TOTAL KNEE BILATERAL Bilateral   . TONSILLECTOMY       OB History   None      Home Medications    Prior to Admission medications   Medication Sig Start Date End Date Taking? Authorizing Provider  Aspirin-Caffeine ([name] BACK & BODY PAIN EX ST PO) Take 2 tablets by mouth 4 (four) times daily as  needed (pain).     [provider]  Calcium Carbonate-Vit D-Min (CALCIUM 1200 PO) Take 1 tablet by mouth every other day.     [provider]  Cholecalciferol (VITAMIN D3) 5000 units CAPS Take 2,000 Units by mouth daily.     [provider]  Cyanocobalamin (VITAMIN B 12 PO) Take 1 tablet by mouth daily.    [provider]  diltiazem (DILTIAZEM CD) 240 MG 24 hr capsule Take 1 capsule (240 mg total) by mouth 2 (two) times daily. 10/30/17   Claretta Fraise, MD  gabapentin (NEURONTIN) 100 MG capsule Take 3 capsules (300 mg total) by mouth 2 (two) times daily. 10/30/17   Claretta Fraise, MD  glucose blood (ONE TOUCH ULTRA TEST) test strip USE TO check blood sugars twice daily 10/30/17   Claretta Fraise, MD  Insulin Glargine (LANTUS SOLOSTAR) 100 UNIT/ML Solostar Pen INJECT UP TO 26 UNITS ONCE DAILY AT 10PM 10/30/17   Claretta Fraise, MD  Insulin Pen Needle 31G X 8 MM MISC Use once daily with lantus solostar 10/30/17   Claretta Fraise, MD  Magnesium 250 MG TABS Take 2 tablets (500 mg total) by mouth 2 (two) times daily. 03/31/16   Claretta Fraise, MD  meloxicam (MOBIC) 15 MG tablet Take 1 tablet (15 mg total) by mouth daily. 08/17/17   Stacks,  Cletus Gash, MD  metFORMIN (GLUCOPHAGE) 500 MG tablet Take 1 Tablet by mouth once daily with BREAKFAST 10/30/17   Claretta Fraise, MD  Multiple Vitamins-Minerals (PRESERVISION AREDS PO) Take by mouth.    [provider]  nitrofurantoin, macrocrystal-monohydrate, (MACROBID) 100 MG capsule Take 1 capsule (100 mg total) by mouth at bedtime. 10/15/17   Claretta Fraise, MD  omeprazole (PRILOSEC) 20 MG capsule Take 1 Capsule by mouth 2 times a day BEFORE meals 10/30/17   Claretta Fraise, MD  rOPINIRole (REQUIP) 0.25 MG tablet take 1  Tablet by mouth 3 times daily for leg cramps 10/30/17   Claretta Fraise, MD  triamcinolone (KENALOG) 0.025 % ointment Apply 1 application topically 2 (two) times daily. 11/03/17   Claretta Fraise, MD    Family  History Family History  Problem Relation Age of Onset  . Cancer Mother        lung  . Arthritis Mother   . Heart disease Father        endocarditis  . Cancer Brother        lung, throat  . Alcohol abuse Brother     Social History Social History   Tobacco Use  . Smoking status: Never Smoker  . Smokeless tobacco: Never Used  Substance Use Topics  . Alcohol use: No    Alcohol/week: 0.0 oz  . Drug use: No     Allergies   Diltiazem hcl and Ace inhibitors   Review of Systems Review of Systems ROS: Statement: All systems negative except as marked or noted in the HPI; Constitutional: Negative for fever and chills. ; ; Eyes: Negative for eye pain, redness and discharge. ; ; ENMT: Negative for ear pain, hoarseness, nasal congestion, sinus pressure and sore throat. ; ; Cardiovascular: +CP, SOB. Negative for palpitations, diaphoresis and peripheral edema. ; ; Respiratory: Negative for cough, wheezing and stridor. ; ; Gastrointestinal: Negative for nausea, vomiting, diarrhea, abdominal pain, blood in stool, hematemesis, jaundice and rectal bleeding. . ; ; Genitourinary: Negative for dysuria, flank pain and hematuria. ; ; Musculoskeletal: Negative for back pain and neck pain. Negative for swelling and trauma.; ; Skin: Negative for pruritus, rash, abrasions, blisters, bruising and skin lesion.; ; Neuro: Negative for headache, lightheadedness and neck stiffness. Negative for weakness, altered level of consciousness, altered mental status, extremity weakness, paresthesias, involuntary movement, seizure and syncope.       Physical Exam Updated Vital Signs BP (!) 146/56   Pulse 68   Temp 99 F (37.2 C) (Oral)   Resp 16   Ht 4\' 10"  (1.473 m)   Wt 72.6 kg (160 lb)   SpO2 100%   BMI 33.44 kg/m    Patient Vitals for the past 24 hrs:  BP Temp Temp src Pulse Resp SpO2 Height Weight  12/06/17 1130 135/82 - - 80 (!) 22 93 % - -  12/06/17 1030 (!) 108/50 - - 76 20 93 % - -  12/06/17 1000  (!) 109/53 - - 69 20 94 % - -  12/06/17 0949 129/68 - - 83 15 94 % - -  12/06/17 0943 (!) 123/59 - - 89 19 96 % - -  12/06/17 0930 (!) 153/73 - - 65 18 96 % - -  12/06/17 0908 (!) 146/56 99 F (37.2 C) Oral 68 16 100 % - -  12/06/17 0904 - - - - - - 4\' 10"  (1.473 m) 72.6 kg (160 lb)     Physical Exam 0920: Physical examination:  Nursing notes reviewed; Vital  signs and O2 SAT reviewed;  Constitutional: Well developed, Well nourished, Well hydrated, In no acute distress; Head:  Normocephalic, atraumatic; Eyes: EOMI, PERRL, No scleral icterus; ENMT: Mouth and pharynx normal, Mucous membranes moist; Neck: Supple, Full range of motion, No lymphadenopathy; Cardiovascular: Regular rate and rhythm, No gallop; Respiratory: Breath sounds clear & equal bilaterally, No wheezes.  Speaking full sentences with ease, Normal respiratory effort/excursion; Chest: Nontender, Movement normal; Abdomen: Soft, Nontender, Nondistended, Normal bowel sounds; Genitourinary: No CVA tenderness; Extremities: Peripheral pulses normal, No tenderness, +tr pedal edema bilat. No calf asymmetry.; Neuro: AA&Ox3, Major CN grossly intact.  Speech clear. No gross focal motor or sensory deficits in extremities.; Skin: Color normal, Warm, Dry.   ED Treatments / Results  Labs (all labs ordered are listed, but only abnormal results are displayed)   EKG EKG Interpretation  Date/Time:  Sunday December 06 2017 09:08:00 EDT Ventricular Rate:  73 PR Interval:    QRS Duration: 73 QT Interval:  481 QTC Calculation: 531 R Axis:   35 Text Interpretation:  Unknown rhythm, irregular rate Low voltage, extremity leads Prolonged QT interval When compared with ECG of 11/25/2005 irregular rhythm is now present QT has lengthened Confirmed by Francine Graven [phone]) on 12/06/2017 9:27:00 AM    EKG Interpretation  Date/Time:  Sunday December 06 2017 10:27:07 EDT Ventricular Rate:  77 PR Interval:    QRS Duration: 90 QT Interval:  486 QTC  Calculation: 551 R Axis:   46 Text Interpretation:  Atrial fibrillation Low voltage, extremity and precordial leads Prolonged QT interval Since last tracing of earlier today irregular rhythm is Atrial fibrillation Confirmed by Francine Graven [phone]) on 12/06/2017 10:30:54 AM           Radiology   Procedures Procedures (including critical care time)  Medications Ordered in ED Medications  aspirin chewable tablet 324 mg (has no administration in time range)  nitroGLYCERIN (NITROSTAT) SL tablet 0.4 mg (has no administration in time range)     Initial Impression / Assessment and Plan / ED Course  I have reviewed the triage vital signs and the nursing notes.  Pertinent labs & imaging results that were available during my care of the patient were reviewed by me and considered in my medical decision making (see chart for details).  MDM Reviewed: previous chart, nursing note and vitals Reviewed previous: labs and ECG Interpretation: labs, ECG and x-ray   Results for orders placed or performed during the hospital encounter of 89/38/10  Basic metabolic panel  Result Value Ref Range   Sodium 139 135 - 145 mmol/L   Potassium 3.7 3.5 - 5.1 mmol/L   Chloride 103 98 - 111 mmol/L   CO2 28 22 - 32 mmol/L   Glucose, Bld 92 70 - 99 mg/dL   BUN 19 8 - 23 mg/dL   Creatinine, Ser 0.70 0.44 - 1.00 mg/dL   Calcium 8.9 8.9 - 10.3 mg/dL   GFR calc non Af Amer >60 >60 mL/min   GFR calc Af Amer >60 >60 mL/min   Anion gap 8 5 - 15  Brain natriuretic peptide  Result Value Ref Range   B Natriuretic Peptide 206.0 (H) 0.0 - 100.0 pg/mL  Troponin I  Result Value Ref Range   Troponin I <0.03 <0.03 ng/mL  CBC with Differential  Result Value Ref Range   WBC 14.8 (H) 4.0 - 10.5 K/uL   RBC 4.96 3.87 - 5.11 MIL/uL   Hemoglobin 12.5 12.0 - 15.0 g/dL   HCT 40.4 36.0 -  46.0 %   MCV 81.5 78.0 - 100.0 fL   MCH 25.2 (L) 26.0 - 34.0 pg   MCHC 30.9 30.0 - 36.0 g/dL   RDW 17.3 (H) 11.5 - 15.5 %    Platelets 255 150 - 400 K/uL   Neutrophils Relative % 88 %   Neutro Abs 13.0 (H) 1.7 - 7.7 K/uL   Lymphocytes Relative 3 %   Lymphs Abs 0.5 (L) 0.7 - 4.0 K/uL   Monocytes Relative 8 %   Monocytes Absolute 1.2 (H) 0.1 - 1.0 K/uL   Eosinophils Relative 1 %   Eosinophils Absolute 0.1 0.0 - 0.7 K/uL   Basophils Relative 0 %   Basophils Absolute 0.0 0.0 - 0.1 K/uL  D-dimer, quantitative  Result Value Ref Range   D-Dimer, Quant 1.69 (H) 0.00 - 0.50 ug/mL-FEU  Magnesium  Result Value Ref Range   Magnesium 2.0 1.7 - 2.4 mg/dL    Ct Angio Chest Pe W/cm &/or Wo Cm Result Date: 12/06/2017 CLINICAL DATA:  Sudden shortness of breath for several hours EXAM: CT ANGIOGRAPHY CHEST WITH CONTRAST TECHNIQUE: Multidetector CT imaging of the chest was performed using the standard protocol during bolus administration of intravenous contrast. Multiplanar CT image reconstructions and MIPs were obtained to evaluate the vascular anatomy. CONTRAST:  164mL ISOVUE-370 IOPAMIDOL (ISOVUE-370) INJECTION 76% COMPARISON:  None. FINDINGS: Cardiovascular: Thoracic aorta and its branches demonstrate atherosclerotic calcifications without aneurysmal dilatation or dissection. Mild coronary calcifications are seen. No cardiac enlargement is noted. Pulmonary artery is well visualized within normal branching pattern. No filling defect to suggest pulmonary embolism is seen. Mediastinum/Nodes: The thoracic inlet is within normal limits. No significant hilar or mediastinal adenopathy is noted. The esophagus is within normal limits. Lungs/Pleura: Mild dependent atelectatic changes are noted. Mild emphysematous changes are noted. No focal confluent infiltrate or sizable effusion is seen. No parenchymal nodules are noted. Mild peribronchial thickening is noted. Upper Abdomen: Visualized upper abdomen is unremarkable. Musculoskeletal: Degenerative changes of the thoracic spine are noted. Degenerative changes of the shoulder joints are noted  bilaterally. Review of the MIP images confirms the above findings. IMPRESSION: No evidence of pulmonary emboli. Mild dependent atelectatic changes and mild peribronchial thickening. No other focal abnormality is noted. Aortic Atherosclerosis (ICD10-I70.0) and Emphysema (ICD10-J43.9). Electronically Signed   By: Inez Catalina M.D.   On: 12/06/2017 11:19   Dg Chest Port 1 View Result Date: 12/06/2017 CLINICAL DATA:  Shortness of breath and chest pain EXAM: PORTABLE CHEST 1 VIEW COMPARISON:  03/09/2017 FINDINGS: Cardiac shadow is stable. Vascular congestion is noted with mild interstitial edema. Bibasilar atelectatic changes as well as a small left pleural effusion are seen. No bony abnormality is noted. IMPRESSION: Changes of mild CHF. Electronically Signed   By: Inez Catalina M.D.   On: 12/06/2017 09:48    1235:  Mild BNP elevation with CHF changes on CXR. CT-A chest reassuring. No change in symptoms after ASA, SL ntg. Pt with remote hx of pulmonary fibrosis, denies new pulmonary dx (ie: asthma, COPD).  Pt's EKG with new afib, rate controlled and pt denies palpitations. Pt ambulated with Sats dropping to 87% R/A and pt c/o increasing SOB. Pt does not have home O2. Will dose short neb. Dx and testing d/w pt and family.  Questions answered.  Verb understanding, agreeable to admit. T/C returned from [facility] Dr. Manuella Ghazi, case discussed, including:  HPI, pertinent PM/SHx, VS/PE, dx testing, ED course and treatment:  Agreeable to admit.     Final Clinical Impressions(s) / ED  Diagnoses   Final diagnoses:  None    ED Discharge Orders    None       Francine Graven, DO 12/08/17 1558

## 2017-12-06 NOTE — H&P (Signed)
History and Physical    [name] QJJ:[id number] DOB: 28-Jan-1926 DOA: 12/06/2017  PCP: Claretta Fraise, MD   Patient coming from: Home  Chief Complaint: Dyspnea  HPI: [name] is a 84 y.o. female with medical history significant for type 2 diabetes, dyslipidemia, hypertension, GERD, and neuropathy who presented to the emergency department with sudden onset chest tightness and shortness of breath that began at about 530 this morning and woke her from sleep.  Her symptoms apparently worsened with ambulation and she denied any other symptoms.  She continues to take her home medications as previously prescribed and struggles with lower extremity edema which is chronic for her.  She has no prior history of heart failure or any arrhythmias.  Notably, it appears that patient may have had pulmonary fibrosis in the past and was on oxygen at one-point, but is not currently on any oxygen. She denies any fever, chills, cough, palpitations, abdominal pain, nausea, vomiting, diarrhea, or other injuries.  She denies any active chest pain currently.   ED Course: Vital signs are stable and patient has had significant hypoxemia with ambulation.  She is currently requiring 2 L nasal cannula.  Laboratory data with some minimal leukocytosis of 14,000.  CT angiogram of the chest with no signs of PE and chest x-ray demonstrates some mild CHF.  BNP slightly greater than 200.  Troponin within normal limits.  EKG demonstrating new onset atrial fibrillation with rate of 77 bpm.  She is due to receive some breathing treatments in the ED.  Review of Systems: All others reviewed and otherwise negative.  Past Medical History:  Diagnosis Date  . Arthritis   . Cancer ([place])    breast  . Cataract   . Diabetes mellitus without complication ([place])   . Frequent UTI   . Hyperlipidemia   . Hypertension   . Neck pain   . Scoliosis   . Stroke [facility])     Past Surgical History:  Procedure Laterality Date  .  APPENDECTOMY    . BREAST LUMPECTOMY Left   . CHOLECYSTECTOMY    . EYE SURGERY    . JOINT REPLACEMENT     bilat knee   . REPLACEMENT TOTAL KNEE BILATERAL Bilateral   . TONSILLECTOMY       reports that she has never smoked. She has never used smokeless tobacco. She reports that she does not drink alcohol or use drugs.  Allergies  Allergen Reactions  . Diltiazem Hcl     Red itchy rash started a few hours after taking short acting 120mg  dilt tabs  . Ace Inhibitors Cough    Family History  Problem Relation Age of Onset  . Cancer Mother        lung  . Arthritis Mother   . Heart disease Father        endocarditis  . Cancer Brother        lung, throat  . Alcohol abuse Brother     Prior to Admission medications   Medication Sig Start Date End Date Taking? Authorizing Provider  Aspirin-Caffeine (BAYER BACK & BODY PAIN EX ST PO) Take 2 tablets by mouth 4 (four) times daily as needed (pain).    Yes [provider]  Calcium Carbonate-Vit D-Min (CALCIUM 1200 PO) Take 1 tablet by mouth every other day.    Yes [provider]  Cholecalciferol (VITAMIN D3) 5000 units CAPS Take 2,000 Units by mouth daily.    Yes [provider]  Cyanocobalamin (VITAMIN B  12 PO) Take 1 tablet by mouth daily.   Yes [provider]  diltiazem (DILTIAZEM CD) 240 MG 24 hr capsule Take 1 capsule (240 mg total) by mouth 2 (two) times daily. 10/30/17  Yes Claretta Fraise, MD  gabapentin (NEURONTIN) 100 MG capsule Take 3 capsules (300 mg total) by mouth 2 (two) times daily. 10/30/17  Yes Stacks, Cletus Gash, MD  glucose blood (ONE TOUCH ULTRA TEST) test strip USE TO check blood sugars twice daily 10/30/17  Yes Stacks, Cletus Gash, MD  Insulin Glargine (LANTUS SOLOSTAR) 100 UNIT/ML Solostar Pen INJECT UP TO 26 UNITS ONCE DAILY AT 10PM 10/30/17  Yes Stacks, Cletus Gash, MD  Insulin Pen Needle 31G X 8 MM MISC Use once daily with lantus solostar 10/30/17   Claretta Fraise, MD  meloxicam (MOBIC) 15 MG tablet  Take 1 tablet (15 mg total) by mouth daily. 08/17/17   Claretta Fraise, MD  metFORMIN (GLUCOPHAGE) 500 MG tablet Take 1 Tablet by mouth once daily with BREAKFAST 10/30/17   Claretta Fraise, MD  Multiple Vitamins-Minerals (PRESERVISION AREDS PO) Take by mouth.    [provider]  nitrofurantoin, macrocrystal-monohydrate, (MACROBID) 100 MG capsule Take 1 capsule (100 mg total) by mouth at bedtime. 10/15/17   Claretta Fraise, MD  omeprazole (PRILOSEC) 20 MG capsule Take 1 Capsule by mouth 2 times a day BEFORE meals 10/30/17   Claretta Fraise, MD  rOPINIRole (REQUIP) 0.25 MG tablet take 1  Tablet by mouth 3 times daily for leg cramps Patient taking differently: Take 0.25 mg by mouth 2 (two) times daily. take 1  Tablet by mouth 3 times daily for leg cramps 10/30/17   Claretta Fraise, MD  triamcinolone (KENALOG) 0.025 % ointment Apply 1 application topically 2 (two) times daily. Patient taking differently: Apply 1 application topically daily.  11/03/17   Claretta Fraise, MD    Physical Exam: Vitals:   12/06/17 1000 12/06/17 1030 12/06/17 1130 12/06/17 1256  BP: (!) 109/53 (!) 108/50 135/82   Pulse: 69 76 80   Resp: 20 20 (!) 22   Temp:      TempSrc:      SpO2: 94% 93% 93% 96%  Weight:      Height:        Constitutional: NAD, calm, comfortable; pleasant Vitals:   12/06/17 1000 12/06/17 1030 12/06/17 1130 12/06/17 1256  BP: (!) 109/53 (!) 108/50 135/82   Pulse: 69 76 80   Resp: 20 20 (!) 22   Temp:      TempSrc:      SpO2: 94% 93% 93% 96%  Weight:      Height:       Eyes: lids and conjunctivae normal ENMT: Mucous membranes are moist.  Neck: normal, supple Respiratory: clear to auscultation bilaterally. Normal respiratory effort. No accessory muscle use. On 2L [place] Cardiovascular: Regular rate and rhythm, no murmurs. 1+ bilateral LE edema. Abdomen: no tenderness, no distention. Bowel sounds positive.  Musculoskeletal:  No joint deformity upper and lower extremities.   Skin: no rashes,  lesions, ulcers.  Psychiatric: Normal judgment and insight. Alert and oriented x 3. Normal mood.   Labs on Admission: I have personally reviewed following labs and imaging studies  CBC: Recent Labs  Lab 12/06/17 0926  WBC 14.8*  NEUTROABS 13.0*  HGB 12.5  HCT 40.4  MCV 81.5  PLT 381   Basic Metabolic Panel: Recent Labs  Lab 12/06/17 0926  NA 139  K 3.7  CL 103  CO2 28  GLUCOSE 92  BUN 19  CREATININE 0.70  CALCIUM 8.9  MG 2.0   GFR: Estimated Creatinine Clearance: 40.3 mL/min (by C-G formula based on SCr of 0.7 mg/dL). Liver Function Tests: No results for input(s): AST, ALT, ALKPHOS, BILITOT, PROT, ALBUMIN in the last 168 hours. No results for input(s): LIPASE, AMYLASE in the last 168 hours. No results for input(s): AMMONIA in the last 168 hours. Coagulation Profile: No results for input(s): INR, PROTIME in the last 168 hours. Cardiac Enzymes: Recent Labs  Lab 12/06/17 0926  TROPONINI <0.03   BNP (last 3 results) No results for input(s): PROBNP in the last 8760 hours. HbA1C: No results for input(s): HGBA1C in the last 72 hours. CBG: No results for input(s): GLUCAP in the last 168 hours. Lipid Profile: No results for input(s): CHOL, HDL, LDLCALC, TRIG, CHOLHDL, LDLDIRECT in the last 72 hours. Thyroid Function Tests: No results for input(s): TSH, T4TOTAL, FREET4, T3FREE, THYROIDAB in the last 72 hours. Anemia Panel: No results for input(s): VITAMINB12, FOLATE, FERRITIN, TIBC, IRON, RETICCTPCT in the last 72 hours. Urine analysis:    Component Value Date/Time   COLORURINE YELLOW 05/14/2007 1320   APPEARANCEUR Clear 07/31/2017 1641   LABSPEC 1.033 (H) 05/14/2007 1320   PHURINE 6.0 05/14/2007 1320   GLUCOSEU Negative 07/31/2017 1641   HGBUR NEGATIVE 05/14/2007 1320   BILIRUBINUR Negative 07/31/2017 1641   KETONESUR NEGATIVE 05/14/2007 1320   PROTEINUR Negative 07/31/2017 1641   PROTEINUR NEGATIVE 05/14/2007 1320   UROBILINOGEN negative 07/03/2015 1351    UROBILINOGEN 1.0 05/14/2007 1320   NITRITE Negative 07/31/2017 1641   NITRITE NEGATIVE 05/14/2007 1320   LEUKOCYTESUR Negative 07/31/2017 1641    Radiological Exams on Admission: Ct Angio Chest Pe W/cm &/or Wo Cm  Result Date: 12/06/2017 CLINICAL DATA:  Sudden shortness of breath for several hours EXAM: CT ANGIOGRAPHY CHEST WITH CONTRAST TECHNIQUE: Multidetector CT imaging of the chest was performed using the standard protocol during bolus administration of intravenous contrast. Multiplanar CT image reconstructions and MIPs were obtained to evaluate the vascular anatomy. CONTRAST:  128mL ISOVUE-370 IOPAMIDOL (ISOVUE-370) INJECTION 76% COMPARISON:  None. FINDINGS: Cardiovascular: Thoracic aorta and its branches demonstrate atherosclerotic calcifications without aneurysmal dilatation or dissection. Mild coronary calcifications are seen. No cardiac enlargement is noted. Pulmonary artery is well visualized within normal branching pattern. No filling defect to suggest pulmonary embolism is seen. Mediastinum/Nodes: The thoracic inlet is within normal limits. No significant hilar or mediastinal adenopathy is noted. The esophagus is within normal limits. Lungs/Pleura: Mild dependent atelectatic changes are noted. Mild emphysematous changes are noted. No focal confluent infiltrate or sizable effusion is seen. No parenchymal nodules are noted. Mild peribronchial thickening is noted. Upper Abdomen: Visualized upper abdomen is unremarkable. Musculoskeletal: Degenerative changes of the thoracic spine are noted. Degenerative changes of the shoulder joints are noted bilaterally. Review of the MIP images confirms the above findings. IMPRESSION: No evidence of pulmonary emboli. Mild dependent atelectatic changes and mild peribronchial thickening. No other focal abnormality is noted. Aortic Atherosclerosis (ICD10-I70.0) and Emphysema (ICD10-J43.9). Electronically Signed   By: Inez Catalina M.D.   On: 12/06/2017 11:19   Dg  Chest Port 1 View  Result Date: 12/06/2017 CLINICAL DATA:  Shortness of breath and chest pain EXAM: PORTABLE CHEST 1 VIEW COMPARISON:  03/09/2017 FINDINGS: Cardiac shadow is stable. Vascular congestion is noted with mild interstitial edema. Bibasilar atelectatic changes as well as a small left pleural effusion are seen. No bony abnormality is noted. IMPRESSION: Changes of mild CHF. Electronically Signed   By: Linus Mako.D.  On: 12/06/2017 09:48    EKG: Independently reviewed. Atrial fibrillation with HR 77.  Assessment/Plan Principal Problem:   Acute hypoxemic respiratory failure (HCC) Active Problems:   Hyperlipemia   Essential hypertension   GERD   Diabetic polyneuropathy associated with type 2 diabetes mellitus ([place])   New onset atrial fibrillation ([place])    1. Acute hypoxemic respiratory failure.  This appears to be multifactorial with component of likely progression of pulmonary fibrosis as well as components of CHF on physical examination and also with orthopnea and PND.  BNP is elevated at 206 with chest x-ray suggesting some mild congestion.  Will order 2D echocardiogram as prior was in 2007.  Strict I's and O's and daily weights with Lasix 40 mg IV x1.  Will possibly require home oxygen on discharge.  Maintain on breathing treatments with Xopenex/ipratropium every 6 hours x4 doses.  Incentive spirometry and up to chair.  Avoid steroids for now. 2. New onset atrial fibrillation.  She is currently rate controlled and appears to be on Cardizem which I will continue.  Monitor on telemetry and initiate Eliquis for anticoagulation with noted CHADs of 5.  CT chest angiogram negative for pulmonary embolus.  Will further assess with 2D echocardiogram and TSH. 3. Type 2 diabetes.  Continue home insulin and hold metformin due to recent contrast study.  Maintain on sliding scale insulin.  Carb modified diet. 4. Hypertension.  Maintain on Cardizem CD 240 mg twice daily.  Good rate control  currently noted. 5. GERD.  Maintain on PPI. 6. Diabetic polyneuropathy.  Maintain on Requip gabapentin.   DVT prophylaxis: Eliquis Code Status: DNR Family Communication: None at bedside Disposition Plan:Evaluation of hypoxemia with 2D echo; diuresis/breathing tx Consults called:None Admission status: Obs, Tele   Florrie Ramires Darleen Crocker DO [facility] Pager [phone]  If 7PM-7AM, please contact night-coverage [email or web address] Password [email or web address]  12/06/2017, 1:02 PM

## 2017-12-06 NOTE — ED Triage Notes (Signed)
Pt reports sudden onset shortness of breath at 0530 this morning.  Very sob with ambulation.

## 2017-12-07 ENCOUNTER — Inpatient Hospital Stay (HOSPITAL_COMMUNITY): Payer: PPO

## 2017-12-07 DIAGNOSIS — I110 Hypertensive heart disease with heart failure: Secondary | ICD-10-CM | POA: Diagnosis present

## 2017-12-07 DIAGNOSIS — I4891 Unspecified atrial fibrillation: Secondary | ICD-10-CM | POA: Diagnosis present

## 2017-12-07 DIAGNOSIS — Z794 Long term (current) use of insulin: Secondary | ICD-10-CM | POA: Diagnosis not present

## 2017-12-07 DIAGNOSIS — Z888 Allergy status to other drugs, medicaments and biological substances status: Secondary | ICD-10-CM | POA: Diagnosis not present

## 2017-12-07 DIAGNOSIS — Z96653 Presence of artificial knee joint, bilateral: Secondary | ICD-10-CM | POA: Diagnosis present

## 2017-12-07 DIAGNOSIS — T380X5A Adverse effect of glucocorticoids and synthetic analogues, initial encounter: Secondary | ICD-10-CM | POA: Diagnosis present

## 2017-12-07 DIAGNOSIS — K219 Gastro-esophageal reflux disease without esophagitis: Secondary | ICD-10-CM | POA: Diagnosis present

## 2017-12-07 DIAGNOSIS — I340 Nonrheumatic mitral (valve) insufficiency: Secondary | ICD-10-CM

## 2017-12-07 DIAGNOSIS — E1165 Type 2 diabetes mellitus with hyperglycemia: Secondary | ICD-10-CM | POA: Diagnosis present

## 2017-12-07 DIAGNOSIS — E785 Hyperlipidemia, unspecified: Secondary | ICD-10-CM | POA: Diagnosis present

## 2017-12-07 DIAGNOSIS — I10 Essential (primary) hypertension: Secondary | ICD-10-CM | POA: Diagnosis not present

## 2017-12-07 DIAGNOSIS — I5033 Acute on chronic diastolic (congestive) heart failure: Secondary | ICD-10-CM | POA: Diagnosis present

## 2017-12-07 DIAGNOSIS — J8410 Pulmonary fibrosis, unspecified: Secondary | ICD-10-CM | POA: Diagnosis present

## 2017-12-07 DIAGNOSIS — R0602 Shortness of breath: Secondary | ICD-10-CM | POA: Diagnosis present

## 2017-12-07 DIAGNOSIS — Z8673 Personal history of transient ischemic attack (TIA), and cerebral infarction without residual deficits: Secondary | ICD-10-CM | POA: Diagnosis not present

## 2017-12-07 DIAGNOSIS — J9601 Acute respiratory failure with hypoxia: Secondary | ICD-10-CM | POA: Diagnosis present

## 2017-12-07 DIAGNOSIS — Z66 Do not resuscitate: Secondary | ICD-10-CM | POA: Diagnosis present

## 2017-12-07 DIAGNOSIS — Z791 Long term (current) use of non-steroidal anti-inflammatories (NSAID): Secondary | ICD-10-CM | POA: Diagnosis not present

## 2017-12-07 DIAGNOSIS — R0902 Hypoxemia: Secondary | ICD-10-CM | POA: Diagnosis not present

## 2017-12-07 DIAGNOSIS — Z79899 Other long term (current) drug therapy: Secondary | ICD-10-CM | POA: Diagnosis not present

## 2017-12-07 DIAGNOSIS — E1142 Type 2 diabetes mellitus with diabetic polyneuropathy: Secondary | ICD-10-CM | POA: Diagnosis present

## 2017-12-07 DIAGNOSIS — E782 Mixed hyperlipidemia: Secondary | ICD-10-CM | POA: Diagnosis not present

## 2017-12-07 LAB — GLUCOSE, CAPILLARY
GLUCOSE-CAPILLARY: 95 mg/dL (ref 70–99)
GLUCOSE-CAPILLARY: 173 mg/dL — AB (ref 70–99)
GLUCOSE-CAPILLARY: 290 mg/dL — AB (ref 70–99)
Glucose-Capillary: 71 mg/dL (ref 70–99)

## 2017-12-07 LAB — BASIC METABOLIC PANEL
Anion gap: 6 (ref 5–15)
BUN: 14 mg/dL (ref 8–23)
CALCIUM: 8.3 mg/dL — AB (ref 8.9–10.3)
CHLORIDE: 105 mmol/L (ref 98–111)
CO2: 28 mmol/L (ref 22–32)
CREATININE: 0.65 mg/dL (ref 0.44–1.00)
GFR calc Af Amer: >60 mL/min (ref >60)
GFR calc non Af Amer: >60 mL/min (ref >60)
Glucose, Bld: 81 mg/dL (ref 70–99)
Potassium: 3.3 mmol/L — ABNORMAL LOW (ref 3.5–5.1)
SODIUM: 139 mmol/L (ref 135–145)

## 2017-12-07 LAB — CBC
HCT: 34.7 % — ABNORMAL LOW (ref 36.0–46.0)
Hemoglobin: 10.8 g/dL — ABNORMAL LOW (ref 12.0–15.0)
MCH: 25.6 pg — ABNORMAL LOW (ref 26.0–34.0)
MCHC: 31.1 g/dL (ref 30.0–36.0)
MCV: 82.2 fL (ref 78.0–100.0)
Platelets: 211 10*3/uL (ref 150–400)
RBC: 4.22 MIL/uL (ref 3.87–5.11)
RDW: 17.9 % — AB (ref 11.5–15.5)
WBC: 9.4 10*3/uL (ref 4.0–10.5)

## 2017-12-07 LAB — TROPONIN I: Troponin I: <0.03 ng/mL (ref <0.03)

## 2017-12-07 LAB — ECHOCARDIOGRAM COMPLETE
Height: 58 in
WEIGHTICAEL: 2592 [oz_av]

## 2017-12-07 MED ORDER — MELOXICAM 7.5 MG PO TABS
7.5000 mg | ORAL_TABLET | Freq: Every day | ORAL | Status: DC
  Administered 2017-12-08 – 2017-12-09 (×2): 7.5 mg via ORAL
  Filled 2017-12-07 (×2): qty 1

## 2017-12-07 MED ORDER — METHYLPREDNISOLONE SODIUM SUCC 125 MG IJ SOLR
60.0000 mg | Freq: Two times a day (BID) | INTRAMUSCULAR | Status: DC
  Administered 2017-12-07 – 2017-12-08 (×2): 60 mg via INTRAVENOUS
  Filled 2017-12-07 (×2): qty 2

## 2017-12-07 MED ORDER — GI COCKTAIL ~~LOC~~
30.0000 mL | Freq: Once | ORAL | Status: DC
  Filled 2017-12-07 (×2): qty 30

## 2017-12-07 MED ORDER — APIXABAN 5 MG PO TABS
5.0000 mg | ORAL_TABLET | Freq: Two times a day (BID) | ORAL | Status: DC
  Administered 2017-12-07 – 2017-12-10 (×7): 5 mg via ORAL
  Filled 2017-12-07 (×7): qty 1

## 2017-12-07 MED ORDER — POTASSIUM CHLORIDE CRYS ER 20 MEQ PO TBCR
40.0000 meq | EXTENDED_RELEASE_TABLET | Freq: Once | ORAL | Status: AC
  Administered 2017-12-07: 40 meq via ORAL
  Filled 2017-12-07: qty 2

## 2017-12-07 MED ORDER — IPRATROPIUM BROMIDE 0.02 % IN SOLN
0.5000 mg | Freq: Once | RESPIRATORY_TRACT | Status: AC
  Administered 2017-12-07: 0.5 mg via RESPIRATORY_TRACT
  Filled 2017-12-07: qty 2.5

## 2017-12-07 MED ORDER — FUROSEMIDE 10 MG/ML IJ SOLN
40.0000 mg | Freq: Once | INTRAMUSCULAR | Status: AC
  Administered 2017-12-07: 40 mg via INTRAVENOUS
  Filled 2017-12-07: qty 4

## 2017-12-07 MED ORDER — POTASSIUM CHLORIDE CRYS ER 20 MEQ PO TBCR
20.0000 meq | EXTENDED_RELEASE_TABLET | Freq: Two times a day (BID) | ORAL | Status: DC
  Administered 2017-12-07 – 2017-12-08 (×3): 20 meq via ORAL
  Filled 2017-12-07 (×3): qty 1

## 2017-12-07 MED ORDER — LEVALBUTEROL HCL 0.63 MG/3ML IN NEBU
0.6300 mg | INHALATION_SOLUTION | Freq: Once | RESPIRATORY_TRACT | Status: AC
  Administered 2017-12-07: 0.63 mg via RESPIRATORY_TRACT
  Filled 2017-12-07: qty 3

## 2017-12-07 NOTE — Progress Notes (Signed)
*  PRELIMINARY RESULTS* Echocardiogram 2D Echocardiogram has been performed.  [name] 12/07/2017, 4:08 PM

## 2017-12-07 NOTE — Progress Notes (Signed)
SATURATION QUALIFICATIONS: (This note is used to comply with regulatory documentation for home oxygen)  Patient Saturations on Room Air at Rest = 89-90%  Patient Saturations on Room Air while Ambulating = 79-81%  Patient Saturations on 2 Liters of oxygen while Ambulating = 95%  Please briefly explain why patient needs home oxygen: Patient O2 sats decreased to 79-81% on r/a while ambulating. Expiratory wheezing noted and some dyspnea once she had been up a few minutes. O2 sats returned to 95% on 2 lpm during ambulation and maintained at rest. Text-paged Dr. Manuella Ghazi to notify. Donavan Foil, RN

## 2017-12-07 NOTE — Discharge Instructions (Signed)

## 2017-12-07 NOTE — Progress Notes (Signed)
PROGRESS NOTE    [name]  BTD:[id number] DOB: [birth year] DOA: 12/06/2017 PCP: Claretta Fraise, MD   Brief Narrative:   [name] is a 84 y.o. female with medical history significant for type 2 diabetes, dyslipidemia, hypertension, GERD, and neuropathy who presented to the emergency department with sudden onset chest tightness and shortness of breath that began at about 530 this morning and woke her from sleep.  Her symptoms apparently worsened with ambulation and she denied any other symptoms.  She was admitted with multifactorial acute hypoxemic respiratory failure contributed by possible CHF exacerbation along with pulmonary fibrosis flare.  She was also noted to have new onset atrial fibrillation and has been started on Eliquis with 2D echocardiogram pending.   Assessment & Plan:   Principal Problem:   Acute hypoxemic respiratory failure (HCC) Active Problems:   Hyperlipemia   Essential hypertension   GERD   Diabetic polyneuropathy associated with type 2 diabetes mellitus ([place])   New onset atrial fibrillation ([place])   1. Acute hypoxemic respiratory failure-persistent.  This appears to be multifactorial with component of likely progression of pulmonary fibrosis as well as components of CHF on physical examination and also with orthopnea and PND.    Continue further diuresis today with repeat dose of Lasix and add methylprednisolone 60 mg twice daily in addition to breathing treatments.   Plan for re-ambulation in a.m. 2. New onset atrial fibrillation-rate controlled.  She is currently rate controlled and appears to be on Cardizem which I will continue.  Monitor on telemetry and initiate Eliquis for anticoagulation with noted CHADs of 5.  CT chest angiogram negative for pulmonary embolus.  Will further assess with 2D echocardiogram which is pending with TSH noted to be normal limits. 3. Type 2 diabetes-controlled.  Continue home insulin and hold metformin due to recent  contrast study.  Maintain on sliding scale insulin.  Carb modified diet. 4. Hypertension.  Maintain on Cardizem CD 240 mg twice daily.  Good rate control currently noted. 5. GERD.  Maintain on PPI. 6. Diabetic polyneuropathy.  Maintain on Requip and gabapentin.   DVT prophylaxis:Eliquis Code Status: DNR Family Communication: None at bedside Disposition Plan: Continue diuresis and breathing treatments with IV steroids added and reassess in am   Consultants:   None  Procedures:   None  Antimicrobials:   None   Subjective: Patient seen and evaluated today with some ongoing chest tightness and shortness of breath.  She was ambulated today with recurrent hypoxemia and symptoms.  She appears to have diuresed well overnight.  Objective: Vitals:   12/07/17 1106 12/07/17 1147 12/07/17 1152 12/07/17 1154  BP:      Pulse:  89 (!) 107 85  Resp:      Temp:      TempSrc:      SpO2: 93% 90% (!) 81% 96%  Weight:      Height:        Intake/Output Summary (Last 24 hours) at 12/07/2017 1254 Last data filed at 12/07/2017 1012 Gross per 24 hour  Intake 243 ml  Output 1600 ml  Net -1357 ml   Filed Weights   12/06/17 0904 12/06/17 1828  Weight: 72.6 kg (160 lb) 73.5 kg (162 lb)    Examination:  General exam: Appears calm and comfortable  Respiratory system: Clear to auscultation. Respiratory effort normal.  Currently on nasal cannula. Cardiovascular system: S1 & S2 heard, RRR. No JVD, murmurs, rubs, gallops or clicks. No pedal edema. Gastrointestinal system: Abdomen is nondistended,  soft and nontender. No organomegaly or masses felt. Normal bowel sounds heard. Central nervous system: Alert and oriented. No focal neurological deficits. Extremities: Symmetric 5 x 5 power. Skin: No rashes, lesions or ulcers Psychiatry: Judgement and insight appear normal. Mood & affect appropriate.     Data Reviewed: I have personally reviewed following labs and imaging studies  CBC: Recent  Labs  Lab 12/06/17 0926 12/07/17 0559  WBC 14.8* 9.4  NEUTROABS 13.0*  --   HGB 12.5 10.8*  HCT 40.4 34.7*  MCV 81.5 82.2  PLT 255 277   Basic Metabolic Panel: Recent Labs  Lab 12/06/17 0926 12/07/17 0559  NA 139 139  K 3.7 3.3*  CL 103 105  CO2 28 28  GLUCOSE 92 81  BUN 19 14  CREATININE 0.70 0.65  CALCIUM 8.9 8.3*  MG 2.0  --    GFR: Estimated Creatinine Clearance: 40.6 mL/min (by C-G formula based on SCr of 0.65 mg/dL). Liver Function Tests: No results for input(s): AST, ALT, ALKPHOS, BILITOT, PROT, ALBUMIN in the last 168 hours. No results for input(s): LIPASE, AMYLASE in the last 168 hours. No results for input(s): AMMONIA in the last 168 hours. Coagulation Profile: No results for input(s): INR, PROTIME in the last 168 hours. Cardiac Enzymes: Recent Labs  Lab 12/06/17 0926 12/06/17 1422 12/06/17 2228 12/07/17 0600  TROPONINI <0.03 <0.03 0.03* <0.03   BNP (last 3 results) No results for input(s): PROBNP in the last 8760 hours. HbA1C: No results for input(s): HGBA1C in the last 72 hours. CBG: Recent Labs  Lab 12/06/17 1627 12/06/17 2140 12/07/17 0740 12/07/17 1158  GLUCAP 93 91 71 95   Lipid Profile: No results for input(s): CHOL, HDL, LDLCALC, TRIG, CHOLHDL, LDLDIRECT in the last 72 hours. Thyroid Function Tests: Recent Labs    12/06/17 1422  TSH 0.375   Anemia Panel: No results for input(s): VITAMINB12, FOLATE, FERRITIN, TIBC, IRON, RETICCTPCT in the last 72 hours. Sepsis Labs: No results for input(s): PROCALCITON, LATICACIDVEN in the last 168 hours.  No results found for this or any previous visit (from the past 240 hour(s)).       Radiology Studies: Ct Angio Chest Pe W/cm &/or Wo Cm  Result Date: 12/06/2017 CLINICAL DATA:  Sudden shortness of breath for several hours EXAM: CT ANGIOGRAPHY CHEST WITH CONTRAST TECHNIQUE: Multidetector CT imaging of the chest was performed using the standard protocol during bolus administration of  intravenous contrast. Multiplanar CT image reconstructions and MIPs were obtained to evaluate the vascular anatomy. CONTRAST:  175mL ISOVUE-370 IOPAMIDOL (ISOVUE-370) INJECTION 76% COMPARISON:  None. FINDINGS: Cardiovascular: Thoracic aorta and its branches demonstrate atherosclerotic calcifications without aneurysmal dilatation or dissection. Mild coronary calcifications are seen. No cardiac enlargement is noted. Pulmonary artery is well visualized within normal branching pattern. No filling defect to suggest pulmonary embolism is seen. Mediastinum/Nodes: The thoracic inlet is within normal limits. No significant hilar or mediastinal adenopathy is noted. The esophagus is within normal limits. Lungs/Pleura: Mild dependent atelectatic changes are noted. Mild emphysematous changes are noted. No focal confluent infiltrate or sizable effusion is seen. No parenchymal nodules are noted. Mild peribronchial thickening is noted. Upper Abdomen: Visualized upper abdomen is unremarkable. Musculoskeletal: Degenerative changes of the thoracic spine are noted. Degenerative changes of the shoulder joints are noted bilaterally. Review of the MIP images confirms the above findings. IMPRESSION: No evidence of pulmonary emboli. Mild dependent atelectatic changes and mild peribronchial thickening. No other focal abnormality is noted. Aortic Atherosclerosis (ICD10-I70.0) and Emphysema (ICD10-J43.9). Electronically Signed  By: Inez Catalina M.D.   On: 12/06/2017 11:19   Dg Chest Port 1 View  Result Date: 12/06/2017 CLINICAL DATA:  Shortness of breath and chest pain EXAM: PORTABLE CHEST 1 VIEW COMPARISON:  03/09/2017 FINDINGS: Cardiac shadow is stable. Vascular congestion is noted with mild interstitial edema. Bibasilar atelectatic changes as well as a small left pleural effusion are seen. No bony abnormality is noted. IMPRESSION: Changes of mild CHF. Electronically Signed   By: Inez Catalina M.D.   On: 12/06/2017 09:48         Scheduled Meds: . apixaban  5 mg Oral BID  . calcium-vitamin D  2 tablet Oral QODAY  . cholecalciferol  2,000 Units Oral Daily  . diltiazem  240 mg Oral BID  . furosemide  40 mg Intravenous Once  . gabapentin  300 mg Oral BID  . gi cocktail  30 mL Oral Once  . insulin aspart  0-15 Units Subcutaneous TID WC  . insulin aspart  0-5 Units Subcutaneous QHS  . insulin glargine  26 Units Subcutaneous Q2200  . [START ON 12/08/2017] meloxicam  7.5 mg Oral Daily  . methylPREDNISolone (SOLU-MEDROL) injection  60 mg Intravenous Q12H  . nitrofurantoin (macrocrystal-monohydrate)  100 mg Oral QHS  . pantoprazole  40 mg Oral Daily  . potassium chloride  20 mEq Oral BID  . rOPINIRole  0.25 mg Oral BID  . sodium chloride flush  3 mL Intravenous Q12H  . vitamin B-12  100 mcg Oral Daily   Continuous Infusions: . sodium chloride       LOS: 0 days    Time spent: 30 minutes    Jacobi Nile Darleen Crocker, DO [facility] Pager [phone]  If 7PM-7AM, please contact night-coverage [email or web address] Password [email or web address] 12/07/2017, 12:54 PM

## 2017-12-08 LAB — GLUCOSE, CAPILLARY
GLUCOSE-CAPILLARY: 218 mg/dL — AB (ref 70–99)
Glucose-Capillary: 250 mg/dL — ABNORMAL HIGH (ref 70–99)
Glucose-Capillary: 193 mg/dL — ABNORMAL HIGH (ref 70–99)

## 2017-12-08 MED ORDER — INSULIN ASPART 100 UNIT/ML ~~LOC~~ SOLN
0.0000 [IU] | Freq: Three times a day (TID) | SUBCUTANEOUS | Status: DC
  Administered 2017-12-08 (×2): 7 [IU] via SUBCUTANEOUS
  Administered 2017-12-09 (×3): 4 [IU] via SUBCUTANEOUS
  Administered 2017-12-10: 3 [IU] via SUBCUTANEOUS
  Administered 2017-12-10: 4 [IU] via SUBCUTANEOUS

## 2017-12-08 MED ORDER — INSULIN ASPART 100 UNIT/ML ~~LOC~~ SOLN: 0.0000 [IU] | Freq: Three times a day (TID) | SUBCUTANEOUS | Status: DC

## 2017-12-08 MED ORDER — METHYLPREDNISOLONE SODIUM SUCC 40 MG IJ SOLR
40.0000 mg | Freq: Two times a day (BID) | INTRAMUSCULAR | Status: DC
  Administered 2017-12-08 – 2017-12-09 (×3): 40 mg via INTRAVENOUS
  Filled 2017-12-08 (×4): qty 1

## 2017-12-08 MED ORDER — INSULIN ASPART 100 UNIT/ML ~~LOC~~ SOLN
3.0000 [IU] | Freq: Three times a day (TID) | SUBCUTANEOUS | Status: DC
  Administered 2017-12-08 – 2017-12-10 (×7): 3 [IU] via SUBCUTANEOUS

## 2017-12-08 MED ORDER — FUROSEMIDE 10 MG/ML IJ SOLN
40.0000 mg | Freq: Once | INTRAMUSCULAR | Status: AC
  Administered 2017-12-08: 40 mg via INTRAVENOUS
  Filled 2017-12-08: qty 4

## 2017-12-08 NOTE — Care Management (Signed)
Co-pay amount  for the Xeralto 15mg  daily $45.00 for 30 day supply. No PA required.

## 2017-12-08 NOTE — Care Management Note (Signed)
Case Management Note  Patient Details  Name: [name] MRN: [id number] Date of Birth: Feb 29, 1926  Subjective/Objective:    Admitted with hypoxia and a-fib. Pt from home, lives alone, ind pta. Has insurance with drug coverage. Pt drives herself.                 Action/Plan: Pt is on oxygen acutely, she has had home O2 in the past through [facility] and would want them again if she is unable to wean. Benefits check completed for eliquis and pt's co-pay will be $45 for 30 day supply. Pt says this is too much and she will not be able to afford long term. We discuss other options, Alen Blew will have same co-pay and coumadin comes with diet restrictions and frequent lab draws with dose adjustments. Pt given 30-day free card and will discuss more with attending. Potential DC home today. Pt will have home O2 eval in AM. CM will f/u with home oxygen referral if pt requires it.   Expected Discharge Date:     12/08/2017             Expected Discharge Plan:  Home/Self Care  In-House Referral:  NA  Discharge planning Services  CM Consult  Post Acute Care Choice:  Durable Medical Equipment Choice offered to:  Patient  DME Arranged:    DME Agency:  [place]:    [facility] Agency:     Status of Service:  In process, will continue to follow  If discussed at Long Length of Stay Meetings, dates discussed:    Additional Comments:  Sherald Barge, RN 12/08/2017, 3:24 PM

## 2017-12-08 NOTE — Progress Notes (Signed)
Inpatient Diabetes Program Recommendations  AACE/ADA: New Consensus Statement on Inpatient Glycemic Control (2015)  Target Ranges:  Prepandial:   less than 140 mg/dL      Peak postprandial:   less than 180 mg/dL (1-2 hours)      Critically ill patients:  140 - 180 mg/dL   Results for DEYA, BIGOS (MRN [id number]) as of 12/08/2017 08:29  Ref. Range 12/07/2017 07:40 12/07/2017 11:58 12/07/2017 16:35 12/07/2017 21:12 12/08/2017 07:43  Glucose-Capillary Latest Ref Range: 70 - 99 mg/dL 71 95 173 (H) 290 (H) 218 (H)   Review of Glycemic Control  Diabetes history: DM 2 Outpatient Diabetes medications: Lantus 26 units Daily, Metformin 500 mg Daily Current orders for Inpatient glycemic control: Lantus 26 units Daily, Novolog 0-15 units tid, Novolog 0-5 units qhs  Inpatient Diabetes Program Recommendations:    Patient on IV Solumedrol. Glucose trends increased into the 200's. Consider adding Novolog 3 units tid meal coverage in addition to correction scale if patient consumes at least 50% of meals.  Thanks,  Tama Headings RN, MSN, BC-ADM Inpatient Diabetes Coordinator Team Pager [phone] (8a-5p)

## 2017-12-08 NOTE — Progress Notes (Signed)
PROGRESS NOTE    [name]  NLZ:[id number] DOB: 05-22-1925 DOA: 12/06/2017 PCP: Claretta Fraise, MD   Brief Narrative:   Gilford Rile Stricklandis a 84 y.o.femalewith medical history significant fortype 2 diabetes, dyslipidemia, hypertension, GERD, and neuropathy who presented to the emergency department with sudden onset chest tightness and shortness of breath that began at about 530 this morning and woke her from sleep. Her symptoms apparently worsened with ambulation and she denied any other symptoms.  She was admitted with multifactorial acute hypoxemic respiratory failure contributed by possible CHF exacerbation along with pulmonary fibrosis flare.  She was also noted to have new onset atrial fibrillation and has been started on Eliquis with 2D echocardiogram demonstrating no acute abnormalities.   Assessment & Plan:   Principal Problem:   Acute hypoxemic respiratory failure (HCC) Active Problems:   Hyperlipemia   Essential hypertension   GERD   Diabetic polyneuropathy associated with type 2 diabetes mellitus ([place])   New onset atrial fibrillation ([place])   1. Acute hypoxemic respiratory failure-persistent. This appears to be multifactorial with component of likely progression of pulmonary fibrosis as well as components of CHF on physical examination and also with orthopnea and PND.   Continue further diuresis today with repeat dose of Lasix and taper methylprednisolone to 40 mg twice daily in addition to breathing treatments.  Plan for re-ambulation in a.m. After weaning O2 further today. 2. New onset atrial fibrillation-rate controlled. She is currently rate controlled and appears to be on Cardizem which I will continue. Monitor on telemetry and initiate Eliquis for anticoagulation with noted CHADs of 5.  She states that she may not be able to for this medication at home, but will follow up with her insurance company and does have a 30-day free trial that has been given to  her. CT chest angiogram negative for pulmonary embolus. TSH noted to be within normal limits and 2D echocardiogram with LVEF 60 to 65% noted and no other abnormalities. 3. Type 2 diabetes- with steroid-induced hyperglycemia. Continue home insulin and hold metformin due to recent contrast study. Maintain on sliding scale insulin which has been increased to resistant with mealtime insulin. Continue usual long-acting insulin.  Carb modified diet. 4. Hypertension. Maintain on Cardizem CD 240 mg twice daily. Good rate control currently noted. 5. GERD. Maintain on PPI. 6. Diabetic polyneuropathy. Maintain on Requip and gabapentin.   DVT prophylaxis:Eliquis Code Status: DNR Family Communication: None at bedside Disposition Plan: Continue diuresis, breathing treatments, and IV methylprednisolone which has been tapered today.  Plan for ambulatory oxygen likely in a.m. if further improved and possible discharge in a.m.   Consultants:   None  Procedures:   None  Antimicrobials:   None   Subjective: Patient seen and evaluated today with no new acute complaints or concerns. No acute concerns or events noted overnight.  She states that overall she is feeling some better and can take in a deep breath and has much less chest tightness.  Objective: Vitals:   12/07/17 1457 12/07/17 2151 12/07/17 2258 12/08/17 0632  BP: (!) 133/58 (!) 154/100 138/70 (!) 142/97  Pulse: 74 78  75  Resp: 18     Temp: 99.9 F (37.7 C) 98.7 F (37.1 C)  98.2 F (36.8 C)  TempSrc:  Oral  Oral  SpO2: 91% 93%  90%  Weight:    73.1 kg (161 lb 2.5 oz)  Height:        Intake/Output Summary (Last 24 hours) at 12/08/2017 1138 Last data  filed at 12/08/2017 0635 Gross per 24 hour  Intake 480 ml  Output 1200 ml  Net -720 ml   Filed Weights   12/06/17 0904 12/06/17 1828 12/08/17 5573  Weight: 72.6 kg (160 lb) 73.5 kg (162 lb) 73.1 kg (161 lb 2.5 oz)    Examination:  General exam: Appears calm and  comfortable  Respiratory system: Clear to auscultation. Respiratory effort normal.  Currently on 2 L nasal cannula. Cardiovascular system: S1 & S2 heard, RRR. No JVD, murmurs, rubs, gallops or clicks. No pedal edema. Gastrointestinal system: Abdomen is nondistended, soft and nontender. No organomegaly or masses felt. Normal bowel sounds heard. Central nervous system: Alert and oriented. No focal neurological deficits. Extremities: Symmetric 5 x 5 power. Skin: No rashes, lesions or ulcers Psychiatry: Judgement and insight appear normal. Mood & affect appropriate.     Data Reviewed: I have personally reviewed following labs and imaging studies  CBC: Recent Labs  Lab 12/06/17 0926 12/07/17 0559  WBC 14.8* 9.4  NEUTROABS 13.0*  --   HGB 12.5 10.8*  HCT 40.4 34.7*  MCV 81.5 82.2  PLT 255 220   Basic Metabolic Panel: Recent Labs  Lab 12/06/17 0926 12/07/17 0559  NA 139 139  K 3.7 3.3*  CL 103 105  CO2 28 28  GLUCOSE 92 81  BUN 19 14  CREATININE 0.70 0.65  CALCIUM 8.9 8.3*  MG 2.0  --    GFR: Estimated Creatinine Clearance: 40.5 mL/min (by C-G formula based on SCr of 0.65 mg/dL). Liver Function Tests: No results for input(s): AST, ALT, ALKPHOS, BILITOT, PROT, ALBUMIN in the last 168 hours. No results for input(s): LIPASE, AMYLASE in the last 168 hours. No results for input(s): AMMONIA in the last 168 hours. Coagulation Profile: No results for input(s): INR, PROTIME in the last 168 hours. Cardiac Enzymes: Recent Labs  Lab 12/06/17 0926 12/06/17 1422 12/06/17 2228 12/07/17 0600  TROPONINI <0.03 <0.03 0.03* <0.03   BNP (last 3 results) No results for input(s): PROBNP in the last 8760 hours. HbA1C: No results for input(s): HGBA1C in the last 72 hours. CBG: Recent Labs  Lab 12/07/17 0740 12/07/17 1158 12/07/17 1635 12/07/17 2112 12/08/17 0743  GLUCAP 71 95 173* 290* 218*   Lipid Profile: No results for input(s): CHOL, HDL, LDLCALC, TRIG, CHOLHDL, LDLDIRECT  in the last 72 hours. Thyroid Function Tests: Recent Labs    12/06/17 1422  TSH 0.375   Anemia Panel: No results for input(s): VITAMINB12, FOLATE, FERRITIN, TIBC, IRON, RETICCTPCT in the last 72 hours. Sepsis Labs: No results for input(s): PROCALCITON, LATICACIDVEN in the last 168 hours.  No results found for this or any previous visit (from the past 240 hour(s)).       Radiology Studies: No results found.      Scheduled Meds: . apixaban  5 mg Oral BID  . calcium-vitamin D  2 tablet Oral QODAY  . cholecalciferol  2,000 Units Oral Daily  . diltiazem  240 mg Oral BID  . furosemide  40 mg Intravenous Once  . gabapentin  300 mg Oral BID  . gi cocktail  30 mL Oral Once  . insulin aspart  0-20 Units Subcutaneous TID WC  . insulin aspart  0-5 Units Subcutaneous QHS  . insulin aspart  3 Units Subcutaneous TID WC  . insulin glargine  26 Units Subcutaneous Q2200  . meloxicam  7.5 mg Oral Daily  . methylPREDNISolone (SOLU-MEDROL) injection  40 mg Intravenous Q12H  . nitrofurantoin (macrocrystal-monohydrate)  100 mg  Oral QHS  . pantoprazole  40 mg Oral Daily  . potassium chloride  20 mEq Oral BID  . rOPINIRole  0.25 mg Oral BID  . sodium chloride flush  3 mL Intravenous Q12H  . vitamin B-12  100 mcg Oral Daily   Continuous Infusions: . sodium chloride       LOS: 1 day    Time spent: 30 minutes    Loye Reininger Darleen Crocker, DO [facility] Pager [phone]  If 7PM-7AM, please contact night-coverage [email or web address] Password [email or web address] 12/08/2017, 11:38 AM

## 2017-12-08 NOTE — Progress Notes (Signed)
Pt weaned to room air. O2 sats 93% at rest. Ambulating pt in hall.O2 sats remained 93%.

## 2017-12-09 DIAGNOSIS — R0902 Hypoxemia: Secondary | ICD-10-CM

## 2017-12-09 DIAGNOSIS — I4891 Unspecified atrial fibrillation: Secondary | ICD-10-CM

## 2017-12-09 DIAGNOSIS — J9601 Acute respiratory failure with hypoxia: Principal | ICD-10-CM

## 2017-12-09 DIAGNOSIS — E782 Mixed hyperlipidemia: Secondary | ICD-10-CM

## 2017-12-09 DIAGNOSIS — R0602 Shortness of breath: Secondary | ICD-10-CM

## 2017-12-09 LAB — BASIC METABOLIC PANEL
Anion gap: 9 (ref 5–15)
BUN: 34 mg/dL — ABNORMAL HIGH (ref 8–23)
CALCIUM: 9.5 mg/dL (ref 8.9–10.3)
CO2: 25 mmol/L (ref 22–32)
Chloride: 100 mmol/L (ref 98–111)
Creatinine, Ser: 0.79 mg/dL (ref 0.44–1.00)
GFR calc Af Amer: >60 mL/min (ref >60)
GFR calc non Af Amer: >60 mL/min (ref >60)
GLUCOSE: 202 mg/dL — AB (ref 70–99)
POTASSIUM: 5.1 mmol/L (ref 3.5–5.1)
Sodium: 134 mmol/L — ABNORMAL LOW (ref 135–145)

## 2017-12-09 LAB — GLUCOSE, CAPILLARY
GLUCOSE-CAPILLARY: 168 mg/dL — AB (ref 70–99)
GLUCOSE-CAPILLARY: 197 mg/dL — AB (ref 70–99)
Glucose-Capillary: 191 mg/dL — ABNORMAL HIGH (ref 70–99)
Glucose-Capillary: 183 mg/dL — ABNORMAL HIGH (ref 70–99)
Glucose-Capillary: 182 mg/dL — ABNORMAL HIGH (ref 70–99)

## 2017-12-09 LAB — CBC
HEMATOCRIT: 37.6 % (ref 36.0–46.0)
Hemoglobin: 11.1 g/dL — ABNORMAL LOW (ref 12.0–15.0)
MCH: 24.1 pg — AB (ref 26.0–34.0)
MCHC: 29.5 g/dL — ABNORMAL LOW (ref 30.0–36.0)
MCV: 81.7 fL (ref 78.0–100.0)
Platelets: 258 10*3/uL (ref 150–400)
RBC: 4.6 MIL/uL (ref 3.87–5.11)
RDW: 17.7 % — AB (ref 11.5–15.5)
WBC: 13.4 10*3/uL — AB (ref 4.0–10.5)

## 2017-12-09 LAB — MAGNESIUM: Magnesium: 2.3 mg/dL (ref 1.7–2.4)

## 2017-12-09 MED ORDER — FUROSEMIDE 20 MG PO TABS
20.0000 mg | ORAL_TABLET | Freq: Every day | ORAL | Status: DC
  Administered 2017-12-10: 20 mg via ORAL
  Filled 2017-12-09: qty 1

## 2017-12-09 MED ORDER — PREDNISONE 20 MG PO TABS
40.0000 mg | ORAL_TABLET | Freq: Every day | ORAL | Status: DC
  Administered 2017-12-10: 40 mg via ORAL
  Filled 2017-12-09: qty 2

## 2017-12-09 NOTE — Progress Notes (Signed)
PROGRESS NOTE    [name]  VOJ:[id number] DOB: Jun 09, 1925 DOA: 12/06/2017 PCP: Claretta Fraise, MD   Brief Narrative:   Joan Rile Stricklandis a 84 y.o.femalewith medical history significant fortype 2 diabetes, dyslipidemia, hypertension, GERD, and neuropathy who presented to the emergency department with sudden onset chest tightness and shortness of breath that began at about 530 this morning and woke her from sleep. Her symptoms apparently worsened with ambulation and she denied any other symptoms.  She was admitted with multifactorial acute hypoxemic respiratory failure contributed by possible CHF exacerbation along with pulmonary fibrosis flare.  She was also noted to have new onset atrial fibrillation and has been started on Eliquis with 2D echocardiogram demonstrating no acute abnormalities.   Assessment & Plan:   Principal Problem:   Acute hypoxemic respiratory failure (HCC) Active Problems:   Hyperlipemia   Essential hypertension   GERD   Diabetic polyneuropathy associated with type 2 diabetes mellitus ([place])   New onset atrial fibrillation ([place])   1. Acute hypoxemic respiratory failure-persistent. This appears to be multifactorial with component of likely progression of pulmonary fibrosis as well as components of CHF on physical examination and also with orthopnea and PND.   improving and off O2 supplementation at rest currently. Will transition lasix and prednisone to PO. 2. New onset atrial fibrillation-rate controlled. She is currently rate controlled and appears to be on Cardizem which would be continued. Monitor on telemetry and initiate Eliquis for anticoagulation with noted CHADsVasc Score of 5.  She states that she may not be able to afford  this medication at home, but will follow up with her insurance company and does have a 30-day free trial that has been given to her. CT chest angiogram negative for pulmonary embolus. TSH noted to be within normal limits  and 2D echocardiogram with LVEF 60 to 65% noted and no other abnormalities. 3. Type 2 diabetes- with steroid-induced hyperglycemia. Continue home insulin and hold metformin due to recent contrast study. Maintain on sliding scale insulin which has been increased to resistant with mealtime insulin. Continue usual long-acting insulin.  Carb modified diet. 4. Hypertension. continue cardizem. Will also add low dose lasix to control volume. 5. GERD. continue PPI. 6. Diabetic polyneuropathy. Maintain on Requip and gabapentin. Stable and well controlled.   DVT prophylaxis:Eliquis Code Status: DNR Family Communication: None at bedside Disposition Plan: Continue diuresis, breathing treatments, will also transition steroids and lasix to oral regimen.   Consultants:   None  Procedures:   None  Antimicrobials:   None   Subjective: Feeling better. No CP, no nausea, no vomiting. Still having difficulty speaking in full sentences. Reported also intermittent orthopnea.    Objective: Vitals:   12/08/17 2118 12/09/17 0646 12/09/17 1440 12/09/17 2141  BP: 128/73 (!) 150/62 133/78   Pulse: 62 (!) 55 67   Resp: 17 18 16    Temp: 98.4 F (36.9 C) 98.3 F (36.8 C) 98.3 F (36.8 C)   TempSrc: Oral Oral Oral   SpO2: 94% 94% 95% 93%  Weight:      Height:        Intake/Output Summary (Last 24 hours) at 12/09/2017 2215 Last data filed at 12/09/2017 1700 Gross per 24 hour  Intake 960 ml  Output -  Net 960 ml   Filed Weights   12/06/17 0904 12/06/17 1828 12/08/17 9937  Weight: 72.6 kg (160 lb) 73.5 kg (162 lb) 73.1 kg (161 lb 2.5 oz)    Examination: General exam: Alert, awake, oriented x  3; reports overall improvement and is not longer requiring Os supplementation. Patient still experiencing intermittent episodes of orthopnea and is having mild difficulty speaking in full sentences.  Respiratory system: decrease BS at bases, positive rhonchi, mild exp wheezing. Cardiovascular  system:RRR. No murmurs, rubs, gallops. Gastrointestinal system: Abdomen is nondistended, soft and nontender. No organomegaly or masses felt. Normal bowel sounds heard. Central nervous system: Alert and oriented. No focal neurological deficits. Extremities: No C/C/E, +pedal pulses Skin: No rashes, lesions or ulcers Psychiatry: Judgement and insight appear normal. Mood & affect appropriate.    General exam: Appears calm and comfortable  Respiratory system: Clear to auscultation. Respiratory effort normal.  Currently on 2 L nasal cannula. Cardiovascular system: S1 & S2 heard, RRR. No JVD, murmurs, rubs, gallops or clicks. No pedal edema. Gastrointestinal system: Abdomen is nondistended, soft and nontender. No organomegaly or masses felt. Normal bowel sounds heard. Central nervous system: Alert and oriented. No focal neurological deficits. Extremities: Symmetric 5 x 5 power. Skin: No rashes, lesions or ulcers Psychiatry: Judgement and insight appear normal. Mood & affect appropriate.     Data Reviewed: I have personally reviewed following labs and imaging studies  CBC: Recent Labs  Lab 12/06/17 0926 12/07/17 0559 12/09/17 0558  WBC 14.8* 9.4 13.4*  NEUTROABS 13.0*  --   --   HGB 12.5 10.8* 11.1*  HCT 40.4 34.7* 37.6  MCV 81.5 82.2 81.7  PLT 255 211 979   Basic Metabolic Panel: Recent Labs  Lab 12/06/17 0926 12/07/17 0559 12/09/17 0558  NA 139 139 134*  K 3.7 3.3* 5.1  CL 103 105 100  CO2 28 28 25   GLUCOSE 92 81 202*  BUN 19 14 34*  CREATININE 0.70 0.65 0.79  CALCIUM 8.9 8.3* 9.5  MG 2.0  --  2.3   GFR: Estimated Creatinine Clearance: 40.5 mL/min (by C-G formula based on SCr of 0.79 mg/dL).  Cardiac Enzymes: Recent Labs  Lab 12/06/17 0926 12/06/17 1422 12/06/17 2228 12/07/17 0600  TROPONINI <0.03 <0.03 0.03* <0.03   CBG: Recent Labs  Lab 12/08/17 2124 12/09/17 0754 12/09/17 1140 12/09/17 1622 12/09/17 2146  GLUCAP 193* 191* 183* 168* 182*     Radiology Studies: No results found.   Scheduled Meds: . apixaban  5 mg Oral BID  . calcium-vitamin D  2 tablet Oral QODAY  . cholecalciferol  2,000 Units Oral Daily  . diltiazem  240 mg Oral BID  . [START ON 12/10/2017] furosemide  20 mg Oral Daily  . gabapentin  300 mg Oral BID  . gi cocktail  30 mL Oral Once  . insulin aspart  0-20 Units Subcutaneous TID WC  . insulin aspart  0-5 Units Subcutaneous QHS  . insulin aspart  3 Units Subcutaneous TID WC  . insulin glargine  26 Units Subcutaneous Q2200  . nitrofurantoin (macrocrystal-monohydrate)  100 mg Oral QHS  . pantoprazole  40 mg Oral Daily  . [START ON 12/10/2017] predniSONE  40 mg Oral Q breakfast  . rOPINIRole  0.25 mg Oral BID  . sodium chloride flush  3 mL Intravenous Q12H  . vitamin B-12  100 mcg Oral Daily   Continuous Infusions: . sodium chloride       LOS: 2 days    Time spent: 30 minutes    Barton Dubois, MD [facility] Pager [phone]  If 7PM-7AM, please contact night-coverage [email or web address] Password [email or web address] 12/09/2017, 10:15 PM

## 2017-12-09 NOTE — Care Management Important Message (Signed)
Important Message  Patient Details  Name: [name] MRN: [id number] Date of Birth: Nov 27, 1925   Medicare Important Message Given:  Yes    Shelda Altes 12/09/2017, 1:28 PM

## 2017-12-10 DIAGNOSIS — I5033 Acute on chronic diastolic (congestive) heart failure: Secondary | ICD-10-CM

## 2017-12-10 DIAGNOSIS — E1142 Type 2 diabetes mellitus with diabetic polyneuropathy: Secondary | ICD-10-CM

## 2017-12-10 DIAGNOSIS — R0989 Other specified symptoms and signs involving the circulatory and respiratory systems: Secondary | ICD-10-CM

## 2017-12-10 DIAGNOSIS — J8410 Pulmonary fibrosis, unspecified: Secondary | ICD-10-CM

## 2017-12-10 DIAGNOSIS — E785 Hyperlipidemia, unspecified: Secondary | ICD-10-CM

## 2017-12-10 DIAGNOSIS — I5032 Chronic diastolic (congestive) heart failure: Secondary | ICD-10-CM

## 2017-12-10 DIAGNOSIS — I10 Essential (primary) hypertension: Secondary | ICD-10-CM

## 2017-12-10 DIAGNOSIS — K219 Gastro-esophageal reflux disease without esophagitis: Secondary | ICD-10-CM

## 2017-12-10 LAB — GLUCOSE, CAPILLARY
GLUCOSE-CAPILLARY: 174 mg/dL — AB (ref 70–99)
Glucose-Capillary: 137 mg/dL — ABNORMAL HIGH (ref 70–99)

## 2017-12-10 MED ORDER — IPRATROPIUM-ALBUTEROL 20-100 MCG/ACT IN AERS: 1.0000 | INHALATION_SPRAY | Freq: Three times a day (TID) | RESPIRATORY_TRACT | 1 refills | Status: DC | PRN

## 2017-12-10 MED ORDER — PREDNISONE 20 MG PO TABS: ORAL_TABLET | ORAL | 0 refills | Status: DC

## 2017-12-10 MED ORDER — APIXABAN 5 MG PO TABS: 5.0000 mg | ORAL_TABLET | Freq: Two times a day (BID) | ORAL | 1 refills | Status: DC

## 2017-12-10 MED ORDER — FUROSEMIDE 20 MG PO TABS
20.0000 mg | ORAL_TABLET | Freq: Every day | ORAL | 1 refills | Status: DC
Start: 2017-12-11 — End: 2017-12-22

## 2017-12-10 NOTE — Care Management Note (Signed)
Case Management Note  Patient Details  Name: [name] MRN: [id number] Date of Birth: Jan 05, 1926   Expected Discharge Date:      12/10/17            Expected Discharge Plan:  [place]  In-House Referral:  NA  Discharge planning Services  CM Consult  Post Acute Care Choice:  Durable Medical Equipment Choice offered to:  Patient  DME Arranged:    DME Agency:  [place] Arranged:  RN [facility] Agency:  [place]  Status of Service:  Completed, signed off  Additional Comments: DC home today with referral for [facility] nursing. Pt has chosen [facility] from list of [name] providers. She is aware HH has 48 hrs to make first visit. Vaughan Basta, [facility] rep, aware of DC. Pt has no made up her mind about anticoagulation yet. She has been given more information and will review once she gets home. Mentions she is very over welmed with all of the new information being given. CM ensured her that the [facility] nurse would help go through everything and make sure she is doing what she is supposed to when they make their visit. Pt on [facility] registry, referral not being made because this is her first admission. [place] nursing should be sufficient.   Sherald Barge, RN 12/10/2017, 1:47 PM

## 2017-12-10 NOTE — Progress Notes (Signed)
Discharge instructions gone over with patient, verbalized understanding. IV removed, patient tolerated procedure well. Printed prescriptions given to patient. 

## 2017-12-10 NOTE — Discharge Summary (Signed)
Physician Discharge Summary  [name] QXI:[id number] DOB: March 11, 1926 DOA: 12/06/2017  PCP: Claretta Fraise, MD  Admit date: 12/06/2017 Discharge date: 12/10/2017  Time spent: 35 minutes  Recommendations for Outpatient Follow-up:  1. Repeat basic metabolic panel to follow electrolytes and renal function. 2. Reassess blood pressure and further adjust antihypertensive regimen as needed.   Discharge Diagnoses:  Principal Problem:   Acute hypoxemic respiratory failure (HCC) Active Problems:   Hyperlipemia   Essential hypertension   Pulmonary fibrosis (HCC)   GERD   Diabetic polyneuropathy associated with type 2 diabetes mellitus ([place])   New onset atrial fibrillation (HCC)   Pulmonary vascular congestion   Acute on chronic diastolic CHF (congestive heart failure) ([place])   Discharge Condition: Improved.  Patient has been discharged home with home health nurse to facilitate transition of care and follow-up on further teaching/education on her symptoms and condition.  Also advised to arrange follow-up with PCP in 10 days.  Diet recommendation: Heart healthy (low sodium) diet.  Filed Weights   12/06/17 1828 12/08/17 0632 12/10/17 0605  Weight: 73.5 kg 73.1 kg 74.5 kg    History of present illness:  As per H&P written by Dr. Manuella Ghazi on 8/19. 84 y.o. female with medical history significant for type 2 diabetes, dyslipidemia, hypertension, GERD, and neuropathy who presented to the emergency department with sudden onset chest tightness and shortness of breath that began at about 530 this morning and woke her from sleep.  Her symptoms apparently worsened with ambulation and she denied any other symptoms.  She continues to take her home medications as previously prescribed and struggles with lower extremity edema which is chronic for her.  She has no prior history of heart failure or any arrhythmias.  Notably, it appears that patient may have had pulmonary fibrosis in the past and was on oxygen at  one-point, but is not currently on any oxygen. She denies any fever, chills, cough, palpitations, abdominal pain, nausea, vomiting, diarrhea, or other injuries.  She denies any active chest pain currently.  Hospital Course:  1. Acute hypoxemic respiratory failure-persistent. This appears to be multifactorial with component of likely progression of pulmonary fibrosis as well as components of acute on chronis diastolic CHF. No longer needing oxygen at discharge.  Patient also denies orthopnea and is currently able to speak in full sentences.  Discharge on the steroids tapering, as needed Combivent inhalers and low-dose Lasix.  Advised to follow low-sodium diet, daily weights and outpatient follow-up with PCP. 2. New onset atrial fibrillation-rate controlled. She is currently rate controlled and appears to be on Cardizem which would be continued. started on Eliquis for anticoagulation with noted CHADsVasc Score of 5.  She states that she may not be able to afford  this medication at home, but will follow up with her insurance company and does have a 30-day free trial that has been given to her. CT chest angiogram negative for pulmonary embolus. TSH noted to be within normal limits and 2D echocardiogram with LVEF 60 to 65% noted and no other abnormalities. 3. Type 2 diabetes- with steroid-induced hyperglycemia. Continue home insulin and resume metformin at discharge. A1C demonstrating excellent control of her diabetes. Will expect resolution of current elevated CBG's once steroids tapering completed.  4. Hypertension. Continue cardizem. Will also add low dose lasix to control volume status. Patient advised to follow heart healthy diet. 5. GERD. continue PPI. 6. Diabetic polyneuropathy. Maintain on requipandgabapentin. Stable and well controlled. 7. Acute on chronic diastolic heart failure: improved after  volume stabilized. Will discharge on lasix 20mg  daily and asked her to follow low sodium diet  and daily weights.   Procedures:  See below for x-ray reports   Consultations:  None   Discharge Exam: Vitals:   12/10/17 0605 12/10/17 1456  BP: (!) 120/96 (!) 146/73  Pulse: 67 (!) 56  Resp: 17 18  Temp: 97.9 F (36.6 C) 98.3 F (36.8 C)  SpO2: 94% 93%    General: Afebrile, no chest pain, no palpitations and significant improvement in her difficulty breathing.  Patient is now able to speak in full sentences. Cardiovascular: Rate controlled, no rubs, soft ejection murmur, no JVD, no gallops. Respiratory: Improved air movement bilaterally, no wheezing, no using accessory muscles and no requiring oxygen supplementation.  Abdomen: soft, NT, ND, positive BS Extremities: no edema, no cyanosis, no clubbing.   Discharge Instructions   Discharge Instructions    (HEART FAILURE PATIENTS) Call MD:  Anytime you have any of the following symptoms: 1) 3 pound weight gain in 24 hours or 5 pounds in 1 week 2) shortness of breath, with or without a dry hacking cough 3) swelling in the hands, feet or stomach 4) if you have to sleep on extra pillows at night in order to breathe.   Complete by:  As directed    Diet - low sodium heart healthy   Complete by:  As directed    Discharge instructions   Complete by:  As directed    Take medications as prescribed Avoid the use of extra NSAID's Maintain adequate hydration Please follow low sodium diet (< 2.5 gram daily; review nutritional labels and calculate portions) Arrange follow up with PCP in 10 days   Increase activity slowly   Complete by:  As directed      Allergies as of 12/10/2017      Reactions   Diltiazem Hcl    Red itchy rash started a few hours after taking short acting 120mg  dilt tabs   Ace Inhibitors Cough      Medication List    STOP taking these medications   BAYER BACK & BODY PAIN EX ST PO     TAKE these medications   apixaban 5 MG Tabs tablet Commonly known as:  ELIQUIS Take 1 tablet (5 mg total) by mouth 2 (two)  times daily.   CALCIUM 1200 PO Take 1 tablet by mouth every other day.   diltiazem 240 MG 24 hr capsule Commonly known as:  CARDIZEM CD Take 1 capsule (240 mg total) by mouth 2 (two) times daily. What changed:    how much to take  when to take this  additional instructions   furosemide 20 MG tablet Commonly known as:  LASIX Take 1 tablet (20 mg total) by mouth daily. Start taking on:  12/11/2017   gabapentin 100 MG capsule Commonly known as:  NEURONTIN Take 3 capsules (300 mg total) by mouth 2 (two) times daily.   glucose blood test strip USE TO check blood sugars twice daily   Insulin Glargine 100 UNIT/ML Solostar Pen Commonly known as:  LANTUS INJECT UP TO 26 UNITS ONCE DAILY AT 10PM   Insulin Pen Needle 31G X 8 MM Misc Use once daily with lantus solostar   Ipratropium-Albuterol 20-100 MCG/ACT Aers respimat Commonly known as:  COMBIVENT Inhale 1 puff into the lungs every 8 (eight) hours as needed for wheezing or shortness of breath.   meloxicam 15 MG tablet Commonly known as:  MOBIC Take 1 tablet (15 mg  total) by mouth daily.   metFORMIN 500 MG tablet Commonly known as:  GLUCOPHAGE Take 1 Tablet by mouth once daily with BREAKFAST   nitrofurantoin (macrocrystal-monohydrate) 100 MG capsule Commonly known as:  MACROBID Take 1 capsule (100 mg total) by mouth at bedtime.   omeprazole 20 MG capsule Commonly known as:  PRILOSEC Take 1 Capsule by mouth 2 times a day BEFORE meals   predniSONE 20 MG tablet Commonly known as:  DELTASONE Take 2 tablets by mouth daily X1 day; then 1 tablet by mouth daily X 2 days; then 1/2 tablet by mouth daily X 3 days and stop prednisone.   rOPINIRole 0.25 MG tablet Commonly known as:  REQUIP take 1  Tablet by mouth 3 times daily for leg cramps What changed:    how much to take  how to take this  when to take this   triamcinolone 0.025 % ointment Commonly known as:  KENALOG Apply 1 application topically 2 (two) times  daily. What changed:  when to take this   VITAMIN B 12 PO Take 1 tablet by mouth daily.   Vitamin D3 5000 units Caps Take 2,000 Units by mouth daily.      Allergies  Allergen Reactions  . Diltiazem Hcl     Red itchy rash started a few hours after taking short acting 120mg  dilt tabs  . Ace Inhibitors Cough   Follow-up Information    [facility].   Specialty:  [place] Why:  they will call you Contact information: [address] [place] [address] [phone]        Claretta Fraise, MD. Schedule an appointment as soon as possible for a visit in 10 day(s).   Specialty:  Family Medicine Why:  Tuesday, December 22, 2017 at 3:10 PM Contact information: [address] [address] [phone]           The results of significant diagnostics from this hospitalization (including imaging, microbiology, ancillary and laboratory) are listed below for reference.    Significant Diagnostic Studies: Ct Angio Chest Pe W/cm &/or Wo Cm  Result Date: 12/06/2017 CLINICAL DATA:  Sudden shortness of breath for several hours EXAM: CT ANGIOGRAPHY CHEST WITH CONTRAST TECHNIQUE: Multidetector CT imaging of the chest was performed using the standard protocol during bolus administration of intravenous contrast. Multiplanar CT image reconstructions and MIPs were obtained to evaluate the vascular anatomy. CONTRAST:  171mL ISOVUE-370 IOPAMIDOL (ISOVUE-370) INJECTION 76% COMPARISON:  None. FINDINGS: Cardiovascular: Thoracic aorta and its branches demonstrate atherosclerotic calcifications without aneurysmal dilatation or dissection. Mild coronary calcifications are seen. No cardiac enlargement is noted. Pulmonary artery is well visualized within normal branching pattern. No filling defect to suggest pulmonary embolism is seen. Mediastinum/Nodes: The thoracic inlet is within normal limits. No significant hilar or mediastinal adenopathy is noted. The  esophagus is within normal limits. Lungs/Pleura: Mild dependent atelectatic changes are noted. Mild emphysematous changes are noted. No focal confluent infiltrate or sizable effusion is seen. No parenchymal nodules are noted. Mild peribronchial thickening is noted. Upper Abdomen: Visualized upper abdomen is unremarkable. Musculoskeletal: Degenerative changes of the thoracic spine are noted. Degenerative changes of the shoulder joints are noted bilaterally. Review of the MIP images confirms the above findings. IMPRESSION: No evidence of pulmonary emboli. Mild dependent atelectatic changes and mild peribronchial thickening. No other focal abnormality is noted. Aortic Atherosclerosis (ICD10-I70.0) and Emphysema (ICD10-J43.9). Electronically Signed   By: Inez Catalina M.D.   On: 12/06/2017 11:19  Dg Chest Port 1 View  Result Date: 12/06/2017 CLINICAL DATA:  Shortness of breath and chest pain EXAM: PORTABLE CHEST 1 VIEW COMPARISON:  03/09/2017 FINDINGS: Cardiac shadow is stable. Vascular congestion is noted with mild interstitial edema. Bibasilar atelectatic changes as well as a small left pleural effusion are seen. No bony abnormality is noted. IMPRESSION: Changes of mild CHF. Electronically Signed   By: Inez Catalina M.D.   On: 12/06/2017 09:48   Labs: Basic Metabolic Panel: Recent Labs  Lab 12/06/17 0926 12/07/17 0559 12/09/17 0558  NA 139 139 134*  K 3.7 3.3* 5.1  CL 103 105 100  CO2 28 28 25   GLUCOSE 92 81 202*  BUN 19 14 34*  CREATININE 0.70 0.65 0.79  CALCIUM 8.9 8.3* 9.5  MG 2.0  --  2.3   CBC: Recent Labs  Lab 12/06/17 0926 12/07/17 0559 12/09/17 0558  WBC 14.8* 9.4 13.4*  NEUTROABS 13.0*  --   --   HGB 12.5 10.8* 11.1*  HCT 40.4 34.7* 37.6  MCV 81.5 82.2 81.7  PLT 255 211 258   Cardiac Enzymes: Recent Labs  Lab 12/06/17 0926 12/06/17 1422 12/06/17 2228 12/07/17 0600  TROPONINI <0.03 <0.03 0.03* <0.03   BNP: BNP (last 3 results) Recent Labs    12/06/17 0926  BNP  206.0*   CBG: Recent Labs  Lab 12/09/17 1622 12/09/17 2146 12/09/17 2222 12/10/17 0744 12/10/17 1124  GLUCAP 168* 182* 197* 137* 174*   Signed:  Barton Dubois MD.  [facility] 12/10/2017, 3:32 PM

## 2017-12-14 DIAGNOSIS — Z8673 Personal history of transient ischemic attack (TIA), and cerebral infarction without residual deficits: Secondary | ICD-10-CM | POA: Diagnosis not present

## 2017-12-14 DIAGNOSIS — Z853 Personal history of malignant neoplasm of breast: Secondary | ICD-10-CM | POA: Diagnosis not present

## 2017-12-14 DIAGNOSIS — Z96653 Presence of artificial knee joint, bilateral: Secondary | ICD-10-CM | POA: Diagnosis not present

## 2017-12-14 DIAGNOSIS — Z7901 Long term (current) use of anticoagulants: Secondary | ICD-10-CM | POA: Diagnosis not present

## 2017-12-14 DIAGNOSIS — I110 Hypertensive heart disease with heart failure: Secondary | ICD-10-CM | POA: Diagnosis not present

## 2017-12-14 DIAGNOSIS — I4891 Unspecified atrial fibrillation: Secondary | ICD-10-CM | POA: Diagnosis not present

## 2017-12-14 DIAGNOSIS — J811 Chronic pulmonary edema: Secondary | ICD-10-CM | POA: Diagnosis not present

## 2017-12-14 DIAGNOSIS — E1142 Type 2 diabetes mellitus with diabetic polyneuropathy: Secondary | ICD-10-CM | POA: Diagnosis not present

## 2017-12-14 DIAGNOSIS — Z791 Long term (current) use of non-steroidal anti-inflammatories (NSAID): Secondary | ICD-10-CM | POA: Diagnosis not present

## 2017-12-14 DIAGNOSIS — J8410 Pulmonary fibrosis, unspecified: Secondary | ICD-10-CM | POA: Diagnosis not present

## 2017-12-14 DIAGNOSIS — M1991 Primary osteoarthritis, unspecified site: Secondary | ICD-10-CM | POA: Diagnosis not present

## 2017-12-14 DIAGNOSIS — I5033 Acute on chronic diastolic (congestive) heart failure: Secondary | ICD-10-CM | POA: Diagnosis not present

## 2017-12-14 DIAGNOSIS — Z87440 Personal history of urinary (tract) infections: Secondary | ICD-10-CM | POA: Diagnosis not present

## 2017-12-14 DIAGNOSIS — E785 Hyperlipidemia, unspecified: Secondary | ICD-10-CM | POA: Diagnosis not present

## 2017-12-14 DIAGNOSIS — K219 Gastro-esophageal reflux disease without esophagitis: Secondary | ICD-10-CM | POA: Diagnosis not present

## 2017-12-14 DIAGNOSIS — Z794 Long term (current) use of insulin: Secondary | ICD-10-CM | POA: Diagnosis not present

## 2017-12-22 ENCOUNTER — Telehealth: Payer: Self-pay | Admitting: *Deleted

## 2017-12-22 ENCOUNTER — Ambulatory Visit (INDEPENDENT_AMBULATORY_CARE_PROVIDER_SITE_OTHER): Payer: PPO | Admitting: Family Medicine

## 2017-12-22 ENCOUNTER — Encounter: Payer: Self-pay | Admitting: Family Medicine

## 2017-12-22 VITALS — BP 134/62 | HR 54 | Temp 98.5°F | Ht <= 58 in | Wt 159.0 lb

## 2017-12-22 DIAGNOSIS — Z09 Encounter for follow-up examination after completed treatment for conditions other than malignant neoplasm: Secondary | ICD-10-CM | POA: Diagnosis not present

## 2017-12-22 MED ORDER — POTASSIUM CHLORIDE ER 10 MEQ PO TBCR: 10.0000 meq | EXTENDED_RELEASE_TABLET | Freq: Every day | ORAL | 2 refills | Status: DC

## 2017-12-22 MED ORDER — TRAZODONE HCL 50 MG PO TABS: 50.0000 mg | ORAL_TABLET | Freq: Every evening | ORAL | 2 refills | Status: DC | PRN

## 2017-12-22 MED ORDER — AZITHROMYCIN 250 MG PO TABS: ORAL_TABLET | ORAL | 0 refills | Status: DC

## 2017-12-22 MED ORDER — FUROSEMIDE 40 MG PO TABS: 40.0000 mg | ORAL_TABLET | Freq: Every day | ORAL | 2 refills | Status: DC

## 2017-12-22 NOTE — Telephone Encounter (Signed)
[name] w/ [facility] Pt had fall over weekend abrasion on forehead & right knee, no injury Pt reluctant to report afraid son will put her in a nursing home She has an appt with you tomorrow but would not like this mentioned since he will be coming with you, but [facility] nurse thought you should know about it

## 2017-12-23 ENCOUNTER — Ambulatory Visit (INDEPENDENT_AMBULATORY_CARE_PROVIDER_SITE_OTHER): Payer: PPO

## 2017-12-23 DIAGNOSIS — I5033 Acute on chronic diastolic (congestive) heart failure: Secondary | ICD-10-CM | POA: Diagnosis not present

## 2017-12-23 DIAGNOSIS — Z853 Personal history of malignant neoplasm of breast: Secondary | ICD-10-CM | POA: Diagnosis not present

## 2017-12-23 DIAGNOSIS — J811 Chronic pulmonary edema: Secondary | ICD-10-CM

## 2017-12-23 DIAGNOSIS — E785 Hyperlipidemia, unspecified: Secondary | ICD-10-CM

## 2017-12-23 DIAGNOSIS — I110 Hypertensive heart disease with heart failure: Secondary | ICD-10-CM | POA: Diagnosis not present

## 2017-12-23 DIAGNOSIS — Z87440 Personal history of urinary (tract) infections: Secondary | ICD-10-CM

## 2017-12-23 DIAGNOSIS — Z794 Long term (current) use of insulin: Secondary | ICD-10-CM

## 2017-12-23 DIAGNOSIS — Z96653 Presence of artificial knee joint, bilateral: Secondary | ICD-10-CM

## 2017-12-23 DIAGNOSIS — Z8673 Personal history of transient ischemic attack (TIA), and cerebral infarction without residual deficits: Secondary | ICD-10-CM

## 2017-12-23 DIAGNOSIS — K219 Gastro-esophageal reflux disease without esophagitis: Secondary | ICD-10-CM

## 2017-12-23 DIAGNOSIS — Z7901 Long term (current) use of anticoagulants: Secondary | ICD-10-CM

## 2017-12-23 DIAGNOSIS — I4891 Unspecified atrial fibrillation: Secondary | ICD-10-CM | POA: Diagnosis not present

## 2017-12-23 DIAGNOSIS — E1142 Type 2 diabetes mellitus with diabetic polyneuropathy: Secondary | ICD-10-CM | POA: Diagnosis not present

## 2017-12-23 DIAGNOSIS — Z791 Long term (current) use of non-steroidal anti-inflammatories (NSAID): Secondary | ICD-10-CM

## 2017-12-23 DIAGNOSIS — M1991 Primary osteoarthritis, unspecified site: Secondary | ICD-10-CM | POA: Diagnosis not present

## 2017-12-23 DIAGNOSIS — J8410 Pulmonary fibrosis, unspecified: Secondary | ICD-10-CM

## 2017-12-23 LAB — CMP14+EGFR
ALBUMIN: 3.9 g/dL (ref 3.5–4.7)
ALK PHOS: 66 IU/L (ref 39–117)
ALT: 24 IU/L (ref 0–32)
AST: 17 IU/L (ref 0–40)
Albumin/Globulin Ratio: 1.7 (ref 1.2–2.2)
BILIRUBIN TOTAL: 0.3 mg/dL (ref 0.0–1.2)
BUN / CREAT RATIO: 19 (ref 12–28)
BUN: 19 mg/dL (ref 8–27)
CHLORIDE: 100 mmol/L (ref 96–106)
CO2: 26 mmol/L (ref 20–29)
Calcium: 9.2 mg/dL (ref 8.7–10.3)
Creatinine, Ser: 1.01 mg/dL — ABNORMAL HIGH (ref 0.57–1.00)
GFR calc non Af Amer: 49 mL/min/{1.73_m2} — ABNORMAL LOW (ref >59)
GFR, EST AFRICAN AMERICAN: 57 mL/min/{1.73_m2} — AB (ref >59)
GLOBULIN, TOTAL: 2.3 g/dL (ref 1.5–4.5)
GLUCOSE: 136 mg/dL — AB (ref 65–99)
Potassium: 4.5 mmol/L (ref 3.5–5.2)
Sodium: 140 mmol/L (ref 134–144)
Total Protein: 6.2 g/dL (ref 6.0–8.5)

## 2017-12-24 ENCOUNTER — Encounter: Payer: Self-pay | Admitting: Family Medicine

## 2017-12-24 NOTE — Progress Notes (Signed)
Subjective:  Patient ID: [name], female    DOB: 01/24/1926  Age: 84 y.o. MRN: [id number]  CC: Hospitalization Follow-up   HPI LYDIAH PONG presents for recently hospitalized for hypoxia. She has recuperated well and denies dyspnea. She has no edema.She is having some residual cough.She is taking lasix. Needs to know whether to continue.   Depression screen [facility] 2/9 12/22/2017 06/22/2017 05/20/2017  Decreased Interest 1 0 0  Down, Depressed, Hopeless 3 0 0  PHQ - 2 Score 4 0 0  Altered sleeping 3 - -  Tired, decreased energy 3 - -  Change in appetite 3 - -  Feeling bad or failure about yourself  0 - -  Trouble concentrating 0 - -  Moving slowly or fidgety/restless 3 - -  Suicidal thoughts 0 - -  PHQ-9 Score 16 - -  Difficult doing work/chores - - -  Some recent data might be hidden    History Jurni has a past medical history of Arthritis, Cancer ([place]), Cataract, Diabetes mellitus without complication ([place]), Frequent UTI, Hyperlipidemia, Hypertension, Neck pain, Scoliosis, and Stroke ([place]).   She has a past surgical history that includes Appendectomy; Cholecystectomy; Tonsillectomy; Joint replacement; Eye surgery; Replacement total knee bilateral (Bilateral); and Breast lumpectomy (Left).   Her family history includes Alcohol abuse in her brother; Arthritis in her mother; Cancer in her brother and mother; Heart disease in her father.She reports that she has never smoked. She has never used smokeless tobacco. She reports that she does not drink alcohol or use drugs.    ROS Review of Systems  Constitutional: Negative.   HENT: Negative for congestion.   Eyes: Negative for visual disturbance.  Respiratory: Positive for cough. Negative for shortness of breath.   Cardiovascular: Negative for chest pain.  Gastrointestinal: Negative for abdominal pain, constipation, diarrhea, nausea and vomiting.  Genitourinary: Negative for difficulty urinating.  Musculoskeletal: Negative  for arthralgias and myalgias.  Neurological: Negative for headaches.  Psychiatric/Behavioral: Negative for sleep disturbance.    Objective:  BP 134/62   Pulse (!) 54   Temp 98.5 F (36.9 C) (Oral)   Ht 4' 10" (1.473 m)   Wt 159 lb (72.1 kg)   BMI 33.23 kg/m   BP Readings from Last 3 Encounters:  12/22/17 134/62  12/10/17 (!) 146/73  10/30/17 134/75    Wt Readings from Last 3 Encounters:  12/22/17 159 lb (72.1 kg)  12/10/17 164 lb 3.9 oz (74.5 kg)  10/30/17 159 lb 2 oz (72.2 kg)     Physical Exam  Constitutional: She is oriented to person, place, and time. She appears well-developed and well-nourished. No distress.  HENT:  Head: Normocephalic and atraumatic.  Right Ear: External ear normal.  Left Ear: External ear normal.  Nose: Nose normal.  Mouth/Throat: Oropharynx is clear and moist.  Eyes: Pupils are equal, round, and reactive to light. Conjunctivae and EOM are normal.  Neck: Normal range of motion. Neck supple. No thyromegaly present.  Cardiovascular: Normal rate, regular rhythm and normal heart sounds.  No murmur heard. Pulmonary/Chest: Effort normal and breath sounds normal. No respiratory distress. She has no wheezes. She has no rales.  Abdominal: Soft. Bowel sounds are normal. She exhibits no distension. There is no tenderness.  Lymphadenopathy:    She has no cervical adenopathy.  Neurological: She is alert and oriented to person, place, and time. She has normal reflexes.  Skin: Skin is warm and dry.  Psychiatric: She has a normal mood and affect. Her  behavior is normal. Judgment and thought content normal.      Assessment & Plan:   Anjannette was seen today for hospitalization follow-up.  Diagnoses and all orders for this visit:  Hospital discharge follow-up -     CMP14+EGFR  Other orders -     furosemide (LASIX) 40 MG tablet; Take 1 tablet (40 mg total) by mouth daily. -     azithromycin (ZITHROMAX) 250 MG tablet; Take as directed -     traZODone  (DESYREL) 50 MG tablet; Take 1 tablet (50 mg total) by mouth at bedtime as needed for sleep. -     potassium chloride (K-DUR) 10 MEQ tablet; Take 1 tablet (10 mEq total) by mouth daily.       I have discontinued Na A. Davern's furosemide and predniSONE. I am also having her start on furosemide, azithromycin, traZODone, and potassium chloride. Additionally, I am having her maintain her Calcium Carbonate-Vit D-Min (CALCIUM 1200 PO), Vitamin D3, Cyanocobalamin (VITAMIN B 12 PO), meloxicam, nitrofurantoin (macrocrystal-monohydrate), diltiazem, gabapentin, Insulin Glargine, Insulin Pen Needle, metFORMIN, omeprazole, glucose blood, rOPINIRole, triamcinolone, apixaban, and Ipratropium-Albuterol.  Allergies as of 12/22/2017      Reactions   Diltiazem Hcl    Red itchy rash started a few hours after taking short acting 170m dilt tabs   Ace Inhibitors Cough      Medication List        Accurate as of 12/22/17 11:59 PM. Always use your most recent med list.          apixaban 5 MG Tabs tablet Commonly known as:  ELIQUIS Take 1 tablet (5 mg total) by mouth 2 (two) times daily.   azithromycin 250 MG tablet Commonly known as:  ZITHROMAX Take as directed   CALCIUM 1200 PO Take 1 tablet by mouth every other day.   diltiazem 240 MG 24 hr capsule Commonly known as:  CARDIZEM CD Take 1 capsule (240 mg total) by mouth 2 (two) times daily.   furosemide 40 MG tablet Commonly known as:  LASIX Take 1 tablet (40 mg total) by mouth daily.   gabapentin 100 MG capsule Commonly known as:  NEURONTIN Take 3 capsules (300 mg total) by mouth 2 (two) times daily.   glucose blood test strip USE TO check blood sugars twice daily   Insulin Glargine 100 UNIT/ML Solostar Pen Commonly known as:  LANTUS INJECT UP TO 26 UNITS ONCE DAILY AT 10PM   Insulin Pen Needle 31G X 8 MM Misc Use once daily with lantus solostar   Ipratropium-Albuterol 20-100 MCG/ACT Aers respimat Commonly known as:   COMBIVENT Inhale 1 puff into the lungs every 8 (eight) hours as needed for wheezing or shortness of breath.   meloxicam 15 MG tablet Commonly known as:  MOBIC Take 1 tablet (15 mg total) by mouth daily.   metFORMIN 500 MG tablet Commonly known as:  GLUCOPHAGE Take 1 Tablet by mouth once daily with BREAKFAST   nitrofurantoin (macrocrystal-monohydrate) 100 MG capsule Commonly known as:  MACROBID Take 1 capsule (100 mg total) by mouth at bedtime.   omeprazole 20 MG capsule Commonly known as:  PRILOSEC Take 1 Capsule by mouth 2 times a day BEFORE meals   potassium chloride 10 MEQ tablet Commonly known as:  K-DUR Take 1 tablet (10 mEq total) by mouth daily.   rOPINIRole 0.25 MG tablet Commonly known as:  REQUIP take 1  Tablet by mouth 3 times daily for leg cramps   traZODone 50 MG tablet Commonly known as:  DESYREL Take 1 tablet (50 mg total) by mouth at bedtime as needed for sleep.   triamcinolone 0.025 % ointment Commonly known as:  KENALOG Apply 1 application topically 2 (two) times daily.   VITAMIN B 12 PO Take 1 tablet by mouth daily.   Vitamin D3 5000 units Caps Take 2,000 Units by mouth daily.        Follow-up: Return in about 3 months (around 03/24/2018).  Claretta Fraise, M.D.

## 2017-12-31 DIAGNOSIS — J811 Chronic pulmonary edema: Secondary | ICD-10-CM | POA: Diagnosis not present

## 2017-12-31 DIAGNOSIS — J8410 Pulmonary fibrosis, unspecified: Secondary | ICD-10-CM | POA: Diagnosis not present

## 2017-12-31 DIAGNOSIS — K219 Gastro-esophageal reflux disease without esophagitis: Secondary | ICD-10-CM | POA: Diagnosis not present

## 2017-12-31 DIAGNOSIS — Z7901 Long term (current) use of anticoagulants: Secondary | ICD-10-CM | POA: Diagnosis not present

## 2017-12-31 DIAGNOSIS — Z87440 Personal history of urinary (tract) infections: Secondary | ICD-10-CM | POA: Diagnosis not present

## 2017-12-31 DIAGNOSIS — Z794 Long term (current) use of insulin: Secondary | ICD-10-CM | POA: Diagnosis not present

## 2017-12-31 DIAGNOSIS — Z96653 Presence of artificial knee joint, bilateral: Secondary | ICD-10-CM | POA: Diagnosis not present

## 2017-12-31 DIAGNOSIS — M1991 Primary osteoarthritis, unspecified site: Secondary | ICD-10-CM | POA: Diagnosis not present

## 2017-12-31 DIAGNOSIS — Z853 Personal history of malignant neoplasm of breast: Secondary | ICD-10-CM | POA: Diagnosis not present

## 2017-12-31 DIAGNOSIS — Z791 Long term (current) use of non-steroidal anti-inflammatories (NSAID): Secondary | ICD-10-CM | POA: Diagnosis not present

## 2017-12-31 DIAGNOSIS — I4891 Unspecified atrial fibrillation: Secondary | ICD-10-CM | POA: Diagnosis not present

## 2017-12-31 DIAGNOSIS — E1142 Type 2 diabetes mellitus with diabetic polyneuropathy: Secondary | ICD-10-CM | POA: Diagnosis not present

## 2017-12-31 DIAGNOSIS — Z8673 Personal history of transient ischemic attack (TIA), and cerebral infarction without residual deficits: Secondary | ICD-10-CM | POA: Diagnosis not present

## 2017-12-31 DIAGNOSIS — I110 Hypertensive heart disease with heart failure: Secondary | ICD-10-CM | POA: Diagnosis not present

## 2017-12-31 DIAGNOSIS — I5033 Acute on chronic diastolic (congestive) heart failure: Secondary | ICD-10-CM | POA: Diagnosis not present

## 2017-12-31 DIAGNOSIS — E785 Hyperlipidemia, unspecified: Secondary | ICD-10-CM | POA: Diagnosis not present

## 2018-01-12 ENCOUNTER — Telehealth: Payer: Self-pay | Admitting: *Deleted

## 2018-01-14 NOTE — Telephone Encounter (Signed)
Samples to front for pt pick up

## 2018-01-29 ENCOUNTER — Encounter: Payer: Self-pay | Admitting: Family Medicine

## 2018-01-29 ENCOUNTER — Ambulatory Visit (INDEPENDENT_AMBULATORY_CARE_PROVIDER_SITE_OTHER): Payer: PPO | Admitting: Family Medicine

## 2018-01-29 VITALS — BP 124/64 | HR 60 | Temp 98.3°F | Ht <= 58 in | Wt 156.2 lb

## 2018-01-29 DIAGNOSIS — I10 Essential (primary) hypertension: Secondary | ICD-10-CM

## 2018-01-29 DIAGNOSIS — Z23 Encounter for immunization: Secondary | ICD-10-CM | POA: Diagnosis not present

## 2018-01-29 DIAGNOSIS — R609 Edema, unspecified: Secondary | ICD-10-CM

## 2018-01-29 DIAGNOSIS — E1142 Type 2 diabetes mellitus with diabetic polyneuropathy: Secondary | ICD-10-CM

## 2018-01-29 LAB — BAYER DCA HB A1C WAIVED: HB A1C (BAYER DCA - WAIVED): 6.3 % (ref <7.0)

## 2018-01-29 MED ORDER — INSULIN GLARGINE 100 UNIT/ML SOLOSTAR PEN: PEN_INJECTOR | SUBCUTANEOUS | 1 refills | Status: DC

## 2018-01-29 NOTE — Progress Notes (Signed)
Subjective:  Patient ID: [name], female    DOB: 04-23-1926  Age: 84 y.o. MRN: [id number]  CC: Medical Management of Chronic Issues   HPI LATRISH MOGEL presents forFollow-up of diabetes. Patient checks blood sugar at home.  Log attached Patient denies symptoms such as polyuria, polydipsia, excessive hunger, nausea No significant hypoglycemic spells noted. Medications reviewed. Pt reports taking them regularly without complication/adverse reaction being reported today.  A few elevated BP on her log systolic to 409, but most are under 140.  History Jenin has a past medical history of Arthritis, Cancer ([place]), Cataract, Diabetes mellitus without complication ([place]), Frequent UTI, Hyperlipidemia, Hypertension, Neck pain, Scoliosis, and Stroke ([place]).   She has a past surgical history that includes Appendectomy; Cholecystectomy; Tonsillectomy; Joint replacement; Eye surgery; Replacement total knee bilateral (Bilateral); and Breast lumpectomy (Left).   Her family history includes Alcohol abuse in her brother; Arthritis in her mother; Cancer in her brother and mother; Heart disease in her father.She reports that she has never smoked. She has never used smokeless tobacco. She reports that she does not drink alcohol or use drugs.  Current Outpatient Medications on File Prior to Visit  Medication Sig Dispense Refill  . apixaban (ELIQUIS) 5 MG TABS tablet Take 1 tablet (5 mg total) by mouth 2 (two) times daily. 60 tablet 1  . azithromycin (ZITHROMAX) 250 MG tablet Take as directed 6 tablet 0  . Calcium Carbonate-Vit D-Min (CALCIUM 1200 PO) Take 1 tablet by mouth every other day.     . Cholecalciferol (VITAMIN D3) 5000 units CAPS Take 2,000 Units by mouth daily.     . Cyanocobalamin (VITAMIN B 12 PO) Take 1 tablet by mouth daily.    Marland Kitchen diltiazem (DILTIAZEM CD) 240 MG 24 hr capsule Take 1 capsule (240 mg total) by mouth 2 (two) times daily. (Patient taking differently: Take 180-240 mg by mouth  See admin instructions. 180 mg in the am and 240 mg in the evening) 180 capsule 1  . furosemide (LASIX) 40 MG tablet Take 1 tablet (40 mg total) by mouth daily. 30 tablet 2  . gabapentin (NEURONTIN) 100 MG capsule Take 3 capsules (300 mg total) by mouth 2 (two) times daily. 540 capsule 1  . glucose blood (ONE TOUCH ULTRA TEST) test strip USE TO check blood sugars twice daily 100 each 12  . Insulin Pen Needle 31G X 8 MM MISC Use once daily with lantus solostar 100 each 6  . meloxicam (MOBIC) 15 MG tablet Take 1 tablet (15 mg total) by mouth daily. 90 tablet 1  . metFORMIN (GLUCOPHAGE) 500 MG tablet Take 1 Tablet by mouth once daily with BREAKFAST 90 tablet 1  . nitrofurantoin, macrocrystal-monohydrate, (MACROBID) 100 MG capsule Take 1 capsule (100 mg total) by mouth at bedtime. 30 capsule 5  . omeprazole (PRILOSEC) 20 MG capsule Take 1 Capsule by mouth 2 times a day BEFORE meals 180 capsule 1  . potassium chloride (K-DUR) 10 MEQ tablet Take 1 tablet (10 mEq total) by mouth daily. 30 tablet 2  . rOPINIRole (REQUIP) 0.25 MG tablet take 1  Tablet by mouth 3 times daily for leg cramps (Patient taking differently: Take 0.25 mg by mouth 2 (two) times daily. take 1  Tablet by mouth 3 times daily for leg cramps) 90 tablet 1  . traZODone (DESYREL) 50 MG tablet Take 1 tablet (50 mg total) by mouth at bedtime as needed for sleep. 30 tablet 2  . triamcinolone (KENALOG) 0.025 % ointment Apply  1 application topically 2 (two) times daily. (Patient taking differently: Apply 1 application topically daily. ) 60 g 2   No current facility-administered medications on file prior to visit.     ROS Review of Systems  Constitutional: Negative.   HENT: Negative for congestion.   Eyes: Negative for visual disturbance.  Respiratory: Negative for shortness of breath (would like to DC combivent - copay is $90 and she is not dyspneic).   Cardiovascular: Positive for leg swelling (increases through the day. Goes down at  night.). Negative for chest pain.  Gastrointestinal: Negative for abdominal pain, constipation, diarrhea, nausea and vomiting.  Genitourinary: Negative for difficulty urinating.  Musculoskeletal: Negative for arthralgias and myalgias.  Neurological: Negative for headaches.  Psychiatric/Behavioral: Negative for sleep disturbance.    Objective:  BP 124/64   Pulse 60   Temp 98.3 F (36.8 C) (Oral)   Ht '4\' 10"'$  (1.473 m)   Wt 156 lb 4 oz (70.9 kg)   BMI 32.66 kg/m   BP Readings from Last 3 Encounters:  01/29/18 124/64  12/22/17 134/62  12/10/17 (!) 146/73    Wt Readings from Last 3 Encounters:  01/29/18 156 lb 4 oz (70.9 kg)  12/22/17 159 lb (72.1 kg)  12/10/17 164 lb 3.9 oz (74.5 kg)     Physical Exam  Constitutional: She is oriented to person, place, and time. She appears well-developed and well-nourished. No distress.  HENT:  Head: Normocephalic and atraumatic.  Right Ear: External ear normal.  Left Ear: External ear normal.  Nose: Nose normal.  Mouth/Throat: Oropharynx is clear and moist.  Eyes: Pupils are equal, round, and reactive to light. Conjunctivae and EOM are normal.  Neck: Normal range of motion. Neck supple. No thyromegaly present.  Cardiovascular: Normal rate, regular rhythm and normal heart sounds.  No murmur heard. Pulmonary/Chest: Effort normal and breath sounds normal. No respiratory distress. She has no wheezes. She has no rales.  Abdominal: Soft. Bowel sounds are normal. She exhibits no distension. There is no tenderness.  Musculoskeletal: She exhibits edema (2+ at ankles). She exhibits no tenderness or deformity.  Lymphadenopathy:    She has no cervical adenopathy.  Neurological: She is alert and oriented to person, place, and time. She has normal reflexes.  Skin: Skin is warm and dry.  Psychiatric: She has a normal mood and affect. Her behavior is normal. Judgment and thought content normal.      Assessment & Plan:   Tahtiana was seen today for  medical management of chronic issues.  Diagnoses and all orders for this visit:  Diabetic polyneuropathy associated with type 2 diabetes mellitus ([place]) -     CMP14+EGFR -     Bayer [place] Hb A1c Waived  Encounter for immunization -     Flu vaccine HIGH DOSE PF  Fluid retention -     Brain natriuretic peptide  Essential hypertension  Other orders -     Insulin Glargine (LANTUS SOLOSTAR) 100 UNIT/ML Solostar Pen; INJECT 50 UNITS ONCE DAILY AT 10PM      I have discontinued Arianis A. Paterson's Ipratropium-Albuterol. I have also changed her Insulin Glargine. Additionally, I am having her maintain her Calcium Carbonate-Vit D-Min (CALCIUM 1200 PO), Vitamin D3, Cyanocobalamin (VITAMIN B 12 PO), meloxicam, nitrofurantoin (macrocrystal-monohydrate), diltiazem, gabapentin, Insulin Pen Needle, metFORMIN, omeprazole, glucose blood, rOPINIRole, triamcinolone, apixaban, furosemide, azithromycin, traZODone, and potassium chloride.  Meds ordered this encounter  Medications  . Insulin Glargine (LANTUS SOLOSTAR) 100 UNIT/ML Solostar Pen    Sig: INJECT 50 UNITS  ONCE DAILY AT 10PM    Dispense:  15 mL    Refill:  1     Follow-up: Return in about 6 weeks (around 03/12/2018).  Claretta Fraise, M.D.

## 2018-01-30 LAB — CMP14+EGFR
A/G RATIO: 1.8 (ref 1.2–2.2)
ALT: 21 IU/L (ref 0–32)
AST: 18 IU/L (ref 0–40)
Albumin: 4.2 g/dL (ref 3.2–4.6)
Alkaline Phosphatase: 75 IU/L (ref 39–117)
BILIRUBIN TOTAL: 0.3 mg/dL (ref 0.0–1.2)
BUN/Creatinine Ratio: 26 (ref 12–28)
BUN: 28 mg/dL (ref 10–36)
CHLORIDE: 99 mmol/L (ref 96–106)
CO2: 26 mmol/L (ref 20–29)
Calcium: 9.9 mg/dL (ref 8.7–10.3)
Creatinine, Ser: 1.06 mg/dL — ABNORMAL HIGH (ref 0.57–1.00)
GFR calc Af Amer: 53 mL/min/{1.73_m2} — ABNORMAL LOW (ref >59)
GFR calc non Af Amer: 46 mL/min/{1.73_m2} — ABNORMAL LOW (ref >59)
GLUCOSE: 73 mg/dL (ref 65–99)
Globulin, Total: 2.4 g/dL (ref 1.5–4.5)
POTASSIUM: 5.6 mmol/L — AB (ref 3.5–5.2)
Sodium: 140 mmol/L (ref 134–144)
TOTAL PROTEIN: 6.6 g/dL (ref 6.0–8.5)

## 2018-01-30 LAB — BRAIN NATRIURETIC PEPTIDE: BNP: 192.2 pg/mL — ABNORMAL HIGH (ref 0.0–100.0)

## 2018-02-01 ENCOUNTER — Ambulatory Visit: Payer: PPO | Admitting: Family Medicine

## 2018-02-17 ENCOUNTER — Other Ambulatory Visit: Payer: Self-pay | Admitting: *Deleted

## 2018-02-17 ENCOUNTER — Ambulatory Visit (INDEPENDENT_AMBULATORY_CARE_PROVIDER_SITE_OTHER): Payer: PPO | Admitting: Pediatrics

## 2018-02-17 ENCOUNTER — Encounter: Payer: Self-pay | Admitting: Pediatrics

## 2018-02-17 VITALS — BP 140/73 | HR 63 | Temp 96.9°F | Ht <= 58 in | Wt 157.2 lb

## 2018-02-17 DIAGNOSIS — R300 Dysuria: Secondary | ICD-10-CM | POA: Diagnosis not present

## 2018-02-17 DIAGNOSIS — N3090 Cystitis, unspecified without hematuria: Secondary | ICD-10-CM | POA: Diagnosis not present

## 2018-02-17 LAB — URINALYSIS
Bilirubin, UA: NEGATIVE
Glucose, UA: NEGATIVE
KETONES UA: NEGATIVE
Nitrite, UA: POSITIVE — AB
PH UA: 8.5 — AB (ref 5.0–7.5)
SPEC GRAV UA: 1.015 (ref 1.005–1.030)
Urobilinogen, Ur: 0.2 mg/dL (ref 0.2–1.0)

## 2018-02-17 MED ORDER — CEFDINIR 300 MG PO CAPS: 300.0000 mg | ORAL_CAPSULE | Freq: Two times a day (BID) | ORAL | 0 refills | Status: DC

## 2018-02-17 NOTE — Progress Notes (Signed)
  Subjective:   Patient ID: [name], female    DOB: 16-Dec-1925, 84 y.o.   MRN: [id number] CC: Urinary Tract Infection (strong urine odor with discoloration)  HPI: [name] is a 84 y.o. female   Cloudy urine with a smell starting 1 week ago.  No fevers.  Appetite has been okay.  Taking nitrofurantoin daily for prophylaxis.  Has been working to prevent urinary tract infections were every year.  Relevant past medical, surgical, family and social history reviewed. Allergies and medications reviewed and updated. Social History   Tobacco Use  Smoking Status Never Smoker  Smokeless Tobacco Never Used   ROS: Per HPI   Objective:    BP 140/73   Pulse 63   Temp (!) 96.9 F (36.1 C) (Oral)   Ht 4\' 10"  (1.473 m)   Wt 157 lb 3.2 oz (71.3 kg)   BMI 32.85 kg/m   Wt Readings from Last 3 Encounters:  02/17/18 157 lb 3.2 oz (71.3 kg)  01/29/18 156 lb 4 oz (70.9 kg)  12/22/17 159 lb (72.1 kg)    Gen: NAD, alert, cooperative with exam, NCAT EYES: EOMI, no conjunctival injection, or no icterus CV: NRRR, normal S1/S2, no murmur, distal pulses 2+ b/l Resp: CTABL, no wheezes, normal WOB Abd: +BS, soft, NTND. no guarding or organomegaly Ext: 1+ edema, warm Neuro: Alert and oriented MSK: normal muscle bulk  Assessment & Plan:  [name] was seen today for urinary tract infection.  Diagnoses and all orders for this visit:  Cystitis UA with 3+ leukocyte esterase, nitrite positive, blood positive, protein positive. Has been taking nitrofurantoin, some resistance in the past.  Will treat with below.  Follow-up urine culture.  Stop abx if culture negative.  Stop nitrofurantoin while on below.  Then okay to restart.  Follow-up with PCP as scheduled in 3 weeks. -     cefdinir (OMNICEF) 300 MG capsule; Take 1 capsule (300 mg total) by mouth 2 (two) times daily. 1 po BID  Dysuria -     Urinalysis -     Urine Culture   Follow up plan: Return in about 3 weeks (around 03/10/2018). Assunta Found, MD [place]

## 2018-02-23 LAB — URINE CULTURE

## 2018-02-24 ENCOUNTER — Other Ambulatory Visit: Payer: Self-pay | Admitting: Family Medicine

## 2018-02-26 ENCOUNTER — Other Ambulatory Visit: Payer: Self-pay | Admitting: Family Medicine

## 2018-02-26 ENCOUNTER — Telehealth: Payer: Self-pay

## 2018-02-26 DIAGNOSIS — N3090 Cystitis, unspecified without hematuria: Secondary | ICD-10-CM

## 2018-02-26 MED ORDER — CIPROFLOXACIN HCL 500 MG PO TABS: 500.0000 mg | ORAL_TABLET | Freq: Two times a day (BID) | ORAL | 0 refills | Status: AC

## 2018-02-26 NOTE — Telephone Encounter (Signed)
Per Monia Pouch, I advised patient that Cipro had been sent to pharmacy based on her urine culture.  Patient voices understanding and will complete antibiotic.

## 2018-02-26 NOTE — Progress Notes (Signed)
Urine culture positive for Providencia rettgeri. This is susceptible to Cipro. Will change antibiotic therapy accordingly.

## 2018-03-01 ENCOUNTER — Other Ambulatory Visit: Payer: Self-pay | Admitting: Family Medicine

## 2018-03-01 ENCOUNTER — Telehealth: Payer: Self-pay | Admitting: *Deleted

## 2018-03-01 DIAGNOSIS — R110 Nausea: Secondary | ICD-10-CM

## 2018-03-01 MED ORDER — ONDANSETRON HCL 4 MG PO TABS: 4.0000 mg | ORAL_TABLET | Freq: Three times a day (TID) | ORAL | 0 refills | Status: DC | PRN

## 2018-03-01 NOTE — Telephone Encounter (Signed)
Pt called to inform Cipro is causing severe nausea Pt states she has taken lower milligram of Cipro with no problem Can RX be changed either to lower strength or different antibiotic Please review and advise

## 2018-03-01 NOTE — Telephone Encounter (Signed)
I will call in zofran.

## 2018-03-01 NOTE — Telephone Encounter (Signed)
Pt notified of recommendation Verbalizes understanding 

## 2018-03-01 NOTE — Progress Notes (Signed)
Nausea associated with Cipro. Zofran prescribed.

## 2018-03-08 ENCOUNTER — Other Ambulatory Visit: Payer: PPO

## 2018-03-08 DIAGNOSIS — N3090 Cystitis, unspecified without hematuria: Secondary | ICD-10-CM | POA: Diagnosis not present

## 2018-03-08 LAB — URINALYSIS, ROUTINE W REFLEX MICROSCOPIC
Bilirubin, UA: NEGATIVE
GLUCOSE, UA: NEGATIVE
Ketones, UA: NEGATIVE
Leukocytes, UA: NEGATIVE
NITRITE UA: NEGATIVE
PH UA: 8 — AB (ref 5.0–7.5)
Protein, UA: NEGATIVE
RBC UA: NEGATIVE
Specific Gravity, UA: 1.015 (ref 1.005–1.030)
UUROB: 0.2 mg/dL (ref 0.2–1.0)

## 2018-03-08 LAB — MICROSCOPIC EXAMINATION
BACTERIA UA: NONE SEEN
Epithelial Cells (non renal): NONE SEEN /hpf (ref 0–10)
RBC MICROSCOPIC, UA: NONE SEEN /HPF (ref 0–2)
RENAL EPITHEL UA: NONE SEEN /HPF
WBC UA: NONE SEEN /HPF (ref 0–5)

## 2018-03-08 NOTE — Progress Notes (Signed)
Hello Nevaen,  Your lab result is normal.Some minor variations that are not significant are commonly marked abnormal, but do not represent any medical problem for you.  Best regards, Claretta Fraise, M.D.

## 2018-03-09 ENCOUNTER — Ambulatory Visit: Payer: PPO | Admitting: Family Medicine

## 2018-03-09 LAB — URINE CULTURE

## 2018-03-30 ENCOUNTER — Telehealth: Payer: Self-pay | Admitting: *Deleted

## 2018-03-30 MED ORDER — FUROSEMIDE 40 MG PO TABS: 40.0000 mg | ORAL_TABLET | Freq: Every day | ORAL | 0 refills | Status: DC

## 2018-03-30 NOTE — Telephone Encounter (Signed)
TC from [place] Pt out of Lasix Refill sent to pharmacy, pt has appt on 04/16/18

## 2018-04-16 ENCOUNTER — Ambulatory Visit (INDEPENDENT_AMBULATORY_CARE_PROVIDER_SITE_OTHER): Payer: PPO | Admitting: Family Medicine

## 2018-04-16 VITALS — BP 150/62 | HR 67 | Temp 97.3°F | Ht <= 58 in | Wt 156.0 lb

## 2018-04-16 DIAGNOSIS — I10 Essential (primary) hypertension: Secondary | ICD-10-CM | POA: Diagnosis not present

## 2018-04-16 DIAGNOSIS — E1142 Type 2 diabetes mellitus with diabetic polyneuropathy: Secondary | ICD-10-CM

## 2018-04-16 MED ORDER — GABAPENTIN 600 MG PO TABS: 600.0000 mg | ORAL_TABLET | Freq: Every day | ORAL | 2 refills | Status: DC

## 2018-04-16 MED ORDER — NABUMETONE 500 MG PO TABS: 500.0000 mg | ORAL_TABLET | Freq: Two times a day (BID) | ORAL | 2 refills | Status: DC

## 2018-04-16 NOTE — Progress Notes (Signed)
Subjective:  Patient ID: [name], female    DOB: April 12, 1926  Age: 84 y.o. MRN: [id number]  CC: No chief complaint on file.   HPI [name] presents for follow-up of diabetes. Patient does check blood sugar at home.  She brings in extensive logs showing her sugar running between 90 and 125 fasting.  In the evening it is between 130 and 150 mostly with a couple of outliers noted 170 and 184. Patient denies symptoms such as polyuria, polydipsia, excessive hunger, nausea No significant hypoglycemic spells noted. Medications as noted below. Taking them regularly without complication/adverse reaction being reported today.  Patient also is in for evaluation of blood pressure her log includes blood pressure readings.  Those show readings as low as 87/59 a few days ago and as high as 153/80 earlier today.  On December 3 she noted a blood pressure up to 204/94.  Logs reviewed, sent to be scanned for attachment  Depression screen [facility] 2/9 04/16/2018 01/29/2018 12/22/2017  Decreased Interest 0 0 1  Down, Depressed, Hopeless 0 0 3  PHQ - 2 Score 0 0 4  Altered sleeping 3 - 3  Tired, decreased energy 3 - 3  Change in appetite 0 - 3  Feeling bad or failure about yourself  0 - 0  Trouble concentrating 0 - 0  Moving slowly or fidgety/restless 0 - 3  Suicidal thoughts 0 - 0  PHQ-9 Score 6 - 16  Difficult doing work/chores Not difficult at all - -  Some recent data might be hidden    History Ryiah has a past medical history of Arthritis, Cancer ([place]), Cataract, Diabetes mellitus without complication ([place]), Frequent UTI, Hyperlipidemia, Hypertension, Neck pain, Scoliosis, and Stroke ([place]).   She has a past surgical history that includes Appendectomy; Cholecystectomy; Tonsillectomy; Joint replacement; Eye surgery; Replacement total knee bilateral (Bilateral); and Breast lumpectomy (Left).   Her family history includes Alcohol abuse in her brother; Arthritis in her mother; Cancer in her  brother and mother; Heart disease in her father.She reports that she has never smoked. She has never used smokeless tobacco. She reports that she does not drink alcohol or use drugs.    ROS Review of Systems  Constitutional: Negative.   HENT: Negative for congestion.   Eyes: Negative for visual disturbance.  Respiratory: Negative for shortness of breath.   Cardiovascular: Negative for chest pain.  Gastrointestinal: Negative for abdominal pain, constipation, diarrhea, nausea and vomiting.  Genitourinary: Negative for difficulty urinating.  Musculoskeletal: Negative for arthralgias and myalgias.  Neurological: Negative for headaches.       Persistent burning sensation in the legs from her diabetic neuropathy.  Psychiatric/Behavioral: Negative for sleep disturbance.    Objective:  BP (!) 150/62   Pulse 67   Temp (!) 97.3 F (36.3 C) (Oral)   Ht 4\' 10"  (1.473 m)   Wt 156 lb (70.8 kg)   BMI 32.60 kg/m   BP Readings from Last 3 Encounters:  04/16/18 (!) 150/62  02/17/18 140/73  01/29/18 124/64    Wt Readings from Last 3 Encounters:  04/16/18 156 lb (70.8 kg)  02/17/18 157 lb 3.2 oz (71.3 kg)  01/29/18 156 lb 4 oz (70.9 kg)     Physical Exam Constitutional:      General: She is not in acute distress.    Appearance: She is well-developed.  HENT:     Head: Normocephalic and atraumatic.     Right Ear: External ear normal.     Left  Ear: External ear normal.     Nose: Nose normal.  Eyes:     Conjunctiva/sclera: Conjunctivae normal.     Pupils: Pupils are equal, round, and reactive to light.  Neck:     Musculoskeletal: Normal range of motion and neck supple.     Thyroid: No thyromegaly.  Cardiovascular:     Rate and Rhythm: Normal rate and regular rhythm.     Heart sounds: Normal heart sounds. No murmur.  Pulmonary:     Effort: Pulmonary effort is normal. No respiratory distress.     Breath sounds: Normal breath sounds. No wheezing or rales.  Abdominal:     General:  Bowel sounds are normal. There is no distension.     Palpations: Abdomen is soft.     Tenderness: There is no abdominal tenderness.  Lymphadenopathy:     Cervical: No cervical adenopathy.  Skin:    General: Skin is warm and dry.  Neurological:     Mental Status: She is alert and oriented to person, place, and time.     Deep Tendon Reflexes: Reflexes are normal and symmetric.  Psychiatric:        Behavior: Behavior normal.        Thought Content: Thought content normal.        Judgment: Judgment normal.       Assessment & Plan:   Diagnoses and all orders for this visit:  Essential hypertension  Diabetic polyneuropathy associated with type 2 diabetes mellitus ([place])  Other orders -     gabapentin (NEURONTIN) 600 MG tablet; Take 1 tablet (600 mg total) by mouth at bedtime. For pain, and for sleep -     nabumetone (RELAFEN) 500 MG tablet; Take 1 tablet (500 mg total) by mouth 2 (two) times daily. For muscle and joint pain       I have discontinued Glendy A. Tenpas's Cyanocobalamin (VITAMIN B 12 PO), gabapentin, traZODone, potassium chloride, cefdinir, meloxicam, ondansetron, and furosemide. I am also having her start on gabapentin and nabumetone. Additionally, I am having her maintain her Calcium Carbonate-Vit D-Min (CALCIUM 1200 PO), Vitamin D3, nitrofurantoin (macrocrystal-monohydrate), diltiazem, Insulin Pen Needle, metFORMIN, omeprazole, glucose blood, rOPINIRole, triamcinolone, apixaban, and Insulin Glargine.  Allergies as of 04/16/2018      Reactions   Diltiazem Hcl    Red itchy rash started a few hours after taking short acting 120mg  dilt tabs   Ace Inhibitors Cough      Medication List       Accurate as of April 16, 2018 11:59 PM. Always use your most recent med list.        apixaban 5 MG Tabs tablet Commonly known as:  ELIQUIS Take 1 tablet (5 mg total) by mouth 2 (two) times daily.   CALCIUM 1200 PO Take 1 tablet by mouth every other day.     diltiazem 240 MG 24 hr capsule Commonly known as:  DILTIAZEM CD Take 1 capsule (240 mg total) by mouth 2 (two) times daily.   gabapentin 600 MG tablet Commonly known as:  NEURONTIN Take 1 tablet (600 mg total) by mouth at bedtime. For pain, and for sleep   glucose blood test strip Commonly known as:  ONE TOUCH ULTRA TEST USE TO check blood sugars twice daily   Insulin Glargine 100 UNIT/ML Solostar Pen Commonly known as:  LANTUS SOLOSTAR INJECT 50 UNITS ONCE DAILY AT 10PM   Insulin Pen Needle 31G X 8 MM Misc Use once daily with lantus solostar   metFORMIN  500 MG tablet Commonly known as:  GLUCOPHAGE Take 1 Tablet by mouth once daily with BREAKFAST   nabumetone 500 MG tablet Commonly known as:  RELAFEN Take 1 tablet (500 mg total) by mouth 2 (two) times daily. For muscle and joint pain   nitrofurantoin (macrocrystal-monohydrate) 100 MG capsule Commonly known as:  MACROBID Take 1 capsule (100 mg total) by mouth at bedtime.   omeprazole 20 MG capsule Commonly known as:  PRILOSEC Take 1 Capsule by mouth 2 times a day BEFORE meals   rOPINIRole 0.25 MG tablet Commonly known as:  REQUIP take 1  Tablet by mouth 3 times daily for leg cramps   triamcinolone 0.025 % ointment Commonly known as:  KENALOG Apply 1 application topically 2 (two) times daily.   Vitamin D3 125 MCG (5000 UT) Caps Take 2,000 Units by mouth daily.        Follow-up: Return in about 3 months (around 07/16/2018) for diabetes, hypertension.  Claretta Fraise, M.D.

## 2018-04-19 ENCOUNTER — Encounter: Payer: Self-pay | Admitting: Family Medicine

## 2018-04-22 ENCOUNTER — Encounter: Payer: Self-pay | Admitting: Family Medicine

## 2018-04-22 ENCOUNTER — Ambulatory Visit (INDEPENDENT_AMBULATORY_CARE_PROVIDER_SITE_OTHER): Payer: PPO | Admitting: Family Medicine

## 2018-04-22 VITALS — BP 164/80 | HR 65 | Temp 97.2°F | Ht <= 58 in | Wt 156.0 lb

## 2018-04-22 DIAGNOSIS — Z7689 Persons encountering health services in other specified circumstances: Secondary | ICD-10-CM | POA: Diagnosis not present

## 2018-04-22 DIAGNOSIS — M5136 Other intervertebral disc degeneration, lumbar region: Secondary | ICD-10-CM | POA: Diagnosis not present

## 2018-04-22 DIAGNOSIS — I4891 Unspecified atrial fibrillation: Secondary | ICD-10-CM

## 2018-04-22 DIAGNOSIS — R5383 Other fatigue: Secondary | ICD-10-CM | POA: Diagnosis not present

## 2018-04-22 NOTE — Patient Instructions (Signed)
Atrial Fibrillation  Atrial fibrillation is a type of heartbeat that is irregular or fast (rapid). If you have this condition, your heart beats without any order. This makes it hard for your heart to pump blood in a normal way. Having this condition gives you more risk for stroke, heart failure, and other heart problems. Atrial fibrillation may start all of a sudden and then stop on its own, or it may become a long-lasting problem. What are the causes? This condition may be caused by heart conditions, such as:  High blood pressure.  Heart failure.  Heart valve disease.  Heart surgery. Other causes include:  Pneumonia.  Obstructive sleep apnea.  Lung cancer.  Thyroid disease.  Drinking too much alcohol. Sometimes the cause is not known. What increases the risk? You are more likely to develop this condition if:  You smoke.  You are older.  You have diabetes.  You are overweight.  You have a family history of this condition.  You exercise often and hard. What are the signs or symptoms? Common symptoms of this condition include:  A feeling like your heart is beating very fast.  Chest pain.  Feeling short of breath.  Feeling light-headed or weak.  Getting tired easily. Follow these instructions at home: Medicines  Take over-the-counter and prescription medicines only as told by your doctor.  If your doctor gives you a blood-thinning medicine, take it exactly as told. Taking too much of it can cause bleeding. Taking too little of it does not protect you against clots. Clots can cause a stroke. Lifestyle      Do not use any tobacco products. These include cigarettes, chewing tobacco, and e-cigarettes. If you need help quitting, ask your doctor.  Do not drink alcohol.  Do not drink beverages that have caffeine. These include coffee, soda, and tea.  Follow diet instructions as told by your doctor.  Exercise regularly as told by your doctor. General  instructions  If you have a condition that causes breathing to stop for a short period of time (apnea), treat it as told by your doctor.  Keep a healthy weight. Do not use diet pills unless your doctor says they are safe for you. Diet pills may make heart problems worse.  Keep all follow-up visits as told by your doctor. This is important. Contact a doctor if:  You notice a change in the speed, rhythm, or strength of your heartbeat.  You are taking a blood-thinning medicine and you see more bruising.  You get tired more easily when you move or exercise.  You have a sudden change in weight. Get help right away if:   You have pain in your chest or your belly (abdomen).  You have trouble breathing.  You have blood in your vomit, poop, or pee (urine).  You have any signs of a stroke. "BE FAST" is an easy way to remember the main warning signs: ? B - Balance. Signs are dizziness, sudden trouble walking, or loss of balance. ? E - Eyes. Signs are trouble seeing or a change in how you see. ? F - Face. Signs are sudden weakness or loss of feeling in the face, or the face or eyelid drooping on one side. ? A - Arms. Signs are weakness or loss of feeling in an arm. This happens suddenly and usually on one side of the body. ? S - Speech. Signs are sudden trouble speaking, slurred speech, or trouble understanding what people say. ? T - Time.  or loss of feeling in the face, or the face or eyelid drooping on one side.  ? A - Arms. Signs are weakness or loss of feeling in an arm. This happens suddenly and usually on one side of the body.  ? S - Speech. Signs are sudden trouble speaking, slurred speech, or trouble understanding what people say.  ? T - Time. Time to call emergency services. Write down what time symptoms started.  · You have other signs of a stroke, such as:  ? A sudden, very bad headache with no known cause.  ? Feeling sick to your stomach (nausea).  ? Throwing up (vomiting).  ? Jerky movements you cannot control (seizure).  These symptoms may be an emergency. Do not wait to see if the symptoms will go away. Get medical help right away. Call your local emergency services (911 in the [place] Do not drive yourself to the hospital.  Summary  · Atrial fibrillation is a type of heartbeat that is irregular  or fast (rapid).  · You are at higher risk of this condition if you smoke, are older, have diabetes, or are overweight.  · Follow your doctor's instructions about medicines, diet, exercise, and follow-up visits.  · Get help right away if you think that you have signs of a stroke.  This information is not intended to replace advice given to you by your health care provider. Make sure you discuss any questions you have with your health care provider.  Document Released: 01/29/2008 Document Revised: 06/12/2017 Document Reviewed: 06/12/2017  [employer]vier Interactive Patient Education © 2019 [employer]

## 2018-04-22 NOTE — Progress Notes (Signed)
Subjective:    Patient ID: [name], female    DOB: Dec 24, 1925, 84 y.o.   MRN: [id number]  Chief Complaint:  Cardiology referral and Discuss medications   HPI: [name] is a 84 y.o. female presenting on 04/22/2018 for Cardiology referral and Discuss medications  Pt presents today to establish care with provider. Pt states she has been doing fairly well overall. States she was recently diagnosed with atrial fibrillation. States she was placed on Cardizem while in the ED but this was discontinued by Dr. Livia Snellen on 04/16/18. Pt denies cheat pain, shortness of breath, increased leg swelling, or orthopnea. States she has noticed intermittent palpitations, states this only lasts for a few seconds and then subsides. Denies dizziness, chest pain, shortness of breath, or syncope with these episodes. States she would like a referral to cardiology due to the new diagnosis. Pt has a walker that is bulky and heavy. States she is unable to lift this into her car or fold it due to the weight. She would like a prescription for a new lightweight walker. Pt states she has generalized fatigue, dry skin, and dry hair. States this has been ongoing for several months.   Relevant past medical, surgical, family, and social history reviewed and updated as indicated.  Allergies and medications reviewed and updated.   Past Medical History:  Diagnosis Date  . Arthritis   . Cancer ([place])    breast  . Cataract   . Diabetes mellitus without complication ([place])   . Frequent UTI   . Hyperlipidemia   . Hypertension   . Neck pain   . Scoliosis   . Stroke [facility])     Past Surgical History:  Procedure Laterality Date  . APPENDECTOMY    . BREAST LUMPECTOMY Left   . CHOLECYSTECTOMY    . EYE SURGERY    . JOINT REPLACEMENT     bilat knee   . REPLACEMENT TOTAL KNEE BILATERAL Bilateral   . TONSILLECTOMY      Social History   Socioeconomic History  . Marital status: Widowed    Spouse name: Not on  file  . Number of children: Not on file  . Years of education: Not on file  . Highest education level: Not on file  Occupational History  . Not on file  Social Needs  . Financial resource strain: Not on file  . Food insecurity:    Worry: Not on file    Inability: Not on file  . Transportation needs:    Medical: Not on file    Non-medical: Not on file  Tobacco Use  . Smoking status: Never Smoker  . Smokeless tobacco: Never Used  Substance and Sexual Activity  . Alcohol use: No    Alcohol/week: 0.0 standard drinks  . Drug use: No  . Sexual activity: Never  Lifestyle  . Physical activity:    Days per week: Not on file    Minutes per session: Not on file  . Stress: Not on file  Relationships  . Social connections:    Talks on phone: Not on file    Gets together: Not on file    Attends religious service: Not on file    Active member of club or organization: Not on file    Attends meetings of clubs or organizations: Not on file    Relationship status: Not on file  . Intimate partner violence:    Fear of current or ex partner: Not on file  Emotionally abused: Not on file    Physically abused: Not on file    Forced sexual activity: Not on file  Other Topics Concern  . Not on file  Social History Narrative   Lives alone in a one story home.  Husband passed away in 07-07-2022.  Has 2 sons.  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  Education: specialty courses with [employer], high school education.    Outpatient Encounter Medications as of 04/22/2018  Medication Sig  . apixaban (ELIQUIS) 5 MG TABS tablet Take 1 tablet (5 mg total) by mouth 2 (two) times daily.  . Cholecalciferol (VITAMIN D3) 5000 units CAPS Take 2,000 Units by mouth daily.   . D-Mannose 500 MG CAPS Take 500 mg by mouth daily.  Marland Kitchen glucose blood (ONE TOUCH ULTRA TEST) test strip USE TO check blood sugars twice daily  . Insulin Glargine (LANTUS SOLOSTAR) 100 UNIT/ML Solostar Pen INJECT 50 UNITS ONCE DAILY AT 10PM  (Patient taking differently: 24 Units. INJECT 50 UNITS ONCE DAILY AT 10PM)  . Insulin Pen Needle 31G X 8 MM MISC Use once daily with lantus solostar  . Magnesium 250 MG TABS Take 250 mg by mouth daily.  . metFORMIN (GLUCOPHAGE) 500 MG tablet Take 1 Tablet by mouth once daily with BREAKFAST  . nabumetone (RELAFEN) 500 MG tablet Take 1 tablet (500 mg total) by mouth 2 (two) times daily. For muscle and joint pain  . nitrofurantoin, macrocrystal-monohydrate, (MACROBID) 100 MG capsule Take 1 capsule (100 mg total) by mouth at bedtime.  Marland Kitchen omeprazole (PRILOSEC) 20 MG capsule Take 1 Capsule by mouth 2 times a day BEFORE meals  . rOPINIRole (REQUIP) 0.25 MG tablet take 1  Tablet by mouth 3 times daily for leg cramps (Patient taking differently: Take 0.25 mg by mouth 2 (two) times daily. take 1  Tablet by mouth 3 times daily for leg cramps)  . SUPER B COMPLEX/C PO Take 1 tablet by mouth daily.  Marland Kitchen triamcinolone (KENALOG) 0.025 % ointment Apply 1 application topically 2 (two) times daily. (Patient taking differently: Apply 1 application topically as needed. )  . gabapentin (NEURONTIN) 600 MG tablet Take 1 tablet (600 mg total) by mouth at bedtime. For pain, and for sleep (Patient not taking: Reported on 04/22/2018)  . [DISCONTINUED] Calcium Carbonate-Vit D-Min (CALCIUM 1200 PO) Take 1 tablet by mouth every other day.   . [DISCONTINUED] diltiazem (DILTIAZEM CD) 240 MG 24 hr capsule Take 1 capsule (240 mg total) by mouth 2 (two) times daily. (Patient not taking: Reported on 04/22/2018)   No facility-administered encounter medications on file as of 04/22/2018.     Allergies  Allergen Reactions  . Diltiazem Hcl     Red itchy rash started a few hours after taking short acting 120mg  dilt tabs  . Ace Inhibitors Cough    Review of Systems  Constitutional: Positive for fatigue. Negative for appetite change, chills, fever and unexpected weight change.  Eyes: Negative for photophobia and visual disturbance.    Respiratory: Negative for cough, chest tightness and shortness of breath.   Cardiovascular: Positive for palpitations. Negative for chest pain and leg swelling.  Gastrointestinal: Negative for abdominal pain.  Endocrine: Negative for polydipsia, polyphagia and polyuria.  Genitourinary: Negative for decreased urine volume and difficulty urinating.  Musculoskeletal: Positive for arthralgias, back pain and gait problem.  Neurological: Negative for dizziness, tremors, syncope, weakness, light-headedness, numbness and headaches.  Psychiatric/Behavioral: Negative for confusion.  All other systems reviewed and are negative.  Objective:    BP (!) 164/80   Pulse 65   Temp (!) 97.2 F (36.2 C) (Oral)   Ht 4\' 10"  (1.473 m)   Wt 156 lb (70.8 kg)   BMI 32.60 kg/m    Wt Readings from Last 3 Encounters:  04/22/18 156 lb (70.8 kg)  04/16/18 156 lb (70.8 kg)  02/17/18 157 lb 3.2 oz (71.3 kg)    Physical Exam Vitals signs and nursing note reviewed.  Constitutional:      General: She is not in acute distress.    Appearance: Normal appearance. She is well-developed and well-groomed. She is obese. She is not ill-appearing.  HENT:     Head: Normocephalic and atraumatic.     Mouth/Throat:     Lips: Pink.     Mouth: Mucous membranes are moist.     Pharynx: Oropharynx is clear.  Eyes:     Extraocular Movements: Extraocular movements intact.     Conjunctiva/sclera: Conjunctivae normal.     Pupils: Pupils are equal, round, and reactive to light.  Neck:     Musculoskeletal: Full passive range of motion without pain and neck supple.     Thyroid: No thyroid mass, thyromegaly or thyroid tenderness.     Vascular: No carotid bruit.  Cardiovascular:     Rate and Rhythm: Normal rate. Rhythm irregularly irregular.     Pulses: Normal pulses.     Heart sounds: No murmur. No friction rub.  Pulmonary:     Effort: Pulmonary effort is normal.     Breath sounds: Normal breath sounds. No rales.   Skin:    General: Skin is warm and dry.     Capillary Refill: Capillary refill takes less than 2 seconds.  Neurological:     General: No focal deficit present.     Mental Status: She is alert and oriented to person, place, and time.  Psychiatric:        Mood and Affect: Mood normal.        Behavior: Behavior normal. Behavior is cooperative.        Thought Content: Thought content normal.        Judgment: Judgment normal.     Results for orders placed or performed in visit on 03/08/18  Urine Culture  Result Value Ref Range   Urine Culture, Routine Final report    Organism ID, Bacteria Comment   Microscopic Examination  Result Value Ref Range   WBC, UA None seen 0 - 5 /hpf   RBC, UA None seen 0 - 2 /hpf   Epithelial Cells (non renal) None seen 0 - 10 /hpf   Renal Epithel, UA None seen None seen /hpf   Bacteria, UA None seen None seen/Few  Urinalysis, Routine w reflex microscopic  Result Value Ref Range   Specific Gravity, UA 1.015 1.005 - 1.030   pH, UA 8.0 (H) 5.0 - 7.5   Color, UA Yellow Yellow   Appearance Ur Clear Clear   Leukocytes, UA Negative Negative   Protein, UA Negative Negative/Trace   Glucose, UA Negative Negative   Ketones, UA Negative Negative   RBC, UA Negative Negative   Bilirubin, UA Negative Negative   Urobilinogen, Ur 0.2 0.2 - 1.0 mg/dL   Nitrite, UA Negative Negative   Microscopic Examination See below:        Pertinent labs & imaging results that were available during my care of the patient were reviewed by me and considered in my medical decision making.  Assessment &  Plan:  Evana was seen today for cardiology referral and discuss medications.  Diagnoses and all orders for this visit:  Encounter to establish care  New onset atrial fibrillation ([place]) Pt has stopped Cardizem per Dr. Livia Snellen' order. Rate controlled at 25 in office today. Has not seen cardiology since diagnosis of A-Fib, will make referral. Pt to report any new or worsening  symptoms.  -     Ambulatory referral to Cardiology  Other fatigue [facility]line TSH on 12/06/2017. Will repeat TSH with thyroid panel today.  -     Thyroid Panel With TSH  Other intervertebral disc degeneration, lumbar region -     For home use only DME 4 wheeled rolling walker with seat ([id number])   Continue all other maintenance medications.  Follow up plan: Return in about 3 months (around 07/22/2018), or if symptoms worsen or fail to improve.  Educational handout given for atrial fibrillation   The above assessment and management plan was discussed with the patient. The patient verbalized understanding of and has agreed to the management plan. Patient is aware to call the clinic if symptoms persist or worsen. Patient is aware when to return to the clinic for a follow-up visit. Patient educated on when it is appropriate to go to the emergency department.   Monia Pouch, FNP-C [facility] [phone]

## 2018-04-23 LAB — THYROID PANEL WITH TSH
Free Thyroxine Index: 2 (ref 1.2–4.9)
T3 Uptake Ratio: 27 % (ref 24–39)
T4, Total: 7.4 ug/dL (ref 4.5–12.0)
TSH: 1.64 u[IU]/mL (ref 0.450–4.500)

## 2018-05-03 ENCOUNTER — Other Ambulatory Visit: Payer: Self-pay

## 2018-05-03 ENCOUNTER — Telehealth: Payer: Self-pay | Admitting: Family Medicine

## 2018-05-03 DIAGNOSIS — R0602 Shortness of breath: Secondary | ICD-10-CM

## 2018-05-11 NOTE — Progress Notes (Signed)
Cardiology Office Note   Date:  05/12/2018   ID:  [name], DOB [birth year], MRN [id number]  PCP:  Baruch Gouty, FNP  Cardiologist:    [place], MD   Chief Complaint  Patient presents with  . Follow-up  . Shortness of Breath  . Headache  . Chest Pain      History of Present Illness: [name] is a 85 y.o. female who is seen at the request of Darla Lesches FNP for evaluation of SOB. She has a history of DM, HLD, HTN and prior CVA ( right pontine/thalamic in 2007). She has a history of Afib diagnosed in August 2019 and is on chronic Eliquis. Prior Echo in August showed normal EF with mild MR, moderate TR. Estimated RV systolic pressure 58 mmHg. Marked biatrial enlargement. She has moderate carotid arterial disease 40-59% on the right and < 39% on the left.   She was admitted with acute respiratory failure in August. This was felt to be multifactorial with pulmonary fibrosis and acute diastolic CHF. CT chest showed no PE. Mild coronary calcification, some emphysema changes and atelectasis. She was managed with steroids, inhalers and diuretics. She did not require oxygen. She was DC on diltiazem but this was later stopped due to rash. She states she only has slight dyspnea on exertion. She walks with a walker. No chest pain. She does have some ankle edema. Doesn't use support hose because she can't get them on. Has lasix prn but hasn't used it. Was followed by pulmonary in the distant past for pulmonary fibrosis. She does have significant HTN but is not on any therapy.    Past Medical History:  Diagnosis Date  . Arthritis   . Cancer ([place])    breast  . Cataract   . Diabetes mellitus without complication ([place])   . Frequent UTI   . Hyperlipidemia   . Hypertension   . Neck pain   . Scoliosis   . Stroke [facility])     Past Surgical History:  Procedure Laterality Date  . APPENDECTOMY    . BREAST LUMPECTOMY Left   . CHOLECYSTECTOMY    . EYE SURGERY    . JOINT  REPLACEMENT     bilat knee   . REPLACEMENT TOTAL KNEE BILATERAL Bilateral   . TONSILLECTOMY       Current Outpatient Medications  Medication Sig Dispense Refill  . apixaban (ELIQUIS) 5 MG TABS tablet Take 1 tablet (5 mg total) by mouth 2 (two) times daily. 60 tablet 1  . chlorthalidone (HYGROTON) 25 MG tablet Take 1 tablet (25 mg total) by mouth daily. 90 tablet 3  . Cholecalciferol (VITAMIN D3) 5000 units CAPS Take 2,000 Units by mouth daily.     . D-Mannose 500 MG CAPS Take 500 mg by mouth daily.    Marland Kitchen gabapentin (NEURONTIN) 600 MG tablet Take 1 tablet (600 mg total) by mouth at bedtime. For pain, and for sleep (Patient not taking: Reported on 04/22/2018) 30 tablet 2  . glucose blood (ONE TOUCH ULTRA TEST) test strip USE TO check blood sugars twice daily 100 each 12  . Insulin Glargine (LANTUS SOLOSTAR) 100 UNIT/ML Solostar Pen INJECT 50 UNITS ONCE DAILY AT 10PM (Patient taking differently: 24 Units. INJECT 50 UNITS ONCE DAILY AT 10PM) 15 mL 1  . Insulin Pen Needle 31G X 8 MM MISC Use once daily with lantus solostar 100 each 6  . Magnesium 250 MG TABS Take 250 mg by mouth daily.    Marland Kitchen  metFORMIN (GLUCOPHAGE) 500 MG tablet Take 1 Tablet by mouth once daily with BREAKFAST 90 tablet 1  . nabumetone (RELAFEN) 500 MG tablet Take 1 tablet (500 mg total) by mouth 2 (two) times daily. For muscle and joint pain 60 tablet 2  . nitrofurantoin, macrocrystal-monohydrate, (MACROBID) 100 MG capsule Take 1 capsule (100 mg total) by mouth at bedtime. 30 capsule 5  . omeprazole (PRILOSEC) 20 MG capsule Take 1 Capsule by mouth 2 times a day BEFORE meals 180 capsule 1  . rOPINIRole (REQUIP) 0.25 MG tablet take 1  Tablet by mouth 3 times daily for leg cramps (Patient taking differently: Take 0.25 mg by mouth 2 (two) times daily. take 1  Tablet by mouth 3 times daily for leg cramps) 90 tablet 1  . SUPER B COMPLEX/C PO Take 1 tablet by mouth daily.    Marland Kitchen triamcinolone (KENALOG) 0.025 % ointment Apply 1 application  topically 2 (two) times daily. (Patient taking differently: Apply 1 application topically as needed. ) 60 g 2   No current facility-administered medications for this visit.     Allergies:   Diltiazem hcl and Ace inhibitors    Social History:  The patient  reports that she has never smoked. She has never used smokeless tobacco. She reports that she does not drink alcohol or use drugs.   Family History:  The patient's family history includes Alcohol abuse in her brother; Arthritis in her mother; Cancer in her brother and mother; Heart disease in her father.    ROS:  Please see the history of present illness.   Otherwise, review of systems are positive for none.   All other systems are reviewed and negative.    PHYSICAL EXAM: VS:  BP (!) 168/80 (BP Location: Left Arm, Patient Position: Sitting, Cuff Size: Normal)   Pulse 71   Ht 4\' 10"  (1.473 m)   Wt 153 lb (69.4 kg)   BMI 31.98 kg/m  , BMI Body mass index is 31.98 kg/m. GEN: Elderly WF, Well nourished, well developed, in no acute distress  HEENT: normal  Neck: no JVD, carotid bruits, or masses Cardiac: IRRR; no murmurs, rubs, or gallops, 1+ ankle edema  Respiratory:  Diffuse crackles. GI: soft, nontender, nondistended, + BS MS: no deformity or atrophy  Skin: warm and dry, no rash Neuro:  Strength and sensation are intact Psych: euthymic mood, full affect   EKG:  EKG is ordered today. The ekg ordered today demonstrates Afib with rate 71. Otherwise normal. I have personally reviewed and interpreted this study.    Recent Labs: 12/09/2017: Hemoglobin 11.1; Magnesium 2.3; Platelets 258 01/29/2018: ALT 21; BNP 192.2; BUN 28; Creatinine, Ser 1.06; Potassium 5.6; Sodium 140 04/22/2018: TSH 1.640    Lipid Panel    Component Value Date/Time   CHOL 239 (H) 07/31/2017 1641   CHOL 176 11/09/2012 1604   TRIG 88 07/31/2017 1641   TRIG 189 (H) 07/14/2013 1525   TRIG 242 (H) 11/09/2012 1604   HDL 53 07/31/2017 1641   HDL 52  07/14/2013 1525   HDL 44 11/09/2012 1604   CHOLHDL 4.5 (H) 07/31/2017 1641   LDLCALC 168 (H) 07/31/2017 1641   LDLCALC 102 (H) 07/14/2013 1525   LDLCALC 84 11/09/2012 1604      Wt Readings from Last 3 Encounters:  05/12/18 153 lb (69.4 kg)  04/22/18 156 lb (70.8 kg)  04/16/18 156 lb (70.8 kg)      Other studies Reviewed: Additional studies/ records that were reviewed today include:  Echo 12/07/17: Study Conclusions  - Left ventricle: The cavity size was normal. Wall thickness was   increased increased in a pattern of mild to moderate LVH.   Systolic function was normal. The estimated ejection fraction was   in the range of 60% to 65%. Wall motion was normal; there were no   regional wall motion abnormalities. - Aortic valve: Mildly calcified annulus. Trileaflet; mildly   thickened leaflets. Valve area (VTI): 2.15 cm^2. Valve area   (Vmax): 2.15 cm^2. - Mitral valve: Mildly calcified annulus. Normal thickness leaflets   . There was mild regurgitation. - Left atrium: The atrium was severely dilated. - Right ventricle: Systolic function was mildly reduced. - Right atrium: The atrium was severely dilated. - Atrial septum: No defect or patent foramen ovale was identified. - Tricuspid valve: There was moderate regurgitation. - Pulmonary arteries: Systolic pressure was moderately increased.   PA peak pressure: 58 mm Hg (S).   ASSESSMENT AND PLAN:  1.  Atrial fibrillation probably permanent. Likely of longstanding duration. Appropriately on anticoagulation with Eliquis. HR is normal on no medication. No other treatment needed currently. 2. Pulmonary HTN secondary to chronic pulmonary fibrosis +/- diastolic CHF.  3. Chronic diastolic CHF secondary to age and uncontrolled HTN. Mild ankle edema now. Recommend sodium restriction. Elevate feet when possible. She does not need lasix currently. Will focus on BP control 4. HTN poorly controlled. Rash on diltiazem. Cough on ACEi. Will  start chlorthalidone 25 mg daily. Check BMET in 3 weeks. 5. DM on metformin 6. HLD I will not start statin therapy at her advanced age.    Current medicines are reviewed at length with the patient today.  The patient does not have concerns regarding medicines.  The following changes have been made:  Add chlorthalidone 25 mg daily  Labs/ tests ordered today include: BMET in 3 weeks.   Orders Placed This Encounter  Procedures  . EKG 12-Lead     Disposition:   FU with me  in 3 months  Signed,  [place], MD  05/12/2018 2:27 PM    [facility] [address], [place], [place], [address] Phone [phone], Fax [phone]

## 2018-05-12 ENCOUNTER — Encounter: Payer: Self-pay | Admitting: Cardiology

## 2018-05-12 ENCOUNTER — Ambulatory Visit (INDEPENDENT_AMBULATORY_CARE_PROVIDER_SITE_OTHER): Payer: PPO | Admitting: Cardiology

## 2018-05-12 VITALS — BP 168/80 | HR 71 | Ht <= 58 in | Wt 153.0 lb

## 2018-05-12 DIAGNOSIS — I4821 Permanent atrial fibrillation: Secondary | ICD-10-CM | POA: Diagnosis not present

## 2018-05-12 DIAGNOSIS — I5032 Chronic diastolic (congestive) heart failure: Secondary | ICD-10-CM

## 2018-05-12 DIAGNOSIS — I10 Essential (primary) hypertension: Secondary | ICD-10-CM | POA: Diagnosis not present

## 2018-05-12 DIAGNOSIS — I272 Pulmonary hypertension, unspecified: Secondary | ICD-10-CM

## 2018-05-12 DIAGNOSIS — J8410 Pulmonary fibrosis, unspecified: Secondary | ICD-10-CM

## 2018-05-12 MED ORDER — CHLORTHALIDONE 25 MG PO TABS: 25.0000 mg | ORAL_TABLET | Freq: Every day | ORAL | 3 refills | Status: DC

## 2018-05-12 NOTE — Patient Instructions (Signed)
Restrict salt intake  Take chlorthalidone 25 mg daily  We should check your kidney function in 3 weeks  Continue your other therapy  I will see you in 3 months

## 2018-05-13 ENCOUNTER — Other Ambulatory Visit: Payer: Self-pay

## 2018-05-13 ENCOUNTER — Encounter (HOSPITAL_COMMUNITY): Payer: Self-pay

## 2018-05-13 ENCOUNTER — Emergency Department (HOSPITAL_COMMUNITY)
Admission: EM | Admit: 2018-05-13 | Discharge: 2018-05-14 | Disposition: A | Discharge status: Home / Self Care | Payer: PPO | Attending: Emergency Medicine | Admitting: Emergency Medicine

## 2018-05-13 DIAGNOSIS — I110 Hypertensive heart disease with heart failure: Secondary | ICD-10-CM | POA: Insufficient documentation

## 2018-05-13 DIAGNOSIS — I5033 Acute on chronic diastolic (congestive) heart failure: Secondary | ICD-10-CM | POA: Diagnosis not present

## 2018-05-13 DIAGNOSIS — Z7901 Long term (current) use of anticoagulants: Secondary | ICD-10-CM | POA: Diagnosis not present

## 2018-05-13 DIAGNOSIS — Z96653 Presence of artificial knee joint, bilateral: Secondary | ICD-10-CM | POA: Insufficient documentation

## 2018-05-13 DIAGNOSIS — R339 Retention of urine, unspecified: Secondary | ICD-10-CM | POA: Diagnosis not present

## 2018-05-13 DIAGNOSIS — Z853 Personal history of malignant neoplasm of breast: Secondary | ICD-10-CM | POA: Diagnosis not present

## 2018-05-13 DIAGNOSIS — Z79899 Other long term (current) drug therapy: Secondary | ICD-10-CM | POA: Diagnosis not present

## 2018-05-13 DIAGNOSIS — E119 Type 2 diabetes mellitus without complications: Secondary | ICD-10-CM | POA: Insufficient documentation

## 2018-05-13 DIAGNOSIS — Z794 Long term (current) use of insulin: Secondary | ICD-10-CM | POA: Diagnosis not present

## 2018-05-13 LAB — CBC WITH DIFFERENTIAL/PLATELET
ABS IMMATURE GRANULOCYTES: 0.02 10*3/uL (ref 0.00–0.07)
BASOS PCT: 0 %
Basophils Absolute: 0 10*3/uL (ref 0.0–0.1)
Eosinophils Absolute: 0.3 10*3/uL (ref 0.0–0.5)
Eosinophils Relative: 4 %
HCT: 41.7 % (ref 36.0–46.0)
HEMOGLOBIN: 12.3 g/dL (ref 12.0–15.0)
Immature Granulocytes: 0 %
LYMPHS PCT: 17 %
Lymphs Abs: 1.3 10*3/uL (ref 0.7–4.0)
MCH: 23.6 pg — ABNORMAL LOW (ref 26.0–34.0)
MCHC: 29.5 g/dL — ABNORMAL LOW (ref 30.0–36.0)
MCV: 80 fL (ref 80.0–100.0)
Monocytes Absolute: 1 10*3/uL (ref 0.1–1.0)
Monocytes Relative: 14 %
NEUTROS ABS: 4.7 10*3/uL (ref 1.7–7.7)
Neutrophils Relative %: 65 %
Platelets: 227 10*3/uL (ref 150–400)
RBC: 5.21 MIL/uL — AB (ref 3.87–5.11)
RDW: 17.6 % — ABNORMAL HIGH (ref 11.5–15.5)
WBC: 7.4 10*3/uL (ref 4.0–10.5)
nRBC: 0 % (ref 0.0–0.2)

## 2018-05-13 LAB — BASIC METABOLIC PANEL
Anion gap: 8 (ref 5–15)
BUN: 27 mg/dL — ABNORMAL HIGH (ref 8–23)
CO2: 29 mmol/L (ref 22–32)
Calcium: 9.1 mg/dL (ref 8.9–10.3)
Chloride: 99 mmol/L (ref 98–111)
Creatinine, Ser: 1.04 mg/dL — ABNORMAL HIGH (ref 0.44–1.00)
GFR calc Af Amer: 55 mL/min — ABNORMAL LOW (ref >60)
GFR, EST NON AFRICAN AMERICAN: 47 mL/min — AB (ref >60)
Glucose, Bld: 145 mg/dL — ABNORMAL HIGH (ref 70–99)
Potassium: 3.7 mmol/L (ref 3.5–5.1)
Sodium: 136 mmol/L (ref 135–145)

## 2018-05-13 NOTE — ED Notes (Signed)
Bladder scan is 39ml

## 2018-05-13 NOTE — ED Provider Notes (Signed)
[facility] EMERGENCY DEPARTMENT Provider Note   CSN: [id number] Arrival date & time: 05/13/18  2204     History   Chief Complaint Chief Complaint  Patient presents with  . Urinary Retention    HPI [name] is a 85 y.o. female who presents with decreased urination today. PMH significant for chronic A.fib on Eliquis, CHF EF 60-65%, pulmonary fibrosis, frequent UTI, HTN, HLD, diabetes, hx of breast cancer, hx of stroke. She states that yesterday she went to the cardiology for evaluation of her shortness of breath. She has chronic SOB which seems to be worse at night when she is lying down. She was admitted in August for acute respiratory failure which was thought to be multifactorial due to pulmonary fibrosis and CHF. Her cardiologist recommended stopping Lasix and starting Chlorthalidone. Today she woke up with a headache behind her left eye. She states she has this frequently but it seemed to be worse today. She denies having a headache at this time. She states her SOB is not worse than normal. Sometimes she will have a "twinge" of chest pain that lasts a couple seconds but no lasting or exertional pain. No fever or cough. She has been drinking fluids all day and is concerned that she hasn't urinated since 8:30 this morning. She denies abdominal pain or that she feels the need to urinate. She has a BM this morning. She feels thirsty. She takes Macrobid chronically for recurrent UTI.  HPI  Past Medical History:  Diagnosis Date  . Arthritis   . Cancer ([place])    breast  . Cataract   . Diabetes mellitus without complication ([place])   . Frequent UTI   . Hyperlipidemia   . Hypertension   . Neck pain   . Scoliosis   . Stroke [facility])     Patient Active Problem List   Diagnosis Date Noted  . Nausea 03/01/2018  . Pulmonary vascular congestion   . Acute on chronic diastolic CHF (congestive heart failure) ([place])   . Acute hypoxemic respiratory failure ([place]) 12/06/2017  . New onset  atrial fibrillation ([place]) 12/06/2017  . Trochanteric bursitis, right hip 07/16/2017  . Other intervertebral disc degeneration, lumbar region 07/16/2017  . Osteoporosis 11/04/2016  . Diabetic polyneuropathy associated with type 2 diabetes mellitus ([place]) 11/14/2014  . Neck pain   . Fibromyalgia 02/11/2013  . Urinary tract infection 08/02/2012  . Cerebral artery occlusion with cerebral infarction ([place]) 08/01/2008  . Osteoarthritis 04/09/2007  . Hyperlipemia 03/08/2007  . Essential hypertension 03/08/2007  . Pulmonary fibrosis ([place]) 03/08/2007  . GERD 03/08/2007  . BREAST CANCER, HX OF 03/08/2007    Past Surgical History:  Procedure Laterality Date  . APPENDECTOMY    . BREAST LUMPECTOMY Left   . CHOLECYSTECTOMY    . EYE SURGERY    . JOINT REPLACEMENT     bilat knee   . REPLACEMENT TOTAL KNEE BILATERAL Bilateral   . TONSILLECTOMY       OB History   No obstetric history on file.      Home Medications    Prior to Admission medications   Medication Sig Start Date End Date Taking? Authorizing Provider  apixaban (ELIQUIS) 5 MG TABS tablet Take 1 tablet (5 mg total) by mouth 2 (two) times daily. 12/10/17   Barton Dubois, MD  chlorthalidone (HYGROTON) 25 MG tablet Take 1 tablet (25 mg total) by mouth daily. 05/12/18 05/07/19  Martinique, Peter M, MD  Cholecalciferol (VITAMIN D3) 5000 units CAPS Take 2,000 Units by  mouth daily.     [provider]  D-Mannose 500 MG CAPS Take 500 mg by mouth daily.    [provider]  diltiazem (DILTIAZEM CD) 240 MG 24 hr capsule Take 240 mg by mouth 2 (two) times daily.    [provider]  furosemide (LASIX) 40 MG tablet Take 40 mg by mouth daily.    [provider]  gabapentin (NEURONTIN) 100 MG capsule Take 300 mg by mouth 2 (two) times daily.    [provider]  glucose blood (ONE TOUCH ULTRA TEST) test strip USE TO check blood sugars twice daily 10/30/17   Claretta Fraise, MD  Insulin Glargine (LANTUS SOLOSTAR)  100 UNIT/ML Solostar Pen INJECT 50 UNITS ONCE DAILY AT 10PM Patient taking differently: 24 Units. INJECT 50 UNITS ONCE DAILY AT 10PM 01/29/18   Claretta Fraise, MD  Insulin Pen Needle 31G X 8 MM MISC Use once daily with lantus solostar 10/30/17   Claretta Fraise, MD  Magnesium 250 MG TABS Take 250 mg by mouth daily.    [provider]  metFORMIN (GLUCOPHAGE) 500 MG tablet Take 1 Tablet by mouth once daily with BREAKFAST 10/30/17   Claretta Fraise, MD  nabumetone (RELAFEN) 500 MG tablet Take 1 tablet (500 mg total) by mouth 2 (two) times daily. For muscle and joint pain 04/16/18   Claretta Fraise, MD  nitrofurantoin, macrocrystal-monohydrate, (MACROBID) 100 MG capsule Take 1 capsule (100 mg total) by mouth at bedtime. 10/15/17   Claretta Fraise, MD  omeprazole (PRILOSEC) 20 MG capsule Take 1 Capsule by mouth 2 times a day BEFORE meals 10/30/17   Claretta Fraise, MD  rOPINIRole (REQUIP) 0.25 MG tablet take 1  Tablet by mouth 3 times daily for leg cramps Patient taking differently: Take 0.25 mg by mouth 2 (two) times daily. take 1  Tablet by mouth 3 times daily for leg cramps 10/30/17   Claretta Fraise, MD  SUPER B COMPLEX/C PO Take 1 tablet by mouth daily.    [provider]  triamcinolone (KENALOG) 0.025 % ointment Apply 1 application topically 2 (two) times daily. Patient taking differently: Apply 1 application topically as needed.  11/03/17   Claretta Fraise, MD    Family History Family History  Problem Relation Age of Onset  . Cancer Mother        lung  . Arthritis Mother   . Heart disease Father        endocarditis  . Cancer Brother        lung, throat  . Alcohol abuse Brother     Social History Social History   Tobacco Use  . Smoking status: Never Smoker  . Smokeless tobacco: Never Used  Substance Use Topics  . Alcohol use: No    Alcohol/week: 0.0 standard drinks  . Drug use: No     Allergies   Diltiazem hcl and Ace inhibitors   Review of Systems Review of Systems    Constitutional: Negative for fever.  Eyes: Negative for visual disturbance.  Respiratory: Positive for shortness of breath (chronic). Negative for cough.   Cardiovascular: Positive for leg swelling (chronic). Negative for chest pain.  Gastrointestinal: Negative for abdominal pain.  Genitourinary: Positive for decreased urine volume. Negative for difficulty urinating, dysuria and frequency.  Neurological: Positive for headaches (resolved). Negative for dizziness, syncope, weakness and numbness.  All other systems reviewed and are negative.    Physical Exam Updated Vital Signs BP (!) 163/77 (BP Location: Right Arm)   Pulse 60   Temp  98.6 F (37 C) (Tympanic)   Resp 18   SpO2 94%   Physical Exam Vitals signs and nursing note reviewed.  Constitutional:      General: She is not in acute distress.    Appearance: Normal appearance. She is well-developed.     Comments: Elderly female in NAD, pleasant  HENT:     Head: Normocephalic and atraumatic.  Eyes:     General: No scleral icterus.       Right eye: No discharge.        Left eye: No discharge.     Extraocular Movements: Extraocular movements intact.     Conjunctiva/sclera: Conjunctivae normal.     Pupils: Pupils are equal, round, and reactive to light.  Neck:     Musculoskeletal: Normal range of motion.  Cardiovascular:     Rate and Rhythm: Normal rate. Rhythm irregularly irregular.  Pulmonary:     Effort: Pulmonary effort is normal. No respiratory distress.     Comments: Crackles Abdominal:     General: Abdomen is protuberant. There is no distension.     Palpations: Abdomen is soft.     Tenderness: There is no abdominal tenderness.  Musculoskeletal:     Right lower leg: Edema present.     Left lower leg: Edema present.     Comments: Mild ankle edema - pt states this is improved from baseline  Skin:    General: Skin is warm and dry.  Neurological:     Mental Status: She is alert and oriented to person, place, and  time.  Psychiatric:        Behavior: Behavior normal.      ED Treatments / Results  Labs (all labs ordered are listed, but only abnormal results are displayed) Labs Reviewed  CBC WITH DIFFERENTIAL/PLATELET - Abnormal; Notable for the following components:      Result Value   RBC 5.21 (*)    MCH 23.6 (*)    MCHC 29.5 (*)    RDW 17.6 (*)    All other components within normal limits  BASIC METABOLIC PANEL  URINALYSIS, ROUTINE W REFLEX MICROSCOPIC    EKG None  Radiology No results found.  Procedures Procedures (including critical care time)  Medications Ordered in ED Medications - No data to display   Initial Impression / Assessment and Plan / ED Course  I have reviewed the triage vital signs and the nursing notes.  Pertinent labs & imaging results that were available during my care of the patient were reviewed by me and considered in my medical decision making (see chart for details).  85 year old female presents with acute urinary retention. She is hypertensive but otherwise vitals are normal. On exam heart if regular rate with irregularly irregular rhythm. Lungs are CTA. Abdomen is soft and non-tender. Bladder scan reveals 331 cc of urine. She interestingly does not have the urge to urinate so unclear if this number is accurate or not.  Shared visit with Dr. Roxanne Mins. Will obtain CBC, BMP, UA, and insert foley catheter.  CBC is essentially normal. BMP shows slightly high BUN/SCr (27/1.04) but this is about her baseline. In and out catheter was ordered. As staff was about to obtain UA, the patient felt the urge to urinate and was placed on the bedpan. Per her primary RN she urinated a significant amount however unfortunately this was not quantified. Repeat bladder scan revealed ~270cc residual urine. The patient still denies any abdominal pain or need to urinate. Will send of  UA and reassess. The patient would prefer not having catheterization done and since she can urinate on  her own, I feel this is reasonable.   UA does not show obvious infection and appears contaminated. Discussed results with patient. She feels comfortable with d/c. Advised follow up with PCP and have kidney function rechecked. Advised return if worsening.    Final Clinical Impressions(s) / ED Diagnoses   Final diagnoses:  Urinary retention    ED Discharge Orders    None       Recardo Evangelist, PA-C 00/86/76 [id number]    Delora Fuel, MD 93/26/71 [phone]

## 2018-05-13 NOTE — ED Triage Notes (Addendum)
Pt reports urinary retention that started this morning. Pt saw her Dr yesterday and he told her to stop Lasix and start Chlorthalidone 25 mg, she took the first pill this morning and has consumed at least 2 twenty oz glasses of water with supper and still has not urinated, denies need or feeling to urinate. Pt also reports having headache and eye pain behind left eye that started this am.

## 2018-05-14 ENCOUNTER — Telehealth: Payer: Self-pay | Admitting: Cardiology

## 2018-05-14 ENCOUNTER — Other Ambulatory Visit: Payer: Self-pay

## 2018-05-14 ENCOUNTER — Other Ambulatory Visit: Payer: Self-pay | Admitting: Family Medicine

## 2018-05-14 LAB — URINALYSIS, ROUTINE W REFLEX MICROSCOPIC
Bilirubin Urine: NEGATIVE
Glucose, UA: NEGATIVE mg/dL
Hgb urine dipstick: NEGATIVE
Ketones, ur: NEGATIVE mg/dL
Nitrite: NEGATIVE
Protein, ur: NEGATIVE mg/dL
SPECIFIC GRAVITY, URINE: 1.012 (ref 1.005–1.030)
pH: 5 (ref 5.0–8.0)

## 2018-05-14 MED ORDER — HYDRALAZINE HCL 25 MG PO TABS: ORAL_TABLET | ORAL | 6 refills | Status: DC

## 2018-05-14 NOTE — Addendum Note (Signed)
Addended by: Kathyrn Lass on: 05/14/2018 04:33 PM   Modules accepted: Orders

## 2018-05-14 NOTE — Telephone Encounter (Signed)
Returned call to patient she stated she took first dose of Chlorthalidone 25 mg yesterday.Stated she was not unable to urinate 12 hours after taking.Stated she went to [facility] ED last night, no infection.She was able to urinate in ED.Stated she is afraid to take Chlorthalidone.She has urinated this morning. Advised I will send message to [place] for advice.

## 2018-05-14 NOTE — Telephone Encounter (Signed)
She is already intolerant of ACEi and diltiazem. Unable to use beta blockers due to HR. Her unwillingness to give this a try really limits what we can use to treat her BP. We can try hydralazine 25 mg bid.   Kassidy Dockendorf Martin[place] MD, [facility]

## 2018-05-14 NOTE — Telephone Encounter (Signed)
Returned call to patient Dr.Jordan's advice given.She stated she did not want to take Chlorthalidone.She wants to take another medication.Message sent to [place] for advice.

## 2018-05-14 NOTE — ED Notes (Addendum)
Bladder scan shows 274ml  After pt voided

## 2018-05-14 NOTE — Telephone Encounter (Signed)
Received call back from patient Dr.Jordan's recommendation given.Hydralazine 25 mg prescription sent to pharmacy.

## 2018-05-14 NOTE — Telephone Encounter (Signed)
Pt c/o medication issue:  1. Name of Medication: Chlorthalidone 25mg    2. How are you currently taking this medication (dosage and times per day)?   3. Are you having a reaction (difficulty breathing--STAT)?   4. What is your medication issue? Patient took first pill yesterday at 9am, by last night 9pm she has not voided. So she went to the ED last night. She wants to know she is take the medication again.

## 2018-05-14 NOTE — Telephone Encounter (Signed)
Returned call to patient no answer.LMTC. 

## 2018-05-14 NOTE — Discharge Instructions (Addendum)
Please follow up with your doctor and have your kidney function rechecked When you are home, sit on the toilet and try to urinate every 4 hours (during the day) If you are still unable to urinate for a day or start having abdominal pain, please come back to the Emergency Dept.

## 2018-05-14 NOTE — Telephone Encounter (Signed)
The chlorthalidone should not affect her ability to urinate. If anything it should make it more.  Peter Martinique MD, [facility]

## 2018-05-16 LAB — URINE CULTURE: Culture: 40000 — AB

## 2018-05-17 ENCOUNTER — Telehealth: Payer: Self-pay | Admitting: Family Medicine

## 2018-05-17 ENCOUNTER — Telehealth: Payer: Self-pay | Admitting: Emergency Medicine

## 2018-05-17 NOTE — Telephone Encounter (Signed)
Post ED Visit - Positive Culture Follow-up  Culture report reviewed by antimicrobial stewardship pharmacist:  []  Elenor Quinones, Pharm.D. []  Heide Guile, Pharm.D., BCPS AQ-ID []  Parks Neptune, Pharm.D., BCPS []  Alycia Rossetti, Pharm.D., BCPS []  [place], Pharm.D., BCPS, AAHIVP []  Legrand Como, Pharm.D., BCPS, AAHIVP []  Salome Arnt, PharmD, BCPS []  Johnnette Gourd, PharmD, BCPS []  Hughes Better, PharmD, BCPS []  Leeroy Cha, PharmD [place] Guardian PharmD  Positive urine culture Treated with chronic nitrofurantoin, organism sensitive to the same and no further patient follow-up is required at this time.  Hazle Nordmann 05/17/2018, 8:53 AM

## 2018-05-18 MED ORDER — NITROFURANTOIN MONOHYD MACRO 100 MG PO CAPS: 100.0000 mg | ORAL_CAPSULE | Freq: Every day | ORAL | 5 refills | Status: DC

## 2018-05-18 NOTE — Telephone Encounter (Signed)
Macrobid refill requested Pt takes medication for UTI prophylaxis Please review and advise

## 2018-05-18 NOTE — Telephone Encounter (Signed)
That is fine 

## 2018-05-18 NOTE — Telephone Encounter (Signed)
Pt notified RX approved and sent to pharmacy

## 2018-05-20 ENCOUNTER — Ambulatory Visit (INDEPENDENT_AMBULATORY_CARE_PROVIDER_SITE_OTHER): Payer: PPO | Admitting: Family Medicine

## 2018-05-20 ENCOUNTER — Encounter: Payer: Self-pay | Admitting: Family Medicine

## 2018-05-20 ENCOUNTER — Other Ambulatory Visit: Payer: Self-pay

## 2018-05-20 ENCOUNTER — Ambulatory Visit: Payer: PPO | Admitting: Cardiology

## 2018-05-20 VITALS — BP 171/86 | HR 66 | Temp 98.1°F | Ht <= 58 in | Wt 152.0 lb

## 2018-05-20 DIAGNOSIS — J8410 Pulmonary fibrosis, unspecified: Secondary | ICD-10-CM | POA: Diagnosis not present

## 2018-05-20 DIAGNOSIS — I5032 Chronic diastolic (congestive) heart failure: Secondary | ICD-10-CM | POA: Diagnosis not present

## 2018-05-20 DIAGNOSIS — N289 Disorder of kidney and ureter, unspecified: Secondary | ICD-10-CM | POA: Diagnosis not present

## 2018-05-20 DIAGNOSIS — I10 Essential (primary) hypertension: Secondary | ICD-10-CM | POA: Diagnosis not present

## 2018-05-20 DIAGNOSIS — I272 Pulmonary hypertension, unspecified: Secondary | ICD-10-CM | POA: Diagnosis not present

## 2018-05-20 DIAGNOSIS — I4821 Permanent atrial fibrillation: Secondary | ICD-10-CM | POA: Diagnosis not present

## 2018-05-20 NOTE — Progress Notes (Signed)
Subjective:    Patient ID: [name], female    DOB: Jun 30, 1925, 85 y.o.   MRN: [id number]  Chief Complaint:  ER Followup   HPI: [name] is a 85 y.o. female presenting on 05/20/2018 for ER Followup   1. Kidney function abnormal   Pt presents today for ED follow up. Pt was seen on 05/13/2018 for urinary retention. Her BUN was 27 and creatinine was 1.04. ED provider wanted kidney function rechecked. Pt denies anuria, decreased urination, or increased swelling. Pt states she is not taking lasix or chlorthalidone. She denies chest pain, shortness of breath, orthopnea, or palpitations. Pt states she does have slight left lower extremity swelling.   Relevant past medical, surgical, family, and social history reviewed and updated as indicated.  Allergies and medications reviewed and updated.   Past Medical History:  Diagnosis Date  . Arthritis   . Cancer ([place])    breast  . Cataract   . Diabetes mellitus without complication ([place])   . Frequent UTI   . Hyperlipidemia   . Hypertension   . Neck pain   . Scoliosis   . Stroke [facility])     Past Surgical History:  Procedure Laterality Date  . APPENDECTOMY    . BREAST LUMPECTOMY Left   . CHOLECYSTECTOMY    . EYE SURGERY    . JOINT REPLACEMENT     bilat knee   . REPLACEMENT TOTAL KNEE BILATERAL Bilateral   . TONSILLECTOMY      Social History   Socioeconomic History  . Marital status: Widowed    Spouse name: Not on file  . Number of children: Not on file  . Years of education: Not on file  . Highest education level: Not on file  Occupational History  . Not on file  Social Needs  . Financial resource strain: Not on file  . Food insecurity:    Worry: Not on file    Inability: Not on file  . Transportation needs:    Medical: Not on file    Non-medical: Not on file  Tobacco Use  . Smoking status: Never Smoker  . Smokeless tobacco: Never Used  Substance and Sexual Activity  . Alcohol use: No   Alcohol/week: 0.0 standard drinks  . Drug use: No  . Sexual activity: Never  Lifestyle  . Physical activity:    Days per week: Not on file    Minutes per session: Not on file  . Stress: Not on file  Relationships  . Social connections:    Talks on phone: Not on file    Gets together: Not on file    Attends religious service: Not on file    Active member of club or organization: Not on file    Attends meetings of clubs or organizations: Not on file    Relationship status: Not on file  . Intimate partner violence:    Fear of current or ex partner: Not on file    Emotionally abused: Not on file    Physically abused: Not on file    Forced sexual activity: Not on file  Other Topics Concern  . Not on file  Social History Narrative   Lives alone in a one story home.  Husband passed away in 2022/07/11.  Has 2 sons.  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  Education: specialty courses with [employer], high school education.    Outpatient Encounter Medications as of 05/20/2018  Medication Sig  . apixaban (  ELIQUIS) 5 MG TABS tablet Take 1 tablet (5 mg total) by mouth 2 (two) times daily.  . Cholecalciferol (VITAMIN D3) 5000 units CAPS Take 2,000 Units by mouth daily.   . D-Mannose 500 MG CAPS Take 500 mg by mouth daily.  Marland Kitchen diltiazem (DILTIAZEM CD) 240 MG 24 hr capsule Take 240 mg by mouth 2 (two) times daily.  . furosemide (LASIX) 40 MG tablet Take 40 mg by mouth daily.  Marland Kitchen gabapentin (NEURONTIN) 100 MG capsule Take 300 mg by mouth 2 (two) times daily.  Marland Kitchen glucose blood (ONE TOUCH ULTRA TEST) test strip USE TO check blood sugars twice daily  . hydrALAZINE (APRESOLINE) 25 MG tablet Take 25 mg twice a day  . Insulin Glargine (LANTUS SOLOSTAR) 100 UNIT/ML Solostar Pen INJECT 50 UNITS ONCE DAILY AT 10PM (Patient taking differently: 24 Units. INJECT 50 UNITS ONCE DAILY AT 10PM)  . Insulin Pen Needle 31G X 8 MM MISC Use once daily with lantus solostar  . Magnesium 250 MG TABS Take 250 mg by mouth  daily.  . metFORMIN (GLUCOPHAGE) 500 MG tablet Take 1 Tablet by mouth once daily with BREAKFAST  . nabumetone (RELAFEN) 500 MG tablet Take 1 tablet (500 mg total) by mouth 2 (two) times daily. For muscle and joint pain  . nitrofurantoin, macrocrystal-monohydrate, (MACROBID) 100 MG capsule Take 1 capsule (100 mg total) by mouth at bedtime.  Marland Kitchen omeprazole (PRILOSEC) 20 MG capsule Take 1 Capsule by mouth 2 times a day BEFORE meals  . rOPINIRole (REQUIP) 0.25 MG tablet take 1  Tablet by mouth 3 times daily for leg cramps (Patient taking differently: Take 0.25 mg by mouth 2 (two) times daily. take 1  Tablet by mouth 3 times daily for leg cramps)  . SUPER B COMPLEX/C PO Take 1 tablet by mouth daily.  Marland Kitchen triamcinolone (KENALOG) 0.025 % ointment Apply 1 application topically 2 (two) times daily. (Patient taking differently: Apply 1 application topically as needed. )   No facility-administered encounter medications on file as of 05/20/2018.     Allergies  Allergen Reactions  . Diltiazem Hcl     Red itchy rash started a few hours after taking short acting 120mg  dilt tabs  . Ace Inhibitors Cough    Review of Systems  Constitutional: Negative for chills, fatigue and fever.  Respiratory: Positive for shortness of breath (intermittent with exertion). Negative for cough, chest tightness and wheezing.   Cardiovascular: Positive for leg swelling. Negative for chest pain and palpitations.  Genitourinary: Negative for decreased urine volume and difficulty urinating.  Neurological: Negative for dizziness, weakness, light-headedness and headaches.  Psychiatric/Behavioral: Negative for confusion.  All other systems reviewed and are negative.       Objective:    BP (!) 171/86   Pulse 66   Temp 98.1 F (36.7 C) (Oral)   Ht 4\' 10"  (1.473 m)   Wt 152 lb (68.9 kg)   BMI 31.77 kg/m    Wt Readings from Last 3 Encounters:  05/20/18 152 lb (68.9 kg)  05/13/18 153 lb (69.4 kg)  05/12/18 153 lb (69.4 kg)     Physical Exam Vitals signs and nursing note reviewed.  Constitutional:      General: She is not in acute distress.    Appearance: Normal appearance. She is not ill-appearing or toxic-appearing.  HENT:     Head: Normocephalic and atraumatic.  Neck:     Musculoskeletal: Neck supple.     Vascular: No carotid bruit or JVD.  Trachea: Trachea and phonation normal.  Cardiovascular:     Rate and Rhythm: Normal rate. Rhythm regularly irregular.     Heart sounds: No murmur. No friction rub. No gallop.   Pulmonary:     Effort: Pulmonary effort is normal. No respiratory distress.     Breath sounds: Normal breath sounds.  Musculoskeletal:     Right lower leg: 1+ Edema present.     Left lower leg: 2+ Edema present.  Skin:    General: Skin is warm and dry.     Capillary Refill: Capillary refill takes less than 2 seconds.  Neurological:     General: No focal deficit present.     Mental Status: She is alert.  Psychiatric:        Mood and Affect: Mood normal.        Behavior: Behavior normal. Behavior is cooperative.        Thought Content: Thought content normal.        Judgment: Judgment normal.     Results for orders placed or performed during the hospital encounter of 05/13/18  Urine culture  Result Value Ref Range   Specimen Description      URINE, CLEAN CATCH Performed at [facility], [address] [place], [place] [address]    Special Requests      NONE Performed at [facility], [address], [place], [place] [address]    Culture 40,000 COLONIES/mL ENTEROCOCCUS FAECALIS (A)    Report Status 05/16/2018 FINAL    Organism ID, Bacteria ENTEROCOCCUS FAECALIS (A)       Susceptibility   Enterococcus faecalis - MIC*    AMPICILLIN <=2 SENSITIVE Sensitive     LEVOFLOXACIN >=8 RESISTANT Resistant     NITROFURANTOIN <=16 SENSITIVE Sensitive     VANCOMYCIN <=0.5 SENSITIVE Sensitive     * 40,000 COLONIES/mL ENTEROCOCCUS FAECALIS  Basic metabolic panel  Result Value Ref  Range   Sodium 136 135 - 145 mmol/L   Potassium 3.7 3.5 - 5.1 mmol/L   Chloride 99 98 - 111 mmol/L   CO2 29 22 - 32 mmol/L   Glucose, Bld 145 (H) 70 - 99 mg/dL   BUN 27 (H) 8 - 23 mg/dL   Creatinine, Ser 1.04 (H) 0.44 - 1.00 mg/dL   Calcium 9.1 8.9 - 10.3 mg/dL   GFR calc non Af Amer 47 (L) >60 mL/min   GFR calc Af Amer 55 (L) >60 mL/min   Anion gap 8 5 - 15  CBC with Differential  Result Value Ref Range   WBC 7.4 4.0 - 10.5 K/uL   RBC 5.21 (H) 3.87 - 5.11 MIL/uL   Hemoglobin 12.3 12.0 - 15.0 g/dL   HCT 41.7 36.0 - 46.0 %   MCV 80.0 80.0 - 100.0 fL   MCH 23.6 (L) 26.0 - 34.0 pg   MCHC 29.5 (L) 30.0 - 36.0 g/dL   RDW 17.6 (H) 11.5 - 15.5 %   Platelets 227 150 - 400 K/uL   nRBC 0.0 0.0 - 0.2 %   Neutrophils Relative % 65 %   Neutro Abs 4.7 1.7 - 7.7 K/uL   Lymphocytes Relative 17 %   Lymphs Abs 1.3 0.7 - 4.0 K/uL   Monocytes Relative 14 %   Monocytes Absolute 1.0 0.1 - 1.0 K/uL   Eosinophils Relative 4 %   Eosinophils Absolute 0.3 0.0 - 0.5 K/uL   Basophils Relative 0 %   Basophils Absolute 0.0 0.0 - 0.1 K/uL   Immature Granulocytes 0 %  Abs Immature Granulocytes 0.02 0.00 - 0.07 K/uL  Urinalysis, Routine w reflex microscopic  Result Value Ref Range   Color, Urine AMBER (A) YELLOW   APPearance CLOUDY (A) CLEAR   Specific Gravity, Urine 1.012 1.005 - 1.030   pH 5.0 5.0 - 8.0   Glucose, UA NEGATIVE NEGATIVE mg/dL   Hgb urine dipstick NEGATIVE NEGATIVE   Bilirubin Urine NEGATIVE NEGATIVE   Ketones, ur NEGATIVE NEGATIVE mg/dL   Protein, ur NEGATIVE NEGATIVE mg/dL   Nitrite NEGATIVE NEGATIVE   Leukocytes, UA MODERATE (A) NEGATIVE   RBC / HPF 0-5 0 - 5 RBC/hpf   WBC, UA 11-20 0 - 5 WBC/hpf   Bacteria, UA RARE (A) NONE SEEN   Squamous Epithelial / LPF 6-10 0 - 5   Mucus PRESENT    Hyaline Casts, UA PRESENT        Pertinent labs & imaging results that were available during my care of the patient were reviewed by me and considered in my medical decision  making.  Assessment & Plan:  [name] was seen today for er followup.  Diagnoses and all orders for this visit:  Kidney function abnormal Pt has a BMP order from cardiology. Will have labs drawn today. Pt states she is not taking her lasix and is no longer taking the chlorthalidone.     Continue all other maintenance medications.  Follow up plan: Return in about 6 weeks (around 07/01/2018), or if symptoms worsen or fail to improve.   The above assessment and management plan was discussed with the patient. The patient verbalized understanding of and has agreed to the management plan. Patient is aware to call the clinic if symptoms persist or worsen. Patient is aware when to return to the clinic for a follow-up visit. Patient educated on when it is appropriate to go to the emergency department.   Monia Pouch, FNP-C [facility] [phone]

## 2018-05-21 ENCOUNTER — Ambulatory Visit: Payer: PPO | Admitting: Family Medicine

## 2018-05-21 LAB — BASIC METABOLIC PANEL
BUN/Creatinine Ratio: 22 (ref 12–28)
BUN: 21 mg/dL (ref 10–36)
CO2: 27 mmol/L (ref 20–29)
Calcium: 9.9 mg/dL (ref 8.7–10.3)
Chloride: 98 mmol/L (ref 96–106)
Creatinine, Ser: 0.94 mg/dL (ref 0.57–1.00)
GFR calc Af Amer: 62 mL/min/{1.73_m2} (ref >59)
GFR, EST NON AFRICAN AMERICAN: 54 mL/min/{1.73_m2} — AB (ref >59)
Glucose: 135 mg/dL — ABNORMAL HIGH (ref 65–99)
Potassium: 4 mmol/L (ref 3.5–5.2)
Sodium: 141 mmol/L (ref 134–144)

## 2018-05-21 NOTE — Patient Outreach (Signed)
[facility]) Care Management  05/21/2018  [name] 1925-09-09 [id number]   TELEPHONE SCREENING Referral date: 05/14/18 Referral source: nurse call line Referral reason: unable to urinate Insurance: health team advantage Attempt #1  Telephone call to patient regarding nurse call line referral. Contact answering phone states patient is not at home. HIPAA compliant voice message left with call back phone number. Marland Kitchen   PLAN: RNCM will attempt 2nd telephone call to patient within 4 business days. RNCM will send patient outreach letter to attempt contact  Quinn Plowman RN,BSN,CCM [facility] Telephonic  [phone]

## 2018-05-25 ENCOUNTER — Other Ambulatory Visit: Payer: Self-pay

## 2018-05-25 NOTE — Patient Outreach (Signed)
[facility]) Care Management  05/25/2018  TYLICIA SHERMAN [birth year] [id number]  TELEPHONE SCREENING Referral date: 05/14/18 Referral source: nurse call line Referral reason: unable to urinate Insurance: health team advantage  Telephone call to patient regarding nurse call line referral. HIPAA verified.  Explained reason for call. Patient states she is under the care of her cardiologist and primary MD. She states that she is urinating fine now and not having any problems. .  She states she makes sure that she goes every 4 hours.  Patient states she has her follow up appointments scheduled and transportation to her appointments. Patient states she takes her medications as prescribed. Patient denies any further needs or concerns.   PLAN; RNCM will close patient due to patient being assessed and having no further needs.   Quinn Plowman RN,BSN,CCM [facility] Telephonic  [phone]

## 2018-05-26 ENCOUNTER — Other Ambulatory Visit: Payer: Self-pay | Admitting: Family Medicine

## 2018-06-03 ENCOUNTER — Other Ambulatory Visit: Payer: Self-pay | Admitting: Family Medicine

## 2018-06-03 ENCOUNTER — Telehealth: Payer: Self-pay | Admitting: *Deleted

## 2018-06-03 NOTE — Telephone Encounter (Signed)
She can increase this to 0.5 mg in the morning to see if beneficial. If not improving with this change, she will need to make an appointment.

## 2018-06-03 NOTE — Telephone Encounter (Signed)
Pt is having continued leg cramps Worse in the AM Currently taking Ropinirole 0.25mg  BID Please advise

## 2018-06-04 ENCOUNTER — Encounter: Payer: Self-pay | Admitting: *Deleted

## 2018-06-04 NOTE — Telephone Encounter (Signed)
Pt informed of recommendation Verbalizes understanding

## 2018-06-07 ENCOUNTER — Other Ambulatory Visit: Payer: Self-pay | Admitting: Family Medicine

## 2018-06-10 ENCOUNTER — Other Ambulatory Visit: Payer: Self-pay | Admitting: Family Medicine

## 2018-06-15 ENCOUNTER — Other Ambulatory Visit: Payer: Self-pay | Admitting: Family Medicine

## 2018-07-05 ENCOUNTER — Other Ambulatory Visit: Payer: Self-pay | Admitting: Family Medicine

## 2018-07-05 ENCOUNTER — Telehealth: Payer: Self-pay | Admitting: *Deleted

## 2018-07-05 DIAGNOSIS — R252 Cramp and spasm: Secondary | ICD-10-CM

## 2018-07-05 MED ORDER — ROPINIROLE HCL 0.25 MG PO TABS: 0.2500 mg | ORAL_TABLET | Freq: Three times a day (TID) | ORAL | 0 refills | Status: DC

## 2018-07-05 NOTE — Telephone Encounter (Signed)
New RX sent

## 2018-07-05 NOTE — Telephone Encounter (Signed)
Fax Clarification from [facility] Rx Ropinirole HCL 0.25 mg 1 tab 2-3 times a day for leg cramps Pt states she should be taking 1 in morning, 1 at lunch time, and 2 at bedtime Please advise and send new Rx if appropriate

## 2018-07-14 ENCOUNTER — Ambulatory Visit: Payer: PPO | Admitting: Cardiology

## 2018-07-14 ENCOUNTER — Other Ambulatory Visit: Payer: Self-pay | Admitting: Family Medicine

## 2018-07-16 ENCOUNTER — Ambulatory Visit: Payer: Self-pay | Admitting: *Deleted

## 2018-07-16 ENCOUNTER — Encounter: Payer: Self-pay | Admitting: *Deleted

## 2018-07-16 ENCOUNTER — Ambulatory Visit (INDEPENDENT_AMBULATORY_CARE_PROVIDER_SITE_OTHER): Payer: PPO | Admitting: *Deleted

## 2018-07-16 DIAGNOSIS — M150 Primary generalized (osteo)arthritis: Secondary | ICD-10-CM | POA: Diagnosis not present

## 2018-07-16 DIAGNOSIS — I5033 Acute on chronic diastolic (congestive) heart failure: Secondary | ICD-10-CM

## 2018-07-16 DIAGNOSIS — J8410 Pulmonary fibrosis, unspecified: Secondary | ICD-10-CM

## 2018-07-16 DIAGNOSIS — I10 Essential (primary) hypertension: Secondary | ICD-10-CM

## 2018-07-16 DIAGNOSIS — I4891 Unspecified atrial fibrillation: Secondary | ICD-10-CM

## 2018-07-16 DIAGNOSIS — E1142 Type 2 diabetes mellitus with diabetic polyneuropathy: Secondary | ICD-10-CM | POA: Diagnosis not present

## 2018-07-16 DIAGNOSIS — M159 Polyosteoarthritis, unspecified: Secondary | ICD-10-CM

## 2018-07-16 NOTE — Patient Instructions (Signed)
Visit Information  [name] was given information about Chronic Care Management services today including:  1. CCM service includes personalized support from designated clinical staff supervised by her physician, including individualized plan of care and coordination with other care providers 2. 24/7 contact phone numbers for assistance for urgent and routine care needs. 3. Service will only be billed when office clinical staff spend 20 minutes or more in a month to coordinate care. 4. Only one practitioner may furnish and bill the service in a calendar month. 5. The patient may stop CCM services at any time (effective at the end of the month) by phone call to the office staff. 6. The patient will be responsible for cost sharing (co-pay) of up to 20% of the service fee (after annual deductible is met).  Patient agreed to services and verbal consent obtained.     Goals Addressed            This Visit's Progress   . "I want this joint pain to improve" (pt-stated)       Current Barriers:  Marland Kitchen Knowledge Deficits related to appropriate self management strategies to treat joint pain  . Knowledge deficit related to prescribed medications for joint pain  Nurse Case Manager Clinical Goal(s):  Marland Kitchen Over the next 30 days, patient will verbalize understanding of plan for osteoarthritis pain management . Over the next 30 days, patient will verbalize basic understanding of osteoarthritis disease process and self health management plan as evidenced by verbal report of decreased pain level.  Interventions:  . Evaluation of current treatment plan related to osteoarthritis and patient's adherence to plan as established by provider. o Patient has an orthopedic specialist who recommended shoulder replacement for chronic shoulder pain and decreased ROM. She is not interested in surgery and there is no other plan in place at this time.  o She is taking relafen for joint and muscle pain and the dosage was  recently increased . Advised patient to continue current medications and to follow up with PCP and orthopedic specialist as needed . Reviewed medications with patient and discussed possible addition of OTC or prescription rx to help manage pain . Discussed plans with patient for ongoing care management follow up and provided patient with direct contact information for care management team   Over the next week RNCM will collaborate with PCP regarding osteoarthritis treatment options.  Patient Self Care Activities:  . Self administers medications as prescribed . Attends all scheduled provider appointments . Calls pharmacy for medication refills . Attends church or other social activities . Performs ADL's independently . Performs IADL's independently . Calls provider office for new concerns or questions  Initial goal documentation 07/16/2018 Chong Sicilian, RN      . "I want to make sure that I'm using my insulin correctly" (pt-stated)       Current Barriers:  Marland Kitchen Knowledge Deficits related to proper insulin administration techniques  Nurse Case Manager Clinical Goal(s):   Over the next 30 days, patient will work with [facility] to address needs related to insulin administration  Interventions:  . Reviewed medications with patient and discussed proper site rotations with insulin injection o Can inject inusulin into the front and outer sides of thighs. Avoid the inside of the thighs.  o Can inject insulin into the abdomen making sure to stay at least 2 inches away from umbilicus  o Rotate injection sites with each injection  Patient Self Care Activities:  . Self administers medications as prescribed . Attends all  scheduled provider appointments . Calls pharmacy for medication refills . Attends church or other social activities . Performs ADL's independently . Performs IADL's independently . Calls provider office for new concerns or questions  Initial goal documentation 07/16/2018 Chong Sicilian, RN      . "I would like to have some help with chores around my home" (pt-stated)       Current Barriers:  Marland Kitchen Knowledge Deficits related to resource options available to her . Lacks caregiver support. Son is available by phone but does not live locally.  . 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   Nurse Case Manager Clinical Goal(s):   Marland Kitchen Over the next 30 days, patient will work with CM clinical social worker to obtain services to help in her home.   Interventions:  . Collaborated with Theadore Nan, [name] regarding community resources  Patient Self Care Activities:  . Self administers medications as prescribed . Attends all scheduled provider appointments . Calls pharmacy for medication refills . Attends church or other social activities . Performs ADL's independently . Performs IADL's independently . Calls provider office for new concerns or questions  Initial goal documentation 07/16/2018 Chong Sicilian, RN         The patient verbalized understanding of instructions provided today and declined a print copy of patient instruction materials.   The CM team will reach out to the patient again over the next 14 days.   Chong Sicilian, RN-BC, BSN Nurse Case Manager [place] [phone]

## 2018-07-16 NOTE — Chronic Care Management (AMB) (Signed)
  Chronic Care Management   Outreach Note  07/16/2018 Name: [name] MRN: [id number] DOB: Jan 10, 1926  Referred by: Baruch Gouty, FNP Reason for referral : Chronic Care Management (Insult telephone outreach to offer CCM services)   An unsuccessful telephone outreach to [name] was made today.   [name] is a 85 year old female primary care patient of Darla Lesches, [place] who was referred to the case management team by her [employer] healthplan for assistance with chronic care management and care coordination needs related to hypertension, Afib, CHF, DM, pulmonary fibrosis, and osteoarthritis. Also notable is her history of CVA and breast cancer.    Follow Up Plan:  HIPAA compliant voicemail left requesting patient to return my call.  The CM team will reach out to the patient again over the next 7 days.    Chong Sicilian, RN-BC, BSN Nurse Case Manager [place] [phone]

## 2018-07-16 NOTE — Addendum Note (Signed)
Addended by: Ilean China on: 07/16/2018 02:22 PM   Modules accepted: Orders

## 2018-07-16 NOTE — Chronic Care Management (AMB) (Signed)
Chronic Care Management   Initial Telephone Outreach Note  07/16/2018 Name: [name] MRN: [id number] DOB: 01-07-1926  Referred by: Joan Gouty, [name] Reason for referral : Chronic Care Management (initial telephone outreach)   [name] is a 85 y.o. year old female who is a primary care patient of Joan Gouty, [name]. The CCM team was consulted for assistance with chronic disease management and care coordination needs related to hypertension, Afib, CHF, DM, pulmonary fibrosis, and osteoarthritis. Also notable is her history of CVA and breast cancer.  Review of patient status, including review of consultants reports, relevant laboratory and other test results, and collaboration with appropriate care team members and the patient's provider was performed as part of comprehensive patient evaluation and provision of chronic care management services.    SDOH (Social Determinants of Health) screening performed today. See Care Plan Entry related to challenges with: Physical Activity and desire to have someone help her in her home.  Objective:   Goals Addressed    . "I want to make sure that I'm using my insulin correctly" (pt-stated)       Patient has been a diabetic for many years but states that hasn't really asked about how to inject her insulin. She always injects in her abdomen and she does move around a little. She does not use her thighs.   Current Barriers:  [name] Kitchen Knowledge Deficits related to proper insulin administration techniques  Nurse Case Manager Clinical Goal(s):   Over the next 30 days, patient will work with [name] to address needs related to insulin administration  Interventions:  . Reviewed medications with patient and discussed proper site rotations with insulin injection o Can inject inusulin into the front and outer sides of thighs. Avoid the inside of the thighs.  o Can inject insulin into the abdomen making sure to stay at least 2 inches away from umbilicus   o Rotate injection sites with each injection  Patient Self Care Activities:  . Self administers medications as prescribed . Attends all scheduled provider appointments . Calls pharmacy for medication refills . Attends church or other social activities . Performs ADL's independently . Performs IADL's independently . Calls provider office for new concerns or questions  Initial goal documentation 07/16/2018 Joan Sicilian, RN     . "I would like to have some help with chores around my home" (pt-stated)       Limited ROM in her shoulder and diffuse joint pain due to OA limit her ability to clean her home properly. She is also not able to reach things in her upper cabinets and has to wait until someone visits to get it down for her. She is able to drive and performs her own ADLs and IADLs. She is just limited in how she performs these tasks. She did get a housekeeping quote and after outlining what she would like done, the housekeeper quoted her $130 an hour. Ms [name] is unable to afford that and would like to see if there are any options available to her. She not have any retirement or savings and her only income is her husband's Control and instrumentation engineer.   Current Barriers:  [name] Kitchen Knowledge Deficits related to resource options available to her . Lacks caregiver support. Son is available by phone but does not live locally.  . 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   Nurse Case Manager Clinical Goal(s):   [name] Kitchen Over the next 30  days, patient will work with CM clinical social worker to obtain services to help in her home.   Interventions:  . Collaborated with [name], [name] regarding community resources  Patient Self Care Activities:  . Self administers medications as prescribed . Attends all scheduled provider appointments . Calls pharmacy for medication refills . Attends church or other social activities . Performs ADL's  independently . Performs IADL's independently . Calls provider office for new concerns or questions  Initial goal documentation 07/16/2018 Joan Sicilian, RN      . "I'd like for my joint pain to improve"       Hx of osteoarthritis. Needs a shoulder replacement but refuses surgery. Conservative treatments no longer help. She is taking Relafen, which was recently increased, but it is not working as well as she would like.   Current Barriers:  [name] Kitchen Knowledge Deficits related to appropriate self management strategies to treat joint pain  Nurse Case Manager Clinical Goal(s):  [name] Kitchen Over the next 30 days, patient will verbalize understanding of plan for osteoarthritis pain management . Over the next 30 days, patient will verbalize basic understanding of osteoarthritis disease process and self health management plan as evidenced by verbal report of decreased pain level.  Interventions:  . Evaluation of current treatment plan related to osteoarthritis and patient's adherence to plan as established by provider. o Patient has an orthopedic specialist who recommended shoulder replacement for chronic shoulder pain and decreased ROM. She is not interested in surgery and there is no other plan in place at this time.  . Advised patient to continue current medications and to follow up with PCP and orthopedic specialist as needed . Reviewed medications with patient and discussed possible addition of OTC or prescription rx to help manage pain . Discussed plans with patient for ongoing care management follow up and provided patient with direct contact information for care management team   Over the next week RNCM will collaborate with PCP regarding osteoarthritis treatment options.  Patient Self Care Activities:  . Self administers medications as prescribed . Attends all scheduled provider appointments . Calls pharmacy for medication refills . Attends church or other social activities . Performs ADL's  independently . Performs IADL's independently . Calls provider office for new concerns or questions  Initial goal documentation 07/16/2018 Joan Sicilian, RN         Plan Ms. Aytes was given information about Chronic Care Management services today including:  1. CCM service includes personalized support from designated clinical staff supervised by her physician, including individualized plan of care and coordination with other care providers 2. 24/7 contact phone numbers for assistance for urgent and routine care needs. 3. Service will only be billed when office clinical staff spend 20 minutes or more in a month to coordinate care. 4. Only one practitioner may furnish and bill the service in a calendar month. 5. The patient may stop CCM services at any time (effective at the end of the month) by phone call to the office staff. 6. The patient will be responsible for cost sharing (co-pay) of up to 20% of the service fee (after annual deductible is met). With her plan, HTA, there is no cost to the patient. HTA covers CCM services at 100%.  Patient agreed to services and verbal consent obtained.    The CM team will reach out to the patient again over the next 14 days.   RNCM will collaborate with PCP regarding OA treatment options  RNCM will collaborate with [name]  regarding community resources for in home assistance  Joan Sicilian, RN-BC, BSN Nurse Case Manager [place] [phone]

## 2018-07-16 NOTE — Patient Instructions (Signed)
  Follow Up Plan:  HIPAA compliant voicemail left requesting patient to return my call.  The CM team will reach out to the patient again over the next 7 days.    Chong Sicilian, RN-BC, BSN Nurse Case Manager [place] [phone]

## 2018-07-22 ENCOUNTER — Ambulatory Visit: Payer: PPO | Admitting: *Deleted

## 2018-07-27 ENCOUNTER — Telehealth: Payer: Self-pay | Admitting: Family Medicine

## 2018-07-27 MED ORDER — APIXABAN 5 MG PO TABS: 5.0000 mg | ORAL_TABLET | Freq: Two times a day (BID) | ORAL | 2 refills | Status: DC

## 2018-07-27 NOTE — Telephone Encounter (Signed)
Pt aware refill sent to pharmacy 

## 2018-07-27 NOTE — Telephone Encounter (Signed)
What is the name of the medication? eloquis  Have you contacted your pharmacy to request a refill? yes  Which pharmacy would you like this sent to? [facility]   Patient notified that their request is being sent to the clinical staff for review and that they should receive a call once it is complete. If they do not receive a call within 24 hours they can check with their pharmacy or our office.

## 2018-07-28 ENCOUNTER — Telehealth: Payer: Self-pay | Admitting: Family Medicine

## 2018-07-29 ENCOUNTER — Ambulatory Visit: Payer: PPO | Admitting: Family Medicine

## 2018-07-29 NOTE — Telephone Encounter (Signed)
Spoke with pt regarding coupon for Eloquis Pt informed coupon not valid for Medicare pts

## 2018-07-30 ENCOUNTER — Ambulatory Visit: Payer: PPO | Admitting: Family Medicine

## 2018-07-30 ENCOUNTER — Encounter: Payer: Self-pay | Admitting: Cardiology

## 2018-07-30 NOTE — Progress Notes (Signed)
Erroneous encounter

## 2018-08-11 ENCOUNTER — Ambulatory Visit: Payer: PPO | Admitting: Cardiology

## 2018-08-12 ENCOUNTER — Ambulatory Visit (INDEPENDENT_AMBULATORY_CARE_PROVIDER_SITE_OTHER): Payer: PPO | Admitting: Family Medicine

## 2018-08-12 ENCOUNTER — Encounter: Payer: Self-pay | Admitting: Family Medicine

## 2018-08-12 ENCOUNTER — Other Ambulatory Visit: Payer: Self-pay

## 2018-08-12 DIAGNOSIS — I10 Essential (primary) hypertension: Secondary | ICD-10-CM

## 2018-08-12 DIAGNOSIS — E1159 Type 2 diabetes mellitus with other circulatory complications: Secondary | ICD-10-CM | POA: Diagnosis not present

## 2018-08-12 DIAGNOSIS — I152 Hypertension secondary to endocrine disorders: Secondary | ICD-10-CM

## 2018-08-12 DIAGNOSIS — K219 Gastro-esophageal reflux disease without esophagitis: Secondary | ICD-10-CM | POA: Diagnosis not present

## 2018-08-12 DIAGNOSIS — Z794 Long term (current) use of insulin: Secondary | ICD-10-CM

## 2018-08-12 DIAGNOSIS — E1140 Type 2 diabetes mellitus with diabetic neuropathy, unspecified: Secondary | ICD-10-CM | POA: Diagnosis not present

## 2018-08-12 NOTE — Progress Notes (Signed)
Virtual Visit via telephone Note Due to COVID-19, visit is conducted virtually and was requested by patient.  I connected with [name] on 08/12/18 at 1320 by telephone and verified that I am speaking with the correct person using two identifiers. [name] is currently located at home and no one is currently with them during visit. The provider, Monia Pouch, FNP is located in their office at time of visit.  I discussed the limitations, risks, security and privacy concerns of performing an evaluation and management service by telephone and the availability of in person appointments. I also discussed with the patient that there may be a patient responsible charge related to this service. The patient expressed understanding and agreed to proceed.  Subjective:  Patient ID: [name], female    DOB: June 01, 1925, 85 y.o.   MRN: [id number]  Chief Complaint:  Medical Management of Chronic Issues   HPI: [name] is a 85 y.o. female presenting on 08/12/2018 for Medical Management of Chronic Issues   Hypertension associated with diabetes ([place]) Complaint with meds - Yes Checking BP at home ranging 140/80 Exercising Regularly - Yes Watching Salt intake - Yes Pertinent ROS:  Headache - No Chest pain - No Dyspnea - No Palpitations - No LE edema - No They report good compliance with medications and can restate their regimen by memory. No medication side effects.  BP Readings from Last 3 Encounters:  05/20/18 (!) 171/86  05/13/18 (!) 163/77  05/12/18 (!) 168/80    Type 2 diabetes mellitus with diabetic neuropathy, with long-term current use of insulin (HCC) Diabetes mellitus 2 Compliant with meds - Yes Checking CBGs? Yes  Fasting avg - 115  Postprandial average - 130 Exercising regularly? - Yes Watching carbohydrate intake? - Yes Neuropathy ? - Yes Hypoglycemic events - No Pertinent ROS:  Polyuria - No Polydipsia - No Vision problems - No   Gastroesophageal reflux disease without esophagitis Compliant with medications. Symptoms well controlled. No sore throat, cough, voice changes, or hemoptysis. No epigastric pain or changes in stool color.     Relevant past medical, surgical, family, and social history reviewed and updated as indicated.  Allergies and medications reviewed and updated.   Past Medical History:  Diagnosis Date  . Arthritis   . Cancer ([place])    breast  . Cataract   . Diabetes mellitus without complication ([place])   . Frequent UTI   . Hyperlipidemia   . Hypertension   . Neck pain   . Scoliosis   . Stroke [facility])     Past Surgical History:  Procedure Laterality Date  . APPENDECTOMY    . BREAST LUMPECTOMY Left   . CHOLECYSTECTOMY    . EYE SURGERY    . JOINT REPLACEMENT     bilat knee   . REPLACEMENT TOTAL KNEE BILATERAL Bilateral   . TONSILLECTOMY      Social History   Socioeconomic History  . Marital status: Widowed    Spouse name: Not on file  . Number of children: 2  . Years of education: 34  . Highest education level: Some college, no degree  Occupational History  . Occupation: Retired    Fish farm manager: Scientist, forensic    Comment: Network engineer  Social Needs  . Financial resource strain: Not very hard  . Food insecurity:    Worry: Never true    Inability: Never true  . Transportation needs:    Medical: No    Non-medical: No  Tobacco Use  .  Smoking status: Never Smoker  . Smokeless tobacco: Never Used  Substance and Sexual Activity  . Alcohol use: No    Alcohol/week: 0.0 standard drinks  . Drug use: No  . Sexual activity: Never  Lifestyle  . Physical activity:    Days per week: 0 days    Minutes per session: 0 min  . Stress: Only a little  Relationships  . Social connections:    Talks on phone: Not on file    Gets together: Not on file    Attends religious service: Not on file    Active member of club or organization: Not on file    Attends meetings of clubs or organizations:  Not on file    Relationship status: Not on file  . Intimate partner violence:    Fear of current or ex partner: No    Emotionally abused: No    Physically abused: No    Forced sexual activity: No  Other Topics Concern  . Not on file  Social History Narrative   Lives alone in a one story home.  Husband passed away. She has two sons 2 sons.  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  Education: specialty courses with [employer], high school education.    Outpatient Encounter Medications as of 08/12/2018  Medication Sig  . apixaban (ELIQUIS) 5 MG TABS tablet Take 1 tablet (5 mg total) by mouth 2 (two) times daily.  . Cholecalciferol (VITAMIN D3) 5000 units CAPS Take 2,000 Units by mouth daily.   . D-Mannose 500 MG CAPS Take 500 mg by mouth daily.  Marland Kitchen diltiazem (DILTIAZEM CD) 240 MG 24 hr capsule Take 240 mg by mouth 2 (two) times daily.  . furosemide (LASIX) 40 MG tablet Take 40 mg by mouth daily.  Marland Kitchen gabapentin (NEURONTIN) 100 MG capsule Take 300 mg by mouth 2 (two) times daily.  Marland Kitchen glucose blood (ONE TOUCH ULTRA TEST) test strip USE TO check blood sugars twice daily  . hydrALAZINE (APRESOLINE) 25 MG tablet Take 25 mg twice a day  . Insulin Glargine (LANTUS SOLOSTAR) 100 UNIT/ML Solostar Pen INJECT 50 UNITS ONCE DAILY AT 10PM  . Insulin Pen Needle 31G X 8 MM MISC Use once daily with lantus solostar  . Magnesium 250 MG TABS Take 250 mg by mouth daily.  . metFORMIN (GLUCOPHAGE) 500 MG tablet Take 1 Tablet by mouth once daily with BREAKFAST  . nabumetone (RELAFEN) 500 MG tablet Take 1 tablet (500 mg total) by mouth 2 (two) times daily. For muscle and joint pain  . omeprazole (PRILOSEC) 20 MG capsule Take 1 Capsule by mouth 2 times a day BEFORE meals  . rOPINIRole (REQUIP) 0.25 MG tablet Take 1 tablet (0.25 mg total) by mouth 3 (three) times daily for 30 days. Take one tablet in the morning, one tablet at noon and two tablets at bedtime.  . SUPER B COMPLEX/C PO Take 1 tablet by mouth daily.  Marland Kitchen  triamcinolone (KENALOG) 0.025 % ointment Apply 1 application topically 2 (two) times daily. (Patient taking differently: Apply 1 application topically as needed. )   No facility-administered encounter medications on file as of 08/12/2018.     Allergies  Allergen Reactions  . Diltiazem Hcl     Red itchy rash started a few hours after taking short acting 120mg  dilt tabs  . Ace Inhibitors Cough    Review of Systems  Constitutional: Negative for appetite change, chills, fatigue and fever.  HENT: Negative for sore throat, trouble swallowing and voice  change.   Respiratory: Negative for cough, chest tightness and shortness of breath.   Cardiovascular: Negative for chest pain, palpitations and leg swelling.  Gastrointestinal: Negative for abdominal pain, anal bleeding, blood in stool, constipation, diarrhea, nausea and vomiting.  Endocrine: Negative for polydipsia, polyphagia and polyuria.  Genitourinary: Negative for decreased urine volume, difficulty urinating and dysuria.  Musculoskeletal: Negative for arthralgias, joint swelling and myalgias.  Neurological: Negative for dizziness, tremors, seizures, syncope, facial asymmetry, speech difficulty, weakness, light-headedness, numbness and headaches.  Hematological: Does not bruise/bleed easily.  Psychiatric/Behavioral: Negative for confusion.  All other systems reviewed and are negative.        Observations/Objective: No vital signs or physical exam, this was a telephone or virtual health encounter.  Pt alert and oriented, answers all questions appropriately, and able to speak in full sentences.    Assessment and Plan: Diagnoses and all orders for this visit:  Due to COVID-19, pt will hold off on blood work at this time.  Previous labs reviewed.  Hypertension associated with diabetes ([place]) Stable continue medications as prescribed. No refills needed today.   Type 2 diabetes mellitus with diabetic neuropathy, with long-term current  use of insulin (HCC) Last A1C 6.3. Blood sugars are good at home. Continue medications as prescribed. Report any persistent highs or lows. No refills needed today.   Gastroesophageal reflux disease without esophagitis Well controlled with medications. No refills needed today.     Follow Up Instructions: Return in about 3 months (around 11/11/2018).    I discussed the assessment and treatment plan with the patient. The patient was provided an opportunity to ask questions and all were answered. The patient agreed with the plan and demonstrated an understanding of the instructions.   The patient was advised to call back or seek an in-person evaluation if the symptoms worsen or if the condition fails to improve as anticipated.  The above assessment and management plan was discussed with the patient. The patient verbalized understanding of and has agreed to the management plan. Patient is aware to call the clinic if symptoms persist or worsen. Patient is aware when to return to the clinic for a follow-up visit. Patient educated on when it is appropriate to go to the emergency department.    I provided 15 minutes of non-face-to-face time during this encounter. The call started at 1320. The call ended at 1335.   Monia Pouch, FNP-C [facility] [address] [place], [place] [address] [phone]

## 2018-08-17 ENCOUNTER — Other Ambulatory Visit: Payer: Self-pay

## 2018-08-17 ENCOUNTER — Ambulatory Visit: Payer: PPO | Admitting: *Deleted

## 2018-08-17 ENCOUNTER — Ambulatory Visit (INDEPENDENT_AMBULATORY_CARE_PROVIDER_SITE_OTHER): Payer: PPO | Admitting: Licensed Clinical Social Worker

## 2018-08-17 DIAGNOSIS — E1159 Type 2 diabetes mellitus with other circulatory complications: Secondary | ICD-10-CM | POA: Diagnosis not present

## 2018-08-17 DIAGNOSIS — I5033 Acute on chronic diastolic (congestive) heart failure: Secondary | ICD-10-CM

## 2018-08-17 DIAGNOSIS — M150 Primary generalized (osteo)arthritis: Secondary | ICD-10-CM

## 2018-08-17 DIAGNOSIS — Z794 Long term (current) use of insulin: Secondary | ICD-10-CM | POA: Diagnosis not present

## 2018-08-17 DIAGNOSIS — I10 Essential (primary) hypertension: Secondary | ICD-10-CM | POA: Diagnosis not present

## 2018-08-17 DIAGNOSIS — J8410 Pulmonary fibrosis, unspecified: Secondary | ICD-10-CM

## 2018-08-17 DIAGNOSIS — E1140 Type 2 diabetes mellitus with diabetic neuropathy, unspecified: Secondary | ICD-10-CM

## 2018-08-17 DIAGNOSIS — Z853 Personal history of malignant neoplasm of breast: Secondary | ICD-10-CM | POA: Diagnosis not present

## 2018-08-17 DIAGNOSIS — M159 Polyosteoarthritis, unspecified: Secondary | ICD-10-CM

## 2018-08-17 DIAGNOSIS — K219 Gastro-esophageal reflux disease without esophagitis: Secondary | ICD-10-CM

## 2018-08-17 DIAGNOSIS — I4891 Unspecified atrial fibrillation: Secondary | ICD-10-CM

## 2018-08-17 DIAGNOSIS — Z604 Social exclusion and rejection: Secondary | ICD-10-CM

## 2018-08-17 NOTE — Patient Instructions (Addendum)
Visit Information  Goals Addressed    . "I wish I could get out of the house and go to the grocery store" (pt-stated)       Current Barriers:   Lacks caregiver support.    Social isolation due to [place] Stay at [facility] Orders  Nurse Case Manager Clinical Goal(s):  Marland Kitchen Over the next 7 days, patient will work with CM clinical social worker to address psychosocial issues related to social isolation.   Interventions:  . Advised patient to talk with friends/family on the phone often.  . Suggested that patient make a quick trip to the grocery store once or twice a week.  o She has an N95 mask. I advised her to wear it. o Only go in the store if it is not crowded o Limit distance from other people to 26ft or more o Wash hands often and thoroughly . Collaborated with [employer]" Forrest, [name] regarding psychosocial needs. . Social Work referral for social isolation.  Minette Brine information for CCM team provided . Patient encouraged to reach out with any medical or psychosocial needs  Patient Self Care Activities:  . Self administers medications as prescribed . Attends all scheduled provider appointments . Calls pharmacy for medication refills . Attends church or other social activities . Performs ADL's independently . Performs IADL's independently . Calls provider office for new concerns or questions       The patient verbalized understanding of instructions provided today and declined a print copy of patient instruction materials.   The CM team will reach out to the patient again over the next 7 days.    Chong Sicilian, RN-BC, BSN Nurse Case Manager [place] [phone]

## 2018-08-17 NOTE — Patient Instructions (Signed)
Licensed Clinical Social Worker Visit Information  Goals we discussed today:  Goals Addressed            This Visit's Progress   . "I wish I could get out of the house and go to the grocery store" (pt-stated)       Current Barriers:   Lacks caregiver support.    Social isolation due to [place] Stay at [facility] Orders  Nurse Case Manager Clinical Goal(s):  Marland Kitchen Over the next 7 days, patient will work with CM clinical social worker to address psychosocial issues related to social isolation.   Interventions:  . [name] also advised patient to talk with friends/family on the phone often for social/emotional support.  . Suggested that patient make a quick trip to the grocery store once or twice a week.  o She has an N95 mask. I advised her to wear it. o Only go in the store if it is not crowded o Limit distance from other people to 65ft or more o Wash hands often and thoroughly o [name] talked with client about calling in grocery order to local [place] store and then just driving to store to pay for ordered food and to pick it up . [name] collaborated with RNCM related to client needs.  . Patient encouraged to reach out with any medical or psychosocial needs . [name] provided counseling support for client  Patient Self Care Activities:  . Self administers medications as prescribed . Attends all scheduled provider appointments . Calls pharmacy for medication refills . Attends church or other social activities . Performs ADL's independently . Performs IADL's independently . Calls provider office for new concerns or questions  Initial goal documentation     Materials Provided: No  Follow Up Plan: [name] to call client in next 2 weeks to talk with client about social isolation issues and managing social isolation issues  The patient verbalized understanding of instructions provided today and declined a print copy of patient instruction materials.   Norva Riffle.Josue Kass MSW, [name] Licensed  Clinical Social Worker [facility] [phone]

## 2018-08-17 NOTE — Chronic Care Management (AMB) (Signed)
  Chronic Care Management   RN Case Management Telephone Follow Up Note   08/17/2018 Name: [name] MRN: [id number] DOB: Apr 05, 1926  Referred by: Baruch Gouty, FNP Reason for referral : Chronic Care Management (RNCM follow up telephone call)   [name] is a 85 y.o. year old female who is a primary care patient of Baruch Gouty, FNP. The CCM team was consulted for assistance with chronic disease management and care coordination needs.    Review of patient status, including review of consultants reports, relevant laboratory and other test results, and collaboration with appropriate care team members and the patient's provider was performed as part of comprehensive patient evaluation and provision of chronic care management services.    Subjective I spoke with [name] via telephone today. Our telephone call today mainly focused on social isolation. She has been socially distancing herself due to [place] and has been abiding by the Governor's Stay at Home order. She has only left her home once in the past 52 days and that was to drop something into the outside box at the post office. Her youngest son has brought some groceries by once and and a neighbor has dropped off some things as well. She had a friend visit but she had to talk with her through the screen door on the porch. [name] enjoys being social but is even somewhat isolated during normal conditions so she is struggling with the added precautions and restrictions. [name] enjoys going to [employer]. She likes to talk with the staff and pick out her own groceries. She is independent and really enjoys driving, which she says she can still do safely.   Objective [name] sounded calm and the tone of her voice was normal.   Assessment & Plan  . "I wish I could get out of the house and go to the grocery store" (pt-stated)       Current Barriers:   Lacks caregiver support.    Social isolation due to  [place] Stay at [facility] Orders  Nurse Case Manager Clinical Goal(s):  Marland Kitchen Over the next 7 days, patient will work with CM clinical social worker to address psychosocial issues related to social isolation.   Interventions:  . Advised patient to talk with friends/family on the phone often.  . Suggested that patient make a quick trip to the grocery store once or twice a week.  o She has an N95 mask. I advised her to wear it. o Only go in the store if it is not crowded o Limit distance from other people to 53ft or more o Wash hands often and thoroughly . Collaborated with [employer]" Forrest, [name] regarding psychosocial needs. . Social Work referral for social isolation.  Minette Brine information for CCM team provided . Patient encouraged to reach out with any medical or psychosocial needs  Patient Self Care Activities:  . Self administers medications as prescribed . Attends all scheduled provider appointments . Calls pharmacy for medication refills . Attends church or other social activities . Performs ADL's independently . Performs IADL's independently . Calls provider office for new concerns or questions          The CM team will reach out to the patient again over the next 7 days.    Chong Sicilian, RN-BC, BSN Nurse Case Manager [place] [phone]

## 2018-08-17 NOTE — Chronic Care Management (AMB) (Signed)
  Care Management Note   [name] is a 85 y.o. year old female who is a primary care patient of Baruch Gouty, FNP. The CM team was consulted for assistance with chronic disease management and care coordination.   I reached out to [name] by phone today.   Review of patient status, including review of consultants reports, relevant laboratory and other test results, and collaboration with appropriate care team members and the patient's provider was performed as part of comprehensive patient evaluation and provision of chronic care management services.   Social Determinants of Health:Risk for Stress; Risk for Social Isolation .    Office Visit from 04/16/2018 in [place]  PHQ-9 Total Score  6     Goals Addressed            This Visit's Progress   . "I wish I could get out of the house and go to the grocery store" (pt-stated)       Current Barriers:   Lacks caregiver support.    Social isolation due to [place] Stay at [facility] Orders  Nurse Case Manager Clinical Goal(s):  Marland Kitchen Over the next 7 days, patient will work with CM clinical social worker to address psychosocial issues related to social isolation.   Interventions:  . [name] also advised patient to talk with friends/family on the phone often for social/emotional support.  . Suggested that patient make a quick trip to the grocery store once or twice a week.  o She has an N95 mask. I advised her to wear it. o Only go in the store if it is not crowded o Limit distance from other people to 72ft or more o Wash hands often and thoroughly o [name] talked with client about calling in grocery order to local [place] store and then just driving to store to pay for ordered food and to pick it up . [name] collaborated with RNCM related to client needs.  . Patient encouraged to reach out with any medical or psychosocial needs . [name] provided counseling support for client  Patient Self Care Activities:   . Self administers medications as prescribed . Attends all scheduled provider appointments . Calls pharmacy for medication refills . Attends church or other social activities . Performs ADL's independently . Performs IADL's independently . Calls provider office for new concerns or questions  Initial goal documentation    Follow Up Plan: [name] to call client in next 2 weeks to talk with client about social isolation issues and addressing social isolation challenges  Norva Riffle.Traniyah Hallett MSW, [name] Licensed Clinical Social Worker [facility] Care Management [phone]

## 2018-08-31 ENCOUNTER — Ambulatory Visit: Payer: Self-pay | Admitting: Licensed Clinical Social Worker

## 2018-08-31 DIAGNOSIS — Z794 Long term (current) use of insulin: Secondary | ICD-10-CM

## 2018-08-31 DIAGNOSIS — M150 Primary generalized (osteo)arthritis: Secondary | ICD-10-CM

## 2018-08-31 DIAGNOSIS — M159 Polyosteoarthritis, unspecified: Secondary | ICD-10-CM

## 2018-08-31 DIAGNOSIS — E1159 Type 2 diabetes mellitus with other circulatory complications: Secondary | ICD-10-CM

## 2018-08-31 DIAGNOSIS — I10 Essential (primary) hypertension: Principal | ICD-10-CM

## 2018-08-31 DIAGNOSIS — E1140 Type 2 diabetes mellitus with diabetic neuropathy, unspecified: Secondary | ICD-10-CM

## 2018-08-31 DIAGNOSIS — I152 Hypertension secondary to endocrine disorders: Secondary | ICD-10-CM

## 2018-08-31 DIAGNOSIS — Z853 Personal history of malignant neoplasm of breast: Secondary | ICD-10-CM

## 2018-08-31 DIAGNOSIS — K219 Gastro-esophageal reflux disease without esophagitis: Secondary | ICD-10-CM

## 2018-08-31 DIAGNOSIS — I5033 Acute on chronic diastolic (congestive) heart failure: Secondary | ICD-10-CM

## 2018-08-31 DIAGNOSIS — J8410 Pulmonary fibrosis, unspecified: Secondary | ICD-10-CM

## 2018-08-31 DIAGNOSIS — Z604 Social exclusion and rejection: Secondary | ICD-10-CM

## 2018-08-31 NOTE — Patient Instructions (Addendum)
Licensed Clinical Social Worker Visit Information  Goals we discussed today:  Goals Addressed            This Visit's Progress   . "I wish I could get out of the house and go to the grocery store" (pt-stated)       Current Barriers:   Lacks caregiver support.    Social isolation due to [place] Stay at [facility] Orders  Nurse Case Manager Clinical Goal(s):  Marland Kitchen Over the next 30 days, patient will work with CM clinical social worker to address psychosocial issues related to social isolation.   Interventions:  . Advised patient to talk with friends/family on the phone often for social/emotional support.  . Suggested that patient make a quick trip to the grocery store once or twice a week.  o She has an N95 mask. I advised her to wear it. o Only go in the store if it is not crowded o Limit distance from other people to 6 feet or more o Wash hands often and thoroughly o Talked with client about calling in grocery order to local [place] store and then just driving to store to pay for ordered food and to pick it up . Encouraged client to communicate with RNCM related to client needs.  . Patient encouraged to reach out with any medical or psychosocial needs  .  Provided counseling support for client   .   Talked with client about her social support network   . Patient Self Care Activities:  . Self administers medications as prescribed . Attends all scheduled provider appointments . Calls pharmacy for medication refills . Attends church or other social activities . Performs ADL's independently . Performs IADL's independently . Calls provider office for new concerns or questions  Plan:  Client to attend scheduled medical appointments Client to communicate with RN CM to discuss nursing needs of client [name] to call client in next 3 weeks to talk with her about social isolation issues and possibility of her going occasionally to local grocery store to obtain food supplies   Initial goal  documentation     Materials Provided: No  Follow Up Plan: [name] to call client in next 3 weeks to talk with client about social isolation issues and possibility of client going occasionally to grocery store to obtain needed food items.  The patient verbalized understanding of instructions provided today and declined a print copy of patient instruction materials.   Norva Riffle.Mashawn Brazil MSW, [name] Licensed Clinical Social Worker [facility] Care [facility] [phone]

## 2018-08-31 NOTE — Chronic Care Management (AMB) (Signed)
  Care Management Note   [name] is a 85 y.o. year old female who is a primary care patient of Baruch Gouty, FNP. The CM team was consulted for assistance with chronic disease management and care coordination.   I reached out to [name] by phone today.   Review of patient status, including review of consultants reports, relevant laboratory and other test results, and collaboration with appropriate care team members and the patient's provider was performed as part of comprehensive patient evaluation and provision of chronic care management services.   Social Determinants of Health:Risk for Stress    Office Visit from 04/16/2018 in [place]  PHQ-9 Total Score  6     Goals Addressed            This Visit's Progress   . "I wish I could get out of the house and go to the grocery store" (pt-stated)       Current Barriers:   Lacks caregiver support.    Social isolation due to [place] Stay at [facility] Orders  Nurse Case Manager Clinical Goal(s):  Marland Kitchen Over the next 30 days, patient will work with CM clinical social worker to address psychosocial issues related to social isolation.   Interventions:  . Advised patient to talk with friends/family on the phone often for social/emotional support.  . Suggested that patient make a quick trip to the grocery store once or twice a week.  o She has an N95 mask. I advised her to wear it. o Only go in the store if it is not crowded o Limit distance from other people to 6 feet or more o Wash hands often and thoroughly o Talked with client about calling in grocery order to local [place] store and then just driving to store to pay for ordered food and to pick it up . Encouraged client to communicate with RNCM related to client needs.  . Patient encouraged to reach out with any medical or psychosocial needs  .  Provided counseling support for client   .  Talked with client about social support network of  client  Patient Self Care Activities:  . Self administers medications as prescribed . Attends all scheduled provider appointments . Calls pharmacy for medication refills . Performs ADL's independently . Performs IADL's independently . Calls provider office for new concerns or questions  Plan:  Client to attend scheduled medical appointments Client to communicate with RN CM to discuss nursing needs of client [name] to call client in next 3 weeks to talk with her about social isolation issues and possibility of her going occasionally to local grocery store to obtain food supplies   Initial goal documentation     Follow Up Plan: [name] to call client in next 3 weeks to talk with client about social isolation issues and possibility of her going occasionally to local grocery store to obtain neeed food items.  Norva Riffle.Makari Sanko MSW, [name] Licensed Clinical Social Worker [facility] [phone]

## 2018-09-10 ENCOUNTER — Other Ambulatory Visit: Payer: Self-pay

## 2018-09-10 ENCOUNTER — Encounter: Payer: Self-pay | Admitting: Family Medicine

## 2018-09-10 ENCOUNTER — Ambulatory Visit (INDEPENDENT_AMBULATORY_CARE_PROVIDER_SITE_OTHER): Payer: PPO | Admitting: Family Medicine

## 2018-09-10 DIAGNOSIS — M25561 Pain in right knee: Secondary | ICD-10-CM

## 2018-09-10 MED ORDER — DICLOFENAC SODIUM 1 % TD GEL: 2.0000 g | Freq: Four times a day (QID) | TRANSDERMAL | 0 refills | Status: DC

## 2018-09-10 NOTE — Progress Notes (Signed)
Virtual Visit via telephone Note Due to COVID-19, visit is conducted virtually and was requested by patient. This visit type was conducted due to national recommendations for restrictions regarding the COVID-19 Pandemic (e.g. social distancing) in an effort to limit this patient's exposure and mitigate transmission in our community. All issues noted in this document were discussed and addressed.  A physical exam was not performed with this format.   I connected with [name] on 09/10/18 at 0900 by telephone and verified that I am speaking with the correct person using two identifiers. [name] is currently located at home and no one is currently with them during visit. The provider, Monia Pouch, FNP is located in their office at time of visit.  I discussed the limitations, risks, security and privacy concerns of performing an evaluation and management service by telephone and the availability of in person appointments. I also discussed with the patient that there may be a patient responsible charge related to this service. The patient expressed understanding and agreed to proceed.  Subjective:  Patient ID: [name], female    DOB: Apr 22, 1926, 85 y.o.   MRN: [id number]  Chief Complaint:  Knee Pain   HPI: [name] is a 85 y.o. female presenting on 09/10/2018 for Knee Pain   Pt reports she fell about a week ago and injured her right knee. Pt states she is still having some aching pains in her right knee. States the pain is mild, 3/10 and worse with weight bearing and certain movements. States she is able to walk but this increases the pain. No swelling, erythema, open wounds, fever, chills, numbness, or tingling.   Knee Pain   The incident occurred 5 to 7 days ago. The incident occurred at home. The injury mechanism was a fall. The pain is present in the right knee. The quality of the pain is described as aching. The pain is at a severity of 3/10. The pain is mild.  The pain has been fluctuating since onset. Pertinent negatives include no inability to bear weight, loss of motion, loss of sensation, muscle weakness, numbness or tingling. Nothing aggravates the symptoms.     Relevant past medical, surgical, family, and social history reviewed and updated as indicated.  Allergies and medications reviewed and updated.   Past Medical History:  Diagnosis Date  . Arthritis   . Cancer ([place])    breast  . Cataract   . Diabetes mellitus without complication ([place])   . Frequent UTI   . Hyperlipidemia   . Hypertension   . Neck pain   . Scoliosis   . Stroke [facility])     Past Surgical History:  Procedure Laterality Date  . APPENDECTOMY    . BREAST LUMPECTOMY Left   . CHOLECYSTECTOMY    . EYE SURGERY    . JOINT REPLACEMENT     bilat knee   . REPLACEMENT TOTAL KNEE BILATERAL Bilateral   . TONSILLECTOMY      Social History   Socioeconomic History  . Marital status: Widowed    Spouse name: Not on file  . Number of children: 2  . Years of education: 51  . Highest education level: Some college, no degree  Occupational History  . Occupation: Retired    Fish farm manager: Scientist, forensic    Comment: Network engineer  Social Needs  . Financial resource strain: Not very hard  . Food insecurity:    Worry: Never true    Inability: Never true  .  Transportation needs:    Medical: No    Non-medical: No  Tobacco Use  . Smoking status: Never Smoker  . Smokeless tobacco: Never Used  Substance and Sexual Activity  . Alcohol use: No    Alcohol/week: 0.0 standard drinks  . Drug use: No  . Sexual activity: Never  Lifestyle  . Physical activity:    Days per week: 0 days    Minutes per session: 0 min  . Stress: To some extent  Relationships  . Social connections:    Talks on phone: Three times a week    Gets together: Never    Attends religious service: Never    Active member of club or organization: No    Attends meetings of clubs or organizations: Never     Relationship status: Widowed  . Intimate partner violence:    Fear of current or ex partner: No    Emotionally abused: No    Physically abused: No    Forced sexual activity: No  Other Topics Concern  . Not on file  Social History Narrative   Lives alone in a one story home.  Husband passed away. She has two sons 2 sons but.  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  Education: specialty courses with [employer], high school education.    Outpatient Encounter Medications as of 09/10/2018  Medication Sig  . apixaban (ELIQUIS) 5 MG TABS tablet Take 1 tablet (5 mg total) by mouth 2 (two) times daily.  . Cholecalciferol (VITAMIN D3) 5000 units CAPS Take 2,000 Units by mouth daily.   . D-Mannose 500 MG CAPS Take 500 mg by mouth daily.  . diclofenac sodium (VOLTAREN) 1 % GEL Apply 2 g topically 4 (four) times daily.  Marland Kitchen diltiazem (DILTIAZEM CD) 240 MG 24 hr capsule Take 240 mg by mouth 2 (two) times daily.  . furosemide (LASIX) 40 MG tablet Take 40 mg by mouth daily.  Marland Kitchen gabapentin (NEURONTIN) 100 MG capsule Take 300 mg by mouth 2 (two) times daily.  Marland Kitchen glucose blood (ONE TOUCH ULTRA TEST) test strip USE TO check blood sugars twice daily  . hydrALAZINE (APRESOLINE) 25 MG tablet Take 25 mg twice a day  . Insulin Glargine (LANTUS SOLOSTAR) 100 UNIT/ML Solostar Pen INJECT 50 UNITS ONCE DAILY AT 10PM  . Insulin Pen Needle 31G X 8 MM MISC Use once daily with lantus solostar  . Magnesium 250 MG TABS Take 250 mg by mouth daily.  . metFORMIN (GLUCOPHAGE) 500 MG tablet Take 1 Tablet by mouth once daily with BREAKFAST  . nabumetone (RELAFEN) 500 MG tablet Take 1 tablet (500 mg total) by mouth 2 (two) times daily. For muscle and joint pain  . omeprazole (PRILOSEC) 20 MG capsule Take 1 Capsule by mouth 2 times a day BEFORE meals  . rOPINIRole (REQUIP) 0.25 MG tablet Take 1 tablet (0.25 mg total) by mouth 3 (three) times daily for 30 days. Take one tablet in the morning, one tablet at noon and two tablets at bedtime.   . SUPER B COMPLEX/C PO Take 1 tablet by mouth daily.  Marland Kitchen triamcinolone (KENALOG) 0.025 % ointment Apply 1 application topically 2 (two) times daily. (Patient taking differently: Apply 1 application topically as needed. )   No facility-administered encounter medications on file as of 09/10/2018.     Allergies  Allergen Reactions  . Diltiazem Hcl     Red itchy rash started a few hours after taking short acting 120mg  dilt tabs  . Ace Inhibitors Cough  Review of Systems  Constitutional: Negative for chills, fatigue and fever.  Respiratory: Negative for cough and shortness of breath.   Cardiovascular: Negative for chest pain, palpitations and leg swelling.  Musculoskeletal: Positive for arthralgias. Negative for joint swelling.  Skin: Negative for color change and wound.  Neurological: Negative for dizziness, tingling, weakness, light-headedness, numbness and headaches.  Psychiatric/Behavioral: Negative for confusion.  All other systems reviewed and are negative.        Observations/Objective: No vital signs or physical exam, this was a telephone or virtual health encounter.  Pt alert and oriented, answers all questions appropriately, and able to speak in full sentences.    Assessment and Plan: Lesley was seen today for knee pain.  Diagnoses and all orders for this visit:  Acute pain of right knee No imaging due to COVID-19. Symptomatic care discussed. Rest, elevation, and moist heat. Will trial topical NSAID. Report any new or worsening symptoms. Will reevaluate in 4 weeks or sooner if symptoms worsen.  -     diclofenac sodium (VOLTAREN) 1 % GEL; Apply 2 g topically 4 (four) times daily.     Follow Up Instructions: Return in about 4 weeks (around 10/08/2018), or if symptoms worsen or fail to improve, for knee pain.    I discussed the assessment and treatment plan with the patient. The patient was provided an opportunity to ask questions and all were answered. The patient agreed  with the plan and demonstrated an understanding of the instructions.   The patient was advised to call back or seek an in-person evaluation if the symptoms worsen or if the condition fails to improve as anticipated.  The above assessment and management plan was discussed with the patient. The patient verbalized understanding of and has agreed to the management plan. Patient is aware to call the clinic if symptoms persist or worsen. Patient is aware when to return to the clinic for a follow-up visit. Patient educated on when it is appropriate to go to the emergency department.    I provided 15 minutes of non-face-to-face time during this encounter. The call started at 0900. The call ended at 0915. The other time was used for coordination of care.    Monia Pouch, FNP-C [facility] [address] [place], [place] [address] [phone]

## 2018-09-20 ENCOUNTER — Ambulatory Visit (INDEPENDENT_AMBULATORY_CARE_PROVIDER_SITE_OTHER): Payer: PPO | Admitting: Licensed Clinical Social Worker

## 2018-09-20 DIAGNOSIS — M150 Primary generalized (osteo)arthritis: Secondary | ICD-10-CM

## 2018-09-20 DIAGNOSIS — Z794 Long term (current) use of insulin: Secondary | ICD-10-CM

## 2018-09-20 DIAGNOSIS — E1140 Type 2 diabetes mellitus with diabetic neuropathy, unspecified: Secondary | ICD-10-CM | POA: Diagnosis not present

## 2018-09-20 DIAGNOSIS — K219 Gastro-esophageal reflux disease without esophagitis: Secondary | ICD-10-CM

## 2018-09-20 DIAGNOSIS — Z604 Social exclusion and rejection: Secondary | ICD-10-CM

## 2018-09-20 DIAGNOSIS — J8410 Pulmonary fibrosis, unspecified: Secondary | ICD-10-CM

## 2018-09-20 DIAGNOSIS — Z853 Personal history of malignant neoplasm of breast: Secondary | ICD-10-CM

## 2018-09-20 DIAGNOSIS — I5033 Acute on chronic diastolic (congestive) heart failure: Secondary | ICD-10-CM | POA: Diagnosis not present

## 2018-09-20 DIAGNOSIS — I10 Essential (primary) hypertension: Secondary | ICD-10-CM

## 2018-09-20 DIAGNOSIS — M159 Polyosteoarthritis, unspecified: Secondary | ICD-10-CM

## 2018-09-20 DIAGNOSIS — E1159 Type 2 diabetes mellitus with other circulatory complications: Secondary | ICD-10-CM

## 2018-09-20 NOTE — Chronic Care Management (AMB) (Signed)
Care Management Note   [name] is a 85 y.o. year old female who is a primary care patient of Joan Gouty, FNP. The CM team was consulted for assistance with chronic disease management and care coordination.   I reached out to [name] by phone today.   Review of patient status, including review of consultants reports, relevant laboratory and other test results, and collaboration with appropriate care team members and the patient's provider was performed as part of comprehensive patient evaluation and provision of chronic care management services.   Social Determinants of Health: Risk for Stress; risk for occasional social isolation              Goals Addressed                           . "I wish I could get out of the house and go to the grocery store" (pt-stated)        Current Barriers:   Lacks caregiver support.    Social isolation due to [name] Stay at [name] Orders  Nurse Case Manager Clinical Goal(s):   Over the next 30 days, patient will work with CM clinical social worker to address psychosocial issues related to social isolation.   Interventions:   Advised patient to talk with friends/family on the phone often for social/emotional support.   Suggested that patient make a quick trip to the grocery store once or twice a week.  ? She has an N95 mask. RN advised her to wear it. ? Only go in the store if it is not crowded ? Limit distance from other people to 6 feet or more ? Wash hands often and thoroughly ? Talked with client about calling in grocery order to local [name] store and then just driving to store to pay for ordered food and to pick it up  Encouraged client to communicate with RNCM related to client needs.   Patient encouraged to reach out with any medical or psychosocial needs    [name] provided counseling support for client     Talked with client about social support network of client (son, friends)  Patient  Self Care Activities:   Self administers medications as prescribed  Attends all scheduled provider appointments  Calls pharmacy for medication refills  Performs ADL's independently  Performs IADL's independently  Calls provider office for new concerns or questions  Plan:  Client to attend scheduled medical appointments Client to communicate with RN CM to discuss nursing needs of client [name] to call client in next 3 weeks to talk with her about social isolation issues and possibility of her going occasionally to local grocery store to obtain food supplies   Initial goal documentation     Client said she had her prescribed medications and is taking medications as prescribed. Client spoke of a fall 2 weeks ago.  Her knee is starting to feel a little better.  She said she is sleeping adequately.  She feels that her appetite is adequate. Client likes to read to relax.  Client watches TV as well to relax. Client makes phone calls to relatives as a way to socialize. Client said she has friends that help her obtain needed food items. Client said she will call [name] as needed to ask medical questions of concern. Client said she does well with ADLs. Client said she has been staying at home a good bit of time due to COVID 19  situation.   Follow Up Plan: [name] to call client in next 3 weeks to talk with client about social isolation issues and possibility of her going occasionally to local grocery store to obtain neeed food items.  [name].[name] MSW, [name] Licensed Clinical Social Worker [name] [phone]

## 2018-09-20 NOTE — Patient Instructions (Addendum)
Licensed Clinical Armed forces training and education officer Provided: No   I reached out to [employer] by phone today.   Social Determinants of Health: Risk for Stress; risk for occasional social isolation                                   Goals Addressed                           . "I wish I could get out of the house and go to the grocery store" (pt-stated)        Current Barriers:   Lacks caregiver support.   Social isolation due to [facility] Orders  Nurse Case Manager Clinical Goal(s):  Over the next 30 days, patient will work with CM clinical social worker to address psychosocial issues related to social isolation.   Interventions:   Advised patient to talk with friends/family on the phone often for social/emotional support.   Suggested that patient make a quick trip to the grocery store once or twice a week.  ? She has an N95 mask. RN advised her to wear it. ? Only go in the store if it is not crowded ? Limit distance from other people to 6 feet or more ? Wash hands often and thoroughly ? Talked with client about calling in grocery order to local [employer] store and then just driving to store to pay for ordered food and to pick it up  Encouraged client to [facility] RNCM related to client needs.   Patient encouraged to reach out with any medical or psychosocial needs   [name] provided counseling support for client   Talked with client about social support network of client (son, friends)  Patient Self Care Activities:   Self administers medications as prescribed  Attends all scheduled provider appointments  Calls pharmacy for medication refills  Performs ADL's independently  Performs IADL's independently  Calls provider office for new concerns or questions  Plan:  Client to attend scheduled medical appointments Client to communicate with RN CM to discuss nursing needs of  client [name] to call client in next 3 weeks to talk with her about social isolation issues and possibility of her going occasionally to local grocery store to obtain food supplies  Initial goal documentation     Client said she had her prescribed medications and is taking medications as prescribed. Client spoke of a fall 2 weeks ago.  Her knee is starting to feel a little better.  She said she is sleeping adequately.  She feels that her appetite is adequate. Client likes to read to relax.  Client watches TV as well to relax. Client makes phone calls to relatives as a way to socialize. Client said she has friends that help her obtain needed food items. Client said she will call [facility] as needed to ask medical questions of concern. Client said she does well with ADLs. Client said she has been staying at home a good bit of time due to COVID 19 situation.   Follow Up Plan:[name] to call client in next 3 weeks to talk with client about social isolation issues and possibility of her going occasionally to local grocery store to obtain neeedfood items.  The patient verbalized understanding of instructions provided today and declined a print copy of patient instruction materials.   Norva Riffle.Loris Seelye MSW,  [name] Licensed Clinical Social Worker [facility] [phone]

## 2018-10-01 ENCOUNTER — Other Ambulatory Visit: Payer: Self-pay | Admitting: Family Medicine

## 2018-10-02 ENCOUNTER — Other Ambulatory Visit: Payer: Self-pay | Admitting: Family Medicine

## 2018-10-02 DIAGNOSIS — R110 Nausea: Secondary | ICD-10-CM

## 2018-10-04 NOTE — Telephone Encounter (Signed)
Last office visit 09/10/2018

## 2018-10-07 ENCOUNTER — Telehealth: Payer: Self-pay | Admitting: Family Medicine

## 2018-10-08 ENCOUNTER — Ambulatory Visit (INDEPENDENT_AMBULATORY_CARE_PROVIDER_SITE_OTHER): Payer: PPO | Admitting: Licensed Clinical Social Worker

## 2018-10-08 ENCOUNTER — Encounter: Payer: Self-pay | Admitting: Family Medicine

## 2018-10-08 ENCOUNTER — Other Ambulatory Visit: Payer: Self-pay

## 2018-10-08 ENCOUNTER — Ambulatory Visit (INDEPENDENT_AMBULATORY_CARE_PROVIDER_SITE_OTHER): Payer: PPO | Admitting: Family Medicine

## 2018-10-08 DIAGNOSIS — E1159 Type 2 diabetes mellitus with other circulatory complications: Secondary | ICD-10-CM | POA: Diagnosis not present

## 2018-10-08 DIAGNOSIS — G8929 Other chronic pain: Secondary | ICD-10-CM | POA: Diagnosis not present

## 2018-10-08 DIAGNOSIS — I152 Hypertension secondary to endocrine disorders: Secondary | ICD-10-CM

## 2018-10-08 DIAGNOSIS — E1140 Type 2 diabetes mellitus with diabetic neuropathy, unspecified: Secondary | ICD-10-CM

## 2018-10-08 DIAGNOSIS — M25561 Pain in right knee: Secondary | ICD-10-CM | POA: Diagnosis not present

## 2018-10-08 DIAGNOSIS — K219 Gastro-esophageal reflux disease without esophagitis: Secondary | ICD-10-CM

## 2018-10-08 DIAGNOSIS — I10 Essential (primary) hypertension: Secondary | ICD-10-CM

## 2018-10-08 DIAGNOSIS — R110 Nausea: Secondary | ICD-10-CM | POA: Diagnosis not present

## 2018-10-08 DIAGNOSIS — Z853 Personal history of malignant neoplasm of breast: Secondary | ICD-10-CM | POA: Diagnosis not present

## 2018-10-08 DIAGNOSIS — J8410 Pulmonary fibrosis, unspecified: Secondary | ICD-10-CM

## 2018-10-08 DIAGNOSIS — Z794 Long term (current) use of insulin: Secondary | ICD-10-CM | POA: Diagnosis not present

## 2018-10-08 DIAGNOSIS — Z604 Social exclusion and rejection: Secondary | ICD-10-CM

## 2018-10-08 DIAGNOSIS — M150 Primary generalized (osteo)arthritis: Secondary | ICD-10-CM | POA: Diagnosis not present

## 2018-10-08 DIAGNOSIS — I5033 Acute on chronic diastolic (congestive) heart failure: Secondary | ICD-10-CM | POA: Diagnosis not present

## 2018-10-08 DIAGNOSIS — M159 Polyosteoarthritis, unspecified: Secondary | ICD-10-CM

## 2018-10-08 MED ORDER — ONDANSETRON HCL 4 MG PO TABS: ORAL_TABLET | ORAL | 0 refills | Status: DC

## 2018-10-08 NOTE — Patient Instructions (Addendum)
Licensed Clinical Social Worker Visit Information  Goals we discussed today:  Goals Addressed            This Visit's Progress   . "I would like to have some help with chores around my home" (pt-stated)       Current Barriers:  Marland Kitchen Knowledge Deficits related to resource options available to her . Lacks caregiver support. Son is available by phone but does not live locally.  . 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   Nurse Case Manager Clinical Goal(s):   Marland Kitchen Over the next 30 days, patient will communicate with [name] to discuss services of assistance that may help client in her home..   Interventions:   Offered information on [place] [phone]  Talked with client about functional challenges of client in the home  Patient Self Care Activities:  . Self administers medications as prescribed . Attends all scheduled provider appointments . Calls pharmacy for medication refills . Attends church or other social activities . Performs ADL's independently . Performs IADL's independently . Calls provider office for new concerns or questions     Plan:   Client to attend all scheduled provider appointments Client to communicate with RNCM as needed to discuss nursing needs of client [name] to call client in next 3 weeks to talk with client about in home care needs of client and functional challenges of client  Please see past updates related to this goal by clicking on the "Past Updates" button in the selected goal          Client said she had her prescribed medications and is taking medications as prescribed. Client spoke of a fall several weeks ago.  Her knee is starting to feel a little better.  She said she is sleeping adequately.  She feels that her appetite is adequate. Client likes to read to relax.  Client watches TV as well to relax. Client makes phone calls to relatives as a way to socialize. Client said  she has friends that help her obtain needed food items. Client said she will call [facility] as needed to ask medical questions of concern. Client said she does well with ADLs. Client said she has been staying at home a good bit of time due to COVID 19 situation. [name] has previously talked with client about in home support with [facility] agency. [name] also talked with client about in home support with [facility]. [name] gave client the phone number for [facility]. She said she will call [facility] to talk with [facility] representative about in home chore worker for client.  She said her knee is feeling better. She said she has appointment next week with Monia Pouch FNP.  Client said she plans to talk with [name] about client's current blood pressure readings.    Follow Up Plan: [name] to call client in next 3 weeks to talk with client about in home care needs of client and about functional challenges of client        Materials Provided: No  The patient verbalized understanding of instructions provided today and declined a print copy of patient instruction materials.   Norva Riffle.Xavi Tomasik MSW, [name] Licensed Clinical Social Worker [facility] [phone]

## 2018-10-08 NOTE — Chronic Care Management (AMB) (Signed)
Care Management Note   [name] is a 85 y.o. year old female who is a primary care patient of Joan Gouty, FNP. The CM team was consulted for assistance with chronic disease management and care coordination.   I reached out to [name] by phone today.   Review of patient status, including review of consultants reports, relevant laboratory and other test results, and collaboration with appropriate care team members and the patient's provider was performed as part of comprehensive patient evaluation and provision of chronic care management services.   Social Determinants of Health: Risk for Stress  Goals Addressed            This Visit's Progress   . "I would like to have some help with chores around my home" (pt-stated)       Current Barriers:  [name] Kitchen Knowledge Deficits related to resource options available to her . Lacks caregiver support. Son is available by phone but does not live locally.  . 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   Nurse Case Manager Clinical Goal(s):   [name] Kitchen Over the next 30 days, patient will communicate with [name] to discuss services of assistance that may help client in her home..   Interventions:   [name] offered information on [place] [phone]  [name] talked with client about functional challenges of client in the home  Patient Self Care Activities:  . Self administers medications as prescribed . Attends all scheduled provider appointments . Calls pharmacy for medication refills . Performs ADL's independently . Performs IADL's independently . Calls provider office for new concerns or questions   Plan:   Client to attend all scheduled provider appointments Client to communicate with RNCM as needed to discuss nursing needs of client [name] to call client in next 3 weeks to talk with client about in home care needs of client and functional challenges of client   Please see past updates related to this goal by clicking on the "Past Updates" button in the selected goal          Client said she had her prescribed medications and is taking medications as prescribed. Client spoke of a fall several weeks ago.  Her knee is starting to feel a little better.  She said she is sleeping adequately.  She feels that her appetite is adequate. Client likes to read to relax.  Client watches TV as well to relax. Client makes phone calls to relatives as a way to socialize. Client said she has friends that help her obtain needed food items. Client said she will call [facility] as needed to ask medical questions of concern. Client said she does well with ADLs. Client said she has been staying at home a good bit of time due to COVID 19 situation. RNCM [name] has previously talked with client about in home support with [facility] agency. [name] also talked with client about in home support with [facility]. [name] gave client the phone number for [employer]. She said she will call [facility] to talk with [facility] representative about in home chore worker for client.  She said her knee is feeling better. She said she has appointment next week with Joan Pouch FNP.  Client said she plans to talk with [name] about client's current blood pressure readings.    Follow Up Plan: [name] to call client in next 3 weeks to talk with client about in home care needs of client and  about functional challenges of client  [name].[name] MSW, [name] Licensed Clinical Social Worker [facility] [phone]

## 2018-10-08 NOTE — Progress Notes (Signed)
Virtual Visit via telephone Note Due to COVID-19, visit is conducted virtually and was requested by patient. This visit type was conducted due to national recommendations for restrictions regarding the COVID-19 Pandemic (e.g. social distancing) in an effort to limit this patient's exposure and mitigate transmission in our community. All issues noted in this document were discussed and addressed.  A physical exam was not performed with this format.   I connected with [name] on 10/08/18 at 0935 by telephone and verified that I am speaking with the correct person using two identifiers. [name] is currently located at home and no one is currently with them during visit. The provider, Monia Pouch, FNP is located in their office at time of visit.  I discussed the limitations, risks, security and privacy concerns of performing an evaluation and management service by telephone and the availability of in person appointments. I also discussed with the patient that there may be a patient responsible charge related to this service. The patient expressed understanding and agreed to proceed.  Subjective:  Patient ID: [name], female    DOB: 29-May-1925, 85 y.o.   MRN: [id number]  Chief Complaint:  Knee Pain   HPI: [name] is a 85 y.o. female presenting on 10/08/2018 for Knee Pain   Pt presents today for follow up of her right knee pain. Pt has chronic knee pain that was exacerbated by a fall on 09/10/2018. She has been using voltaren gel with great relief of the pain. She states the pain is minimal. She does report nausea. States this is intermittent. No vomiting, chest pain, abdominal pain, or shortness of breath. No diarrhea or constipation. No fever, chills, weakness, confusion, decreased urine output, or dizziness. States she is eating and drinking well.   Knee Pain   The incident occurred more than 1 week ago. The incident occurred at home. The injury mechanism was a  fall. The pain is present in the right knee. The quality of the pain is described as aching. The pain is at a severity of 1/10. The pain is mild. The pain has been improving since onset. Pertinent negatives include no inability to bear weight, loss of motion, loss of sensation, muscle weakness, numbness or tingling. She has tried NSAIDs for the symptoms. The treatment provided significant relief.     Relevant past medical, surgical, family, and social history reviewed and updated as indicated.  Allergies and medications reviewed and updated.   Past Medical History:  Diagnosis Date  . Arthritis   . Cancer ([place])    breast  . Cataract   . Diabetes mellitus without complication ([place])   . Frequent UTI   . Hyperlipidemia   . Hypertension   . Neck pain   . Scoliosis   . Stroke [facility])     Past Surgical History:  Procedure Laterality Date  . APPENDECTOMY    . BREAST LUMPECTOMY Left   . CHOLECYSTECTOMY    . EYE SURGERY    . JOINT REPLACEMENT     bilat knee   . REPLACEMENT TOTAL KNEE BILATERAL Bilateral   . TONSILLECTOMY      Social History   Socioeconomic History  . Marital status: Widowed    Spouse name: Not on file  . Number of children: 2  . Years of education: 27  . Highest education level: Some college, no degree  Occupational History  . Occupation: Retired    Fish farm manager: Scientist, forensic    Comment: Network engineer  Social Needs  . Financial resource strain: Not very hard  . Food insecurity:    Worry: Never true    Inability: Never true  . Transportation needs:    Medical: No    Non-medical: No  Tobacco Use  . Smoking status: Never Smoker  . Smokeless tobacco: Never Used  Substance and Sexual Activity  . Alcohol use: No    Alcohol/week: 0.0 standard drinks  . Drug use: No  . Sexual activity: Never  Lifestyle  . Physical activity:    Days per week: 0 days    Minutes per session: 0 min  . Stress: To some extent  Relationships  . Social connections:    Talks on  phone: Three times a week    Gets together: Never    Attends religious service: Never    Active member of club or organization: No    Attends meetings of clubs or organizations: Never    Relationship status: Widowed  . Intimate partner violence:    Fear of current or ex partner: No    Emotionally abused: No    Physically abused: No    Forced sexual activity: No  Other Topics Concern  . Not on file  Social History Narrative   Lives alone in a one story home.  Husband passed away. She has two sons 2 sons but.  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  Education: specialty courses with [employer], high school education.    Outpatient Encounter Medications as of 10/08/2018  Medication Sig  . apixaban (ELIQUIS) 5 MG TABS tablet Take 1 tablet (5 mg total) by mouth 2 (two) times daily.  . Cholecalciferol (VITAMIN D3) 5000 units CAPS Take 2,000 Units by mouth daily.   . D-Mannose 500 MG CAPS Take 500 mg by mouth daily.  . diclofenac sodium (VOLTAREN) 1 % GEL Apply 2 g topically 4 (four) times daily.  Marland Kitchen diltiazem (DILTIAZEM CD) 240 MG 24 hr capsule Take 240 mg by mouth 2 (two) times daily.  . furosemide (LASIX) 40 MG tablet Take 40 mg by mouth daily.  Marland Kitchen gabapentin (NEURONTIN) 100 MG capsule Take 300 mg by mouth 2 (two) times daily.  Marland Kitchen glucose blood (ONE TOUCH ULTRA TEST) test strip USE TO check blood sugars twice daily  . hydrALAZINE (APRESOLINE) 25 MG tablet Take 25 mg twice a day  . Insulin Glargine (LANTUS SOLOSTAR) 100 UNIT/ML Solostar Pen INJECT 50 UNITS ONCE DAILY AT 10PM  . Insulin Pen Needle 31G X 8 MM MISC Use once daily with lantus solostar  . Magnesium 250 MG TABS Take 250 mg by mouth daily.  . metFORMIN (GLUCOPHAGE) 500 MG tablet Take 1 Tablet by mouth once daily with BREAKFAST  . nabumetone (RELAFEN) 500 MG tablet Take 1 tablet (500 mg total) by mouth 2 (two) times daily. For muscle and joint pain  . omeprazole (PRILOSEC) 20 MG capsule Take 1 Capsule by mouth 2 times a day BEFORE meals   . ondansetron (ZOFRAN) 4 MG tablet TAKE 1 TABLET EVERY 8 HOURS AS NEEDED FOR NAUSEA & VOMITING  . rOPINIRole (REQUIP) 0.25 MG tablet Take 1 tablet (0.25 mg total) by mouth 3 (three) times daily for 30 days. Take one tablet in the morning, one tablet at noon and two tablets at bedtime.  . SUPER B COMPLEX/C PO Take 1 tablet by mouth daily.  Marland Kitchen triamcinolone (KENALOG) 0.025 % ointment Apply 1 application topically 2 (two) times daily. (Patient taking differently: Apply 1 application topically as needed. )  . [  DISCONTINUED] ondansetron (ZOFRAN) 4 MG tablet TAKE 1 TABLET EVERY 8 HOURS AS NEEDED FOR NAUSEA & VOMITING   No facility-administered encounter medications on file as of 10/08/2018.     Allergies  Allergen Reactions  . Diltiazem Hcl     Red itchy rash started a few hours after taking short acting 120mg  dilt tabs  . Ace Inhibitors Cough    Review of Systems  Constitutional: Negative for activity change, appetite change, chills, diaphoresis, fatigue, fever and unexpected weight change.  Respiratory: Negative for cough, chest tightness, shortness of breath and wheezing.   Cardiovascular: Negative for chest pain, palpitations and leg swelling.  Gastrointestinal: Positive for nausea. Negative for abdominal distention, abdominal pain, anal bleeding, blood in stool, constipation, diarrhea, rectal pain and vomiting.  Genitourinary: Negative for decreased urine volume and difficulty urinating.  Musculoskeletal: Positive for arthralgias. Negative for myalgias.  Neurological: Negative for dizziness, tingling, syncope, weakness, light-headedness, numbness and headaches.  Psychiatric/Behavioral: Negative for confusion.  All other systems reviewed and are negative.        Observations/Objective: No vital signs or physical exam, this was a telephone or virtual health encounter.  Pt alert and oriented, answers all questions appropriately, and able to speak in full sentences.    Assessment and  Plan: Tomeko was seen today for knee pain.  Diagnoses and all orders for this visit:  Chronic pain of right knee Much improvement in pain. Continue symptomatic care. Report any new or worsening symptoms.   Nausea Bland diet. Force fluids. Small frequent sips to stay hydrated. Medications as prescribed and as needed. Report any new or worsening symptoms.  -     ondansetron (ZOFRAN) 4 MG tablet; TAKE 1 TABLET EVERY 8 HOURS AS NEEDED FOR NAUSEA & VOMITING     Follow Up Instructions: Return if symptoms worsen or fail to improve.    I discussed the assessment and treatment plan with the patient. The patient was provided an opportunity to ask questions and all were answered. The patient agreed with the plan and demonstrated an understanding of the instructions.   The patient was advised to call back or seek an in-person evaluation if the symptoms worsen or if the condition fails to improve as anticipated.  The above assessment and management plan was discussed with the patient. The patient verbalized understanding of and has agreed to the management plan. Patient is aware to call the clinic if symptoms persist or worsen. Patient is aware when to return to the clinic for a follow-up visit. Patient educated on when it is appropriate to go to the emergency department.    I provided 15 minutes of non-face-to-face time during this encounter. The call started at 0935. The call ended at 0950. The other time was used for coordination of care.    Monia Pouch, FNP-C [facility] [address] [place], [place] [address] [phone]

## 2018-10-10 DIAGNOSIS — M79674 Pain in right toe(s): Secondary | ICD-10-CM | POA: Diagnosis not present

## 2018-10-10 DIAGNOSIS — L03031 Cellulitis of right toe: Secondary | ICD-10-CM | POA: Diagnosis not present

## 2018-10-12 ENCOUNTER — Telehealth: Payer: PPO | Admitting: *Deleted

## 2018-10-12 ENCOUNTER — Telehealth: Payer: Self-pay | Admitting: Family Medicine

## 2018-10-12 NOTE — Telephone Encounter (Signed)
Spoke with pt regarding appt Pt will come in on Thurs

## 2018-10-14 ENCOUNTER — Ambulatory Visit (INDEPENDENT_AMBULATORY_CARE_PROVIDER_SITE_OTHER): Payer: PPO | Admitting: Family Medicine

## 2018-10-14 ENCOUNTER — Other Ambulatory Visit: Payer: Self-pay

## 2018-10-14 ENCOUNTER — Encounter: Payer: Self-pay | Admitting: Family Medicine

## 2018-10-14 VITALS — BP 183/83 | HR 70 | Temp 97.8°F | Ht <= 58 in | Wt 152.0 lb

## 2018-10-14 DIAGNOSIS — Z794 Long term (current) use of insulin: Secondary | ICD-10-CM | POA: Diagnosis not present

## 2018-10-14 DIAGNOSIS — I152 Hypertension secondary to endocrine disorders: Secondary | ICD-10-CM

## 2018-10-14 DIAGNOSIS — I10 Essential (primary) hypertension: Secondary | ICD-10-CM | POA: Diagnosis not present

## 2018-10-14 DIAGNOSIS — E1140 Type 2 diabetes mellitus with diabetic neuropathy, unspecified: Secondary | ICD-10-CM

## 2018-10-14 DIAGNOSIS — R252 Cramp and spasm: Secondary | ICD-10-CM

## 2018-10-14 DIAGNOSIS — E1159 Type 2 diabetes mellitus with other circulatory complications: Secondary | ICD-10-CM | POA: Diagnosis not present

## 2018-10-14 DIAGNOSIS — E1169 Type 2 diabetes mellitus with other specified complication: Secondary | ICD-10-CM | POA: Diagnosis not present

## 2018-10-14 DIAGNOSIS — E785 Hyperlipidemia, unspecified: Secondary | ICD-10-CM | POA: Diagnosis not present

## 2018-10-14 DIAGNOSIS — I4891 Unspecified atrial fibrillation: Secondary | ICD-10-CM

## 2018-10-14 LAB — BAYER DCA HB A1C WAIVED: HB A1C (BAYER DCA - WAIVED): 6.2 %

## 2018-10-14 MED ORDER — HYDRALAZINE HCL 50 MG PO TABS: 50.0000 mg | ORAL_TABLET | Freq: Two times a day (BID) | ORAL | 6 refills | Status: DC

## 2018-10-14 MED ORDER — ROPINIROLE HCL 0.25 MG PO TABS: 0.2500 mg | ORAL_TABLET | Freq: Three times a day (TID) | ORAL | 3 refills | Status: DC

## 2018-10-14 NOTE — Progress Notes (Addendum)
Subjective:  Patient ID: [name], female    DOB: [birth year], 85 y.o.   MRN: [id number]  Chief Complaint:  Hypertension   HPI: [name] is a 85 y.o. female presenting on 10/14/2018 for Hypertension   1. Hypertension associated with diabetes ([place])  Complaint with meds - Yes Checking BP at home ranging 180/90 Exercising Regularly - No Watching Salt intake - Yes Pertinent ROS:  Headache - No Chest pain - No Dyspnea - No Palpitations - No LE edema - No They report good compliance with medications and can restate their regimen by memory. No medication side effects.  BP Readings from Last 3 Encounters:  10/14/18 (!) 183/83  05/20/18 (!) 171/86  05/13/18 (!) 163/77     2. Type 2 diabetes mellitus with diabetic neuropathy, with long-term current use of insulin (HCC)  Diabetes mellitus 2 Compliant with meds - Yes Checking CBGs? No Exercising regularly? - No Watching carbohydrate intake? - Yes Neuropathy ? - Yes Hypoglycemic events - No Pertinent ROS:  Polyuria - No Polydipsia - No Vision problems - No   3. Hyperlipidemia associated with type 2 diabetes mellitus ([place])  Does try to watch her diet. Does not exercise on a regular basis.    4. New onset atrial fibrillation ([place])  Doing well. No palpitations. States she does feel like her heart flutters at times. No chest pain, shortness of breath, or leg swelling. No dizziness or syncope.    5. Leg cramps  Ongoing for several months. Worse at night. Requip does help. Does try to stay hydrated.      Relevant past medical, surgical, family, and social history reviewed and updated as indicated.  Allergies and medications reviewed and updated.   Past Medical History:  Diagnosis Date  . Arthritis   . Cancer ([place])    breast  . Cataract   . Diabetes mellitus without complication ([place])   . Frequent UTI   . Hyperlipidemia   . Hypertension   . Neck pain   . Scoliosis   . Stroke [facility])     Past  Surgical History:  Procedure Laterality Date  . APPENDECTOMY    . BREAST LUMPECTOMY Left   . CHOLECYSTECTOMY    . EYE SURGERY    . JOINT REPLACEMENT     bilat knee   . REPLACEMENT TOTAL KNEE BILATERAL Bilateral   . TONSILLECTOMY      Social History   Socioeconomic History  . Marital status: Widowed    Spouse name: Not on file  . Number of children: 2  . Years of education: 67  . Highest education level: Some college, no degree  Occupational History  . Occupation: Retired    Fish farm manager: Scientist, forensic    Comment: Network engineer  Social Needs  . Financial resource strain: Not very hard  . Food insecurity    Worry: Never true    Inability: Never true  . Transportation needs    Medical: No    Non-medical: No  Tobacco Use  . Smoking status: Never Smoker  . Smokeless tobacco: Never Used  Substance and Sexual Activity  . Alcohol use: No    Alcohol/week: 0.0 standard drinks  . Drug use: No  . Sexual activity: Never  Lifestyle  . Physical activity    Days per week: 0 days    Minutes per session: 0 min  . Stress: To some extent  Relationships  . Social Herbalist on phone: Three times  a week    Gets together: Never    Attends religious service: Never    Active member of club or organization: No    Attends meetings of clubs or organizations: Never    Relationship status: Widowed  . Intimate partner violence    Fear of current or ex partner: No    Emotionally abused: No    Physically abused: No    Forced sexual activity: No  Other Topics Concern  . Not on file  Social History Narrative   Lives alone in a one story home.  Husband passed away. She has two sons 2 sons but.  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  Education: specialty courses with [employer], high school education.    Outpatient Encounter Medications as of 10/14/2018  Medication Sig  . apixaban (ELIQUIS) 5 MG TABS tablet Take 1 tablet (5 mg total) by mouth 2 (two) times daily.  . Cholecalciferol  (VITAMIN D3) 5000 units CAPS Take 2,000 Units by mouth daily.   . D-Mannose 500 MG CAPS Take 500 mg by mouth daily.  . diclofenac sodium (VOLTAREN) 1 % GEL Apply 2 g topically 4 (four) times daily.  . furosemide (LASIX) 40 MG tablet Take 40 mg by mouth daily.  Marland Kitchen gabapentin (NEURONTIN) 100 MG capsule Take 300 mg by mouth 2 (two) times daily.  Marland Kitchen glucose blood (ONE TOUCH ULTRA TEST) test strip USE TO check blood sugars twice daily  . Insulin Glargine (LANTUS SOLOSTAR) 100 UNIT/ML Solostar Pen INJECT 50 UNITS ONCE DAILY AT 10PM  . Insulin Pen Needle 31G X 8 MM MISC Use once daily with lantus solostar  . Magnesium 250 MG TABS Take 250 mg by mouth daily.  . metFORMIN (GLUCOPHAGE) 500 MG tablet Take 1 Tablet by mouth once daily with BREAKFAST  . nabumetone (RELAFEN) 500 MG tablet Take 1 tablet (500 mg total) by mouth 2 (two) times daily. For muscle and joint pain  . omeprazole (PRILOSEC) 20 MG capsule Take 1 Capsule by mouth 2 times a day BEFORE meals  . ondansetron (ZOFRAN) 4 MG tablet TAKE 1 TABLET EVERY 8 HOURS AS NEEDED FOR NAUSEA & VOMITING  . SUPER B COMPLEX/C PO Take 1 tablet by mouth daily.  Marland Kitchen triamcinolone (KENALOG) 0.025 % ointment Apply 1 application topically 2 (two) times daily. (Patient taking differently: Apply 1 application topically as needed. )  . [DISCONTINUED] diltiazem (DILTIAZEM CD) 240 MG 24 hr capsule Take 240 mg by mouth 2 (two) times daily.  . [DISCONTINUED] hydrALAZINE (APRESOLINE) 25 MG tablet Take 25 mg twice a day  . hydrALAZINE (APRESOLINE) 50 MG tablet Take 1 tablet (50 mg total) by mouth 2 (two) times daily for 30 days.  Marland Kitchen rOPINIRole (REQUIP) 0.25 MG tablet Take 1 tablet (0.25 mg total) by mouth 3 (three) times daily for 30 days. Take one tablet in the morning, one tablet at noon and two tablets at bedtime.  . [DISCONTINUED] rOPINIRole (REQUIP) 0.25 MG tablet Take 1 tablet (0.25 mg total) by mouth 3 (three) times daily for 30 days. Take one tablet in the morning, one  tablet at noon and two tablets at bedtime.  . [DISCONTINUED] rOPINIRole (REQUIP) 0.25 MG tablet Take 1 tablet (0.25 mg total) by mouth 3 (three) times daily for 30 days. Take one tablet in the morning, one tablet at noon and two tablets at bedtime.   No facility-administered encounter medications on file as of 10/14/2018.     Allergies  Allergen Reactions  . Diltiazem Hcl  Red itchy rash started a few hours after taking short acting '120mg'$  dilt tabs  . Ace Inhibitors Cough    Review of Systems ROS per HPI     Objective:  BP (!) 183/83   Pulse 70   Temp 97.8 F (36.6 C) (Oral)   Ht '4\' 10"'$  (1.473 m)   Wt 152 lb (68.9 kg)   BMI 31.77 kg/m    Wt Readings from Last 3 Encounters:  10/14/18 152 lb (68.9 kg)  05/20/18 152 lb (68.9 kg)  05/13/18 153 lb (69.4 kg)    Physical Exam Vitals signs and nursing note reviewed.  Constitutional:      General: She is not in acute distress.    Appearance: Normal appearance. She is well-developed and well-groomed. She is not ill-appearing, toxic-appearing or diaphoretic.  HENT:     Head: Normocephalic and atraumatic.     Jaw: There is normal jaw occlusion.     Right Ear: Hearing normal.     Left Ear: Hearing normal.     Nose: Nose normal.     Mouth/Throat:     Lips: Pink.     Mouth: Mucous membranes are moist.     Pharynx: Oropharynx is clear. Uvula midline.  Eyes:     General: Lids are normal.     Extraocular Movements: Extraocular movements intact.     Conjunctiva/sclera: Conjunctivae normal.     Pupils: Pupils are equal, round, and reactive to light.  Neck:     Musculoskeletal: Normal range of motion and neck supple.     Thyroid: No thyroid mass, thyromegaly or thyroid tenderness.     Vascular: No carotid bruit or JVD.     Trachea: Trachea and phonation normal.  Cardiovascular:     Rate and Rhythm: Normal rate. Rhythm regularly irregular.     Chest Wall: PMI is not displaced.     Pulses: Normal pulses.     Heart sounds:  Normal heart sounds. No murmur. No friction rub. No gallop.   Pulmonary:     Effort: Pulmonary effort is normal. No respiratory distress.     Breath sounds: Normal breath sounds. No wheezing.  Abdominal:     General: Bowel sounds are normal. There is no distension or abdominal bruit.     Palpations: Abdomen is soft. There is no hepatomegaly or splenomegaly.     Tenderness: There is no abdominal tenderness. There is no right CVA tenderness or left CVA tenderness.     Hernia: No hernia is present.  Musculoskeletal: Normal range of motion.     Right lower leg: No edema.     Left lower leg: No edema.  Lymphadenopathy:     Cervical: No cervical adenopathy.  Skin:    General: Skin is warm and dry.     Capillary Refill: Capillary refill takes less than 2 seconds.     Coloration: Skin is not cyanotic, jaundiced or pale.     Findings: No rash.  Neurological:     General: No focal deficit present.     Mental Status: She is alert and oriented to person, place, and time.     Cranial Nerves: Cranial nerves are intact.     Sensory: Sensation is intact.     Motor: Motor function is intact.     Coordination: Coordination is intact.     Gait: Gait is intact.     Deep Tendon Reflexes: Reflexes are normal and symmetric.  Psychiatric:        Attention and  Perception: Attention and perception normal.        Mood and Affect: Mood and affect normal.        Speech: Speech normal.        Behavior: Behavior normal. Behavior is cooperative.        Thought Content: Thought content normal.        Cognition and Memory: Cognition and memory normal.        Judgment: Judgment normal.     Results for orders placed or performed during the hospital encounter of 05/13/18  Urine culture   Specimen: Urine, Clean Catch  Result Value Ref Range   Specimen Description      URINE, CLEAN CATCH Performed at [facility], [address] [place], [place] [address]    Special Requests      NONE Performed at [facility], [address], [place], [place] [address]    Culture 40,000 COLONIES/mL ENTEROCOCCUS FAECALIS (A)    Report Status 05/16/2018 FINAL    Organism ID, Bacteria ENTEROCOCCUS FAECALIS (A)       Susceptibility   Enterococcus faecalis - MIC*    AMPICILLIN <=2 SENSITIVE Sensitive     LEVOFLOXACIN >=8 RESISTANT Resistant     NITROFURANTOIN <=16 SENSITIVE Sensitive     VANCOMYCIN <=0.5 SENSITIVE Sensitive     * 40,000 COLONIES/mL ENTEROCOCCUS FAECALIS  Basic metabolic panel  Result Value Ref Range   Sodium 136 135 - 145 mmol/L   Potassium 3.7 3.5 - 5.1 mmol/L   Chloride 99 98 - 111 mmol/L   CO2 29 22 - 32 mmol/L   Glucose, Bld 145 (H) 70 - 99 mg/dL   BUN 27 (H) 8 - 23 mg/dL   Creatinine, Ser 1.04 (H) 0.44 - 1.00 mg/dL   Calcium 9.1 8.9 - 10.3 mg/dL   GFR calc non Af Amer 47 (L) >60 mL/min   GFR calc Af Amer 55 (L) >60 mL/min   Anion gap 8 5 - 15  CBC with Differential  Result Value Ref Range   WBC 7.4 4.0 - 10.5 K/uL   RBC 5.21 (H) 3.87 - 5.11 MIL/uL   Hemoglobin 12.3 12.0 - 15.0 g/dL   HCT 41.7 36.0 - 46.0 %   MCV 80.0 80.0 - 100.0 fL   MCH 23.6 (L) 26.0 - 34.0 pg   MCHC 29.5 (L) 30.0 - 36.0 g/dL   RDW 17.6 (H) 11.5 - 15.5 %   Platelets 227 150 - 400 K/uL   nRBC 0.0 0.0 - 0.2 %   Neutrophils Relative % 65 %   Neutro Abs 4.7 1.7 - 7.7 K/uL   Lymphocytes Relative 17 %   Lymphs Abs 1.3 0.7 - 4.0 K/uL   Monocytes Relative 14 %   Monocytes Absolute 1.0 0.1 - 1.0 K/uL   Eosinophils Relative 4 %   Eosinophils Absolute 0.3 0.0 - 0.5 K/uL   Basophils Relative 0 %   Basophils Absolute 0.0 0.0 - 0.1 K/uL   Immature Granulocytes 0 %   Abs Immature Granulocytes 0.02 0.00 - 0.07 K/uL  Urinalysis, Routine w reflex microscopic  Result Value Ref Range   Color, Urine AMBER (A) YELLOW   APPearance CLOUDY (A) CLEAR   Specific Gravity, Urine 1.012 1.005 - 1.030   pH 5.0 5.0 - 8.0   Glucose, UA NEGATIVE NEGATIVE mg/dL   Hgb urine dipstick NEGATIVE NEGATIVE   Bilirubin Urine NEGATIVE  NEGATIVE   Ketones, ur NEGATIVE NEGATIVE mg/dL   Protein, ur NEGATIVE NEGATIVE mg/dL  Nitrite NEGATIVE NEGATIVE   Leukocytes, UA MODERATE (A) NEGATIVE   RBC / HPF 0-5 0 - 5 RBC/hpf   WBC, UA 11-20 0 - 5 WBC/hpf   Bacteria, UA RARE (A) NONE SEEN   Squamous Epithelial / LPF 6-10 0 - 5   Mucus PRESENT    Hyaline Casts, UA PRESENT        Pertinent labs & imaging results that were available during my care of the patient were reviewed by me and considered in my medical decision making.  Assessment & Plan:  Rindy was seen today for hypertension.  Diagnoses and all orders for this visit:  Hypertension associated with diabetes ([place]) Due to continued elevated BP will increase apresoline to 50 mg twice a day. Continue to monitor at home and report any persistent highs or lows. Follow up in 1 week for recheck.  -     hydrALAZINE (APRESOLINE) 50 MG tablet; Take 1 tablet (50 mg total) by mouth 2 (two) times daily for 30 days. -     CMP14+EGFR -     TSH -     Microalbumin / creatinine urine ratio  Type 2 diabetes mellitus with diabetic neuropathy, with long-term current use of insulin (HCC) A1C 6.2 today Stable. Labs pending. Continue below.  -     CMP14+EGFR -     Microalbumin / creatinine urine ratio -     Bayer DCA Hb A1c Waived -     rOPINIRole (REQUIP) 0.25 MG tablet; Take 1 tablet (0.25 mg total) by mouth 3 (three) times daily for 30 days. Take one tablet in the morning, one tablet at noon and two tablets at bedtime.  Hyperlipidemia associated with type 2 diabetes mellitus ([place]) -     Lipid panel  New onset atrial fibrillation (HCC) Continue Eliquis. Labs pending. Report any new or worsening symptoms.  -     CBC with Differential/Platelet  Leg cramps Continue Requip. Increase water intake. Report any new or worsening symptoms.  -     rOPINIRole (REQUIP) 0.25 MG tablet; Take 1 tablet (0.25 mg total) by mouth 3 (three) times daily for 30 days. Take one tablet in the morning, one  tablet at noon and two tablets at bedtime.     Continue all other maintenance medications.  Follow up plan: Return in about 1 week (around 10/21/2018), or if symptoms worsen or fail to improve, for HTN.  Educational handout given for DASH diet  The above assessment and management plan was discussed with the patient. The patient verbalized understanding of and has agreed to the management plan. Patient is aware to call the clinic if symptoms persist or worsen. Patient is aware when to return to the clinic for a follow-up visit. Patient educated on when it is appropriate to go to the emergency department.   Monia Pouch, FNP-C [facility] [phone]

## 2018-10-14 NOTE — Patient Instructions (Signed)
DASH Eating Plan  DASH stands for "Dietary Approaches to Stop Hypertension." The DASH eating plan is a healthy eating plan that has been shown to reduce high blood pressure (hypertension). It may also reduce your risk for type 2 diabetes, heart disease, and stroke. The DASH eating plan may also help with weight loss.  What are tips for following this plan?    General guidelines   Avoid eating more than 2,300 mg (milligrams) of salt (sodium) a day. If you have hypertension, you may need to reduce your sodium intake to 1,500 mg a day.   Limit alcohol intake to no more than 1 drink a day for nonpregnant women and 2 drinks a day for men. One drink equals 12 oz of beer, 5 oz of wine, or 1 oz of hard liquor.   Work with your health care provider to maintain a healthy body weight or to lose weight. Ask what an ideal weight is for you.   Get at least 30 minutes of exercise that causes your heart to beat faster (aerobic exercise) most days of the week. Activities may include walking, swimming, or biking.   Work with your health care provider or diet and nutrition specialist (dietitian) to adjust your eating plan to your individual calorie needs.  Reading food labels     Check food labels for the amount of sodium per serving. Choose foods with less than 5 percent of the Daily Value of sodium. Generally, foods with less than 300 mg of sodium per serving fit into this eating plan.   To find whole grains, look for the word "whole" as the first word in the ingredient list.  Shopping   Buy products labeled as "low-sodium" or "no salt added."   Buy fresh foods. Avoid canned foods and premade or frozen meals.  Cooking   Avoid adding salt when cooking. Use salt-free seasonings or herbs instead of table salt or sea salt. Check with your health care provider or pharmacist before using salt substitutes.   Do not fry foods. Cook foods using healthy methods such as baking, boiling, grilling, and broiling instead.   Cook with  heart-healthy oils, such as olive, canola, soybean, or sunflower oil.  Meal planning   Eat a balanced diet that includes:  ? 5 or more servings of fruits and vegetables each day. At each meal, try to fill half of your plate with fruits and vegetables.  ? Up to 6-8 servings of whole grains each day.  ? Less than 6 oz of lean meat, poultry, or fish each day. A 3-oz serving of meat is about the same size as a deck of cards. One egg equals 1 oz.  ? 2 servings of low-fat dairy each day.  ? A serving of nuts, seeds, or beans 5 times each week.  ? Heart-healthy fats. Healthy fats called Omega-3 fatty acids are found in foods such as flaxseeds and coldwater fish, like sardines, salmon, and mackerel.   Limit how much you eat of the following:  ? Canned or prepackaged foods.  ? Food that is high in trans fat, such as fried foods.  ? Food that is high in saturated fat, such as fatty meat.  ? Sweets, desserts, sugary drinks, and other foods with added sugar.  ? Full-fat dairy products.   Do not salt foods before eating.   Try to eat at least 2 vegetarian meals each week.   Eat more home-cooked food and less restaurant, buffet, and fast food.     When eating at a restaurant, ask that your food be prepared with less salt or no salt, if possible.  What foods are recommended?  The items listed may not be a complete list. Talk with your dietitian about what dietary choices are best for you.  Grains  Whole-grain or whole-wheat bread. Whole-grain or whole-wheat pasta. Brown rice. Oatmeal. Quinoa. Bulgur. Whole-grain and low-sodium cereals. Pita bread. Low-fat, low-sodium crackers. Whole-wheat flour tortillas.  Vegetables  Fresh or frozen vegetables (raw, steamed, roasted, or grilled). Low-sodium or reduced-sodium tomato and vegetable juice. Low-sodium or reduced-sodium tomato sauce and tomato paste. Low-sodium or reduced-sodium canned vegetables.  Fruits  All fresh, dried, or frozen fruit. Canned fruit in natural juice (without  added sugar).  Meat and other protein foods  Skinless chicken or turkey. Ground chicken or turkey. Pork with fat trimmed off. Fish and seafood. Egg whites. Dried beans, peas, or lentils. Unsalted nuts, nut butters, and seeds. Unsalted canned beans. Lean cuts of beef with fat trimmed off. Low-sodium, lean deli meat.  Dairy  Low-fat (1%) or fat-free (skim) milk. Fat-free, low-fat, or reduced-fat cheeses. Nonfat, low-sodium ricotta or cottage cheese. Low-fat or nonfat yogurt. Low-fat, low-sodium cheese.  Fats and oils  Soft margarine without trans fats. Vegetable oil. Low-fat, reduced-fat, or light mayonnaise and salad dressings (reduced-sodium). Canola, safflower, olive, soybean, and sunflower oils. Avocado.  Seasoning and other foods  Herbs. Spices. Seasoning mixes without salt. Unsalted popcorn and pretzels. Fat-free sweets.  What foods are not recommended?  The items listed may not be a complete list. Talk with your dietitian about what dietary choices are best for you.  Grains  Baked goods made with fat, such as croissants, muffins, or some breads. Dry pasta or rice meal packs.  Vegetables  Creamed or fried vegetables. Vegetables in a cheese sauce. Regular canned vegetables (not low-sodium or reduced-sodium). Regular canned tomato sauce and paste (not low-sodium or reduced-sodium). Regular tomato and vegetable juice (not low-sodium or reduced-sodium). Pickles. Olives.  Fruits  Canned fruit in a light or heavy syrup. Fried fruit. Fruit in cream or butter sauce.  Meat and other protein foods  Fatty cuts of meat. Ribs. Fried meat. Bacon. Sausage. Bologna and other processed lunch meats. Salami. Fatback. Hotdogs. Bratwurst. Salted nuts and seeds. Canned beans with added salt. Canned or smoked fish. Whole eggs or egg yolks. Chicken or turkey with skin.  Dairy  Whole or 2% milk, cream, and half-and-half. Whole or full-fat cream cheese. Whole-fat or sweetened yogurt. Full-fat cheese. Nondairy creamers. Whipped toppings.  Processed cheese and cheese spreads.  Fats and oils  Butter. Stick margarine. Lard. Shortening. Ghee. Bacon fat. Tropical oils, such as coconut, palm kernel, or palm oil.  Seasoning and other foods  Salted popcorn and pretzels. Onion salt, garlic salt, seasoned salt, table salt, and sea salt. Worcestershire sauce. Tartar sauce. Barbecue sauce. Teriyaki sauce. Soy sauce, including reduced-sodium. Steak sauce. Canned and packaged gravies. Fish sauce. Oyster sauce. Cocktail sauce. Horseradish that you find on the shelf. Ketchup. Mustard. Meat flavorings and tenderizers. Bouillon cubes. Hot sauce and Tabasco sauce. Premade or packaged marinades. Premade or packaged taco seasonings. Relishes. Regular salad dressings.  Where to find more information:   National Heart, Lung, and Blood Institute: www.nhlbi.nih.gov   American Heart Association: www.heart.org  Summary   The DASH eating plan is a healthy eating plan that has been shown to reduce high blood pressure (hypertension). It may also reduce your risk for type 2 diabetes, heart disease, and stroke.   With the   DASH eating plan, you should limit salt (sodium) intake to 2,300 mg a day. If you have hypertension, you may need to reduce your sodium intake to 1,500 mg a day.   When on the DASH eating plan, aim to eat more fresh fruits and vegetables, whole grains, lean proteins, low-fat dairy, and heart-healthy fats.   Work with your health care provider or diet and nutrition specialist (dietitian) to adjust your eating plan to your individual calorie needs.  This information is not intended to replace advice given to you by your health care provider. Make sure you discuss any questions you have with your health care provider.  Document Released: 04/10/2011 Document Revised: 04/14/2016 Document Reviewed: 04/14/2016  Elsevier Interactive Patient Education  2019 [employer]

## 2018-10-15 ENCOUNTER — Telehealth: Payer: Self-pay | Admitting: *Deleted

## 2018-10-15 LAB — CMP14+EGFR
ALT: 24 IU/L (ref 0–32)
AST: 21 IU/L (ref 0–40)
Albumin/Globulin Ratio: 1.8 (ref 1.2–2.2)
Albumin: 4.3 g/dL (ref 3.5–4.6)
Alkaline Phosphatase: 61 IU/L (ref 39–117)
BUN/Creatinine Ratio: 25 (ref 12–28)
BUN: 20 mg/dL (ref 10–36)
Bilirubin Total: 0.4 mg/dL (ref 0.0–1.2)
CO2: 23 mmol/L (ref 20–29)
Calcium: 9.4 mg/dL (ref 8.7–10.3)
Chloride: 102 mmol/L (ref 96–106)
Creatinine, Ser: 0.8 mg/dL (ref 0.57–1.00)
GFR calc Af Amer: 75 mL/min/{1.73_m2} (ref >59)
GFR calc non Af Amer: 65 mL/min/{1.73_m2} (ref >59)
Globulin, Total: 2.4 g/dL (ref 1.5–4.5)
Glucose: 85 mg/dL (ref 65–99)
Potassium: 4.6 mmol/L (ref 3.5–5.2)
Sodium: 145 mmol/L — ABNORMAL HIGH (ref 134–144)
Total Protein: 6.7 g/dL (ref 6.0–8.5)

## 2018-10-15 LAB — CBC WITH DIFFERENTIAL/PLATELET
Basophils Absolute: 0.1 10*3/uL (ref 0.0–0.2)
Basos: 1 %
EOS (ABSOLUTE): 0.3 10*3/uL (ref 0.0–0.4)
Eos: 4 %
Hematocrit: 45.3 % (ref 34.0–46.6)
Hemoglobin: 13.5 g/dL (ref 11.1–15.9)
Immature Grans (Abs): 0 10*3/uL (ref 0.0–0.1)
Immature Granulocytes: 0 %
Lymphocytes Absolute: 1.5 10*3/uL (ref 0.7–3.1)
Lymphs: 19 %
MCH: 24.7 pg — ABNORMAL LOW (ref 26.6–33.0)
MCHC: 29.8 g/dL — ABNORMAL LOW (ref 31.5–35.7)
MCV: 83 fL (ref 79–97)
Monocytes Absolute: 0.8 10*3/uL (ref 0.1–0.9)
Monocytes: 10 %
Neutrophils Absolute: 5.2 10*3/uL (ref 1.4–7.0)
Neutrophils: 66 %
Platelets: 251 10*3/uL (ref 150–450)
RBC: 5.47 x10E6/uL — ABNORMAL HIGH (ref 3.77–5.28)
RDW: 17.9 % — ABNORMAL HIGH (ref 11.7–15.4)
WBC: 8 10*3/uL (ref 3.4–10.8)

## 2018-10-15 LAB — LIPID PANEL
Chol/HDL Ratio: 5.8 ratio — ABNORMAL HIGH (ref 0.0–4.4)
Cholesterol, Total: 222 mg/dL — ABNORMAL HIGH (ref 100–199)
HDL: 38 mg/dL — ABNORMAL LOW (ref >39)
LDL Calculated: 142 mg/dL — ABNORMAL HIGH (ref 0–99)
Triglycerides: 209 mg/dL — ABNORMAL HIGH (ref 0–149)
VLDL Cholesterol Cal: 42 mg/dL — ABNORMAL HIGH (ref 5–40)

## 2018-10-15 LAB — TSH: TSH: 1.63 u[IU]/mL (ref 0.450–4.500)

## 2018-10-15 LAB — MICROALBUMIN / CREATININE URINE RATIO
Creatinine, Urine: 42.2 mg/dL
Microalb/Creat Ratio: 65 mg/g creat — ABNORMAL HIGH (ref 0–29)
Microalbumin, Urine: 27.4 ug/mL

## 2018-10-15 NOTE — Telephone Encounter (Signed)
Pt notified of results Verbalizes understanding 

## 2018-10-15 NOTE — Telephone Encounter (Signed)
-----   Message from Baruch Gouty, FNP sent at 10/15/2018 10:07 AM EDT ----- Your sodium is up a little. This could be contributing to your elevated blood pressure. Limit your salt intake, avoid salting foods when cooking or before eating. Glucose is normal. Liver and kidney functions are normal. Thyroid function is normal. CBC remains stable.  Your cholesterol, triglycerides, and LDL are all elevated. Limit your intake of fried, greasy, and fatty foods. Eat more fresh fruits, vegetables, and lean proteins. Grill or bake instead of frying foods. Use olive oil or coconut oil, avoid vegetable oil. We will recheck this in 3 months. I will recheck your sodium at your next appointment.

## 2018-10-20 ENCOUNTER — Other Ambulatory Visit: Payer: Self-pay

## 2018-10-21 ENCOUNTER — Ambulatory Visit (INDEPENDENT_AMBULATORY_CARE_PROVIDER_SITE_OTHER): Payer: PPO | Admitting: Family Medicine

## 2018-10-21 ENCOUNTER — Encounter: Payer: Self-pay | Admitting: Family Medicine

## 2018-10-21 VITALS — BP 179/85 | HR 75 | Temp 98.3°F | Ht <= 58 in | Wt 152.0 lb

## 2018-10-21 DIAGNOSIS — I10 Essential (primary) hypertension: Secondary | ICD-10-CM

## 2018-10-21 DIAGNOSIS — E1159 Type 2 diabetes mellitus with other circulatory complications: Secondary | ICD-10-CM | POA: Diagnosis not present

## 2018-10-21 DIAGNOSIS — I152 Hypertension secondary to endocrine disorders: Secondary | ICD-10-CM

## 2018-10-21 NOTE — Patient Instructions (Addendum)
Take 50 mg of hydralazine twice daily. We will recheck your blood pressure in 2 weeks.   In a few days you may receive a survey in the mail or online from [employer] regarding your visit with [place] today. Please take a moment to fill this out. Your feedback is very important to our office. It can help [place] better understand your needs as well as improve your experience and satisfaction. Thank you for taking your time to complete it. We care about you.  Monia Pouch, FNP-C  Hypertension Hypertension, commonly called high blood pressure, is when the force of blood pumping through the arteries is too strong. The arteries are the blood vessels that carry blood from the heart throughout the body. Hypertension forces the heart to work harder to pump blood and may cause arteries to become narrow or stiff. Having untreated or uncontrolled hypertension can cause heart attacks, strokes, kidney disease, and other problems. A blood pressure reading consists of a higher number over a lower number. Ideally, your blood pressure should be below 120/80. The first ("top") number is called the systolic pressure. It is a measure of the pressure in your arteries as your heart beats. The second ("bottom") number is called the diastolic pressure. It is a measure of the pressure in your arteries as the heart relaxes. What are the causes? The cause of this condition is not known. What increases the risk? Some risk factors for high blood pressure are under your control. Others are not. Factors you can change  Smoking.  Having type 2 diabetes mellitus, high cholesterol, or both.  Not getting enough exercise or physical activity.  Being overweight.  Having too much fat, sugar, calories, or salt (sodium) in your diet.  Drinking too much alcohol. Factors that are difficult or impossible to change  Having chronic kidney disease.  Having a family history of high blood pressure.  Age. Risk increases with age.  Race.  You may be at higher risk if you are African-American.  Gender. Men are at higher risk than women before age 83. After age 59, women are at higher risk than men.  Having obstructive sleep apnea.  Stress. What are the signs or symptoms? Extremely high blood pressure (hypertensive crisis) may cause:  Headache.  Anxiety.  Shortness of breath.  Nosebleed.  Nausea and vomiting.  Severe chest pain.  Jerky movements you cannot control (seizures). How is this diagnosed? This condition is diagnosed by measuring your blood pressure while you are seated, with your arm resting on a surface. The cuff of the blood pressure monitor will be placed directly against the skin of your upper arm at the level of your heart. It should be measured at least twice using the same arm. Certain conditions can cause a difference in blood pressure between your right and left arms. Certain factors can cause blood pressure readings to be lower or higher than normal (elevated) for a short period of time:  When your blood pressure is higher when you are in a health care provider's office than when you are at home, this is called white coat hypertension. Most people with this condition do not need medicines.  When your blood pressure is higher at home than when you are in a health care provider's office, this is called masked hypertension. Most people with this condition may need medicines to control blood pressure. If you have a high blood pressure reading during one visit or you have normal blood pressure with other risk factors:  You may be asked to return on a different day to have your blood pressure checked again.  You may be asked to monitor your blood pressure at home for 1 week or longer. If you are diagnosed with hypertension, you may have other blood or imaging tests to help your health care provider understand your overall risk for other conditions. How is this treated? This condition is treated by  making healthy lifestyle changes, such as eating healthy foods, exercising more, and reducing your alcohol intake. Your health care provider may prescribe medicine if lifestyle changes are not enough to get your blood pressure under control, and if:  Your systolic blood pressure is above 130.  Your diastolic blood pressure is above 80. Your personal target blood pressure may vary depending on your medical conditions, your age, and other factors. Follow these instructions at home: Eating and drinking   Eat a diet that is high in fiber and potassium, and low in sodium, added sugar, and fat. An example eating plan is called the DASH (Dietary Approaches to Stop Hypertension) diet. To eat this way: ? Eat plenty of fresh fruits and vegetables. Try to fill half of your plate at each meal with fruits and vegetables. ? Eat whole grains, such as whole wheat pasta, brown rice, or whole grain bread. Fill about one quarter of your plate with whole grains. ? Eat or drink low-fat dairy products, such as skim milk or low-fat yogurt. ? Avoid fatty cuts of meat, processed or cured meats, and poultry with skin. Fill about one quarter of your plate with lean proteins, such as fish, chicken without skin, beans, eggs, and tofu. ? Avoid premade and processed foods. These tend to be higher in sodium, added sugar, and fat.  Reduce your daily sodium intake. Most people with hypertension should eat less than 1,500 mg of sodium a day.  Limit alcohol intake to no more than 1 drink a day for nonpregnant women and 2 drinks a day for men. One drink equals 12 oz of beer, 5 oz of wine, or 1 oz of hard liquor. Lifestyle   Work with your health care provider to maintain a healthy body weight or to lose weight. Ask what an ideal weight is for you.  Get at least 30 minutes of exercise that causes your heart to beat faster (aerobic exercise) most days of the week. Activities may include walking, swimming, or biking.  Include  exercise to strengthen your muscles (resistance exercise), such as pilates or lifting weights, as part of your weekly exercise routine. Try to do these types of exercises for 30 minutes at least 3 days a week.  Do not use any products that contain nicotine or tobacco, such as cigarettes and e-cigarettes. If you need help quitting, ask your health care provider.  Monitor your blood pressure at home as told by your health care provider.  Keep all follow-up visits as told by your health care provider. This is important. Medicines  Take over-the-counter and prescription medicines only as told by your health care provider. Follow directions carefully. Blood pressure medicines must be taken as prescribed.  Do not skip doses of blood pressure medicine. Doing this puts you at risk for problems and can make the medicine less effective.  Ask your health care provider about side effects or reactions to medicines that you should watch for. Contact a health care provider if:  You think you are having a reaction to a medicine you are taking.  You have  headaches that keep coming back (recurring).  You feel dizzy.  You have swelling in your ankles.  You have trouble with your vision. Get help right away if:  You develop a severe headache or confusion.  You have unusual weakness or numbness.  You feel faint.  You have severe pain in your chest or abdomen.  You vomit repeatedly.  You have trouble breathing. Summary  Hypertension is when the force of blood pumping through your arteries is too strong. If this condition is not controlled, it may put you at risk for serious complications.  Your personal target blood pressure may vary depending on your medical conditions, your age, and other factors. For most people, a normal blood pressure is less than 120/80.  Hypertension is treated with lifestyle changes, medicines, or a combination of both. Lifestyle changes include weight loss, eating a  healthy, low-sodium diet, exercising more, and limiting alcohol. This information is not intended to replace advice given to you by your health care provider. Make sure you discuss any questions you have with your health care provider. Document Released: 04/21/2005 Document Revised: 03/19/2016 Document Reviewed: 03/19/2016 Elsevier Interactive Patient Education  2019 [employer].

## 2018-10-21 NOTE — Progress Notes (Signed)
    Subjective:    Patient here for follow-up of elevated blood pressure.  She is not exercising and is adherent to a low-salt diet.  Blood pressure is not well controlled at home. Cardiac symptoms: none. Patient denies: chest pain, claudication, dyspnea, fatigue, irregular heart beat, near-syncope, orthopnea, palpitations, paroxysmal nocturnal dyspnea, syncope and tachypnea. Cardiovascular risk factors: advanced age (older than 36 for men, 66 for women), diabetes mellitus, dyslipidemia, hypertension and sedentary lifestyle. Use of agents associated with hypertension: none. History of target organ damage: heart failure and stroke.  The following portions of the patient's history were reviewed and updated as appropriate: allergies, current medications, past family history, past medical history, past social history, past surgical history and problem list.  Review of Systems Constitutional: negative Eyes: negative Ears, nose, mouth, throat, and face: negative Respiratory: negative Cardiovascular: positive for lower extremity edema Neurological: negative     Objective:     Physical Exam  Constitutional: She is oriented to person, place, and time and well-developed, well-nourished, and in no distress. No distress.  HENT:  Head: Normocephalic and atraumatic.  Eyes: Pupils are equal, round, and reactive to light.  Neck: Neck supple. No JVD present. Carotid bruit is not present. No thyroid mass and no thyromegaly present.  Cardiovascular: Normal rate and normal heart sounds. A regularly irregular rhythm present. Exam reveals no gallop, no S3 and no friction rub.  No murmur heard. Pulmonary/Chest: Effort normal and breath sounds normal. No respiratory distress.  Neurological: She is alert and oriented to person, place, and time.  Skin: Skin is warm and dry. She is not diaphoretic.  Psychiatric: Mood, memory, affect and judgment normal.      Assessment:  Rowen was seen today for hypertension.   Diagnoses and all orders for this visit:  Hypertension associated with diabetes ([place]) Blood pressure elevated again today in office. Pt did not increase her hydralazine as discussed at last visit. Will increase dose to 50 mg twice daily and reevaluate in one week.     Plan:    Medication: dosage change hydralazine 50 mg twice daily. Dietary sodium restriction. Check blood pressures 4 times weekly and record. Follow up: 1 week and as needed.    The above assessment and management plan was discussed with the patient. The patient verbalized understanding of and has agreed to the management plan. Patient is aware to call the clinic if symptoms fail to improve or worsen. Patient is aware when to return to the clinic for a follow-up visit. Patient educated on when it is appropriate to go to the emergency department.   Monia Pouch, FNP-C [facility] [address] [place], [place] [address] [phone]

## 2018-10-23 ENCOUNTER — Other Ambulatory Visit: Payer: Self-pay | Admitting: Family Medicine

## 2018-10-26 ENCOUNTER — Ambulatory Visit: Payer: PPO | Admitting: Family Medicine

## 2018-10-26 DIAGNOSIS — M79674 Pain in right toe(s): Secondary | ICD-10-CM | POA: Diagnosis not present

## 2018-10-26 DIAGNOSIS — L03031 Cellulitis of right toe: Secondary | ICD-10-CM | POA: Diagnosis not present

## 2018-10-27 ENCOUNTER — Other Ambulatory Visit: Payer: Self-pay

## 2018-10-28 ENCOUNTER — Ambulatory Visit: Payer: Self-pay | Admitting: Licensed Clinical Social Worker

## 2018-10-28 ENCOUNTER — Encounter: Payer: Self-pay | Admitting: Family Medicine

## 2018-10-28 ENCOUNTER — Ambulatory Visit: Payer: PPO | Admitting: Family Medicine

## 2018-10-28 ENCOUNTER — Ambulatory Visit (INDEPENDENT_AMBULATORY_CARE_PROVIDER_SITE_OTHER): Payer: PPO | Admitting: Family Medicine

## 2018-10-28 DIAGNOSIS — E1140 Type 2 diabetes mellitus with diabetic neuropathy, unspecified: Secondary | ICD-10-CM

## 2018-10-28 DIAGNOSIS — K219 Gastro-esophageal reflux disease without esophagitis: Secondary | ICD-10-CM

## 2018-10-28 DIAGNOSIS — E1159 Type 2 diabetes mellitus with other circulatory complications: Secondary | ICD-10-CM

## 2018-10-28 DIAGNOSIS — Z853 Personal history of malignant neoplasm of breast: Secondary | ICD-10-CM

## 2018-10-28 DIAGNOSIS — Z604 Social exclusion and rejection: Secondary | ICD-10-CM

## 2018-10-28 DIAGNOSIS — Z794 Long term (current) use of insulin: Secondary | ICD-10-CM

## 2018-10-28 DIAGNOSIS — I10 Essential (primary) hypertension: Secondary | ICD-10-CM | POA: Diagnosis not present

## 2018-10-28 DIAGNOSIS — I5033 Acute on chronic diastolic (congestive) heart failure: Secondary | ICD-10-CM

## 2018-10-28 DIAGNOSIS — J8410 Pulmonary fibrosis, unspecified: Secondary | ICD-10-CM

## 2018-10-28 DIAGNOSIS — M159 Polyosteoarthritis, unspecified: Secondary | ICD-10-CM

## 2018-10-28 MED ORDER — FUROSEMIDE 40 MG PO TABS: 40.0000 mg | ORAL_TABLET | Freq: Every day | ORAL | 0 refills | Status: DC

## 2018-10-28 NOTE — Progress Notes (Signed)
Virtual Visit via telephone Note Due to COVID-19, visit is conducted virtually and was requested by patient. This visit type was conducted due to national recommendations for restrictions regarding the COVID-19 Pandemic (e.g. social distancing) in an effort to limit this patient's exposure and mitigate transmission in our community. All issues noted in this document were discussed and addressed.  A physical exam was not performed with this format.   I connected with [name] on 10/28/18 at 1500 by telephone and verified that I am speaking with the correct person using two identifiers. [name] is currently located at home and no one is currently with them during visit. The provider, Monia Pouch, FNP is located in their office at time of visit.  I discussed the limitations, risks, security and privacy concerns of performing an evaluation and management service by telephone and the availability of in person appointments. I also discussed with the patient that there may be a patient responsible charge related to this service. The patient expressed understanding and agreed to proceed.  Subjective:  Patient ID: [name], female    DOB: 1925-10-14, 85 y.o.   MRN: [id number]  Chief Complaint:  Hypertension   HPI: [name] is a 85 y.o. female presenting on 10/28/2018 for Hypertension   Pt reports her blood pressure has remained elevated despite increasing her hydralazine. She denies headaches, chest pain, leg swelling, palpitations, confusion, weakness, focal deficits, or syncope. No shortness of breath or visual changes. She states she has not been taking her lasix. States she has not taken this in several moths due to not needing it.     Relevant past medical, surgical, family, and social history reviewed and updated as indicated.  Allergies and medications reviewed and updated.   Past Medical History:  Diagnosis Date  . Arthritis   . Cancer ([place])    breast   . Cataract   . Diabetes mellitus without complication ([place])   . Frequent UTI   . Hyperlipidemia   . Hypertension   . Neck pain   . Scoliosis   . Stroke [facility])     Past Surgical History:  Procedure Laterality Date  . APPENDECTOMY    . BREAST LUMPECTOMY Left   . CHOLECYSTECTOMY    . EYE SURGERY    . JOINT REPLACEMENT     bilat knee   . REPLACEMENT TOTAL KNEE BILATERAL Bilateral   . TONSILLECTOMY      Social History   Socioeconomic History  . Marital status: Widowed    Spouse name: Not on file  . Number of children: 2  . Years of education: 73  . Highest education level: Some college, no degree  Occupational History  . Occupation: Retired    Fish farm manager: Scientist, forensic    Comment: Network engineer  Social Needs  . Financial resource strain: Not very hard  . Food insecurity    Worry: Never true    Inability: Never true  . Transportation needs    Medical: No    Non-medical: No  Tobacco Use  . Smoking status: Never Smoker  . Smokeless tobacco: Never Used  Substance and Sexual Activity  . Alcohol use: No    Alcohol/week: 0.0 standard drinks  . Drug use: No  . Sexual activity: Never  Lifestyle  . Physical activity    Days per week: 0 days    Minutes per session: 0 min  . Stress: To some extent  Relationships  . Social connections  Talks on phone: Three times a week    Gets together: Never    Attends religious service: Never    Active member of club or organization: No    Attends meetings of clubs or organizations: Never    Relationship status: Widowed  . Intimate partner violence    Fear of current or ex partner: No    Emotionally abused: No    Physically abused: No    Forced sexual activity: No  Other Topics Concern  . Not on file  Social History Narrative   Lives alone in a one story home.  Husband passed away. She has two sons 2 sons but.  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  Education: specialty courses with [employer], high school education.     Outpatient Encounter Medications as of 10/28/2018  Medication Sig  . apixaban (ELIQUIS) 5 MG TABS tablet Take 1 tablet (5 mg total) by mouth 2 (two) times daily.  . Cholecalciferol (VITAMIN D3) 5000 units CAPS Take 2,000 Units by mouth daily.   . D-Mannose 500 MG CAPS Take 500 mg by mouth daily.  . diclofenac sodium (VOLTAREN) 1 % GEL Apply 2 g topically 4 (four) times daily.  . furosemide (LASIX) 40 MG tablet Take 1 tablet (40 mg total) by mouth daily for 30 days.  Marland Kitchen gabapentin (NEURONTIN) 100 MG capsule Take 300 mg by mouth 2 (two) times daily.  Marland Kitchen glucose blood (ONE TOUCH ULTRA TEST) test strip USE TO check blood sugars twice daily  . hydrALAZINE (APRESOLINE) 50 MG tablet Take 1 tablet (50 mg total) by mouth 2 (two) times daily for 30 days.  . Insulin Glargine (LANTUS SOLOSTAR) 100 UNIT/ML Solostar Pen INJECT 50 UNITS ONCE DAILY AT 10PM  . Insulin Pen Needle 31G X 8 MM MISC Use once daily with lantus solostar  . Magnesium 250 MG TABS Take 250 mg by mouth daily.  . metFORMIN (GLUCOPHAGE) 500 MG tablet Take 1 Tablet by mouth once daily with BREAKFAST  . nabumetone (RELAFEN) 500 MG tablet Take 1 tablet (500 mg total) by mouth 2 (two) times daily. For muscle and joint pain  . omeprazole (PRILOSEC) 20 MG capsule TAKE  (1)  CAPSULE  TWICE DAILY (TAKE ON AN EMPTY STOMACH AT LEAST 30MIN- UTES BEFORE MEALS).  Marland Kitchen ondansetron (ZOFRAN) 4 MG tablet TAKE 1 TABLET EVERY 8 HOURS AS NEEDED FOR NAUSEA & VOMITING  . rOPINIRole (REQUIP) 0.25 MG tablet Take 1 tablet (0.25 mg total) by mouth 3 (three) times daily for 30 days. Take one tablet in the morning, one tablet at noon and two tablets at bedtime.  . SUPER B COMPLEX/C PO Take 1 tablet by mouth daily.  Marland Kitchen triamcinolone (KENALOG) 0.025 % ointment Apply 1 application topically 2 (two) times daily. (Patient taking differently: Apply 1 application topically as needed. )  . [DISCONTINUED] furosemide (LASIX) 40 MG tablet Take 40 mg by mouth daily.   No  facility-administered encounter medications on file as of 10/28/2018.     Allergies  Allergen Reactions  . Diltiazem Hcl     Red itchy rash started a few hours after taking short acting 120mg  dilt tabs  . Ace Inhibitors Cough    Review of Systems  Constitutional: Negative for chills, fatigue, fever and unexpected weight change.  Eyes: Negative for photophobia and visual disturbance.  Respiratory: Negative for cough, chest tightness and shortness of breath.   Cardiovascular: Negative for chest pain, palpitations and leg swelling.  Gastrointestinal: Negative for abdominal pain.  Genitourinary:  Negative for decreased urine volume and difficulty urinating.  Musculoskeletal: Positive for myalgias. Negative for gait problem.  Neurological: Negative for dizziness, tremors, seizures, syncope, facial asymmetry, speech difficulty, weakness, light-headedness, numbness and headaches.  Psychiatric/Behavioral: Negative for confusion.  All other systems reviewed and are negative.        Observations/Objective: No vital signs or physical exam, this was a telephone or virtual health encounter.  Pt alert and oriented, answers all questions appropriately, and able to speak in full sentences.    Assessment and Plan: Majesta was seen today for hypertension.  Diagnoses and all orders for this visit:  Hypertension associated with diabetes ([place]) Pt has increased hydralazine to 50 mg twice daily as discussed and is following a DASH diet. Blood pressure readings still elevated with highest being 180/92, lowest being 159/77. Pt has not been taking her lasix as prescribed. Pt will reinitiate this today and keep a log of her blood pressure over the next 2 weeks. Pt to follow up in 2 weeks for reevaluation and CMP. Pt aware to report any persistent highs or lows.  -     furosemide (LASIX) 40 MG tablet; Take 1 tablet (40 mg total) by mouth daily for 30 days.     Follow Up Instructions: Return if symptoms  worsen or fail to improve.    I discussed the assessment and treatment plan with the patient. The patient was provided an opportunity to ask questions and all were answered. The patient agreed with the plan and demonstrated an understanding of the instructions.   The patient was advised to call back or seek an in-person evaluation if the symptoms worsen or if the condition fails to improve as anticipated.  The above assessment and management plan was discussed with the patient. The patient verbalized understanding of and has agreed to the management plan. Patient is aware to call the clinic if symptoms persist or worsen. Patient is aware when to return to the clinic for a follow-up visit. Patient educated on when it is appropriate to go to the emergency department.    I provided 15 minutes of non-face-to-face time during this encounter. The call started at 1500. The call ended at 1515. The other time was used for coordination of care.    Monia Pouch, FNP-C [facility] [address] [place], [place] [address] [phone]

## 2018-10-28 NOTE — Patient Instructions (Addendum)
Licensed Clinical Social Worker Visit Information  Goals we discussed today:     . "I would like to have some help with chores around my home" (pt-stated)        Current Barriers:  Knowledge Deficits related to resource options available to her  Lacks caregiver support. Son is available by phone but does not live locally.  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  Nurse Case Manager Clinical Goal(s):  Over the next 30 days, patient will communicate with [name] to discuss services of assistance that may help client in her home..  Interventions:  [name] offered information on [place] [phone]  [name] talked with client about functional challenges of client in the home Patient Self Care Activities:  Self administers medications as prescribed  Attends all scheduled provider appointments  Calls pharmacy for medication refills  Performs ADL's independently  Performs IADL's independently  Calls provider office for new concerns or questions Plan:  Client to attend all scheduled provider appointments  Client to communicate with RNCM as needed to discuss nursing needs of client  [name] to call client in next 3 weeks to talk with client about in home care needs of client and functional challenges of client  Please see past updates related to this goal by clicking on the "Past Updates" button in the selected goal         Client said she had her prescribed medications and is taking medications as prescribed. Client spoke of her concern regarding her blood pressure. She said her blood pressure yesterday afternoon was 180/92. She said she had a leg cramp this morning. She said she has reduced sodium intake. She said she had no headache. She said " I am uneasy about blood pressure and don't want to have a stroke" Client said she has cardiologist, Dr. Neita Garnet, in [place], [place]. Client said also she had called [facility] for price cost  range for in home support. She said she checks her blood pressure 2 times daily. [name] informed [place] that this information would be forwarded to Monia Pouch, FN-P for her to review. Ramonia agreed to this plan  Materials Provided: No  Follow Up Plan: [name] to call client in next 3 weeks to discuss in home care needs of client and to discuss functional challenges of client  The patient verbalized understanding of instructions provided today and declined a print copy of patient instruction materials.   Norva Riffle.Laval Cafaro MSW, [name] Licensed Clinical Social Worker [facility] [phone]

## 2018-10-28 NOTE — Chronic Care Management (AMB) (Signed)
  Chronic Care Management    Clinical Social Work CCM Outreach Note  10/28/2018 Name: SAPHRONIA OZDEMIR MRN: [id number] DOB: [birth year]  [name] is a 85 y.o. year old female who is a primary care patient of Rakes, Connye Burkitt, FNP . The CCM team was consulted for assistance with assessment of psychosocial needs.   [name] reached out to [name] today by phone     Social Determinants of Health:Risk for Stress: Risk for occasional Social Isolation    Office Visit from 10/21/2018 in [place]  PHQ-9 Total Score  0    Goal Addressed                          . "I would like to have some help with chores around my home" (pt-stated)        Current Barriers:   Knowledge Deficits related to resource options available to her  Lacks caregiver support. Son is available by phone but does not live locally.   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   Nurse Case Manager Clinical Goal(s):    Over the next 30 days, patient will communicate with [name] to discuss services of assistance that may help client in her home..   Interventions:   [name] offered information on [place] [phone]  [name] talked with client about functional challenges of client in the home  Patient Self Care Activities:   Self administers medications as prescribed  Attends all scheduled provider appointments  Calls pharmacy for medication refills  Performs ADL's independently  Performs IADL's independently  Calls provider office for new concerns or questions   Plan:   Client to attend all scheduled provider appointments Client to communicate with RNCM as needed to discuss nursing needs of client [name] to call client in next 3 weeks to talk with client about in home care needs of client and functional challenges of client  Please see past updates related to this goal by clicking  on the "Past Updates" button in the selected goal          Client said she had her prescribed medications and is taking medications as prescribed. Client spoke of her concern regarding her blood pressure. She said her blood pressure yesterday afternoon was 180/92. She said she had a leg cramp this morning. She said she has reduced sodium intake. She said she had no headache. She said " I am uneasy about blood pressure and don't want to have a stroke" Client said she has cardiologist, Dr. Neita Garnet, in [place], [place]. Client said also she had called [employer] for price cost range for in home support. She said she checks her blood pressure 2 times daily.  [name] informed [place] that this information would be forwarded to Monia Pouch, FN-P for her to review. Marye agreed to this plan  Follow Up Plan: [name] to call client in next 3 weeks to talk with client about in home care needs of client and about functional challenges of client  Norva Riffle.Emer Onnen MSW, [name] Licensed Clinical Social Worker [facility] [phone]

## 2018-10-29 ENCOUNTER — Ambulatory Visit: Payer: PPO | Admitting: *Deleted

## 2018-10-29 DIAGNOSIS — M159 Polyosteoarthritis, unspecified: Secondary | ICD-10-CM

## 2018-10-29 DIAGNOSIS — E1159 Type 2 diabetes mellitus with other circulatory complications: Secondary | ICD-10-CM

## 2018-10-29 DIAGNOSIS — R5381 Other malaise: Secondary | ICD-10-CM

## 2018-10-29 NOTE — Chronic Care Management (AMB) (Signed)
Chronic Care Management   Telephone Follow Up Note   10/29/2018 Name: [name] MRN: [id number] DOB: 10/08/1925  Referred by: Baruch Gouty, FNP Reason for referral : Chronic Care Management (RNCM follow up)   [name] is a 85 y.o. year old female who is a primary care patient of Baruch Gouty, FNP. The CCM team was consulted for assistance with chronic disease management and care coordination needs. [name] spoke with Legrand Como "Scott" Forrest, [name] with the [facility] CCM Team regarding her psychosocial needs yesterday and relayed that she was concerned with her blood pressure elevation. She was scheduled for a telephone visit with her PCP Monia Pouch, [place] yesterday afternoon for a follow-up [name] advised her to restart her lasix and to monitor her blood pressure for the next week and report her readings next week.   I spoke with [name] by phone today. She states that she feels weak and washed out. Urinates 3 to 4 times a day and at least twice during the night. She restarted lasix yesterday per PCP recommendation but has not noticed any increase in urine output. New onset severe left leg cramps. Brought her to tears last week. She is taking magnesium 250mg  OTC and started potassium 90mg  OTC yesterday. She verified with PCP that this was ok to take. She states that the cramps resolved on their own and she hasn't had anymore since last week.    Goals Addressed            This Visit's Progress     Patient Stated   . "I don't want to fall" (pt-stated)       Current Barriers:  Marland Kitchen Knowledge Deficits related to in-home fall prevention measures . Lacks caregiver support.   Nurse Case Manager Clinical Goal(s):  Marland Kitchen Over the next 7 days, patient will verbalize understanding of plan for fall prevention . Over the next 30 days, patient will work with Consulting civil engineer to address needs related to fall prevention  Interventions:  . Advised patient to move carefully and  change positions slowly to avoid falls . Provided education to patient re: In-home fall prevention management o In particular she needs to remove her throw rugs and matts  . Fall assessment o One mechanical fall over the past month - Tripped over a matt o No injury but continues to have some left knee soreness o She was unable to get up on her own and had to call her son and his wife to help her up  Patient Self Care Activities:  . Performs ADLs independently   Initial goal documentation     . "I want to be able to get around better" (pt-stated)       Current Barriers:  Marland Kitchen Knowledge Deficits related to strength training exercises . Fear of injury or inability to physically perform exercises . Lacks caregiver support.   Nurse Case Manager Clinical Goal(s):  Marland Kitchen Over the next 7 days, patient will verbalize understanding of plan for strength training . Over the next 30 days, patient will work with Consulting civil engineer to address needs related to physical deconditioning . Over the next 60 days, patient will work with [facility] (community agency) to increase strength and balance.  Interventions:  . Provided education to patient re: physical deconditioning and how that impacts her overall quality of life and ability to properly care for herself independently.  . Discussed plans with patient for ongoing care management follow up and  provided patient with direct contact information for care management team . Discussed multiple joint pain/stiffness due to arthritis . Advised that PT and stretching may help with joint mobility as well as strengthen her muscles  Patient Self Care Activities:  . Self administers medications as prescribed . Calls pharmacy for medication refills . Calls provider office for new concerns or questions  Initial goal documentation     . I'm want to get my blood pressure down because I'm worried that I will have a stroke" (pt-stated)       Current  Barriers:  . Lacks caregiver support.  . Film/video editor.  . Transportation barriers . Chronic Disease Management support and education needs related to hypertension and stroke risk  Nurse Case Manager Clinical Goal(s):  Marland Kitchen Over the next 7 days, patient will verbalize understanding of plan for blood pressure management . Over the next 14 days, patient will work with Consulting civil engineer and PCP to address needs related to hypertension. . Over the next 5 days, patient will see an increase in urine output as a therapeutic response to restarting Lasix   Interventions:  . Evaluation of current treatment plan related to hypertension and patient's adherence to plan as established by provider. . Reviewed medications with patient and discussed PCP's instructions to restart Lasix and to increase hydralazine dose . Collaborated with Theadore Nan, [name] regarding patient's psychosocial needs in relation to [place] Stay in Place Orders . Discussed plans with patient for ongoing care management follow up and provided patient with direct contact information for care management team . Advised patient, providing education and rationale, to monitor blood pressure daily and record, calling [phone] for findings outside established parameters.  . Reviewed scheduled/upcoming provider appointments including: Follow up with PCP in 2 weeks . Discussed current symptoms:  o Patient states that she feels weak and washed out . Discussed home readings o Most recent was taken this morning: 180/90 . Reviewed previous in office BP results BP Readings from Last 3 Encounters:  10/21/18 (!) 179/85  10/14/18 (!) 183/83  05/20/18 (!) 171/86   Patient Self Care Activities:  . Self administers medications as prescribed . Calls pharmacy for medication refills . Calls provider office for new concerns or questions . Feels unable to leave her home safely on her own  Initial goal documentation       Assessment and  Plan I am concerned about her mental and physical health. Her current level of deconditioning is coming very close to affecting her ability to properly care for herself at home. She feels unsafe walking outside alone or driving to town alone to run an errand because of her physical deconditioning and weakness.  I worry that continuing to [place] due to [place] will cause further damage. I feel that she will benefit from in home physical therapy to assess and treat physical deconditioning, joint pain, and falls. Plan to discuss with PCP and order if appropriate.    The care management team will reach out to the patient again over the next 10 days.    The care management team and [facility] providers are available by telephone, [phone], as needed.    Chong Sicilian, RN-BC, BSN Nurse Care Manager [facility] [phone]

## 2018-10-29 NOTE — Patient Instructions (Signed)
Visit Information  Goals Addressed            This Visit's Progress     Patient Stated   . "I don't want to fall" (pt-stated)       Current Barriers:  Marland Kitchen Knowledge Deficits related to in-home fall prevention measures . Lacks caregiver support.   Nurse Case Manager Clinical Goal(s):  Marland Kitchen Over the next 7 days, patient will verbalize understanding of plan for fall prevention . Over the next 30 days, patient will work with Consulting civil engineer to address needs related to fall prevention  Interventions:  . Advised patient to move carefully and change positions slowly to avoid falls . Provided education to patient re: In-home fall prevention management o In particular she needs to remove her throw rugs and matts  . Fall assessment o One mechanical fall over the past month - Tripped over a matt o No injury but continues to have some left knee soreness o She was unable to get up on her own and had to call her son and his wife to help her up  Patient Self Care Activities:  . Performs ADLs independently   Initial goal documentation     . "I want to be able to get around better" (pt-stated)       Current Barriers:  Marland Kitchen Knowledge Deficits related to strength training exercises . Fear of injury or inability to physically perform exercises . Lacks caregiver support.   Nurse Case Manager Clinical Goal(s):  Marland Kitchen Over the next 7 days, patient will verbalize understanding of plan for strength training . Over the next 30 days, patient will work with Consulting civil engineer to address needs related to physical deconditioning . Over the next 60 days, patient will work with [facility] (community agency) to increase strength and balance.  Interventions:  . Provided education to patient re: physical deconditioning and how that impacts her overall quality of life and ability to properly care for herself independently.  . Discussed plans with patient for ongoing care management follow up and  provided patient with direct contact information for care management team . Discussed multiple joint pain/stiffness due to arthritis . Advised that PT and stretching may help with joint mobility as well as strengthen her muscles  Patient Self Care Activities:  . Self administers medications as prescribed . Calls pharmacy for medication refills . Calls provider office for new concerns or questions  Initial goal documentation     . I'm want to get my blood pressure down because I'm worried that I will have a stroke" (pt-stated)       Current Barriers:  . Lacks caregiver support.  . Film/video editor.  . Transportation barriers . Chronic Disease Management support and education needs related to hypertension and stroke risk  Nurse Case Manager Clinical Goal(s):  Marland Kitchen Over the next 7 days, patient will verbalize understanding of plan for blood pressure management . Over the next 14 days, patient will work with Consulting civil engineer and PCP to address needs related to hypertension. . Over the next 5 days, patient will see an increase in urine output as a therapeutic response to restarting Lasix   Interventions:  . Evaluation of current treatment plan related to hypertension and patient's adherence to plan as established by provider. . Reviewed medications with patient and discussed PCP's instructions to restart Lasix and to increase hydralazine dose . Collaborated with Theadore Nan, [name] regarding patient's psychosocial needs in relation to [place] Stay in Place  Orders . Discussed plans with patient for ongoing care management follow up and provided patient with direct contact information for care management team . Advised patient, providing education and rationale, to monitor blood pressure daily and record, calling [phone] for findings outside established parameters.  . Reviewed scheduled/upcoming provider appointments including: Follow up with PCP in 2 weeks . Discussed current symptoms:   o Patient states that she feels weak and washed out . Discussed home readings o Most recent was taken this morning: 180/90 . Reviewed previous in office BP results BP Readings from Last 3 Encounters:  10/21/18 (!) 179/85  10/14/18 (!) 183/83  05/20/18 (!) 171/86   Patient Self Care Activities:  . Self administers medications as prescribed . Calls pharmacy for medication refills . Calls provider office for new concerns or questions . Feels unable to leave her home safely on her own  Initial goal documentation        The patient verbalized understanding of instructions provided today and declined a print copy of patient instruction materials.   The care management team will reach out to the patient again over the next 10 days.   Orders Placed This Encounter  Procedures  . Ambulatory referral to [place], RN-BC, BSN Nurse Care Manager [place] [phone]

## 2018-10-30 ENCOUNTER — Other Ambulatory Visit: Payer: Self-pay | Admitting: Family Medicine

## 2018-11-03 ENCOUNTER — Ambulatory Visit (INDEPENDENT_AMBULATORY_CARE_PROVIDER_SITE_OTHER): Payer: PPO | Admitting: Physician Assistant

## 2018-11-03 ENCOUNTER — Ambulatory Visit (HOSPITAL_COMMUNITY)
Admission: RE | Admit: 2018-11-03 | Discharge: 2018-11-03 | Disposition: A | Discharge status: Home / Self Care | Payer: PPO | Source: Ambulatory Visit | Attending: Physician Assistant | Admitting: Physician Assistant

## 2018-11-03 ENCOUNTER — Other Ambulatory Visit: Payer: Self-pay

## 2018-11-03 ENCOUNTER — Encounter: Payer: Self-pay | Admitting: Physician Assistant

## 2018-11-03 VITALS — BP 135/69 | HR 65 | Temp 97.7°F | Ht <= 58 in | Wt 153.0 lb

## 2018-11-03 DIAGNOSIS — M150 Primary generalized (osteo)arthritis: Secondary | ICD-10-CM

## 2018-11-03 DIAGNOSIS — M159 Polyosteoarthritis, unspecified: Secondary | ICD-10-CM

## 2018-11-03 DIAGNOSIS — G44319 Acute post-traumatic headache, not intractable: Secondary | ICD-10-CM | POA: Insufficient documentation

## 2018-11-03 DIAGNOSIS — M810 Age-related osteoporosis without current pathological fracture: Secondary | ICD-10-CM | POA: Diagnosis not present

## 2018-11-03 DIAGNOSIS — M5136 Other intervertebral disc degeneration, lumbar region: Secondary | ICD-10-CM

## 2018-11-03 DIAGNOSIS — Z9181 History of falling: Secondary | ICD-10-CM

## 2018-11-03 DIAGNOSIS — R51 Headache: Secondary | ICD-10-CM | POA: Diagnosis not present

## 2018-11-03 NOTE — Progress Notes (Signed)
BP 135/69   Pulse 65   Temp 97.7 F (36.5 C) (Oral)   Ht 4' 10" (1.473 m)   Wt 153 lb (69.4 kg)   BMI 31.98 kg/m    Subjective:    Patient ID: [name], female    DOB: 05-10-1925, 85 y.o.   MRN: [id number]  HPI: ONYINYECHI HUANTE is a 85 y.o. female presenting on 11/03/2018 for Fall (fell saturday and hit head) and Headache  This patient comes them because of a fall where she hit the posterior area of her head.  The accident was on 07/01/2018.  She was at home walking out of her bathroom and turning a corner around a piece of furniture and lost her balance and fell back.  She did have her walker but it was slightly ahead of her and she had a glass in her hand.  She had pain in the posterior area of her head.  It seemed to get a little bit better.  However in the last couple days it is increasing in pain again.  She denies any other neurologic symptoms such as visual disturbance.  In reviewing her chart she is a high risk individual for falls.  She has multiple reasons to have unsteady gait.  It would be recommended for her to have home health come and work with her in the area of nursing care and occupational and physical therapy.  We will order a CT of her head today.   Past Medical History:  Diagnosis Date  . Arthritis   . Cancer ([place])    breast  . Cataract   . Diabetes mellitus without complication ([place])   . Frequent UTI   . Hyperlipidemia   . Hypertension   . Neck pain   . Scoliosis   . Stroke [facility])    Relevant past medical, surgical, family and social history reviewed and updated as indicated. Interim medical history since our last visit reviewed. Allergies and medications reviewed and updated. DATA REVIEWED: CHART IN EPIC  Family History reviewed for pertinent findings.  Review of Systems  Constitutional: Negative.   HENT: Negative.   Eyes: Negative.   Respiratory: Negative.   Gastrointestinal: Negative.   Genitourinary: Negative.   Musculoskeletal:  Positive for arthralgias and myalgias.  Skin: Positive for color change.    Allergies as of 11/03/2018      Reactions   Diltiazem Hcl    Red itchy rash started a few hours after taking short acting 132m dilt tabs   Ace Inhibitors Cough      Medication List       Accurate as of November 03, 2018  3:55 PM. If you have any questions, ask your nurse or doctor.        apixaban 5 MG Tabs tablet Commonly known as: ELIQUIS Take 1 tablet (5 mg total) by mouth 2 (two) times daily.   D-Mannose 500 MG Caps Take 500 mg by mouth daily.   diclofenac sodium 1 % Gel Commonly known as: VOLTAREN Apply 2 g topically 4 (four) times daily.   furosemide 40 MG tablet Commonly known as: LASIX Take 1 tablet (40 mg total) by mouth daily for 30 days.   gabapentin 100 MG capsule Commonly known as: NEURONTIN Take 300 mg by mouth 2 (two) times daily.   glucose blood test strip Commonly known as: ONE TOUCH ULTRA TEST USE TO check blood sugars twice daily   hydrALAZINE 50 MG tablet Commonly known as: APRESOLINE Take 1  tablet (50 mg total) by mouth 2 (two) times daily for 30 days.   Insulin Pen Needle 31G X 8 MM Misc Use once daily with lantus solostar   Lantus SoloStar 100 UNIT/ML Solostar Pen Generic drug: Insulin Glargine INJECT 50 UNITS ONCE DAILY AT 10PM   Magnesium 250 MG Tabs Take 250 mg by mouth daily.   metFORMIN 500 MG tablet Commonly known as: GLUCOPHAGE Take 1 Tablet by mouth once daily with BREAKFAST   nabumetone 500 MG tablet Commonly known as: RELAFEN Take 1 tablet (500 mg total) by mouth 2 (two) times daily. For muscle and joint pain   omeprazole 20 MG capsule Commonly known as: PRILOSEC TAKE  (1)  CAPSULE  TWICE DAILY (TAKE ON AN EMPTY STOMACH AT LEAST 30MIN- UTES BEFORE MEALS).   ondansetron 4 MG tablet Commonly known as: ZOFRAN TAKE 1 TABLET EVERY 8 HOURS AS NEEDED FOR NAUSEA & VOMITING   rOPINIRole 0.25 MG tablet Commonly known as: REQUIP Take 1 tablet (0.25 mg  total) by mouth 3 (three) times daily for 30 days. Take one tablet in the morning, one tablet at noon and two tablets at bedtime.   SUPER B COMPLEX/C PO Take 1 tablet by mouth daily.   triamcinolone 0.025 % ointment Commonly known as: KENALOG Apply 1 application topically 2 (two) times daily. What changed:   when to take this  reasons to take this   Vitamin D3 125 MCG (5000 UT) Caps Take 2,000 Units by mouth daily.          Objective:    BP 135/69   Pulse 65   Temp 97.7 F (36.5 C) (Oral)   Ht 4' 10" (1.473 m)   Wt 153 lb (69.4 kg)   BMI 31.98 kg/m   Allergies  Allergen Reactions  . Diltiazem Hcl     Red itchy rash started a few hours after taking short acting 110m dilt tabs  . Ace Inhibitors Cough    Wt Readings from Last 3 Encounters:  11/03/18 153 lb (69.4 kg)  10/21/18 152 lb (68.9 kg)  10/14/18 152 lb (68.9 kg)    Physical Exam Constitutional:      Appearance: She is well-developed.  HENT:     Head: Normocephalic.      Comments: Swelling and tenderness on the scalp, with blue skin discoloration Eyes:     Conjunctiva/sclera: Conjunctivae normal.     Pupils: Pupils are equal, round, and reactive to light.  Cardiovascular:     Rate and Rhythm: Normal rate and regular rhythm.     Heart sounds: Normal heart sounds.  Pulmonary:     Effort: Pulmonary effort is normal.     Breath sounds: Normal breath sounds.  Abdominal:     General: Bowel sounds are normal.     Palpations: Abdomen is soft.  Skin:    General: Skin is warm and dry.     Findings: No rash.  Neurological:     Mental Status: She is alert and oriented to person, place, and time.     Deep Tendon Reflexes: Reflexes are normal and symmetric.  Psychiatric:        Behavior: Behavior normal.        Thought Content: Thought content normal.        Judgment: Judgment normal.     Results for orders placed or performed in visit on 10/14/18  CMP14+EGFR  Result Value Ref Range   Glucose 85 65  - 99 mg/dL   BUN 20  10 - 36 mg/dL   Creatinine, Ser 0.80 0.57 - 1.00 mg/dL   GFR calc non Af Amer 65 >59 mL/min/1.73   GFR calc Af Amer 75 >59 mL/min/1.73   BUN/Creatinine Ratio 25 12 - 28   Sodium 145 (H) 134 - 144 mmol/L   Potassium 4.6 3.5 - 5.2 mmol/L   Chloride 102 96 - 106 mmol/L   CO2 23 20 - 29 mmol/L   Calcium 9.4 8.7 - 10.3 mg/dL   Total Protein 6.7 6.0 - 8.5 g/dL   Albumin 4.3 3.5 - 4.6 g/dL   Globulin, Total 2.4 1.5 - 4.5 g/dL   Albumin/Globulin Ratio 1.8 1.2 - 2.2   Bilirubin Total 0.4 0.0 - 1.2 mg/dL   Alkaline Phosphatase 61 39 - 117 IU/L   AST 21 0 - 40 IU/L   ALT 24 0 - 32 IU/L  CBC with Differential/Platelet  Result Value Ref Range   WBC 8.0 3.4 - 10.8 x10E3/uL   RBC 5.47 (H) 3.77 - 5.28 x10E6/uL   Hemoglobin 13.5 11.1 - 15.9 g/dL   Hematocrit 45.3 34.0 - 46.6 %   MCV 83 79 - 97 fL   MCH 24.7 (L) 26.6 - 33.0 pg   MCHC 29.8 (L) 31.5 - 35.7 g/dL   RDW 17.9 (H) 11.7 - 15.4 %   Platelets 251 150 - 450 x10E3/uL   Neutrophils 66 Not Estab. %   Lymphs 19 Not Estab. %   Monocytes 10 Not Estab. %   Eos 4 Not Estab. %   Basos 1 Not Estab. %   Neutrophils Absolute 5.2 1.4 - 7.0 x10E3/uL   Lymphocytes Absolute 1.5 0.7 - 3.1 x10E3/uL   Monocytes Absolute 0.8 0.1 - 0.9 x10E3/uL   EOS (ABSOLUTE) 0.3 0.0 - 0.4 x10E3/uL   Basophils Absolute 0.1 0.0 - 0.2 x10E3/uL   Immature Granulocytes 0 Not Estab. %   Immature Grans (Abs) 0.0 0.0 - 0.1 x10E3/uL  Lipid panel  Result Value Ref Range   Cholesterol, Total 222 (H) 100 - 199 mg/dL   Triglycerides 209 (H) 0 - 149 mg/dL   HDL 38 (L) >39 mg/dL   VLDL Cholesterol Cal 42 (H) 5 - 40 mg/dL   LDL Calculated 142 (H) 0 - 99 mg/dL   Chol/HDL Ratio 5.8 (H) 0.0 - 4.4 ratio  TSH  Result Value Ref Range   TSH 1.630 0.450 - 4.500 uIU/mL  Microalbumin / creatinine urine ratio  Result Value Ref Range   Creatinine, Urine 42.2 Not Estab. mg/dL   Microalbumin, Urine 27.4 Not Estab. ug/mL   Microalb/Creat Ratio 65 (H) 0 - 29 mg/g  creat  Bayer DCA Hb A1c Waived  Result Value Ref Range   HB A1C (BAYER DCA - WAIVED) 6.2 <7.0 %      Assessment & Plan:   1. Acute post-traumatic headache, not intractable - CT Head Wo Contrast; Future  2. At high risk for falls Home health  3. Primary osteoarthritis involving multiple joints Home health  4. Age-related osteoporosis without current pathological fracture Home health  5. Other intervertebral disc degeneration, lumbar region Home health   Continue all other maintenance medications as listed above.  Follow up plan: No follow-ups on file.  Educational handout given for [place] PA-C [place] [address]  [place], [place] [address] [phone]   11/03/2018, 3:55 PM

## 2018-11-04 ENCOUNTER — Telehealth: Payer: Self-pay | Admitting: Family Medicine

## 2018-11-04 NOTE — Telephone Encounter (Signed)
Patient aware of results.

## 2018-11-09 DIAGNOSIS — Z794 Long term (current) use of insulin: Secondary | ICD-10-CM | POA: Diagnosis not present

## 2018-11-09 DIAGNOSIS — Z9181 History of falling: Secondary | ICD-10-CM | POA: Diagnosis not present

## 2018-11-09 DIAGNOSIS — E1159 Type 2 diabetes mellitus with other circulatory complications: Secondary | ICD-10-CM | POA: Diagnosis not present

## 2018-11-09 DIAGNOSIS — Z87440 Personal history of urinary (tract) infections: Secondary | ICD-10-CM | POA: Diagnosis not present

## 2018-11-09 DIAGNOSIS — R5381 Other malaise: Secondary | ICD-10-CM | POA: Diagnosis not present

## 2018-11-09 DIAGNOSIS — E785 Hyperlipidemia, unspecified: Secondary | ICD-10-CM | POA: Diagnosis not present

## 2018-11-09 DIAGNOSIS — M810 Age-related osteoporosis without current pathological fracture: Secondary | ICD-10-CM | POA: Diagnosis not present

## 2018-11-09 DIAGNOSIS — I152 Hypertension secondary to endocrine disorders: Secondary | ICD-10-CM | POA: Diagnosis not present

## 2018-11-09 DIAGNOSIS — M5136 Other intervertebral disc degeneration, lumbar region: Secondary | ICD-10-CM | POA: Diagnosis not present

## 2018-11-09 DIAGNOSIS — Z8673 Personal history of transient ischemic attack (TIA), and cerebral infarction without residual deficits: Secondary | ICD-10-CM | POA: Diagnosis not present

## 2018-11-09 DIAGNOSIS — M150 Primary generalized (osteo)arthritis: Secondary | ICD-10-CM | POA: Diagnosis not present

## 2018-11-09 DIAGNOSIS — Z7901 Long term (current) use of anticoagulants: Secondary | ICD-10-CM | POA: Diagnosis not present

## 2018-11-09 DIAGNOSIS — M542 Cervicalgia: Secondary | ICD-10-CM | POA: Diagnosis not present

## 2018-11-10 ENCOUNTER — Ambulatory Visit (INDEPENDENT_AMBULATORY_CARE_PROVIDER_SITE_OTHER): Payer: PPO | Admitting: *Deleted

## 2018-11-10 DIAGNOSIS — Z0000 Encounter for general adult medical examination without abnormal findings: Secondary | ICD-10-CM | POA: Diagnosis not present

## 2018-11-10 NOTE — [facility] Notes (Signed)
MEDICARE ANNUAL WELLNESS VISIT  11/10/2018  Telephone Visit Disclaimer This Medicare AWV was conducted by telephone due to national recommendations for restrictions regarding the COVID-19 Pandemic (e.g. social distancing).  I verified, using two identifiers, that I am speaking with [name] or their authorized healthcare agent. I discussed the limitations, risks, security, and privacy concerns of performing an evaluation and management service by telephone and the potential availability of an in-person appointment in the future. The patient expressed understanding and agreed to proceed.   Subjective:  [name] is a 85 y.o. female patient of [name], [name] who had a Medicare Annual Wellness Visit today via telephone. [name] is Retired and lives alone. she has 2 children. she reports that she is socially active and does interact with friends/family regularly. she is minimally physically active and enjoys listening to music and reading [place] literature.  Patient Care Team: Baruch Gouty, FNP as PCP - General (Family Medicine) Steffanie Rainwater, DPM as Consulting Physician (Podiatry) Marin Comment, Allison Quarry, MD as Consulting Physician (Optometry) Ilean China, RN as Case Manager [place], Norva Riffle, [name] as Social Worker (Licensed Clinical Social Worker)  Advanced Directives 11/10/2018 05/13/2018 12/07/2017 12/06/2017 12/06/2017 11/04/2016  Does Patient Have a Medical Advance Directive? Yes Yes Yes Yes Yes Yes  Type of Paramedic of [place];Living will [place];Living will [place];Living will [place];Living will [place];Living will [place];Living will  Does patient want to make changes to medical advance directive? No - Patient declined No - Patient declined No - Patient declined No - Patient declined - No - Patient declined  Copy of [place] in  Chart? No - copy requested No - copy requested Yes Yes No - copy requested No - copy requested  Would patient like information on creating a medical advance directive? No - Patient declined - - - - [facility] Utilization Over the Past 12 Months: # of hospitalizations or ER visits: 1 # of surgeries: 0  Review of Systems    Patient reports that her overall health is worse compared to last year.  Patient Reported Readings (BP, Pulse, CBG, Weight, etc) none  Review of Systems: No complaints  All other systems negative.  Pain Assessment       Current Medications & Allergies (verified) Allergies as of 11/10/2018      Reactions   Diltiazem Hcl    Red itchy rash started a few hours after taking short acting 120mg  dilt tabs   Ace Inhibitors Cough      Medication List       Accurate as of November 10, 2018  1:58 PM. If you have any questions, ask your nurse or doctor.        STOP taking these medications   diclofenac sodium 1 % Gel Commonly known as: VOLTAREN   furosemide 40 MG tablet Commonly known as: LASIX   gabapentin 100 MG capsule Commonly known as: NEURONTIN   triamcinolone 0.025 % ointment Commonly known as: KENALOG     TAKE these medications   apixaban 5 MG Tabs tablet Commonly known as: ELIQUIS Take 1 tablet (5 mg total) by mouth 2 (two) times daily.   D-Mannose 500 MG Caps Take 500 mg by mouth daily.   glucose blood test strip Commonly known as: ONE TOUCH ULTRA TEST USE TO check blood sugars twice daily   hydrALAZINE  50 MG tablet Commonly known as: APRESOLINE Take 1 tablet (50 mg total) by mouth 2 (two) times daily for 30 days.   Insulin Pen Needle 31G X 8 MM Misc Use once daily with lantus solostar   Lantus SoloStar 100 UNIT/ML Solostar Pen Generic drug: Insulin Glargine INJECT 50 UNITS ONCE DAILY AT 10PM   Magnesium 250 MG Tabs Take 250 mg by mouth daily.   metFORMIN 500 MG tablet Commonly known as: GLUCOPHAGE Take 1 Tablet by mouth once  daily with BREAKFAST   nabumetone 500 MG tablet Commonly known as: RELAFEN Take 1 tablet (500 mg total) by mouth 2 (two) times daily. For muscle and joint pain   omeprazole 20 MG capsule Commonly known as: PRILOSEC TAKE  (1)  CAPSULE  TWICE DAILY (TAKE ON AN EMPTY STOMACH AT LEAST 30MIN- UTES BEFORE MEALS).   ondansetron 4 MG tablet Commonly known as: ZOFRAN TAKE 1 TABLET EVERY 8 HOURS AS NEEDED FOR NAUSEA & VOMITING   rOPINIRole 0.25 MG tablet Commonly known as: REQUIP Take 1 tablet (0.25 mg total) by mouth 3 (three) times daily for 30 days. Take one tablet in the morning, one tablet at noon and two tablets at bedtime.   SUPER B COMPLEX/C PO Take 1 tablet by mouth daily.   Vitamin D3 125 MCG (5000 UT) Caps Take 2,000 Units by mouth daily.       History (reviewed): Past Medical History:  Diagnosis Date  . Arthritis   . Cancer ([place])    breast  . Cataract   . Diabetes mellitus without complication ([place])   . Frequent UTI   . Hyperlipidemia   . Hypertension   . Neck pain   . Scoliosis   . Stroke [facility])    Past Surgical History:  Procedure Laterality Date  . APPENDECTOMY    . BREAST LUMPECTOMY Left   . CHOLECYSTECTOMY    . JOINT REPLACEMENT     bilat knee   . REPLACEMENT TOTAL KNEE BILATERAL Bilateral   . TONSILLECTOMY     Family History  Problem Relation Age of Onset  . Cancer Mother        lung  . Arthritis Mother   . Heart disease Father        endocarditis  . Cancer Brother        lung, throat  . Alcohol abuse Brother    Social History   Socioeconomic History  . Marital status: Widowed    Spouse name: Not on file  . Number of children: 2  . Years of education: 51  . Highest education level: Some college, no degree  Occupational History  . Occupation: Retired    Fish farm manager: Scientist, forensic    Comment: Network engineer  Social Needs  . Financial resource strain: Not very hard  . Food insecurity    Worry: Never true    Inability: Never true  .  Transportation needs    Medical: No    Non-medical: No  Tobacco Use  . Smoking status: Never Smoker  . Smokeless tobacco: Never Used  Substance and Sexual Activity  . Alcohol use: No    Alcohol/week: 0.0 standard drinks  . Drug use: No  . Sexual activity: Not Currently  Lifestyle  . Physical activity    Days per week: 0 days    Minutes per session: 0 min  . Stress: To some extent  Relationships  . Social Herbalist on phone: Three times a week    Gets  together: Never    Attends religious service: Never    Active member of club or organization: No    Attends meetings of clubs or organizations: Never    Relationship status: Widowed  Other Topics Concern  . Not on file  Social History Narrative   Lives alone in a one story home.  Husband passed away. She has two sons 2 sons but.  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  Education: specialty courses with [employer], high school education.    Activities of Daily Living In your present state of health, do you have any difficulty performing the following activities: 11/10/2018 12/06/2017  Hearing? N N  Vision? N N  Difficulty concentrating or making decisions? N N  Walking or climbing stairs? Y Y  Comment due to leg weakness -  Dressing or bathing? N N  Doing errands, shopping? N N  Preparing Food and eating ? N -  Using the Toilet? N -  In the past six months, have you accidently leaked urine? N -  Do you have problems with loss of bowel control? N -  Managing your Medications? N -  Managing your Finances? N -  Housekeeping or managing your Housekeeping? N -  Some recent data might be hidden    Patient Literacy How often do you need to have someone help you when you read instructions, pamphlets, or other written materials from your doctor or pharmacy?: 1 - Never What is the last grade level you completed in school?: 12th grade-some college  Exercise Current Exercise Habits: The patient does not participate in regular  exercise at present, Exercise limited by: orthopedic condition(s)  Diet Patient reports consuming 3 meals a day and 1 snack(s) a day Patient reports that her primary diet is: Regular Patient reports that she does have regular access to food.   Depression Screen PHQ 2/9 Scores 11/10/2018 11/03/2018 10/21/2018 09/10/2018 05/20/2018 04/22/2018 04/16/2018  PHQ - 2 Score 2 0 0 0 0 0 0  PHQ- 9 Score 3 - 0 - - - 6  Exception Documentation - - - - - - -     Fall Risk Fall Risk  11/10/2018 11/03/2018 10/21/2018 05/20/2018 04/22/2018  Falls in the past year? 1 1 0 0 0  Number falls in past yr: 1 0 - - -  Injury with Fall? 1 1 - - -  Risk for fall due to : Impaired balance/gait;History of fall(s) - - - -  Follow up Falls prevention discussed - - - -  Comment discussed removing throw rugs and having adequate lighting in the house - - - -     Objective:  [name] seemed alert and oriented and she participated appropriately during our telephone visit.  Blood Pressure Weight BMI  BP Readings from Last 3 Encounters:  11/03/18 135/69  10/21/18 (!) 179/85  10/14/18 (!) 183/83   Wt Readings from Last 3 Encounters:  11/03/18 153 lb (69.4 kg)  10/21/18 152 lb (68.9 kg)  10/14/18 152 lb (68.9 kg)   BMI Readings from Last 1 Encounters:  11/03/18 31.98 kg/m    *Unable to obtain current vital signs, weight, and BMI due to telephone visit type  Hearing/Vision  . Jennife did not seem to have difficulty with hearing/understanding during the telephone conversation . Reports that she has not had a formal eye exam by an eye care professional within the past year . Reports that she has not had a formal hearing evaluation within the past year *  Unable to fully assess hearing and vision during telephone visit type  Cognitive Function: 6CIT Screen 11/10/2018  What Year? 0 points  What month? 0 points  What time? 0 points  Count back from 20 0 points  Months in reverse 0 points  Repeat phrase 0 points  Total  Score 0   (Normal:0-7, Significant for Dysfunction: >8)  Normal Cognitive Function Screening: Yes   Immunization & Health Maintenance Record Immunization History  Administered Date(s) Administered  . Influenza Whole 03/07/2005  . Influenza, High Dose Seasonal PF 02/20/2017, 01/29/2018  . Influenza,inj,Quad PF,6+ Mos 02/11/2013, 01/31/2014, 02/06/2015, 02/18/2016  . Pneumococcal Conjugate-13 07/11/2014  . Pneumococcal Polysaccharide-23 11/14/2015  . Tdap 01/04/2011  . Zoster 05/29/2012    Health Maintenance  Topic Date Due  . FOOT EXAM  01/14/2018  . OPHTHALMOLOGY EXAM  06/03/2018  . DEXA SCAN  11/11/2018  . INFLUENZA VACCINE  12/04/2018  . HEMOGLOBIN A1C  04/15/2019  . TETANUS/TDAP  01/03/2021  . PNA vac Low Risk Adult  Completed       Assessment  This is a routine wellness examination for [employer].  Health Maintenance: Due or Overdue Health Maintenance Due  Topic Date Due  . FOOT EXAM  01/14/2018  . OPHTHALMOLOGY EXAM  06/03/2018    [name] does not need a referral for Community Assistance: Care Management:   no Social Work:    no Prescription Assistance:  no Nutrition/Diabetes Education:  no   Plan:  Personalized Goals Goals Addressed            This Visit's Progress   . DIET - INCREASE WATER INTAKE       Try to drink 6-8 glasses of water daily.      Personalized Health Maintenance & Screening Recommendations  Advanced directives: has an advanced directive - a copy HAS NOT been provided.  Lung Cancer Screening Recommended: no (Low Dose CT Chest recommended if Age 52-80 years, 30 pack-year currently smoking OR have quit w/in past 15 years) Hepatitis C Screening recommended: no HIV Screening recommended: no  Advanced Directives: Written information was not prepared per patient's request.  Referrals & Orders No orders of the defined types were placed in this encounter.   Follow-up Plan . Follow-up with Baruch Gouty, FNP as  planned . Please bring [place] a copy of your Advanced Directives for our records.   I have personally reviewed and noted the following in the patient's chart:   . Medical and social history . Use of alcohol, tobacco or illicit drugs  . Current medications and supplements . Functional ability and status . Nutritional status . Physical activity . Advanced directives . List of other physicians . Hospitalizations, surgeries, and ER visits in previous 12 months . Vitals . Screenings to include cognitive, depression, and falls . Referrals and appointments  In addition, I have reviewed and discussed with [name] certain preventive protocols, quality metrics, and best practice recommendations. A written personalized care plan for preventive services as well as general preventive health recommendations is available and can be mailed to the patient at her request.      Marylin Crosby, LPN  10/05/9526

## 2018-11-10 NOTE — Patient Instructions (Signed)
Preventive Care 38 Years and Older, Female Preventive care refers to lifestyle choices and visits with your health care provider that can promote health and wellness. This includes:  A yearly physical exam. This is also called an annual well check.  Regular dental and eye exams.  Immunizations.  Screening for certain conditions.  Healthy lifestyle choices, such as diet and exercise. What can I expect for my preventive care visit? Physical exam Your health care provider will check:  Height and weight. These may be used to calculate body mass index (BMI), which is a measurement that tells if you are at a healthy weight.  Heart rate and blood pressure.  Your skin for abnormal spots. Counseling Your health care provider may ask you questions about:  Alcohol, tobacco, and drug use.  Emotional well-being.  Home and relationship well-being.  Sexual activity.  Eating habits.  History of falls.  Memory and ability to understand (cognition).  Work and work Statistician.  Pregnancy and menstrual history. What immunizations do I need?  Influenza (flu) vaccine  This is recommended every year. Tetanus, diphtheria, and pertussis (Tdap) vaccine  You may need a Td booster every 10 years. Varicella (chickenpox) vaccine  You may need this vaccine if you have not already been vaccinated. Zoster (shingles) vaccine  You may need this after age 33. Pneumococcal conjugate (PCV13) vaccine  One dose is recommended after age 33. Pneumococcal polysaccharide (PPSV23) vaccine  One dose is recommended after age 72. Measles, mumps, and rubella (MMR) vaccine  You may need at least one dose of MMR if you were born in 1957 or later. You may also need a second dose. Meningococcal conjugate (MenACWY) vaccine  You may need this if you have certain conditions. Hepatitis A vaccine  You may need this if you have certain conditions or if you travel or work in places where you may be exposed  to hepatitis A. Hepatitis B vaccine  You may need this if you have certain conditions or if you travel or work in places where you may be exposed to hepatitis B. Haemophilus influenzae type b (Hib) vaccine  You may need this if you have certain conditions. You may receive vaccines as individual doses or as more than one vaccine together in one shot (combination vaccines). Talk with your health care provider about the risks and benefits of combination vaccines. What tests do I need? Blood tests  Lipid and cholesterol levels. These may be checked every 5 years, or more frequently depending on your overall health.  Hepatitis C test.  Hepatitis B test. Screening  Lung cancer screening. You may have this screening every year starting at age 39 if you have a 30-pack-year history of smoking and currently smoke or have quit within the past 15 years.  Colorectal cancer screening. All adults should have this screening starting at age 36 and continuing until age 15. Your health care provider may recommend screening at age 23 if you are at increased risk. You will have tests every 1-10 years, depending on your results and the type of screening test.  Diabetes screening. This is done by checking your blood sugar (glucose) after you have not eaten for a while (fasting). You may have this done every 1-3 years.  Mammogram. This may be done every 1-2 years. Talk with your health care provider about how often you should have regular mammograms.  BRCA-related cancer screening. This may be done if you have a family history of breast, ovarian, tubal, or peritoneal cancers.  Other tests  Sexually transmitted disease (STD) testing.  Bone density scan. This is done to screen for osteoporosis. You may have this done starting at age 76. Follow these instructions at home: Eating and drinking  Eat a diet that includes fresh fruits and vegetables, whole grains, lean protein, and low-fat dairy products. Limit  your intake of foods with high amounts of sugar, saturated fats, and salt.  Take vitamin and mineral supplements as recommended by your health care provider.  Do not drink alcohol if your health care provider tells you not to drink.  If you drink alcohol: ? Limit how much you have to 0-1 drink a day. ? Be aware of how much alcohol is in your drink. In the [place] one drink equals one 12 oz bottle of beer (355 mL), one 5 oz glass of wine (148 mL), or one 1 oz glass of hard liquor (44 mL). Lifestyle  Take daily care of your teeth and gums.  Stay active. Exercise for at least 30 minutes on 5 or more days each week.  Do not use any products that contain nicotine or tobacco, such as cigarettes, e-cigarettes, and chewing tobacco. If you need help quitting, ask your health care provider.  If you are sexually active, practice safe sex. Use a condom or other form of protection in order to prevent STIs (sexually transmitted infections).  Talk with your health care provider about taking a low-dose aspirin or statin. What's next?  Go to your health care provider once a year for a well check visit.  Ask your health care provider how often you should have your eyes and teeth checked.  Stay up to date on all vaccines. This information is not intended to replace advice given to you by your health care provider. Make sure you discuss any questions you have with your health care provider. Document Released: 05/18/2015 Document Revised: 04/15/2018 Document Reviewed: 04/15/2018 Elsevier Patient Education  2020 [employer].

## 2018-11-15 ENCOUNTER — Ambulatory Visit: Payer: PPO | Admitting: Family Medicine

## 2018-11-18 ENCOUNTER — Ambulatory Visit (INDEPENDENT_AMBULATORY_CARE_PROVIDER_SITE_OTHER): Payer: PPO | Admitting: Licensed Clinical Social Worker

## 2018-11-18 DIAGNOSIS — Z853 Personal history of malignant neoplasm of breast: Secondary | ICD-10-CM

## 2018-11-18 DIAGNOSIS — M150 Primary generalized (osteo)arthritis: Secondary | ICD-10-CM | POA: Diagnosis not present

## 2018-11-18 DIAGNOSIS — I5033 Acute on chronic diastolic (congestive) heart failure: Secondary | ICD-10-CM

## 2018-11-18 DIAGNOSIS — I4891 Unspecified atrial fibrillation: Secondary | ICD-10-CM | POA: Diagnosis not present

## 2018-11-18 DIAGNOSIS — K219 Gastro-esophageal reflux disease without esophagitis: Secondary | ICD-10-CM

## 2018-11-18 DIAGNOSIS — I10 Essential (primary) hypertension: Secondary | ICD-10-CM

## 2018-11-18 DIAGNOSIS — M159 Polyosteoarthritis, unspecified: Secondary | ICD-10-CM

## 2018-11-18 DIAGNOSIS — J8410 Pulmonary fibrosis, unspecified: Secondary | ICD-10-CM

## 2018-11-18 DIAGNOSIS — Z604 Social exclusion and rejection: Secondary | ICD-10-CM

## 2018-11-18 DIAGNOSIS — E1159 Type 2 diabetes mellitus with other circulatory complications: Secondary | ICD-10-CM | POA: Diagnosis not present

## 2018-11-18 NOTE — Patient Instructions (Signed)
Licensed Clinical Social Worker Visit Information  Goals we discussed today:   Goals        . "I would like to have some help with chores around my home" (pt-stated)     Current Barriers:  Marland Kitchen Knowledge Deficits related to resource options available to her . Lacks caregiver support. Son is available by phone but does not live locally.  . 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   Nurse Case Manager Clinical Goal(s):   Marland Kitchen Over the next 30 days, patient will communicate with [name] to discuss services of assistance that may help client in her home..   Interventions:   Offered information on [place] [phone]  Talked with client about ADLs and in home care needs of client  Talked with client about ambulation, falls, dizziness and blood pressure . Encouraged       client to talk with RNCM or PCP about blood pressure readings of client  Patient Self Care Activities:  . Self administers medications as prescribed . Attends all scheduled provider appointments . Calls pharmacy for medication refills . Attends church or other social activities . Performs ADL's independently . Performs IADL's independently . Calls provider office for new concerns or questions   Plan:  Client to attend scheduled medical appointments. Client to communicate with RNCM as needed to discuss nursing needs [name] to call client in next 3 weeks to assess psychosocial needs of client  at that time     Please see past updates related to this goal by clicking on the "Past Updates" button in the selected goal              Materials Provided: No  Follow Up Plan:  [name] to call client in next 3 weeks to assess psychosocial needs of client at   that time.    The patient verbalized understanding of instructions provided today and declined a print copy of patient instruction materials.   Norva Riffle.Soumya Colson MSW, [name] Licensed Clinical  Social Worker [facility] Care Management [phone]

## 2018-11-18 NOTE — Chronic Care Management (AMB) (Signed)
  Care Management Note   [name] is a 85 y.o. year old female who is a primary care patient of Baruch Gouty, FNP. The CM team was consulted for assistance with chronic disease management and care coordination.   I reached out to [name] by phone today.   Review of patient status, including review of consultants reports, relevant laboratory and other test results, and collaboration with appropriate care team members and the patient's provider was performed as part of comprehensive patient evaluation and provision of chronic care management services.    Social Determinants of Health:risk for Social Isolation; risk for Depression; risk for Stress    Clinical Support from 11/10/2018 in [place]  PHQ-9 Total Score  3     Goals        . "I would like to have some help with chores around my home" (pt-stated)     Current Barriers:  Marland Kitchen Knowledge Deficits related to resource options available to her . Lacks caregiver support. Son is available by phone but does not live locally.  . 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   Nurse Case Manager Clinical Goal(s):   Marland Kitchen Over the next 30 days, patient will communicate with [name] to discuss services of assistance that may help client in her home..   Interventions:   Previously [name] offered information on [place] [phone]  [name] talked with client about in home care needs of client  Talked with client about ambulation challenges, dizziness and blood pressure readings.and encouraged client to talk with RNCM or PCP about her blood pressure concerns  Patient Self Care Activities:  . Self administers medications as prescribed . Attends all scheduled provider appointments . Calls pharmacy for medication refills . Performs ADL's independently . Performs IADL's independently . Calls provider office for new concerns or questions    Plan:  Client to attend scheduled medical appointments Client to communicate, as needed with ADTS representative regarding in home services available [name] to call client in next 3 weeks to assess client psychosocial needs at that time Client to communicate, as needed with RNCM regarding nursing needs of client  Please see past updates related to this goal by clicking on the "Past Updates" button in the selected goal      Client said she received physical therapy recently . She said she has received physical therapy sessions 3 times at present. She said her left hip is sore. She said she is limping slightly when walking.  She has her prescribed medications and is taking medications as prescribed. She said she has appointment scheduled with Darla Lesches on 12/03/2018.  She said she is still concerned over her blood pressure readings .  She said she does not want to have a stroke . She does well with ADLs but has trouble with buttons when dressing.  She has appointment with cardiologist in August of 2020.  [name] encouraged client to call RNCM as needed to discuss nursing needs . Client was appreciative of call from [name] on 11/18/2018  Follow Up Plan: [name] to call client in next 3 weeks to assess psychosocial needs of client at that time  Norva Riffle.Areeb Corron MSW, [name] Licensed Clinical Social Worker [facility] Care Management [phone]

## 2018-11-27 ENCOUNTER — Other Ambulatory Visit: Payer: Self-pay | Admitting: Family Medicine

## 2018-12-02 ENCOUNTER — Other Ambulatory Visit: Payer: Self-pay

## 2018-12-03 ENCOUNTER — Encounter: Payer: Self-pay | Admitting: Family Medicine

## 2018-12-03 ENCOUNTER — Ambulatory Visit (INDEPENDENT_AMBULATORY_CARE_PROVIDER_SITE_OTHER): Payer: PPO | Admitting: Family Medicine

## 2018-12-03 VITALS — BP 174/83 | HR 67 | Temp 98.4°F | Ht <= 58 in | Wt 153.0 lb

## 2018-12-03 DIAGNOSIS — E1159 Type 2 diabetes mellitus with other circulatory complications: Secondary | ICD-10-CM | POA: Diagnosis not present

## 2018-12-03 DIAGNOSIS — Z87440 Personal history of urinary (tract) infections: Secondary | ICD-10-CM

## 2018-12-03 DIAGNOSIS — I10 Essential (primary) hypertension: Secondary | ICD-10-CM | POA: Diagnosis not present

## 2018-12-03 DIAGNOSIS — I152 Hypertension secondary to endocrine disorders: Secondary | ICD-10-CM

## 2018-12-03 MED ORDER — NITROFURANTOIN MONOHYD MACRO 100 MG PO CAPS: 100.0000 mg | ORAL_CAPSULE | Freq: Every day | ORAL | 6 refills | Status: AC

## 2018-12-03 MED ORDER — HYDRALAZINE HCL 50 MG PO TABS: 50.0000 mg | ORAL_TABLET | Freq: Two times a day (BID) | ORAL | 6 refills | Status: DC

## 2018-12-03 NOTE — Patient Instructions (Addendum)
Monitor BP daily Record time medications are taken and what time blood pressures are taken.  Keep follow up with cardiology.  If you have any dizziness, confusion, weakness, slurred speech, or syncope, you need to go to the emergency room.     Hypertension, Adult Hypertension is another name for high blood pressure. High blood pressure forces your heart to work harder to pump blood. This can cause problems over time. There are two numbers in a blood pressure reading. There is a top number (systolic) over a bottom number (diastolic). It is best to have a blood pressure that is below 120/80. Healthy choices can help lower your blood pressure, or you may need medicine to help lower it. What are the causes? The cause of this condition is not known. Some conditions may be related to high blood pressure. What increases the risk?  Smoking.  Having type 2 diabetes mellitus, high cholesterol, or both.  Not getting enough exercise or physical activity.  Being overweight.  Having too much fat, sugar, calories, or salt (sodium) in your diet.  Drinking too much alcohol.  Having long-term (chronic) kidney disease.  Having a family history of high blood pressure.  Age. Risk increases with age.  Race. You may be at higher risk if you are African American.  Gender. Men are at higher risk than women before age 73. After age 27, women are at higher risk than men.  Having obstructive sleep apnea.  Stress. What are the signs or symptoms?  High blood pressure may not cause symptoms. Very high blood pressure (hypertensive crisis) may cause: ? Headache. ? Feelings of worry or nervousness (anxiety). ? Shortness of breath. ? Nosebleed. ? A feeling of being sick to your stomach (nausea). ? Throwing up (vomiting). ? Changes in how you see. ? Very bad chest pain. ? Seizures. How is this treated?  This condition is treated by making healthy lifestyle changes, such as: ? Eating healthy foods.  ? Exercising more. ? Drinking less alcohol.  Your health care provider may prescribe medicine if lifestyle changes are not enough to get your blood pressure under control, and if: ? Your top number is above 130. ? Your bottom number is above 80.  Your personal target blood pressure may vary. Follow these instructions at home: Eating and drinking   If told, follow the DASH eating plan. To follow this plan: ? Fill one half of your plate at each meal with fruits and vegetables. ? Fill one fourth of your plate at each meal with whole grains. Whole grains include whole-wheat pasta, brown rice, and whole-grain bread. ? Eat or drink low-fat dairy products, such as skim milk or low-fat yogurt. ? Fill one fourth of your plate at each meal with low-fat (lean) proteins. Low-fat proteins include fish, chicken without skin, eggs, beans, and tofu. ? Avoid fatty meat, cured and processed meat, or chicken with skin. ? Avoid pre-made or processed food.  Eat less than 1,500 mg of salt each day.  Do not drink alcohol if: ? Your doctor tells you not to drink. ? You are pregnant, may be pregnant, or are planning to become pregnant.  If you drink alcohol: ? Limit how much you use to:  0-1 drink a day for women.  0-2 drinks a day for men. ? Be aware of how much alcohol is in your drink. In the [place] one drink equals one 12 oz bottle of beer (355 mL), one 5 oz glass of wine (148 mL), or  one 1 oz glass of hard liquor (44 mL). Lifestyle   Work with your doctor to stay at a healthy weight or to lose weight. Ask your doctor what the best weight is for you.  Get at least 30 minutes of exercise most days of the week. This may include walking, swimming, or biking.  Get at least 30 minutes of exercise that strengthens your muscles (resistance exercise) at least 3 days a week. This may include lifting weights or doing Pilates.  Do not use any products that contain nicotine or tobacco, such as cigarettes,  e-cigarettes, and chewing tobacco. If you need help quitting, ask your doctor.  Check your blood pressure at home as told by your doctor.  Keep all follow-up visits as told by your doctor. This is important. Medicines  Take over-the-counter and prescription medicines only as told by your doctor. Follow directions carefully.  Do not skip doses of blood pressure medicine. The medicine does not work as well if you skip doses. Skipping doses also puts you at risk for problems.  Ask your doctor about side effects or reactions to medicines that you should watch for. Contact a doctor if you:  Think you are having a reaction to the medicine you are taking.  Have headaches that keep coming back (recurring).  Feel dizzy.  Have swelling in your ankles.  Have trouble with your vision. Get help right away if you:  Get a very bad headache.  Start to feel mixed up (confused).  Feel weak or numb.  Feel faint.  Have very bad pain in your: ? Chest. ? Belly (abdomen).  Throw up more than once.  Have trouble breathing. Summary  Hypertension is another name for high blood pressure.  High blood pressure forces your heart to work harder to pump blood.  For most people, a normal blood pressure is less than 120/80.  Making healthy choices can help lower blood pressure. If your blood pressure does not get lower with healthy choices, you may need to take medicine. This information is not intended to replace advice given to you by your health care provider. Make sure you discuss any questions you have with your health care provider. Document Released: 10/08/2007 Document Revised: 12/30/2017 Document Reviewed: 12/30/2017 Elsevier Patient Education  2020 [employer].

## 2018-12-03 NOTE — Progress Notes (Addendum)
Patient ID: [name], female    DOB: 1926-01-16, 85 y.o.   MRN: [id number]  Chief Complaint:  Hypertension (elevated )   HPI: [name] is a 85 y.o. female presenting on 12/03/2018 for Hypertension (elevated )   1. Hypertension associated with diabetes ([place])   2. Resistant hypertension  Complaint with meds - No, not taking lasix due to incontinence. Checking BP at home ranging 160/100 this morning Exercising Regularly - No Watching Salt intake - No Pertinent ROS:  Headache - No Chest pain - No Dyspnea - No Palpitations - No LE edema - No They report good compliance with medications and can restate their regimen by memory. No medication side effects.  BP Readings from Last 3 Encounters:  12/03/18 (!) 174/83  11/03/18 135/69  10/21/18 (!) 179/85   She is not taking the lasix as prescribed. States this caused urinary incontinence so she stopped it. She states she is taking the Hydralazine twice daily as prescribed. States she has not taken her morning dose today.    3. History of recurrent UTIs  Pt has recurrent UTIs. Taking macrobid 100 mg daily as preventative. Asymptomatic at present.     PMH: Smoking status noted  Review of Systems   ROS per HPI    BP (!) 174/83   Pulse 67   Temp 98.4 F (36.9 C)   Ht 4\' 10"  (1.473 m)   Wt 153 lb (69.4 kg)   BMI 31.98 kg/m   Gen: NAD, alert, cooperative with exam HEENT: NCAT, EOMI, PERRL CV: IRRR, good S1/S2, no murmur, 1+ nonpitting bilateral edema.  Resp: CTABL, no wheezes, non-labored Abd: SNTND, BS present, no guarding or organomegaly Ext: No edema, warm Neuro: Alert and oriented, No gross deficits    Assessment and Plan:  [name] was seen today for hypertension.  Diagnoses and all orders for this visit:  Hypertension associated with diabetes ([place]) Resistant hypertension Consulted with Dr. Harrell Gave, cardiology, concerning continued elevated blood pressure. Will have pt to monitor BP readings,  time of readings, and time of medications. Will refer to home health for medication management and BP checks. DASH diet and importance of medication compliance discussed in detail. Pt has not taken medications this morning. Pt aware to take as soon as she gets home and this afternoon. No red flags concerning for acute cardiovascular or neurovascular events. Follow up in 2 weeks. Keep appointment with cardiology.  -     Ambulatory referral to [facility] -     hydrALAZINE (APRESOLINE) 50 MG tablet; Take 1 tablet (50 mg total) by mouth 2 (two) times daily.  History of recurrent UTIs Asymptomatic. Will continue preventative therapy.  -     nitrofurantoin, macrocrystal-monohydrate, (MACROBID) 100 MG capsule; Take 1 capsule (100 mg total) by mouth at bedtime. 1 po BId     Orders Placed This Encounter  Procedures  . Ambulatory referral to Home Health    Referral Priority:   Routine    Referral Type:   Home Health Care    Referral Reason:   Specialty Services Required    Requested Specialty:   [facility]    Number of Visits Requested:   1    Meds ordered this encounter  Medications  . nitrofurantoin, macrocrystal-monohydrate, (MACROBID) 100 MG capsule    Sig: Take 1 capsule (100 mg total) by mouth at bedtime. 1 po BId    Dispense:  30 capsule    Refill:  6  Order Specific Question:   Supervising Provider    Answer:   Claretta Fraise [phone]  . hydrALAZINE (APRESOLINE) 50 MG tablet    Sig: Take 1 tablet (50 mg total) by mouth 2 (two) times daily.    Dispense:  60 tablet    Refill:  6    Order Specific Question:   Supervising Provider    Answer:   Claretta Fraise [phone]     Educational handout given for Hypertension, DASH diet. Pt to keep upcoming appointment with cardiology. Pt aware of symptoms that require emergent evaluation and treatment.   The above assessment and management plan was discussed with the patient. The patient verbalized understanding of and has agreed  to the management plan. Patient is aware to call the clinic if symptoms persist or worsen. Patient is aware when to return to the clinic for a follow-up visit. Patient educated on when it is appropriate to go to the emergency department.   Return in about 2 weeks (around 12/17/2018), or if symptoms worsen or fail to improve, for HTN.  Monia Pouch, FNP-C [facility] [phone]

## 2018-12-06 ENCOUNTER — Other Ambulatory Visit: Payer: Self-pay | Admitting: Family Medicine

## 2018-12-09 ENCOUNTER — Ambulatory Visit: Payer: Self-pay | Admitting: Licensed Clinical Social Worker

## 2018-12-09 DIAGNOSIS — I4891 Unspecified atrial fibrillation: Secondary | ICD-10-CM

## 2018-12-09 DIAGNOSIS — M159 Polyosteoarthritis, unspecified: Secondary | ICD-10-CM

## 2018-12-09 DIAGNOSIS — Z604 Social exclusion and rejection: Secondary | ICD-10-CM

## 2018-12-09 DIAGNOSIS — K219 Gastro-esophageal reflux disease without esophagitis: Secondary | ICD-10-CM

## 2018-12-09 DIAGNOSIS — Z853 Personal history of malignant neoplasm of breast: Secondary | ICD-10-CM

## 2018-12-09 DIAGNOSIS — E1140 Type 2 diabetes mellitus with diabetic neuropathy, unspecified: Secondary | ICD-10-CM

## 2018-12-09 DIAGNOSIS — I5033 Acute on chronic diastolic (congestive) heart failure: Secondary | ICD-10-CM

## 2018-12-09 DIAGNOSIS — E1159 Type 2 diabetes mellitus with other circulatory complications: Secondary | ICD-10-CM

## 2018-12-09 DIAGNOSIS — I152 Hypertension secondary to endocrine disorders: Secondary | ICD-10-CM

## 2018-12-09 DIAGNOSIS — J8410 Pulmonary fibrosis, unspecified: Secondary | ICD-10-CM

## 2018-12-09 NOTE — Chronic Care Management (AMB) (Signed)
  Care Management Note   [name] is a 85 y.o. year old female who is a primary care patient of Joan Gouty, [name]. The CM team was consulted for assistance with chronic disease management and care coordination.   I reached out to [name] by phone today.     Review of patient status, including review of consultants reports, relevant laboratory and other test results, and collaboration with appropriate care team members and the patient's provider was performed as part of comprehensive patient evaluation and provision of chronic care management services.   Social Determinants of Health: risk of stress; risk of social isolation    Clinical Support from 11/10/2018 in [place]  PHQ-9 Total Score  3      . "I wish I could get out of the house and go to the grocery store" (pt-stated)     Current Barriers:   Lacks caregiver support.    Social isolation due to [place] Stay at [facility], In Orders  Nurse Case Manager Clinical Goal(s):  [name] Kitchen Over the next 30 days, patient will work with CM clinical social worker to address psychosocial issues related to social isolation.   Interventions:  . Advised patient to talk with friends/family on the phone often for social/emotional support.  . Suggested that patient make a quick trip to the grocery store once or twice a week.  o She has an N95 mask. I advised her to wear it. o Only go in the store if it is not crowded o Limit distance from other people to 6 feet or more o Wash hands often and thoroughly o Talked with client about calling in grocery order to local [place] store and then just driving to store to pay for ordered food and to pick it up . Collaborated with RNCM related to client needs.  . Patient encouraged to reach out with any medical or psychosocial needs . Provided counseling support for client  Patient Self Care Activities:  . Self administers medications as prescribed . Attends all scheduled  provider appointments . Calls pharmacy for medication refills . Attends church or other social activities . Performs ADL's independently . Performs IADL's independently . Calls provider office for new concerns or questions  Initial goal documentation     Client said she had appointment with Darla Lesches [name] last Friday.  She has her prescribed medications and is taking medications as prescribed. Client has appointment with cardiologist, Dr. Neita Garnet, on 12/29/2018. Client said she is doing ADLs independently. She is cooking meals as needed  She drives her car locally to complete errands. She said she has been at home for many days. due to concerns related to [facility] Virus.  Her son helps client procure needed food items. Client has a return appointment with Darla Lesches [name] in September, 2020  . Client said she is having difficulty sleeping. [name] encouraged client to call [name] as needed to discuss social work needs of client.   Follow Up Plan:  [name] to call client in next 3 weeks to talk with client about psychosocial needs of client at that time.  Norva Riffle.Shaylea Ucci MSW, [name] Licensed Clinical Social Worker [facility] [phone]

## 2018-12-09 NOTE — Patient Instructions (Signed)
Licensed Clinical Social Worker Visit Information  Goals we discussed today:  Goals        . "I wish I could get out of the house and go to the grocery store" (pt-stated)     Current Barriers:   Lacks caregiver support.    Social isolation due to [place] Stay at [facility] Orders  Nurse Case Manager Clinical Goal(s):  [place] Kitchen Over the next 30 days, patient will work with CM clinical social worker to address psychosocial issues related to social isolation.   Interventions:  . Advised patient to talk with friends/family on the phone often for social/emotional support.  . Suggested that patient make a quick trip to the grocery store once or twice a week.  o She has an N95 mask. I advised her to wear it. o Only go in the store if it is not crowded o Limit distance from other people to 6 feet or more o Wash hands often and thoroughly o Talked with client about calling in grocery order to local [place] store and then just driving to store to pay for ordered food and to pick it up  . Collaborated with RNCM related to client needs.  . Patient encouraged to reach out with any medical or psychosocial needs . Provided counseling support for client . Talked with client about home health services for client  Patient Self Care Activities:  . Self administers medications as prescribed . Attends all scheduled provider appointments . Calls pharmacy for medication refills . Attends church or other social activities . Performs ADL's independently . Performs IADL's independently . Calls provider office for new concerns or questions  Initial goal documentation      .         Materials Provided: No  Follow Up Plan:  [name] to call client in next 3 weeks to talk with client about psychosocial needs of client  The patient verbalized understanding of instructions provided today and declined a print copy of patient instruction materials.   Norva Riffle.Trevar Boehringer MSW, [name] Licensed Clinical Social  Worker [facility] [phone]

## 2018-12-10 DIAGNOSIS — L03031 Cellulitis of right toe: Secondary | ICD-10-CM | POA: Diagnosis not present

## 2018-12-14 ENCOUNTER — Telehealth: Payer: Self-pay | Admitting: *Deleted

## 2018-12-14 NOTE — Telephone Encounter (Signed)
IFYI ncoming call from pt Last 4 bp readings 146/74   P 62 152/76   P 65 157/75   P 64 165/78   P 63

## 2018-12-15 ENCOUNTER — Other Ambulatory Visit: Payer: Self-pay | Admitting: Family Medicine

## 2018-12-15 NOTE — Telephone Encounter (Signed)
Moving in the right direction...

## 2018-12-28 NOTE — Progress Notes (Signed)
Cardiology Office Note   Date:  12/29/2018   ID:  [name], DOB Nov 07, 1925, MRN [id number]  PCP:  Baruch Gouty, FNP  Cardiologist:   Nohelani Benning [place], MD   Chief Complaint  Patient presents with   Atrial Fibrillation   Hypertension      History of Present Illness: [name] is a 85 y.o. female who is seen for follow up of SOB. She has a history of DM, HLD, HTN and prior CVA ( right pontine/thalamic in 2007). She has a history of Afib diagnosed in August 2019 and is on chronic Eliquis. Prior Echo in August showed normal EF with mild MR, moderate TR. Estimated RV systolic pressure 58 mmHg. Marked biatrial enlargement. She has moderate carotid arterial disease 40-59% on the right and < 39% on the left.   She was admitted with acute respiratory failure in August 2019 . This was felt to be multifactorial with pulmonary fibrosis and acute diastolic CHF. CT chest showed no PE. Mild coronary calcification, some emphysema changes and atelectasis. She was managed with steroids, inhalers and diuretics.  She was DC on diltiazem but this was later stopped due to rash.   On follow up today she is doing OK. No chest pain or dyspnea. Has a lot of pain related to arthritis. BP has been high. Was 178/97 this am and she states it is often higher. She is tolerating hydralazine OK.    Past Medical History:  Diagnosis Date   Arthritis    Cancer ([place])    breast   Cataract    Diabetes mellitus without complication ([place])    Frequent UTI    Hyperlipidemia    Hypertension    Neck pain    Scoliosis    Stroke [facility])     Past Surgical History:  Procedure Laterality Date   APPENDECTOMY     BREAST LUMPECTOMY Left    CHOLECYSTECTOMY     JOINT REPLACEMENT     bilat knee    REPLACEMENT TOTAL KNEE BILATERAL Bilateral    TONSILLECTOMY       Current Outpatient Medications  Medication Sig Dispense Refill   apixaban (ELIQUIS) 5 MG TABS tablet Take 1 tablet (5 mg  total) by mouth 2 (two) times daily. 60 tablet 2   Cholecalciferol (VITAMIN D3) 5000 units CAPS Take 2,000 Units by mouth daily.      D-Mannose 500 MG CAPS Take 500 mg by mouth daily.     glucose blood (ONE TOUCH ULTRA TEST) test strip USE TO check blood sugars twice daily 100 each 12   Insulin Glargine (LANTUS SOLOSTAR) 100 UNIT/ML Solostar Pen INJECT 50 UNITS ONCE DAILY AT 10PM 15 mL 1   Insulin Pen Needle 31G X 8 MM MISC Use once daily with lantus solostar 100 each 6   Magnesium 250 MG TABS Take 250 mg by mouth daily.     metFORMIN (GLUCOPHAGE) 500 MG tablet TAKE (1) TABLET DAILY WITH BREAKFAST. 90 tablet 0   nabumetone (RELAFEN) 500 MG tablet Take 1 tablet (500 mg total) by mouth 2 (two) times daily. For muscleand joint pain 60 tablet 2   nitrofurantoin, macrocrystal-monohydrate, (MACROBID) 100 MG capsule Take 1 capsule (100 mg total) by mouth at bedtime. 1 po BId 30 capsule 6   omeprazole (PRILOSEC) 20 MG capsule TAKE  (1)  CAPSULE  TWICE DAILY (TAKE ON AN EMPTY STOMACH AT LEAST 30MIN- UTES BEFORE MEALS). 180 capsule 1   ondansetron (ZOFRAN) 4 MG tablet TAKE  1 TABLET EVERY 8 HOURS AS NEEDED FOR NAUSEA & VOMITING 20 tablet 0   SUPER B COMPLEX/C PO Take 1 tablet by mouth daily.     hydrALAZINE (APRESOLINE) 100 MG tablet Take 1 tablet (100 mg total) by mouth 2 (two) times daily. 180 tablet 3   rOPINIRole (REQUIP) 0.25 MG tablet Take 1 tablet (0.25 mg total) by mouth 3 (three) times daily for 30 days. Take one tablet in the morning, one tablet at noon and two tablets at bedtime. 120 tablet 3   No current facility-administered medications for this visit.     Allergies:   Diltiazem hcl and Ace inhibitors    Social History:  The patient  reports that she has never smoked. She has never used smokeless tobacco. She reports that she does not drink alcohol or use drugs.   Family History:  The patient's family history includes Alcohol abuse in her brother; Arthritis in her mother;  Cancer in her brother and mother; Heart disease in her father.    ROS:  Please see the history of present illness.   Otherwise, review of systems are positive for none.   All other systems are reviewed and negative.    PHYSICAL EXAM: VS:  BP (!) 161/78    Pulse 85    Temp 97.7 F (36.5 C)    Ht 4\' 10"  (1.473 m)    Wt 154 lb 9.6 oz (70.1 kg)    SpO2 96%    BMI 32.31 kg/m  , BMI Body mass index is 32.31 kg/m. GEN: Elderly WF, Well nourished, well developed, in no acute distress  HEENT: normal  Neck: no JVD, carotid bruits, or masses Cardiac: IRRR; no murmurs, rubs, or gallops, 1+ ankle edema  Respiratory:  Diffuse crackles. GI: soft, nontender, nondistended, + BS MS: no deformity or atrophy  Skin: warm and dry, no rash Neuro:  Strength and sensation are intact Psych: euthymic mood, full affect   EKG:  EKG is not ordered today.    Recent Labs: 01/29/2018: BNP 192.2 10/14/2018: ALT 24; BUN 20; Creatinine, Ser 0.80; Hemoglobin 13.5; Platelets 251; Potassium 4.6; Sodium 145; TSH 1.630    Lipid Panel    Component Value Date/Time   CHOL 222 (H) 10/14/2018 1433   CHOL 176 11/09/2012 1604   TRIG 209 (H) 10/14/2018 1433   TRIG 189 (H) 07/14/2013 1525   TRIG 242 (H) 11/09/2012 1604   HDL 38 (L) 10/14/2018 1433   HDL 52 07/14/2013 1525   HDL 44 11/09/2012 1604   CHOLHDL 5.8 (H) 10/14/2018 1433   LDLCALC 142 (H) 10/14/2018 1433   LDLCALC 102 (H) 07/14/2013 1525   LDLCALC 84 11/09/2012 1604      Wt Readings from Last 3 Encounters:  12/29/18 154 lb 9.6 oz (70.1 kg)  12/03/18 153 lb (69.4 kg)  11/03/18 153 lb (69.4 kg)      Other studies Reviewed: Additional studies/ records that were reviewed today include:   Echo 12/07/17: Study Conclusions  - Left ventricle: The cavity size was normal. Wall thickness was   increased increased in a pattern of mild to moderate LVH.   Systolic function was normal. The estimated ejection fraction was   in the range of 60% to 65%. Wall  motion was normal; there were no   regional wall motion abnormalities. - Aortic valve: Mildly calcified annulus. Trileaflet; mildly   thickened leaflets. Valve area (VTI): 2.15 cm^2. Valve area   (Vmax): 2.15 cm^2. - Mitral valve: Mildly calcified annulus.  Normal thickness leaflets   . There was mild regurgitation. - Left atrium: The atrium was severely dilated. - Right ventricle: Systolic function was mildly reduced. - Right atrium: The atrium was severely dilated. - Atrial septum: No defect or patent foramen ovale was identified. - Tricuspid valve: There was moderate regurgitation. - Pulmonary arteries: Systolic pressure was moderately increased.   PA peak pressure: 58 mm Hg (S).   ASSESSMENT AND PLAN:  1.  Atrial fibrillation probably permanent. Likely of longstanding duration. Appropriately on anticoagulation with Eliquis. HR is normal on no medication. 2. Pulmonary HTN secondary to chronic pulmonary fibrosis +/- diastolic CHF.  3. Chronic diastolic CHF secondary to age and uncontrolled HTN. Appears well compensated.  Recommend sodium restriction. Elevate feet when possible. She does not need lasix currently.  4. HTN poorly controlled. Rash on diltiazem. Cough on ACEi. Was tried on chlorthalidone but this didn't agree with her. Now on hydralazine. Will increase dose to 100 mg bid. May take extra 50 mg as needed for BP > [id number] systolic.  5. DM on metformin 6. HLD   Current medicines are reviewed at length with the patient today.  The patient does not have concerns regarding medicines.  No orders of the defined types were placed in this encounter.    Disposition:   FU with me  in 6 months  Signed, Ayani Ospina [place], MD  12/29/2018 1:42 PM    [facility] [address], [place], [place], [address] Phone [phone], Fax [phone]

## 2018-12-29 ENCOUNTER — Encounter: Payer: Self-pay | Admitting: Cardiology

## 2018-12-29 ENCOUNTER — Other Ambulatory Visit: Payer: Self-pay

## 2018-12-29 ENCOUNTER — Ambulatory Visit (INDEPENDENT_AMBULATORY_CARE_PROVIDER_SITE_OTHER): Payer: PPO | Admitting: Cardiology

## 2018-12-29 VITALS — BP 161/78 | HR 85 | Temp 97.7°F | Ht <= 58 in | Wt 154.6 lb

## 2018-12-29 DIAGNOSIS — I10 Essential (primary) hypertension: Secondary | ICD-10-CM

## 2018-12-29 DIAGNOSIS — I272 Pulmonary hypertension, unspecified: Secondary | ICD-10-CM

## 2018-12-29 DIAGNOSIS — I5032 Chronic diastolic (congestive) heart failure: Secondary | ICD-10-CM

## 2018-12-29 DIAGNOSIS — I4821 Permanent atrial fibrillation: Secondary | ICD-10-CM | POA: Diagnosis not present

## 2018-12-29 MED ORDER — HYDRALAZINE HCL 100 MG PO TABS: 100.0000 mg | ORAL_TABLET | Freq: Two times a day (BID) | ORAL | 3 refills | Status: DC

## 2018-12-29 NOTE — Patient Instructions (Signed)
Increase hydralazine to 100 mg twice a day  You may take an extra 50 mg as needed for BP reading over 180.  Follow up in 6 months

## 2018-12-30 ENCOUNTER — Telehealth: Payer: Self-pay | Admitting: Cardiology

## 2018-12-30 ENCOUNTER — Ambulatory Visit (INDEPENDENT_AMBULATORY_CARE_PROVIDER_SITE_OTHER): Payer: PPO | Admitting: Licensed Clinical Social Worker

## 2018-12-30 DIAGNOSIS — Z794 Long term (current) use of insulin: Secondary | ICD-10-CM

## 2018-12-30 DIAGNOSIS — E1159 Type 2 diabetes mellitus with other circulatory complications: Secondary | ICD-10-CM

## 2018-12-30 DIAGNOSIS — I5033 Acute on chronic diastolic (congestive) heart failure: Secondary | ICD-10-CM | POA: Diagnosis not present

## 2018-12-30 DIAGNOSIS — I4891 Unspecified atrial fibrillation: Secondary | ICD-10-CM | POA: Diagnosis not present

## 2018-12-30 DIAGNOSIS — M150 Primary generalized (osteo)arthritis: Secondary | ICD-10-CM

## 2018-12-30 DIAGNOSIS — Z853 Personal history of malignant neoplasm of breast: Secondary | ICD-10-CM | POA: Diagnosis not present

## 2018-12-30 DIAGNOSIS — Z604 Social exclusion and rejection: Secondary | ICD-10-CM

## 2018-12-30 DIAGNOSIS — E1140 Type 2 diabetes mellitus with diabetic neuropathy, unspecified: Secondary | ICD-10-CM

## 2018-12-30 DIAGNOSIS — K219 Gastro-esophageal reflux disease without esophagitis: Secondary | ICD-10-CM

## 2018-12-30 DIAGNOSIS — M159 Polyosteoarthritis, unspecified: Secondary | ICD-10-CM

## 2018-12-30 DIAGNOSIS — I10 Essential (primary) hypertension: Secondary | ICD-10-CM

## 2018-12-30 DIAGNOSIS — J8410 Pulmonary fibrosis, unspecified: Secondary | ICD-10-CM

## 2018-12-30 NOTE — Patient Instructions (Signed)
Licensed Clinical Social Worker Visit Information  Goals we discussed today:  Goals            . "I wish I could get out of the house and go to the grocery store" (pt-stated)     Current Barriers:   Lacks caregiver support.    Social isolation due to [place] Stay at [facility] Orders  Nurse Case Manager Clinical Goal(s):  Marland Kitchen Over the next 30 days, patient will work with CM clinical social worker to address psychosocial issues related to social isolation.   Interventions:  . Advised patient to talk with friends/family on the phone often for social/emotional support.  . Suggested that patient make a quick trip to the grocery store once or twice a week.  o She has an N95 mask. I advised her to wear it. o Only go in the store if it is not crowded o Limit distance from other people to 6 feet or more o Wash hands often and thoroughly o Talked with client about calling in grocery order to local [place] store and then just driving to store to pay for ordered food and to pick it up . Collaborated with RNCM related to client needs.  . Patient encouraged to reach out with any medical or psychosocial needs . Provided counseling support for client  Patient Self Care Activities:  . Self administers medications as prescribed . Attends all scheduled provider appointments . Calls pharmacy for medication refills . Attends church or other social activities . Performs ADL's independently . Performs IADL's independently . Calls provider office for new concerns or questions  Initial goal documentation      . "I would like to have some help with chores around my home" (pt-stated)     Current Barriers:  Marland Kitchen Knowledge Deficits related to resource options available to her . Lacks caregiver support. Son is available by phone but does not live locally.  . 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   Nurse Case Manager Clinical Goal(s):   Marland Kitchen Over  the next 30 days, patient will communicate with [name] to discuss services of assistance that may help client in her home..   Interventions:   Offered information on [place] [phone]  Patient Self Care Activities:  . Self administers medications as prescribed . Attends all scheduled provider appointments . Calls pharmacy for medication refills . Attends church or other social activities . Performs ADL's independently . Performs IADL's independently . Calls provider office for new concerns or questions  Please see past updates related to this goal by clicking on the "Past Updates" button in the selected goal          Materials Provided: No  Follow Up Plan: [name] to call client in the next 3 weeks to talk with client about psychosocial needs of client and to talk with client about social work needs of client  The patient verbalized understanding of instructions provided today and declined a print copy of patient instruction materials.   Norva Riffle.Shermaine Brigham MSW, [name] Licensed Clinical Social Worker [facility] [phone]

## 2018-12-30 NOTE — Chronic Care Management (AMB) (Signed)
Care Management Note   [name] is a 85 y.o. year old female who is a primary care patient of Baruch Gouty, FNP. The CM team was consulted for assistance with chronic disease management and care coordination.   I reached out to [name] by phone today.   Review of patient status, including review of consultants reports, relevant laboratory and other test results, and collaboration with appropriate care team members and the patient's provider was performed as part of comprehensive patient evaluation and provision of chronic care management services.   Social Determinants of Health: risk for social isolation; risk for stress    Clinical Support from 11/10/2018 in [place]  PHQ-9 Total Score  3     Medications    apixaban (ELIQUIS) 5 MG TABS tablet    Cholecalciferol (VITAMIN D3) 5000 units CAPS    D-Mannose 500 MG CAPS    glucose blood (ONE TOUCH ULTRA TEST) test strip    hydrALAZINE (APRESOLINE) 100 MG tablet    Insulin Glargine (LANTUS SOLOSTAR) 100 UNIT/ML Solostar Pen    Insulin Pen Needle 31G X 8 MM MISC    Magnesium 250 MG TABS    metFORMIN (GLUCOPHAGE) 500 MG tablet    nabumetone (RELAFEN) 500 MG tablet    nitrofurantoin, macrocrystal-monohydrate, (MACROBID) 100 MG capsule    omeprazole (PRILOSEC) 20 MG capsule    ondansetron (ZOFRAN) 4 MG tablet    rOPINIRole (REQUIP) 0.25 MG tablet(Expired)    SUPER B COMPLEX/C PO     Goals    . "I wish I could get out of the house and go to the grocery store" (pt-stated)     Current Barriers:   Lacks caregiver support.    Social isolation due to [place] Stay at [facility] Orders  Nurse Case Manager Clinical Goal(s):  Marland Kitchen Over the next 30 days, patient will work with CM clinical social worker to address psychosocial issues related to social isolation.   Interventions:  . Advised patient to talk with friends/family on the phone often for social/emotional support.  . Suggested that patient make a  quick trip to the grocery store once or twice a week.  o She has an N95 mask. I advised her to wear it. o Only go in the store if it is not crowded o Limit distance from other people to 6 feet or more o Wash hands often and thoroughly o Talked with client about calling in grocery order to local [place] store and then just driving to store to pay for ordered food and to pick it up . Collaborated with RNCM related to client needs.  . Patient encouraged to reach out with any medical or psychosocial needs client  Patient Self Care Activities:  . Self administers medications as prescribed . Attends all scheduled provider appointments . Calls pharmacy for medication refills . Attends church or other social activities . Performs ADL's independently . Performs IADL's independently . Calls provider office for new concerns or questions  Initial goal documentation      . "I would like to have some help with chores around my home" (pt-stated)     Current Barriers:  Marland Kitchen Knowledge Deficits related to resource options available to her . Lacks caregiver support. Son is available by phone but does not live locally.  . 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   Nurse Case Manager Clinical Goal(s):   Marland Kitchen Over the next 30  days, patient will communicate with [name] to discuss services of assistance that may help client in her home..   Interventions:   Offered information on [place] [phone]  Patient Self Care Activities:  . Self administers medications as prescribed . Attends all scheduled provider appointments . Calls pharmacy for medication refills . Attends church or other social activities . Performs ADL's independently . Performs IADL's independently . Calls provider office for new concerns or questions  Please see past updates related to this goal by clicking on the "Past Updates" button in the selected goal         Client said she saw Dr. Neita Garnet, cardiologist, for appointment yesterday. She said her blood pressure today was 182/96.She said she had a heart rate of 68. [name] conveyed this information to Joan Michaelis, LPN at [facility] today. [name] said she would forward that information to [name] FN-P at [facility] today. [name] also said she would call client later today to discuss client nursing needs. Client  has her prescribed medications and is taking medications as prescribed.  Client said she is doing ADLs independently. She is cooking meals as needed  She said her son helps client with the transport needs of client She said she has been at home for many days. due to concerns related to [facility] Virus.  Her son helps client procure needed food items. Client has a return appointment with Joan Lesches FNP in September, 2020  . Client said she is having difficulty sleeping. She said she was not sleeping well at night.  [name] encouraged client to call [name] as needed to discuss social work needs of client. She said she has reduced appetite.  [name] talked with client about in home support program with [facility] in [place], [place]  Follow Up Plan: [name] to call client in next 3 weeks to talk with client about psychosocial needs of client and to talk with client about social work needs of client  Norva Riffle.Javien Tesch MSW, [name] Licensed Clinical Social Worker [facility] [phone]

## 2018-12-30 NOTE — Telephone Encounter (Signed)
Patient can change dose to 75mg  (50mg  tabs take 1 and 1/2 tablets) for 2 weeks, then increase to 100mg  if able to tolerate.

## 2018-12-30 NOTE — Telephone Encounter (Signed)
Patient took the increase dose of the medication when she got home from the office, and since she took it throughout the night she was very weak feeling, and not feeling well. She states her BP this morning was 182/96, she states she always has dizziness but when she took the increase last night it was more profound. She would like to know if there is another way to take the increase as she feels it is to much too soon.   please advise, thank you!

## 2018-12-30 NOTE — Telephone Encounter (Signed)
Called patient and LVM advising of note from PharmD.  Advised patient to call back if questions.

## 2018-12-30 NOTE — Telephone Encounter (Signed)
° ° °  Pt c/o medication issue:  1. Name of Medication: hydrALAZINE (APRESOLINE) 100 MG tablet [3699]   2. How are you currently taking this medication (dosage and times per day)? As writen 3. Are you having a reaction (difficulty breathing--STAT)? No  4. What is your medication issue? States increase in medication makes her feel too weak

## 2019-01-07 ENCOUNTER — Ambulatory Visit: Payer: PPO | Admitting: Family Medicine

## 2019-01-12 ENCOUNTER — Other Ambulatory Visit: Payer: Self-pay

## 2019-01-12 ENCOUNTER — Ambulatory Visit (INDEPENDENT_AMBULATORY_CARE_PROVIDER_SITE_OTHER): Payer: PPO

## 2019-01-12 DIAGNOSIS — Z794 Long term (current) use of insulin: Secondary | ICD-10-CM

## 2019-01-12 DIAGNOSIS — R5381 Other malaise: Secondary | ICD-10-CM

## 2019-01-12 DIAGNOSIS — Z9181 History of falling: Secondary | ICD-10-CM

## 2019-01-12 DIAGNOSIS — M5136 Other intervertebral disc degeneration, lumbar region: Secondary | ICD-10-CM

## 2019-01-12 DIAGNOSIS — E1159 Type 2 diabetes mellitus with other circulatory complications: Secondary | ICD-10-CM

## 2019-01-12 DIAGNOSIS — Z87440 Personal history of urinary (tract) infections: Secondary | ICD-10-CM

## 2019-01-12 DIAGNOSIS — I152 Hypertension secondary to endocrine disorders: Secondary | ICD-10-CM | POA: Diagnosis not present

## 2019-01-12 DIAGNOSIS — M150 Primary generalized (osteo)arthritis: Secondary | ICD-10-CM

## 2019-01-12 DIAGNOSIS — Z7901 Long term (current) use of anticoagulants: Secondary | ICD-10-CM

## 2019-01-12 DIAGNOSIS — M810 Age-related osteoporosis without current pathological fracture: Secondary | ICD-10-CM

## 2019-01-12 DIAGNOSIS — Z8673 Personal history of transient ischemic attack (TIA), and cerebral infarction without residual deficits: Secondary | ICD-10-CM

## 2019-01-12 DIAGNOSIS — M542 Cervicalgia: Secondary | ICD-10-CM | POA: Diagnosis not present

## 2019-01-12 DIAGNOSIS — E785 Hyperlipidemia, unspecified: Secondary | ICD-10-CM | POA: Diagnosis not present

## 2019-01-18 DIAGNOSIS — E1142 Type 2 diabetes mellitus with diabetic polyneuropathy: Secondary | ICD-10-CM | POA: Diagnosis not present

## 2019-01-18 DIAGNOSIS — B351 Tinea unguium: Secondary | ICD-10-CM | POA: Diagnosis not present

## 2019-01-18 DIAGNOSIS — L84 Corns and callosities: Secondary | ICD-10-CM | POA: Diagnosis not present

## 2019-01-18 DIAGNOSIS — M79676 Pain in unspecified toe(s): Secondary | ICD-10-CM | POA: Diagnosis not present

## 2019-01-21 ENCOUNTER — Ambulatory Visit (INDEPENDENT_AMBULATORY_CARE_PROVIDER_SITE_OTHER): Payer: PPO | Admitting: Licensed Clinical Social Worker

## 2019-01-21 DIAGNOSIS — M159 Polyosteoarthritis, unspecified: Secondary | ICD-10-CM

## 2019-01-21 DIAGNOSIS — I5033 Acute on chronic diastolic (congestive) heart failure: Secondary | ICD-10-CM | POA: Diagnosis not present

## 2019-01-21 DIAGNOSIS — I10 Essential (primary) hypertension: Secondary | ICD-10-CM | POA: Diagnosis not present

## 2019-01-21 DIAGNOSIS — Z794 Long term (current) use of insulin: Secondary | ICD-10-CM | POA: Diagnosis not present

## 2019-01-21 DIAGNOSIS — E1159 Type 2 diabetes mellitus with other circulatory complications: Secondary | ICD-10-CM

## 2019-01-21 DIAGNOSIS — M150 Primary generalized (osteo)arthritis: Secondary | ICD-10-CM

## 2019-01-21 DIAGNOSIS — K219 Gastro-esophageal reflux disease without esophagitis: Secondary | ICD-10-CM

## 2019-01-21 DIAGNOSIS — Z853 Personal history of malignant neoplasm of breast: Secondary | ICD-10-CM | POA: Diagnosis not present

## 2019-01-21 DIAGNOSIS — Z604 Social exclusion and rejection: Secondary | ICD-10-CM

## 2019-01-21 DIAGNOSIS — I4891 Unspecified atrial fibrillation: Secondary | ICD-10-CM | POA: Diagnosis not present

## 2019-01-21 DIAGNOSIS — I152 Hypertension secondary to endocrine disorders: Secondary | ICD-10-CM

## 2019-01-21 DIAGNOSIS — E1140 Type 2 diabetes mellitus with diabetic neuropathy, unspecified: Secondary | ICD-10-CM | POA: Diagnosis not present

## 2019-01-21 DIAGNOSIS — J8410 Pulmonary fibrosis, unspecified: Secondary | ICD-10-CM

## 2019-01-21 NOTE — Chronic Care Management (AMB) (Signed)
Care Management Note   [name] is a 85 y.o. year old female who is a primary care patient of Baruch Gouty, FNP. The CM team was consulted for assistance with chronic disease management and care coordination.   I reached out to [name] by phone today.     Review of patient status, including review of consultants reports, relevant laboratory and other test results, and collaboration with appropriate care team members and the patient's provider was performed as part of comprehensive patient evaluation and provision of chronic care management services.   Social Determinants of Health: risk of social isolation; risk of stress; risk of physical inactivity    Clinical Support from 11/10/2018 in [place]  PHQ-9 Total Score  3     Medications    apixaban (ELIQUIS) 5 MG TABS tablet    Cholecalciferol (VITAMIN D3) 5000 units CAPS    D-Mannose 500 MG CAPS    glucose blood (ONE TOUCH ULTRA TEST) test strip    hydrALAZINE (APRESOLINE) 100 MG tablet    Insulin Glargine (LANTUS SOLOSTAR) 100 UNIT/ML Solostar Pen    Insulin Pen Needle 31G X 8 MM MISC    Magnesium 250 MG TABS    metFORMIN (GLUCOPHAGE) 500 MG tablet    nabumetone (RELAFEN) 500 MG tablet    omeprazole (PRILOSEC) 20 MG capsule    ondansetron (ZOFRAN) 4 MG tablet    rOPINIRole (REQUIP) 0.25 MG tablet(Expired)    SUPER B COMPLEX/C PO      Goals        . "I wish I could get out of the house and go to the grocery store" (pt-stated)     Current Barriers:   Lacks caregiver support.    Social isolation due to [place] Stay at [facility] Orders  Nurse Case Manager Clinical Goal(s):  Marland Kitchen Over the next 30 days, patient will work with CM clinical social worker to address psychosocial issues related to social isolation.   Interventions:  . Advised patient to talk with friends/family on the phone often for social/emotional support.  . Suggested that patient make a quick trip to the grocery store once  or twice a week.  o She has an N95 mask. I advised her to wear it. o Only go in the store if it is not crowded o Limit distance from other people to 6 feet or more o Wash hands often and thoroughly o Talked with client about calling in grocery order to local [place] store and then just driving to store to pay for ordered food and to pick it up . Previously [name] collaborated with RNCM related to client needs.  . Patient encouraged to reach out with any medical or psychosocial needs . [name] provided counseling support for client . [name] talked with client about social support network for client  Patient Self Care Activities:  . Self administers medications as prescribed . Attends all scheduled provider appointments . Calls pharmacy for medication refills . Attends church or other social activities . Performs ADL's independently . Performs IADL's independently . Calls provider office for new concerns or questions  Initial goal documentation       Client said she is doing ADLs independently. She is cooking meals as needed  She drives her car locally to complete errands. She said she has been at home for many days. due to concerns related to [facility] Virus.  Her son helps client procure needed food items. Client has a return appointment with  Darla Lesches FNP in September, 2020  . Client said she is having difficulty sleeping.  Client spoke of blood pressure issues for client. She said that BP reading on 01/18/19 was 200/94. Heart rate was 67. She said that today her BP reading is 166/98. Heart rate was 76. Client said she has concern because she does not want to have a stroke or have to have others looking after her for care.  [name] encouraged client to call [name] as needed to discuss social work needs of client.   Follow Up Plan:  [name] to call client in next 3 weeks to talk with client about the psychosocial needs of client  Norva Riffle.[name] MSW, [name] Licensed Clinical Social Worker [facility] [phone]

## 2019-01-21 NOTE — Patient Instructions (Signed)
Licensed Clinical Social Worker Visit Information  Goals we discussed today:  Goals        . "I wish I could get out of the house and go to the grocery store" (pt-stated)     Current Barriers:   Lacks caregiver support.    Social isolation due to [place] Stay at [facility] Orders  Nurse Case Manager Clinical Goal(s):  Marland Kitchen Over the next 30 days, patient will work with CM clinical social worker to address psychosocial issues related to social isolation.   Interventions:  . Advised patient to talk with friends/family on the phone often for social/emotional support.  . Suggested that patient make a quick trip to the grocery store once or twice a week.  o She has an N95 mask. I advised her to wear it. o Only go in the store if it is not crowded o Limit distance from other people to 6 feet or more o Wash hands often and thoroughly o Talked with client about calling in grocery order to local [place] store and then just driving to store to pay for ordered food and to pick it up . [name] previously collaborated with RNCM related to client needs.  . Patient encouraged to reach out with any medical or psychosocial needs . [name] provided counseling support for client . Talked with client about social support network for client  Patient Self Care Activities:  . Self administers medications as prescribed . Attends all scheduled provider appointments . Calls pharmacy for medication refills . Attends church or other social activities . Performs ADL's independently . Performs IADL's independently . Calls provider office for new concerns or questions  Initial goal documentation        Materials Provided: No  Follow Up Plan: [name] to call client in next 3 weeks to talk with client about the psychosocial needs of client at that time  The patient verbalized understanding of instructions provided today and declined a print copy of patient instruction materials.   [name].[name] MSW,  [name] Licensed Clinical Social Worker [facility] [phone]

## 2019-01-24 ENCOUNTER — Ambulatory Visit (INDEPENDENT_AMBULATORY_CARE_PROVIDER_SITE_OTHER): Payer: PPO | Admitting: Family Medicine

## 2019-01-24 ENCOUNTER — Telehealth: Payer: Self-pay | Admitting: Family Medicine

## 2019-01-24 ENCOUNTER — Encounter: Payer: Self-pay | Admitting: Family Medicine

## 2019-01-24 DIAGNOSIS — I10 Essential (primary) hypertension: Secondary | ICD-10-CM

## 2019-01-24 DIAGNOSIS — E1140 Type 2 diabetes mellitus with diabetic neuropathy, unspecified: Secondary | ICD-10-CM | POA: Diagnosis not present

## 2019-01-24 DIAGNOSIS — Z794 Long term (current) use of insulin: Secondary | ICD-10-CM | POA: Diagnosis not present

## 2019-01-24 DIAGNOSIS — E1142 Type 2 diabetes mellitus with diabetic polyneuropathy: Secondary | ICD-10-CM | POA: Diagnosis not present

## 2019-01-24 DIAGNOSIS — E1159 Type 2 diabetes mellitus with other circulatory complications: Secondary | ICD-10-CM | POA: Diagnosis not present

## 2019-01-24 DIAGNOSIS — R5381 Other malaise: Secondary | ICD-10-CM | POA: Diagnosis not present

## 2019-01-24 MED ORDER — HYDRALAZINE HCL 50 MG PO TABS: 50.0000 mg | ORAL_TABLET | Freq: Two times a day (BID) | ORAL | 3 refills | Status: DC

## 2019-01-24 MED ORDER — HYDRALAZINE HCL 25 MG PO TABS: 25.0000 mg | ORAL_TABLET | Freq: Two times a day (BID) | ORAL | 3 refills | Status: DC

## 2019-01-24 NOTE — Telephone Encounter (Signed)
Pharmacist aware that patient will need to take hydralazine 25mg  and 50mg  twice daily to total 75mg  twice daily.  Per Monia Pouch, FNP

## 2019-01-24 NOTE — Progress Notes (Signed)
Virtual Visit via telephone Note Due to COVID-19 pandemic this visit was conducted virtually. This visit type was conducted due to national recommendations for restrictions regarding the COVID-19 Pandemic (e.g. social distancing, sheltering in place) in an effort to limit this patient's exposure and mitigate transmission in our community. All issues noted in this document were discussed and addressed.  A physical exam was not performed with this format.   I connected with [name] on 01/24/19 at 1115 by telephone and verified that I am speaking with the correct person using two identifiers. [name] is currently located at home and no one is currently with them during visit. The provider, Monia Pouch, FNP is located in their office at time of visit.  I discussed the limitations, risks, security and privacy concerns of performing an evaluation and management service by telephone and the availability of in person appointments. I also discussed with the patient that there may be a patient responsible charge related to this service. The patient expressed understanding and agreed to proceed.  Subjective:  Patient ID: [name], female    DOB: 1925/08/22, 85 y.o.   MRN: [id number]  Chief Complaint:  Medical Management of Chronic Issues, Hypertension, and Diabetes   HPI: [name] is a 85 y.o. female presenting on 01/24/2019 for Medical Management of Chronic Issues, Hypertension, and Diabetes   Pt reports ongoing elevated blood pressure readings. She has had several medication changes without resolution to elevated blood pressure readings or intolerance to dose changes. Pt states she was seen by cardiology on 12/29/2018. Pt was changed to hydralazine 100 mg twice daily at this visit. Pt states she was unable to tolerate this change due to fatigue and switched it back to 50 mg twice daily. Pt reported last 3 BP readings: 186/101, 148/81, 158/94. Pt states she does not have  any focal deficits, confusion, slurred speech, headaches, facial droop, or loss of function. Pt states she does continue to have bilateral lower extremity weakness. States she is using her walker but she still feels like her legs are very weak. PT was ordered and pt declined. Pt states this does not help. States she has had PT in the past without relief of symptoms.  Pt states she continues to have neuropathic pain in her legs. States this is intermittent and worse when she is inactive. States she is taking the Requip with some relief of symptoms. She was previously on gabapentin but felt this was no longer working and causing her to feel weak and loopy.  Pt reports her DM has been well controlled. Blood sugar 98 this morning. No polyuria, polydipsia, or polyphagia. No hypoglycemic episodes.   Hypertension This is a chronic problem. The current episode started more than 1 year ago. The problem has been waxing and waning since onset. The problem is resistant. Pertinent negatives include no anxiety, blurred vision, chest pain, headaches, malaise/fatigue, neck pain, orthopnea, palpitations, peripheral edema, PND, shortness of breath or sweats. Risk factors for coronary artery disease include diabetes mellitus, dyslipidemia, obesity, post-menopausal state, sedentary lifestyle and stress. Past treatments include ACE inhibitors, angiotensin blockers, beta blockers, calcium channel blockers, lifestyle changes, diuretics and direct vasodilators. Compliance problems include medication side effects, exercise and diet.  Hypertensive end-organ damage includes kidney disease, CAD/MI and PVD.  Diabetes She presents for her follow-up diabetic visit. She has type 2 diabetes mellitus. Her disease course has been stable. Pertinent negatives for hypoglycemia include no confusion, dizziness, headaches, hunger, mood changes, nervousness/anxiousness,  pallor, seizures, sleepiness, speech difficulty, sweats or tremors. Associated  symptoms include foot paresthesias and weakness (generalized). Pertinent negatives for diabetes include no blurred vision, no chest pain, no fatigue, no foot ulcerations, no polydipsia, no polyphagia, no polyuria, no visual change and no weight loss. There are no hypoglycemic complications. Symptoms are stable. Diabetic complications include heart disease, nephropathy, peripheral neuropathy and PVD. Risk factors for coronary artery disease include diabetes mellitus, dyslipidemia, hypertension, obesity, post-menopausal, sedentary lifestyle and stress. Current diabetic treatment includes insulin injections and oral agent (monotherapy). She is compliant with treatment all of the time. Her weight is stable. She is following a generally healthy diet. When asked about meal planning, she reported none. She rarely participates in exercise. She monitors urine at home 1-2 x per day. Blood glucose monitoring compliance is excellent. There is no change in her home blood glucose trend. Her breakfast blood glucose is taken between 7-8 am. Her breakfast blood glucose range is generally 90-110 mg/dl. An ACE inhibitor/angiotensin II receptor blocker is being taken.     Relevant past medical, surgical, family, and social history reviewed and updated as indicated.  Allergies and medications reviewed and updated.   Past Medical History:  Diagnosis Date   Arthritis    Cancer ([place])    breast   Cataract    Diabetes mellitus without complication ([place])    Frequent UTI    Hyperlipidemia    Hypertension    Neck pain    Scoliosis    Stroke [facility])     Past Surgical History:  Procedure Laterality Date   APPENDECTOMY     BREAST LUMPECTOMY Left    CHOLECYSTECTOMY     JOINT REPLACEMENT     bilat knee    REPLACEMENT TOTAL KNEE BILATERAL Bilateral    TONSILLECTOMY      Social History   Socioeconomic History   Marital status: Widowed    Spouse name: Not on file   Number of children: 2   Years  of education: 59   Highest education level: Some college, no degree  Occupational History   Occupation: Retired    Fish farm manager: Scientist, forensic    Comment: Proofreader strain: Not very hard   Food insecurity    Worry: Never true    Inability: Never true   Transportation needs    Medical: No    Non-medical: No  Tobacco Use   Smoking status: Never Smoker   Smokeless tobacco: Never Used  Substance and Sexual Activity   Alcohol use: No    Alcohol/week: 0.0 standard drinks   Drug use: No   Sexual activity: Not Currently  Lifestyle   Physical activity    Days per week: 0 days    Minutes per session: 0 min   Stress: To some extent  Relationships   Social connections    Talks on phone: Three times a week    Gets together: Never    Attends religious service: Never    Active member of club or organization: No    Attends meetings of clubs or organizations: Never    Relationship status: Widowed   Intimate partner violence    Fear of current or ex partner: No    Emotionally abused: No    Physically abused: No    Forced sexual activity: No  Other Topics Concern   Not on file  Social History Narrative   Lives alone in a one story home.  Husband passed away. She has  two sons 2 sons but.  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  Education: specialty courses with [employer], high school education.    Outpatient Encounter Medications as of 01/24/2019  Medication Sig   apixaban (ELIQUIS) 5 MG TABS tablet Take 1 tablet (5 mg total) by mouth 2 (two) times daily.   Cholecalciferol (VITAMIN D3) 5000 units CAPS Take 2,000 Units by mouth daily.    D-Mannose 500 MG CAPS Take 500 mg by mouth daily.   glucose blood (ONE TOUCH ULTRA TEST) test strip USE TO check blood sugars twice daily   hydrALAZINE (APRESOLINE) 25 MG tablet Take 1 tablet (25 mg total) by mouth 2 (two) times daily.   hydrALAZINE (APRESOLINE) 50 MG tablet Take 1 tablet (50 mg total)  by mouth 2 (two) times daily.   Insulin Glargine (LANTUS SOLOSTAR) 100 UNIT/ML Solostar Pen INJECT 50 UNITS ONCE DAILY AT 10PM   Insulin Pen Needle 31G X 8 MM MISC Use once daily with lantus solostar   Magnesium 250 MG TABS Take 250 mg by mouth daily.   metFORMIN (GLUCOPHAGE) 500 MG tablet TAKE (1) TABLET DAILY WITH BREAKFAST.   nabumetone (RELAFEN) 500 MG tablet Take 1 tablet (500 mg total) by mouth 2 (two) times daily. For muscleand joint pain   omeprazole (PRILOSEC) 20 MG capsule TAKE  (1)  CAPSULE  TWICE DAILY (TAKE ON AN EMPTY STOMACH AT LEAST 30MIN- UTES BEFORE MEALS).   ondansetron (ZOFRAN) 4 MG tablet TAKE 1 TABLET EVERY 8 HOURS AS NEEDED FOR NAUSEA & VOMITING   rOPINIRole (REQUIP) 0.25 MG tablet Take 1 tablet (0.25 mg total) by mouth 3 (three) times daily for 30 days. Take one tablet in the morning, one tablet at noon and two tablets at bedtime.   SUPER B COMPLEX/C PO Take 1 tablet by mouth daily.   [DISCONTINUED] hydrALAZINE (APRESOLINE) 100 MG tablet Take 1 tablet (100 mg total) by mouth 2 (two) times daily.   No facility-administered encounter medications on file as of 01/24/2019.     Allergies  Allergen Reactions   Diltiazem Hcl     Red itchy rash started a few hours after taking short acting 120mg  dilt tabs   Ace Inhibitors Cough    Review of Systems  Constitutional: Negative for activity change, appetite change, chills, diaphoresis, fatigue, fever, malaise/fatigue, unexpected weight change and weight loss.  HENT: Negative.   Eyes: Negative.  Negative for blurred vision, photophobia and visual disturbance.  Respiratory: Negative for cough, chest tightness and shortness of breath.   Cardiovascular: Negative for chest pain, palpitations, orthopnea, leg swelling and PND.  Gastrointestinal: Negative for abdominal pain, anal bleeding, blood in stool, constipation, diarrhea, nausea and vomiting.  Endocrine: Negative.  Negative for polydipsia, polyphagia and polyuria.    Genitourinary: Negative for decreased urine volume, difficulty urinating, dysuria, frequency, hematuria and urgency.  Musculoskeletal: Positive for gait problem (bilateral leg weakness, uses walker) and myalgias. Negative for arthralgias and neck pain.  Skin: Negative.  Negative for pallor.  Allergic/Immunologic: Negative.   Neurological: Positive for weakness (generalized). Negative for dizziness, tremors, seizures, syncope, facial asymmetry, speech difficulty, light-headedness, numbness and headaches.  Hematological: Negative.  Does not bruise/bleed easily.  Psychiatric/Behavioral: Negative for agitation, confusion, hallucinations, sleep disturbance and suicidal ideas. The patient is not nervous/anxious.   All other systems reviewed and are negative.        Observations/Objective: No vital signs or physical exam, this was a telephone or virtual health encounter.  Pt alert and oriented, answers all questions appropriately,  and able to speak in full sentences.    Assessment and Plan: Nailyn was seen today for medical management of chronic issues, hypertension and diabetes.  Diagnoses and all orders for this visit:  Hypertension associated with diabetes ([place]) History of noncompliance with medication adjustments due to reported intolerance or adverse side effects. Did not follow cardiology orders to increase hydralazine to 100 mg twice daily. Will trial 75 mg twice daily. Pt aware to report persistent readings above 150/90. Pt aware of symptoms that warrant emergent evaluation and treatment. DASH diet discussed. Follow up in 4-6 weeks or sooner if needed. Medications as prescribed.  -     hydrALAZINE (APRESOLINE) 25 MG tablet; Take 1 tablet (25 mg total) by mouth 2 (two) times daily. -     hydrALAZINE (APRESOLINE) 50 MG tablet; Take 1 tablet (50 mg total) by mouth 2 (two) times daily.  Type 2 diabetes mellitus with diabetic neuropathy, with long-term current use of insulin ([facility]) Well  controlled. Continue current regimen. Will check labs at next office visit. Report any persistent high or low readings.   Diabetic polyneuropathy associated with type 2 diabetes mellitus ([place]) Continue Requip. Try to remain active and get some exercise daily. Report any new, worsening symptoms. Continue medications as prescribed.   Physical deconditioning Pt declined PT, states not beneficial for her. Pt reports continued weakness of lower extremities. No injury. Physical deconditioning due to age and lack of exercise. Discussed staying active and exercises she can do to help prevent further deconditioning.    Follow Up Instructions: Return in about 6 weeks (around 03/07/2019), or if symptoms worsen or fail to improve, for HTN.    I discussed the assessment and treatment plan with the patient. The patient was provided an opportunity to ask questions and all were answered. The patient agreed with the plan and demonstrated an understanding of the instructions.   The patient was advised to call back or seek an in-person evaluation if the symptoms worsen or if the condition fails to improve as anticipated.  The above assessment and management plan was discussed with the patient. The patient verbalized understanding of and has agreed to the management plan. Patient is aware to call the clinic if they develop any new symptoms or if symptoms persist or worsen. Patient is aware when to return to the clinic for a follow-up visit. Patient educated on when it is appropriate to go to the emergency department.    I provided 35 minutes of non-face-to-face time during this encounter. The call started at 1115. The call ended at 1150. The other time was used for coordination of care.    Monia Pouch, FNP-C [facility] [address] [place], [place] [address] [phone] 01/24/19

## 2019-01-26 ENCOUNTER — Ambulatory Visit (INDEPENDENT_AMBULATORY_CARE_PROVIDER_SITE_OTHER): Payer: PPO | Admitting: Family

## 2019-01-26 ENCOUNTER — Other Ambulatory Visit: Payer: Self-pay

## 2019-01-26 ENCOUNTER — Encounter: Payer: Self-pay | Admitting: Family

## 2019-01-26 DIAGNOSIS — E1159 Type 2 diabetes mellitus with other circulatory complications: Secondary | ICD-10-CM

## 2019-01-26 DIAGNOSIS — R531 Weakness: Secondary | ICD-10-CM | POA: Diagnosis not present

## 2019-01-26 DIAGNOSIS — I10 Essential (primary) hypertension: Secondary | ICD-10-CM | POA: Diagnosis not present

## 2019-01-26 NOTE — Progress Notes (Signed)
   Virtual Visit via telephone Note Due to COVID-19 pandemic this visit was conducted virtually. This visit type was conducted due to national recommendations for restrictions regarding the COVID-19 Pandemic (e.g. social distancing, sheltering in place) in an effort to limit this patient's exposure and mitigate transmission in our community. All issues noted in this document were discussed and addressed.  A physical exam was not performed with this format.  I connected with [name] on 01/26/19 at 1:52 pm by telephone and verified that I am speaking with the correct person using two identifiers. [name] is currently located at home and no one  is currently with her during visit. The provider, Evelina Dun, FNP is located in their office at time of visit.  I discussed the limitations, risks, security and privacy concerns of performing an evaluation and management service by telephone and the availability of in person appointments. I also discussed with the patient that there may be a patient responsible charge related to this service. The patient expressed understanding and agreed to proceed.   History and Present Illness:  Pt calls  the office today with weakness that started this morning. She states she is currently taking Apresoline 150 mg BID.   Denies any fever, cough, SOB, or UTI symptoms.  Dizziness This is a recurrent problem. The current episode started today. The problem occurs constantly. The problem has been unchanged. Associated symptoms include weakness. Pertinent negatives include no anorexia, change in bowel habit, chest pain, congestion, headaches, nausea, sore throat, vertigo or vomiting.      Review of Systems  HENT: Negative for congestion and sore throat.   Cardiovascular: Negative for chest pain.  Gastrointestinal: Negative for anorexia, change in bowel habit, nausea and vomiting.  Neurological: Positive for dizziness and weakness. Negative for vertigo  and headaches.     Observations/Objective: No SOB or distress noted, 112/55  Assessment and Plan: 1. Weakness - CMP14+EGFR - CBC with Differential/Platelet - Urinalysis, Complete  2. Hypertension associated with diabetes ([place])  She will come in and have lab work completed and leave urine Force fluids, make sure you stay hydrated Hold Apresoline today, unless BP is >180/90. If BP is elevated she should only only take 75 mg tonight If symptoms worsen go to ED RTO if symptoms worsen or do not improve      I discussed the assessment and treatment plan with the patient. The patient was provided an opportunity to ask questions and all were answered. The patient agreed with the plan and demonstrated an understanding of the instructions.   The patient was advised to call back or seek an in-person evaluation if the symptoms worsen or if the condition fails to improve as anticipated.  The above assessment and management plan was discussed with the patient. The patient verbalized understanding of and has agreed to the management plan. Patient is aware to call the clinic if symptoms persist or worsen. Patient is aware when to return to the clinic for a follow-up visit. Patient educated on when it is appropriate to go to the emergency department.   Time call ended: 2:12 pm   I provided 20 minutes of non-face-to-face time during this encounter.    Evelina Dun, FNP

## 2019-02-14 ENCOUNTER — Ambulatory Visit: Payer: Self-pay | Admitting: Licensed Clinical Social Worker

## 2019-02-14 ENCOUNTER — Ambulatory Visit: Payer: Self-pay | Admitting: *Deleted

## 2019-02-14 DIAGNOSIS — Z604 Social exclusion and rejection: Secondary | ICD-10-CM

## 2019-02-14 DIAGNOSIS — E1159 Type 2 diabetes mellitus with other circulatory complications: Secondary | ICD-10-CM

## 2019-02-14 DIAGNOSIS — I5032 Chronic diastolic (congestive) heart failure: Secondary | ICD-10-CM

## 2019-02-14 DIAGNOSIS — J8410 Pulmonary fibrosis, unspecified: Secondary | ICD-10-CM

## 2019-02-14 DIAGNOSIS — I4891 Unspecified atrial fibrillation: Secondary | ICD-10-CM

## 2019-02-14 DIAGNOSIS — Z794 Long term (current) use of insulin: Secondary | ICD-10-CM

## 2019-02-14 DIAGNOSIS — E1140 Type 2 diabetes mellitus with diabetic neuropathy, unspecified: Secondary | ICD-10-CM

## 2019-02-14 DIAGNOSIS — K219 Gastro-esophageal reflux disease without esophagitis: Secondary | ICD-10-CM

## 2019-02-14 DIAGNOSIS — I5033 Acute on chronic diastolic (congestive) heart failure: Secondary | ICD-10-CM

## 2019-02-14 DIAGNOSIS — R5381 Other malaise: Secondary | ICD-10-CM

## 2019-02-14 DIAGNOSIS — M159 Polyosteoarthritis, unspecified: Secondary | ICD-10-CM

## 2019-02-14 NOTE — Chronic Care Management (AMB) (Signed)
  Care Management Note   [name] is a 85 y.o. year old female who is a primary care patient of Baruch Gouty, FNP. The CM team was consulted for assistance with chronic disease management and care coordination.   I reached out to [name] by phone today.    Review of patient status, including review of consultants reports, relevant laboratory and other test results, and collaboration with appropriate care team members and the patient's provider was performed as part of comprehensive patient evaluation and provision of chronic care management services.   Social Determinants of Health: risk of social isolation; risk of stress; risk of physical inactivity    Clinical Support from 11/10/2018 in [facility]  PHQ-9 Total Score  3     Medications   New medications from outside sources are available for reconciliation   apixaban (ELIQUIS) 5 MG TABS tablet    Cholecalciferol (VITAMIN D3) 5000 units CAPS    D-Mannose 500 MG CAPS    glucose blood (ONE TOUCH ULTRA TEST) test strip    hydrALAZINE (APRESOLINE) 25 MG tablet    hydrALAZINE (APRESOLINE) 50 MG tablet    Insulin Glargine (LANTUS SOLOSTAR) 100 UNIT/ML Solostar Pen    Insulin Pen Needle 31G X 8 MM MISC    Magnesium 250 MG TABS    metFORMIN (GLUCOPHAGE) 500 MG tablet    nabumetone (RELAFEN) 500 MG tablet    omeprazole (PRILOSEC) 20 MG capsule    ondansetron (ZOFRAN) 4 MG tablet    rOPINIRole (REQUIP) 0.25 MG tablet(Expired)    SUPER B COMPLEX/C PO    Goals        . "I wish I could get out of the house and go to the grocery store" (pt-stated)     Current Barriers:   Lacks caregiver support.    Social isolation due to [place] Stay at [facility] Orders  Nurse Case Manager Clinical Goal(s):  Marland Kitchen Over the next 30 days, patient will work with CM clinical social worker to address psychosocial issues related to social isolation.   Interventions:  . Advised patient to talk with friends/family on the  phone often for social/emotional support.  . Suggested that patient make a quick trip to the grocery store once or twice a week.  o She has an N95 mask. I advised her to wear it. o Only go in the store if it is not crowded o Limit distance from other people to 6 feet or more o Wash hands often and thoroughly o Talked with client about calling in grocery order to local [place] store and then just driving to store to pay for ordered food and to pick it up . Patient encouraged to reach out with any medical or psychosocial needs . Talked with RNCM about current client needs . Talked with client about recent client fall  Patient Self Care Activities:  . Self administers medications as prescribed . Attends all scheduled provider appointments . Calls pharmacy for medication refills . Attends church or other social activities . Performs ADL's independently . Performs IADL's independently . Calls provider office for new concerns or questions  Initial goal documentation         Follow Up Plan: [name] to call client in next 3 weeks to talk with client about the psychosocial needs of client  [name].Jo Cerone MSW, [name] Licensed Clinical Social Worker [facility] Care Management [phone]

## 2019-02-14 NOTE — Patient Instructions (Signed)
Licensed Clinical Social Worker Visit Information  Goals we discussed today:  Goals        . "I wish I could get out of the house and go to the grocery store" (pt-stated)     Current Barriers:   Lacks caregiver support.    Social isolation due to [place] Stay at [facility] Orders  Nurse Case Manager Clinical Goal(s):  Marland Kitchen Over the next 30 days, patient will work with CM clinical social worker to address psychosocial issues related to social isolation.   Interventions:  . Advised patient to talk with friends/family on the phone often for social/emotional support.  . Suggested that patient make a quick trip to the grocery store once or twice a week.  o She has an N95 mask. I advised her to wear it. o Only go in the store if it is not crowded o Limit distance from other people to 6 feet or more o Wash hands often and thoroughly o Talked with client about calling in grocery order to local [place] store and then just driving to store to pay for ordered food and to pick it up . Collaborated with RNCM related to current client needs.  . Patient encouraged to reach out with any medical or psychosocial needs . Talked with client about recent client fall  Patient Self Care Activities:  . Self administers medications as prescribed . Attends all scheduled provider appointments . Calls pharmacy for medication refills . Attends church or other social activities . Performs ADL's independently . Performs IADL's independently . Calls provider office for new concerns or questions  Initial goal documentation          Materials Provided: No  Follow Up Plan: [name] to call client in next 3 weeks to talk with client at that time about the psychosocial needs of client.  The patient verbalized understanding of instructions provided today and declined a print copy of patient instruction materials.   Norva Riffle.Charles Andringa MSW, [name] Licensed Clinical Social Worker [facility]  Care Management [phone]

## 2019-02-14 NOTE — Patient Instructions (Signed)
   Follow up plan:  Asked PCP clinical staff to call and schedule patient with Darla Lesches, FNP for increased SOB and labs. Can address fall as well.    RN CCM will f/u with patient over the next 3 days.  Chong Sicilian, BSN, RN-BC Embedded Chronic Care Manager [facility] Direct Dial: [phone]

## 2019-02-14 NOTE — Chronic Care Management (AMB) (Signed)
  Chronic Care Management   Care Coordination Note  02/14/2019 Name: [name] MRN: [id number] DOB: Sep 13, 1925   I was contacted by Theadore Nan, [name] today regarding [name]'s potential nursing needs. He talked with her earlier today by phone regarding her psychosocial needs and she relayed that she has been experiencing some increased shortness of breath in the evening and wondered if supplemental oxygen would be beneficial. She saw a commercial for a small portable tank and she though that might be helpful. She does have CHF, Afib, and pulmonary fibrosis. Any of these could be causing her increased SOB. She last had a telephonic office visit with Evelina Dun, FNP on 01/26/19 for weakness and dizziness. She was asked to come in for labs but those have not been drawn.   She also relayed that she fell while stepping off of the side walk going to her car at [employer] today. A couple of men helped her to her feet. She did not lose consciousness and stated that she didn't think anything was broken.      Follow up plan:  Asked PCP clinical staff to call and schedule patient with Darla Lesches, FNP for increased SOB and labs. Can address fall as well.    RN CCM will f/u with patient over the next 3 days.  Chong Sicilian, BSN, RN-BC Embedded Chronic Care Manager [facility] [facility] Direct Dial: [phone]

## 2019-02-15 ENCOUNTER — Ambulatory Visit: Payer: PPO | Admitting: *Deleted

## 2019-02-15 DIAGNOSIS — J8410 Pulmonary fibrosis, unspecified: Secondary | ICD-10-CM

## 2019-02-15 DIAGNOSIS — Z9181 History of falling: Secondary | ICD-10-CM

## 2019-02-15 NOTE — Chronic Care Management (AMB) (Signed)
Chronic Care Management   Follow Up Note   02/15/2019 Name: LUJAIN OBEID MRN: [id number] DOB: 10/08/1925  Referred by: Baruch Gouty, FNP Reason for referral : Chronic Care Management (RNCM follow up)   KATINE CAMBEROS is a 85 y.o. year old female who is a primary care patient of Baruch Gouty, FNP. The CCM team was consulted for assistance with chronic disease management and care coordination needs.    Review of patient status, including review of consultants reports, relevant laboratory and other test results, and collaboration with appropriate care team members and the patient's provider was performed as part of comprehensive patient evaluation and provision of chronic care management services.    Subjective: Fell yesterday at [facility] pushing the cart out to her car. Landed on her knees and struck her right cheek on the curb. She has bilateral knee replacements and they are still painful today with the right greater than the left. She has cleaned the abrasion on her knees and applied neosporin. She did not lose consciousness and has no head complaints today.  She wants to wait another day to see if knee symptoms improve.  She also complains of increased SOB in the evenings and it is worse with increased activity. Saw an advertisement for Boost Oxygen which is a 2L can of pressurized oxygen. She wants to know if that would be useful and if her insurance will cover it. She was on continuous oxygen in the past after diagnosis of pulmonary fibrosis.   Goals Addressed            This Visit's Progress     Patient Stated   . Increased Risk For Falls (pt-stated)       "I don't want to fall"  Current Barriers:  Marland Kitchen Knowledge Deficits related to fall precautions  Nurse Case Manager Clinical Goal(s):  Marland Kitchen Over the next 30 days, patient will demonstrate improved adherence to prescribed treatment plan for decreasing falls as evidenced by patient reporting and review of EMR . Over the next  30days, patient will verbalize using fall risk reduction strategies discussed . Over the next 90 days, patient will not experience additional falls  Interventions:  . Provided verbal education re: Potential causes of falls and Fall prevention strategies . Reviewed medications and discussed potential side effects of medications such as dizziness and frequent urination . Assessed for s/s of orthostatic hypotension . Assessed for falls since last encounter. . Assessed patients knowledge of fall risk prevention secondary to previously provided education.  Patient Self Care Activities:  . Utilize walker (assistive device) appropriately with all ambulation . De-clutter walkways . Change positions slowly . Wear secure fitting shoes at all times with ambulation . Utilize home lighting for dim lit areas  Plan: . CCM RN CM will follow up in 15 days   Please see past updates related to this goal by clicking on the "Past Updates" button in the selected goal      . Shortness of Breath (pt-stated)       "I would like some oxygen to use in the evenings when I get short-of-breath"  Current Barriers:  Marland Kitchen Knowledge Deficits related to pulmonary fibrosis disease process and supplemental oxygen use  Nurse Case Manager Clinical Goal(s):  Marland Kitchen Over the next 30 days, patient will meet with RN Care Manager to address SOB and supplemental oxygen needs.   Interventions:  . Evaluation of current treatment plan related to pulmonary fibrosis and patient's adherence to plan as established  by provider. . Discussed plans with patient for ongoing care management follow up and provided patient with direct contact information for care management team . Discussed HPI o Increased SOB in the evening hours and with activity for months. No associated symptoms. Feels well otherwise.  . Advised patient that a face-to-face visit is necessary for Medicare to cover home oxygen . Contacted PCP, Monia Pouch, FNP regarding  request for home oxygen.  . Tentative appt scheduled for 03/04/2019. Asked PCP to advise if a face-to-face visit is possible due to Covid appointment restrictions related to SOB  Patient Self Care Activities:  . Self administers medications as prescribed . Calls pharmacy for medication refills . Calls provider office for new concerns or questions   Initial goal documentation        Follow up Plan:  The care management team will reach out to the patient again over the next 7 days.    Chong Sicilian, BSN, RN-BC Embedded Chronic Care Manager [facility] Direct Dial: [phone]

## 2019-02-15 NOTE — Patient Instructions (Signed)
Visit Information  Goals Addressed            This Visit's Progress     Patient Stated   . Increased Risk For Falls (pt-stated)       "I don't want to fall"  Current Barriers:  Marland Kitchen Knowledge Deficits related to fall precautions  Nurse Case Manager Clinical Goal(s):  Marland Kitchen Over the next 30 days, patient will demonstrate improved adherence to prescribed treatment plan for decreasing falls as evidenced by patient reporting and review of EMR . Over the next 30days, patient will verbalize using fall risk reduction strategies discussed . Over the next 90 days, patient will not experience additional falls  Interventions:  . Provided verbal education re: Potential causes of falls and Fall prevention strategies . Reviewed medications and discussed potential side effects of medications such as dizziness and frequent urination . Assessed for s/s of orthostatic hypotension . Assessed for falls since last encounter. . Assessed patients knowledge of fall risk prevention secondary to previously provided education.  Patient Self Care Activities:  . Utilize walker (assistive device) appropriately with all ambulation . De-clutter walkways . Change positions slowly . Wear secure fitting shoes at all times with ambulation . Utilize home lighting for dim lit areas  Plan: . CCM RN CM will follow up in 15 days   Please see past updates related to this goal by clicking on the "Past Updates" button in the selected goal      . Shortness of Breath (pt-stated)       "I would like some oxygen to use in the evenings when I get short-of-breath"  Current Barriers:  Marland Kitchen Knowledge Deficits related to pulmonary fibrosis disease process and supplemental oxygen use  Nurse Case Manager Clinical Goal(s):  Marland Kitchen Over the next 30 days, patient will meet with RN Care Manager to address SOB and supplemental oxygen needs.   Interventions:  . Evaluation of current treatment plan related to pulmonary fibrosis and  patient's adherence to plan as established by provider. . Discussed plans with patient for ongoing care management follow up and provided patient with direct contact information for care management team . Discussed HPI o Increased SOB in the evening hours and with activity for months. No associated symptoms. Feels well otherwise.  . Advised patient that a face-to-face visit is necessary for Medicare to cover home oxygen . Contacted PCP, Monia Pouch, FNP regarding request for home oxygen.  . Tentative appt scheduled for 03/04/2019. Asked PCP to advise if a face-to-face visit is possible due to Covid appointment restrictions related to SOB  Patient Self Care Activities:  . Self administers medications as prescribed . Calls pharmacy for medication refills . Calls provider office for new concerns or questions   Initial goal documentation        The patient verbalized understanding of instructions provided today and declined a print copy of patient instruction materials.   The care management team will reach out to the patient again over the next 7 days.   Chong Sicilian, BSN, RN-BC Embedded Chronic Care Manager [facility] Direct Dial: [phone]

## 2019-02-17 ENCOUNTER — Ambulatory Visit (INDEPENDENT_AMBULATORY_CARE_PROVIDER_SITE_OTHER): Payer: PPO | Admitting: *Deleted

## 2019-02-17 DIAGNOSIS — I10 Essential (primary) hypertension: Secondary | ICD-10-CM | POA: Diagnosis not present

## 2019-02-17 DIAGNOSIS — E1159 Type 2 diabetes mellitus with other circulatory complications: Secondary | ICD-10-CM | POA: Diagnosis not present

## 2019-02-17 DIAGNOSIS — J8410 Pulmonary fibrosis, unspecified: Secondary | ICD-10-CM

## 2019-02-17 DIAGNOSIS — I5032 Chronic diastolic (congestive) heart failure: Secondary | ICD-10-CM

## 2019-02-17 NOTE — Chronic Care Management (AMB) (Signed)
Chronic Care Management   Follow Up Note   02/17/2019 Name: [name] MRN: [id number] DOB: 1925/06/10  Referred by: Baruch Gouty, FNP Reason for referral : Chronic Care Management (CCM Follow up)   [name] is a 85 y.o. year old female who is a primary care patient of Baruch Gouty, FNP. The CCM team was consulted for assistance with chronic disease management and care coordination needs.    Review of patient status, including review of consultants reports, relevant laboratory and other test results, and collaboration with appropriate care team members and the patient's provider was performed as part of comprehensive patient evaluation and provision of chronic care management services.    I spoke with [name] by telephone today. She has an acute issue of vaginal burning and itching with recent antibiotic use. Denies any UTI symptoms and has not noticed a vaginal discharge. She did speak with the pharmacist Suanne Marker at [facility] who recommended OTC Monistat Cream and they plan to deliver that today. I agree that Monistat seems appropriate and recommended that [name] reach out to me or her PCP office if the symptoms worsen or do not improve.   She continues to have some soreness in her hands and knees due to her recent fall but this is not worsening and she still prefers to wait it out a little longer before coming in for an assessment.   We also discussed her blood pressure management and her concerns regarding that. She is currently taking hydralazine 50mg  BID despite her cardiologists recommendation to increase to try 75mg  BID since she was unable to tolerate 100mg  BID. She is also scheduled for a face-to-face evaluation on 03/04/19 for home supplemental oxygen. Due to Covid, she will be screened before her visit per protocol, and may be restricted from coming in to the office based on that screening. She will work with her PCP to find a suitable alternative  if that occurs.   Outpatient Encounter Medications as of 02/17/2019  Medication Sig  . apixaban (ELIQUIS) 5 MG TABS tablet Take 1 tablet (5 mg total) by mouth 2 (two) times daily.  . Cholecalciferol (VITAMIN D3) 5000 units CAPS Take 2,000 Units by mouth daily.   . D-Mannose 500 MG CAPS Take 500 mg by mouth daily.  Marland Kitchen glucose blood (ONE TOUCH ULTRA TEST) test strip USE TO check blood sugars twice daily  . hydrALAZINE (APRESOLINE) 25 MG tablet Take 1 tablet (25 mg total) by mouth 2 (two) times daily.  . hydrALAZINE (APRESOLINE) 50 MG tablet Take 1 tablet (50 mg total) by mouth 2 (two) times daily.  . Insulin Glargine (LANTUS SOLOSTAR) 100 UNIT/ML Solostar Pen INJECT 50 UNITS ONCE DAILY AT 10PM  . Insulin Pen Needle 31G X 8 MM MISC Use once daily with lantus solostar  . Magnesium 250 MG TABS Take 250 mg by mouth daily.  . metFORMIN (GLUCOPHAGE) 500 MG tablet TAKE (1) TABLET DAILY WITH BREAKFAST.  . nabumetone (RELAFEN) 500 MG tablet Take 1 tablet (500 mg total) by mouth 2 (two) times daily. For muscleand joint pain  . omeprazole (PRILOSEC) 20 MG capsule TAKE  (1)  CAPSULE  TWICE DAILY (TAKE ON AN EMPTY STOMACH AT LEAST 30MIN- UTES BEFORE MEALS).  Marland Kitchen ondansetron (ZOFRAN) 4 MG tablet TAKE 1 TABLET EVERY 8 HOURS AS NEEDED FOR NAUSEA & VOMITING  . rOPINIRole (REQUIP) 0.25 MG tablet Take 1 tablet (0.25 mg total) by mouth 3 (three) times daily for 30 days. Take one  tablet in the morning, one tablet at noon and two tablets at bedtime.  . SUPER B COMPLEX/C PO Take 1 tablet by mouth daily.   No facility-administered encounter medications on file as of 02/17/2019.     Goals Addressed            This Visit's Progress     Patient Stated   . Blood Pressure Management (pt-stated)       "I want to get my blood pressure down because I'm worried that I will have a stroke"  Current Barriers:  . Lacks caregiver support.  . Film/video editor.  . Transportation barriers . Chronic Disease Management  support and education needs related to hypertension and stroke risk  Nurse Case Manager Clinical Goal(s):  Marland Kitchen Over the next 7 days, patient will verbalize understanding of treatments for blood pressure management   Interventions:  . Discussed current treatment for HTN . Medications reviewed and exceptions noted o Currently taking hydralazine 50mg  BID o Could not tolerate 100mg  o Cardio recommended 75mg  BID for 1 week and then increase to 100mg  if tolerated . Reviewed recent home BP readings. Tests twice a dice normally. o Patient reports only having two readings this month below 150/90. Marland Kitchen Recommend that patient take hydralazine 75mg  BID as directed by cardio and PCP . Provided with CCM contact information . Encouraged patient to call RN CCM or PCP with any medication side effects or for continued blood pressure readings above 150/90.   Patient Self Care Activities:  . Self administers medications as prescribed . Calls pharmacy for medication refills . Calls provider office for new concerns or questions . Feels unable to leave her home safely on her own  Please see past updates related to this goal by clicking on the "Past Updates" button in the selected goal      . Shortness of Breath (pt-stated)       "I would like some oxygen to use in the evenings when I get short-of-breath"  Current Barriers:  Marland Kitchen Knowledge Deficits related to pulmonary fibrosis disease process and supplemental oxygen use  Nurse Case Manager Clinical Goal(s):  Marland Kitchen Over the next 30 days, patient will meet with RN Care Manager to address SOB and supplemental oxygen needs.   Interventions:  . Evaluation of current treatment plan related to pulmonary fibrosis and patient's adherence to plan as established by provider. . Discussed plans with patient for ongoing care management follow up and provided patient with direct contact information for care management team . Discussed HPI o Increased SOB in the evening hours  and with activity for months. No associated symptoms. Feels well otherwise.  . Advised patient that a face-to-face visit is necessary for Medicare to cover home oxygen . Collaborated with PCP . Advised patient that she will be screened prior to the visit and alternative arrangements may have to be made depending on the screening results  Patient Self Care Activities:  . Self administers medications as prescribed . Calls pharmacy for medication refills . Calls provider office for new concerns or questions   Please see past updates related to this goal by clicking on the "Past Updates" button in the selected goal        Follow Up Plan RN CCM will follow up with patient over the next 7 days Patient will call with any questions or concerns Patient will keep appt on 10/30 with PCP unless advised otherwise   Chong Sicilian, BSN, RN-BC [place] [facility] Direct Dial: [phone]

## 2019-02-17 NOTE — Patient Instructions (Signed)
Goals Addressed            This Visit's Progress     Patient Stated   . Blood Pressure Management (pt-stated)       "I want to get my blood pressure down because I'm worried that I will have a stroke"  Current Barriers:  . Lacks caregiver support.  . Film/video editor.  . Transportation barriers . Chronic Disease Management support and education needs related to hypertension and stroke risk  Nurse Case Manager Clinical Goal(s):  Marland Kitchen Over the next 7 days, patient will verbalize understanding of treatments for blood pressure management   Interventions:  . Discussed current treatment for HTN . Medications reviewed and exceptions noted o Currently taking hydralazine 50mg  BID o Could not tolerate 100mg  o Cardio recommended 75mg  BID for 1 week and then increase to 100mg  if tolerated . Reviewed recent home BP readings. Tests twice a dice normally. o Patient reports only having two readings this month below 150/90. Marland Kitchen Recommend that patient take hydralazine 75mg  BID as directed by cardio and PCP . Provided with CCM contact information . Encouraged patient to call RN CCM or PCP with any medication side effects or for continued blood pressure readings above 150/90.   Patient Self Care Activities:  . Self administers medications as prescribed . Calls pharmacy for medication refills . Calls provider office for new concerns or questions . Feels unable to leave her home safely on her own  Please see past updates related to this goal by clicking on the "Past Updates" button in the selected goal      . Shortness of Breath (pt-stated)       "I would like some oxygen to use in the evenings when I get short-of-breath"  Current Barriers:  Marland Kitchen Knowledge Deficits related to pulmonary fibrosis disease process and supplemental oxygen use  Nurse Case Manager Clinical Goal(s):  Marland Kitchen Over the next 30 days, patient will meet with RN Care Manager to address SOB and supplemental oxygen needs.    Interventions:  . Evaluation of current treatment plan related to pulmonary fibrosis and patient's adherence to plan as established by provider. . Discussed plans with patient for ongoing care management follow up and provided patient with direct contact information for care management team . Discussed HPI o Increased SOB in the evening hours and with activity for months. No associated symptoms. Feels well otherwise.  . Advised patient that a face-to-face visit is necessary for Medicare to cover home oxygen . Collaborated with PCP . Advised patient that she will be screened prior to the visit and alternative arrangements may have to be made depending on the screening results  Patient Self Care Activities:  . Self administers medications as prescribed . Calls pharmacy for medication refills . Calls provider office for new concerns or questions   Please see past updates related to this goal by clicking on the "Past Updates" button in the selected goal        Follow Up Plan RN CCM will follow up with patient over the next 7 days Patient will call with any questions or concerns Patient will keep appt on 10/30 with PCP unless advised otherwise   Chong Sicilian, BSN, RN-BC [place] [facility] [place] [facility] Direct Dial: [phone]

## 2019-02-18 ENCOUNTER — Telehealth: Payer: PPO

## 2019-02-23 ENCOUNTER — Ambulatory Visit: Payer: PPO | Admitting: *Deleted

## 2019-02-23 DIAGNOSIS — E1140 Type 2 diabetes mellitus with diabetic neuropathy, unspecified: Secondary | ICD-10-CM

## 2019-02-23 DIAGNOSIS — Z794 Long term (current) use of insulin: Secondary | ICD-10-CM

## 2019-02-28 NOTE — Chronic Care Management (AMB) (Signed)
  Chronic Care Management   Follow Up Note   02/24/2019 Name: KENSY HUITT MRN: [id number] DOB: 1925/12/06  Referred by: Baruch Gouty, FNP Reason for referral : Chronic Care Management (RN follow-up)   TELANA ZANELLA is a 85 y.o. year old female who is a primary care patient of Baruch Gouty, FNP. The CCM team was consulted for assistance with chronic disease management and care coordination needs.    An unsuccessful telephone follow-up attempt was made today. Ms Revuelta is scheduled for a face-to-face visit on 03/04/2019 with Darla Lesches, FNP as long as she passes the Covid screening questions.   Follow-up Plan I will f/u with Ms Chugg by telephone over the next 7 days.    Chong Sicilian, BSN, RN-BC Embedded Chronic Care Manager [facility] [place] [facility] Direct Dial: [phone]

## 2019-03-02 ENCOUNTER — Telehealth: Payer: Self-pay | Admitting: Family Medicine

## 2019-03-02 NOTE — Telephone Encounter (Signed)
Patient aware and verbalized understanding. °

## 2019-03-03 ENCOUNTER — Other Ambulatory Visit: Payer: Self-pay

## 2019-03-04 ENCOUNTER — Ambulatory Visit (INDEPENDENT_AMBULATORY_CARE_PROVIDER_SITE_OTHER): Payer: PPO | Admitting: Family Medicine

## 2019-03-04 ENCOUNTER — Encounter: Payer: Self-pay | Admitting: Family Medicine

## 2019-03-04 ENCOUNTER — Ambulatory Visit (INDEPENDENT_AMBULATORY_CARE_PROVIDER_SITE_OTHER): Payer: PPO

## 2019-03-04 VITALS — BP 175/86 | HR 67 | Temp 96.6°F | Resp 20 | Ht <= 58 in | Wt 153.0 lb

## 2019-03-04 DIAGNOSIS — E785 Hyperlipidemia, unspecified: Secondary | ICD-10-CM

## 2019-03-04 DIAGNOSIS — R0609 Other forms of dyspnea: Secondary | ICD-10-CM

## 2019-03-04 DIAGNOSIS — I700 Atherosclerosis of aorta: Secondary | ICD-10-CM | POA: Diagnosis not present

## 2019-03-04 DIAGNOSIS — E1140 Type 2 diabetes mellitus with diabetic neuropathy, unspecified: Secondary | ICD-10-CM

## 2019-03-04 DIAGNOSIS — R0602 Shortness of breath: Secondary | ICD-10-CM | POA: Diagnosis not present

## 2019-03-04 DIAGNOSIS — R0600 Dyspnea, unspecified: Secondary | ICD-10-CM | POA: Diagnosis not present

## 2019-03-04 DIAGNOSIS — N760 Acute vaginitis: Secondary | ICD-10-CM

## 2019-03-04 DIAGNOSIS — E1169 Type 2 diabetes mellitus with other specified complication: Secondary | ICD-10-CM | POA: Diagnosis not present

## 2019-03-04 DIAGNOSIS — R300 Dysuria: Secondary | ICD-10-CM

## 2019-03-04 DIAGNOSIS — Z794 Long term (current) use of insulin: Secondary | ICD-10-CM

## 2019-03-04 DIAGNOSIS — I10 Essential (primary) hypertension: Secondary | ICD-10-CM

## 2019-03-04 DIAGNOSIS — R252 Cramp and spasm: Secondary | ICD-10-CM | POA: Diagnosis not present

## 2019-03-04 DIAGNOSIS — E1159 Type 2 diabetes mellitus with other circulatory complications: Secondary | ICD-10-CM

## 2019-03-04 DIAGNOSIS — R413 Other amnesia: Secondary | ICD-10-CM | POA: Insufficient documentation

## 2019-03-04 LAB — URINALYSIS, COMPLETE
Bilirubin, UA: NEGATIVE
Glucose, UA: NEGATIVE
Ketones, UA: NEGATIVE
Leukocytes,UA: NEGATIVE
Nitrite, UA: NEGATIVE
Protein,UA: NEGATIVE
RBC, UA: NEGATIVE
Specific Gravity, UA: 1.015 (ref 1.005–1.030)
Urobilinogen, Ur: 0.2 mg/dL (ref 0.2–1.0)
pH, UA: 6 (ref 5.0–7.5)

## 2019-03-04 LAB — BAYER DCA HB A1C WAIVED: HB A1C (BAYER DCA - WAIVED): 5.8 % (ref <7.0)

## 2019-03-04 LAB — MICROSCOPIC EXAMINATION

## 2019-03-04 MED ORDER — HYDRALAZINE HCL 50 MG PO TABS: 50.0000 mg | ORAL_TABLET | Freq: Two times a day (BID) | ORAL | 3 refills | Status: DC

## 2019-03-04 MED ORDER — ROPINIROLE HCL 0.25 MG PO TABS: 0.2500 mg | ORAL_TABLET | Freq: Three times a day (TID) | ORAL | 3 refills | Status: DC

## 2019-03-04 MED ORDER — NYSTATIN 100000 UNIT/GM EX CREA: 1.0000 "application " | TOPICAL_CREAM | Freq: Two times a day (BID) | CUTANEOUS | 1 refills | Status: DC

## 2019-03-04 MED ORDER — HYDRALAZINE HCL 25 MG PO TABS: 25.0000 mg | ORAL_TABLET | Freq: Two times a day (BID) | ORAL | 3 refills | Status: DC

## 2019-03-04 NOTE — Progress Notes (Signed)
Subjective:  Patient ID: [name], female    DOB: 06-Apr-1926, 85 y.o.   MRN: [id number]  Patient Care Team: Baruch Gouty, FNP as PCP - General (Family Medicine) Steffanie Rainwater, DPM as Consulting Physician (Podiatry) Marin Comment, Allison Quarry, MD as Consulting Physician (Optometry) Ilean China, RN as Case Manager Forrest, Norva Riffle, [name] as Social Worker (Licensed Clinical Social Worker)   Chief Complaint:  4 week recheck   HPI: [name] is a 85 y.o. female presenting on 03/04/2019 for 4 week recheck   1. Hypertension associated with diabetes ([facility]) Complaint with meds - No, has only been taking 50 mg of apresoline twice daily instead of 75 mg.  Current Medications - Apresoline 75 mg twice daily, only taking 50 mg twice daily Checking BP at home ranging 130-180 / 65-100 Exercising Regularly - No Watching Salt intake - Yes Pertinent ROS:  Headache - No Fatigue - No Visual Disturbances - No Chest pain - No Dyspnea - Yes Palpitations - No LE edema - No They report good compliance with medications and can restate their regimen by memory. No medication side effects.  Family, social, and smoking history reviewed.   BP Readings from Last 3 Encounters:  03/04/19 (!) 175/86  12/29/18 (!) 161/78  12/03/18 (!) 174/83   CMP Latest Ref Rng & Units 10/14/2018 05/20/2018 05/13/2018  Glucose 65 - 99 mg/dL 85 135(H) 145(H)  BUN 10 - 36 mg/dL 20 21 27(H)  Creatinine 0.57 - 1.00 mg/dL 0.80 0.94 1.04(H)  Sodium 134 - 144 mmol/L 145(H) 141 136  Potassium 3.5 - 5.2 mmol/L 4.6 4.0 3.7  Chloride 96 - 106 mmol/L 102 98 99  CO2 20 - 29 mmol/L _0 Calcium 8.7 - 10.3 mg/dL 9.4 9.9 9.1  Total Protein 6.0 - 8.5 g/dL 6.7 - -  Total Bilirubin 0.0 - 1.2 mg/dL 0.4 - -  Alkaline Phos 39 - 117 IU/L 61 - -  AST 0 - 40 IU/L 21 - -  ALT 0 - 32 IU/L 24 - -      2. Leg cramps Ongoing leg cramps that are worse in the mornings. Requip is helpful in the evenings and at night.   3. Type  2 diabetes mellitus with diabetic neuropathy, with long-term current use of insulin (HCC) Pt presents for follow up evaluation of Type 2 diabetes mellitus.  Current symptoms include none. Patient denies foot ulcerations, hyperglycemia, hypoglycemia , increased appetite, nausea, paresthesia of the feet, polydipsia, polyuria, visual disturbances and vomiting.  Current diabetic medications include Lantus and Metformin Compliant with meds - Yes  Current monitoring regimen: home blood tests - 1-2 times daily Home blood sugar records: trend: stable Any episodes of hypoglycemia? no  Known diabetic complications: peripheral neuropathy, cardiovascular disease and cerebrovascular disease Cardiovascular risk factors: advanced age (older than 74 for men, 58 for women), diabetes mellitus, dyslipidemia, hypertension, obesity (BMI >= 30 kg/m2) and sedentary lifestyle Eye exam current (within one year): no Podiatry yearly?  No Weight trend: stable Current diet: in general, a "healthy" diet   Current exercise: none  PNA Vaccine UTD?  Yes Hep B Vaccine?  Yes Tdap Vaccine UTD?  Yes Urine microalbumin UTD? Yes  Is She on ACE inhibitor or angiotensin II receptor blocker?  No, intolerant  Is She on statin? No intolerant Is She on ASA 81 mg daily?  No, on Eliquis   5. DOE (dyspnea on exertion) Ongoing intermittent DOE. No PND or orthopnea.  No lower extremity swelling, chest pain, dizziness, or syncope. No cough, congestion, fever, chills, weakness, or confusion.   6. Hyperlipidemia associated with type 2 diabetes mellitus ([place]) Currently diet controlled. Does try to eat healthy. Avoids fried foods. No regular exercise.   Lab Results  Component Value Date   CHOL 222 (H) 10/14/2018   HDL 38 (L) 10/14/2018   LDLCALC 142 (H) 10/14/2018   TRIG 209 (H) 10/14/2018   CHOLHDL 5.8 (H) 10/14/2018     Family and personal medical history reviewed. Smoking and ETOH history reviewed.    7. Dysuria Pt  reports several days of external vaginal irritation that causes dysuria. No abdominal or flank pain. No fever, chills, weakness, or frequency. No urgency or incontinence.      Relevant past medical, surgical, family, and social history reviewed and updated as indicated.  Allergies and medications reviewed and updated. Date reviewed: Chart in Epic.   Past Medical History:  Diagnosis Date  . Arthritis   . Cancer ([place])    breast  . Cataract   . Diabetes mellitus without complication ([place])   . Frequent UTI   . Hyperlipidemia   . Hypertension   . Neck pain   . Scoliosis   . Stroke [facility])     Past Surgical History:  Procedure Laterality Date  . APPENDECTOMY    . BREAST LUMPECTOMY Left   . CHOLECYSTECTOMY    . JOINT REPLACEMENT     bilat knee   . REPLACEMENT TOTAL KNEE BILATERAL Bilateral   . TONSILLECTOMY      Social History   Socioeconomic History  . Marital status: Widowed    Spouse name: Not on file  . Number of children: 2  . Years of education: 55  . Highest education level: Some college, no degree  Occupational History  . Occupation: Retired    Fish farm manager: Scientist, forensic    Comment: Network engineer  Social Needs  . Financial resource strain: Not very hard  . Food insecurity    Worry: Never true    Inability: Never true  . Transportation needs    Medical: No    Non-medical: No  Tobacco Use  . Smoking status: Never Smoker  . Smokeless tobacco: Never Used  Substance and Sexual Activity  . Alcohol use: No    Alcohol/week: 0.0 standard drinks  . Drug use: No  . Sexual activity: Not Currently  Lifestyle  . Physical activity    Days per week: 0 days    Minutes per session: 0 min  . Stress: To some extent  Relationships  . Social Herbalist on phone: Three times a week    Gets together: Never    Attends religious service: Never    Active member of club or organization: No    Attends meetings of clubs or organizations: Never    Relationship status:  Widowed  . Intimate partner violence    Fear of current or ex partner: No    Emotionally abused: No    Physically abused: No    Forced sexual activity: No  Other Topics Concern  . Not on file  Social History Narrative   Lives alone in a one story home.  Husband passed away. She has two sons 2 sons but.  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  Education: specialty courses with [employer], high school education.    Outpatient Encounter Medications as of 03/04/2019  Medication Sig  . apixaban (ELIQUIS) 5 MG TABS tablet Take  1 tablet (5 mg total) by mouth 2 (two) times daily.  . Cholecalciferol (VITAMIN D3) 5000 units CAPS Take 2,000 Units by mouth daily.   . D-Mannose 500 MG CAPS Take 500 mg by mouth daily.  Marland Kitchen glucose blood (ONE TOUCH ULTRA TEST) test strip USE TO check blood sugars twice daily  . Insulin Glargine (LANTUS SOLOSTAR) 100 UNIT/ML Solostar Pen INJECT 50 UNITS ONCE DAILY AT 10PM  . Insulin Pen Needle 31G X 8 MM MISC Use once daily with lantus solostar  . Magnesium 250 MG TABS Take 250 mg by mouth daily.  . metFORMIN (GLUCOPHAGE) 500 MG tablet TAKE (1) TABLET DAILY WITH BREAKFAST.  . nabumetone (RELAFEN) 500 MG tablet Take 1 tablet (500 mg total) by mouth 2 (two) times daily. For muscleand joint pain  . omeprazole (PRILOSEC) 20 MG capsule TAKE  (1)  CAPSULE  TWICE DAILY (TAKE ON AN EMPTY STOMACH AT LEAST 30MIN- UTES BEFORE MEALS).  Marland Kitchen ondansetron (ZOFRAN) 4 MG tablet TAKE 1 TABLET EVERY 8 HOURS AS NEEDED FOR NAUSEA & VOMITING  . SUPER B COMPLEX/C PO Take 1 tablet by mouth daily.  . hydrALAZINE (APRESOLINE) 25 MG tablet Take 1 tablet (25 mg total) by mouth 2 (two) times daily.  . hydrALAZINE (APRESOLINE) 50 MG tablet Take 1 tablet (50 mg total) by mouth 2 (two) times daily.  Marland Kitchen nystatin cream (MYCOSTATIN) Apply 1 application topically 2 (two) times daily.  Marland Kitchen rOPINIRole (REQUIP) 0.25 MG tablet Take 1 tablet (0.25 mg total) by mouth 3 (three) times daily. Take one tablet in the morning,  one tablet at noon and two tablets at bedtime.  . [DISCONTINUED] hydrALAZINE (APRESOLINE) 25 MG tablet Take 1 tablet (25 mg total) by mouth 2 (two) times daily.  . [DISCONTINUED] hydrALAZINE (APRESOLINE) 50 MG tablet Take 1 tablet (50 mg total) by mouth 2 (two) times daily.  . [DISCONTINUED] rOPINIRole (REQUIP) 0.25 MG tablet Take 1 tablet (0.25 mg total) by mouth 3 (three) times daily for 30 days. Take one tablet in the morning, one tablet at noon and two tablets at bedtime.   No facility-administered encounter medications on file as of 03/04/2019.     Allergies  Allergen Reactions  . Diltiazem Hcl     Red itchy rash started a few hours after taking short acting 118m dilt tabs  . Ace Inhibitors Cough    Review of Systems  Constitutional: Negative for activity change, appetite change, chills, diaphoresis, fatigue, fever and unexpected weight change.  HENT: Negative.   Eyes: Negative.  Negative for photophobia and visual disturbance.  Respiratory: Positive for shortness of breath (with exertion only). Negative for cough, chest tightness and wheezing.   Cardiovascular: Negative for chest pain, palpitations and leg swelling.  Gastrointestinal: Negative for abdominal distention, abdominal pain, anal bleeding, blood in stool, constipation, diarrhea, nausea, rectal pain and vomiting.  Endocrine: Negative.  Negative for cold intolerance, heat intolerance, polydipsia, polyphagia and polyuria.  Genitourinary: Positive for dysuria. Negative for decreased urine volume, difficulty urinating, flank pain, frequency, hematuria, urgency, vaginal bleeding, vaginal discharge and vaginal pain.       External vaginal irritation  Musculoskeletal: Positive for arthralgias (chronic, bilateral knees s/p replacement), gait problem (slow and unsteady, uses walker) and myalgias (leg cramps, worse in the mornings).  Skin: Negative.   Allergic/Immunologic: Negative.   Neurological: Negative for dizziness, tremors,  seizures, syncope, facial asymmetry, speech difficulty, weakness, light-headedness, numbness and headaches.  Hematological: Negative.   Psychiatric/Behavioral: Negative for confusion, hallucinations, sleep disturbance and suicidal ideas.  All other systems reviewed and are negative.       Objective:  BP (!) 175/86   Pulse 67   Temp (!) 96.6 F (35.9 C) (Temporal)   Resp 20   Ht 4' 10" (1.473 m)   Wt 153 lb (69.4 kg)   SpO2 94%   BMI 31.98 kg/m    Wt Readings from Last 3 Encounters:  03/04/19 153 lb (69.4 kg)  12/29/18 154 lb 9.6 oz (70.1 kg)  12/03/18 153 lb (69.4 kg)    Physical Exam Vitals signs and nursing note reviewed.  Constitutional:      General: She is not in acute distress.    Appearance: Normal appearance. She is well-developed and well-groomed. She is obese. She is not ill-appearing, toxic-appearing or diaphoretic.  HENT:     Head: Normocephalic and atraumatic.     Jaw: There is normal jaw occlusion.     Right Ear: Hearing normal.     Left Ear: Hearing normal.     Nose: Nose normal.     Mouth/Throat:     Lips: Pink.     Mouth: Mucous membranes are moist.     Pharynx: Oropharynx is clear. Uvula midline.  Eyes:     General: Lids are normal.     Extraocular Movements: Extraocular movements intact.     Conjunctiva/sclera: Conjunctivae normal.     Pupils: Pupils are equal, round, and reactive to light.  Neck:     Musculoskeletal: Normal range of motion and neck supple.     Thyroid: No thyroid mass, thyromegaly or thyroid tenderness.     Vascular: No carotid bruit or JVD.     Trachea: Trachea and phonation normal.  Cardiovascular:     Rate and Rhythm: Normal rate. Rhythm regularly irregular.     Chest Wall: PMI is not displaced.     Pulses: Normal pulses.     Heart sounds: Normal heart sounds. No murmur. No friction rub. No gallop. No S3 sounds.   Pulmonary:     Effort: Pulmonary effort is normal. No respiratory distress.     Breath sounds: Normal  breath sounds. No wheezing.  Abdominal:     General: Abdomen is protuberant. Bowel sounds are normal. There is no distension or abdominal bruit.     Palpations: Abdomen is soft. There is no hepatomegaly or splenomegaly.     Tenderness: There is no abdominal tenderness. There is no right CVA tenderness or left CVA tenderness.     Hernia: No hernia is present.  Genitourinary:    Comments: Pt deferred. Pt reports red, pruritic irritation without discharge or bleeding.  Musculoskeletal: Normal range of motion.     Right knee: She exhibits normal range of motion and no swelling. No tenderness found.     Left knee: She exhibits normal range of motion and no swelling. Tenderness found.     Right lower leg: No edema.     Left lower leg: No edema.     Comments: Healed surgical scars to bilateral anterior knees.  Lymphadenopathy:     Cervical: No cervical adenopathy.  Skin:    General: Skin is warm and dry.     Capillary Refill: Capillary refill takes less than 2 seconds.     Coloration: Skin is not cyanotic, jaundiced or pale.     Findings: Abrasion (healing abrasions to bilateral knees) present. No rash.  Neurological:     General: No focal deficit present.     Mental Status: She is alert and  oriented to person, place, and time.     Cranial Nerves: Cranial nerves are intact. No cranial nerve deficit.     Sensory: Sensation is intact. No sensory deficit.     Motor: Motor function is intact. No weakness.     Coordination: Coordination is intact. Coordination normal.     Gait: Gait abnormal (slow, unsteady, uses walker).     Deep Tendon Reflexes: Reflexes are normal and symmetric. Reflexes normal.  Psychiatric:        Attention and Perception: Attention and perception normal.        Mood and Affect: Mood and affect normal.        Speech: Speech normal.        Behavior: Behavior normal. Behavior is cooperative.        Thought Content: Thought content normal.        Cognition and Memory:  Cognition and memory normal.        Judgment: Judgment normal.     Results for orders placed or performed in visit on 03/04/19  Microscopic Examination   URINE  Result Value Ref Range   WBC, UA 0-5 0 - 5 /hpf   RBC 0-2 0 - 2 /hpf   Epithelial Cells (non renal) 0-10 0 - 10 /hpf   Renal Epithel, UA 0-10 (A) None seen /hpf   Bacteria, UA Few None seen/Few  Bayer DCA Hb A1c Waived  Result Value Ref Range   HB A1C (BAYER DCA - WAIVED) 5.8 <7.0 %  Urinalysis, Complete  Result Value Ref Range   Specific Gravity, UA 1.015 1.005 - 1.030   pH, UA 6.0 5.0 - 7.5   Color, UA Yellow Yellow   Appearance Ur Clear Clear   Leukocytes,UA Negative Negative   Protein,UA Negative Negative/Trace   Glucose, UA Negative Negative   Ketones, UA Negative Negative   RBC, UA Negative Negative   Bilirubin, UA Negative Negative   Urobilinogen, Ur 0.2 0.2 - 1.0 mg/dL   Nitrite, UA Negative Negative   Microscopic Examination See below:        Pertinent labs & imaging results that were available during my care of the patient were reviewed by me and considered in my medical decision making.  Assessment & Plan:  Iyani was seen today for 4 week recheck.  Diagnoses and all orders for this visit:  Type 2 diabetes mellitus with diabetic neuropathy, with long-term current use of insulin (HCC) A1C 5.8 today. Well controlled. No changes in therapy today. Report any persistent high or low readings. Follow up in 3 months.  -     Thyroid Panel With TSH -     Bayer DCA Hb A1c Waived  Hypertension associated with diabetes ([place]) BP remains uncontrolled. Pt has not been taking hydralazine as prescribed. Pt educated on proper dosing of medication, 75 mg twice daily. Pt aware to report any persistent high or low readings. Avoid excessive sodium in diet. Labs pending. Medications as prescribed.  -     hydrALAZINE (APRESOLINE) 25 MG tablet; Take 1 tablet (25 mg total) by mouth 2 (two) times daily. -     hydrALAZINE  (APRESOLINE) 50 MG tablet; Take 1 tablet (50 mg total) by mouth 2 (two) times daily. -     CMP14+EGFR -     CBC with Differential/Platelet -     Thyroid Panel With TSH  Leg cramps Fairly controlled with below. Will continue. Pt aware to stay adequately hydrated. Will check electrolytes today.  -  rOPINIRole (REQUIP) 0.25 MG tablet; Take 1 tablet (0.25 mg total) by mouth 3 (three) times daily. Take one tablet in the morning, one tablet at noon and two tablets at bedtime. -     CMP14+EGFR  Hyperlipidemia associated with type 2 diabetes mellitus ([place]) Diet and exercise encouraged. Labs pending.  -     Lipid panel  DOE (dyspnea on exertion) Ongoing symptoms. No orthopnea or PND. No dyspnea at rest. CXR without acute changes today. Labs pending. Report any new or worsening symptoms.  -     DG Chest 2 View; Future -     CMP14+EGFR -     CBC with Differential/Platelet  Vulvovaginitis Red irritated skin. Will treat with nystatin cream. Report any new, worsening, or persistent symptoms.  -     nystatin cream (MYCOSTATIN); Apply 1 application topically 2 (two) times daily.  Dysuria Urinalysis unremarkable in office. Culture pending, will treat if warranted.  -     Urinalysis, Complete -     Urine Culture -     Microscopic Examination     Continue all other maintenance medications.  Follow up plan: Return in about 3 months (around 06/04/2019), or if symptoms worsen or fail to improve, for DM, HTN.  Continue healthy lifestyle choices, including diet (rich in fruits, vegetables, and lean proteins, and low in salt and simple carbohydrates) and exercise (at least 30 minutes of moderate physical activity daily).  Educational handout given for DASH diet  The above assessment and management plan was discussed with the patient. The patient verbalized understanding of and has agreed to the management plan. Patient is aware to call the clinic if they develop any new symptoms or if symptoms  persist or worsen. Patient is aware when to return to the clinic for a follow-up visit. Patient educated on when it is appropriate to go to the emergency department.   Monia Pouch, FNP-C [facility] [phone]

## 2019-03-04 NOTE — Patient Instructions (Signed)
DASH Eating Plan DASH stands for "Dietary Approaches to Stop Hypertension." The DASH eating plan is a healthy eating plan that has been shown to reduce high blood pressure (hypertension). Additional health benefits may include reducing the risk of type 2 diabetes mellitus, heart disease, and stroke. The DASH eating plan may also help with weight loss.  WHAT DO I NEED TO KNOW ABOUT THE DASH EATING PLAN? For the DASH eating plan, you will follow these general guidelines:  Choose foods with a percent daily value for sodium of less than 5% (as listed on the food label).  Use salt-free seasonings or herbs instead of table salt or sea salt.  Check with your health care provider or pharmacist before using salt substitutes.  Eat lower-sodium products, often labeled as "lower sodium" or "no salt added."  Eat fresh foods.  Eat more vegetables, fruits, and low-fat dairy products.  Choose whole grains. Look for the word "whole" as the first word in the ingredient list.  Choose fish and skinless chicken or turkey more often than red meat. Limit fish, poultry, and meat to 6 oz (170 g) each day.  Limit sweets, desserts, sugars, and sugary drinks.  Choose heart-healthy fats.  Limit cheese to 1 oz (28 g) per day.  Eat more home-cooked food and less restaurant, buffet, and fast food.  Limit fried foods.  Cook foods using methods other than frying.  Limit canned vegetables. If you do use them, rinse them well to decrease the sodium.  When eating at a restaurant, ask that your food be prepared with less salt, or no salt if possible.  WHAT FOODS CAN I EAT? Seek help from a dietitian for individual calorie needs.  Grains Whole grain or whole wheat bread. Brown rice. Whole grain or whole wheat pasta. Quinoa, bulgur, and whole grain cereals. Low-sodium cereals. Corn or whole wheat flour tortillas. Whole grain cornbread. Whole grain crackers. Low-sodium crackers.  Vegetables Fresh or frozen  vegetables (raw, steamed, roasted, or grilled). Low-sodium or reduced-sodium tomato and vegetable juices. Low-sodium or reduced-sodium tomato sauce and paste. Low-sodium or reduced-sodium canned vegetables.   Fruits All fresh, canned (in natural juice), or frozen fruits.  Meat and Other Protein Products Ground beef (85% or leaner), grass-fed beef, or beef trimmed of fat. Skinless chicken or turkey. Ground chicken or turkey. Pork trimmed of fat. All fish and seafood. Eggs. Dried beans, peas, or lentils. Unsalted nuts and seeds. Unsalted canned beans.  Dairy Low-fat dairy products, such as skim or 1% milk, 2% or reduced-fat cheeses, low-fat ricotta or cottage cheese, or plain low-fat yogurt. Low-sodium or reduced-sodium cheeses.  Fats and Oils Tub margarines without trans fats. Light or reduced-fat mayonnaise and salad dressings (reduced sodium). Avocado. Safflower, olive, or canola oils. Natural peanut or almond butter.  Other Unsalted popcorn and pretzels. The items listed above may not be a complete list of recommended foods or beverages. Contact your dietitian for more options.  WHAT FOODS ARE NOT RECOMMENDED?  Grains White bread. White pasta. White rice. Refined cornbread. Bagels and croissants. Crackers that contain trans fat.  Vegetables Creamed or fried vegetables. Vegetables in a cheese sauce. Regular canned vegetables. Regular canned tomato sauce and paste. Regular tomato and vegetable juices.  Fruits Dried fruits. Canned fruit in light or heavy syrup. Fruit juice.  Meat and Other Protein Products Fatty cuts of meat. Ribs, chicken wings, bacon, sausage, bologna, salami, chitterlings, fatback, hot dogs, bratwurst, and packaged luncheon meats. Salted nuts and seeds. Canned beans with salt.    Dairy Whole or 2% milk, cream, half-and-half, and cream cheese. Whole-fat or sweetened yogurt. Full-fat cheeses or blue cheese. Nondairy creamers and whipped toppings. Processed cheese,  cheese spreads, or cheese curds.  Condiments Onion and garlic salt, seasoned salt, table salt, and sea salt. Canned and packaged gravies. Worcestershire sauce. Tartar sauce. Barbecue sauce. Teriyaki sauce. Soy sauce, including reduced sodium. Steak sauce. Fish sauce. Oyster sauce. Cocktail sauce. Horseradish. Ketchup and mustard. Meat flavorings and tenderizers. Bouillon cubes. Hot sauce. Tabasco sauce. Marinades. Taco seasonings. Relishes.  Fats and Oils Butter, stick margarine, lard, shortening, ghee, and bacon fat. Coconut, palm kernel, or palm oils. Regular salad dressings.  Other Pickles and olives. Salted popcorn and pretzels.  The items listed above may not be a complete list of foods and beverages to avoid. Contact your dietitian for more information.  WHERE CAN I FIND MORE INFORMATION? National Heart, Lung, and [employer] Institute: www.nhlbi.nih.gov/health/health-topics/topics/dash/ Document Released: 04/10/2011 Document Revised: 09/05/2013 Document Reviewed: 02/23/2013 ExitCare Patient Information 2015 [employer]. This information is not intended to replace advice given to you by your health care provider. Make sure you discuss any questions you have with your health care provider.   I think that you would greatly benefit from seeing a nutritionist.  If you are interested, please call Dr Sykes at [phone] to schedule an appointment.   

## 2019-03-05 LAB — CBC WITH DIFFERENTIAL/PLATELET
Basophils Absolute: 0.1 10*3/uL (ref 0.0–0.2)
Basos: 1 %
EOS (ABSOLUTE): 0.5 10*3/uL — ABNORMAL HIGH (ref 0.0–0.4)
Eos: 6 %
Hematocrit: 46.2 % (ref 34.0–46.6)
Hemoglobin: 13.9 g/dL (ref 11.1–15.9)
Immature Grans (Abs): 0 10*3/uL (ref 0.0–0.1)
Immature Granulocytes: 0 %
Lymphocytes Absolute: 1.5 10*3/uL (ref 0.7–3.1)
Lymphs: 19 %
MCH: 24.8 pg — ABNORMAL LOW (ref 26.6–33.0)
MCHC: 30.1 g/dL — ABNORMAL LOW (ref 31.5–35.7)
MCV: 83 fL (ref 79–97)
Monocytes Absolute: 0.9 10*3/uL (ref 0.1–0.9)
Monocytes: 12 %
Neutrophils Absolute: 4.7 10*3/uL (ref 1.4–7.0)
Neutrophils: 62 %
Platelets: 242 10*3/uL (ref 150–450)
RBC: 5.6 x10E6/uL — ABNORMAL HIGH (ref 3.77–5.28)
RDW: 17.2 % — ABNORMAL HIGH (ref 11.7–15.4)
WBC: 7.7 10*3/uL (ref 3.4–10.8)

## 2019-03-05 LAB — CMP14+EGFR
ALT: 24 IU/L (ref 0–32)
AST: 28 IU/L (ref 0–40)
Albumin/Globulin Ratio: 1.7 (ref 1.2–2.2)
Albumin: 4.5 g/dL (ref 3.5–4.6)
Alkaline Phosphatase: 63 IU/L (ref 39–117)
BUN/Creatinine Ratio: 22 (ref 12–28)
BUN: 22 mg/dL (ref 10–36)
Bilirubin Total: 0.4 mg/dL (ref 0.0–1.2)
CO2: 24 mmol/L (ref 20–29)
Calcium: 10.2 mg/dL (ref 8.7–10.3)
Chloride: 99 mmol/L (ref 96–106)
Creatinine, Ser: 1.01 mg/dL — ABNORMAL HIGH (ref 0.57–1.00)
GFR calc Af Amer: 56 mL/min/{1.73_m2} — ABNORMAL LOW (ref >59)
GFR calc non Af Amer: 49 mL/min/{1.73_m2} — ABNORMAL LOW (ref >59)
Globulin, Total: 2.7 g/dL (ref 1.5–4.5)
Glucose: 88 mg/dL (ref 65–99)
Potassium: 5 mmol/L (ref 3.5–5.2)
Sodium: 140 mmol/L (ref 134–144)
Total Protein: 7.2 g/dL (ref 6.0–8.5)

## 2019-03-05 LAB — LIPID PANEL
Chol/HDL Ratio: 5.4 ratio — ABNORMAL HIGH (ref 0.0–4.4)
Cholesterol, Total: 255 mg/dL — ABNORMAL HIGH (ref 100–199)
HDL: 47 mg/dL (ref >39)
LDL Chol Calc (NIH): 178 mg/dL — ABNORMAL HIGH (ref 0–99)
Triglycerides: 164 mg/dL — ABNORMAL HIGH (ref 0–149)
VLDL Cholesterol Cal: 30 mg/dL (ref 5–40)

## 2019-03-05 LAB — THYROID PANEL WITH TSH
Free Thyroxine Index: 2.5 (ref 1.2–4.9)
T3 Uptake Ratio: 31 % (ref 24–39)
T4, Total: 8.1 ug/dL (ref 4.5–12.0)
TSH: 1.6 u[IU]/mL (ref 0.450–4.500)

## 2019-03-06 LAB — URINE CULTURE: Organism ID, Bacteria: NO GROWTH

## 2019-03-07 ENCOUNTER — Ambulatory Visit: Payer: Self-pay | Admitting: Licensed Clinical Social Worker

## 2019-03-07 ENCOUNTER — Other Ambulatory Visit: Payer: Self-pay | Admitting: Family Medicine

## 2019-03-07 DIAGNOSIS — M159 Polyosteoarthritis, unspecified: Secondary | ICD-10-CM

## 2019-03-07 DIAGNOSIS — E1159 Type 2 diabetes mellitus with other circulatory complications: Secondary | ICD-10-CM

## 2019-03-07 DIAGNOSIS — E1140 Type 2 diabetes mellitus with diabetic neuropathy, unspecified: Secondary | ICD-10-CM

## 2019-03-07 DIAGNOSIS — I152 Hypertension secondary to endocrine disorders: Secondary | ICD-10-CM

## 2019-03-07 DIAGNOSIS — K219 Gastro-esophageal reflux disease without esophagitis: Secondary | ICD-10-CM

## 2019-03-07 DIAGNOSIS — Z794 Long term (current) use of insulin: Secondary | ICD-10-CM

## 2019-03-07 DIAGNOSIS — Z604 Social exclusion and rejection: Secondary | ICD-10-CM

## 2019-03-07 DIAGNOSIS — Z853 Personal history of malignant neoplasm of breast: Secondary | ICD-10-CM

## 2019-03-07 DIAGNOSIS — I4891 Unspecified atrial fibrillation: Secondary | ICD-10-CM

## 2019-03-07 DIAGNOSIS — J8410 Pulmonary fibrosis, unspecified: Secondary | ICD-10-CM

## 2019-03-07 NOTE — Chronic Care Management (AMB) (Signed)
  Care Management Note   [name] is a 85 y.o. year old female who is a primary care patient of Baruch Gouty, FNP. The CM team was consulted for assistance with chronic disease management and care coordination.   I reached out to client via phone today  Review of patient status, including review of consultants reports, relevant laboratory and other test results, and collaboration with appropriate care team members and the patient's provider was performed as part of comprehensive patient evaluation and provision of chronic care management services.   Social determinants of health: risk of social isolation; risk of stress; risk of physical inactivity    Clinical Support from 11/10/2018 in [facility]  PHQ-9 Total Score  3     Medications    apixaban (ELIQUIS) 5 MG TABS tablet    Cholecalciferol (VITAMIN D3) 5000 units CAPS    D-Mannose 500 MG CAPS    glucose blood (ONE TOUCH ULTRA TEST) test strip    hydrALAZINE (APRESOLINE) 25 MG tablet    hydrALAZINE (APRESOLINE) 50 MG tablet    Insulin Glargine (LANTUS SOLOSTAR) 100 UNIT/ML Solostar Pen    Insulin Pen Needle 31G X 8 MM MISC    Magnesium 250 MG TABS    metFORMIN (GLUCOPHAGE) 500 MG tablet    nabumetone (RELAFEN) 500 MG tablet    nystatin cream (MYCOSTATIN)    omeprazole (PRILOSEC) 20 MG capsule    ondansetron (ZOFRAN) 4 MG tablet    rOPINIRole (REQUIP) 0.25 MG tablet    SUPER B COMPLEX/C PO    Goals    . "I wish I could get out of the house and go to the grocery store" (pt-stated)     Current Barriers:   Lacks caregiver support.    Social isolation due to [place] Stay at [facility] Orders  Nurse Case Manager Clinical Goal(s):  Marland Kitchen Over the next 30 days, patient will work with CM clinical social worker to address psychosocial issues related to social isolation.   Interventions:  . Advised patient to talk with friends/family on the phone often for social/emotional support.  . Suggested that patient  make a quick trip to the grocery store once or twice a week.  o She has an N95 mask. I advised her to wear it. o Only go in the store if it is not crowded o Limit distance from other people to 6 feet or more o Wash hands often and thoroughly o Talked with client about calling in grocery order to local [place] store and then just driving to store to pay for ordered food and to pick it up . Collaborated previously with RNCM related to client needs.  . Patient encouraged to reach out with any medical or psychosocial needs . Provided counseling support for client . Talked with client about social support network of client (son, family friend) . Talked with client about sleep challenges of client  Patient Self Care Activities:  . Self administers medications as prescribed . Attends all scheduled provider appointments . Calls pharmacy for medication refills . Attends church or other social activities . Performs ADL's independently . Performs IADL's independently . Calls provider office for new concerns or questions  Initial goal documentation      Follow Up Plan: [name] to call client in next 3 weeks to talk with client about the psychosocial needs of client  Norva Riffle.Azaylia Fong MSW, [name] Licensed Clinical Social Worker [facility] Care Management [phone]

## 2019-03-07 NOTE — Patient Instructions (Addendum)
Licensed Clinical Social Worker Visit Information  Goals we discussed today:  Goals    . "I wish I could get out of the house and go to the grocery store" (pt-stated)     Current Barriers:   Lacks caregiver support.    Social isolation due to [place] Stay at [place] Orders  Nurse Case Manager Clinical Goal(s):  Marland Kitchen Over the next 30 days, patient will work with CM clinical social worker to address psychosocial issues related to social isolation.   Interventions:  . Advised patient to talk with friends/family on the phone often for social/emotional support.  . Suggested that patient make a quick trip to the grocery store once or twice a week.  o She has an N95 mask. I advised her to wear it. o Only go in the store if it is not crowded o Limit distance from other people to 6 feet or more o Wash hands often and thoroughly o Talked with client about calling in grocery order to local [place] store and then just driving to store to pay for ordered food and to pick it up . Collaborated previously with RNCM related to client needs.  . Patient encouraged to reach out with any medical or psychosocial needs . Provided counseling support for client  Talked with client about social support network of client (son, family friend)  Talked with client about sleep challenges of client  Patient Self Care Activities:  . Self administers medications as prescribed . Attends all scheduled provider appointments . Calls pharmacy for medication refills . Attends church or other social activities . Performs ADL's independently . Performs IADL's independently . Calls provider office for new concerns or questions  Initial goal documentation        Materials Provided: No  Follow Up Plan: [name] to call client in next 3 weeks to talk with client about the psychosocial needs of client  The patient verbalized understanding of instructions provided today and declined a print copy of patient instruction  materials.   Norva Riffle.Jovanna Hodges MSW, [name] Licensed Clinical Social Worker [facility] [phone]

## 2019-03-14 ENCOUNTER — Ambulatory Visit: Payer: PPO | Admitting: *Deleted

## 2019-03-14 NOTE — Chronic Care Management (AMB) (Signed)
  Chronic Care Management   Follow-up Note  03/14/2019 Name: [name] MRN: [id number] DOB: November 29, 1925  I reached out to Ms Earnhardt today after she left me a non-urgent voicemail over the weekend requesting that I give her a call on Monday morning.   Follow up plan: I was unable to speak with her but I left a HIPPA compliant voicemail message that I was returning her call and that she can reach back out at her convenience.  I will follow-up over the next 24 hours if no response.   Chong Sicilian, BSN, RN-BC Embedded Chronic Care Manager [facility] / [facility] Direct Dial: [phone]

## 2019-03-18 ENCOUNTER — Other Ambulatory Visit: Payer: Self-pay | Admitting: Family Medicine

## 2019-03-22 ENCOUNTER — Ambulatory Visit: Payer: PPO | Admitting: *Deleted

## 2019-03-22 DIAGNOSIS — Z794 Long term (current) use of insulin: Secondary | ICD-10-CM

## 2019-03-22 DIAGNOSIS — Z9181 History of falling: Secondary | ICD-10-CM

## 2019-03-22 DIAGNOSIS — I5032 Chronic diastolic (congestive) heart failure: Secondary | ICD-10-CM

## 2019-03-22 DIAGNOSIS — E1140 Type 2 diabetes mellitus with diabetic neuropathy, unspecified: Secondary | ICD-10-CM

## 2019-03-22 NOTE — Chronic Care Management (AMB) (Signed)
  Chronic Care Management   Care Coordination Note   03/22/2019 Name: [name] MRN: [id number] DOB: 15-Nov-1925  Referred by: Joan Gouty, FNP Reason for referral : Chronic Care Management (Follow up and Care Coordination)   [name] is a 85 y.o. year old female who is a primary care patient of Joan Gouty, FNP. The CCM team was consulted for assistance with chronic disease management and care coordination needs.    Review of patient status, including review of consultants reports, relevant laboratory and other test results, and collaboration with appropriate care team members and the patient's provider was performed as part of comprehensive patient evaluation and provision of chronic care management services.     Today I prepared education materials for the patient per our last conversation.    RN Care Plan   . "I want to be able to get around better" (pt-stated)       Current Barriers:  [name] Knowledge Deficits related to strength training exercises . Fear of injury or inability to physically perform exercises . Lacks caregiver support.   Nurse Case Manager Clinical Goal(s):  [name] Over the next 7 days, patient will verbalize understanding of plan for strength training . Over the next 30 days, patient will work with Consulting civil engineer to address needs related to physical deconditioning . Over the next 60 days, patient will work with [place] Therapist (community agency) to increase strength and balance.  Interventions:  . Printed and mailed written educational materials on seated exercises that can be done safely at home . Encouraged safe movement . Patient provided with CCM contact information and encouraged to reach out as needed . Will follow-up with patient again over the next 30 days  Patient Self Care Activities:  . Self administers medications as prescribed . Calls pharmacy for medication refills . Calls provider office for new concerns or questions   Initial goal documentation     . Increased Risk For Falls (pt-stated)       "I don't want to fall"  Current Barriers:  [name] Knowledge Deficits related to fall precautions  Nurse Case Manager Clinical Goal(s):  [name] Over the next 30 days, patient will demonstrate improved adherence to prescribed treatment plan for decreasing falls as evidenced by patient reporting and review of EMR . Over the next 30days, patient will verbalize using fall risk reduction strategies discussed . Over the next 90 days, patient will not experience additional falls  Interventions:  . Chart reviewed . Engineer, civil (consulting) mailed to patient . Plan to follow-up with patient by telephone over the next 30 days  Patient Self Care Activities:  . Utilize walker (assistive device) appropriately with all ambulation . De-clutter walkways . Change positions slowly . Wear secure fitting shoes at all times with ambulation . Utilize home lighting for dim lit areas  Plan: . CCM RN CM will follow up in 15 days   Please see past updates related to this goal by clicking on the "Past Updates" button in the selected goal         Follow up Plan The care management team will reach out to the patient again over the next 30 days.    [name], BSN, RN-BC Embedded Chronic Care Manager [facility] [place] [facility] Direct Dial: [phone]

## 2019-03-22 NOTE — Patient Instructions (Addendum)
8 Easy Exercises You Can Do Sitting Down  Got a chair? Then you're ready for this sit-down, total-body workout!.  Safety precaution: Pay attention to your body during the movements - if anything hurts or causes pain, stop immediately. And check with your doctor first before beginning this, or any, exercise program.   Sunshine Arm Circles Seated in a chair with good posture, hold a ball in both hands with arms extended above your head and/or in front of you, keeping elbows slightly bent. Visualizing the face of a clock out in front of you, begin by holding arms up overhead at 12 o'clock. Circle the ball around to go all the way around the clock in a controlled, fluid motion. When you've reached 12 o'clock again, reverse directions and circle the opposite way. Keep alternating circle directions for 8 repetitions. Rest. Do another set of 8 repetitions.  Modification: A ball is not required for this exercise. Imagine that you are holding a ball while performing the motion. If it is difficult to bring your arms overhead, extend them out in front of you and move arms as if drawing a circle on the wall with or without the ball.   Tummy Twists Seated in a chair with good posture, hold a ball with both hands close to the body, with elbows bent and pulled in close to the ribcage. Slowly rotate your torso to the right as far as you comfortably can, being sure to keep the rest of your body still and stable. Rotate back to the center and repeat in the opposite direction. Do this 8 times, with two twists counting as a full set. Rest. Do another 8 sets (two twists each). Modification: A ball is not required for this exercise. Imagine you are holding a ball while performing the motion, or hold a small object such as a can of soup or water bottle to add resistance   Ball Chest Press Seated in a chair with good posture, hold a ball with both hands at chest level, palms facing toward each other  and elbows bent. Avoid bending forward by keeping your shoulders back at all times. Squeeze the ball slightly as you push the ball away from you in a fluid motion, taking about 2 seconds to extend the arms. Squeeze your shoulder blades together as you pull the ball back toward your chest. Repeat the push and pull motion 10 to 15 times. Rest. Do another set of 10 to 15 repetitions.  Modification: For a greater challenge, add a Tai Chi feel by standing with one leg slightly in front of the other (with a chair nearby if needed for extra balance) and slowly rocking the entire body forward and back as you push the ball away and pull back in.     Front Arm Raises In a seated position with good posture, hold a ball in both hands with palms facing each other. Extend the arms out in front of your body, keeping your elbows slightly bent. Starting with the ball lowered toward the knees, slowly raise your arms to lift the ball up to shoulder level (no higher), then lower the ball back to the starting position, taking about 2 to 3 seconds to lift and lower. Repeat 10 to 15 times. Rest. Do another set of 10 to 15 repetitions. Modification: A ball is not required for this exercise. Imagine you are holding a ball as you perform the motion, or hold a small object, such as a can of soup or  water bottle for added resistance.        Inner Thigh Squeezes Sitting toward the edge of a chair with good posture and knees bent, place a ball in between your knees; press the knees together to squeeze the ball, taking about 1 to 2 seconds to squeeze. You should feel the resistance in your inner thighs. Slowly release, keeping slight tension on the ball so that it does not fall. Repeat 8 to 10 times. Rest. Do another set of 8 to 10 repetitions. Modification: For a greater challenge, change the count of the squeezes by squeezing the ball and holding for 5 seconds, then releasing again. Or, do short, quick  pulsing squeezes.     Knee Extensions Sitting toward the edge of a chair with good posture and bent knees, hold on to the sides of the chair with your hands. Extend the right knee out so that the toes come up toward the ceiling, being sure to keep the knee slightly bent without locking it through the entire movement. Lower the leg back to a bent position and repeat this movement 8 to 10 times, using about 2 seconds each to lift and lower the leg. Switch to the opposite leg and perform 8 to 10 repetitions. Rest briefly. Do another set of 8 to 10 repetitions for each leg. Modification: If you are more advanced, sitting in the same position as above, extend one leg out in front of you with toes pointed to the ceiling. Lift and lower the entire leg only as high as you comfortably can, keeping the knee slightly bent. The longer lever adds difficulty to the exercise.   Elbow to Knee Seated toward the edge of a chair with good posture and knees bent, start with your right arm extended up overhead. Slowly lift the left knee up as you lower your right elbow down toward your left knee, taking about 2 seconds to lower down. Try not to bend over at the waist. Release and go back to the starting position. Repeat 8 to 10 times. Switch sides and do 8 to 10 repetitions, pulling one elbow to the opposite knee. Rest. Do another set of 8 to 10 repetitions on each side. Modification: Try this (with a chair nearby for balance) exercise in a standing position for an increased range of motion.     Overhead Arm Extensions Seated in a chair with good posture, hold a ball with both hands and raise it up over your head, with arms extended without locking the elbows. Keeping the elbows pulled in toward the head, slowly bend the elbows to lower the ball down along the back of the neck, using about 2 seconds to go down, then 2 seconds to push the ball back up over your head. Repeat 8 to 10 times. Rest. Do  another set of 8 to 10 repetitions. Modification: Try seated tricep extensions (ball not required for this modification). Bending slightly forward with elbows tucked into your sides, slowly extend the elbows so that your forearms go back behind you, keeping the elbows pulled up and in for the entire movement. Return to the starting position and repeat. Hold soup cans or small weights for added resistance.    Fall Prevention in the Home, Adult Falls can cause injuries. They can happen to people of all ages. There are many things you can do to make your home safe and to help prevent falls. Ask for help when making these changes, if needed. What actions can I  take to prevent falls? General Instructions  Use good lighting in all rooms. Replace any light bulbs that burn out.  Turn on the lights when you go into a dark area. Use night-lights.  Keep items that you use often in easy-to-reach places. Lower the shelves around your home if necessary.  Set up your furniture so you have a clear path. Avoid moving your furniture around.  Do not have throw rugs and other things on the floor that can make you trip.  Avoid walking on wet floors.  If any of your floors are uneven, fix them.  Add color or contrast paint or tape to clearly mark and help you see: ? Any grab bars or handrails. ? First and last steps of stairways. ? Where the edge of each step is.  If you use a stepladder: ? Make sure that it is fully opened. Do not climb a closed stepladder. ? Make sure that both sides of the stepladder are locked into place. ? Ask someone to hold the stepladder for you while you use it.  If there are any pets around you, be aware of where they are. What can I do in the bathroom?      Keep the floor dry. Clean up any water that spills onto the floor as soon as it happens.  Remove soap buildup in the tub or shower regularly.  Use non-skid mats or decals on the floor of the tub or shower.   Attach bath mats securely with double-sided, non-slip rug tape.  If you need to sit down in the shower, use a plastic, non-slip stool.  Install grab bars by the toilet and in the tub and shower. Do not use towel bars as grab bars. What can I do in the bedroom?  Make sure that you have a light by your bed that is easy to reach.  Do not use any sheets or blankets that are too big for your bed. They should not hang down onto the floor.  Have a firm chair that has side arms. You can use this for support while you get dressed. What can I do in the kitchen?  Clean up any spills right away.  If you need to reach something above you, use a strong step stool that has a grab bar.  Keep electrical cords out of the way.  Do not use floor polish or wax that makes floors slippery. If you must use wax, use non-skid floor wax. What can I do with my stairs?  Do not leave any items on the stairs.  Make sure that you have a light switch at the top of the stairs and the bottom of the stairs. If you do not have them, ask someone to add them for you.  Make sure that there are handrails on both sides of the stairs, and use them. Fix handrails that are broken or loose. Make sure that handrails are as long as the stairways.  Install non-slip stair treads on all stairs in your home.  Avoid having throw rugs at the top or bottom of the stairs. If you do have throw rugs, attach them to the floor with carpet tape.  Choose a carpet that does not hide the edge of the steps on the stairway.  Check any carpeting to make sure that it is firmly attached to the stairs. Fix any carpet that is loose or worn. What can I do on the outside of my home?  Use bright outdoor lighting.  Regularly fix the edges of walkways and driveways and fix any cracks.  Remove anything that might make you trip as you walk through a door, such as a raised step or threshold.  Trim any bushes or trees on the path to your home.   Regularly check to see if handrails are loose or broken. Make sure that both sides of any steps have handrails.  Install guardrails along the edges of any raised decks and porches.  Clear walking paths of anything that might make someone trip, such as tools or rocks.  Have any leaves, snow, or ice cleared regularly.  Use sand or salt on walking paths during winter.  Clean up any spills in your garage right away. This includes grease or oil spills. What other actions can I take?  Wear shoes that: ? Have a low heel. Do not wear high heels. ? Have rubber bottoms. ? Are comfortable and fit you well. ? Are closed at the toe. Do not wear open-toe sandals.  Use tools that help you move around (mobility aids) if they are needed. These include: ? Canes. ? Walkers. ? Scooters. ? Crutches.  Review your medicines with your doctor. Some medicines can make you feel dizzy. This can increase your chance of falling. Ask your doctor what other things you can do to help prevent falls. Where to find more information  Centers for Disease Control and Prevention, STEADI: https://garcia.biz/  [employer] on Aging: BrainJudge.co.uk Contact a doctor if:  You are afraid of falling at home.  You feel weak, drowsy, or dizzy at home.  You fall at home. Summary  There are many simple things that you can do to make your home safe and to help prevent falls.  Ways to make your home safe include removing tripping hazards and installing grab bars in the bathroom.  Ask for help when making these changes in your home. This information is not intended to replace advice given to you by your health care provider. Make sure you discuss any questions you have with your health care provider. Document Released: 02/15/2009 Document Revised: 08/12/2018 Document Reviewed: 12/04/2016 Elsevier Patient Education  2020 [employer].   Visit Information  RN Care Plan  . Improved Mobility        Current Barriers:  Marland Kitchen Knowledge Deficits related to strength training exercises . Fear of injury or inability to physically perform exercises . Lacks caregiver support.   Nurse Case Manager Clinical Goal(s):  Marland Kitchen Over the next 7 days, patient will verbalize understanding of plan for strength training . Over the next 30 days, patient will work with Consulting civil engineer to address needs related to physical deconditioning . Over the next 60 days, patient will work with [place] Therapist (community agency) to increase strength and balance.  Interventions:  . Printed and mailed written educational materials on seated exercises that can be done safely at home . Encouraged safe movement . Patient provided with CCM contact information and encouraged to reach out as needed . Will follow-up with patient again over the next 30 days  Patient Self Care Activities:  . Self administers medications as prescribed . Calls pharmacy for medication refills . Calls provider office for new concerns or questions    . Decrease Risk For Falls       "I don't want to fall"  Current Barriers:  Marland Kitchen Knowledge Deficits related to fall precautions  Nurse Case Manager Clinical Goal(s):  Marland Kitchen Over the next 30 days, patient will  demonstrate improved adherence to prescribed treatment plan for decreasing falls as evidenced by patient reporting and review of EMR . Over the next 30days, patient will verbalize using fall risk reduction strategies discussed . Over the next 90 days, patient will not experience additional falls  Interventions:  . Chart reviewed . Engineer, civil (consulting) mailed to patient . Plan to follow-up with patient by telephone over the next 30 days  Patient Self Care Activities:  . Utilize walker (assistive device) appropriately with all ambulation . De-clutter walkways . Change positions slowly . Wear secure fitting shoes at all times with ambulation . Utilize home lighting for dim lit  areas  Plan: . CCM RN CM will follow up in 15 days        Print copy of patient instructions provided by mail.  The care management team will reach out to the patient again over the next 30 days.   Chong Sicilian, BSN, RN-BC Embedded Chronic Care Manager [facility] / [place] [facility] Direct Dial: [phone]

## 2019-03-24 DIAGNOSIS — B351 Tinea unguium: Secondary | ICD-10-CM | POA: Diagnosis not present

## 2019-03-24 DIAGNOSIS — L84 Corns and callosities: Secondary | ICD-10-CM | POA: Diagnosis not present

## 2019-03-24 DIAGNOSIS — M79676 Pain in unspecified toe(s): Secondary | ICD-10-CM | POA: Diagnosis not present

## 2019-03-24 DIAGNOSIS — E1142 Type 2 diabetes mellitus with diabetic polyneuropathy: Secondary | ICD-10-CM | POA: Diagnosis not present

## 2019-03-28 ENCOUNTER — Telehealth: Payer: PPO

## 2019-03-29 ENCOUNTER — Ambulatory Visit (INDEPENDENT_AMBULATORY_CARE_PROVIDER_SITE_OTHER): Payer: PPO | Admitting: Licensed Clinical Social Worker

## 2019-03-29 DIAGNOSIS — M8949 Other hypertrophic osteoarthropathy, multiple sites: Secondary | ICD-10-CM

## 2019-03-29 DIAGNOSIS — Z853 Personal history of malignant neoplasm of breast: Secondary | ICD-10-CM

## 2019-03-29 DIAGNOSIS — Z604 Social exclusion and rejection: Secondary | ICD-10-CM

## 2019-03-29 DIAGNOSIS — E1140 Type 2 diabetes mellitus with diabetic neuropathy, unspecified: Secondary | ICD-10-CM | POA: Diagnosis not present

## 2019-03-29 DIAGNOSIS — I5032 Chronic diastolic (congestive) heart failure: Secondary | ICD-10-CM

## 2019-03-29 DIAGNOSIS — I4891 Unspecified atrial fibrillation: Secondary | ICD-10-CM | POA: Diagnosis not present

## 2019-03-29 DIAGNOSIS — E1159 Type 2 diabetes mellitus with other circulatory complications: Secondary | ICD-10-CM | POA: Diagnosis not present

## 2019-03-29 DIAGNOSIS — I10 Essential (primary) hypertension: Secondary | ICD-10-CM | POA: Diagnosis not present

## 2019-03-29 DIAGNOSIS — J8410 Pulmonary fibrosis, unspecified: Secondary | ICD-10-CM

## 2019-03-29 DIAGNOSIS — Z794 Long term (current) use of insulin: Secondary | ICD-10-CM | POA: Diagnosis not present

## 2019-03-29 DIAGNOSIS — K219 Gastro-esophageal reflux disease without esophagitis: Secondary | ICD-10-CM

## 2019-03-29 DIAGNOSIS — M159 Polyosteoarthritis, unspecified: Secondary | ICD-10-CM

## 2019-03-29 NOTE — Chronic Care Management (AMB) (Signed)
Care Management Note   [name] is a 85 y.o. year old female who is a primary care patient of Baruch Gouty, FNP. The CM team was consulted for assistance with chronic disease management and care coordination.   I reached out to [name] by phone today.   Review of patient status, including review of consultants reports, relevant laboratory and other test results, and collaboration with appropriate care team members and the patient's provider was performed as part of comprehensive patient evaluation and provision of chronic care management services.   Social determinants of health: risk of social isolation; risk of stress; risk of physical inactivity    Clinical Support from 11/10/2018 in [place]  PHQ-9 Total Score  3     Medications    apixaban (ELIQUIS) 5 MG TABS tablet    Cholecalciferol (VITAMIN D3) 5000 units CAPS    D-Mannose 500 MG CAPS    glucose blood (ONE TOUCH ULTRA TEST) test strip    hydrALAZINE (APRESOLINE) 25 MG tablet    hydrALAZINE (APRESOLINE) 50 MG tablet    Insulin Glargine (LANTUS SOLOSTAR) 100 UNIT/ML Solostar Pen    Insulin Pen Needle 31G X 8 MM MISC    Magnesium 250 MG TABS    metFORMIN (GLUCOPHAGE) 500 MG tablet    nabumetone (RELAFEN) 500 MG tablet    nystatin cream (MYCOSTATIN)    omeprazole (PRILOSEC) 20 MG capsule    ondansetron (ZOFRAN) 4 MG tablet    rOPINIRole (REQUIP) 0.25 MG tablet    SUPER B COMPLEX/C PO    Goals        . "I wish I could get out of the house and go to the grocery store" (pt-stated)     Current Barriers:   Lacks caregiver support.    Social isolation due to [place] Stay at [facility] Orders  Nurse Case Manager Clinical Goal(s):  Marland Kitchen Over the next 30 days, patient will work with CM clinical social worker to address psychosocial issues related to social isolation.   Interventions:  . Advised patient to talk with friends/family on the phone often for social/emotional support.  .  Suggested that patient make a quick trip to the grocery store once or twice a week.  o She has an N95 mask. I advised her to wear it. o Only go in the store if it is not crowded o Limit distance from other people to 6 feet or more o Wash hands often and thoroughly o Talked with client about calling in grocery order to local [place] store and then just driving to store to pay for ordered food and to pick it up  Collaborated with RNCM related to client needs.   Patient encouraged to reach out with any medical or psychosocial needs  Provided counseling support for client  Talked with client about social support network of client (son, family friend)  Talked with client about sleep challenges of client  Talked with client about food procurement for client   Patient Self Care Activities:  . Self administers medications as prescribed . Attends all scheduled provider appointments . Calls pharmacy for medication refills . Attends church or other social activities . Performs ADL's independently . Performs IADL's independently . Calls provider office for new concerns or questions  Initial goal documentation       Follow Up Plan: [name] to call client in next 3 weeks to talk with client about the psychosocial needs of client at that time  Legrand Como  S.Kourtnee Lahey MSW, [name] Licensed Clinical Social Worker [facility] [phone]

## 2019-03-29 NOTE — Patient Instructions (Addendum)
Licensed Clinical Social Worker Visit Information  Goals we discussed today:  Goals        . "I wish I could get out of the house and go to the grocery store" (pt-stated)     Current Barriers:   Lacks caregiver support.    Social isolation due to [place] Stay at [facility] Orders  Nurse Case Manager Clinical Goal(s):  Marland Kitchen Over the next 30 days, patient will work with CM clinical social worker to address psychosocial issues related to social isolation.   Interventions:  . Advised patient to talk with friends/family on the phone often for social/emotional support.  . Suggested that patient make a quick trip to the grocery store once or twice a week.  o She has an N95 mask. I advised her to wear it. o Only go in the store if it is not crowded o Limit distance from other people to 6 feet or more o Wash hands often and thoroughly o Talked with client about calling in grocery order to local [place] store and then just driving to store to pay for ordered food and to pick it up . Patient encouraged to reach out with any medical or psychosocial needs  Collaboratedwith RNCM related to client needs.   Provided counseling support for client  Talked with client about social support network of client (son, family friend)  Talked with client about sleep challenges of client  Talked with client about food procurement for client  Patient Self Care Activities:  . Self administers medications as prescribed . Attends all scheduled provider appointments . Calls pharmacy for medication refills . Attends church or other social activities . Performs ADL's independently . Performs IADL's independently . Calls provider office for new concerns or questions  Initial goal documentation        Materials Provided: No  Follow Up Plan: [name] to call client in next 3 weeks to talk with client about the psychosocial needs of client at that time  The patient verbalized understanding of instructions  provided today and declined a print copy of patient instruction materials.   [name].[name] MSW, [name] Licensed Clinical Social Worker [facility] Care [facility] [phone]

## 2019-04-05 ENCOUNTER — Ambulatory Visit: Payer: PPO | Admitting: *Deleted

## 2019-04-05 NOTE — Chronic Care Management (AMB) (Signed)
  Chronic Care Management   Follow Up Note  04/05/2019 Name: [name] MRN: [id number] DOB: [birth year]  Unsuccessful telephone follow up attempt.   Follow up plan: RN will reach out by telephone again over the next 2 days HIPAA compliant voicemail left  Chong Sicilian, BSN, RN-BC [place] / [place] Management Direct Dial: [phone]

## 2019-04-08 ENCOUNTER — Ambulatory Visit: Payer: PPO | Admitting: *Deleted

## 2019-04-08 DIAGNOSIS — E1159 Type 2 diabetes mellitus with other circulatory complications: Secondary | ICD-10-CM

## 2019-04-08 DIAGNOSIS — M159 Polyosteoarthritis, unspecified: Secondary | ICD-10-CM

## 2019-04-19 ENCOUNTER — Ambulatory Visit: Payer: PPO | Admitting: *Deleted

## 2019-04-19 DIAGNOSIS — Z7901 Long term (current) use of anticoagulants: Secondary | ICD-10-CM | POA: Insufficient documentation

## 2019-04-19 DIAGNOSIS — I4891 Unspecified atrial fibrillation: Secondary | ICD-10-CM

## 2019-04-19 NOTE — Chronic Care Management (AMB) (Signed)
Chronic Care Management   Care Coordination Note   04/19/2019 Name: [name] MRN: [id number] DOB: Sep 25, 1925  Referred by: Baruch Gouty, FNP Reason for referral : Chronic Care Management (Care Coordination)   [name] is a 85 y.o. year old female who is a primary care patient of Baruch Gouty, FNP. The CCM team was consulted for assistance with chronic disease management and care coordination needs.    Review of patient status, including review of consultants reports, relevant laboratory and other test results, and collaboration with appropriate care team members and the patient's provider was performed as part of comprehensive patient evaluation and provision of chronic care management services.    Care coordination pertaining to patient assistance through [place] (BMS) for Eliquis.   Outpatient Encounter Medications as of 04/19/2019  Medication Sig  . apixaban (ELIQUIS) 5 MG TABS tablet Take 1 tablet (5 mg total) by mouth 2 (two) times daily.  . Cholecalciferol (VITAMIN D3) 5000 units CAPS Take 2,000 Units by mouth daily.   . D-Mannose 500 MG CAPS Take 500 mg by mouth daily.  Marland Kitchen glucose blood (ONE TOUCH ULTRA TEST) test strip USE TO check blood sugars twice daily  . hydrALAZINE (APRESOLINE) 25 MG tablet Take 1 tablet (25 mg total) by mouth 2 (two) times daily.  . hydrALAZINE (APRESOLINE) 50 MG tablet Take 1 tablet (50 mg total) by mouth 2 (two) times daily.  . Insulin Glargine (LANTUS SOLOSTAR) 100 UNIT/ML Solostar Pen INJECT 50 UNITS ONCE DAILY AT 10PM  . Insulin Pen Needle 31G X 8 MM MISC Use once daily with lantus solostar  . Magnesium 250 MG TABS Take 250 mg by mouth daily.  . metFORMIN (GLUCOPHAGE) 500 MG tablet TAKE (1) TABLET DAILY WITH BREAKFAST.  . nabumetone (RELAFEN) 500 MG tablet Take 1 tablet (500 mg total) by mouth 2 (two) times daily. For muscleand joint pain  . nystatin cream (MYCOSTATIN) Apply 1 application topically 2 (two) times  daily.  Marland Kitchen omeprazole (PRILOSEC) 20 MG capsule TAKE  (1)  CAPSULE  TWICE DAILY (TAKE ON AN EMPTY STOMACH AT LEAST 30MIN- UTES BEFORE MEALS).  Marland Kitchen ondansetron (ZOFRAN) 4 MG tablet TAKE 1 TABLET EVERY 8 HOURS AS NEEDED FOR NAUSEA & VOMITING  . rOPINIRole (REQUIP) 0.25 MG tablet Take 1 tablet (0.25 mg total) by mouth 3 (three) times daily. Take one tablet in the morning, one tablet at noon and two tablets at bedtime.  . SUPER B COMPLEX/C PO Take 1 tablet by mouth daily.   No facility-administered encounter medications on file as of 04/19/2019.     RN Care Plan   . Prescription Assistance        Current Barriers:  . Film/video editor.   Nurse Case Manager Clinical Goal(s):  Marland Kitchen Over the next 15 days, patient will work with Consulting civil engineer to complete paperwork for prescription assistance for Eliquis  Interventions:  . Chart reviewed . Reviewed forms completed by patient for patient assistance through BMS . Collaborated with PCP clinical team to complete provider's portion . Faxed completed forms for BMS on patient's behalf . Patient made aware that she should be contacted by BMS regarding acceptance into patient assistance program  Patient Self Care Activities:  . Performs ADL's independently . Performs IADL's independently   Initial goal documentation        Follow-up Plan The care management team will reach out to the patient again over the next 30 days.    Chong Sicilian, BSN,  RN-BC Embedded Chronic Care Manager [place] / [place] [facility] Direct Dial: [phone]

## 2019-04-19 NOTE — Patient Instructions (Signed)
Visit Information  Goals Addressed            This Visit's Progress     Patient Stated   . Prescription Assistance (pt-stated)       Current Barriers:  . Film/video editor.   Nurse Case Manager Clinical Goal(s):  Marland Kitchen Over the next 15 days, patient will work with Consulting civil engineer to complete paperwork for prescription assistance for Eliquis  Interventions:  . Chart reviewed . Reviewed forms completed by patient for patient assistance through BMS . Collaborated with PCP clinical team to complete provider's portion . Faxed completed forms for BMS on patient's behalf . Patient made aware that she should be contacted by BMS regarding acceptance into patient assistance program  Patient Self Care Activities:  . Performs ADL's independently . Performs IADL's independently   Initial goal documentation       The care management team will reach out to the patient again over the next 30 days.    Chong Sicilian, BSN, RN-BC Embedded Chronic Care Manager [facility] / [place] [facility] Direct Dial: [phone]

## 2019-04-20 ENCOUNTER — Ambulatory Visit: Payer: Self-pay | Admitting: Licensed Clinical Social Worker

## 2019-04-20 DIAGNOSIS — E1140 Type 2 diabetes mellitus with diabetic neuropathy, unspecified: Secondary | ICD-10-CM

## 2019-04-20 DIAGNOSIS — M159 Polyosteoarthritis, unspecified: Secondary | ICD-10-CM

## 2019-04-20 DIAGNOSIS — M797 Fibromyalgia: Secondary | ICD-10-CM

## 2019-04-20 DIAGNOSIS — E1169 Type 2 diabetes mellitus with other specified complication: Secondary | ICD-10-CM

## 2019-04-20 DIAGNOSIS — M150 Primary generalized (osteo)arthritis: Secondary | ICD-10-CM

## 2019-04-20 DIAGNOSIS — E1159 Type 2 diabetes mellitus with other circulatory complications: Secondary | ICD-10-CM

## 2019-04-20 DIAGNOSIS — K219 Gastro-esophageal reflux disease without esophagitis: Secondary | ICD-10-CM

## 2019-04-20 DIAGNOSIS — Z794 Long term (current) use of insulin: Secondary | ICD-10-CM

## 2019-04-20 NOTE — Chronic Care Management (AMB) (Signed)
  Care Management Note   [name] is a 85 y.o. year old female who is a primary care patient of Baruch Gouty, FNP. The CM team was consulted for assistance with chronic disease management and care coordination.   I reached out to [name] by phone today.    Review of patient status, including review of consultants reports, relevant laboratory and other test results, and collaboration with appropriate care team members and the patient's provider was performed as part of comprehensive patient evaluation and provision of chronic care management services.   Social determinants of health: risk of social isolation; risk of stress; risk of physical inactivity    Clinical Support from 11/10/2018 in [place]  PHQ-9 Total Score  3     Medications   (very important)  New medications from outside sources are available for reconciliation  apixaban (ELIQUIS) 5 MG TABS tablet Cholecalciferol (VITAMIN D3) 5000 units CAPS D-Mannose 500 MG CAPS glucose blood (ONE TOUCH ULTRA TEST) test strip hydrALAZINE (APRESOLINE) 25 MG tablet(Expired) hydrALAZINE (APRESOLINE) 50 MG tablet(Expired) Insulin Glargine (LANTUS SOLOSTAR) 100 UNIT/ML Solostar Pen Insulin Pen Needle 31G X 8 MM MISC Magnesium 250 MG TABS metFORMIN (GLUCOPHAGE) 500 MG tablet nabumetone (RELAFEN) 500 MG tablet nystatin cream (MYCOSTATIN) omeprazole (PRILOSEC) 20 MG capsule ondansetron (ZOFRAN) 4 MG tablet rOPINIRole (REQUIP) 0.25 MG tablet(Expired) SUPER B COMPLEX/C PO  Goals Addressed            This Visit's Progress   . "I wish I could get out of the house and go to the grocery store" (pt-stated)       Current Barriers:   Lacks caregiver support.    Social isolation in patient with Chronic Diagnoses of Fibromyalgia, Type 2 DM, Hyperlipidemia, Osteoarthritis, GERD, and HTN   Nurse Case Manager Clinical Goal(s):  Marland Kitchen Over the next 30 days, patient will work with CM clinical social  worker to address psychosocial issues related to social isolation.   Interventions:  . Advised patient to talk with friends/family on the phone often for social/emotional support.  . Suggested that patient make a quick trip to the grocery store once or twice a week.  o She has an N95 mask. I advised her to wear it. o Only go in the store if it is not crowded o Limit distance from other people to 6 feet or more o Wash hands often and thoroughly o Talked with client about calling in grocery order to local [place] store and then just driving to store to pay for ordered food and to pick it up . Previously collaborated with RNCM related to client needs.  . Patient encouraged to reach out with any medical or psychosocial needs . Provided counseling support for client . Talked with Fraser Din about ambulation challenges of client . Talked with client about medication procurement for client  Patient Self Care Activities:  . Self administers medications as prescribed . Attends all scheduled provider appointments . Calls pharmacy for medication refills . Attends church or other social activities . Performs ADL's independently . Performs IADL's independently . Calls provider office for new concerns or questions  Initial goal documentation       Follow Up Plan:  [name] to call client in next 4 weeks to assess the psychosocial needs of client at that time  Norva Riffle.Tyona Nilsen MSW, [name] Licensed Clinical Social Worker [facility] Care Management [phone]

## 2019-04-20 NOTE — Patient Instructions (Addendum)
Licensed Clinical Social Worker Visit Information  Goals we discussed today:  Goals Addressed            This Visit's Progress   . "I wish I could get out of the house and go to the grocery store" (pt-stated)       Current Barriers:   Lacks caregiver support.    Social isolation in patient with Chronic Diagnoses of Fibromyalgia, Type 2 DM, Hyperlipidemia, Osteoarthritis, GERD, and HTN   Nurse Case Manager Clinical Goal(s):  Marland Kitchen Over the next 30 days, patient will work with CM clinical social worker to address psychosocial issues related to social isolation.   Interventions:  . Advised patient to talk with friends/family on the phone often for social/emotional support.  . Suggested that patient make a quick trip to the grocery store once or twice a week.  o She has an N95 mask. I advised her to wear it. o Only go in the store if it is not crowded o Limit distance from other people to 6 feet or more o Wash hands often and thoroughly o Talked with client about calling in grocery order to local [place] store and then just driving to store to pay for ordered food and to pick it up . Previously collaborated with RNCM related to client needs.  . Patient encouraged to reach out with any medical or psychosocial needs . Provided counseling support for client  Talked with [place] about ambulation challenges of client  Talked with client about medication procurement for client  Patient Self Care Activities:  . Self administers medications as prescribed . Attends all scheduled provider appointments . Calls pharmacy for medication refills . Attends church or other social activities . Performs ADL's independently . Performs IADL's independently . Calls provider office for new concerns or questions  Initial goal documentation          Materials Provided: No  Follow Up Plan: [name] to call client in next 4 weeks to talk with client about the psychosocial needs of client at that  time  The patient verbalized understanding of instructions provided today and declined a print copy of patient instruction materials.   Norva Riffle.Davell Beckstead MSW, [name] Licensed Clinical Social Worker [facility] [phone]

## 2019-04-21 ENCOUNTER — Ambulatory Visit: Payer: PPO | Admitting: *Deleted

## 2019-04-21 DIAGNOSIS — I4891 Unspecified atrial fibrillation: Secondary | ICD-10-CM

## 2019-04-22 NOTE — Chronic Care Management (AMB) (Signed)
  Chronic Care Management   Follow Up Note   04/21/2019 Name: [name] MRN: [id number] DOB: 11/21/1925  Referred by: Baruch Gouty, FNP Reason for referral : Chronic Care Management (RN follow up)   [name] is a 85 y.o. year old female who is a primary care patient of Baruch Gouty, FNP. The CCM team was consulted for assistance with chronic disease management and care coordination needs.    Review of patient status, including review of consultants reports, relevant laboratory and other test results, and collaboration with appropriate care team members and the patient's provider was performed as part of comprehensive patient evaluation and provision of chronic care management services.    I spoke with Ms [name] by telephone today.   Outpatient Encounter Medications as of 04/21/2019  Medication Sig  . apixaban (ELIQUIS) 5 MG TABS tablet Take 1 tablet (5 mg total) by mouth 2 (two) times daily.  . Cholecalciferol (VITAMIN D3) 5000 units CAPS Take 2,000 Units by mouth daily.   . D-Mannose 500 MG CAPS Take 500 mg by mouth daily.  Marland Kitchen glucose blood (ONE TOUCH ULTRA TEST) test strip USE TO check blood sugars twice daily  . Insulin Glargine (LANTUS SOLOSTAR) 100 UNIT/ML Solostar Pen INJECT 50 UNITS ONCE DAILY AT 10PM  . Insulin Pen Needle 31G X 8 MM MISC Use once daily with lantus solostar  . Magnesium 250 MG TABS Take 250 mg by mouth daily.  . metFORMIN (GLUCOPHAGE) 500 MG tablet TAKE (1) TABLET DAILY WITH BREAKFAST.  . nabumetone (RELAFEN) 500 MG tablet Take 1 tablet (500 mg total) by mouth 2 (two) times daily. For muscleand joint pain  . nystatin cream (MYCOSTATIN) Apply 1 application topically 2 (two) times daily.  Marland Kitchen omeprazole (PRILOSEC) 20 MG capsule TAKE  (1)  CAPSULE  TWICE DAILY (TAKE ON AN EMPTY STOMACH AT LEAST 30MIN- UTES BEFORE MEALS).  Marland Kitchen ondansetron (ZOFRAN) 4 MG tablet TAKE 1 TABLET EVERY 8 HOURS AS NEEDED FOR NAUSEA & VOMITING  . SUPER B COMPLEX/C PO Take 1  tablet by mouth daily.   No facility-administered encounter medications on file as of 04/21/2019.     RN Care Plan   . Prescription Assistance        Current Barriers:  . Film/video editor.   Nurse Case Manager Clinical Goal(s):  Marland Kitchen Over the next 15 days, patient will work with Consulting civil engineer to complete paperwork for prescription assistance for Eliquis  Interventions:  . Talked with patient by telephone . Answered questions regarding patient assistance program for eliquis through BMS . Advised that Eliquis 5mg  samples are available for pickup at the front desk   Patient Self Care Activities:  . Performs ADL's independently . Performs IADL's independently   Please see past updates related to this goal by clicking on the "Past Updates" button in the selected goal         Follow-up Plan The care management team will reach out to the patient again over the next 30 days.    Chong Sicilian, BSN, RN-BC Embedded Chronic Care Manager [facility] / [place] [facility] Direct Dial: [phone]

## 2019-04-22 NOTE — Patient Instructions (Signed)
Visit Information  Goals Addressed            This Visit's Progress     Patient Stated   . Prescription Assistance (pt-stated)       Current Barriers:  . Film/video editor.   Nurse Case Manager Clinical Goal(s):  Marland Kitchen Over the next 15 days, patient will work with Consulting civil engineer to complete paperwork for prescription assistance for Eliquis  Interventions:  . Talked with patient by telephone . Answered questions regarding patient assistance program for eliquis through BMS . Advised that Eliquis 5mg  samples are available for pickup at the front desk   Patient Self Care Activities:  . Performs ADL's independently . Performs IADL's independently   Please see past updates related to this goal by clicking on the "Past Updates" button in the selected goal        The care management team will reach out to the patient again over the next 30 days.   Chong Sicilian, BSN, RN-BC Embedded Chronic Care Manager [facility] / [place] [facility] Direct Dial: [phone]   The patient verbalized understanding of instructions provided today and declined a print copy of patient instruction materials.

## 2019-04-28 ENCOUNTER — Ambulatory Visit (INDEPENDENT_AMBULATORY_CARE_PROVIDER_SITE_OTHER): Payer: PPO | Admitting: *Deleted

## 2019-04-28 DIAGNOSIS — I4891 Unspecified atrial fibrillation: Secondary | ICD-10-CM | POA: Diagnosis not present

## 2019-04-28 NOTE — Patient Instructions (Signed)
Visit Information  Goals Addressed            This Visit's Progress     Patient Stated   . COMPLETED: Prescription Assistance (pt-stated)       Current Barriers:  . Film/video editor.   Nurse Case Manager Clinical Goal(s):  Marland Kitchen Over the next 15 days, patient will work with Consulting civil engineer to complete paperwork for prescription assistance for Eliquis  Interventions:  . Received incoming call from patient o Patient advised that she was approved for Eliquis Rx assistance for year 2021 . No further action needed on this at this time  Patient Self Care Activities:  . Performs ADL's independently . Performs IADL's independently   Please see past updates related to this goal by clicking on the "Past Updates" button in the selected goal   Goal Completed     The care management team will reach out to the patient again over the next 30 days.    Chong Sicilian, BSN, RN-BC Embedded Chronic Care Manager [facility] / [place] [facility] Direct Dial: [phone]   The patient verbalized understanding of instructions provided today and declined a print copy of patient instruction materials.

## 2019-04-28 NOTE — Chronic Care Management (AMB) (Signed)
Chronic Care Management   Follow Up Note   04/28/2019 Name: [name] MRN: [id number] DOB: 1926-01-20  Referred by: Baruch Gouty, FNP Reason for referral : Chronic Care Management   [name] is a 85 y.o. year old female who is a primary care patient of Baruch Gouty, FNP. The CCM team was consulted for assistance with chronic disease management and care coordination needs.    Review of patient status, including review of consultants reports, relevant laboratory and other test results, and collaboration with appropriate care team members and the patient's provider was performed as part of comprehensive patient evaluation and provision of chronic care management services.  Received incoming call from patient. She advised that she was approved for Eliquis prescription assistance for year 2021. She was very appreciative of RNs assistance with this process.      Outpatient Encounter Medications as of 04/28/2019  Medication Sig  . apixaban (ELIQUIS) 5 MG TABS tablet Take 1 tablet (5 mg total) by mouth 2 (two) times daily.  . Cholecalciferol (VITAMIN D3) 5000 units CAPS Take 2,000 Units by mouth daily.   . D-Mannose 500 MG CAPS Take 500 mg by mouth daily.  Marland Kitchen glucose blood (ONE TOUCH ULTRA TEST) test strip USE TO check blood sugars twice daily  . hydrALAZINE (APRESOLINE) 25 MG tablet Take 1 tablet (25 mg total) by mouth 2 (two) times daily.  . hydrALAZINE (APRESOLINE) 50 MG tablet Take 1 tablet (50 mg total) by mouth 2 (two) times daily.  . Insulin Glargine (LANTUS SOLOSTAR) 100 UNIT/ML Solostar Pen INJECT 50 UNITS ONCE DAILY AT 10PM  . Insulin Pen Needle 31G X 8 MM MISC Use once daily with lantus solostar  . Magnesium 250 MG TABS Take 250 mg by mouth daily.  . metFORMIN (GLUCOPHAGE) 500 MG tablet TAKE (1) TABLET DAILY WITH BREAKFAST.  . nabumetone (RELAFEN) 500 MG tablet Take 1 tablet (500 mg total) by mouth 2 (two) times daily. For muscleand joint pain  . nystatin cream  (MYCOSTATIN) Apply 1 application topically 2 (two) times daily.  Marland Kitchen omeprazole (PRILOSEC) 20 MG capsule TAKE  (1)  CAPSULE  TWICE DAILY (TAKE ON AN EMPTY STOMACH AT LEAST 30MIN- UTES BEFORE MEALS).  Marland Kitchen ondansetron (ZOFRAN) 4 MG tablet TAKE 1 TABLET EVERY 8 HOURS AS NEEDED FOR NAUSEA & VOMITING  . rOPINIRole (REQUIP) 0.25 MG tablet Take 1 tablet (0.25 mg total) by mouth 3 (three) times daily. Take one tablet in the morning, one tablet at noon and two tablets at bedtime.  . SUPER B COMPLEX/C PO Take 1 tablet by mouth daily.   No facility-administered encounter medications on file as of 04/28/2019.     RN Care Plan   . COMPLETED: Prescription Assistance (pt-stated)       Current Barriers:  . Film/video editor.   Nurse Case Manager Clinical Goal(s):  Marland Kitchen Over the next 15 days, patient will work with Consulting civil engineer to complete paperwork for prescription assistance for Eliquis  Interventions:  . Received incoming call from patient o Patient advised that she was approved for Eliquis Rx assistance for year 2021 . No further action needed on this at this time  Patient Self Care Activities:  . Performs ADL's independently . Performs IADL's independently   Please see past updates related to this goal by clicking on the "Past Updates" button in the selected goal   Goal Completed       Follow up Plan The care management team will reach out  to the patient again over the next 30 days.   Chong Sicilian, BSN, RN-BC Embedded Chronic Care Manager [facility] / [place] [facility] Direct Dial: [phone]

## 2019-05-04 NOTE — Chronic Care Management (AMB) (Signed)
  Chronic Care Management   Follow Up Note   04/08/2019 Name: [name] MRN: [id number] DOB: 1926/04/21  Referred by: Baruch Gouty, FNP Reason for referral : Chronic Care Management (RN follow up)   [name] is a 85 y.o. year old female who is a primary care patient of Baruch Gouty, FNP. The CCM team was consulted for assistance with chronic disease management and care coordination needs.    Review of patient status, including review of consultants reports, relevant laboratory and other test results, and collaboration with appropriate care team members and the patient's provider was performed as part of comprehensive patient evaluation and provision of chronic care management services.    I spoke with [name] by telephone today for a general CCM follow-up call. I allowed her to talk and direct the conversation.    RN Assessment & Care Plan   . Social Isolation       Current Barriers:  . Social isolation due to Covid-19 quarantine in a 85 year old patient with hypertension, Afib, CHF, DM, pulmonary fibrosis, and osteoarthritis and history of breast cancer.   Nurse Case Manager Clinical Goal(s):  Marland Kitchen Over the next 90 days, patient will continue to talk with the CCM team regularly  . Over the next 90 days, patient will continue to keep in contact with her family  Interventions:  . Talked with patient about social isolation . Recommended that she continue to talk with [name] regarding this as well  Patient Self Care Activities:  . Performs ADL's independently . Performs IADL's independently   Initial goal documentation       Follow-up Plan CCM team will reach out to patient by telephone over the next 30 days  Chong Sicilian, BSN, RN-BC [facility] [place] [facility] Direct Dial: [phone]

## 2019-05-04 NOTE — Patient Instructions (Signed)
Visit Information  Goals Addressed            This Visit's Progress   . Social Isolation       Current Barriers:  . Social isolation due to Covid-19 quarantine in a 85 year old patient with hypertension, Afib, CHF, DM, pulmonary fibrosis, and osteoarthritis and history of breast cancer.   Nurse Case Manager Clinical Goal(s):  Marland Kitchen Over the next 90 days, patient will continue to talk with the CCM team regularly  . Over the next 90 days, patient will continue to keep in contact with her family  Interventions:  . Talked with patient about social isolation . Recommended that she continue to talk with [name] regarding this as well  Patient Self Care Activities:  . Performs ADL's independently . Performs IADL's independently   Initial goal documentation        The care management team will reach out to the patient again over the next 30 days.    Chong Sicilian, BSN, RN-BC Embedded Chronic Care Manager [facility] / [place] [facility] Direct Dial: [phone]   The patient verbalized understanding of instructions provided today and declined a print copy of patient instruction materials.

## 2019-05-08 DIAGNOSIS — M25552 Pain in left hip: Secondary | ICD-10-CM | POA: Diagnosis not present

## 2019-05-10 ENCOUNTER — Telehealth: Payer: Self-pay | Admitting: Family Medicine

## 2019-05-10 NOTE — Telephone Encounter (Signed)
Spoke to pt and she has cold feet and wanted to know if there was a medication she could take to help with her circulation. Advised pt she should wear warm socks and gently rubbing her lower legs/feet could help with the blood flow as well as getting up and moving/walking as much as she is able. Pt also has a f/u appt with [facility] 06/10/19 so advised pt she could speak with [facility] at that time and pt voiced understanding.

## 2019-05-12 ENCOUNTER — Ambulatory Visit (INDEPENDENT_AMBULATORY_CARE_PROVIDER_SITE_OTHER): Payer: PPO | Admitting: Family Medicine

## 2019-05-12 DIAGNOSIS — Z5329 Procedure and treatment not carried out because of patient's decision for other reasons: Secondary | ICD-10-CM

## 2019-05-12 DIAGNOSIS — Z91199 Patient's noncompliance with other medical treatment and regimen due to unspecified reason: Secondary | ICD-10-CM

## 2019-05-12 NOTE — Progress Notes (Signed)
Attempted to call three times with no answer. Message left. No return call received.

## 2019-05-12 NOTE — Progress Notes (Signed)
Pt returned call for telephone visit and ask that provider call her. Attempted to call again at 1525 with no answer.   Monia Pouch, FNP-C [facility] [address] [place], [place] [address] [phone]

## 2019-05-13 ENCOUNTER — Telehealth: Payer: Self-pay | Admitting: *Deleted

## 2019-05-13 NOTE — Telephone Encounter (Signed)
Received reply from patient regarding February 2021 recall letter for Dr. Peter Martinique that was mailed.  Patient states she will no longer be seeing Dr. Martinique --she wants to see Dr. Bronson Ing in our [place], [place] office after Christmas.  Will inform Dr. Martinique

## 2019-05-20 ENCOUNTER — Ambulatory Visit (INDEPENDENT_AMBULATORY_CARE_PROVIDER_SITE_OTHER): Payer: PPO | Admitting: Licensed Clinical Social Worker

## 2019-05-20 ENCOUNTER — Ambulatory Visit: Payer: PPO | Admitting: *Deleted

## 2019-05-20 DIAGNOSIS — M159 Polyosteoarthritis, unspecified: Secondary | ICD-10-CM

## 2019-05-20 DIAGNOSIS — Z794 Long term (current) use of insulin: Secondary | ICD-10-CM

## 2019-05-20 DIAGNOSIS — E1140 Type 2 diabetes mellitus with diabetic neuropathy, unspecified: Secondary | ICD-10-CM

## 2019-05-20 DIAGNOSIS — K219 Gastro-esophageal reflux disease without esophagitis: Secondary | ICD-10-CM

## 2019-05-20 DIAGNOSIS — E1169 Type 2 diabetes mellitus with other specified complication: Secondary | ICD-10-CM

## 2019-05-20 DIAGNOSIS — I152 Hypertension secondary to endocrine disorders: Secondary | ICD-10-CM

## 2019-05-20 DIAGNOSIS — M150 Primary generalized (osteo)arthritis: Secondary | ICD-10-CM

## 2019-05-20 DIAGNOSIS — E1159 Type 2 diabetes mellitus with other circulatory complications: Secondary | ICD-10-CM

## 2019-05-20 DIAGNOSIS — M797 Fibromyalgia: Secondary | ICD-10-CM

## 2019-05-20 NOTE — Chronic Care Management (AMB) (Signed)
  Care Management Note   [name] is a 86 y.o. year old female who is a primary care patient of Joan Gouty, FNP. The CM team was consulted for assistance with chronic disease management and care coordination.   I reached out to [name] by phone today.     Review of patient status, including review of consultants reports, relevant laboratory and other test results, and collaboration with appropriate care team members and the patient's provider was performed as part of comprehensive patient evaluation and provision of chronic care management services.   Social determinants of health: risk of social isolation; risk of stress; risk of physical inactivity    Clinical Support from 11/10/2018 in [facility]  PHQ-9 Total Score  3     Medications   (very important)  New medications from outside sources are available for reconciliation  apixaban (ELIQUIS) 5 MG TABS tablet Cholecalciferol (VITAMIN D3) 5000 units CAPS D-Mannose 500 MG CAPS glucose blood (ONE TOUCH ULTRA TEST) test strip hydrALAZINE (APRESOLINE) 25 MG tablet(Expired) hydrALAZINE (APRESOLINE) 50 MG tablet(Expired) Insulin Glargine (LANTUS SOLOSTAR) 100 UNIT/ML Solostar Pen Insulin Pen Needle 31G X 8 MM MISC Magnesium 250 MG TABS metFORMIN (GLUCOPHAGE) 500 MG tablet nabumetone (RELAFEN) 500 MG tablet nystatin cream (MYCOSTATIN) omeprazole (PRILOSEC) 20 MG capsule ondansetron (ZOFRAN) 4 MG tablet rOPINIRole (REQUIP) 0.25 MG tablet(Expired) SUPER B COMPLEX/C PO  GOAL:      . "I wish I could get out of the house and go to the grocery store" (pt-stated)     Current Barriers:   Lacks caregiver support.    Social isolation in patient with Chronic Diagnoses of Fibromyalgia, Type 2 DM, Hyperlipidemia, Osteoarthritis, GERD, and HTN   Nurse Case Manager Clinical Goal(s):  Marland Kitchen Over the next 30 days, patient will work with CM clinical social worker to address psychosocial issues related to  social isolation.   Interventions:   Advised patient to talk with friends/family on the phone often for social/emotional support.   Suggested that patient make a quick trip to the grocery store once or twice a week.  ? She has an N95 mask. I advised her to wear it. ? Only go in the store if it is not crowded ? Limit distance from other people to 6 feet or more ? Wash hands often and thoroughly ? Talked with client about calling in grocery order to local [place] store and then just driving to store to pay for ordered food and to pick it up  Previously collaborated with [facility] related to client needs.   Patient encouraged to reach out with any medical or psychosocial needs  Provided counseling support for client  Talked with [name] about ambulation challenges of client  Talked with client about medication procurement for client  Talked with client about pain issues of client  Patient Self Care Activities:  . Self administers medications as prescribed . Attends all scheduled provider appointments . Calls pharmacy for medication refills . Attends church or other social activities . Performs ADL's independently . Performs IADL's independently . Calls provider office for new concerns or questions  Initial goal documentation        Follow Up Plan: [name] to call client in next 4 weeks to assess the psychosocial needs of client at that time  [name].Jaileen Janelle MSW, [name] Licensed Clinical Social Worker [facility] Care Management [phone]

## 2019-05-20 NOTE — Chronic Care Management (AMB) (Cosign Needed)
  Chronic Care Management   Note  05/20/2019 Name: [name] MRN: [id number] DOB: 1925/07/22  Call from Theadore Nan, [place] regarding Ms Stidd bilateral hip pain. He recommended that she contact the triage nurse at [facility] since the pain is acutely worse. I agree with that plan.   Follow up plan: Patient scheduled with RN CCM for f/u within the next 7 days  Chong Sicilian, BSN, RN-BC [place] [facility] [place] [facility] Direct Dial: [phone]

## 2019-05-20 NOTE — Patient Instructions (Addendum)
Licensed Clinical Social Worker Visit Information  Goals we discussed today:  Goals    . "I wish I could get out of the house and go to the grocery store" (pt-stated)     Current Barriers:   Lacks caregiver support.    Social isolation in patient with Chronic Diagnoses of Fibromyalgia, Type 2 DM, Hyperlipidemia, Osteoarthritis, GERD, and HTN   Nurse Case Manager Clinical Goal(s):  Marland Kitchen Over the next 30 days, patient will work with CM clinical social worker to address psychosocial issues related to social isolation.   Interventions:   Advised patient to talk with friends/family on the phone often for social/emotional support.  Suggested that patient make a quick trip to the grocery store once or twice a week.  She has an N95 mask. I advised her to wear it.  Only go in the store if it is not crowded  Limit distance from other people to 6 feet or more  Wash hands often and thoroughly  Talked with client about calling in grocery order to local [place] store and then just driving to store to pay for ordered food and to pick it up Collaborated with RNCM related to client needs.  Patient encouraged to reach out with any medical or psychosocial needs  Provided counseling support for client  Talked with [name] about ambulation challenges of client  Talked with client about medication procurement for client  Talked with client about pain issues of client  Patient Self Care Activities:  . Self administers medications as prescribed . Attends all scheduled provider appointments . Calls pharmacy for medication refills . Attends church or other social activities . Performs ADL's independently . Performs IADL's independently . Calls provider office for new concerns or questions  Initial goal documentation       Materials Provided: No  Follow Up Plan: [name] to call client in next 4 weeks to assess the psychosocial needs of client at that time  The patient verbalized understanding of  instructions provided today and declined a print copy of patient instruction materials.   [name].[name] MSW, [name] Licensed Clinical Social Worker [facility] [phone]

## 2019-05-23 ENCOUNTER — Ambulatory Visit: Payer: Self-pay | Admitting: *Deleted

## 2019-05-23 ENCOUNTER — Telehealth: Payer: Self-pay | Admitting: *Deleted

## 2019-05-23 ENCOUNTER — Other Ambulatory Visit: Payer: Self-pay | Admitting: Family Medicine

## 2019-05-23 DIAGNOSIS — M159 Polyosteoarthritis, unspecified: Secondary | ICD-10-CM

## 2019-05-23 NOTE — Patient Instructions (Signed)
Visit Information  Goals Addressed            This Visit's Progress     Patient Stated   . "I want this joint pain to improve" (pt-stated)       Worsening hip pain in patient with osteoarthritis  Current Barriers:  . Lacks caregiver support.  . Film/video editor.    Nurse Case Manager Clinical Goal(s):  Marland Kitchen Over the next 14 days, patient will have appointment with PCP office regarding worsening hip pain  Interventions:  . Evaluation of current treatment plan related to osteoarthritis and patient's adherence to plan as established by provider. . Discussed HPI . Discussed mediations: OTC Bayer Back and Body . Discussed evaluation by [facility]. Negative Xray of hip. . Recommended scheduling an in person visit with PCP or other provider at [facility] to assess hip pain. May need additional imaging studies. . RN will collaborate with [facility] to have patient scheduled for an appointment.  . RN will follow-up with patient over the next 3 days.   Patient Self Care Activities:  . Performs ADL's independently . Performs IADL's independently  Please see past updates related to this goal by clicking on the "Past Updates" button in the selected goal            The care management team will reach out to the patient again over the next 3 days.   Chong Sicilian, BSN, RN-BC Embedded Chronic Care Manager [facility] / [place] [facility] Direct Dial: [phone]   The patient verbalized understanding of instructions provided today and declined a print copy of patient instruction materials.

## 2019-05-23 NOTE — Telephone Encounter (Signed)
Left message for patient to call back to set up an appt for hip pain.

## 2019-05-23 NOTE — Chronic Care Management (AMB) (Signed)
Chronic Care Management   Follow Up Note   05/23/2019 Name: [name] MRN: [id number] DOB: [birth year]  Referred by: Baruch Gouty, FNP Reason for referral : Chronic Care Management (Patient Outreach)   [name] is a 86 y.o. year old female who is a primary care patient of Baruch Gouty, FNP. The CCM team was consulted for assistance with chronic disease management and care coordination needs.    Review of patient status, including review of consultants reports, relevant laboratory and other test results, and collaboration with appropriate care team members and the patient's provider was performed as part of comprehensive patient evaluation and provision of chronic care management services.    Outpatient Encounter Medications as of 05/23/2019  Medication Sig  . apixaban (ELIQUIS) 5 MG TABS tablet Take 1 tablet (5 mg total) by mouth 2 (two) times daily.  . Cholecalciferol (VITAMIN D3) 5000 units CAPS Take 2,000 Units by mouth daily.   . D-Mannose 500 MG CAPS Take 500 mg by mouth daily.  Marland Kitchen glucose blood (ONE TOUCH ULTRA TEST) test strip USE TO check blood sugars twice daily  . hydrALAZINE (APRESOLINE) 25 MG tablet Take 1 tablet (25 mg total) by mouth 2 (two) times daily.  . hydrALAZINE (APRESOLINE) 50 MG tablet Take 1 tablet (50 mg total) by mouth 2 (two) times daily.  . Insulin Glargine (LANTUS SOLOSTAR) 100 UNIT/ML Solostar Pen INJECT 50 UNITS ONCE DAILY AT 10PM  . Insulin Pen Needle 31G X 8 MM MISC Use once daily with lantus solostar  . Magnesium 250 MG TABS Take 250 mg by mouth daily.  . metFORMIN (GLUCOPHAGE) 500 MG tablet TAKE (1) TABLET DAILY WITH BREAKFAST.  . nabumetone (RELAFEN) 500 MG tablet Take 1 tablet (500 mg total) by mouth 2 (two) times daily. For muscleand joint pain  . nystatin cream (MYCOSTATIN) Apply 1 application topically 2 (two) times daily.  Marland Kitchen omeprazole (PRILOSEC) 20 MG capsule TAKE  (1)  CAPSULE  TWICE DAILY (TAKE ON AN EMPTY STOMACH AT LEAST  30MIN- UTES BEFORE MEALS).  Marland Kitchen ondansetron (ZOFRAN) 4 MG tablet TAKE 1 TABLET EVERY 8 HOURS AS NEEDED FOR NAUSEA & VOMITING  . rOPINIRole (REQUIP) 0.25 MG tablet Take 1 tablet (0.25 mg total) by mouth 3 (three) times daily. Take one tablet in the morning, one tablet at noon and two tablets at bedtime.  . SUPER B COMPLEX/C PO Take 1 tablet by mouth daily.   No facility-administered encounter medications on file as of 05/23/2019.     RN Care Plan   . "I want this joint pain to improve" (pt-stated)       Worsening hip pain in patient with osteoarthritis  Current Barriers:  . Lacks caregiver support.  . Film/video editor.    Nurse Case Manager Clinical Goal(s):  Marland Kitchen Over the next 14 days, patient will have appointment with PCP office regarding worsening hip pain  Interventions:  . Evaluation of current treatment plan related to osteoarthritis and patient's adherence to plan as established by provider. . Discussed HPI . Discussed mediations: OTC Bayer Back and Body . Discussed evaluation by [facility]. Negative Xray of hip. . Recommended scheduling an in person visit with PCP or other provider at [facility] to assess hip pain. May need additional imaging studies. . RN will collaborate with [facility] to have patient scheduled for an appointment.  . RN will follow-up with patient over the next 3 days.   Patient Self Care Activities:  . Performs ADL's independently . Performs  IADL's independently  Please see past updates related to this goal by clicking on the "Past Updates" button in the selected goal           Follow-up Plan The care management team will reach out to the patient again over the next 3 days.    Chong Sicilian, BSN, RN-BC Embedded Chronic Care Manager [facility] / [place] [facility] Direct Dial: [phone]

## 2019-05-23 NOTE — Telephone Encounter (Signed)
-----   Message from Ilean China, RN sent at 05/23/2019 10:38 AM EST ----- Regarding: Appointment for hip pain Ms Balsiger called this morning complaining of worsening hip pain that sometimes keeps her in the bed. She had a neg xray at home by [facility]. I'm not familiar with them but she said her insurance company sent them out.   Her OTC meds are helping and she's in a lot of discomfort. Can you guys give her a call and get her in for an appointment soon? I guess it doesn't matter who she sees.   Thank you! Cyril Mourning

## 2019-05-24 ENCOUNTER — Ambulatory Visit: Payer: PPO | Admitting: *Deleted

## 2019-05-24 DIAGNOSIS — M159 Polyosteoarthritis, unspecified: Secondary | ICD-10-CM

## 2019-05-24 NOTE — Patient Instructions (Signed)
Visit Information  Goals Addressed            This Visit's Progress     Patient Stated   . "I want this joint pain to improve" (pt-stated)       Worsening hip pain in patient with osteoarthritis  Current Barriers:  . Lacks caregiver support.  . Film/video editor.    Nurse Case Manager Clinical Goal(s):  Marland Kitchen Over the next 3 days, patient will keep appointment with PCP to address hip pain.   Interventions:  . Chart reviewed . Patient was contacted by [facility] clinical staff and has been scheduled with her PCP for 05/25/2019 to discuss her worsening hip pain . RN will f/u with patient over the next 7 days   Patient Self Care Activities:  . Performs ADL's independently . Performs IADL's independently  Please see past updates related to this goal by clicking on the "Past Updates" button in the selected goal         The care management team will reach out to the patient again over the next 7 days.    Chong Sicilian, BSN, RN-BC Embedded Chronic Care Manager [facility] / [place] [facility] Direct Dial: [phone]

## 2019-05-24 NOTE — Chronic Care Management (AMB) (Signed)
  Chronic Care Management   Follow Up Note   05/24/2019 Name: [name] MRN: [id number] DOB: 1925/10/14  Referred by: Baruch Gouty, FNP Reason for referral : Chronic Care Management (Care Coordination)   [name] is a 86 y.o. year old female who is a primary care patient of Baruch Gouty, FNP. The CCM team was consulted for assistance with chronic disease management and care coordination needs.     RN Care Plan           This Visit's Progress     . "I want this joint pain to improve"        Worsening hip pain in patient with osteoarthritis  Current Barriers:  . Lacks caregiver support.  . Film/video editor.    Nurse Case Manager Clinical Goal(s):  Marland Kitchen Over the next 3 days, patient will keep appointment with PCP to address hip pain.   Interventions:  . Chart reviewed . Patient was contacted by [facility] clinical staff and has been scheduled with her PCP for 05/25/2019 to discuss her worsening hip pain . RN will f/u with patient over the next 7 days   Patient Self Care Activities:  . Performs ADL's independently . Performs IADL's independently  Please see past updates related to this goal by clicking on the "Past Updates" button in the selected goal           Follow-up Plan The care management team will reach out to the patient again over the next 7 days.    Chong Sicilian, BSN, RN-BC Embedded Chronic Care Manager [facility] / [place] [facility] Direct Dial: [phone]

## 2019-05-25 ENCOUNTER — Ambulatory Visit (INDEPENDENT_AMBULATORY_CARE_PROVIDER_SITE_OTHER): Payer: PPO | Admitting: Family Medicine

## 2019-05-25 ENCOUNTER — Encounter: Payer: Self-pay | Admitting: Family Medicine

## 2019-05-25 DIAGNOSIS — F339 Major depressive disorder, recurrent, unspecified: Secondary | ICD-10-CM | POA: Insufficient documentation

## 2019-05-25 DIAGNOSIS — G8929 Other chronic pain: Secondary | ICD-10-CM | POA: Diagnosis not present

## 2019-05-25 DIAGNOSIS — M25552 Pain in left hip: Secondary | ICD-10-CM

## 2019-05-25 DIAGNOSIS — M25561 Pain in right knee: Secondary | ICD-10-CM

## 2019-05-25 DIAGNOSIS — M797 Fibromyalgia: Secondary | ICD-10-CM | POA: Diagnosis not present

## 2019-05-25 MED ORDER — DULOXETINE HCL 20 MG PO CPEP: 20.0000 mg | ORAL_CAPSULE | Freq: Every day | ORAL | 6 refills | Status: DC

## 2019-05-25 NOTE — Progress Notes (Signed)
Virtual Visit via telephone Note Due to COVID-19 pandemic this visit was conducted virtually. This visit type was conducted due to national recommendations for restrictions regarding the COVID-19 Pandemic (e.g. social distancing, sheltering in place) in an effort to limit this patient's exposure and mitigate transmission in our community. All issues noted in this document were discussed and addressed.  A physical exam was not performed with this format.   I connected with [name] on 05/25/2019 at 1215 by telephone and verified that I am speaking with the correct person using two identifiers. [name] is currently located at home and no one is currently with them during visit. The provider, Monia Pouch, FNP is located in their office at time of visit.  I discussed the limitations, risks, security and privacy concerns of performing an evaluation and management service by telephone and the availability of in person appointments. I also discussed with the patient that there may be a patient responsible charge related to this service. The patient expressed understanding and agreed to proceed.  Subjective:  Patient ID: [name], female    DOB: 05/03/1926, 86 y.o.   MRN: [id number]  Chief Complaint:  Hip Pain   HPI: [name] is a 86 y.o. female presenting on 05/25/2019 for Hip Pain  Pt reports ongoing fibromyalgia pain, left hip pain, and right knee pain. States she has been taking ASA and tylenol without relief of symptoms. States she feels these are like water and do nothing for her pain. States she is very down and depressed due to the ongoing and worsening pain. States she is home alone with nothing to do so some days she does not feel like getting out of bed. Pt states the pain is aching to sharp and worse with movement. No new injuries. No swelling or erythema reported. Still using walker to assist ambulation.  Depression screen [facility] 2/9 05/25/2019 03/04/2019  12/03/2018 11/10/2018 11/03/2018  Decreased Interest 3 0 0 1 0  Down, Depressed, Hopeless 3 0 0 1 0  PHQ - 2 Score 6 0 0 2 0  Altered sleeping 1 - - 0 -  Tired, decreased energy 2 - - 1 -  Change in appetite 1 - - 0 -  Feeling bad or failure about yourself  2 - - 0 -  Trouble concentrating 0 - - 0 -  Moving slowly or fidgety/restless 1 - - 0 -  Suicidal thoughts 0 - - 0 -  PHQ-9 Score 13 - - 3 -  Difficult doing work/chores - - - Somewhat difficult -  Some recent data might be hidden    Relevant past medical, surgical, family, and social history reviewed and updated as indicated.  Allergies and medications reviewed and updated.   Past Medical History:  Diagnosis Date  . Arthritis   . Cancer ([place])    breast  . Cataract   . Diabetes mellitus without complication ([place])   . Frequent UTI   . Hyperlipidemia   . Hypertension   . Neck pain   . Scoliosis   . Stroke [facility])     Past Surgical History:  Procedure Laterality Date  . APPENDECTOMY    . BREAST LUMPECTOMY Left   . CHOLECYSTECTOMY    . JOINT REPLACEMENT     bilat knee   . REPLACEMENT TOTAL KNEE BILATERAL Bilateral   . TONSILLECTOMY      Social History   Socioeconomic History  . Marital status: Widowed    Spouse  name: Not on file  . Number of children: 2  . Years of education: 10  . Highest education level: Some college, no degree  Occupational History  . Occupation: Retired    Fish farm manager: [employer]    Comment: Network engineer  Tobacco Use  . Smoking status: Never Smoker  . Smokeless tobacco: Never Used  Substance and Sexual Activity  . Alcohol use: No    Alcohol/week: 0.0 standard drinks  . Drug use: No  . Sexual activity: Not Currently  Other Topics Concern  . Not on file  Social History Narrative   Lives alone in a one story home.  Husband passed away. She has two sons 2 sons but.  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  Education: specialty courses with [employer], high school education.   Social  Determinants of Health   Financial Resource Strain: Low Risk   . Difficulty of Paying Living Expenses: Not very hard  Food Insecurity: No Food Insecurity  . Worried About Charity fundraiser in the Last Year: Never true  . Ran Out of Food in the Last Year: Never true  Transportation Needs: No Transportation Needs  . Lack of Transportation (Medical): No  . Lack of Transportation (Non-Medical): No  Physical Activity: Inactive  . Days of Exercise per Week: 0 days  . Minutes of Exercise per Session: 0 min  Stress: Stress Concern Present  . Feeling of Stress : To some extent  Social Connections: Moderately Isolated  . Frequency of Communication with Friends and Family: Three times a week  . Frequency of Social Gatherings with Friends and Family: Never  . Attends Religious Services: Never  . Active Member of Clubs or Organizations: No  . Attends Archivist Meetings: Never  . Marital Status: Widowed  Intimate Partner Violence: Not At Risk  . Fear of Current or Ex-Partner: No  . Emotionally Abused: No  . Physically Abused: No  . Sexually Abused: No    Outpatient Encounter Medications as of 05/25/2019  Medication Sig  . apixaban (ELIQUIS) 5 MG TABS tablet Take 1 tablet (5 mg total) by mouth 2 (two) times daily.  . Cholecalciferol (VITAMIN D3) 5000 units CAPS Take 2,000 Units by mouth daily.   . D-Mannose 500 MG CAPS Take 500 mg by mouth daily.  . DULoxetine (CYMBALTA) 20 MG capsule Take 1 capsule (20 mg total) by mouth daily.  Marland Kitchen FLUAD QUADRIVALENT 0.5 ML injection   . glucose blood (ONE TOUCH ULTRA TEST) test strip USE TO check blood sugars twice daily  . hydrALAZINE (APRESOLINE) 25 MG tablet Take 1 tablet (25 mg total) by mouth 2 (two) times daily.  . hydrALAZINE (APRESOLINE) 50 MG tablet Take 1 tablet (50 mg total) by mouth 2 (two) times daily.  . Insulin Glargine (LANTUS SOLOSTAR) 100 UNIT/ML Solostar Pen INJECT 50 UNITS ONCE DAILY AT 10PM  . Insulin Pen Needle 31G X 8 MM  MISC Use once daily with lantus solostar  . Magnesium 250 MG TABS Take 250 mg by mouth daily.  . metFORMIN (GLUCOPHAGE) 500 MG tablet TAKE (1) TABLET DAILY WITH BREAKFAST.  . nabumetone (RELAFEN) 500 MG tablet Take 1 tablet (500 mg total) by mouth 2 (two) times daily. For muscleand joint pain  . nitrofurantoin, macrocrystal-monohydrate, (MACROBID) 100 MG capsule   . nystatin cream (MYCOSTATIN) Apply 1 application topically 2 (two) times daily.  Marland Kitchen omeprazole (PRILOSEC) 20 MG capsule TAKE  (1)  CAPSULE  TWICE DAILY (TAKE ON AN EMPTY STOMACH AT LEAST  [place]).  Marland Kitchen ondansetron (ZOFRAN) 4 MG tablet TAKE 1 TABLET EVERY 8 HOURS AS NEEDED FOR NAUSEA & VOMITING  . rOPINIRole (REQUIP) 0.25 MG tablet Take 1 tablet (0.25 mg total) by mouth 3 (three) times daily. Take one tablet in the morning, one tablet at noon and two tablets at bedtime.  Marland Kitchen SHINGRIX injection   . SUPER B COMPLEX/C PO Take 1 tablet by mouth daily.   No facility-administered encounter medications on file as of 05/25/2019.    Allergies  Allergen Reactions  . Diltiazem Hcl     Red itchy rash started a few hours after taking short acting 120mg  dilt tabs  . Ace Inhibitors Cough    Review of Systems  Constitutional: Positive for activity change, appetite change and fatigue. Negative for chills, diaphoresis, fever and unexpected weight change.  HENT: Negative.   Eyes: Negative.  Negative for photophobia and visual disturbance.  Respiratory: Negative for cough, chest tightness and shortness of breath.   Cardiovascular: Negative for chest pain, palpitations and leg swelling.  Gastrointestinal: Negative for abdominal pain, blood in stool, constipation, diarrhea, nausea and vomiting.  Endocrine: Negative.   Genitourinary: Negative for decreased urine volume, difficulty urinating, dysuria, frequency and urgency.  Musculoskeletal: Positive for arthralgias, back pain, gait problem and myalgias. Negative for joint swelling, neck  pain and neck stiffness.  Skin: Negative.  Negative for color change.  Allergic/Immunologic: Negative.   Neurological: Positive for weakness (generalized). Negative for dizziness, tremors, seizures, syncope, facial asymmetry, speech difficulty, light-headedness, numbness and headaches.  Hematological: Negative.   Psychiatric/Behavioral: Positive for decreased concentration, dysphoric mood and sleep disturbance. Negative for agitation, behavioral problems, confusion, hallucinations, self-injury and suicidal ideas. The patient is not nervous/anxious and is not hyperactive.   All other systems reviewed and are negative.        Observations/Objective: No vital signs or physical exam, this was a telephone or virtual health encounter.  Pt alert and oriented, answers all questions appropriately, and able to speak in full sentences.    Assessment and Plan: Leisa was seen today for hip pain.  Diagnoses and all orders for this visit:  Chronic pain of right knee Chronic left hip pain Fibromyalgia Ongoing and worsening pain despite ASA and tylenol. No new injuries. Will initiate below to see if beneficial. Follow up in 4 weeks for reevaluation.  -     DULoxetine (CYMBALTA) 20 MG capsule; Take 1 capsule (20 mg total) by mouth daily.  Depression, recurrent ([place]) Ongoing and worsening symptoms since onset of COVID-19 pandemic. States the pain is making her depression worse. Will trial below for dual benefit. Pt aware to report any new, worsening, or persistent symptoms.  -     DULoxetine (CYMBALTA) 20 MG capsule; Take 1 capsule (20 mg total) by mouth daily.     Follow Up Instructions: Return in about 1 week (around 06/01/2019), or if symptoms worsen or fail to improve, for GAD.    I discussed the assessment and treatment plan with the patient. The patient was provided an opportunity to ask questions and all were answered. The patient agreed with the plan and demonstrated an understanding of the  instructions.   The patient was advised to call back or seek an in-person evaluation if the symptoms worsen or if the condition fails to improve as anticipated.  The above assessment and management plan was discussed with the patient. The patient verbalized understanding of and has agreed to the management plan. Patient is aware to  call the clinic if they develop any new symptoms or if symptoms persist or worsen. Patient is aware when to return to the clinic for a follow-up visit. Patient educated on when it is appropriate to go to the emergency department.    I provided 25 minutes of non-face-to-face time during this encounter. The call started at 1215. The call ended at 1240. The other time was used for coordination of care.    Monia Pouch, FNP-C [facility] [address] [place], [place] [address] [phone] 05/25/2019

## 2019-05-26 ENCOUNTER — Telehealth: Payer: Self-pay | Admitting: Family Medicine

## 2019-05-26 NOTE — Telephone Encounter (Signed)
Please review and advise on medication request.

## 2019-05-26 NOTE — Telephone Encounter (Signed)
Left message to please call our office. 

## 2019-05-26 NOTE — Telephone Encounter (Signed)
Cymbalta was sent in to pharmacy yesterday, it is noted in the visit. This is dual beneficial for pain and depression.

## 2019-05-27 ENCOUNTER — Ambulatory Visit: Payer: PPO | Admitting: *Deleted

## 2019-05-27 DIAGNOSIS — G8929 Other chronic pain: Secondary | ICD-10-CM

## 2019-05-27 DIAGNOSIS — M797 Fibromyalgia: Secondary | ICD-10-CM

## 2019-05-27 DIAGNOSIS — F339 Major depressive disorder, recurrent, unspecified: Secondary | ICD-10-CM

## 2019-05-27 DIAGNOSIS — M25552 Pain in left hip: Secondary | ICD-10-CM

## 2019-05-27 DIAGNOSIS — M159 Polyosteoarthritis, unspecified: Secondary | ICD-10-CM

## 2019-05-27 NOTE — Patient Instructions (Signed)
Visit Information  Goals Addressed            This Visit's Progress     Patient Stated   . "I want this joint pain to improve" (pt-stated)       Worsening hip pain in patient with osteoarthritis  Current Barriers:  . Lacks caregiver support.  . Film/video editor.    Nurse Case Manager Clinical Goal(s):  Marland Kitchen Over the next 30 days, will take medications as prescribed . Over the next 30 day, patient will see improvement in joint pain   Interventions:  . Chart reviewed, including recent office and telephone notes o Cymbalta ordered to help manage depression and chronic pain . Reached out to patient but was unable to speak with her by phone. Voicemail message left.   Patient Self Care Activities:  . Performs ADL's independently . Performs IADL's independently  Please see past updates related to this goal by clicking on the "Past Updates" button in the selected goal           A HIPPA compliant phone message was left for the patient providing contact information and requesting a return call.  The care management team will reach out to the patient again over the next 7 days.   Chong Sicilian, BSN, RN-BC Embedded Chronic Care Manager [facility] / [place] Management Direct Dial: [phone]

## 2019-05-27 NOTE — Chronic Care Management (AMB) (Signed)
  Chronic Care Management   Follow Up Note   05/27/2019 Name: MERIDA SLATES MRN: [id number] DOB: 1925/10/17  Referred by: Baruch Gouty, FNP Reason for referral : Chronic Care Management (RN follow up)   AYLANIE GARINO is a 86 y.o. year old female who is a primary care patient of Baruch Gouty, FNP. The CCM team was consulted for assistance with chronic disease management and care coordination needs.     RN Care Plan   . "I want this joint pain to improve" (pt-stated)       Worsening hip pain in patient with osteoarthritis  Current Barriers:  . Lacks caregiver support.  . Film/video editor.    Nurse Case Manager Clinical Goal(s):  Marland Kitchen Over the next 30 days, will take medications as prescribed . Over the next 30 day, patient will see improvement in joint pain   Interventions:  . Chart reviewed, including recent office and telephone notes o Cymbalta ordered to help manage depression and chronic pain . Reached out to patient but was unable to speak with her by phone. Voicemail message left.   Patient Self Care Activities:  . Performs ADL's independently . Performs IADL's independently  Please see past updates related to this goal by clicking on the "Past Updates" button in the selected goal            Plan:   The care management team will reach out to the patient again over the next 7 days.    Chong Sicilian, BSN, RN-BC Embedded Chronic Care Manager [facility] / [place] Management Direct Dial: [phone]

## 2019-05-30 NOTE — Telephone Encounter (Signed)
Left message , Cymbalta at pharmacy.

## 2019-05-31 ENCOUNTER — Ambulatory Visit: Payer: PPO | Admitting: *Deleted

## 2019-05-31 DIAGNOSIS — M159 Polyosteoarthritis, unspecified: Secondary | ICD-10-CM

## 2019-05-31 NOTE — Patient Instructions (Signed)
Visit Information  Goals Addressed            This Visit's Progress     Patient Stated   . "I want this joint pain to improve" (pt-stated)       Worsening hip pain in patient with osteoarthritis  Current Barriers:  . Lacks caregiver support.  . Film/video editor.    Nurse Case Manager Clinical Goal(s):  Marland Kitchen Over the next 15 days, patient will work with primary care provider to address hip pain  Interventions:  . Chart reviewed, including recent office and telephone notes . Discussed medications with patient including newly prescribed Cymbalta o She took one dose and it made her restless and "scared" and she wasn't able to sleep. She hasn't taken anymore o She questioned if Valium would help. Discussed uses for Valium and potential problems in the elderly population . Patient requesting further imaging studies of hip.  . I will consult with PCP to see if she thinks it is appropriate . I will f/u with patient once I have a response . Patient encouraged to reach out to RN CCM as needed . Encouraged continued use of walker for ambulation  Patient Self Care Activities:  . Performs ADL's independently . Performs IADL's independently  Please see past updates related to this goal by clicking on the "Past Updates" button in the selected goal          The care management team will reach out to the patient again over the next 7 days.    Chong Sicilian, BSN, RN-BC Embedded Chronic Care Manager [facility] / [place] Management Direct Dial: [phone]   The patient verbalized understanding of instructions provided today and declined a print copy of patient instruction materials.

## 2019-05-31 NOTE — Chronic Care Management (AMB) (Signed)
Chronic Care Management   Follow Up Note   05/31/2019 Name: [name] MRN: [id number] DOB: 1926-02-13  Referred by: Baruch Gouty, FNP Reason for referral : Chronic Care Management (RN follow up)   [name] is a 86 y.o. year old female who is a primary care patient of Baruch Gouty, FNP. The CCM team was consulted for assistance with chronic disease management and care coordination needs.    Review of patient status, including review of consultants reports, relevant laboratory and other test results, and collaboration with appropriate care team members and the patient's provider was performed as part of comprehensive patient evaluation and provision of chronic care management services.     Outpatient Encounter Medications as of 05/31/2019  Medication Sig  . apixaban (ELIQUIS) 5 MG TABS tablet Take 1 tablet (5 mg total) by mouth 2 (two) times daily.  . Cholecalciferol (VITAMIN D3) 5000 units CAPS Take 2,000 Units by mouth daily.   . D-Mannose 500 MG CAPS Take 500 mg by mouth daily.  . DULoxetine (CYMBALTA) 20 MG capsule Take 1 capsule (20 mg total) by mouth daily.  Marland Kitchen FLUAD QUADRIVALENT 0.5 ML injection   . glucose blood (ONE TOUCH ULTRA TEST) test strip USE TO check blood sugars twice daily  . hydrALAZINE (APRESOLINE) 25 MG tablet Take 1 tablet (25 mg total) by mouth 2 (two) times daily.  . hydrALAZINE (APRESOLINE) 50 MG tablet Take 1 tablet (50 mg total) by mouth 2 (two) times daily.  . Insulin Glargine (LANTUS SOLOSTAR) 100 UNIT/ML Solostar Pen INJECT 50 UNITS ONCE DAILY AT 10PM  . Insulin Pen Needle 31G X 8 MM MISC Use once daily with lantus solostar  . Magnesium 250 MG TABS Take 250 mg by mouth daily.  . metFORMIN (GLUCOPHAGE) 500 MG tablet TAKE (1) TABLET DAILY WITH BREAKFAST.  . nabumetone (RELAFEN) 500 MG tablet Take 1 tablet (500 mg total) by mouth 2 (two) times daily. For muscleand joint pain  . nitrofurantoin, macrocrystal-monohydrate, (MACROBID) 100 MG capsule    . nystatin cream (MYCOSTATIN) Apply 1 application topically 2 (two) times daily.  Marland Kitchen omeprazole (PRILOSEC) 20 MG capsule TAKE  (1)  CAPSULE  TWICE DAILY (TAKE ON AN EMPTY STOMACH AT LEAST 30 MIN- UTES BEFORE MEALS).  Marland Kitchen ondansetron (ZOFRAN) 4 MG tablet TAKE 1 TABLET EVERY 8 HOURS AS NEEDED FOR NAUSEA & VOMITING  . rOPINIRole (REQUIP) 0.25 MG tablet Take 1 tablet (0.25 mg total) by mouth 3 (three) times daily. Take one tablet in the morning, one tablet at noon and two tablets at bedtime.  Marland Kitchen SHINGRIX injection   . SUPER B COMPLEX/C PO Take 1 tablet by mouth daily.   No facility-administered encounter medications on file as of 05/31/2019.     RN Care Plan           This Visit's Progress     . "I want this joint pain to improve" (pt-stated)       Worsening hip pain in patient with osteoarthritis  Current Barriers:  . Lacks caregiver support.  . Film/video editor.    Nurse Case Manager Clinical Goal(s):  Marland Kitchen Over the next 15 days, patient will work with primary care provider to address hip pain  Interventions:  . Chart reviewed, including recent office and telephone notes . Discussed medications with patient including newly prescribed Cymbalta o She took one dose and it made her restless and "scared" and she wasn't able to sleep. She hasn't taken anymore o She questioned if  Valium would help. Discussed uses for Valium and potential problems in the elderly population . Patient requesting further imaging studies of hip.  . I will consult with PCP to see if she thinks it is appropriate . I will f/u with patient once I have a response . Patient encouraged to reach out to RN CCM as needed . Encouraged continued use of walker for ambulation  Patient Self Care Activities:  . Performs ADL's independently . Performs IADL's independently  Please see past updates related to this goal by clicking on the "Past Updates" button in the selected goal            Plan:   The care  management team will reach out to the patient again over the next 7 days.    Chong Sicilian, BSN, RN-BC Embedded Chronic Care Manager [facility] / [place] Management Direct Dial: [phone]

## 2019-06-02 ENCOUNTER — Ambulatory Visit: Payer: PPO | Admitting: *Deleted

## 2019-06-02 DIAGNOSIS — E785 Hyperlipidemia, unspecified: Secondary | ICD-10-CM | POA: Diagnosis not present

## 2019-06-02 DIAGNOSIS — E1169 Type 2 diabetes mellitus with other specified complication: Secondary | ICD-10-CM | POA: Diagnosis not present

## 2019-06-02 DIAGNOSIS — F339 Major depressive disorder, recurrent, unspecified: Secondary | ICD-10-CM

## 2019-06-02 DIAGNOSIS — M8949 Other hypertrophic osteoarthropathy, multiple sites: Secondary | ICD-10-CM

## 2019-06-02 DIAGNOSIS — E1140 Type 2 diabetes mellitus with diabetic neuropathy, unspecified: Secondary | ICD-10-CM

## 2019-06-02 DIAGNOSIS — M159 Polyosteoarthritis, unspecified: Secondary | ICD-10-CM

## 2019-06-02 DIAGNOSIS — Z794 Long term (current) use of insulin: Secondary | ICD-10-CM

## 2019-06-02 DIAGNOSIS — E1159 Type 2 diabetes mellitus with other circulatory complications: Secondary | ICD-10-CM

## 2019-06-02 DIAGNOSIS — I10 Essential (primary) hypertension: Secondary | ICD-10-CM

## 2019-06-02 NOTE — Patient Instructions (Signed)
Visit Information  Goals Addressed            This Visit's Progress     Patient Stated   . "I want this joint pain to improve" (pt-stated)       Worsening hip pain in patient with osteoarthritis  Current Barriers:  . Lacks caregiver support.  . Film/video editor.    Nurse Case Manager Clinical Goal(s):  Marland Kitchen Over the next 15 days, patient will work with primary care provider to address hip pain  Interventions:  . Chart reviewed, including recent office and telephone notes . Collaborated with PCP, Darla Lesches, FNP  . Talked with patient by telephone . Collaborated with [place] [phone]), who did a home visit last week, and requested that they fax the xray report over to [facility] attn: Roselyn Reef or [name] . Advised Georgina Pillion, LPN and Monia Pouch, FNP that the report is coming over . RN will collaborate with PCP once she has reviewed the report  Patient Self Care Activities:  . Performs ADL's independently . Performs IADL's independently  Please see past updates related to this goal by clicking on the "Past Updates" button in the selected goal          The care management team will reach out to the patient again over the next 7 days.    Chong Sicilian, BSN, RN-BC Embedded Chronic Care Manager [facility] / [place] Management Direct Dial: [phone]

## 2019-06-02 NOTE — Chronic Care Management (AMB) (Signed)
  Chronic Care Management   Follow Up Note   06/02/2019 Name: [name] MRN: [id number] DOB: 04-07-1926  Referred by: Baruch Gouty, FNP Reason for referral : Chronic Care Management (RN follow up)   [name] is a 86 y.o. year old female who is a primary care patient of Baruch Gouty, FNP. The CCM team was consulted for assistance with chronic disease management and care coordination needs.    Review of patient status, including review of consultants reports, relevant laboratory and other test results, and collaboration with appropriate care team members and the patient's provider was performed as part of comprehensive patient evaluation and provision of chronic care management services.     RN Care Plan   . "I want this joint pain to improve" (pt-stated)       Worsening hip pain in patient with osteoarthritis  Current Barriers:  . Lacks caregiver support.  . Film/video editor.    Nurse Case Manager Clinical Goal(s):  Marland Kitchen Over the next 15 days, patient will work with primary care provider to address hip pain  Interventions:  . Chart reviewed, including recent office and telephone notes . Collaborated with PCP, Darla Lesches, FNP  . Talked with patient by telephone . Collaborated with [place] [phone]), who did a home visit last week, and requested that they fax the xray report over to [facility] attn: Roselyn Reef or [name] . Advised Georgina Pillion, LPN and Monia Pouch, FNP that the report is coming over . RN will collaborate with PCP once she has reviewed the report  Patient Self Care Activities:  . Performs ADL's independently . Performs IADL's independently  Please see past updates related to this goal by clicking on the "Past Updates" button in the selected goal            Plan:   The care management team will reach out to the patient again over the next 7 days.    Chong Sicilian, BSN, RN-BC Embedded Chronic Care Manager [facility] [place] Management Direct Dial: [phone]

## 2019-06-03 ENCOUNTER — Telehealth: Payer: PPO

## 2019-06-06 ENCOUNTER — Ambulatory Visit: Payer: PPO | Admitting: *Deleted

## 2019-06-06 ENCOUNTER — Ambulatory Visit: Payer: PPO | Admitting: Family Medicine

## 2019-06-06 DIAGNOSIS — M159 Polyosteoarthritis, unspecified: Secondary | ICD-10-CM

## 2019-06-07 NOTE — Patient Instructions (Signed)
Visit Information  Goals Addressed            This Visit's Progress     Patient Stated   . "I want this joint pain to improve" (pt-stated)       Worsening hip pain in patient with osteoarthritis  Current Barriers:  . Lacks caregiver support.  . Film/video editor.    Nurse Case Manager Clinical Goal(s):  Marland Kitchen Over the next 15 days, patient will work with primary care provider to address hip pain  Interventions:  . Consulted with Monia Pouch, FNP regarding hip xray report. She has not seen it but will check with front office staff to see if the fax was received.  . RN will reach back out to [facility] if the fax was not received  Patient Self Care Activities:  . Performs ADL's independently . Performs IADL's independently  Please see past updates related to this goal by clicking on the "Past Updates" button in the selected goal          Chong Sicilian, BSN, RN-BC [place] / [place]: [phone]

## 2019-06-07 NOTE — Chronic Care Management (AMB) (Signed)
  Chronic Care Management   Follow Up Note   06/06/2019 Name: [name] MRN: [id number] DOB: [birth year]  Referred by: Baruch Gouty, FNP Reason for referral : Chronic Care Management (care coordination)   [name] is a 86 y.o. year old female who is a primary care patient of Baruch Gouty, FNP. The CCM team was consulted for assistance with chronic disease management and care coordination needs.    Review of patient status, including review of consultants reports, relevant laboratory and other test results, and collaboration with appropriate care team members and the patient's provider was performed as part of comprehensive patient evaluation and provision of chronic care management services.     RN Care Plan   . "I want this joint pain to improve" (pt-stated)       Worsening hip pain in patient with osteoarthritis  Current Barriers:  . Lacks caregiver support.  . Film/video editor.    Nurse Case Manager Clinical Goal(s):  Marland Kitchen Over the next 15 days, patient will work with primary care provider to address hip pain  Interventions:  . Consulted with Monia Pouch, FNP regarding hip xray report. She has not seen it but will check with front office staff to see if the fax was received.  . RN will reach back out to [place] if the fax was not received  Patient Self Care Activities:  . Performs ADL's independently . Performs IADL's independently  Please see past updates related to this goal by clicking on the "Past Updates" button in the selected goal            Plan:  RN will f/u with PCP regarding hip xray over the next 2 days   Chong Sicilian, BSN, RN-BC [place] [facility] [place] [facility] Direct Dial: [phone]

## 2019-06-10 ENCOUNTER — Ambulatory Visit (INDEPENDENT_AMBULATORY_CARE_PROVIDER_SITE_OTHER): Payer: PPO | Admitting: Family Medicine

## 2019-06-10 DIAGNOSIS — M79676 Pain in unspecified toe(s): Secondary | ICD-10-CM | POA: Diagnosis not present

## 2019-06-10 DIAGNOSIS — Z91199 Patient's noncompliance with other medical treatment and regimen due to unspecified reason: Secondary | ICD-10-CM

## 2019-06-10 DIAGNOSIS — B351 Tinea unguium: Secondary | ICD-10-CM | POA: Diagnosis not present

## 2019-06-10 DIAGNOSIS — Z5329 Procedure and treatment not carried out because of patient's decision for other reasons: Secondary | ICD-10-CM

## 2019-06-10 DIAGNOSIS — E1142 Type 2 diabetes mellitus with diabetic polyneuropathy: Secondary | ICD-10-CM | POA: Diagnosis not present

## 2019-06-10 DIAGNOSIS — L84 Corns and callosities: Secondary | ICD-10-CM | POA: Diagnosis not present

## 2019-06-10 NOTE — Progress Notes (Signed)
Attempted to call at 1510, no answer. Message left.  Attempted to call at 1530, no answer.  Attempted to call at 1535, no answer.  Attempted to call at 1540, no answer. No returned calls received.  Attempted to call at 1550, no answer. No show for appointment.

## 2019-06-15 ENCOUNTER — Ambulatory Visit (INDEPENDENT_AMBULATORY_CARE_PROVIDER_SITE_OTHER): Payer: PPO | Admitting: Licensed Clinical Social Worker

## 2019-06-15 DIAGNOSIS — M159 Polyosteoarthritis, unspecified: Secondary | ICD-10-CM

## 2019-06-15 DIAGNOSIS — M8949 Other hypertrophic osteoarthropathy, multiple sites: Secondary | ICD-10-CM

## 2019-06-15 DIAGNOSIS — E1140 Type 2 diabetes mellitus with diabetic neuropathy, unspecified: Secondary | ICD-10-CM

## 2019-06-15 DIAGNOSIS — K219 Gastro-esophageal reflux disease without esophagitis: Secondary | ICD-10-CM

## 2019-06-15 DIAGNOSIS — E785 Hyperlipidemia, unspecified: Secondary | ICD-10-CM | POA: Diagnosis not present

## 2019-06-15 DIAGNOSIS — M797 Fibromyalgia: Secondary | ICD-10-CM

## 2019-06-15 DIAGNOSIS — E1159 Type 2 diabetes mellitus with other circulatory complications: Secondary | ICD-10-CM

## 2019-06-15 DIAGNOSIS — I10 Essential (primary) hypertension: Secondary | ICD-10-CM | POA: Diagnosis not present

## 2019-06-15 DIAGNOSIS — E1169 Type 2 diabetes mellitus with other specified complication: Secondary | ICD-10-CM | POA: Diagnosis not present

## 2019-06-15 DIAGNOSIS — Z794 Long term (current) use of insulin: Secondary | ICD-10-CM

## 2019-06-15 NOTE — Patient Instructions (Addendum)
Licensed Clinical Social Worker Visit Information  Goals we discussed today:  Goals        . "I wish I could get out of the house and go to the grocery store" (pt-stated)     Current Barriers:   Lacks caregiver support.    Social isolation in patient with Chronic Diagnoses of Fibromyalgia, Type 2 DM, Hyperlipidemia, Osteoarthritis, GERD, and HTN   Nurse Case Manager Clinical Goal(s):  Marland Kitchen Over the next 30 days, patient will work with CM clinical social worker to address psychosocial issues related to social isolation.   Interventions:  . Advised patient to talk with friends/family on the phone often for social/emotional support.  . Suggested that patient make a quick trip to the grocery store once or twice a week.  o She has an N95 mask. I advised her to wear it. o Only go in the store if it is not crowded o Limit distance from other people to 6 feet or more o Wash hands often and thoroughly o Talked with client about calling in grocery order to local [employer] store and then just driving to store to pay for ordered food and to pick it up  Previously collaborated with [facility] related to client needs.   Patient encouraged to reach out with any medical or psychosocial needs  Talked with Fraser Din about ambulation challenges of client  Talked with client about pain issues of client  Talked with client about sleeping challenges of client   Talked with client about food procurement for client  Client talked with [name] about [place] support (she said representative from [employer] visited her in the home today)  Patient Self Care Activities:  . Self administers medications as prescribed . Attends all scheduled provider appointments . Calls pharmacy for medication refills . Attends church or other social activities . Performs ADL's independently . Performs IADL's independently . Calls provider office for new concerns or questions  Initial goal documentation            Materials Provided: No    Follow Up Plan:[name] to call client in next 4 weeks to assess the psychosocial needs of client at that time   The patient verbalized understanding of instructions provided today and declined a print copy of patient instruction materials.   Norva Riffle.Kyla Duffy MSW, [name] Licensed Clinical Social Worker [facility] [phone]

## 2019-06-15 NOTE — Chronic Care Management (AMB) (Signed)
Care Management Note   [name] is a 86 y.o. year old female who is a primary care patient of Baruch Gouty, FNP. The CM team was consulted for assistance with chronic disease management and care coordination.   I reached out to [name] by phone today.   Review of patient status, including review of consultants reports, relevant laboratory and other test results, and collaboration with appropriate care team members and the patient's provider was performed as part of comprehensive patient evaluation and provision of chronic care management services.   Social determinants of health: risk of social isolation; risk of depression; risk of stress; risk of physical inactivity    Office Visit from 05/25/2019 in [facility]  PHQ-9 Total Score  13     Medications   apixaban (ELIQUIS) 5 MG TABS tablet Cholecalciferol (VITAMIN D3) 5000 units CAPS D-[place] 500 MG CAPS DULoxetine (CYMBALTA) 20 MG capsule glucose blood (ONE TOUCH ULTRA TEST) test strip hydrALAZINE (APRESOLINE) 25 MG tablet(Expired) hydrALAZINE (APRESOLINE) 50 MG tablet(Expired) Insulin Glargine (LANTUS SOLOSTAR) 100 UNIT/ML Solostar Pen Insulin Pen Needle 31G X 8 MM MISC Magnesium 250 MG TABS metFORMIN (GLUCOPHAGE) 500 MG tablet nabumetone (RELAFEN) 500 MG tablet nitrofurantoin, macrocrystal-monohydrate, (MACROBID) 100 MG capsule nystatin cream (MYCOSTATIN) omeprazole (PRILOSEC) 20 MG capsule ondansetron (ZOFRAN) 4 MG tablet rOPINIRole (REQUIP) 0.25 MG tablet(Expired) SUPER B COMPLEX/C PO  Goals            . "I wish I could get out of the house and go to the grocery store" (pt-stated)     Current Barriers:   Lacks caregiver support.    Social isolation in patient with Chronic Diagnoses of Fibromyalgia, Type 2 DM, Hyperlipidemia, Osteoarthritis, GERD, and HTN   Nurse Case Manager Clinical Goal(s):  Marland Kitchen Over the next 30 days, patient will work with CM clinical social worker to  address psychosocial issues related to social isolation.   Interventions:  . Advised patient to talk with friends/family on the phone often for social/emotional support.  . Suggested that patient make a quick trip to the grocery store once or twice a week.  o She has an N95 mask. I advised her to wear it. o Only go in the store if it is not crowded o Limit distance from other people to 6 feet or more o Wash hands often and thoroughly o Talked with client about calling in grocery order to local [employer] store and then just driving to store to pay for ordered food and to pick it up  Previously collaborated with [facility] related to client needs.   Patient encouraged to reach out with any medical or psychosocial needs  Talked with [name] about ambulation challenges of client  Talked with client about pain issues of client  Talked with client about sleeping challenges of client   Talked with client about food procurement for client  Client talked with [name] about [place] support (she said representative from [employer] visited her in the home today)  Patient Self Care Activities:  . Self administers medications as prescribed . Attends all scheduled provider appointments . Calls pharmacy for medication refills . Attends church or other social activities . Performs ADL's independently . Performs IADL's independently . Calls provider office for new concerns or questions  Initial goal documentation         Follow Up Plan: [name] to call client in next 4 weeks to assess the psychosocial needs of client at that time  [name]  MSW, [name] Licensed Clinical Social Worker [facility] Care [facility] [phone]

## 2019-06-16 ENCOUNTER — Ambulatory Visit (INDEPENDENT_AMBULATORY_CARE_PROVIDER_SITE_OTHER): Payer: PPO | Admitting: Family Medicine

## 2019-06-16 ENCOUNTER — Other Ambulatory Visit: Payer: Self-pay

## 2019-06-16 DIAGNOSIS — Z91199 Patient's noncompliance with other medical treatment and regimen due to unspecified reason: Secondary | ICD-10-CM

## 2019-06-16 DIAGNOSIS — Z5329 Procedure and treatment not carried out because of patient's decision for other reasons: Secondary | ICD-10-CM

## 2019-06-16 NOTE — Progress Notes (Signed)
No 1115:Called pt for visit, no answer. Message left. Will try again.   1130: Called pt for visit. No answer. Message left. Will try again.   1140: Called pt with no answer.   1150: Called pt with no answer.   1155: Called with no answer. Message left for pt to call the office within the next 15 minutes or the visit would need to be rescheduled.   1215: Attempted to call pt again, no answer.

## 2019-06-18 ENCOUNTER — Other Ambulatory Visit: Payer: Self-pay | Admitting: Family Medicine

## 2019-06-20 ENCOUNTER — Other Ambulatory Visit: Payer: Self-pay | Admitting: Family Medicine

## 2019-06-20 DIAGNOSIS — R110 Nausea: Secondary | ICD-10-CM

## 2019-06-20 NOTE — Telephone Encounter (Signed)
Potassium is no longer on patient's med list.  Patient has follow up appointment on 06/22/19.

## 2019-06-20 NOTE — Telephone Encounter (Signed)
Last office visit 02/05/19, has appointment with you on 06/22/19.

## 2019-06-21 ENCOUNTER — Other Ambulatory Visit: Payer: Self-pay

## 2019-06-22 ENCOUNTER — Telehealth: Payer: Self-pay | Admitting: Family Medicine

## 2019-06-22 ENCOUNTER — Encounter: Payer: Self-pay | Admitting: Family Medicine

## 2019-06-22 ENCOUNTER — Ambulatory Visit (INDEPENDENT_AMBULATORY_CARE_PROVIDER_SITE_OTHER): Payer: PPO | Admitting: Family Medicine

## 2019-06-22 VITALS — BP 181/100 | HR 74 | Temp 98.2°F | Ht <= 58 in | Wt 150.0 lb

## 2019-06-22 DIAGNOSIS — M25551 Pain in right hip: Secondary | ICD-10-CM | POA: Diagnosis not present

## 2019-06-22 DIAGNOSIS — E1169 Type 2 diabetes mellitus with other specified complication: Secondary | ICD-10-CM

## 2019-06-22 DIAGNOSIS — R252 Cramp and spasm: Secondary | ICD-10-CM | POA: Diagnosis not present

## 2019-06-22 DIAGNOSIS — G8929 Other chronic pain: Secondary | ICD-10-CM

## 2019-06-22 DIAGNOSIS — F339 Major depressive disorder, recurrent, unspecified: Secondary | ICD-10-CM | POA: Diagnosis not present

## 2019-06-22 DIAGNOSIS — R5381 Other malaise: Secondary | ICD-10-CM

## 2019-06-22 DIAGNOSIS — E1142 Type 2 diabetes mellitus with diabetic polyneuropathy: Secondary | ICD-10-CM | POA: Diagnosis not present

## 2019-06-22 DIAGNOSIS — I4821 Permanent atrial fibrillation: Secondary | ICD-10-CM

## 2019-06-22 DIAGNOSIS — M25552 Pain in left hip: Secondary | ICD-10-CM | POA: Diagnosis not present

## 2019-06-22 DIAGNOSIS — I5032 Chronic diastolic (congestive) heart failure: Secondary | ICD-10-CM

## 2019-06-22 DIAGNOSIS — I10 Essential (primary) hypertension: Secondary | ICD-10-CM | POA: Diagnosis not present

## 2019-06-22 DIAGNOSIS — M25561 Pain in right knee: Secondary | ICD-10-CM

## 2019-06-22 DIAGNOSIS — Z794 Long term (current) use of insulin: Secondary | ICD-10-CM | POA: Diagnosis not present

## 2019-06-22 DIAGNOSIS — E1140 Type 2 diabetes mellitus with diabetic neuropathy, unspecified: Secondary | ICD-10-CM

## 2019-06-22 DIAGNOSIS — R5383 Other fatigue: Secondary | ICD-10-CM

## 2019-06-22 DIAGNOSIS — E785 Hyperlipidemia, unspecified: Secondary | ICD-10-CM | POA: Diagnosis not present

## 2019-06-22 DIAGNOSIS — E1159 Type 2 diabetes mellitus with other circulatory complications: Secondary | ICD-10-CM

## 2019-06-22 DIAGNOSIS — I152 Hypertension secondary to endocrine disorders: Secondary | ICD-10-CM

## 2019-06-22 LAB — BAYER DCA HB A1C WAIVED: HB A1C (BAYER DCA - WAIVED): 6.1 % (ref <7.0)

## 2019-06-22 MED ORDER — KETOROLAC TROMETHAMINE 30 MG/ML IJ SOLN
30.0000 mg | Freq: Once | INTRAMUSCULAR | Status: AC
  Administered 2019-06-22: 13:00:00 30 mg via INTRAMUSCULAR

## 2019-06-22 MED ORDER — METHYLPREDNISOLONE ACETATE 40 MG/ML IJ SUSP
40.0000 mg | Freq: Once | INTRAMUSCULAR | Status: AC
  Administered 2019-06-22: 13:00:00 40 mg via INTRAMUSCULAR

## 2019-06-22 NOTE — Patient Instructions (Signed)

## 2019-06-22 NOTE — Progress Notes (Signed)
Subjective:  Patient ID: [name], female    DOB: [birth year], 86 y.o.   MRN: [id number]  Patient Care Team: Baruch Gouty, FNP as PCP - General (Family Medicine) Steffanie Rainwater, DPM as Consulting Physician (Podiatry) Marin Comment, Allison Quarry, MD as Consulting Physician (Optometry) Ilean China, RN as Case Manager [place], Norva Riffle, [name] as Social Worker (Licensed Clinical Social Worker)   Chief Complaint:  Pain in both hips and Medical Management of Chronic Issues   HPI: PARALEE [name] is a 86 y.o. female presenting on 06/22/2019 for Pain in both hips and Medical Management of Chronic Issues   1. Chronic left hip pain 2. Right hip pain 3. Chronic pain of right knee Pt presents today for ongoing bilateral hip pain and right knee pain that is causing difficulty walking. She does use a walker but still finds it hard to get around due to the ongoing and worsening pain. She had mobile imaging completed that revealed degenerative changes without acute fractures. Pt states Tylenol is like taking water and she does not take it as it does not work. She has not been using Voltaren because her son had a bad reaction to this in the past. She has tried Runner, broadcasting/film/video with minimal relief. She has not been to orthopedics in a while and is willing to be reevaluated.   4. Chronic diastolic heart failure ([place]) Pt denies weight changes, increased shortness of breath or leg swelling. No orthopnea or PND.   5. Depression, recurrent ([place]) Pt feels her depression has improved over the last few weeks. She is compliant with Cymbalta and is tolerating well.   Depression screen [facility] 2/9 06/22/2019 05/25/2019 03/04/2019 12/03/2018 11/10/2018  Decreased Interest 0 3 0 0 1  Down, Depressed, Hopeless 0 3 0 0 1  PHQ - 2 Score 0 6 0 0 2  Altered sleeping - 1 - - 0  Tired, decreased energy - 2 - - 1  Change in appetite - 1 - - 0  Feeling bad or failure about yourself  - 2 - - 0  Trouble concentrating - 0 - - 0    Moving slowly or fidgety/restless - 1 - - 0  Suicidal thoughts - 0 - - 0  PHQ-9 Score - 13 - - 3  Difficult doing work/chores - - - - Somewhat difficult  Some recent data might be hidden     6. Diabetic polyneuropathy associated with type 2 diabetes mellitus ([place]) 7. Type 2 diabetes mellitus with diabetic neuropathy, with long-term current use of insulin (HCC) Compliant with medications. Does try to watch diet. She does not exercise on a regular basis due to chronic hip and knee pain. She is due for her eye exam and states she will schedule an appointment. No reported hypo- or hyperglycemic episodes. No polyuria, polydipsia, or polyphagia. She does continue to have neuropathy that seems to be worse at night. She takes her medications as prescribed for this.   8. Permanent atrial fibrillation [facility]) Does see cardiology at least once per year. Is taking Eliquis as prescribed and apresoline. No palpitations, shortness of breath, or leg swelling. No confusion.   9. Hyperlipidemia associated with type 2 diabetes mellitus ([place]) Has been diet controlled due to intolerance to statin therapy. She does try to watch diet but is not active on a regular basis due to chronic pain.   10. Other fatigue Pt reports ongoing and worsening fatigue. States she feels tired on a daily  basis. States he has noticed her skin is dry and her nails are brittle also. Pt states this has worsened over the last few weeks.   11. Hypertension associated with diabetes ([place]) Pt has a history of medication noncompliance due to intolerance to medications. She has only been taking 50 mg of her apresoline daily and not the 75 mg ordered. She reports her blood pressure is elevated at times and low at times when home.     Relevant past medical, surgical, family, and social history reviewed and updated as indicated.  Allergies and medications reviewed and updated. Date reviewed: Chart in Epic.   Past Medical History:  Diagnosis  Date  . Arthritis   . Cancer ([place])    breast  . Cataract   . Diabetes mellitus without complication ([place])   . Frequent UTI   . Hyperlipidemia   . Hypertension   . Neck pain   . Scoliosis   . Stroke [facility])     Past Surgical History:  Procedure Laterality Date  . APPENDECTOMY    . BREAST LUMPECTOMY Left   . CHOLECYSTECTOMY    . JOINT REPLACEMENT     bilat knee   . REPLACEMENT TOTAL KNEE BILATERAL Bilateral   . TONSILLECTOMY      Social History   Socioeconomic History  . Marital status: Widowed    Spouse name: Not on file  . Number of children: 2  . Years of education: 53  . Highest education level: Some college, no degree  Occupational History  . Occupation: Retired    Fish farm manager: [employer]    Comment: Network engineer  Tobacco Use  . Smoking status: Never Smoker  . Smokeless tobacco: Never Used  Substance and Sexual Activity  . Alcohol use: No    Alcohol/week: 0.0 standard drinks  . Drug use: No  . Sexual activity: Not Currently  Other Topics Concern  . Not on file  Social History Narrative   Lives alone in a one story home.  Husband passed away. She has two sons 2 sons but.  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  Education: specialty courses with [employer], high school education.   Social Determinants of Health   Financial Resource Strain: Low Risk   . Difficulty of Paying Living Expenses: Not very hard  Food Insecurity: No Food Insecurity  . Worried About Charity fundraiser in the Last Year: Never true  . Ran Out of Food in the Last Year: Never true  Transportation Needs: No Transportation Needs  . Lack of Transportation (Medical): No  . Lack of Transportation (Non-Medical): No  Physical Activity: Inactive  . Days of Exercise per Week: 0 days  . Minutes of Exercise per Session: 0 min  Stress: Stress Concern Present  . Feeling of Stress : To some extent  Social Connections: Moderately Isolated  . Frequency of Communication with Friends and Family: Three  times a week  . Frequency of Social Gatherings with Friends and Family: Never  . Attends Religious Services: Never  . Active Member of Clubs or Organizations: No  . Attends Archivist Meetings: Never  . Marital Status: Widowed  Intimate Partner Violence: Not At Risk  . Fear of Current or Ex-Partner: No  . Emotionally Abused: No  . Physically Abused: No  . Sexually Abused: No    Outpatient Encounter Medications as of 06/22/2019  Medication Sig  . apixaban (ELIQUIS) 5 MG TABS tablet Take 1 tablet (5 mg total) by mouth 2 (two)  times daily.  . Cholecalciferol (VITAMIN D3) 5000 units CAPS Take 2,000 Units by mouth daily.   . DULoxetine (CYMBALTA) 20 MG capsule Take 1 capsule (20 mg total) by mouth daily.  Marland Kitchen glucose blood (ONE TOUCH ULTRA TEST) test strip USE TO check blood sugars twice daily  . Insulin Pen Needle 31G X 8 MM MISC Use once daily with lantus solostar  . LANTUS SOLOSTAR 100 UNIT/ML Solostar Pen INJECT 50 UNITS ONCE DAILY AT 10PM  . Magnesium 250 MG TABS Take 250 mg by mouth daily.  . metFORMIN (GLUCOPHAGE) 500 MG tablet TAKE (1) TABLET DAILY WITH BREAKFAST.  . nabumetone (RELAFEN) 500 MG tablet Take 1 tablet (500 mg total) by mouth 2 (two) times daily. For muscleand joint pain  . nitrofurantoin, macrocrystal-monohydrate, (MACROBID) 100 MG capsule   . nystatin cream (MYCOSTATIN) Apply 1 application topically 2 (two) times daily.  Marland Kitchen omeprazole (PRILOSEC) 20 MG capsule TAKE  (1)  CAPSULE  TWICE DAILY (TAKE ON AN EMPTY STOMACH AT LEAST 30 MIN- UTES BEFORE MEALS).  Marland Kitchen ondansetron (ZOFRAN) 4 MG tablet TAKE 1 TABLET EVERY 8 HOURS AS NEEDED FOR NAUSEA & VOMITING  . potassium chloride (KLOR-CON) 10 MEQ tablet Take 1 tablet (10 mEq total) by mouth daily.  . SUPER B COMPLEX/C PO Take 1 tablet by mouth daily.  . D-Mannose 500 MG CAPS Take 500 mg by mouth daily.  . hydrALAZINE (APRESOLINE) 25 MG tablet Take 1 tablet (25 mg total) by mouth 2 (two) times daily.  . hydrALAZINE  (APRESOLINE) 50 MG tablet Take 1 tablet (50 mg total) by mouth 2 (two) times daily.  Marland Kitchen rOPINIRole (REQUIP) 0.25 MG tablet Take 1 tablet (0.25 mg total) by mouth 3 (three) times daily. Take one tablet in the morning, one tablet at noon and two tablets at bedtime.  . [DISCONTINUED] Insulin Glargine (LANTUS SOLOSTAR) 100 UNIT/ML Solostar Pen INJECT 50 UNITS ONCE DAILY AT 10PM  . [EXPIRED] ketorolac (TORADOL) 30 MG/ML injection 30 mg   . [EXPIRED] methylPREDNISolone acetate (DEPO-MEDROL) injection 40 mg    No facility-administered encounter medications on file as of 06/22/2019.    Allergies  Allergen Reactions  . Diltiazem Hcl     Red itchy rash started a few hours after taking short acting 145m dilt tabs  . Ace Inhibitors Cough    Review of Systems  Constitutional: Positive for fatigue. Negative for activity change, appetite change, chills, diaphoresis, fever and unexpected weight change.  HENT: Negative.   Eyes: Negative.   Respiratory: Negative for cough, chest tightness, shortness of breath and wheezing.   Cardiovascular: Negative for chest pain, palpitations and leg swelling.  Gastrointestinal: Negative for abdominal pain, blood in stool, constipation, diarrhea, nausea and vomiting.  Endocrine: Negative.  Negative for cold intolerance, heat intolerance, polydipsia, polyphagia and polyuria.  Genitourinary: Negative for decreased urine volume, difficulty urinating, dysuria, frequency and urgency.  Musculoskeletal: Positive for arthralgias, back pain, gait problem and myalgias. Negative for joint swelling, neck pain and neck stiffness.  Skin:       Dry skin, brittle nails, thinning hair  Allergic/Immunologic: Negative.   Neurological: Positive for weakness (generalized). Negative for dizziness, tremors, seizures, syncope, facial asymmetry, speech difficulty, light-headedness, numbness and headaches.  Hematological: Negative.   Psychiatric/Behavioral: Negative for confusion,  hallucinations, sleep disturbance and suicidal ideas.  All other systems reviewed and are negative.       Objective:  BP (!) 181/100   Pulse 74   Temp 98.2 F (36.8 C) (Temporal)   Ht 4'  10" (1.473 m)   Wt 150 lb (68 kg)   SpO2 95%   BMI 31.35 kg/m    Wt Readings from Last 3 Encounters:  06/22/19 150 lb (68 kg)  03/04/19 153 lb (69.4 kg)  12/29/18 154 lb 9.6 oz (70.1 kg)    Physical Exam Vitals and nursing note reviewed.  Constitutional:      General: She is not in acute distress.    Appearance: Normal appearance. She is well-developed and well-groomed. She is obese. She is ill-appearing. She is not toxic-appearing or diaphoretic.  HENT:     Head: Normocephalic and atraumatic.     Jaw: There is normal jaw occlusion.     Right Ear: Hearing normal.     Left Ear: Hearing normal.     Nose: Nose normal.     Mouth/Throat:     Lips: Pink.     Mouth: Mucous membranes are moist.     Pharynx: Oropharynx is clear. Uvula midline.  Eyes:     General: Lids are normal.     Extraocular Movements: Extraocular movements intact.     Conjunctiva/sclera: Conjunctivae normal.     Pupils: Pupils are equal, round, and reactive to light.  Neck:     Thyroid: No thyroid mass, thyromegaly or thyroid tenderness.     Vascular: No carotid bruit or JVD.     Trachea: Trachea and phonation normal.  Cardiovascular:     Rate and Rhythm: Normal rate. Rhythm irregular.     Chest Wall: PMI is not displaced.     Pulses: Normal pulses.     Heart sounds: Normal heart sounds. No murmur. No friction rub. No gallop.   Pulmonary:     Effort: Pulmonary effort is normal. No respiratory distress.     Breath sounds: Normal breath sounds. No wheezing.  Abdominal:     General: Bowel sounds are normal. There is no distension or abdominal bruit.     Palpations: Abdomen is soft. There is no hepatomegaly or splenomegaly.     Tenderness: There is no abdominal tenderness. There is no right CVA tenderness or left CVA  tenderness.     Hernia: No hernia is present.  Musculoskeletal:        General: Normal range of motion.     Cervical back: Normal range of motion and neck supple.     Right lower leg: 1+ Edema present.     Left lower leg: 1+ Edema present.  Lymphadenopathy:     Cervical: No cervical adenopathy.  Skin:    General: Skin is warm and dry.     Capillary Refill: Capillary refill takes less than 2 seconds.     Coloration: Skin is not cyanotic, jaundiced or pale.     Findings: No rash.  Neurological:     General: No focal deficit present.     Mental Status: She is alert and oriented to person, place, and time.     Cranial Nerves: Cranial nerves are intact. No cranial nerve deficit.     Sensory: Sensation is intact. No sensory deficit.     Motor: Weakness (generalized) present.     Coordination: Coordination is intact. Coordination normal.     Gait: Gait abnormal (antalgic, uses walker).     Deep Tendon Reflexes: Reflexes are normal and symmetric. Reflexes normal.  Psychiatric:        Attention and Perception: Attention and perception normal.        Mood and Affect: Mood and affect normal.  Speech: Speech normal.        Behavior: Behavior normal. Behavior is cooperative.        Thought Content: Thought content normal.        Cognition and Memory: Cognition and memory normal.        Judgment: Judgment normal.     Results for orders placed or performed in visit on 06/22/19  CMP14+EGFR  Result Value Ref Range   Glucose 91 65 - 99 mg/dL   BUN 21 10 - 36 mg/dL   Creatinine, Ser 1.00 0.57 - 1.00 mg/dL   GFR calc non Af Amer 49 (L) >59 mL/min/1.73   GFR calc Af Amer 57 (L) >59 mL/min/1.73   BUN/Creatinine Ratio 21 12 - 28   Sodium 143 134 - 144 mmol/L   Potassium 4.1 3.5 - 5.2 mmol/L   Chloride 99 96 - 106 mmol/L   CO2 29 20 - 29 mmol/L   Calcium 9.5 8.7 - 10.3 mg/dL   Total Protein 6.7 6.0 - 8.5 g/dL   Albumin 4.2 3.5 - 4.6 g/dL   Globulin, Total 2.5 1.5 - 4.5 g/dL    Albumin/Globulin Ratio 1.7 1.2 - 2.2   Bilirubin Total 0.5 0.0 - 1.2 mg/dL   Alkaline Phosphatase 69 39 - 117 IU/L   AST 24 0 - 40 IU/L   ALT 18 0 - 32 IU/L  CBC with Differential/Platelet  Result Value Ref Range   WBC 7.1 3.4 - 10.8 x10E3/uL   RBC 5.46 (H) 3.77 - 5.28 x10E6/uL   Hemoglobin 14.5 11.1 - 15.9 g/dL   Hematocrit 44.8 34.0 - 46.6 %   MCV 82 79 - 97 fL   MCH 26.6 26.6 - 33.0 pg   MCHC 32.4 31.5 - 35.7 g/dL   RDW 15.7 (H) 11.7 - 15.4 %   Platelets 249 150 - 450 x10E3/uL   Neutrophils 72 Not Estab. %   Lymphs 13 Not Estab. %   Monocytes 10 Not Estab. %   Eos 4 Not Estab. %   Basos 1 Not Estab. %   Neutrophils Absolute 5.1 1.4 - 7.0 x10E3/uL   Lymphocytes Absolute 0.9 0.7 - 3.1 x10E3/uL   Monocytes Absolute 0.7 0.1 - 0.9 x10E3/uL   EOS (ABSOLUTE) 0.3 0.0 - 0.4 x10E3/uL   Basophils Absolute 0.1 0.0 - 0.2 x10E3/uL   Immature Granulocytes 0 Not Estab. %   Immature Grans (Abs) 0.0 0.0 - 0.1 x10E3/uL  Lipid panel  Result Value Ref Range   Cholesterol, Total 255 (H) 100 - 199 mg/dL   Triglycerides 105 0 - 149 mg/dL   HDL 42 >39 mg/dL   VLDL Cholesterol Cal 19 5 - 40 mg/dL   LDL Chol Calc (NIH) 194 (H) 0 - 99 mg/dL   Lipid Comment: Comment    Chol/HDL Ratio 6.1 (H) 0.0 - 4.4 ratio  Thyroid Panel With TSH  Result Value Ref Range   TSH 1.190 0.450 - 4.500 uIU/mL   T4, Total 7.0 4.5 - 12.0 ug/dL   T3 Uptake Ratio 29 24 - 39 %   Free Thyroxine Index 2.0 1.2 - 4.9  Vitamin B12  Result Value Ref Range   Vitamin B-12 1,180 232 - 1,245 pg/mL  VITAMIN D 25 Hydroxy (Vit-D Deficiency, Fractures)  Result Value Ref Range   Vit D, 25-Hydroxy 66.9 30.0 - 100.0 ng/mL  Bayer DCA Hb A1c Waived  Result Value Ref Range   HB A1C (BAYER DCA - WAIVED) 6.1 <7.0 %  Pertinent labs & imaging results that were available during my care of the patient were reviewed by me and considered in my medical decision making.  Assessment & Plan:  Kristopher was seen today for pain in both hips and  medical management of chronic issues.  Diagnoses and all orders for this visit:  Chronic left hip pain Right hip pain Chronic pain of right knee Ongoing and worsening pain. Pt is prone to falls, so sedating medications are contraindicated as she is also on Eliquis. Pt appears uncomfortable in office today. Will give burst of steroids and low dose IM NSAIDs to see if beneficial. Will place referral to ortho for further evaluations and management options.  -     Vitamin B12 -     VITAMIN D 25 Hydroxy (Vit-D Deficiency, Fractures) -     methylPREDNISolone acetate (DEPO-MEDROL) injection 40 mg -     ketorolac (TORADOL) 30 MG/ML injection 30 mg -     Ambulatory referral to Orthopedic Surgery  Chronic diastolic heart failure ([place]) Doing well. Labs pending. Pt to keep follow up with cardiology on a regular basis.  -     CMP14+EGFR -     CBC with Differential/Platelet  Depression, recurrent ([place]) Doing well. Will continue current regimen.  -     Thyroid Panel With TSH  Diabetic polyneuropathy associated with type 2 diabetes mellitus ([place]) Doing well with Requip. Will continue. Pt aware of sedation precautions and to only take as needed for pain.  -     CMP14+EGFR  Permanent atrial fibrillation ([place]) Doing well. Keep follow up with cardiology as scheduled.  -     CBC with Differential/Platelet  Type 2 diabetes mellitus with diabetic neuropathy, with long-term current use of insulin (HCC) A1C 6.1. No changes in regimen. Labs pending. Follow up in 3 months or sooner if needed.  -     CMP14+EGFR -     CBC with Differential/Platelet -     Lipid panel -     Thyroid Panel With TSH -     Bayer DCA Hb A1c Waived  Hyperlipidemia associated with type 2 diabetes mellitus ([place]) Diet controlled due to statin intolerance. Labs pending. Diet and exercise encouraged.  -     Lipid panel  Other fatigue Physical deconditioning Labs pending. Pt declined PT in home. Pt to discuss this with family and  determine if she would like them to return for conditioning, strengthening, or mobility training.  -     CMP14+EGFR -     CBC with Differential/Platelet -     Thyroid Panel With TSH -     Vitamin B12 -     VITAMIN D 25 Hydroxy (Vit-D Deficiency, Fractures)  Hypertension associated with diabetes ([place]) Poorly controlled. Noncompliance with medication regimen due to reported side effects. Pt aware of importance of proper blood pressure control. Labs pending. Apresoline as prescribed for blood pressure along with DASH diet.  -     CMP14+EGFR -     CBC with Differential/Platelet -     Lipid panel -     Thyroid Panel With TSH  Total time spent with patient 50 minutes.  Greater than 50% of encounter spent in coordination of care/counseling.    Continue all other maintenance medications.  Follow up plan: Return in about 4 weeks (around 07/20/2019), or if symptoms worsen or fail to improve.  Continue healthy lifestyle choices, including diet (rich in fruits, vegetables, and lean proteins, and low in salt and simple  carbohydrates) and exercise (at least 30 minutes of moderate physical activity daily).  Educational handout given for chronic pain  The above assessment and management plan was discussed with the patient. The patient verbalized understanding of and has agreed to the management plan. Patient is aware to call the clinic if they develop any new symptoms or if symptoms persist or worsen. Patient is aware when to return to the clinic for a follow-up visit. Patient educated on when it is appropriate to go to the emergency department.   Monia Pouch, FNP-[facility] [phone]

## 2019-06-23 LAB — LIPID PANEL
Chol/HDL Ratio: 6.1 ratio — ABNORMAL HIGH (ref 0.0–4.4)
Cholesterol, Total: 255 mg/dL — ABNORMAL HIGH (ref 100–199)
HDL: 42 mg/dL (ref >39)
LDL Chol Calc (NIH): 194 mg/dL — ABNORMAL HIGH (ref 0–99)
Triglycerides: 105 mg/dL (ref 0–149)
VLDL Cholesterol Cal: 19 mg/dL (ref 5–40)

## 2019-06-23 LAB — CBC WITH DIFFERENTIAL/PLATELET
Basophils Absolute: 0.1 10*3/uL (ref 0.0–0.2)
Basos: 1 %
EOS (ABSOLUTE): 0.3 10*3/uL (ref 0.0–0.4)
Eos: 4 %
Hematocrit: 44.8 % (ref 34.0–46.6)
Hemoglobin: 14.5 g/dL (ref 11.1–15.9)
Immature Grans (Abs): 0 10*3/uL (ref 0.0–0.1)
Immature Granulocytes: 0 %
Lymphocytes Absolute: 0.9 10*3/uL (ref 0.7–3.1)
Lymphs: 13 %
MCH: 26.6 pg (ref 26.6–33.0)
MCHC: 32.4 g/dL (ref 31.5–35.7)
MCV: 82 fL (ref 79–97)
Monocytes Absolute: 0.7 10*3/uL (ref 0.1–0.9)
Monocytes: 10 %
Neutrophils Absolute: 5.1 10*3/uL (ref 1.4–7.0)
Neutrophils: 72 %
Platelets: 249 10*3/uL (ref 150–450)
RBC: 5.46 x10E6/uL — ABNORMAL HIGH (ref 3.77–5.28)
RDW: 15.7 % — ABNORMAL HIGH (ref 11.7–15.4)
WBC: 7.1 10*3/uL (ref 3.4–10.8)

## 2019-06-23 LAB — CMP14+EGFR
ALT: 18 IU/L (ref 0–32)
AST: 24 IU/L (ref 0–40)
Albumin/Globulin Ratio: 1.7 (ref 1.2–2.2)
Albumin: 4.2 g/dL (ref 3.5–4.6)
Alkaline Phosphatase: 69 IU/L (ref 39–117)
BUN/Creatinine Ratio: 21 (ref 12–28)
BUN: 21 mg/dL (ref 10–36)
Bilirubin Total: 0.5 mg/dL (ref 0.0–1.2)
CO2: 29 mmol/L (ref 20–29)
Calcium: 9.5 mg/dL (ref 8.7–10.3)
Chloride: 99 mmol/L (ref 96–106)
Creatinine, Ser: 1 mg/dL (ref 0.57–1.00)
GFR calc Af Amer: 57 mL/min/{1.73_m2} — ABNORMAL LOW (ref >59)
GFR calc non Af Amer: 49 mL/min/{1.73_m2} — ABNORMAL LOW (ref >59)
Globulin, Total: 2.5 g/dL (ref 1.5–4.5)
Glucose: 91 mg/dL (ref 65–99)
Potassium: 4.1 mmol/L (ref 3.5–5.2)
Sodium: 143 mmol/L (ref 134–144)
Total Protein: 6.7 g/dL (ref 6.0–8.5)

## 2019-06-23 LAB — THYROID PANEL WITH TSH
Free Thyroxine Index: 2 (ref 1.2–4.9)
T3 Uptake Ratio: 29 % (ref 24–39)
T4, Total: 7 ug/dL (ref 4.5–12.0)
TSH: 1.19 u[IU]/mL (ref 0.450–4.500)

## 2019-06-23 LAB — VITAMIN D 25 HYDROXY (VIT D DEFICIENCY, FRACTURES): Vit D, 25-Hydroxy: 66.9 ng/mL (ref 30.0–100.0)

## 2019-06-23 LAB — VITAMIN B12: Vitamin B-12: 1180 pg/mL (ref 232–1245)

## 2019-06-23 MED ORDER — HYDRALAZINE HCL 50 MG PO TABS: 50.0000 mg | ORAL_TABLET | Freq: Two times a day (BID) | ORAL | 3 refills | Status: DC

## 2019-06-23 MED ORDER — ROPINIROLE HCL 0.25 MG PO TABS: 0.2500 mg | ORAL_TABLET | Freq: Three times a day (TID) | ORAL | 3 refills | Status: DC

## 2019-06-23 MED ORDER — HYDRALAZINE HCL 25 MG PO TABS: 25.0000 mg | ORAL_TABLET | Freq: Two times a day (BID) | ORAL | 3 refills | Status: DC

## 2019-06-26 ENCOUNTER — Emergency Department (HOSPITAL_COMMUNITY): Payer: PPO

## 2019-06-26 ENCOUNTER — Inpatient Hospital Stay (HOSPITAL_COMMUNITY)
Admission: EM | Admit: 2019-06-26 | Discharge: 2019-07-01 | DRG: 064 | Disposition: A | Discharge status: Skilled Nursing Facility | Payer: PPO | Attending: Internal Medicine | Admitting: Internal Medicine

## 2019-06-26 ENCOUNTER — Other Ambulatory Visit: Payer: Self-pay

## 2019-06-26 ENCOUNTER — Encounter (HOSPITAL_COMMUNITY): Payer: Self-pay | Admitting: Emergency Medicine

## 2019-06-26 ENCOUNTER — Observation Stay (HOSPITAL_COMMUNITY): Payer: PPO

## 2019-06-26 ENCOUNTER — Encounter: Payer: Self-pay | Admitting: Family Medicine

## 2019-06-26 DIAGNOSIS — E1151 Type 2 diabetes mellitus with diabetic peripheral angiopathy without gangrene: Secondary | ICD-10-CM | POA: Diagnosis not present

## 2019-06-26 DIAGNOSIS — E1159 Type 2 diabetes mellitus with other circulatory complications: Secondary | ICD-10-CM | POA: Diagnosis not present

## 2019-06-26 DIAGNOSIS — Z03818 Encounter for observation for suspected exposure to other biological agents ruled out: Secondary | ICD-10-CM | POA: Diagnosis not present

## 2019-06-26 DIAGNOSIS — Z8249 Family history of ischemic heart disease and other diseases of the circulatory system: Secondary | ICD-10-CM | POA: Diagnosis not present

## 2019-06-26 DIAGNOSIS — Z20822 Contact with and (suspected) exposure to covid-19: Secondary | ICD-10-CM | POA: Diagnosis not present

## 2019-06-26 DIAGNOSIS — I639 Cerebral infarction, unspecified: Secondary | ICD-10-CM | POA: Diagnosis not present

## 2019-06-26 DIAGNOSIS — M810 Age-related osteoporosis without current pathological fracture: Secondary | ICD-10-CM | POA: Diagnosis not present

## 2019-06-26 DIAGNOSIS — Z794 Long term (current) use of insulin: Secondary | ICD-10-CM

## 2019-06-26 DIAGNOSIS — Z66 Do not resuscitate: Secondary | ICD-10-CM | POA: Diagnosis present

## 2019-06-26 DIAGNOSIS — R29701 NIHSS score 1: Secondary | ICD-10-CM | POA: Diagnosis present

## 2019-06-26 DIAGNOSIS — Z7901 Long term (current) use of anticoagulants: Secondary | ICD-10-CM

## 2019-06-26 DIAGNOSIS — I5032 Chronic diastolic (congestive) heart failure: Secondary | ICD-10-CM | POA: Diagnosis not present

## 2019-06-26 DIAGNOSIS — R4182 Altered mental status, unspecified: Secondary | ICD-10-CM | POA: Diagnosis not present

## 2019-06-26 DIAGNOSIS — E876 Hypokalemia: Secondary | ICD-10-CM | POA: Diagnosis not present

## 2019-06-26 DIAGNOSIS — J8410 Pulmonary fibrosis, unspecified: Secondary | ICD-10-CM | POA: Diagnosis present

## 2019-06-26 DIAGNOSIS — I110 Hypertensive heart disease with heart failure: Secondary | ICD-10-CM | POA: Diagnosis present

## 2019-06-26 DIAGNOSIS — M419 Scoliosis, unspecified: Secondary | ICD-10-CM | POA: Diagnosis not present

## 2019-06-26 DIAGNOSIS — S065X0D Traumatic subdural hemorrhage without loss of consciousness, subsequent encounter: Secondary | ICD-10-CM | POA: Diagnosis not present

## 2019-06-26 DIAGNOSIS — E1142 Type 2 diabetes mellitus with diabetic polyneuropathy: Secondary | ICD-10-CM | POA: Diagnosis not present

## 2019-06-26 DIAGNOSIS — Z515 Encounter for palliative care: Secondary | ICD-10-CM | POA: Diagnosis not present

## 2019-06-26 DIAGNOSIS — Z888 Allergy status to other drugs, medicaments and biological substances status: Secondary | ICD-10-CM

## 2019-06-26 DIAGNOSIS — Z8673 Personal history of transient ischemic attack (TIA), and cerebral infarction without residual deficits: Secondary | ICD-10-CM | POA: Diagnosis not present

## 2019-06-26 DIAGNOSIS — G9341 Metabolic encephalopathy: Secondary | ICD-10-CM | POA: Diagnosis present

## 2019-06-26 DIAGNOSIS — E1169 Type 2 diabetes mellitus with other specified complication: Secondary | ICD-10-CM | POA: Diagnosis present

## 2019-06-26 DIAGNOSIS — Z801 Family history of malignant neoplasm of trachea, bronchus and lung: Secondary | ICD-10-CM

## 2019-06-26 DIAGNOSIS — S52125D Nondisplaced fracture of head of left radius, subsequent encounter for closed fracture with routine healing: Secondary | ICD-10-CM | POA: Diagnosis not present

## 2019-06-26 DIAGNOSIS — I152 Hypertension secondary to endocrine disorders: Secondary | ICD-10-CM | POA: Diagnosis not present

## 2019-06-26 DIAGNOSIS — G894 Chronic pain syndrome: Secondary | ICD-10-CM

## 2019-06-26 DIAGNOSIS — I6389 Other cerebral infarction: Secondary | ICD-10-CM | POA: Diagnosis not present

## 2019-06-26 DIAGNOSIS — Z8261 Family history of arthritis: Secondary | ICD-10-CM

## 2019-06-26 DIAGNOSIS — M1990 Unspecified osteoarthritis, unspecified site: Secondary | ICD-10-CM | POA: Diagnosis present

## 2019-06-26 DIAGNOSIS — I63312 Cerebral infarction due to thrombosis of left middle cerebral artery: Secondary | ICD-10-CM

## 2019-06-26 DIAGNOSIS — E669 Obesity, unspecified: Secondary | ICD-10-CM | POA: Diagnosis present

## 2019-06-26 DIAGNOSIS — G8929 Other chronic pain: Secondary | ICD-10-CM | POA: Diagnosis not present

## 2019-06-26 DIAGNOSIS — I700 Atherosclerosis of aorta: Secondary | ICD-10-CM | POA: Diagnosis present

## 2019-06-26 DIAGNOSIS — Z800 Family history of malignant neoplasm of digestive organs: Secondary | ICD-10-CM

## 2019-06-26 DIAGNOSIS — M25552 Pain in left hip: Secondary | ICD-10-CM | POA: Diagnosis not present

## 2019-06-26 DIAGNOSIS — R509 Fever, unspecified: Secondary | ICD-10-CM | POA: Diagnosis not present

## 2019-06-26 DIAGNOSIS — I63233 Cerebral infarction due to unspecified occlusion or stenosis of bilateral carotid arteries: Secondary | ICD-10-CM | POA: Diagnosis not present

## 2019-06-26 DIAGNOSIS — M25561 Pain in right knee: Secondary | ICD-10-CM | POA: Diagnosis not present

## 2019-06-26 DIAGNOSIS — F339 Major depressive disorder, recurrent, unspecified: Secondary | ICD-10-CM | POA: Diagnosis present

## 2019-06-26 DIAGNOSIS — R278 Other lack of coordination: Secondary | ICD-10-CM | POA: Diagnosis not present

## 2019-06-26 DIAGNOSIS — G9340 Encephalopathy, unspecified: Secondary | ICD-10-CM | POA: Diagnosis not present

## 2019-06-26 DIAGNOSIS — Z6831 Body mass index (BMI) 31.0-31.9, adult: Secondary | ICD-10-CM

## 2019-06-26 DIAGNOSIS — K219 Gastro-esophageal reflux disease without esophagitis: Secondary | ICD-10-CM | POA: Diagnosis present

## 2019-06-26 DIAGNOSIS — M797 Fibromyalgia: Secondary | ICD-10-CM | POA: Diagnosis present

## 2019-06-26 DIAGNOSIS — Z96653 Presence of artificial knee joint, bilateral: Secondary | ICD-10-CM | POA: Diagnosis not present

## 2019-06-26 DIAGNOSIS — M6281 Muscle weakness (generalized): Secondary | ICD-10-CM | POA: Diagnosis not present

## 2019-06-26 DIAGNOSIS — I10 Essential (primary) hypertension: Secondary | ICD-10-CM | POA: Diagnosis not present

## 2019-06-26 DIAGNOSIS — Z811 Family history of alcohol abuse and dependence: Secondary | ICD-10-CM

## 2019-06-26 DIAGNOSIS — I69398 Other sequelae of cerebral infarction: Secondary | ICD-10-CM | POA: Diagnosis not present

## 2019-06-26 DIAGNOSIS — Z853 Personal history of malignant neoplasm of breast: Secondary | ICD-10-CM

## 2019-06-26 DIAGNOSIS — I4821 Permanent atrial fibrillation: Secondary | ICD-10-CM | POA: Diagnosis not present

## 2019-06-26 DIAGNOSIS — E785 Hyperlipidemia, unspecified: Secondary | ICD-10-CM | POA: Diagnosis present

## 2019-06-26 DIAGNOSIS — M1612 Unilateral primary osteoarthritis, left hip: Secondary | ICD-10-CM | POA: Diagnosis present

## 2019-06-26 DIAGNOSIS — G250 Essential tremor: Secondary | ICD-10-CM | POA: Diagnosis present

## 2019-06-26 DIAGNOSIS — Z7189 Other specified counseling: Secondary | ICD-10-CM | POA: Diagnosis not present

## 2019-06-26 HISTORY — DX: Altered mental status, unspecified: R41.82

## 2019-06-26 HISTORY — DX: Unspecified atrial fibrillation: I48.91

## 2019-06-26 LAB — URINALYSIS, ROUTINE W REFLEX MICROSCOPIC
Bilirubin Urine: NEGATIVE
Glucose, UA: NEGATIVE mg/dL
Hgb urine dipstick: NEGATIVE
Ketones, ur: NEGATIVE mg/dL
Leukocytes,Ua: NEGATIVE
Nitrite: NEGATIVE
Protein, ur: 100 mg/dL — AB
Specific Gravity, Urine: 1.021 (ref 1.005–1.030)
pH: 6 (ref 5.0–8.0)

## 2019-06-26 LAB — CBC WITH DIFFERENTIAL/PLATELET
Abs Immature Granulocytes: 0.03 10*3/uL (ref 0.00–0.07)
Basophils Absolute: 0 10*3/uL (ref 0.0–0.1)
Basophils Relative: 0 %
Eosinophils Absolute: 0.1 10*3/uL (ref 0.0–0.5)
Eosinophils Relative: 1 %
HCT: 48.8 % — ABNORMAL HIGH (ref 36.0–46.0)
Hemoglobin: 15.2 g/dL — ABNORMAL HIGH (ref 12.0–15.0)
Immature Granulocytes: 0 %
Lymphocytes Relative: 3 %
Lymphs Abs: 0.3 10*3/uL — ABNORMAL LOW (ref 0.7–4.0)
MCH: 25.9 pg — ABNORMAL LOW (ref 26.0–34.0)
MCHC: 31.1 g/dL (ref 30.0–36.0)
MCV: 83.1 fL (ref 80.0–100.0)
Monocytes Absolute: 0.8 10*3/uL (ref 0.1–1.0)
Monocytes Relative: 9 %
Neutro Abs: 7.7 10*3/uL (ref 1.7–7.7)
Neutrophils Relative %: 87 %
Platelets: 229 10*3/uL (ref 150–400)
RBC: 5.87 MIL/uL — ABNORMAL HIGH (ref 3.87–5.11)
RDW: 17.2 % — ABNORMAL HIGH (ref 11.5–15.5)
WBC: 8.9 10*3/uL (ref 4.0–10.5)
nRBC: 0 % (ref 0.0–0.2)

## 2019-06-26 LAB — COMPREHENSIVE METABOLIC PANEL
ALT: 22 U/L (ref 0–44)
AST: 24 U/L (ref 15–41)
Albumin: 3.9 g/dL (ref 3.5–5.0)
Alkaline Phosphatase: 55 U/L (ref 38–126)
Anion gap: 10 (ref 5–15)
BUN: 18 mg/dL (ref 8–23)
CO2: 31 mmol/L (ref 22–32)
Calcium: 9.2 mg/dL (ref 8.9–10.3)
Chloride: 97 mmol/L — ABNORMAL LOW (ref 98–111)
Creatinine, Ser: 0.95 mg/dL (ref 0.44–1.00)
GFR calc Af Amer: >60 mL/min (ref >60)
GFR calc non Af Amer: 52 mL/min — ABNORMAL LOW (ref >60)
Glucose, Bld: 165 mg/dL — ABNORMAL HIGH (ref 70–99)
Potassium: 3.2 mmol/L — ABNORMAL LOW (ref 3.5–5.1)
Sodium: 138 mmol/L (ref 135–145)
Total Bilirubin: 0.8 mg/dL (ref 0.3–1.2)
Total Protein: 7.1 g/dL (ref 6.5–8.1)

## 2019-06-26 LAB — FOLATE: Folate: 19.7 ng/mL (ref >5.9)

## 2019-06-26 LAB — VITAMIN B12: Vitamin B-12: 808 pg/mL (ref 180–914)

## 2019-06-26 LAB — GLUCOSE, CAPILLARY: Glucose-Capillary: 163 mg/dL — ABNORMAL HIGH (ref 70–99)

## 2019-06-26 LAB — TSH: TSH: 0.683 u[IU]/mL (ref 0.350–4.500)

## 2019-06-26 MED ORDER — SODIUM CHLORIDE 0.9 % IV BOLUS
500.0000 mL | Freq: Once | INTRAVENOUS | Status: AC
  Administered 2019-06-26: 500 mL via INTRAVENOUS

## 2019-06-26 MED ORDER — ACETAMINOPHEN 650 MG RE SUPP: 650.0000 mg | Freq: Four times a day (QID) | RECTAL | Status: DC | PRN

## 2019-06-26 MED ORDER — DULOXETINE HCL 20 MG PO CPEP
20.0000 mg | ORAL_CAPSULE | Freq: Every day | ORAL | Status: DC
  Administered 2019-06-26 – 2019-07-01 (×6): 20 mg via ORAL
  Filled 2019-06-26 (×6): qty 1

## 2019-06-26 MED ORDER — APIXABAN 5 MG PO TABS
5.0000 mg | ORAL_TABLET | Freq: Two times a day (BID) | ORAL | Status: DC
  Administered 2019-06-26 – 2019-07-01 (×10): 5 mg via ORAL
  Filled 2019-06-26 (×11): qty 1

## 2019-06-26 MED ORDER — INSULIN ASPART 100 UNIT/ML ~~LOC~~ SOLN
0.0000 [IU] | Freq: Three times a day (TID) | SUBCUTANEOUS | Status: DC
  Administered 2019-06-27: 2 [IU] via SUBCUTANEOUS
  Administered 2019-06-27: 1 [IU] via SUBCUTANEOUS

## 2019-06-26 MED ORDER — VITAMIN D 25 MCG (1000 UNIT) PO TABS
2000.0000 [IU] | ORAL_TABLET | Freq: Every day | ORAL | Status: DC
  Administered 2019-06-26 – 2019-07-01 (×6): 2000 [IU] via ORAL
  Filled 2019-06-26 (×10): qty 2

## 2019-06-26 MED ORDER — POTASSIUM CHLORIDE CRYS ER 20 MEQ PO TBCR
40.0000 meq | EXTENDED_RELEASE_TABLET | Freq: Once | ORAL | Status: AC
  Administered 2019-06-26: 40 meq via ORAL
  Filled 2019-06-26: qty 2

## 2019-06-26 MED ORDER — PANTOPRAZOLE SODIUM 40 MG PO TBEC
40.0000 mg | DELAYED_RELEASE_TABLET | Freq: Every day | ORAL | Status: DC
  Administered 2019-06-26 – 2019-07-01 (×6): 40 mg via ORAL
  Filled 2019-06-26 (×6): qty 1

## 2019-06-26 MED ORDER — ACETAMINOPHEN 325 MG PO TABS
650.0000 mg | ORAL_TABLET | Freq: Once | ORAL | Status: AC
  Administered 2019-06-26: 650 mg via ORAL
  Filled 2019-06-26 (×2): qty 2

## 2019-06-26 MED ORDER — ROPINIROLE HCL 0.25 MG PO TABS
0.2500 mg | ORAL_TABLET | Freq: Three times a day (TID) | ORAL | Status: DC
  Administered 2019-06-26 – 2019-07-01 (×14): 0.25 mg via ORAL
  Filled 2019-06-26 (×18): qty 1

## 2019-06-26 MED ORDER — SODIUM CHLORIDE 0.9 % IV SOLN: INTRAVENOUS | Status: AC

## 2019-06-26 MED ORDER — ACETAMINOPHEN 325 MG PO TABS
650.0000 mg | ORAL_TABLET | Freq: Four times a day (QID) | ORAL | Status: DC | PRN
  Administered 2019-06-27 – 2019-07-01 (×3): 650 mg via ORAL
  Filled 2019-06-26 (×3): qty 2

## 2019-06-26 MED ORDER — INSULIN GLARGINE 100 UNIT/ML ~~LOC~~ SOLN
30.0000 [IU] | Freq: Every day | SUBCUTANEOUS | Status: DC
  Administered 2019-06-26 – 2019-06-27 (×2): 30 [IU] via SUBCUTANEOUS
  Filled 2019-06-26 (×5): qty 0.3

## 2019-06-26 MED ORDER — HYDRALAZINE HCL 50 MG PO TABS
50.0000 mg | ORAL_TABLET | Freq: Two times a day (BID) | ORAL | Status: DC
  Administered 2019-06-26 – 2019-06-27 (×2): 50 mg via ORAL
  Filled 2019-06-26 (×2): qty 1

## 2019-06-26 MED ORDER — IBUPROFEN 200 MG PO TABS
200.0000 mg | ORAL_TABLET | Freq: Four times a day (QID) | ORAL | Status: DC | PRN
  Administered 2019-06-27 – 2019-06-28 (×2): 200 mg via ORAL
  Filled 2019-06-26 (×2): qty 1

## 2019-06-26 NOTE — ED Provider Notes (Addendum)
[facility] EMERGENCY DEPARTMENT Provider Note   CSN: [id number] Arrival date & time: 06/26/19  1430     History Chief Complaint  Patient presents with  . Altered Mental Status    [name] is a 86 y.o. female.  Level 5 caveat for altered mental status.  Most of history obtained from son.  He reports his mother has been "shaking" with increased confusion for the past 7 days, worse past 24 hours.  She also complains of pain in her hips but this is a chronic problem.  No specific chest pain, dyspnea, cough, dysuria, stiff neck.  She lives independently and is able to do minimal ADLs.        Past Medical History:  Diagnosis Date  . Arthritis   . Cancer ([place])    breast  . Cataract   . Diabetes mellitus without complication ([place])   . Frequent UTI   . Hyperlipidemia   . Hypertension   . Neck pain   . Scoliosis   . Stroke [facility])     Patient Active Problem List   Diagnosis Date Noted  . Right hip pain 06/22/2019  . Chronic left hip pain 05/25/2019  . Depression, recurrent ([place]) 05/25/2019  . Chronic anticoagulation 04/19/2019  . Leg cramps 03/04/2019  . Short-term memory loss 03/04/2019  . DOE (dyspnea on exertion) 03/04/2019  . Aortic atherosclerosis ([place]) 03/04/2019  . Physical deconditioning 01/24/2019  . History of recurrent UTIs 12/03/2018  . Chronic pain of right knee 10/08/2018  . Type 2 diabetes mellitus with diabetic neuropathy, unspecified ([place]) 08/12/2018  . Pulmonary vascular congestion   . Chronic diastolic CHF (congestive heart failure) ([place])   . Acute hypoxemic respiratory failure ([place]) 12/06/2017  . Permanent atrial fibrillation ([place]) 12/06/2017  . Trochanteric bursitis, right hip 07/16/2017  . Other intervertebral disc degeneration, lumbar region 07/16/2017  . Osteoporosis 11/04/2016  . Diabetic polyneuropathy associated with type 2 diabetes mellitus ([place]) 11/14/2014  . Fibromyalgia 02/11/2013  . Cerebral artery occlusion with cerebral  infarction ([place]) 08/01/2008  . Osteoarthritis 04/09/2007  . Hyperlipidemia associated with type 2 diabetes mellitus ([place]) 03/08/2007  . Hypertension associated with diabetes ([place]) 03/08/2007  . Pulmonary fibrosis ([place]) 03/08/2007  . GERD 03/08/2007  . BREAST CANCER, HX OF 03/08/2007    Past Surgical History:  Procedure Laterality Date  . APPENDECTOMY    . BREAST LUMPECTOMY Left   . CHOLECYSTECTOMY    . JOINT REPLACEMENT     bilat knee   . REPLACEMENT TOTAL KNEE BILATERAL Bilateral   . TONSILLECTOMY       OB History   No obstetric history on file.     Family History  Problem Relation Age of Onset  . Cancer Mother        lung  . Arthritis Mother   . Heart disease Father        endocarditis  . Cancer Brother        lung, throat  . Alcohol abuse Brother     Social History   Tobacco Use  . Smoking status: Never Smoker  . Smokeless tobacco: Never Used  Substance Use Topics  . Alcohol use: No    Alcohol/week: 0.0 standard drinks  . Drug use: No    Home Medications Prior to Admission medications   Medication Sig Start Date End Date Taking? Authorizing Provider  apixaban (ELIQUIS) 5 MG TABS tablet Take 1 tablet (5 mg total) by mouth 2 (two) times daily. 07/27/18  Yes Rakes, Connye Burkitt,  FNP  Cholecalciferol (VITAMIN D3) 5000 units CAPS Take 2,000 Units by mouth daily.    Yes [provider]  D-Mannose 500 MG CAPS Take 500 mg by mouth daily.   Yes [provider]  glucose blood (ONE TOUCH ULTRA TEST) test strip USE TO check blood sugars twice daily 10/30/17  Yes Stacks, Cletus Gash, MD  hydrALAZINE (APRESOLINE) 25 MG tablet Take 1 tablet (25 mg total) by mouth 2 (two) times daily. 06/23/19 07/23/19 Yes Rakes, Connye Burkitt, FNP  hydrALAZINE (APRESOLINE) 50 MG tablet Take 1 tablet (50 mg total) by mouth 2 (two) times daily. 06/23/19 07/23/19 Yes Rakes, Connye Burkitt, FNP  Insulin Pen Needle 31G X 8 MM MISC Use once daily with lantus solostar 10/30/17  Yes Stacks, Cletus Gash, MD    LANTUS SOLOSTAR 100 UNIT/ML Solostar Pen INJECT 50 UNITS ONCE DAILY AT 10PM Patient taking differently: Inject 50 Units into the skin at bedtime.  06/18/19  Yes Rakes, Connye Burkitt, FNP  Magnesium 250 MG TABS Take 250 mg by mouth daily.   Yes [provider]  metFORMIN (GLUCOPHAGE) 500 MG tablet TAKE (1) TABLET DAILY WITH BREAKFAST. Patient taking differently: Take 500 mg by mouth daily with breakfast.  03/18/19  Yes Rakes, Connye Burkitt, FNP  nabumetone (RELAFEN) 500 MG tablet Take 1 tablet (500 mg total) by mouth 2 (two) times daily. For muscleand joint pain Patient taking differently: Take 500 mg by mouth 2 (two) times daily.  12/16/18  Yes Stacks, Cletus Gash, MD  nystatin cream (MYCOSTATIN) Apply 1 application topically 2 (two) times daily. 03/04/19  Yes Rakes, Connye Burkitt, FNP  omeprazole (PRILOSEC) 20 MG capsule TAKE  (1)  CAPSULE  TWICE DAILY (TAKE ON AN EMPTY STOMACH AT LEAST 30 MIN- UTES BEFORE MEALS). 05/23/19  Yes Rakes, Connye Burkitt, FNP  rOPINIRole (REQUIP) 0.25 MG tablet Take 1 tablet (0.25 mg total) by mouth 3 (three) times daily. Take one tablet in the morning, one tablet at noon and two tablets at bedtime. 06/23/19 07/23/19 Yes Rakes, Connye Burkitt, FNP  SUPER B COMPLEX/C PO Take 1 tablet by mouth daily.   Yes [provider]  DULoxetine (CYMBALTA) 20 MG capsule Take 1 capsule (20 mg total) by mouth daily. 05/25/19 06/24/19  Baruch Gouty, FNP  nitrofurantoin, macrocrystal-monohydrate, (MACROBID) 100 MG capsule  05/09/19   [provider]  ondansetron (ZOFRAN) 4 MG tablet TAKE 1 TABLET EVERY 8 HOURS AS NEEDED FOR NAUSEA & VOMITING 06/20/19   Rakes, Connye Burkitt, FNP  potassium chloride (KLOR-CON) 10 MEQ tablet Take 1 tablet (10 mEq total) by mouth daily. 06/20/19   Baruch Gouty, FNP  Insulin Glargine (LANTUS SOLOSTAR) 100 UNIT/ML Solostar Pen INJECT 50 UNITS ONCE DAILY AT 10PM 11/01/18   Baruch Gouty, FNP    Allergies    Diltiazem hcl and Ace inhibitors  Review of Systems   Review of  Systems  All other systems reviewed and are negative.   Physical Exam Updated Vital Signs BP (!) 184/74 (BP Location: Left Arm)   Pulse 72   Temp 98 F (36.7 C) (Oral)   Resp 18   Ht 4\' 10"  (1.473 m)   Wt 68 kg   SpO2 96%   BMI 31.35 kg/m   Physical Exam Vitals and nursing note reviewed.  Constitutional:      Appearance: She is well-developed.     Comments: Conversant, but confused  HENT:     Head: Normocephalic and atraumatic.  Eyes:     Conjunctiva/sclera: Conjunctivae normal.  Cardiovascular:     Rate and Rhythm: Normal rate and regular rhythm.  Pulmonary:     Effort: Pulmonary effort is normal.     Breath sounds: Normal breath sounds.  Abdominal:     General: Bowel sounds are normal.     Palpations: Abdomen is soft.  Musculoskeletal:        General: Normal range of motion.     Cervical back: Neck supple.  Skin:    General: Skin is warm and dry.  Neurological:     Mental Status: She is alert.     Comments: Moving arms and legs appropriately.  Psychiatric:     Comments: Confused per son's history.     ED Results / Procedures / Treatments   Labs (all labs ordered are listed, but only abnormal results are displayed) Labs Reviewed  CBC WITH DIFFERENTIAL/PLATELET - Abnormal; Notable for the following components:      Result Value   RBC 5.87 (*)    Hemoglobin 15.2 (*)    HCT 48.8 (*)    MCH 25.9 (*)    RDW 17.2 (*)    Lymphs Abs 0.3 (*)    All other components within normal limits  COMPREHENSIVE METABOLIC PANEL - Abnormal; Notable for the following components:   Potassium 3.2 (*)    Chloride 97 (*)    Glucose, Bld 165 (*)    GFR calc non Af Amer 52 (*)    All other components within normal limits  URINALYSIS, ROUTINE W REFLEX MICROSCOPIC - Abnormal; Notable for the following components:   APPearance HAZY (*)    Protein, ur 100 (*)    Bacteria, UA RARE (*)    All other components within normal limits  SARS CORONAVIRUS 2 (TAT 6-24 HRS)     EKG None  Radiology CT Head Wo Contrast  Result Date: 06/26/2019 CLINICAL DATA:  Confusion/altered mental status, shaking EXAM: CT HEAD WITHOUT CONTRAST TECHNIQUE: Contiguous axial images were obtained from the base of the skull through the vertex without intravenous contrast. COMPARISON:  11/03/2018 FINDINGS: Motion degraded images. Brain: No evidence of acute infarction, hemorrhage, hydrocephalus, extra-axial collection or mass lesion/mass effect. Global cortical atrophy.  Secondary ventricular prominence. Extensive subcortical white matter and periventricular small vessel ischemic changes. Vascular: Intracranial atherosclerosis. Skull: Normal. Negative for fracture or focal lesion. Sinuses/Orbits: Right frontal sinus is underpneumatized. Visualized paranasal sinuses and mastoid air cells are otherwise clear. Other: None. IMPRESSION: Motion degraded images. No evidence of acute intracranial abnormality. Atrophy with small vessel ischemic changes. Electronically Signed   By: Julian Hy M.D.   On: 06/26/2019 16:30   DG Chest Portable 1 View  Result Date: 06/26/2019 CLINICAL DATA:  Altered mental status EXAM: PORTABLE CHEST 1 VIEW COMPARISON:  03/04/2019 FINDINGS: Lungs are clear.  No pleural effusion or pneumothorax. Skin fold at the right lung apex. The heart is normal in size. Prominent epicardial fat along the left heart border. Thoracic aortic atherosclerosis. IMPRESSION: No evidence of acute cardiopulmonary disease. Thoracic aortic atherosclerosis. Electronically Signed   By: Julian Hy M.D.   On: 06/26/2019 16:23    Procedures Procedures (including critical care time)  Medications Ordered in ED Medications  potassium chloride SA (KLOR-CON) CR tablet 40 mEq (has no administration in time range)  sodium chloride 0.9 % bolus 500 mL (500 mLs Intravenous New Bag/Given 06/26/19 1557)    ED Course  I have reviewed the triage vital signs and the nursing notes.  Pertinent  labs & imaging results that were available  during my care of the patient were reviewed by me and considered in my medical decision making (see chart for details).    MDM Rules/Calculators/A&P                      We will initiate typical altered mental status work-up.  1750: CT head, chest x-ray, urinalysis showed no acute findings.  Potassium minimally suboptimal.  Son says her behavior is still abnormal.  Will admit to hospitalist service. Final Clinical Impression(s) / ED Diagnoses Final diagnoses:  Altered mental status, unspecified altered mental status type  Hypokalemia    Rx / DC Orders ED Discharge Orders    None       Nat Christen, MD 06/26/19 1610    Nat Christen, MD 06/26/19 [phone]

## 2019-06-26 NOTE — H&P (Signed)
TRH H&P   Patient Demographics:    [name], is a 86 y.o. female  MRN: [id number]   DOB - 1926-01-26  Admit Date - 06/26/2019  Outpatient Primary MD for the patient is Rakes, Connye Burkitt, FNP  Referring MD/NP/PA: DR Lacinda Axon  Patient coming from: Home  Chief Complaint  Patient presents with  . Altered Mental Status      HPI:    [name]  is a 86 y.o. female,  with medical history significant for type 2 diabetes, dyslipidemia, hypertension, GERD, and neuropathy, Patient lives at home by herself, is able to do some ADLs by herself, son at bedside provides most of the history, was even driving around 2 months ago, but she had rapid decline over the last few weeks, but she became more forgetful, less independent, and less ambulatory, as well she is having significant history of osteoarthritis which limits her mobility, patient mental status has been worsening over the last 18-month, but son reports she had progressive confusion, lethargy over the last week, but there is no focal deficits, no tingling, no numbness, no slurred speech, no facial droop, no stiff neck, patient with chronic osteoarthritis of multiple joints, she was given IM Medrol 40 mg 06/22/2019, and actually son can recall most of her complaints have worsened after that day. -In ED CT head with no acute findings, UA, chest x-ray with no evidence of infection, CT head with no acute findings as well.    Review of systems:    Patient unable to provide any review of systems   With Past History of the following :    Past Medical History:  Diagnosis Date  . Arthritis   . Cancer ([place])    breast  . Cataract   . Diabetes mellitus without complication ([place])   . Frequent UTI   . Hyperlipidemia   . Hypertension   . Neck pain   . Scoliosis   . Stroke [facility])       Past Surgical History:  Procedure Laterality Date  .  APPENDECTOMY    . BREAST LUMPECTOMY Left   . CHOLECYSTECTOMY    . JOINT REPLACEMENT     bilat knee   . REPLACEMENT TOTAL KNEE BILATERAL Bilateral   . TONSILLECTOMY        Social History:     Social History   Tobacco Use  . Smoking status: Never Smoker  . Smokeless tobacco: Never Used  Substance Use Topics  . Alcohol use: No    Alcohol/week: 0.0 standard drinks     Lives -at home by herself  Mobility -Per son she was not able to ambulate with a walker for few weeks now     Family History :     Family History  Problem Relation Age of Onset  . Cancer Mother        lung  . Arthritis Mother   .  Heart disease Father        endocarditis  . Cancer Brother        lung, throat  . Alcohol abuse Brother      Home Medications:   Prior to Admission medications   Medication Sig Start Date End Date Taking? Authorizing Provider  apixaban (ELIQUIS) 5 MG TABS tablet Take 1 tablet (5 mg total) by mouth 2 (two) times daily. 07/27/18  Yes Rakes, Connye Burkitt, FNP  Cholecalciferol (VITAMIN D3) 5000 units CAPS Take 2,000 Units by mouth daily.    Yes [provider]  D-Mannose 500 MG CAPS Take 500 mg by mouth daily.   Yes [provider]  glucose blood (ONE TOUCH ULTRA TEST) test strip USE TO check blood sugars twice daily 10/30/17  Yes Stacks, Cletus Gash, MD  hydrALAZINE (APRESOLINE) 25 MG tablet Take 1 tablet (25 mg total) by mouth 2 (two) times daily. 06/23/19 07/23/19 Yes Rakes, Connye Burkitt, FNP  hydrALAZINE (APRESOLINE) 50 MG tablet Take 1 tablet (50 mg total) by mouth 2 (two) times daily. 06/23/19 07/23/19 Yes Rakes, Connye Burkitt, FNP  Insulin Pen Needle 31G X 8 MM MISC Use once daily with lantus solostar 10/30/17  Yes Stacks, Cletus Gash, MD  LANTUS SOLOSTAR 100 UNIT/ML Solostar Pen INJECT 50 UNITS ONCE DAILY AT 10PM Patient taking differently: Inject 50 Units into the skin at bedtime.  06/18/19  Yes Rakes, Connye Burkitt, FNP  Magnesium 250 MG TABS Take 250 mg by mouth daily.   Yes [provider]  metFORMIN (GLUCOPHAGE) 500 MG tablet TAKE (1) TABLET DAILY WITH BREAKFAST. Patient taking differently: Take 500 mg by mouth daily with breakfast.  03/18/19  Yes Rakes, Connye Burkitt, FNP  nabumetone (RELAFEN) 500 MG tablet Take 1 tablet (500 mg total) by mouth 2 (two) times daily. For muscleand joint pain Patient taking differently: Take 500 mg by mouth 2 (two) times daily.  12/16/18  Yes Stacks, Cletus Gash, MD  nystatin cream (MYCOSTATIN) Apply 1 application topically 2 (two) times daily. 03/04/19  Yes Rakes, Connye Burkitt, FNP  omeprazole (PRILOSEC) 20 MG capsule TAKE  (1)  CAPSULE  TWICE DAILY (TAKE ON AN EMPTY STOMACH AT LEAST 30 MIN- UTES BEFORE MEALS). 05/23/19  Yes Rakes, Connye Burkitt, FNP  rOPINIRole (REQUIP) 0.25 MG tablet Take 1 tablet (0.25 mg total) by mouth 3 (three) times daily. Take one tablet in the morning, one tablet at noon and two tablets at bedtime. 06/23/19 07/23/19 Yes Rakes, Connye Burkitt, FNP  SUPER B COMPLEX/C PO Take 1 tablet by mouth daily.   Yes [provider]  DULoxetine (CYMBALTA) 20 MG capsule Take 1 capsule (20 mg total) by mouth daily. 05/25/19 06/24/19  Baruch Gouty, FNP  nitrofurantoin, macrocrystal-monohydrate, (MACROBID) 100 MG capsule  05/09/19   [provider]  ondansetron (ZOFRAN) 4 MG tablet TAKE 1 TABLET EVERY 8 HOURS AS NEEDED FOR NAUSEA & VOMITING 06/20/19   Rakes, Connye Burkitt, FNP  potassium chloride (KLOR-CON) 10 MEQ tablet Take 1 tablet (10 mEq total) by mouth daily. 06/20/19   Baruch Gouty, FNP  Insulin Glargine (LANTUS SOLOSTAR) 100 UNIT/ML Solostar Pen INJECT 50 UNITS ONCE DAILY AT 10PM 11/01/18   Baruch Gouty, FNP     Allergies:     Allergies  Allergen Reactions  . Diltiazem Hcl     Red itchy rash started a few hours after taking short acting 120mg  dilt tabs  . Ace Inhibitors Cough     Physical Exam:   Vitals  Blood pressure Marland Kitchen)  184/74, pulse 72, temperature 98 F (36.7 C), temperature source Oral, resp. rate 18, height 4\' 10"  (1.473  m), weight 68 kg, SpO2 96 %.   1. General frail elderly female, laying in bed, restless and mild amount of discomfort  2.  Patient is significantly confused, unable to answer any questions appropriately, does not follow commands as well .  3.  Patient significantly altered, unable to follow any commands, unable to perform appropriate physical exam, left upper exam is inconsistent .  4. Ears and Eyes appear Normal, Conjunctivae clear, PERRLA. Moist Oral Mucosa.  5. Supple Neck, No JVD, No cervical lymphadenopathy appriciated, No Carotid Bruits.  6. Symmetrical Chest wall movement, Good air movement bilaterally, CTAB.  7. IRR IRR, No Gallops, Rubs or Murmurs, No Parasternal Heave.  8. Positive Bowel Sounds, Abdomen Soft, No tenderness, No organomegaly appriciated,No rebound -guarding or rigidity.  9.  No Cyanosis, Normal Skin Turgor, No Skin Rash or Bruise.  10. Good muscle tone,  joints appear normal , no effusions, Normal ROM.  11. No Palpable Lymph Nodes in Neck or Axillae    Data Review:    CBC Recent Labs  Lab 06/22/19 1300 06/26/19 1556  WBC 7.1 8.9  HGB 14.5 15.2*  HCT 44.8 48.8*  PLT 249 229  MCV 82 83.1  MCH 26.6 25.9*  MCHC 32.4 31.1  RDW 15.7* 17.2*  LYMPHSABS 0.9 0.3*  MONOABS  --  0.8  EOSABS 0.3 0.1  BASOSABS 0.1 0.0   ------------------------------------------------------------------------------------------------------------------  Chemistries  Recent Labs  Lab 06/22/19 1300 06/26/19 1556  NA 143 138  K 4.1 3.2*  CL 99 97*  CO2 29 31  GLUCOSE 91 165*  BUN 21 18  CREATININE 1.00 0.95  CALCIUM 9.5 9.2  AST 24 24  ALT 18 22  ALKPHOS 69 55  BILITOT 0.5 0.8   ------------------------------------------------------------------------------------------------------------------ estimated creatinine clearance is 31.5 mL/min (by C-G formula based on SCr of 0.95  mg/dL). ------------------------------------------------------------------------------------------------------------------ No results for input(s): TSH, T4TOTAL, T3FREE, THYROIDAB in the last 72 hours.  Invalid input(s): FREET3  Coagulation profile No results for input(s): INR, PROTIME in the last 168 hours. ------------------------------------------------------------------------------------------------------------------- No results for input(s): DDIMER in the last 72 hours. -------------------------------------------------------------------------------------------------------------------  Cardiac Enzymes No results for input(s): CKMB, TROPONINI, MYOGLOBIN in the last 168 hours.  Invalid input(s): CK ------------------------------------------------------------------------------------------------------------------    Component Value Date/Time   BNP 192.2 (H) 01/29/2018 1447   BNP 206.0 (H) 12/06/2017 0926     ---------------------------------------------------------------------------------------------------------------  Urinalysis    Component Value Date/Time   COLORURINE YELLOW 06/26/2019 1547   APPEARANCEUR HAZY (A) 06/26/2019 1547   APPEARANCEUR Clear 03/04/2019 1702   LABSPEC 1.021 06/26/2019 1547   PHURINE 6.0 06/26/2019 1547   GLUCOSEU NEGATIVE 06/26/2019 1547   HGBUR NEGATIVE 06/26/2019 1547   BILIRUBINUR NEGATIVE 06/26/2019 1547   BILIRUBINUR Negative 03/04/2019 1702   KETONESUR NEGATIVE 06/26/2019 1547   PROTEINUR 100 (A) 06/26/2019 1547   UROBILINOGEN negative 07/03/2015 1351   UROBILINOGEN 1.0 05/14/2007 1320   NITRITE NEGATIVE 06/26/2019 1547   LEUKOCYTESUR NEGATIVE 06/26/2019 1547    ----------------------------------------------------------------------------------------------------------------   Imaging Results:    CT Head Wo Contrast  Result Date: 06/26/2019 CLINICAL DATA:  Confusion/altered mental status, shaking EXAM: CT HEAD WITHOUT CONTRAST  TECHNIQUE: Contiguous axial images were obtained from the base of the skull through the vertex without intravenous contrast. COMPARISON:  11/03/2018 FINDINGS: Motion degraded images. Brain: No evidence of acute infarction, hemorrhage, hydrocephalus, extra-axial collection or mass lesion/mass effect. Global cortical atrophy.  Secondary  ventricular prominence. Extensive subcortical white matter and periventricular small vessel ischemic changes. Vascular: Intracranial atherosclerosis. Skull: Normal. Negative for fracture or focal lesion. Sinuses/Orbits: Right frontal sinus is underpneumatized. Visualized paranasal sinuses and mastoid air cells are otherwise clear. Other: None. IMPRESSION: Motion degraded images. No evidence of acute intracranial abnormality. Atrophy with small vessel ischemic changes. Electronically Signed   By: Julian Hy M.D.   On: 06/26/2019 16:30   DG Chest Portable 1 View  Result Date: 06/26/2019 CLINICAL DATA:  Altered mental status EXAM: PORTABLE CHEST 1 VIEW COMPARISON:  03/04/2019 FINDINGS: Lungs are clear.  No pleural effusion or pneumothorax. Skin fold at the right lung apex. The heart is normal in size. Prominent epicardial fat along the left heart border. Thoracic aortic atherosclerosis. IMPRESSION: No evidence of acute cardiopulmonary disease. Thoracic aortic atherosclerosis. Electronically Signed   By: Julian Hy M.D.   On: 06/26/2019 16:23     Assessment & Plan:    Active Problems:   Hyperlipidemia associated with type 2 diabetes mellitus ([place])   Hypertension associated with diabetes ([place])   Diabetic polyneuropathy associated with type 2 diabetes mellitus ([facility])   Chronic diastolic CHF (congestive heart failure) ([facility])   Chronic pain of right knee   Chronic anticoagulation   Chronic left hip pain   Altered mental status   Altered mental status -Patient had progressive decline over last 2 months of mentation, but son noted sharp decline over last week, I  do think there is some steroid encephalopathy as she had acute changes after she received her IM Solu-Medrol shot. -Patient significantly altered, unable to perform appropriate logical exam, but there is questionable left upper arm weakness, so we will need to obtain MRI brain to rule out acute ischemic event. -Check TSH, 123456 and folic acid.  Chronic pain syndrome -Patient with history of severe osteoarthritis, and significant amount of discomfort and pain, he has not given any narcotics given she is very frail, unsteady, and on Eliquis as well high risk for fall . -Have discussed with son about goals of care, he appears interested in palliative care, as she appears to be very uncomfortable giving her severe osteoarthritis, and need better management of her pain control, and likely will need narcotics, and he might be interested on hospice.  Chronic diastolic heart failure  -He appears to be euvolemic, continue to monitor closely  Depression, recurrent -Continue with Cymbalta, which should assist with her chronic pain as well.  Diabetic polyneuropathy associated with type 2 diabetes mellitus ([place]) -Continue with Requip   Permanent atrial fibrillation  -Continue with Eliquis.  Type 2 diabetes mellitus with diabetic neuropathy, with long-term current use of insulin  -Will resume Lantus at a lower dose, will add insulin sliding scale, will hold Metformin  Hypertension  -Continue home medications  DVT Prophylaxis on Eliquis  AM Labs Ordered, also please review Full Orders  Family Communication: Admission, patients condition and plan of care including tests being ordered have been discussed with the Son who indicate understanding and agree with the plan and Code Status.  Code Status DNR  Likely DC to  Home  Condition GUARDED    Consults called: Palliative entered in EPIC  Admission status: Observation  Time spent in minutes : 55 minutes   Phillips Climes M.D on 06/26/2019 at  6:48 PM  Between 7am to 7pm - Pager - [phone]. After 7pm go to [email or web address] - password [facility]  [facility] - Office  [phone]

## 2019-06-26 NOTE — ED Triage Notes (Signed)
Pt's son states that she has been shaking and had confusion for the couple of days she has pain in her hips.

## 2019-06-26 NOTE — ED Notes (Signed)
Report given to [facility]. 

## 2019-06-26 NOTE — ED Notes (Signed)
Pt transported to [facility] room 25. When cleaning room after pt left- found pt's bifocal glasses. These glasses were labeled with pt's name and placed in a bag and given to another [facility] transport team to deliver to pt.

## 2019-06-27 ENCOUNTER — Observation Stay (HOSPITAL_BASED_OUTPATIENT_CLINIC_OR_DEPARTMENT_OTHER): Payer: PPO

## 2019-06-27 ENCOUNTER — Encounter (HOSPITAL_COMMUNITY): Payer: Self-pay | Admitting: Internal Medicine

## 2019-06-27 ENCOUNTER — Observation Stay (HOSPITAL_COMMUNITY): Payer: PPO

## 2019-06-27 DIAGNOSIS — G9341 Metabolic encephalopathy: Secondary | ICD-10-CM | POA: Diagnosis present

## 2019-06-27 DIAGNOSIS — R509 Fever, unspecified: Secondary | ICD-10-CM

## 2019-06-27 DIAGNOSIS — E1159 Type 2 diabetes mellitus with other circulatory complications: Secondary | ICD-10-CM

## 2019-06-27 DIAGNOSIS — Z66 Do not resuscitate: Secondary | ICD-10-CM | POA: Diagnosis present

## 2019-06-27 DIAGNOSIS — Z853 Personal history of malignant neoplasm of breast: Secondary | ICD-10-CM | POA: Diagnosis not present

## 2019-06-27 DIAGNOSIS — I5032 Chronic diastolic (congestive) heart failure: Secondary | ICD-10-CM

## 2019-06-27 DIAGNOSIS — Z7901 Long term (current) use of anticoagulants: Secondary | ICD-10-CM | POA: Diagnosis not present

## 2019-06-27 DIAGNOSIS — M25552 Pain in left hip: Secondary | ICD-10-CM | POA: Diagnosis not present

## 2019-06-27 DIAGNOSIS — I4821 Permanent atrial fibrillation: Secondary | ICD-10-CM

## 2019-06-27 DIAGNOSIS — I6389 Other cerebral infarction: Secondary | ICD-10-CM

## 2019-06-27 DIAGNOSIS — K219 Gastro-esophageal reflux disease without esophagitis: Secondary | ICD-10-CM | POA: Diagnosis present

## 2019-06-27 DIAGNOSIS — T6701XA Heatstroke and sunstroke, initial encounter: Secondary | ICD-10-CM | POA: Insufficient documentation

## 2019-06-27 DIAGNOSIS — E1151 Type 2 diabetes mellitus with diabetic peripheral angiopathy without gangrene: Secondary | ICD-10-CM | POA: Diagnosis present

## 2019-06-27 DIAGNOSIS — I10 Essential (primary) hypertension: Secondary | ICD-10-CM

## 2019-06-27 DIAGNOSIS — E876 Hypokalemia: Secondary | ICD-10-CM

## 2019-06-27 DIAGNOSIS — J8410 Pulmonary fibrosis, unspecified: Secondary | ICD-10-CM | POA: Diagnosis present

## 2019-06-27 DIAGNOSIS — I639 Cerebral infarction, unspecified: Secondary | ICD-10-CM | POA: Diagnosis present

## 2019-06-27 DIAGNOSIS — Z20822 Contact with and (suspected) exposure to covid-19: Secondary | ICD-10-CM | POA: Diagnosis present

## 2019-06-27 DIAGNOSIS — M25561 Pain in right knee: Secondary | ICD-10-CM | POA: Diagnosis not present

## 2019-06-27 DIAGNOSIS — Z8673 Personal history of transient ischemic attack (TIA), and cerebral infarction without residual deficits: Secondary | ICD-10-CM | POA: Diagnosis not present

## 2019-06-27 DIAGNOSIS — I63312 Cerebral infarction due to thrombosis of left middle cerebral artery: Secondary | ICD-10-CM | POA: Diagnosis not present

## 2019-06-27 DIAGNOSIS — Z7189 Other specified counseling: Secondary | ICD-10-CM

## 2019-06-27 DIAGNOSIS — G9340 Encephalopathy, unspecified: Secondary | ICD-10-CM

## 2019-06-27 DIAGNOSIS — R4182 Altered mental status, unspecified: Secondary | ICD-10-CM

## 2019-06-27 DIAGNOSIS — E785 Hyperlipidemia, unspecified: Secondary | ICD-10-CM | POA: Diagnosis present

## 2019-06-27 DIAGNOSIS — G8929 Other chronic pain: Secondary | ICD-10-CM

## 2019-06-27 DIAGNOSIS — M419 Scoliosis, unspecified: Secondary | ICD-10-CM | POA: Diagnosis present

## 2019-06-27 DIAGNOSIS — Z96653 Presence of artificial knee joint, bilateral: Secondary | ICD-10-CM | POA: Diagnosis present

## 2019-06-27 DIAGNOSIS — Z8261 Family history of arthritis: Secondary | ICD-10-CM | POA: Diagnosis not present

## 2019-06-27 DIAGNOSIS — E1169 Type 2 diabetes mellitus with other specified complication: Secondary | ICD-10-CM | POA: Diagnosis present

## 2019-06-27 DIAGNOSIS — M1990 Unspecified osteoarthritis, unspecified site: Secondary | ICD-10-CM | POA: Diagnosis present

## 2019-06-27 DIAGNOSIS — Z8249 Family history of ischemic heart disease and other diseases of the circulatory system: Secondary | ICD-10-CM | POA: Diagnosis not present

## 2019-06-27 DIAGNOSIS — I700 Atherosclerosis of aorta: Secondary | ICD-10-CM | POA: Diagnosis present

## 2019-06-27 DIAGNOSIS — E1142 Type 2 diabetes mellitus with diabetic polyneuropathy: Secondary | ICD-10-CM | POA: Diagnosis present

## 2019-06-27 DIAGNOSIS — M797 Fibromyalgia: Secondary | ICD-10-CM | POA: Diagnosis present

## 2019-06-27 DIAGNOSIS — F339 Major depressive disorder, recurrent, unspecified: Secondary | ICD-10-CM | POA: Diagnosis present

## 2019-06-27 DIAGNOSIS — I152 Hypertension secondary to endocrine disorders: Secondary | ICD-10-CM | POA: Diagnosis present

## 2019-06-27 DIAGNOSIS — M810 Age-related osteoporosis without current pathological fracture: Secondary | ICD-10-CM | POA: Diagnosis present

## 2019-06-27 DIAGNOSIS — I63233 Cerebral infarction due to unspecified occlusion or stenosis of bilateral carotid arteries: Secondary | ICD-10-CM | POA: Diagnosis not present

## 2019-06-27 DIAGNOSIS — Z515 Encounter for palliative care: Secondary | ICD-10-CM

## 2019-06-27 HISTORY — DX: Fever, unspecified: R50.9

## 2019-06-27 HISTORY — DX: Encephalopathy, unspecified: G93.40

## 2019-06-27 HISTORY — DX: Heatstroke and sunstroke, initial encounter: T67.01XA

## 2019-06-27 LAB — COMPREHENSIVE METABOLIC PANEL
ALT: 17 U/L (ref 0–44)
AST: 21 U/L (ref 15–41)
Albumin: 3.2 g/dL — ABNORMAL LOW (ref 3.5–5.0)
Alkaline Phosphatase: 46 U/L (ref 38–126)
Anion gap: 9 (ref 5–15)
BUN: 11 mg/dL (ref 8–23)
CO2: 25 mmol/L (ref 22–32)
Calcium: 8.5 mg/dL — ABNORMAL LOW (ref 8.9–10.3)
Chloride: 104 mmol/L (ref 98–111)
Creatinine, Ser: 0.71 mg/dL (ref 0.44–1.00)
GFR calc Af Amer: >60 mL/min (ref >60)
GFR calc non Af Amer: >60 mL/min (ref >60)
Glucose, Bld: 129 mg/dL — ABNORMAL HIGH (ref 70–99)
Potassium: 3.3 mmol/L — ABNORMAL LOW (ref 3.5–5.1)
Sodium: 138 mmol/L (ref 135–145)
Total Bilirubin: 0.7 mg/dL (ref 0.3–1.2)
Total Protein: 6.1 g/dL — ABNORMAL LOW (ref 6.5–8.1)

## 2019-06-27 LAB — SARS CORONAVIRUS 2 (TAT 6-24 HRS): SARS Coronavirus 2: NEGATIVE

## 2019-06-27 LAB — LIPID PANEL
Cholesterol: 240 mg/dL — ABNORMAL HIGH (ref 0–200)
HDL: 43 mg/dL (ref >40)
LDL Cholesterol: 179 mg/dL — ABNORMAL HIGH (ref 0–99)
Total CHOL/HDL Ratio: 5.6 RATIO
Triglycerides: 88 mg/dL (ref <150)
VLDL: 18 mg/dL (ref 0–40)

## 2019-06-27 LAB — GLUCOSE, CAPILLARY
Glucose-Capillary: 163 mg/dL — ABNORMAL HIGH (ref 70–99)
Glucose-Capillary: 113 mg/dL — ABNORMAL HIGH (ref 70–99)
Glucose-Capillary: 128 mg/dL — ABNORMAL HIGH (ref 70–99)

## 2019-06-27 LAB — MAGNESIUM: Magnesium: 1.6 mg/dL — ABNORMAL LOW (ref 1.7–2.4)

## 2019-06-27 LAB — ECHOCARDIOGRAM COMPLETE
Height: 58 in
Weight: 2448 oz

## 2019-06-27 LAB — HEMOGLOBIN A1C
Hgb A1c MFr Bld: 6.3 % — ABNORMAL HIGH (ref 4.8–5.6)
Mean Plasma Glucose: 134.11 mg/dL

## 2019-06-27 MED ORDER — HYDRALAZINE HCL 25 MG PO TABS: 25.0000 mg | ORAL_TABLET | Freq: Two times a day (BID) | ORAL | Status: DC

## 2019-06-27 MED ORDER — ATORVASTATIN CALCIUM 80 MG PO TABS
80.0000 mg | ORAL_TABLET | Freq: Every day | ORAL | Status: DC
  Administered 2019-06-28 – 2019-06-30 (×3): 80 mg via ORAL
  Filled 2019-06-27 (×3): qty 1

## 2019-06-27 MED ORDER — MAGNESIUM SULFATE 4 GM/100ML IV SOLN
4.0000 g | Freq: Once | INTRAVENOUS | Status: DC
  Filled 2019-06-27: qty 100

## 2019-06-27 MED ORDER — ACETAMINOPHEN 650 MG RE SUPP: 650.0000 mg | RECTAL | Status: DC | PRN

## 2019-06-27 MED ORDER — SODIUM CHLORIDE 0.9 % IV SOLN: INTRAVENOUS | Status: DC

## 2019-06-27 MED ORDER — ACETAMINOPHEN 325 MG PO TABS: 650.0000 mg | ORAL_TABLET | ORAL | Status: DC | PRN

## 2019-06-27 MED ORDER — HYDRALAZINE HCL 25 MG PO TABS
25.0000 mg | ORAL_TABLET | Freq: Three times a day (TID) | ORAL | Status: DC
  Administered 2019-06-28 – 2019-06-30 (×8): 25 mg via ORAL
  Filled 2019-06-27 (×8): qty 1

## 2019-06-27 MED ORDER — SENNOSIDES-DOCUSATE SODIUM 8.6-50 MG PO TABS
1.0000 | ORAL_TABLET | Freq: Every evening | ORAL | Status: DC | PRN
  Administered 2019-06-28: 1 via ORAL
  Filled 2019-06-27: qty 1

## 2019-06-27 MED ORDER — IOHEXOL 350 MG/ML SOLN
75.0000 mL | Freq: Once | INTRAVENOUS | Status: AC | PRN
  Administered 2019-06-27: 75 mL via INTRAVENOUS

## 2019-06-27 MED ORDER — STROKE: EARLY STAGES OF RECOVERY BOOK
Freq: Once | Status: AC
  Filled 2019-06-27 (×2): qty 1

## 2019-06-27 MED ORDER — ASPIRIN 300 MG RE SUPP
300.0000 mg | Freq: Once | RECTAL | Status: AC
  Administered 2019-06-27: 300 mg via RECTAL
  Filled 2019-06-27: qty 1

## 2019-06-27 MED ORDER — POTASSIUM CHLORIDE IN NACL 20-0.9 MEQ/L-% IV SOLN
INTRAVENOUS | Status: DC
  Filled 2019-06-27 (×2): qty 1000

## 2019-06-27 MED ORDER — ACETAMINOPHEN 160 MG/5ML PO SOLN: 650.0000 mg | ORAL | Status: DC | PRN

## 2019-06-27 NOTE — Progress Notes (Signed)
Progress Note    [name]  [id number] DOB: 12/04/1925  DOA: 06/26/2019 PCP: Baruch Gouty, FNP    Brief Narrative:   Chief complaint: acute encephalopathy  Medical records reviewed and are as summarized below:  [name] is an very pleasant 86 y.o. female with a past medical history significant for diabetes type 2, dyslipidemia, arthritis, hypertension, GERD, frequent UTIs, stroke presents to the emergency department February 21 with a chief complaint altered mental status.  She lives at home alone but son provides support and reports over the last 2 months she had a gradual decline in her ability to perform ADLs due to worsening arthritis and decreased mobility.  She was given IM Medrol on February 17 and since that time has demonstrated increased confusion.  He was brought to the emergency department.  Work-up included a CT of the head with no acute findings and an MRI positive for acute stroke.  Assessment/Plan:   Principal Problem:   CVA (cerebral vascular accident) ([place]) Active Problems:   Chronic diastolic CHF (congestive heart failure) (HCC)   Chronic anticoagulation   Acute encephalopathy   Hyperlipidemia associated with type 2 diabetes mellitus ([place])   Hypertension associated with diabetes (HCC)   Hypokalemia   Fever, unspecified   Diabetic polyneuropathy associated with type 2 diabetes mellitus (HCC)   Chronic pain of right knee   Chronic left hip pain  #1.  CVA.  Patient presented with acute encephalopathy and work-up included an MRI revealing acute subcentimeter infarction at the inferior basal ganglia on the left without mass-effect or hemorrhage and few millimeter size focus of acute infarction on the medial left thalamus.  History of stroke.  Home medications include Eliquis.  Will initiate stroke work-up -2D echo -Carotid Dopplers -Hemoglobin A1c and lipid panel -Frequent neuro checks -Hold antihypertensive meds for now -PT and OT -Speech  therapy -N.p.o. until she passes bedside swallow eval. of note patient was eating when I came in was having some coughing and clearing of the throat after a couple sips of coffee -asa. Will await lipid panel to start statin -neuro team notified  #2.  Acute encephalopathy.  Likely related to #1.  Much improved this morning.  Oriented to self and place.  Is able to follow simple commands.  CT and MRI as noted above.  She did receive a Medrol injection on 217 some concern for steroid encephalopathy.  She also had a fever of 101.8 last night.  Chest x-ray without infiltrate.  Urinalysis unremarkable.  No sign symptoms of infectious process.  TSH, folate, B12 all within the limits of normal.  No metabolic derangement. -See #1 -Gentle IV fluids  #3.  Hypertension.  Blood pressure slightly elevated.  Home medications include hydralazine. -Hold hydralazine for now allowing for permissive hypertension -Monitor  #4 fever.  Max temp 101.8 orally last night.  Urinalysis unremarkable.  Chest x-ray without infiltrate.  May be reactive to above. -Monitor  #5.  Diabetes.  Type II.  Serum glucose 165 on admission.  Home medications include Metformin and Lantus. -Continue Lantus -hOld metformin -Every 4 CBGs while n.p.o. -Sliding scale -Hemoglobin A1c  #6.  History of chronic diastolic heart failure.  Compensated.  No beta-blocker or diuretic and home medications. -No as noted above -Daily weights -Intake and output  #7.  Hypokalemia.  Mild -Replete -Recheck -Magnesium level  #8.  Chronic pain right knee left hip.  Appears stable at baseline. -PT and OT   Family Communication/Anticipated D/C  date and plan/Code Status   DVT prophylaxis: Lovenox ordered. Code Status: dnr Family Communication: son on phone Disposition Plan: to be determined   Medical Consultants:    neuro   Anti-Infectives:    None  Subjective:   Awake alert denies pain or discomfort.   Objective:    Vitals:    06/26/19 2130 06/26/19 2334 06/27/19 0438 06/27/19 0721  BP: (!) 174/63 (!) 158/68 (!) 179/77 (!) 183/96  Pulse: 81 82 70 70  Resp: 20 20 20 18   Temp: (!) 101.8 F (38.8 C) 98.2 F (36.8 C) 98.1 F (36.7 C) 98.8 F (37.1 C)  TempSrc: Oral Oral Oral Oral  SpO2: 96% 96% 95% 95%  Weight: 69.4 kg     Height: 4\' 10"  (1.473 m)       Intake/Output Summary (Last 24 hours) at 06/27/2019 0957 Last data filed at 06/27/2019 0900 Gross per 24 hour  Intake 835.86 ml  Output 860 ml  Net -24.14 ml   Filed Weights   06/26/19 1441 06/26/19 2130  Weight: 68 kg 69.4 kg    Exam: General: Awake alert no acute distress CV: Regular rate and rhythm no murmur gallop or rub no lower extremity edema Respiratory: No increased work of breathing breath sounds are clear bilaterally I hear no wheeze no rhonchi Abdomen nondistended soft positive bowel sounds throughout nontender to palpation no guarding or rebounding Musculoskeletal: Joints without swelling/erythema Neuro awake alert oriented to self and place speech clear facial symmetry tongue midline able to follow simple commands bilateral grip 5 out of 5 bilateral lower extremity strength 5 out of 5  Data Reviewed:   I have personally reviewed following labs and imaging studies:  Labs: Labs show the following:   Basic Metabolic Panel: Recent Labs  Lab 06/22/19 1300 06/22/19 1300 06/26/19 1556 06/27/19 0148  NA 143  --  138 138  K 4.1   < > 3.2* 3.3*  CL 99  --  97* 104  CO2 29  --  31 25  GLUCOSE 91  --  165* 129*  BUN 21  --  18 11  CREATININE 1.00  --  0.95 0.71  CALCIUM 9.5  --  9.2 8.5*   < > = values in this interval not displayed.   GFR Estimated Creatinine Clearance: 37.8 mL/min (by C-G formula based on SCr of 0.71 mg/dL). Liver Function Tests: Recent Labs  Lab 06/22/19 1300 06/26/19 1556 06/27/19 0148  AST 24 24 21   ALT 18 22 17   ALKPHOS 69 55 46  BILITOT 0.5 0.8 0.7  PROT 6.7 7.1 6.1*  ALBUMIN 4.2 3.9 3.2*   No  results for input(s): LIPASE, AMYLASE in the last 168 hours. No results for input(s): AMMONIA in the last 168 hours. Coagulation profile No results for input(s): INR, PROTIME in the last 168 hours.  CBC: Recent Labs  Lab 06/22/19 1300 06/26/19 1556  WBC 7.1 8.9  NEUTROABS 5.1 7.7  HGB 14.5 15.2*  HCT 44.8 48.8*  MCV 82 83.1  PLT 249 229   Cardiac Enzymes: No results for input(s): CKTOTAL, CKMB, CKMBINDEX, TROPONINI in the last 168 hours. BNP (last 3 results) No results for input(s): PROBNP in the last 8760 hours. CBG: Recent Labs  Lab 06/26/19 2322 06/27/19 0731  GLUCAP 163* 163*   D-Dimer: No results for input(s): DDIMER in the last 72 hours. Hgb A1c: No results for input(s): HGBA1C in the last 72 hours. Lipid Profile: No results for input(s): CHOL, HDL, LDLCALC, TRIG, CHOLHDL,  LDLDIRECT in the last 72 hours. Thyroid function studies: Recent Labs    06/26/19 1556  TSH 0.683   Anemia work up: Recent Labs    06/26/19 1556  VITAMINB12 808  FOLATE 19.7   Sepsis Labs: Recent Labs  Lab 06/22/19 1300 06/26/19 1556  WBC 7.1 8.9    Microbiology Recent Results (from the past 240 hour(s))  SARS CORONAVIRUS 2 (TAT 6-24 HRS) Nasopharyngeal Nasopharyngeal Swab     Status: None   Collection Time: 06/26/19  3:49 PM   Specimen: Nasopharyngeal Swab  Result Value Ref Range Status   SARS Coronavirus 2 NEGATIVE NEGATIVE Final    Comment: (NOTE) SARS-CoV-2 target nucleic acids are NOT DETECTED. The SARS-CoV-2 RNA is generally detectable in upper and lower respiratory specimens during the acute phase of infection. Negative results do not preclude SARS-CoV-2 infection, do not rule out co-infections with other pathogens, and should not be used as the sole basis for treatment or other patient management decisions. Negative results must be combined with clinical observations, patient history, and epidemiological information. The expected result is Negative. Fact Sheet  for Patients: SugarRoll.be Fact Sheet for Healthcare Providers: https://www.woods-mathews.com/ This test is not yet approved or cleared by the [place] FDA and  has been authorized for detection and/or diagnosis of SARS-CoV-2 by FDA under an Emergency Use Authorization (EUA). This EUA will remain  in effect (meaning this test can be used) for the duration of the COVID-19 declaration under Section 56 4(b)(1) of the Act, 21 U.S.C. section 360bbb-3(b)(1), unless the authorization is terminated or revoked sooner. Performed at [facility], [place] [address] [place], [place] [address]     Procedures and diagnostic studies:  CT Head Wo Contrast  Result Date: 06/26/2019 CLINICAL DATA:  Confusion/altered mental status, shaking EXAM: CT HEAD WITHOUT CONTRAST TECHNIQUE: Contiguous axial images were obtained from the base of the skull through the vertex without intravenous contrast. COMPARISON:  11/03/2018 FINDINGS: Motion degraded images. Brain: No evidence of acute infarction, hemorrhage, hydrocephalus, extra-axial collection or mass lesion/mass effect. Global cortical atrophy.  Secondary ventricular prominence. Extensive subcortical white matter and periventricular small vessel ischemic changes. Vascular: Intracranial atherosclerosis. Skull: Normal. Negative for fracture or focal lesion. Sinuses/Orbits: Right frontal sinus is underpneumatized. Visualized paranasal sinuses and mastoid air cells are otherwise clear. Other: None. IMPRESSION: Motion degraded images. No evidence of acute intracranial abnormality. Atrophy with small vessel ischemic changes. Electronically Signed   By: Julian Hy M.D.   On: 06/26/2019 16:30   MR BRAIN WO CONTRAST  Result Date: 06/26/2019 CLINICAL DATA:  Altered mental status.  Confusion. EXAM: MRI HEAD WITHOUT CONTRAST TECHNIQUE: Multiplanar, multiecho pulse sequences of the brain and surrounding structures were  obtained without intravenous contrast. COMPARISON:  Head CT same day.  MRI 05/28/2017. FINDINGS: Brain: The study does suffer from some motion degradation. There is a small focus of acute infarction at the inferior basal ganglia on the left. Very small focus of involvement of the medial left thalamus. No other acute infarction. Elsewhere, there chronic small-vessel ischemic changes affecting the pons. No focal cerebellar insult. Chronic small-vessel infarctions affect the thalami, and basal ganglia. Confluent chronic small vessel disease affects the cerebral hemispheric white matter. No mass lesion, hemorrhage, hydrocephalus or extra-axial collection. Vascular: Major vessels at the base of the brain show flow. Skull and upper cervical spine: Negative Sinuses/Orbits: Clear/normal Other: None IMPRESSION: Background pattern of extensive chronic small-vessel ischemic changes throughout the brain. Acute subcentimeter infarction at the inferior basal ganglia on the left  without mass effect or hemorrhage. Few mm size focus of acute infarction of the medial left thalamus. Electronically Signed   By: Nelson Chimes M.D.   On: 06/26/2019 23:09   DG Chest Portable 1 View  Result Date: 06/26/2019 CLINICAL DATA:  Altered mental status EXAM: PORTABLE CHEST 1 VIEW COMPARISON:  03/04/2019 FINDINGS: Lungs are clear.  No pleural effusion or pneumothorax. Skin fold at the right lung apex. The heart is normal in size. Prominent epicardial fat along the left heart border. Thoracic aortic atherosclerosis. IMPRESSION: No evidence of acute cardiopulmonary disease. Thoracic aortic atherosclerosis. Electronically Signed   By: Julian Hy M.D.   On: 06/26/2019 16:23    Medications:   .  stroke: mapping our early stages of recovery book   Does not apply Once  . apixaban  5 mg Oral BID  . aspirin  300 mg Rectal Once  . cholecalciferol  2,000 Units Oral Daily  . DULoxetine  20 mg Oral Daily  . insulin aspart  0-9 Units  Subcutaneous TID WC  . insulin glargine  30 Units Subcutaneous QHS  . pantoprazole  40 mg Oral Daily  . rOPINIRole  0.25 mg Oral TID   Continuous Infusions: . sodium chloride       LOS: 0 days   Radene Gunning NP [facility]   How to contact the [facility] Attending or Consulting provider [place] or covering provider during after hours [place], for this patient?  1. Check the care team in [facility] and look for a) attending/consulting [facility] provider listed and b) the [facility] team listed 2. Log into [email or web address] and use [place]'s universal password to access. If you do not have the password, please contact the hospital operator. 3. Locate the [facility] provider you are looking for under [facility] and page to a number that you can be directly reached. 4. If you still have difficulty reaching the provider, please page the [facility] (Director on Call) for the Hospitalists listed on amion for assistance.  06/27/2019, 9:57 AM

## 2019-06-27 NOTE — Progress Notes (Signed)
PT Cancellation Note  Patient Details Name: [name] MRN: [id number] DOB: [birth year]   Cancelled Treatment:    Reason Eval/Treat Not Completed: Other (comment)   Currently with Palliative Care Team NP;  Going for imaging soon as well;   Will follow up later today as time allows;  Otherwise, will follow up for PT tomorrow;   Thank you,  Roney Marion, PT  Acute Rehabilitation Services Pager [phone] Office [phone]     Colletta [place] 06/27/2019, 11:50 AM

## 2019-06-27 NOTE — Evaluation (Signed)
Clinical/Bedside Swallow Evaluation Patient Details  Name: [name] MRN: [id number] Date of Birth: February 03, 1926  Today's Date: 06/27/2019 Time: SLP Start Time (ACUTE ONLY): 1412 SLP Stop Time (ACUTE ONLY): 1422 SLP Time Calculation (min) (ACUTE ONLY): 10 min  Past Medical History:  Past Medical History:  Diagnosis Date  . Arthritis   . Atrial fibrillation ([place])   . Cancer ([place])    breast  . Cataract   . Diabetes mellitus without complication ([place])   . Frequent UTI   . Hyperlipidemia   . Hypertension   . Neck pain   . Scoliosis   . Stroke [facility])    Past Surgical History:  Past Surgical History:  Procedure Laterality Date  . APPENDECTOMY    . BREAST LUMPECTOMY Left   . CHOLECYSTECTOMY    . JOINT REPLACEMENT     bilat knee   . REPLACEMENT TOTAL KNEE BILATERAL Bilateral   . TONSILLECTOMY     HPI:  86 y.o. female, who lives independently with support from her son/dtr-in-law, and with PMH of hypertension, hyperlipidemia, diabetes and history of atrial fibrillation was admitted for confusion. MRI showed extensive chronic small vessel ischemic changes throughout the brain; acute subcentimeter infarct at the inferior basal ganglia; a few millimeter size foci of acute infarction of the medial left thalamus.  Per notes, son reports steady decline in mentation the last several months.    Assessment / Plan / Recommendation Clinical Impression  Pt presented with normal swallow function marked by adequate oral attention and manipulation, the appearance of a brisk swallow response, and no s/s of aspiration with successive boluses of thin liquid as well as mixed liquid/solid consistencies.  Recommend resuming a regular diet with thin liquids.  No SLP f/u for swallowing is warranted.  D/W her son.  SLP Visit Diagnosis: Dysphagia, unspecified (R13.10)    Aspiration Risk  No limitations    Diet Recommendation   regular diet, thin liquids  Medication Administration: Whole meds with  liquid    Other  Recommendations Oral Care Recommendations: Oral care BID   Follow up Recommendations None      Frequency and Duration            Prognosis        Swallow Study   General Date of Onset: 06/26/19 HPI: 86 y.o. female, who lives independently with support from her son/dtr-in-law, and with PMH of hypertension, hyperlipidemia, diabetes and history of atrial fibrillation was admitted for confusion. MRI showed extensive chronic small vessel ischemic changes throughout the brain; acute subcentimeter infarct at the inferior basal ganglia; a few millimeter size foci of acute infarction of the medial left thalamus.  Per notes, son reports steady decline in mentation the last several months.  Type of Study: Bedside Swallow Evaluation Previous Swallow Assessment: no Diet Prior to this Study: NPO Temperature Spikes Noted: No Respiratory Status: Room air History of Recent Intubation: No Behavior/Cognition: Alert;Cooperative;Pleasant mood Oral Cavity Assessment: Within Functional Limits Oral Care Completed by SLP: Yes Oral Cavity - Dentition: Adequate natural dentition Self-Feeding Abilities: Able to feed self Patient Positioning: Upright in bed Baseline Vocal Quality: Normal Volitional Cough: Strong Volitional Swallow: Able to elicit    Oral/Motor/Sensory Function Overall Oral Motor/Sensory Function: Within functional limits   Ice Chips Ice chips: Within functional limits   Thin Liquid Thin Liquid: Within functional limits    Nectar Thick Nectar Thick Liquid: Not tested   Honey Thick Honey Thick Liquid: Not tested   Puree Puree: Within functional limits  Solid     Solid: Within functional limits      [name] 06/27/2019,2:38 PM   Joan Wyly L. Tivis Ringer, [place] Office number [phone] Pager [phone]

## 2019-06-27 NOTE — Evaluation (Signed)
Speech Language Pathology Evaluation Patient Details Name: [name] MRN: [id number] DOB: 25-Apr-1926 Today's Date: 06/27/2019 Time: [id number] SLP Time Calculation (min) (ACUTE ONLY): 13 min  Problem List:  Patient Active Problem List   Diagnosis Date Noted  . Acute encephalopathy 06/27/2019  . Heat stroke 06/27/2019  . Hypokalemia 06/27/2019  . Fever, unspecified 06/27/2019  . Stroke ([place]) 06/27/2019  . Altered mental status 06/26/2019  . Right hip pain 06/22/2019  . Chronic left hip pain 05/25/2019  . Depression, recurrent ([place]) 05/25/2019  . Chronic anticoagulation 04/19/2019  . Leg cramps 03/04/2019  . Short-term memory loss 03/04/2019  . DOE (dyspnea on exertion) 03/04/2019  . Aortic atherosclerosis ([place]) 03/04/2019  . Physical deconditioning 01/24/2019  . History of recurrent UTIs 12/03/2018  . Chronic pain of right knee 10/08/2018  . Type 2 diabetes mellitus with diabetic neuropathy, unspecified ([place]) 08/12/2018  . Pulmonary vascular congestion   . Chronic diastolic CHF (congestive heart failure) ([place])   . Acute hypoxemic respiratory failure ([place]) 12/06/2017  . Permanent atrial fibrillation ([place]) 12/06/2017  . Trochanteric bursitis, right hip 07/16/2017  . Other intervertebral disc degeneration, lumbar region 07/16/2017  . Osteoporosis 11/04/2016  . Diabetic polyneuropathy associated with type 2 diabetes mellitus ([place]) 11/14/2014  . Fibromyalgia 02/11/2013  . CVA (cerebral vascular accident) ([place]) 08/01/2008  . Osteoarthritis 04/09/2007  . Hyperlipidemia associated with type 2 diabetes mellitus ([place]) 03/08/2007  . Hypertension associated with diabetes ([place]) 03/08/2007  . Pulmonary fibrosis ([place]) 03/08/2007  . GERD 03/08/2007  . BREAST CANCER, HX OF 03/08/2007   Past Medical History:  Past Medical History:  Diagnosis Date  . Arthritis   . Atrial fibrillation ([place])   . Cancer ([place])    breast  . Cataract   . Diabetes mellitus without complication ([place])    . Frequent UTI   . Hyperlipidemia   . Hypertension   . Neck pain   . Scoliosis   . Stroke [facility])    Past Surgical History:  Past Surgical History:  Procedure Laterality Date  . APPENDECTOMY    . BREAST LUMPECTOMY Left   . CHOLECYSTECTOMY    . JOINT REPLACEMENT     bilat knee   . REPLACEMENT TOTAL KNEE BILATERAL Bilateral   . TONSILLECTOMY     HPI:  86 y.o. female, who lives independently with support from her son/dtr-in-law, and with PMH of hypertension, hyperlipidemia, diabetes and history of atrial fibrillation was admitted for confusion. MRI showed extensive chronic small vessel ischemic changes throughout the brain; acute subcentimeter infarct at the inferior basal ganglia; a few millimeter size foci of acute infarction of the medial left thalamus.  Per notes, son reports steady decline in mentation the last several months.    Assessment / Plan / Recommendation Clinical Impression  Pt is delightful and interactive 86 year old with recent cognitive decline who presents with mild disorientation to elements of time and fluctuating deficits in both short and long-term recall (e.g., looking for son in room after acknowledging his presence, having difficulty recalling emergency phone number -[phone] but able to tap numbers accurately).  Pt demonstrated adequate attention to spell WORLD backwards and recalled list of 2/3 words after 30 seconds, 3/3 after two-minute delay.  Speech is clear and fluent; expressive and receptive language are intact.  Pt would benefit from further assessment of executive functions while she is in acute care given noted recent cognitive decline.  Pt/son agree. SLP will follow and make recs for disposition upon further assessment.  SLP Assessment  SLP Visit Diagnosis: Cognitive communication deficit (R41.841)    Follow Up Recommendations  Other (comment)(tba)    Frequency and Duration min 2x/week  2 weeks      SLP Evaluation Cognition  Overall Cognitive  Status: Impaired/Different from baseline Arousal/Alertness: Awake/alert Orientation Level: Oriented to person;Oriented to place;Oriented to situation;Disoriented to time Attention: Sustained Sustained Attention: Appears intact Memory: Impaired Memory Impairment: Retrieval deficit Awareness: Impaired Awareness Impairment: Emergent impairment Safety/Judgment: Impaired       Comprehension  Auditory Comprehension Overall Auditory Comprehension: Appears within functional limits for tasks assessed Reading Comprehension Reading Status: Not tested    Expression Expression Primary Mode of Expression: Verbal Verbal Expression Overall Verbal Expression: Appears within functional limits for tasks assessed Written Expression Dominant Hand: Right   Oral / Motor  Oral Motor/Sensory Function Overall Oral Motor/Sensory Function: Within functional limits Motor Speech Overall Motor Speech: Appears within functional limits for tasks assessed   GO                    Juan Quam Laurice 06/27/2019, 2:49 PM  Deaun Rocha L. Tivis Ringer, [place] Office number [phone]

## 2019-06-27 NOTE — Evaluation (Signed)
Physical Therapy Evaluation Patient Details Name: [name] MRN: [id number] DOB: Feb 23, 1926 Today's Date: 06/27/2019   History of Present Illness  86 y.o. female presenting with AMS with decrease function over last 2 months. Son reporting progressive confusion and lethargy over last week. MRI showing acute subcentimeter infarction at the inferior basal ganglia on the left. PMH including DM type 2, dyslipidemia, HTN, GERD, and neuropathy.   Clinical Impression   Pt admitted with above diagnosis. PTA, pt was living alone and was independent with her son and daughter-in-law living next door. Pt with progressive decline over last 2 month; Presents with decr balance, incr fall risk, dyscoordinated gait, cognitivie deficits; Very pleasant and participating well with therapies;  Pt currently with functional limitations due to the deficits listed below (see PT Problem List). Pt will benefit from skilled PT to increase their independence and safety with mobility to allow discharge to the venue listed below.       Follow Up Recommendations SNF    Equipment Recommendations  Rolling walker with 5" wheels;3in1 (PT)    Recommendations for Other Services       Precautions / Restrictions Precautions Precautions: Fall Restrictions Weight Bearing Restrictions: No      Mobility  Bed Mobility Overal bed mobility: Needs Assistance Bed Mobility: Supine to Sit     Supine to sit: Min assist     General bed mobility comments: Min A to pull into upright posture with holding therapist's hand  Transfers Overall transfer level: Needs assistance Equipment used: Rolling walker (2 wheeled) Transfers: Sit to/from Stand Sit to Stand: Min assist         General transfer comment: Min A for power up and to gain balance  Ambulation/Gait Ambulation/Gait assistance: Min assist Gait Distance (Feet): 50 Feet Assistive device: Rolling walker (2 wheeled) Gait Pattern/deviations: Decreased step  length - right;Decreased step length - left;Shuffle;Decreased dorsiflexion - right;Decreased dorsiflexion - left;Narrow base of support     General Gait Details: Short, uncoordinated steps with heavy dependence on RW for balance and support; Occasionally letting go of RW to reach out for furniture; Cues for pathfinding; a few losses of balance requiring min assist to regain footing  Stairs            Wheelchair Mobility    Modified [name] (Stroke Patients Only)       Balance Overall balance assessment: Needs assistance Sitting-balance support: No upper extremity supported;Feet supported Sitting balance-Leahy Scale: Fair     Standing balance support: Bilateral upper extremity supported;During functional activity Standing balance-Leahy Scale: Poor Standing balance comment: Reliant on UE support and physical A                             Pertinent Vitals/Pain Pain Assessment: No/denies pain    Home Living Family/patient expects to be discharged to:: Private residence Living Arrangements: Alone Available Help at Discharge: Family;Available PRN/intermittently Type of Home: House Home Access: Stairs to enter Entrance Stairs-Rails: Right Entrance Stairs-Number of Steps: 4 Home Layout: One level Home Equipment: Shower seat - built in;Walker - 4 wheels;Cane - single point;Bedside commode;Grab bars - tub/shower Additional Comments: Son lives in house next door.     Prior Function Level of Independence: Independent         Comments: ADLs, IADLs, driving around 2 months ago. Progressive decline in function.      Hand Dominance   Dominant Hand: Right    Extremity/Trunk Assessment   Upper  Extremity Assessment Upper Extremity Assessment: Defer to OT evaluation    Lower Extremity Assessment Lower Extremity Assessment: Generalized weakness    Cervical / Trunk Assessment Cervical / Trunk Assessment: Kyphotic  Communication   Communication: No  difficulties  Cognition Arousal/Alertness: Awake/alert Behavior During Therapy: WFL for tasks assessed/performed Overall Cognitive Status: Impaired/Different from baseline Area of Impairment: Attention;Following commands;Problem solving;Awareness                   Current Attention Level: Sustained   Following Commands: Follows one step commands with increased time;Follows multi-step commands inconsistently   Awareness: Intellectual Problem Solving: Slow processing;Difficulty sequencing;Requires verbal cues General Comments: Pt with decreased attention and becoming both externally and internally distracted. Requiring increased time throughout. Poor awareness of deficits. Able to say she is wobbly but decreased understanding of impacting on function or of poor cognition. Additionally, pt becoming slightly frustrated at end of session when OT/PT leaving.      General Comments General comments (skin integrity, edema, etc.): Son present throughout   06/27/19 1736  Modified [name] (Stroke Patients Only)  Pre-Morbid [name] Score 0  Modified [name] 4      Exercises     Assessment/Plan    PT Assessment Patient needs continued PT services  PT Problem List Decreased strength;Decreased range of motion;Decreased activity tolerance;Decreased balance;Decreased mobility;Decreased coordination;Decreased cognition;Decreased knowledge of use of DME;Decreased safety awareness;Decreased knowledge of precautions       PT Treatment Interventions DME instruction;Gait training;Stair training;Functional mobility training;Therapeutic activities;Therapeutic exercise;Balance training;Neuromuscular re-education;Cognitive remediation;Patient/family education    PT Goals (Current goals can be found in the Care Plan section)  Acute Rehab PT Goals Patient Stated Goal: "Return home" PT Goal Formulation: With patient Time For Goal Achievement: 07/11/19 Potential to Achieve Goals: Good    Frequency  Min 3X/week   Barriers to discharge        Co-evaluation PT/OT/SLP Co-Evaluation/Treatment: Yes Reason for Co-Treatment: For patient/therapist safety;To address functional/ADL transfers PT goals addressed during session: Mobility/safety with mobility OT goals addressed during session: ADL's and self-care       AM-PAC PT "6 Clicks" Mobility  Outcome Measure Help needed turning from your back to your side while in a flat bed without using bedrails?: A Little Help needed moving from lying on your back to sitting on the side of a flat bed without using bedrails?: A Little Help needed moving to and from a bed to a chair (including a wheelchair)?: A Lot Help needed standing up from a chair using your arms (e.g., wheelchair or bedside chair)?: A Little Help needed to walk in hospital room?: A Lot Help needed climbing 3-5 steps with a railing? : A Lot 6 Click Score: 15    End of Session Equipment Utilized During Treatment: Gait belt Activity Tolerance: Patient tolerated treatment well Patient left: in chair;with call bell/phone within reach;with chair alarm set Nurse Communication: Mobility status PT Visit Diagnosis: Unsteadiness on feet (R26.81);Other abnormalities of gait and mobility (R26.89);Other symptoms and signs involving the nervous system (R29.898)    Time: [id number] PT Time Calculation (min) (ACUTE ONLY): 28 min   Charges:   PT Evaluation $PT Eval Moderate Complexity: 1 Mod          Roney [place], [place]  Acute Rehabilitation Services Pager [phone] Office [phone]   [place] 06/27/2019, 5:44 PM

## 2019-06-27 NOTE — Progress Notes (Signed)
Pt's BP this morning is 179/77(104), notified Denney,FNP. Patient has been running high even before admission, Will hold off giving prn med for hypertension for now per on call MD.

## 2019-06-27 NOTE — Progress Notes (Signed)
Pt arrived in [facility] via [facility], alert, oriented to person only. Skin looks good except for right groin with MASD. Foam dressing placed on buttocks for prevention.  VS taken as follows: Temp: 101.8, BP: 174/63 (94), RR: 20, Spo2: 96 on RA, patient on yellow mews. Notified MD. Given tylenol for fever and scheduled hydralazine for hypertension. Pt's son came to see and check on pt for few minutes before patient going down to MRI. Patient high fall risk, placed on a low bed and floor mats are in place. Will continue to monitor pt with remainder of shift.

## 2019-06-27 NOTE — Consult Note (Signed)
Neurology Consultation  Reason for Consult: Stroke Referring Physician: Dr. Irine Seal  CC: Confusion  History is obtained from: Notes /son  HPI: PAMM WROBLEWSKI is a 86 y.o. female with pertinent past medical history of hypertension, hyperlipidemia, diabetes and and history of atrial fibrillation which she is on Eliquis.  Patient was admitted for confusion. MRI was obtained as part of her confusion work up. MRI showed a stroke thus neurology was consulted.    Per son who is in the room, patient has had a steady decline in mentation for the last couple months in which she has had progressive confusion and lack of memory.  Patient received a IM Solu-Medrol injection on 06/22/2019 for her osteoarthritis.  Over the last 2 weeks patient has become more forgetful, less independent, and less ambulatory along with significant history of osteoarthritis which limits her mobility.  As of yesterday she had a significant decline which included confusion.  Is for that reason that she was brought to the hospital.    Of note, son does believe she has underlying neurocognitive decline that has been undiagnosed  Chart review -patient has seen Dr. Tomi Likens for right arm and neck pain back in 04/20/2014  Work up that has been done: MRI brain- background pattern of extensive chronic small vessel ischemic changes throughout the brain.  Acute subcentimeter infarct at the inferior basal ganglia.  A few millimeter size foci of acute infarction of the medial left thalamus  CT head-motion degraded.  No evidence of acute intracranial abnormality.  Atrophy with small vessel ischemic changes Currently receiving echocardiogram  LKW: Unknown (probably few days ago) tpa given?: no, due to last seen normal unknown Premorbid modified [name] scale (mRS): 1 NIH stroke score-1 for missing age    Past Medical History:  Diagnosis Date  . Arthritis   . Cancer ([place])    breast  . Cataract   . Diabetes mellitus without  complication ([place])   . Frequent UTI   . Hyperlipidemia   . Hypertension   . Neck pain   . Scoliosis   . Stroke [facility])     Family History  Problem Relation Age of Onset  . Cancer Mother        lung  . Arthritis Mother   . Heart disease Father        endocarditis  . Cancer Brother        lung, throat  . Alcohol abuse Brother    Social History:   reports that she has never smoked. She has never used smokeless tobacco. She reports that she does not drink alcohol or use drugs.  Medications  Current Facility-Administered Medications:  .   stroke: mapping our early stages of recovery book, , Does not apply, Once, Black, Karen M, NP .  0.9 %  sodium chloride infusion, , Intravenous, Continuous, Black, Lezlie Octave, NP .  [DISCONTINUED] acetaminophen (TYLENOL) tablet 650 mg, 650 mg, Oral, Q4H PRN **OR** [DISCONTINUED] acetaminophen (TYLENOL) 160 MG/5ML solution 650 mg, 650 mg, Per Tube, Q4H PRN **OR** acetaminophen (TYLENOL) suppository 650 mg, 650 mg, Rectal, Q4H PRN, Black, Karen M, NP .  acetaminophen (TYLENOL) tablet 650 mg, 650 mg, Oral, Q6H PRN, 650 mg at 06/27/19 0854 **OR** [DISCONTINUED] acetaminophen (TYLENOL) suppository 650 mg, 650 mg, Rectal, Q6H PRN, Elgergawy, Silver Huguenin, MD .  apixaban (ELIQUIS) tablet 5 mg, 5 mg, Oral, BID, Elgergawy, Silver Huguenin, MD, 5 mg at 06/27/19 0845 .  aspirin suppository 300 mg, 300 mg, Rectal, Once, Black, Karen M,  NP .  cholecalciferol (VITAMIN D) tablet 2,000 Units, 2,000 Units, Oral, Daily, Elgergawy, Silver Huguenin, MD, 2,000 Units at 06/27/19 [phone] .  DULoxetine (CYMBALTA) DR capsule 20 mg, 20 mg, Oral, Daily, Elgergawy, Silver Huguenin, MD, 20 mg at 06/27/19 0845 .  ibuprofen (ADVIL) tablet 200 mg, 200 mg, Oral, Q6H PRN, Elgergawy, Silver Huguenin, MD, 200 mg at 06/27/19 0615 .  insulin aspart (novoLOG) injection 0-9 Units, 0-9 Units, Subcutaneous, TID WC, Elgergawy, Silver Huguenin, MD, 2 Units at 06/27/19 [phone] .  insulin glargine (LANTUS) injection 30 Units, 30 Units,  Subcutaneous, QHS, Elgergawy, Silver Huguenin, MD, 30 Units at 06/26/19 2323 .  pantoprazole (PROTONIX) EC tablet 40 mg, 40 mg, Oral, Daily, Elgergawy, Silver Huguenin, MD, 40 mg at 06/27/19 0845 .  rOPINIRole (REQUIP) tablet 0.25 mg, 0.25 mg, Oral, TID, Elgergawy, Silver Huguenin, MD, 0.25 mg at 06/27/19 0845 .  senna-docusate (Senokot-S) tablet 1 tablet, 1 tablet, Oral, QHS PRN, Black, Karen M, NP  ROS:  General ROS: negative for - chills, fatigue, fever, night sweats, weight gain or weight loss Psychological ROS: negative for - behavioral disorder, hallucinations, memory difficulties, mood swings or suicidal ideation Ophthalmic ROS: negative for - blurry vision, double vision, eye pain or loss of vision ENT ROS: negative for - epistaxis, nasal discharge, oral lesions, sore throat, tinnitus or vertigo Allergy and Immunology ROS: negative for - hives or itchy/watery eyes Hematological and Lymphatic ROS: negative for - bleeding problems, bruising or swollen lymph nodes Endocrine ROS: negative for - galactorrhea, hair pattern changes, polydipsia/polyuria or temperature intolerance Respiratory ROS: negative for - cough, hemoptysis, shortness of breath or wheezing Cardiovascular ROS: negative for - chest pain, dyspnea on exertion, edema or irregular heartbeat Gastrointestinal ROS: negative for - abdominal pain, diarrhea, hematemesis, nausea/vomiting or stool incontinence Genito-Urinary ROS: negative for - dysuria, hematuria, incontinence or urinary frequency/urgency Musculoskeletal ROS: Positive for - joint swelling and pain Neurological ROS: as noted in HPI Dermatological ROS: negative for rash and skin lesion changes  Exam: Current vital signs: BP (!) 183/96 (BP Location: Left Arm)   Pulse 70   Temp 98.8 F (37.1 C) (Oral)   Resp 18   Ht 4\' 10"  (1.473 m)   Wt 69.4 kg   SpO2 95%   BMI 31.98 kg/m  Vital signs in last 24 hours: Temp:  [98 F (36.7 C)-101.8 F (38.8 C)] 98.8 F (37.1 C) (02/22  0721) Pulse Rate:  [64-82] 70 (02/22 0721) Resp:  [16-29] 18 (02/22 0721) BP: (158-184)/(63-96) 183/96 (02/22 0721) SpO2:  [90 %-97 %] 95 % (02/22 0721) Weight:  [68 kg-69.4 kg] 69.4 kg (02/21 2130)   Constitutional: Appears well-developed and well-nourished.  Eyes: No scleral injection HENT: No OP obstrucion Head: Normocephalic.  Cardiovascular: Palpable radial pulse Respiratory: Effort normal, non-labored breathing GI: Soft.  No distension. There is no tenderness.  Skin: WDI  Neuro: Mental Status: Patient is awake, alert, believes she is at [facility], month, year, however missed her age Speech-intact naming, repeating, comprehension Cranial Nerves: II: Visual Fields are full.  III,IV, VI: EOMI without ptosis or diploplia. Pupils equal, round and reactive to light V: Facial sensation is symmetric to temperature VII: Facial movement is symmetric.  VIII: hearing is intact to voice X: Palat elevates symmetrically XI: Shoulder shrug is symmetric. XII: tongue is midline without atrophy or fasciculations.  Motor: Tone is normal. Bulk is normal. 5/5 strength was present in all four extremities.  No drift Positive essential tremor Sensory: Sensation is symmetric to light touch and temperature  in the arms and legs. No DSS Deep Tendon Reflexes: Difficult to obtain as patient was unable to relax Plantars: Toes are downgoing bilaterally.  Cerebellar: No ataxia or dysmetria noted  Labs I have reviewed labs in epic and the results pertinent to this consultation are:   CBC    Component Value Date/Time   WBC 8.9 06/26/2019 1556   RBC 5.87 (H) 06/26/2019 1556   HGB 15.2 (H) 06/26/2019 1556   HGB 14.5 06/22/2019 1300   HGB 13.2 03/10/2007 1344   HCT 48.8 (H) 06/26/2019 1556   HCT 44.8 06/22/2019 1300   HCT 37.0 03/10/2007 1344   PLT 229 06/26/2019 1556   PLT 249 06/22/2019 1300   MCV 83.1 06/26/2019 1556   MCV 82 06/22/2019 1300   MCV 85.8 03/10/2007 1344   MCH 25.9 (L)  06/26/2019 1556   MCHC 31.1 06/26/2019 1556   RDW 17.2 (H) 06/26/2019 1556   RDW 15.7 (H) 06/22/2019 1300   RDW 14.2 03/10/2007 1344   LYMPHSABS 0.3 (L) 06/26/2019 1556   LYMPHSABS 0.9 06/22/2019 1300   LYMPHSABS 1.3 03/10/2007 1344   MONOABS 0.8 06/26/2019 1556   MONOABS 0.5 03/10/2007 1344   EOSABS 0.1 06/26/2019 1556   EOSABS 0.3 06/22/2019 1300   BASOSABS 0.0 06/26/2019 1556   BASOSABS 0.1 06/22/2019 1300   BASOSABS 0.0 03/10/2007 1344    CMP     Component Value Date/Time   NA 138 06/27/2019 0148   NA 143 06/22/2019 1300   K 3.3 (L) 06/27/2019 0148   CL 104 06/27/2019 0148   CO2 25 06/27/2019 0148   GLUCOSE 129 (H) 06/27/2019 0148   BUN 11 06/27/2019 0148   BUN 21 06/22/2019 1300   CREATININE 0.71 06/27/2019 0148   CREATININE 0.77 12/06/2012 1609   CALCIUM 8.5 (L) 06/27/2019 0148   PROT 6.1 (L) 06/27/2019 0148   PROT 6.7 06/22/2019 1300   ALBUMIN 3.2 (L) 06/27/2019 0148   ALBUMIN 4.2 06/22/2019 1300   AST 21 06/27/2019 0148   ALT 17 06/27/2019 0148   ALKPHOS 46 06/27/2019 0148   BILITOT 0.7 06/27/2019 0148   BILITOT 0.5 06/22/2019 1300   GFRNONAA >60 06/27/2019 0148   GFRNONAA 71 12/06/2012 1609   GFRAA >60 06/27/2019 0148   GFRAA 82 12/06/2012 1609    Lipid Panel     Component Value Date/Time   CHOL 255 (H) 06/22/2019 1300   CHOL 176 11/09/2012 1604   TRIG 105 06/22/2019 1300   TRIG 189 (H) 07/14/2013 1525   TRIG 242 (H) 11/09/2012 1604   HDL 42 06/22/2019 1300   HDL 52 07/14/2013 1525   HDL 44 11/09/2012 1604   CHOLHDL 6.1 (H) 06/22/2019 1300   LDLCALC 194 (H) 06/22/2019 1300   LDLCALC 102 (H) 07/14/2013 1525   LDLCALC 84 11/09/2012 1604     Imaging I have reviewed the images obtained:  CT-scan of the brain-motion degraded images.  No evidence of acute intracranial abnormality  MRI examination of the brain-background pattern of extensive chronic small vessel ischemic changes throughout the brain.  Acute subcentimeter infarct at the inferior  basal ganglia on the left without mass-effect or hemorrhage.  Few millimeter sized foci of acute infarctions on the medial left thalamus  Etta Quill PA-C [facility] [phone]  M-F  (9:00 am- 5:00 PM)  06/27/2019, 10:47 AM     Assessment:  86 year old woman presenting for evaluation of confusion and an MRI done that shows a left thalamic infarct. Infarct likely secondary to small  vessel disease versus cardioembolic event. Less likely that the stroke can explain confusion but in a patient with underlying cognitive deficits, small changes can lead to more than expected changes including neurocognitive changes.  Needs stroke risk factor work-up.  Impression: Acute Ischemic Stroke  Recommend -CTA head and neck  -Continue on Eliquis  -Start or continue Atorvastatin 80 mg/other high intensity statin as her LDL is 124 -BP goal: No need for permissive hypertension as the last known normal was a few days and may be 1 week ago. -HBAIC  -Telemetry monitoring -Frequent neuro checks -NPO until passes stroke swallow screen -PT/OT # please page stroke NP  Or  PA  Or MD from 8am -4 pm  as this patient from this time will be  followed by the stroke.   You can look them up on [email or web address]  [place]  Attending Neurohospitalist Addendum Patient seen and examined with APP/Resident. Agree with the history and physical as documented above. Agree with the plan as documented, which I helped formulate. I have independently reviewed the chart, obtained history, review of systems and examined the patient.I have personally reviewed pertinent head/neck/spine imaging (CT/MRI). Please feel free to call with any questions. --- Amie [place], MD [facility] Pager: [phone]  If 7pm to 7am, please call on call as listed on AMION.

## 2019-06-27 NOTE — Evaluation (Signed)
Occupational Therapy Evaluation Patient Details Name: [name] MRN: [id number] DOB: 04/23/1926 Today's Date: 06/27/2019    History of Present Illness 86 y.o. female presenting with AMS with decrease function over last 2 months. Son reporting progressive confusion and lethargy over last week. MRI showing acute subcentimeter infarction at the inferior basal ganglia on the left. PMH including DM type 2, dyslipidemia, HTN, GERD, and neuropathy.    Clinical Impression   PTA, pt was living alone and was independent with her son and daughter-in-law living next door. Pt with progressive decline over last 2 month. Pt currently requiring Mod A for LB ADLs and Min-Mod A for functional mobility with RW. Pt presenting with decreased balance, cognition, strength, and safety. Very pleasant and agreeable to therapy. Pt would benefit from further acute OT to facilitate safe dc. Recommend dc to SNF for further OT to optimize safety, independence with ADLs, and return to PLOF.      Follow Up Recommendations  SNF;Supervision/Assistance - 24 hour    Equipment Recommendations  Other (comment)(Defer to next venue)    Recommendations for Other Services PT consult     Precautions / Restrictions Precautions Precautions: Fall Restrictions Weight Bearing Restrictions: No      Mobility Bed Mobility Overal bed mobility: Needs Assistance Bed Mobility: Supine to Sit     Supine to sit: Min assist     General bed mobility comments: Min A to pull into upright posture with holding therapist's hand  Transfers Overall transfer level: Needs assistance Equipment used: Rolling walker (2 wheeled) Transfers: Sit to/from Stand Sit to Stand: Min assist         General transfer comment: Min A for power up and to gain balance    Balance Overall balance assessment: Needs assistance Sitting-balance support: No upper extremity supported;Feet supported Sitting balance-Leahy Scale: Fair     Standing  balance support: Bilateral upper extremity supported;During functional activity Standing balance-Leahy Scale: Poor Standing balance comment: Reliant on UE support and physical A                           ADL either performed or assessed with clinical judgement   ADL Overall ADL's : Needs assistance/impaired Eating/Feeding: Set up;Sitting   Grooming: Set up;Brushing hair;Sitting;Supervision/safety   Upper Body Bathing: Min guard;Sitting   Lower Body Bathing: Moderate assistance;Sit to/from stand   Upper Body Dressing : Min guard;Sitting   Lower Body Dressing: Moderate assistance;Sit to/from stand   Toilet Transfer: Minimal assistance;Moderate assistance;Ambulation;RW(simulated to recliner) Toilet Transfer Details (indicate cue type and reason): Min A for sit<>Stand and power up. Mod A for LOB during mobility         Functional mobility during ADLs: Minimal assistance;Moderate assistance;Rolling walker General ADL Comments: Pt presenting with decreased strength, balance, cognition, and safety     Vision Baseline Vision/History: Wears glasses Wears Glasses: At all times Patient Visual Report: No change from baseline Additional Comments: Denies blurry or double vision. Will continue to assess.     Perception     Praxis      Pertinent Vitals/Pain Pain Assessment: No/denies pain     Hand Dominance Right   Extremity/Trunk Assessment Upper Extremity Assessment Upper Extremity Assessment: Generalized weakness   Lower Extremity Assessment Lower Extremity Assessment: Defer to PT evaluation   Cervical / Trunk Assessment Cervical / Trunk Assessment: Kyphotic   Communication Communication Communication: No difficulties   Cognition Arousal/Alertness: Awake/alert Behavior During Therapy: WFL for tasks assessed/performed Overall Cognitive  Status: Impaired/Different from baseline Area of Impairment: Attention;Following commands;Problem solving;Awareness                    Current Attention Level: Sustained   Following Commands: Follows one step commands with increased time;Follows multi-step commands inconsistently   Awareness: Intellectual Problem Solving: Slow processing;Difficulty sequencing;Requires verbal cues General Comments: Pt with decreased attention and becoming both externally and internally distracted. Requiring increased time throughout. Poor awareness of deficits. Able to say she is wobbly but decreased understanding of impacting on function or of poor cognition. Additionally, pt becoming slightly frustrated at end of session when OT/PT leaving.   General Comments  Son present throughout    Exercises     Shoulder Instructions      Home Living Family/patient expects to be discharged to:: Private residence Living Arrangements: Alone Available Help at Discharge: Family;Available PRN/intermittently Type of Home: House Home Access: Stairs to enter [employer] of Steps: 4 Entrance Stairs-Rails: Right Home Layout: One level     Bathroom Shower/Tub: Occupational psychologist: Handicapped height     Home Equipment: Shower seat - built in;Walker - 4 wheels;Cane - single point;Bedside commode;Grab bars - tub/shower   Additional Comments: Son lives in house next door.   Lives With: Alone    Prior Functioning/Environment Level of Independence: Independent        Comments: ADLs, IADLs, driving around 2 months ago. Progressive decline in function.         OT Problem List: Decreased strength;Decreased range of motion;Decreased activity tolerance;Impaired balance (sitting and/or standing);Decreased knowledge of use of DME or AE;Decreased knowledge of precautions;Decreased cognition;Decreased safety awareness      OT Treatment/Interventions: Self-care/ADL training;Therapeutic exercise;DME and/or AE instruction;Energy conservation;Therapeutic activities;Patient/family education    OT Goals(Current  goals can be found in the care plan section) Acute Rehab OT Goals Patient Stated Goal: "Return home" OT Goal Formulation: With patient/family Time For Goal Achievement: 07/11/19 Potential to Achieve Goals: Good  OT Frequency: Min 2X/week   Barriers to D/C:            Co-evaluation PT/OT/SLP Co-Evaluation/Treatment: Yes Reason for Co-Treatment: For patient/therapist safety;To address functional/ADL transfers   OT goals addressed during session: ADL's and self-care      AM-PAC OT "6 Clicks" Daily Activity     Outcome Measure Help from another person eating meals?: None Help from another person taking care of personal grooming?: None Help from another person toileting, which includes using toliet, bedpan, or urinal?: A Lot Help from another person bathing (including washing, rinsing, drying)?: A Lot Help from another person to put on and taking off regular upper body clothing?: A Little Help from another person to put on and taking off regular lower body clothing?: A Lot 6 Click Score: 17   End of Session Equipment Utilized During Treatment: Rolling walker;Gait belt Nurse Communication: Mobility status  Activity Tolerance: Patient tolerated treatment well Patient left: in chair;with call bell/phone within reach;with chair alarm set;with family/visitor present  OT Visit Diagnosis: Unsteadiness on feet (R26.81);Other abnormalities of gait and mobility (R26.89);Muscle weakness (generalized) (M62.81)                Time: [id number] OT Time Calculation (min): 29 min Charges:  OT General Charges $OT Visit: 1 Visit OT Evaluation $OT Eval Moderate Complexity: [place], OTR/L Acute Rehab Pager: [phone] Office: [place] 06/27/2019, 5:27 PM

## 2019-06-27 NOTE — Progress Notes (Signed)
  Echocardiogram 2D Echocardiogram has been performed.  [name] 06/27/2019, 11:15 AM

## 2019-06-28 DIAGNOSIS — E1169 Type 2 diabetes mellitus with other specified complication: Secondary | ICD-10-CM

## 2019-06-28 DIAGNOSIS — Z7901 Long term (current) use of anticoagulants: Secondary | ICD-10-CM

## 2019-06-28 DIAGNOSIS — E785 Hyperlipidemia, unspecified: Secondary | ICD-10-CM

## 2019-06-28 LAB — CBC WITH DIFFERENTIAL/PLATELET
Abs Immature Granulocytes: 0.05 10*3/uL (ref 0.00–0.07)
Basophils Absolute: 0 10*3/uL (ref 0.0–0.1)
Basophils Relative: 0 %
Eosinophils Absolute: 0.2 10*3/uL (ref 0.0–0.5)
Eosinophils Relative: 2 %
HCT: 46.6 % — ABNORMAL HIGH (ref 36.0–46.0)
Hemoglobin: 14.8 g/dL (ref 12.0–15.0)
Immature Granulocytes: 1 %
Lymphocytes Relative: 4 %
Lymphs Abs: 0.5 10*3/uL — ABNORMAL LOW (ref 0.7–4.0)
MCH: 25.6 pg — ABNORMAL LOW (ref 26.0–34.0)
MCHC: 31.8 g/dL (ref 30.0–36.0)
MCV: 80.5 fL (ref 80.0–100.0)
Monocytes Absolute: 1 10*3/uL (ref 0.1–1.0)
Monocytes Relative: 9 %
Neutro Abs: 9.1 10*3/uL — ABNORMAL HIGH (ref 1.7–7.7)
Neutrophils Relative %: 84 %
Platelets: 208 10*3/uL (ref 150–400)
RBC: 5.79 MIL/uL — ABNORMAL HIGH (ref 3.87–5.11)
RDW: 17.8 % — ABNORMAL HIGH (ref 11.5–15.5)
WBC: 10.8 10*3/uL — ABNORMAL HIGH (ref 4.0–10.5)
nRBC: 0 % (ref 0.0–0.2)

## 2019-06-28 LAB — GLUCOSE, CAPILLARY
Glucose-Capillary: 80 mg/dL (ref 70–99)
Glucose-Capillary: 88 mg/dL (ref 70–99)
Glucose-Capillary: 92 mg/dL (ref 70–99)
Glucose-Capillary: 115 mg/dL — ABNORMAL HIGH (ref 70–99)

## 2019-06-28 LAB — BASIC METABOLIC PANEL
Anion gap: 12 (ref 5–15)
BUN: 7 mg/dL — ABNORMAL LOW (ref 8–23)
CO2: 26 mmol/L (ref 22–32)
Calcium: 8.6 mg/dL — ABNORMAL LOW (ref 8.9–10.3)
Chloride: 100 mmol/L (ref 98–111)
Creatinine, Ser: 0.66 mg/dL (ref 0.44–1.00)
GFR calc Af Amer: >60 mL/min (ref >60)
GFR calc non Af Amer: >60 mL/min (ref >60)
Glucose, Bld: 85 mg/dL (ref 70–99)
Potassium: 2.8 mmol/L — ABNORMAL LOW (ref 3.5–5.1)
Sodium: 138 mmol/L (ref 135–145)

## 2019-06-28 LAB — MAGNESIUM: Magnesium: 1.9 mg/dL (ref 1.7–2.4)

## 2019-06-28 MED ORDER — INSULIN GLARGINE 100 UNIT/ML ~~LOC~~ SOLN
25.0000 [IU] | Freq: Every day | SUBCUTANEOUS | Status: DC
  Administered 2019-06-28 – 2019-06-30 (×3): 25 [IU] via SUBCUTANEOUS
  Filled 2019-06-28 (×4): qty 0.25

## 2019-06-28 MED ORDER — ASPIRIN EC 81 MG PO TBEC
81.0000 mg | DELAYED_RELEASE_TABLET | Freq: Every day | ORAL | Status: DC
  Administered 2019-06-28 – 2019-07-01 (×4): 81 mg via ORAL
  Filled 2019-06-28 (×4): qty 1

## 2019-06-28 MED ORDER — POTASSIUM CHLORIDE CRYS ER 20 MEQ PO TBCR
40.0000 meq | EXTENDED_RELEASE_TABLET | ORAL | Status: AC
  Administered 2019-06-28 (×2): 40 meq via ORAL
  Filled 2019-06-28 (×2): qty 2

## 2019-06-28 MED ORDER — MAGNESIUM SULFATE 2 GM/50ML IV SOLN
2.0000 g | Freq: Once | INTRAVENOUS | Status: AC
  Administered 2019-06-28: 2 g via INTRAVENOUS
  Filled 2019-06-28: qty 50

## 2019-06-28 NOTE — NC FL2 (Signed)
[place] MEDICAID FL2 LEVEL OF CARE SCREENING TOOL     IDENTIFICATION  Patient Name: [name] Birthdate: 05/12/1925 Sex: female Admission Date (Current Location): 06/26/2019  [facility] and [place] Number:  Herbalist and Address:  [facility], [place] [address] [place], [place] [address]      Provider Number: [id number]  Attending Physician Name and Address:  Eugenie Filler, MD  Relative Name and Phone Number:       Current Level of Care: Hospital Recommended Level of Care: [place] Prior Approval Number:    Date Approved/Denied:   PASRR Number: Manual review  Discharge Plan: SNF    Current Diagnoses: Patient Active Problem List   Diagnosis Date Noted  . Acute encephalopathy 06/27/2019  . Heat stroke 06/27/2019  . Hypokalemia 06/27/2019  . Fever, unspecified 06/27/2019  . Stroke ([place]) 06/27/2019  . Altered mental status 06/26/2019  . Right hip pain 06/22/2019  . Chronic left hip pain 05/25/2019  . Depression, recurrent ([place]) 05/25/2019  . Chronic anticoagulation 04/19/2019  . Leg cramps 03/04/2019  . Short-term memory loss 03/04/2019  . DOE (dyspnea on exertion) 03/04/2019  . Aortic atherosclerosis ([place]) 03/04/2019  . Physical deconditioning 01/24/2019  . History of recurrent UTIs 12/03/2018  . Chronic pain of right knee 10/08/2018  . Type 2 diabetes mellitus with diabetic neuropathy, unspecified ([place]) 08/12/2018  . Pulmonary vascular congestion   . Chronic diastolic CHF (congestive heart failure) ([place])   . Acute hypoxemic respiratory failure ([place]) 12/06/2017  . Permanent atrial fibrillation ([place]) 12/06/2017  . Trochanteric bursitis, right hip 07/16/2017  . Other intervertebral disc degeneration, lumbar region 07/16/2017  . Osteoporosis 11/04/2016  . Diabetic polyneuropathy associated with type 2 diabetes mellitus ([place]) 11/14/2014  . Fibromyalgia 02/11/2013  . CVA (cerebral vascular accident) ([place])  08/01/2008  . Osteoarthritis 04/09/2007  . Hyperlipidemia associated with type 2 diabetes mellitus ([place]) 03/08/2007  . Hypertension associated with diabetes ([place]) 03/08/2007  . Pulmonary fibrosis ([place]) 03/08/2007  . GERD 03/08/2007  . BREAST CANCER, HX OF 03/08/2007    Orientation RESPIRATION BLADDER Height & Weight     Self, Situation, Place  Normal Continent Weight: 153 lb (69.4 kg) Height:  4\' 10"  (147.3 cm)  BEHAVIORAL SYMPTOMS/MOOD NEUROLOGICAL BOWEL NUTRITION STATUS      Continent Diet(regular)  AMBULATORY STATUS COMMUNICATION OF NEEDS Skin   Limited Assist Verbally Normal                       Personal Care Assistance Level of Assistance  Bathing, Feeding, Dressing Bathing Assistance: Limited assistance Feeding assistance: Independent Dressing Assistance: Limited assistance     Functional Limitations Info             SPECIAL CARE FACTORS FREQUENCY  PT (By licensed PT), OT (By licensed OT), Speech therapy     PT Frequency: 5x/wk OT Frequency: 5x/wk     Speech Therapy Frequency: 5x/wk      Contractures Contractures Info: Not present    Additional Factors Info  Code Status, Allergies, Psychotropic, Insulin Sliding Scale Code Status Info: DNR Allergies Info: Diltiazem Hcl, Ace Inhibitors Psychotropic Info: Cymbalta 20mg  daily Insulin Sliding Scale Info: 0-9 units 3x/day with meals; Lantus 30 units at bed       Current Medications (06/28/2019):  This is the current hospital active medication list Current Facility-Administered Medications  Medication Dose Route Frequency Provider Last Rate Last Admin  . 0.9 % NaCl with KCl 20  mEq/ L  infusion   Intravenous Continuous Rosalin Hawking, MD 40 mL/hr at 06/28/19 1118 New Bag at 06/28/19 1118  . acetaminophen (TYLENOL) suppository 650 mg  650 mg Rectal Q4H PRN Radene Gunning, NP      . acetaminophen (TYLENOL) tablet 650 mg  650 mg Oral Q6H PRN Elgergawy, Silver Huguenin, MD   650 mg at 06/27/19 0854  . apixaban  (ELIQUIS) tablet 5 mg  5 mg Oral BID Elgergawy, Silver Huguenin, MD   5 mg at 06/28/19 0947  . atorvastatin (LIPITOR) tablet 80 mg  80 mg Oral q1800 Rosalin Hawking, MD      . cholecalciferol (VITAMIN D3) tablet 2,000 Units  2,000 Units Oral Daily Elgergawy, Silver Huguenin, MD   2,000 Units at 06/28/19 [phone]  . DULoxetine (CYMBALTA) DR capsule 20 mg  20 mg Oral Daily Elgergawy, Silver Huguenin, MD   20 mg at 06/28/19 0947  . hydrALAZINE (APRESOLINE) tablet 25 mg  25 mg Oral Q8H Rosalin Hawking, MD   25 mg at 06/28/19 0606  . ibuprofen (ADVIL) tablet 200 mg  200 mg Oral Q6H PRN Elgergawy, Silver Huguenin, MD   200 mg at 06/27/19 0615  . insulin aspart (novoLOG) injection 0-9 Units  0-9 Units Subcutaneous TID WC Elgergawy, Silver Huguenin, MD   1 Units at 06/27/19 1712  . insulin glargine (LANTUS) injection 30 Units  30 Units Subcutaneous QHS Elgergawy, Silver Huguenin, MD   30 Units at 06/27/19 2154  . magnesium sulfate IVPB 4 g 100 mL  4 g Intravenous Once Eugenie Filler, MD   Stopped at 06/27/19 1332  . pantoprazole (PROTONIX) EC tablet 40 mg  40 mg Oral Daily Elgergawy, Silver Huguenin, MD   40 mg at 06/28/19 0947  . potassium chloride SA (KLOR-CON) CR tablet 40 mEq  40 mEq Oral Q4H Radene Gunning, NP   40 mEq at 06/28/19 0948  . rOPINIRole (REQUIP) tablet 0.25 mg  0.25 mg Oral TID Elgergawy, Silver Huguenin, MD   0.25 mg at 06/28/19 0948  . senna-docusate (Senokot-S) tablet 1 tablet  1 tablet Oral QHS PRN Black, Lezlie Octave, NP         Discharge Medications: Please see discharge summary for a list of discharge medications.  Relevant Imaging Results:  Relevant Lab Results:   Additional Information SS#: [id number]  Geralynn Ochs, [name]

## 2019-06-28 NOTE — TOC Progression Note (Signed)
Transition of Care [facility]) - Progression Note    Patient Details  Name: ATIYA ILIFF MRN: [id number] Date of Birth: 1925-08-14  Transition of Care [facility]) CM/SW [place], Peter Work Phone Number: 06/28/2019, 4:20 PM  Clinical Narrative:     MSW Intern went to speak with pt about her concerns for rehab, per her dr. She was pleasant, but said she was very tired, and asked if she could talk later. MSW Intern spoke with pt's son who stated he did not have any preferences for SNF placement at this time. He stated that he will be in to see his mother around 5. MSW Intern went to speak with pt and she was awake and pleasant. Her concerns about rehab were addressed and she stated that she felt no one was listening to her. She pointed at the mitts in her room and said that she doesn't understand why they would use those on her. She did note that her son and his wife take great care of her and she knows she needs rehab. She attempted to get out of bed independently, stating that she did not need help. MSW Intern called nurse due to pt's fall risk bracelet, nurse responded before Sw left. SW will continue to follow.       Expected Discharge Plan and Services                                                 Social Determinants of Health (SDOH) Interventions    Readmission Risk Interventions No flowsheet data found.

## 2019-06-28 NOTE — Progress Notes (Signed)
Progress Note    [name]  [id number] DOB: [birth year]  DOA: 06/26/2019 PCP: Baruch Gouty, FNP    Brief Narrative:   Chief complaint: acute encephalopathy/stroke  Medical records reviewed and are as summarized below:  [name] is an 86 y.o. female the past medical history significant for diabetes type 2, arthritis, dyslipidemia, hypertension, GERD, frequent UTIs, stroke presented to the emergency department February 21 chief complaint altered mental status.  Patient noted by family to have been gradually declining over previous 2 months with inability to perform ADLs due to worsening arthritis and decreased mobility.  To the emergency department and work-up included an MRI which was positive for acute CVA.  Evaluated by neurology who opined infarct likely secondary to small vessel disease versus cardioembolic event.  Evaluated by physical therapy who recommend SNF.  Stroke work-up completed now awaiting placement  Assessment/Plan:   Principal Problem:   CVA (cerebral vascular accident) ([place]) Active Problems:   Chronic diastolic CHF (congestive heart failure) (H[facility])   Chronic anticoagulation   Acute encephalopathy   Hyperlipidemia associated with type 2 diabetes mellitus ([place])   Hypertension associated with diabetes ([place])   Hypokalemia   Fever, unspecified   Diabetic polyneuropathy associated with type 2 diabetes mellitus (H[facility])   Chronic pain of right knee   Chronic left hip pain   Stroke ([place])  #1.  Stroke.  Left basal ganglia and left thalamic infarcts embolic secondary to known atrial fibrillation on Eliquis.  MRI with extensive small vessel disease acute subcentimeter inferior left basal ganglia infarct with few left medial thalamus infarcts 2.  CTA of the head and neck next to be ICA bulb plaque without stenosis.  R VA origin 50% stenosis.  Severe left P3 stenosis, mild to moderate stenosis origin left MCA superior branch.  Right subarachnoid 40% stenosis.   Diffuse intracranial atherosclerosis.  2D echo with an EF of 60 to 65% no embolus seen, LDL 179, hemoglobin A1c 6.3.  Evaluated by physical therapy recommend SNF.  Evaluated by neurology who recommend Lipitor and continuation of Eliquis.  Awaiting neurology's final recommendation and SNF placement.  #2.  Acute metabolic encephalopathy.  Likely related to #1.  Much better than today.  Family has noted a gradual decline over the previous 2 months.  No signs of infectious process.  No metabolic derangement. -SNF when bed available  #3.  Hypertension.  Blood pressure high end of normal.  Home medications include hydralazine. -Hydralazine resumed last night -Monitor  #4.  Diabetes type 2.  Hemoglobin A1c 6.3.  Home medications include Metformin and Lantus. Serum glucose 85, CBGs 80 and 88. -We will reduce Lantus from 30units to 25 units -Continue to hold oral agents -Monitor  #5.  History of chronic diastolic heart failure.  Compensated.  #6.  Hypokalemia.  Potassium level 2.8 this morning in spite of supplements yesterday. -We will provide magnesium -K. Dur 40 mEq p.o. x2 -Recheck in the morning  #7.  Chronic right knee pain/left hip pain.  Stable at baseline  #8.  Permanent A. fib.  Home medications include Eliquis.  Rate controlled.    Family Communication/Anticipated D/C date and plan/Code Status   DVT prophylaxis: Lovenox ordered. Code Status: Full Code.  Family Communication: patient Disposition Plan: snf short term   Medical Consultants:    [facility]   Anti-Infectives:    None  Subjective:   Awake alert.  Just finished eating breakfast requesting assistance to the bathroom  Objective:  Vitals:   06/28/19 0018 06/28/19 0308 06/28/19 0823 06/28/19 1224  BP: (!) 166/80 (!) 161/87 138/79 (!) 155/81  Pulse: 66 70 75 88  Resp: 18 18 18 18   Temp: 98.3 F (36.8 C) 98.5 F (36.9 C) (!) 97.5 F (36.4 C) 97.6 F (36.4 C)  TempSrc: Oral Oral Oral   SpO2:  99% 99% 94% 90%  Weight:      Height:        Intake/Output Summary (Last 24 hours) at 06/28/2019 1344 Last data filed at 06/28/2019 0400 Gross per 24 hour  Intake 1028.34 ml  Output 1000 ml  Net 28.34 ml   Filed Weights   06/26/19 1441 06/26/19 2130  Weight: 68 kg 69.4 kg    Exam: General: Well-nourished awake alert no acute distress CV: Irregularly irregular no murmur gallop or rub no lower extremity edema Respiratory: No increased work of breathing breath sounds are clear bilaterally I hear no wheezes no rhonchi Abdomen: Nondistended soft positive bowel sounds throughout nontender to palpation no guarding or rebounding Musculoskeletal: Joints without swelling/erythema full range of motion Neuro: Awake alert oriented x3 speech clear facial symmetry cranial nerves II through XII grossly intact  Data Reviewed:   I have personally reviewed following labs and imaging studies:  Labs: Labs show the following:   Basic Metabolic Panel: Recent Labs  Lab 06/22/19 1300 06/22/19 1300 06/26/19 1556 06/26/19 1556 06/27/19 0148 06/28/19 0410  NA 143  --  138  --  138 138  K 4.1   < > 3.2*   < > 3.3* 2.8*  CL 99  --  97*  --  104 100  CO2 29  --  31  --  25 26  GLUCOSE 91  --  165*  --  129* 85  BUN 21  --  18  --  11 7*  CREATININE 1.00  --  0.95  --  0.71 0.66  CALCIUM 9.5  --  9.2  --  8.5* 8.6*  MG  --   --   --   --  1.6* 1.9   < > = values in this interval not displayed.   GFR Estimated Creatinine Clearance: 37.8 mL/min (by C-G formula based on SCr of 0.66 mg/dL). Liver Function Tests: Recent Labs  Lab 06/22/19 1300 06/26/19 1556 06/27/19 0148  AST 24 24 21   ALT 18 22 17   ALKPHOS 69 55 46  BILITOT 0.5 0.8 0.7  PROT 6.7 7.1 6.1*  ALBUMIN 4.2 3.9 3.2*   No results for input(s): LIPASE, AMYLASE in the last 168 hours. No results for input(s): AMMONIA in the last 168 hours. Coagulation profile No results for input(s): INR, PROTIME in the last 168  hours.  CBC: Recent Labs  Lab 06/22/19 1300 06/26/19 1556 06/28/19 0410  WBC 7.1 8.9 10.8*  NEUTROABS 5.1 7.7 9.1*  HGB 14.5 15.2* 14.8  HCT 44.8 48.8* 46.6*  MCV 82 83.1 80.5  PLT 249 229 208   Cardiac Enzymes: No results for input(s): CKTOTAL, CKMB, CKMBINDEX, TROPONINI in the last 168 hours. BNP (last 3 results) No results for input(s): PROBNP in the last 8760 hours. CBG: Recent Labs  Lab 06/27/19 0731 06/27/19 1235 06/27/19 1622 06/28/19 0617 06/28/19 0822  GLUCAP 163* 113* 128* 80 88   D-Dimer: No results for input(s): DDIMER in the last 72 hours. Hgb A1c: Recent Labs    06/27/19 0148  HGBA1C 6.3*   Lipid Profile: Recent Labs    06/27/19 0148  CHOL 240*  HDL 43  LDLCALC 179*  TRIG 88  CHOLHDL 5.6   Thyroid function studies: Recent Labs    06/26/19 1556  TSH 0.683   Anemia work up: Recent Labs    06/26/19 1556  VITAMINB12 808  FOLATE 19.7   Sepsis Labs: Recent Labs  Lab 06/22/19 1300 06/26/19 1556 06/28/19 0410  WBC 7.1 8.9 10.8*    Microbiology Recent Results (from the past 240 hour(s))  SARS CORONAVIRUS 2 (TAT 6-24 HRS) Nasopharyngeal Nasopharyngeal Swab     Status: None   Collection Time: 06/26/19  3:49 PM   Specimen: Nasopharyngeal Swab  Result Value Ref Range Status   SARS Coronavirus 2 NEGATIVE NEGATIVE Final    Comment: (NOTE) SARS-CoV-2 target nucleic acids are NOT DETECTED. The SARS-CoV-2 RNA is generally detectable in upper and lower respiratory specimens during the acute phase of infection. Negative results do not preclude SARS-CoV-2 infection, do not rule out co-infections with other pathogens, and should not be used as the sole basis for treatment or other patient management decisions. Negative results must be combined with clinical observations, patient history, and epidemiological information. The expected result is Negative. Fact Sheet for Patients: SugarRoll.be Fact Sheet for  Healthcare Providers: https://www.woods-mathews.com/ This test is not yet approved or cleared by the [place] FDA and  has been authorized for detection and/or diagnosis of SARS-CoV-2 by FDA under an Emergency Use Authorization (EUA). This EUA will remain  in effect (meaning this test can be used) for the duration of the COVID-19 declaration under Section 56 4(b)(1) of the Act, 21 U.S.C. section 360bbb-3(b)(1), unless the authorization is terminated or revoked sooner. Performed at [facility], [place] [address], [place], [place] [address]     Procedures and diagnostic studies:  CT ANGIO HEAD W OR WO CONTRAST  Result Date: 06/27/2019 CLINICAL DATA:  Stroke, follow up EXAM: CT ANGIOGRAPHY HEAD AND NECK TECHNIQUE: Multidetector CT imaging of the head and neck was performed using the standard protocol during bolus administration of intravenous contrast. Multiplanar CT image reconstructions and MIPs were obtained to evaluate the vascular anatomy. Carotid stenosis measurements (when applicable) are obtained utilizing NASCET criteria, using the distal internal carotid diameter as the denominator. CONTRAST:  75mL OMNIPAQUE IOHEXOL 350 MG/ML SOLN COMPARISON:  MRI of the brain June 26, 2019. FINDINGS: CT HEAD FINDINGS Brain: No evidence of new acute infarction, hemorrhage, hydrocephalus, extra-axial collection or mass lesion/mass effect. Small hypodensities in the left lentiform nucleus and thalamus corresponds to the recent infarct seen on prior MRI. Old lacunar infarcts are noted in the right basal ganglia and thalamus. Prominent confluent hypodensity of the white matter of the cerebral hemispheres is most likely related to chronic small vessel ischemia. Vascular: Tree is Skull: Normal. Negative for fracture or focal lesion. Sinuses: Imaged portions are clear. Orbits: Bilateral eye surgery. Review of the MIP images confirms the above findings CTA NECK FINDINGS Aortic arch: Common  origin of the innominate and left common carotid artery. Imaged portion shows no evidence of aneurysm or dissection. Calcified plaques are seen in the aortic arch and at the origin of the major neck arteries. No significant stenosis of the major arch vessel origins. Prominent tortuosity of the major neck arteries may be related to longstanding hypertension. Right carotid system: Mixed density plaque is noted in the right carotid bifurcation, without hemodynamically significant stenosis. Left carotid system: Mixed density plaque is noted in the left carotid bifurcation, without hemodynamically significant stenosis Vertebral arteries: Mixed density plaque is noted at the origin  of the right vertebral artery with approximately 50% stenosis. The left vertebral artery is dominant and its origin is widely patent. There is increased tortuosity and mild luminal irregularity of the cervical vertebral arteries without stenosis. Skeleton: Degenerative changes of the cervical spine are noted with prominent facet degeneration at C3-4 and C4-5. Other neck: 1.1 mm hypodense left thyroid lobe nodule. Upper chest: Negative Review of the MIP images confirms the above findings CTA HEAD FINDINGS Anterior circulation: Calcified plaques are seen in the bilateral carotid siphons extending to the supraclinoid segment on the right side where there is approximately 40% stenosis at the level of the terminus. No significant stenosis of the intracranial left ICA. There is early bifurcation of the left MCA with mild to moderate stenosis at the origin of the superior division branch. The A1 segment of the right ACA is hypoplastic with a dominant left A1/ACA predominantly supplying both A2 segments. Mild luminal irregularity is noted throughout the bilateral MCA and ACA vascular trees, suggestive of intracranial atherosclerotic disease. A prominent right posterior communicating artery seen. Posterior circulation: The left vertebral artery is  dominant. The basilar artery has normal course and caliber. The P1 segment of the right posterior cerebral artery is hypoplastic with most arterial flow coming from prominent posterior communicating artery (fetal PCA). Short segment of severe stenosis is noted at the P2 segment of the left posterior cerebral artery. There is also diffuse luminal irregularity throughout the bilateral PCA vascular trees, consistent with intracranial atherosclerotic disease. Venous sinuses: Minimally opacified given contrast timing. Anatomic variants: Right fetal PCA. Hypoplastic right A1/ACA with dominant left A1/ACA. Review of the MIP images confirms the above findings IMPRESSION: 1. Mixed density plaque in both carotid bulbs without hemodynamically significant stenosis. 2. A 50% stenosis at the origin of the right vertebral artery. 3. Short segment severe stenosis at the P2 segment of the left posterior cerebral artery. 4. Mild to moderate stenosis at the origin of the superior division left MCA branch. 5. A 40% stenosis of the supraclinoid right ICA. 6. Diffuse luminal irregularity throughout the bilateral MCA, ACA and PCA vascular trees, consistent with intracranial atherosclerotic disease. Aortic Atherosclerosis (ICD10-I70.0). Electronically Signed   By: Pedro Earls M.D.   On: 06/27/2019 13:12   CT Head Wo Contrast  Result Date: 06/26/2019 CLINICAL DATA:  Confusion/altered mental status, shaking EXAM: CT HEAD WITHOUT CONTRAST TECHNIQUE: Contiguous axial images were obtained from the base of the skull through the vertex without intravenous contrast. COMPARISON:  11/03/2018 FINDINGS: Motion degraded images. Brain: No evidence of acute infarction, hemorrhage, hydrocephalus, extra-axial collection or mass lesion/mass effect. Global cortical atrophy.  Secondary ventricular prominence. Extensive subcortical white matter and periventricular small vessel ischemic changes. Vascular: Intracranial atherosclerosis.  Skull: Normal. Negative for fracture or focal lesion. Sinuses/Orbits: Right frontal sinus is underpneumatized. Visualized paranasal sinuses and mastoid air cells are otherwise clear. Other: None. IMPRESSION: Motion degraded images. No evidence of acute intracranial abnormality. Atrophy with small vessel ischemic changes. Electronically Signed   By: Julian Hy M.D.   On: 06/26/2019 16:30   CT ANGIO NECK W OR WO CONTRAST  Result Date: 06/27/2019 CLINICAL DATA:  Stroke, follow up EXAM: CT ANGIOGRAPHY HEAD AND NECK TECHNIQUE: Multidetector CT imaging of the head and neck was performed using the standard protocol during bolus administration of intravenous contrast. Multiplanar CT image reconstructions and MIPs were obtained to evaluate the vascular anatomy. Carotid stenosis measurements (when applicable) are obtained utilizing NASCET criteria, using the distal internal carotid diameter as the  denominator. CONTRAST:  64mL OMNIPAQUE IOHEXOL 350 MG/ML SOLN COMPARISON:  MRI of the brain June 26, 2019. FINDINGS: CT HEAD FINDINGS Brain: No evidence of new acute infarction, hemorrhage, hydrocephalus, extra-axial collection or mass lesion/mass effect. Small hypodensities in the left lentiform nucleus and thalamus corresponds to the recent infarct seen on prior MRI. Old lacunar infarcts are noted in the right basal ganglia and thalamus. Prominent confluent hypodensity of the white matter of the cerebral hemispheres is most likely related to chronic small vessel ischemia. Vascular: Tree is Skull: Normal. Negative for fracture or focal lesion. Sinuses: Imaged portions are clear. Orbits: Bilateral eye surgery. Review of the MIP images confirms the above findings CTA NECK FINDINGS Aortic arch: Common origin of the innominate and left common carotid artery. Imaged portion shows no evidence of aneurysm or dissection. Calcified plaques are seen in the aortic arch and at the origin of the major neck arteries. No  significant stenosis of the major arch vessel origins. Prominent tortuosity of the major neck arteries may be related to longstanding hypertension. Right carotid system: Mixed density plaque is noted in the right carotid bifurcation, without hemodynamically significant stenosis. Left carotid system: Mixed density plaque is noted in the left carotid bifurcation, without hemodynamically significant stenosis Vertebral arteries: Mixed density plaque is noted at the origin of the right vertebral artery with approximately 50% stenosis. The left vertebral artery is dominant and its origin is widely patent. There is increased tortuosity and mild luminal irregularity of the cervical vertebral arteries without stenosis. Skeleton: Degenerative changes of the cervical spine are noted with prominent facet degeneration at C3-4 and C4-5. Other neck: 1.1 mm hypodense left thyroid lobe nodule. Upper chest: Negative Review of the MIP images confirms the above findings CTA HEAD FINDINGS Anterior circulation: Calcified plaques are seen in the bilateral carotid siphons extending to the supraclinoid segment on the right side where there is approximately 40% stenosis at the level of the terminus. No significant stenosis of the intracranial left ICA. There is early bifurcation of the left MCA with mild to moderate stenosis at the origin of the superior division branch. The A1 segment of the right ACA is hypoplastic with a dominant left A1/ACA predominantly supplying both A2 segments. Mild luminal irregularity is noted throughout the bilateral MCA and ACA vascular trees, suggestive of intracranial atherosclerotic disease. A prominent right posterior communicating artery seen. Posterior circulation: The left vertebral artery is dominant. The basilar artery has normal course and caliber. The P1 segment of the right posterior cerebral artery is hypoplastic with most arterial flow coming from prominent posterior communicating artery (fetal  PCA). Short segment of severe stenosis is noted at the P2 segment of the left posterior cerebral artery. There is also diffuse luminal irregularity throughout the bilateral PCA vascular trees, consistent with intracranial atherosclerotic disease. Venous sinuses: Minimally opacified given contrast timing. Anatomic variants: Right fetal PCA. Hypoplastic right A1/ACA with dominant left A1/ACA. Review of the MIP images confirms the above findings IMPRESSION: 1. Mixed density plaque in both carotid bulbs without hemodynamically significant stenosis. 2. A 50% stenosis at the origin of the right vertebral artery. 3. Short segment severe stenosis at the P2 segment of the left posterior cerebral artery. 4. Mild to moderate stenosis at the origin of the superior division left MCA branch. 5. A 40% stenosis of the supraclinoid right ICA. 6. Diffuse luminal irregularity throughout the bilateral MCA, ACA and PCA vascular trees, consistent with intracranial atherosclerotic disease. Aortic Atherosclerosis (ICD10-I70.0). Electronically Signed   By: Erven Colla  Karenann Cai M.D.   On: 06/27/2019 13:12   MR BRAIN WO CONTRAST  Result Date: 06/26/2019 CLINICAL DATA:  Altered mental status.  Confusion. EXAM: MRI HEAD WITHOUT CONTRAST TECHNIQUE: Multiplanar, multiecho pulse sequences of the brain and surrounding structures were obtained without intravenous contrast. COMPARISON:  Head CT same day.  MRI 05/28/2017. FINDINGS: Brain: The study does suffer from some motion degradation. There is a small focus of acute infarction at the inferior basal ganglia on the left. Very small focus of involvement of the medial left thalamus. No other acute infarction. Elsewhere, there chronic small-vessel ischemic changes affecting the pons. No focal cerebellar insult. Chronic small-vessel infarctions affect the thalami, and basal ganglia. Confluent chronic small vessel disease affects the cerebral hemispheric white matter. No mass lesion,  hemorrhage, hydrocephalus or extra-axial collection. Vascular: Major vessels at the base of the brain show flow. Skull and upper cervical spine: Negative Sinuses/Orbits: Clear/normal Other: None IMPRESSION: Background pattern of extensive chronic small-vessel ischemic changes throughout the brain. Acute subcentimeter infarction at the inferior basal ganglia on the left without mass effect or hemorrhage. Few mm size focus of acute infarction of the medial left thalamus. Electronically Signed   By: Nelson Chimes M.D.   On: 06/26/2019 23:09   DG Chest Portable 1 View  Result Date: 06/26/2019 CLINICAL DATA:  Altered mental status EXAM: PORTABLE CHEST 1 VIEW COMPARISON:  03/04/2019 FINDINGS: Lungs are clear.  No pleural effusion or pneumothorax. Skin fold at the right lung apex. The heart is normal in size. Prominent epicardial fat along the left heart border. Thoracic aortic atherosclerosis. IMPRESSION: No evidence of acute cardiopulmonary disease. Thoracic aortic atherosclerosis. Electronically Signed   By: Julian Hy M.D.   On: 06/26/2019 16:23   ECHOCARDIOGRAM COMPLETE  Result Date: 06/27/2019    ECHOCARDIOGRAM REPORT   Patient Name:   Marise A Horne Date of Exam: 06/27/2019 Medical Rec #:  [id number]        Height:       58.0 in Accession #:    [id number]       Weight:       153.0 lb Date of Birth:  28-Jun-1925        BSA:          1.625 m Patient Age:    3 years         BP:           183/96 mmHg Patient Gender: F                HR:           75 bpm. Exam Location:  Inpatient Procedure: 2D Echo, Cardiac Doppler and Color Doppler Indications:    Stroke 434.91  History:        Patient has prior history of Echocardiogram examinations, most                 recent 12/07/2017. CHF, Stroke; Risk Factors:Hypertension,                 Diabetes, Dyslipidemia and Non-Smoker. DOE. GERD.  Sonographer:    Vickie Epley RDCS Referring Phys: [place]  1. Left ventricular ejection fraction, by  estimation, is 60 to 65%. The left ventricle has normal function. The left ventricle has no regional wall motion abnormalities.LV diastolic filling could not be assessed due to underlying atrial fibrillation.  2. Right ventricular systolic function is normal. The right ventricular size is normal.  3. The mitral valve  is degenerative. No evidence of mitral valve regurgitation. No evidence of mitral stenosis.  4. The aortic valve is tricuspid. Aortic valve regurgitation is not visualized. Mild to moderate aortic valve sclerosis/calcification is present, without any evidence of aortic stenosis.  5. The inferior vena cava is normal in size with greater than 50% respiratory variability, suggesting right atrial pressure of 3 mmHg.  6. Left atrial size was mild to moderately dilated. FINDINGS  Left Ventricle: Left ventricular ejection fraction, by estimation, is 60 to 65%. The left ventricle has normal function. The left ventricle has no regional wall motion abnormalities. The left ventricular internal cavity size was normal in size. There is  no left ventricular hypertrophy. LC diastolic filling could not be assessed due to underlying atrial fibrillation. Elevated left ventricular end-diastolic pressure. Right Ventricle: The right ventricular size is normal. No increase in right ventricular wall thickness. Right ventricular systolic function is normal. Left Atrium: Left atrial size was mild to moderately dilated. Right Atrium: Right atrial size was normal in size. Pericardium: There is no evidence of pericardial effusion. Mitral Valve: The mitral valve is degenerative in appearance. There is mild calcification of the anterior mitral valve leaflet(s). Normal mobility of the mitral valve leaflets. No evidence of mitral valve regurgitation. No evidence of mitral valve stenosis. Tricuspid Valve: The tricuspid valve is normal in structure. Tricuspid valve regurgitation is trivial. No evidence of tricuspid stenosis. Aortic  Valve: The aortic valve is tricuspid. . There is moderate thickening and mild calcification of the aortic valve. Aortic valve regurgitation is not visualized. Mild to moderate aortic valve sclerosis/calcification is present, without any evidence of aortic stenosis. There is moderate thickening of the aortic valve. There is mild calcification of the aortic valve. Pulmonic Valve: The pulmonic valve was normal in structure. Pulmonic valve regurgitation is not visualized. No evidence of pulmonic stenosis. Aorta: The aortic root is normal in size and structure. Venous: The inferior vena cava is normal in size with greater than 50% respiratory variability, suggesting right atrial pressure of 3 mmHg. IAS/Shunts: No atrial level shunt detected by color flow Doppler.  LEFT VENTRICLE PLAX 2D LVIDd:         5.20 cm  Diastology LVIDs:         3.40 cm  LV e' lateral:   8.84 cm/s LV PW:         0.90 cm  LV E/e' lateral: 14.6 LV IVS:        0.90 cm  LV e' medial:    6.08 cm/s LVOT diam:     1.70 cm  LV E/e' medial:  21.2 LV SV:         55.16 ml LV SV Index:   33.94 LVOT Area:     2.27 cm  RIGHT VENTRICLE RV S prime:     12.30 cm/s TAPSE (M-mode): 1.5 cm LEFT ATRIUM             Index LA diam:        5.20 cm 3.20 cm/m LA Vol (A2C):   58.2 ml 35.81 ml/m LA Vol (A4C):   53.3 ml 32.80 ml/m LA Biplane Vol: 56.4 ml 34.70 ml/m  AORTIC VALVE LVOT Vmax:   104.00 cm/s LVOT Vmean:  80.000 cm/s LVOT VTI:    0.243 m  AORTA Ao Root diam: 3.00 cm MITRAL VALVE MV Area (PHT): 3.17 cm     SHUNTS MV Decel Time: 239 msec     Systemic VTI:  0.24 m MV E velocity: 129.00  cm/s  Systemic Diam: 1.70 cm MV A velocity: 38.30 cm/s MV E/A ratio:  3.37 Fransico Him MD Electronically signed by Fransico Him MD Signature Date/Time: 06/27/2019/12:22:29 PM    Final     Medications:   . apixaban  5 mg Oral BID  . atorvastatin  80 mg Oral q1800  . cholecalciferol  2,000 Units Oral Daily  . DULoxetine  20 mg Oral Daily  . hydrALAZINE  25 mg Oral Q8H  .  insulin aspart  0-9 Units Subcutaneous TID WC  . insulin glargine  30 Units Subcutaneous QHS  . pantoprazole  40 mg Oral Daily  . potassium chloride  40 mEq Oral Q4H  . rOPINIRole  0.25 mg Oral TID   Continuous Infusions: . 0.9 % NaCl with KCl 20 mEq / L 40 mL/hr at 06/28/19 1118  . magnesium sulfate bolus IVPB Stopped (06/27/19 1332)     LOS: 1 day   Radene Gunning NP [facility]   How to contact the [facility] Attending or Consulting provider [place] or covering provider during after hours [place], for this patient?  1. Check the care team in [facility] and look for a) attending/consulting [facility] provider listed and b) the [facility] team listed 2. Log into [email or web address] and use [place]'s universal password to access. If you do not have the password, please contact the hospital operator. 3. Locate the [facility] provider you are looking for under [facility] and page to a number that you can be directly reached. 4. If you still have difficulty reaching the provider, please page the [facility] (Director on Call) for the Hospitalists listed on amion for assistance.  06/28/2019, 1:44 PM

## 2019-06-28 NOTE — Plan of Care (Signed)
  Problem: Education: Goal: Knowledge of General Education information will improve Description: Including pain rating scale, medication(s)/side effects and non-pharmacologic comfort measures Outcome: Progressing   Problem: Health Behavior/Discharge Planning: Goal: Ability to manage health-related needs will improve Outcome: Progressing   Problem: Clinical Measurements: Goal: Ability to maintain clinical measurements within normal limits will improve Outcome: Progressing Goal: Will remain free from infection Outcome: Progressing Goal: Diagnostic test results will improve Outcome: Progressing Goal: Respiratory complications will improve Outcome: Progressing Goal: Cardiovascular complication will be avoided Outcome: Progressing   Problem: Activity: Goal: Risk for activity intolerance will decrease Outcome: Progressing   Problem: Nutrition: Goal: Adequate nutrition will be maintained Outcome: Progressing   Problem: Coping: Goal: Level of anxiety will decrease Outcome: Progressing   Problem: Elimination: Goal: Will not experience complications related to bowel motility Outcome: Progressing Goal: Will not experience complications related to urinary retention Outcome: Progressing   Problem: Pain Managment: Goal: General experience of comfort will improve Outcome: Progressing   Problem: Safety: Goal: Ability to remain free from injury will improve Outcome: Progressing   Problem: Skin Integrity: Goal: Risk for impaired skin integrity will decrease Outcome: Progressing   Problem: Education: Goal: Knowledge of disease or condition will improve Outcome: Progressing Goal: Knowledge of secondary prevention will improve Outcome: Progressing Goal: Knowledge of patient specific risk factors addressed and post discharge goals established will improve Outcome: Progressing Goal: Individualized Educational Video(s) Outcome: Progressing   Problem: Coping: Goal: Will verbalize  positive feelings about self Outcome: Progressing Goal: Will identify appropriate support needs Outcome: Progressing   Problem: Health Behavior/Discharge Planning: Goal: Ability to manage health-related needs will improve Outcome: Progressing   Problem: Self-Care: Goal: Ability to participate in self-care as condition permits will improve Outcome: Progressing   Problem: Nutrition: Goal: Risk of aspiration will decrease Outcome: Progressing   Problem: Ischemic Stroke/TIA Tissue Perfusion: Goal: Complications of ischemic stroke/TIA will be minimized Outcome: Progressing

## 2019-06-28 NOTE — Progress Notes (Signed)
Name: [name] DOB: Jul 03, 2025  Please be advised that the above-named patient will require a short-term nursing home stay -- anticipated 30 days or less for rehabilitation and strengthening. The plan is for return home.

## 2019-06-28 NOTE — Consult Note (Signed)
Consultation Note Date: 06/28/2019   Patient Name: [name]  DOB: Jun 10, 1925  MRN: [id number]  Age / Sex: 86 y.o., female   PCP: Baruch Gouty, FNP Referring Physician: Eugenie Filler, MD   REASON FOR CONSULTATION:Establishing goals of care  Palliative Care consult requested for goals of care discussion in this 86 y.o. female with multiple medical problems including type 2 diabetes, dyslipidemia, hypertension, GERD, diastolic CHF, scoliosis, CVA, osteoarthritis, and neuropathy. Patient presented to [facility] ED with complaints of lethargy and increased confusion. Patient received IM Medrol 40 mg 2/17/21for hip osteoarthritis and pain. He reported on admission that patient's health worsened since the injection. During ED work-up Chest x-ray, head CT, and UA showed no acute findings. MRI was positive for acute CVA and patient was transferred to [facility] for further work-up and stroke care.   Clinical Assessment and Goals of Care: I have reviewed medical records including lab results, imaging, Epic notes, and MAR, received report from the bedside RN, and assessed the patient. I met at the bedside with patient and her son, Keayra Graham [facility])  to discuss diagnosis prognosis, GOC, EOL wishes, disposition and options. Patient was awake, alert & oriented x3 with some noticeable memory deficit, intermittent confusion, and expressive aphasia.   I introduced Palliative Medicine as specialized medical care for people living with serious illness. It focuses on providing relief from the symptoms and stress of a serious illness. The goal is to improve quality of life for both the patient and the family. Patient and son verbalized understanding and appreciation of our involvement.   We discussed a brief life review of the patient, along with her functional and nutritional status. Mrs. Reach reports she has 2 sons. [name] is her 82 and her other son lives in [place]. She has 7  grandchildren. She reports she has lived alone for the past 6 years since the passing of her husband.   She reports prior to admission she was able to perform most ADLs independently. Her son prepared most of her meals and grocery shopping. Patient was still driving at the time of admission, reporting she would not drive long distances but could go to her appointments and to the store if needed. Son also confirms. Patient and son shares patient had a fall in November while at [place]. She reports tripping over curb. Son confirms health has somewhat gone downhill since then specifically with more joint and hip pain.   We discussed Her current illness and what it means in the larger context of Her on-going co-morbidities. With specific discussions regarding her hip pain, recent CVA, and overall functional and nutritional state.  Natural disease trajectory and expectations at EOL were discussed.  Patient reports she was unaware that she had a recent CVA. Expressing awareness of previous stroke without residual or complications over 15 years ago. She reports she really wants to get back home but also acknowledges changes in her health and needing more assistance over the past few weeks. Son verbalized agreement. He reports he is unfortunately unable to provide full-time care for her in the home.   I attempted to elicit values and goals of care important to the patient.    The difference between aggressive medical intervention and comfort care was considered in light of the patient's goals of care. Both patient and son verbalized although patient has declined over the past 2-3 months her quality of life was "fairly good". Son reports he is planning to  discuss with his mother that she most likely not be able to continue to drive for safety, however he is hopeful she can return to her baseline prior to hospitalization.   We discussed patient's previous baseline and possibility of new baseline. He remains hopeful  for improvement but also acknowledges age, health, and preparation for the worst (further decline, new baseline, no improvement). Patient reports she is not interested in aggressive interventions but would want work-up and an opportunity to show improvement. Son hopeful for rehabilitation to provide additional support prior to patient returning home.   Patient observed attempting to speaking and continued to force words with pausing. She then states "I know what I am trying to say but I can't say it or the words won't come out!" Son reports this has been new over the past 2-3 days along with worsening confusion at times. Discussed acute CVA and aphasia. They denied complications in swallowing only noting some decrease in appetite. Son reports preparing meals and when coming back later or the following day realizing she may have not eaten any or only a small amount.   Patient reports her pain seems more controlled and not a major concern for her at this time. She reports she was not taking Tylenol because it did not help but the Bayer back and body was helping. Would not want to add any additional medications at this time. Son reports patient was originally planning to go see an Orthopedic for further recommendations.     Advanced directives, concepts specific to code status, artifical feeding and hydration, and rehospitalization were considered and discussed. Patient does have a documented advanced directive. Son reports he will review and bring in directives. Patient and son confirms DNR and no artificial feedings.   Hospice and Palliative Care services outpatient were explained and offered. Patient and family verbalized their understanding and awareness of both palliative and hospice's goals and philosophy of care. Son is requesting outpatient palliative support at this time with awareness that he may transition to a more hospice and comfort approach of care in the future or at anytime.   Questions and  concerns were addressed.  Hard Choices booklet left for review. The family was encouraged to call with questions or concerns.  PMT will continue to support holistically.   SOCIAL HISTORY:     reports that she has never smoked. She has never used smokeless tobacco. She reports that she does not drink alcohol or use drugs.  CODE STATUS: DNR  ADVANCE DIRECTIVES: [name] (son/HCPOA)   SYMPTOM MANAGEMENT: per attending. Patient and son declining additional medications for pain at this time. Patient reports she is not hurting as she was previously, but will express need if became more of an issue.   Palliative Prophylaxis:   Aspiration, Bowel Regimen, Delirium Protocol and Frequent Pain Assessment  PSYCHO-SOCIAL/SPIRITUAL:  Support System: Family  Desire for further Chaplaincy support:No   Additional Recommendations (Limitations, Scope, Preferences):  Full Scope Treatment   PAST MEDICAL HISTORY: Past Medical History:  Diagnosis Date  . Arthritis   . Atrial fibrillation ([place])   . Cancer ([place])    breast  . Cataract   . Diabetes mellitus without complication ([place])   . Frequent UTI   . Hyperlipidemia   . Hypertension   . Neck pain   . Scoliosis   . Stroke [facility])     PAST SURGICAL HISTORY:  Past Surgical History:  Procedure Laterality Date  . APPENDECTOMY    . BREAST LUMPECTOMY Left   .  CHOLECYSTECTOMY    . JOINT REPLACEMENT     bilat knee   . REPLACEMENT TOTAL KNEE BILATERAL Bilateral   . TONSILLECTOMY      ALLERGIES:  is allergic to diltiazem hcl and ace inhibitors.   MEDICATIONS:  Current Facility-Administered Medications  Medication Dose Route Frequency Provider Last Rate Last Admin  . 0.9 % NaCl with KCl 20 mEq/ L  infusion   Intravenous Continuous Rosalin Hawking, MD 40 mL/hr at 06/28/19 0123 Rate Change at 06/28/19 0123  . acetaminophen (TYLENOL) suppository 650 mg  650 mg Rectal Q4H PRN Radene Gunning, NP      . acetaminophen (TYLENOL) tablet 650 mg   650 mg Oral Q6H PRN Elgergawy, Silver Huguenin, MD   650 mg at 06/27/19 0854  . apixaban (ELIQUIS) tablet 5 mg  5 mg Oral BID Elgergawy, Silver Huguenin, MD   5 mg at 06/27/19 2120  . atorvastatin (LIPITOR) tablet 80 mg  80 mg Oral q1800 Rosalin Hawking, MD      . cholecalciferol (VITAMIN D3) tablet 2,000 Units  2,000 Units Oral Daily Elgergawy, Silver Huguenin, MD   2,000 Units at 06/27/19 [phone]  . DULoxetine (CYMBALTA) DR capsule 20 mg  20 mg Oral Daily Elgergawy, Silver Huguenin, MD   20 mg at 06/27/19 0845  . hydrALAZINE (APRESOLINE) tablet 25 mg  25 mg Oral Q8H Rosalin Hawking, MD   25 mg at 06/28/19 0606  . ibuprofen (ADVIL) tablet 200 mg  200 mg Oral Q6H PRN Elgergawy, Silver Huguenin, MD   200 mg at 06/27/19 0615  . insulin aspart (novoLOG) injection 0-9 Units  0-9 Units Subcutaneous TID WC Elgergawy, Silver Huguenin, MD   1 Units at 06/27/19 1712  . insulin glargine (LANTUS) injection 30 Units  30 Units Subcutaneous QHS Elgergawy, Silver Huguenin, MD   30 Units at 06/27/19 2154  . magnesium sulfate IVPB 2 g 50 mL  2 g Intravenous Once Eugenie Filler, MD      . magnesium sulfate IVPB 4 g 100 mL  4 g Intravenous Once Eugenie Filler, MD   Stopped at 06/27/19 1332  . pantoprazole (PROTONIX) EC tablet 40 mg  40 mg Oral Daily Elgergawy, Silver Huguenin, MD   40 mg at 06/27/19 0845  . potassium chloride SA (KLOR-CON) CR tablet 40 mEq  40 mEq Oral Q4H Black, Karen M, NP      . rOPINIRole (REQUIP) tablet 0.25 mg  0.25 mg Oral TID Elgergawy, Silver Huguenin, MD   0.25 mg at 06/27/19 2120  . senna-docusate (Senokot-S) tablet 1 tablet  1 tablet Oral QHS PRN Radene Gunning, NP        VITAL SIGNS: BP (!) 161/87 (BP Location: Right Arm)   Pulse 70   Temp 98.5 F (36.9 C) (Oral)   Resp 18   Ht '4\' 10"'$  (1.473 m)   Wt 69.4 kg   SpO2 99%   BMI 31.98 kg/m  Filed Weights   06/26/19 1441 06/26/19 2130  Weight: 68 kg 69.4 kg    Estimated body mass index is 31.98 kg/m as calculated from the following:   Height as of this encounter: '4\' 10"'$  (1.473 m).   Weight  as of this encounter: 69.4 kg.  LABS: CBC:    Component Value Date/Time   WBC 10.8 (H) 06/28/2019 0410   HGB 14.8 06/28/2019 0410   HGB 14.5 06/22/2019 1300   HGB 13.2 03/10/2007 1344   HCT 46.6 (H) 06/28/2019 0410   HCT 44.8 06/22/2019  1300   HCT 37.0 03/10/2007 1344   PLT 208 06/28/2019 0410   PLT 249 06/22/2019 1300   Comprehensive Metabolic Panel:    Component Value Date/Time   NA 138 06/28/2019 0410   NA 143 06/22/2019 1300   K 2.8 (L) 06/28/2019 0410   CO2 26 06/28/2019 0410   BUN 7 (L) 06/28/2019 0410   BUN 21 06/22/2019 1300   CREATININE 0.66 06/28/2019 0410   CREATININE 0.77 12/06/2012 1609   ALBUMIN 3.2 (L) 06/27/2019 0148   ALBUMIN 4.2 06/22/2019 1300     Review of Systems  Constitutional: Positive for activity change.  Musculoskeletal: Positive for arthralgias.  Neurological: Positive for weakness.  Unless otherwise noted, a complete review of systems is negative.  Physical Exam General: NAD Cardiovascular: regular rate and rhythm Pulmonary: clear ant fields Abdomen: soft, nontender, + bowel sounds Extremities: no edema, no joint deformities Skin: no rashes Neurological: awake, alert & oriented x3, intermittent confusion noted, mood appropriate, follows commands, some aphasia noted   Prognosis: Unable to determine ([place])  Discharge Planning:  To Be Determined  Recommendations:  DNR/DNI-as confirmed by patient/son  Continue to treat the treatable, patient would not want aggressive treatments. Son remains hopeful for improvement and stability but realistic and prepared for the worst (no improvement, new baseline, further decline).  Open to rehab with hopes she would be able to return home eventually.   Outpatient Palliative support at discharge  PMT will continue to support and follow    Palliative Performance Scale: PPS 30%               Patient and son expressed understanding and was in agreement with this plan.   Thank you for  allowing the Palliative Medicine Team to assist in the care of this patient.  Time In: 1115 Time Out:1220 Time Total: 65 min.   Visit consisted of counseling and education dealing with the complex and emotionally intense issues of symptom management and palliative care in the setting of serious and potentially life-threatening illness.Greater than 50%  of this time was spent counseling and coordinating care related to the above assessment and plan.  Signed by:  Alda Lea, AGPCNP-BC Palliative Medicine Team  Phone: [phone] Fax: [phone] Pager: [phone] Amion: Bjorn Pippin

## 2019-06-28 NOTE — Progress Notes (Signed)
STROKE TEAM PROGRESS NOTE   INTERVAL HISTORY No family at bedside.  Patient awake alert, orientated x3.  No focal neurological deficit.  MRI showed left BG/thalamic infarct.  CT head neck showed left P2 stenosis.  Still has A. fib.  BP better controlled.  Vitals:   06/27/19 2042 06/27/19 2100 06/28/19 0018 06/28/19 0308  BP: (!) 202/92 (!) 186/88 (!) 166/80 (!) 161/87  Pulse: (!) 55 60 66 70  Resp: 18  18 18   Temp: 98.4 F (36.9 C)  98.3 F (36.8 C) 98.5 F (36.9 C)  TempSrc: Oral  Oral Oral  SpO2: 94%  99% 99%  Weight:      Height:        CBC:  Recent Labs  Lab 06/26/19 1556 06/28/19 0410  WBC 8.9 10.8*  NEUTROABS 7.7 9.1*  HGB 15.2* 14.8  HCT 48.8* 46.6*  MCV 83.1 80.5  PLT 229 [id number]    Basic Metabolic Panel:  Recent Labs  Lab 06/27/19 0148 06/28/19 0410  NA 138 138  K 3.3* 2.8*  CL 104 100  CO2 25 26  GLUCOSE 129* 85  BUN 11 7*  CREATININE 0.71 0.66  CALCIUM 8.5* 8.6*  MG 1.6* 1.9   Lipid Panel:     Component Value Date/Time   CHOL 240 (H) 06/27/2019 0148   CHOL 255 (H) 06/22/2019 1300   CHOL 176 11/09/2012 1604   TRIG 88 06/27/2019 0148   TRIG 189 (H) 07/14/2013 1525   TRIG 242 (H) 11/09/2012 1604   HDL 43 06/27/2019 0148   HDL 42 06/22/2019 1300   HDL 52 07/14/2013 1525   HDL 44 11/09/2012 1604   CHOLHDL 5.6 06/27/2019 0148   VLDL 18 06/27/2019 0148   LDLCALC 179 (H) 06/27/2019 0148   LDLCALC 194 (H) 06/22/2019 1300   LDLCALC 102 (H) 07/14/2013 1525   LDLCALC 84 11/09/2012 1604   HgbA1c:  Lab Results  Component Value Date   HGBA1C 6.3 (H) 06/27/2019   Urine Drug Screen: No results found for: LABOPIA, COCAINSCRNUR, LABBENZ, AMPHETMU, THCU, LABBARB  Alcohol Level No results found for: ETH  IMAGING past 48 hours CT ANGIO HEAD W OR WO CONTRAST  Result Date: 06/27/2019 CLINICAL DATA:  Stroke, follow up EXAM: CT ANGIOGRAPHY HEAD AND NECK TECHNIQUE: Multidetector CT imaging of the head and neck was performed using the standard protocol during  bolus administration of intravenous contrast. Multiplanar CT image reconstructions and MIPs were obtained to evaluate the vascular anatomy. Carotid stenosis measurements (when applicable) are obtained utilizing NASCET criteria, using the distal internal carotid diameter as the denominator. CONTRAST:  31mL OMNIPAQUE IOHEXOL 350 MG/ML SOLN COMPARISON:  MRI of the brain June 26, 2019. FINDINGS: CT HEAD FINDINGS Brain: No evidence of new acute infarction, hemorrhage, hydrocephalus, extra-axial collection or mass lesion/mass effect. Small hypodensities in the left lentiform nucleus and thalamus corresponds to the recent infarct seen on prior MRI. Old lacunar infarcts are noted in the right basal ganglia and thalamus. Prominent confluent hypodensity of the white matter of the cerebral hemispheres is most likely related to chronic small vessel ischemia. Vascular: Tree is Skull: Normal. Negative for fracture or focal lesion. Sinuses: Imaged portions are clear. Orbits: Bilateral eye surgery. Review of the MIP images confirms the above findings CTA NECK FINDINGS Aortic arch: Common origin of the innominate and left common carotid artery. Imaged portion shows no evidence of aneurysm or dissection. Calcified plaques are seen in the aortic arch and at the origin of the major neck arteries. No  significant stenosis of the major arch vessel origins. Prominent tortuosity of the major neck arteries may be related to longstanding hypertension. Right carotid system: Mixed density plaque is noted in the right carotid bifurcation, without hemodynamically significant stenosis. Left carotid system: Mixed density plaque is noted in the left carotid bifurcation, without hemodynamically significant stenosis Vertebral arteries: Mixed density plaque is noted at the origin of the right vertebral artery with approximately 50% stenosis. The left vertebral artery is dominant and its origin is widely patent. There is increased tortuosity and  mild luminal irregularity of the cervical vertebral arteries without stenosis. Skeleton: Degenerative changes of the cervical spine are noted with prominent facet degeneration at C3-4 and C4-5. Other neck: 1.1 mm hypodense left thyroid lobe nodule. Upper chest: Negative Review of the MIP images confirms the above findings CTA HEAD FINDINGS Anterior circulation: Calcified plaques are seen in the bilateral carotid siphons extending to the supraclinoid segment on the right side where there is approximately 40% stenosis at the level of the terminus. No significant stenosis of the intracranial left ICA. There is early bifurcation of the left MCA with mild to moderate stenosis at the origin of the superior division branch. The A1 segment of the right ACA is hypoplastic with a dominant left A1/ACA predominantly supplying both A2 segments. Mild luminal irregularity is noted throughout the bilateral MCA and ACA vascular trees, suggestive of intracranial atherosclerotic disease. A prominent right posterior communicating artery seen. Posterior circulation: The left vertebral artery is dominant. The basilar artery has normal course and caliber. The P1 segment of the right posterior cerebral artery is hypoplastic with most arterial flow coming from prominent posterior communicating artery (fetal PCA). Short segment of severe stenosis is noted at the P2 segment of the left posterior cerebral artery. There is also diffuse luminal irregularity throughout the bilateral PCA vascular trees, consistent with intracranial atherosclerotic disease. Venous sinuses: Minimally opacified given contrast timing. Anatomic variants: Right fetal PCA. Hypoplastic right A1/ACA with dominant left A1/ACA. Review of the MIP images confirms the above findings IMPRESSION: 1. Mixed density plaque in both carotid bulbs without hemodynamically significant stenosis. 2. A 50% stenosis at the origin of the right vertebral artery. 3. Short segment severe  stenosis at the P2 segment of the left posterior cerebral artery. 4. Mild to moderate stenosis at the origin of the superior division left MCA branch. 5. A 40% stenosis of the supraclinoid right ICA. 6. Diffuse luminal irregularity throughout the bilateral MCA, ACA and PCA vascular trees, consistent with intracranial atherosclerotic disease. Aortic Atherosclerosis (ICD10-I70.0). Electronically Signed   By: Pedro Earls M.D.   On: 06/27/2019 13:12   CT Head Wo Contrast  Result Date: 06/26/2019 CLINICAL DATA:  Confusion/altered mental status, shaking EXAM: CT HEAD WITHOUT CONTRAST TECHNIQUE: Contiguous axial images were obtained from the base of the skull through the vertex without intravenous contrast. COMPARISON:  11/03/2018 FINDINGS: Motion degraded images. Brain: No evidence of acute infarction, hemorrhage, hydrocephalus, extra-axial collection or mass lesion/mass effect. Global cortical atrophy.  Secondary ventricular prominence. Extensive subcortical white matter and periventricular small vessel ischemic changes. Vascular: Intracranial atherosclerosis. Skull: Normal. Negative for fracture or focal lesion. Sinuses/Orbits: Right frontal sinus is underpneumatized. Visualized paranasal sinuses and mastoid air cells are otherwise clear. Other: None. IMPRESSION: Motion degraded images. No evidence of acute intracranial abnormality. Atrophy with small vessel ischemic changes. Electronically Signed   By: Julian Hy M.D.   On: 06/26/2019 16:30   CT ANGIO NECK W OR WO CONTRAST  Result Date:  06/27/2019 CLINICAL DATA:  Stroke, follow up EXAM: CT ANGIOGRAPHY HEAD AND NECK TECHNIQUE: Multidetector CT imaging of the head and neck was performed using the standard protocol during bolus administration of intravenous contrast. Multiplanar CT image reconstructions and MIPs were obtained to evaluate the vascular anatomy. Carotid stenosis measurements (when applicable) are obtained utilizing NASCET  criteria, using the distal internal carotid diameter as the denominator. CONTRAST:  99mL OMNIPAQUE IOHEXOL 350 MG/ML SOLN COMPARISON:  MRI of the brain June 26, 2019. FINDINGS: CT HEAD FINDINGS Brain: No evidence of new acute infarction, hemorrhage, hydrocephalus, extra-axial collection or mass lesion/mass effect. Small hypodensities in the left lentiform nucleus and thalamus corresponds to the recent infarct seen on prior MRI. Old lacunar infarcts are noted in the right basal ganglia and thalamus. Prominent confluent hypodensity of the white matter of the cerebral hemispheres is most likely related to chronic small vessel ischemia. Vascular: Tree is Skull: Normal. Negative for fracture or focal lesion. Sinuses: Imaged portions are clear. Orbits: Bilateral eye surgery. Review of the MIP images confirms the above findings CTA NECK FINDINGS Aortic arch: Common origin of the innominate and left common carotid artery. Imaged portion shows no evidence of aneurysm or dissection. Calcified plaques are seen in the aortic arch and at the origin of the major neck arteries. No significant stenosis of the major arch vessel origins. Prominent tortuosity of the major neck arteries may be related to longstanding hypertension. Right carotid system: Mixed density plaque is noted in the right carotid bifurcation, without hemodynamically significant stenosis. Left carotid system: Mixed density plaque is noted in the left carotid bifurcation, without hemodynamically significant stenosis Vertebral arteries: Mixed density plaque is noted at the origin of the right vertebral artery with approximately 50% stenosis. The left vertebral artery is dominant and its origin is widely patent. There is increased tortuosity and mild luminal irregularity of the cervical vertebral arteries without stenosis. Skeleton: Degenerative changes of the cervical spine are noted with prominent facet degeneration at C3-4 and C4-5. Other neck: 1.1 mm  hypodense left thyroid lobe nodule. Upper chest: Negative Review of the MIP images confirms the above findings CTA HEAD FINDINGS Anterior circulation: Calcified plaques are seen in the bilateral carotid siphons extending to the supraclinoid segment on the right side where there is approximately 40% stenosis at the level of the terminus. No significant stenosis of the intracranial left ICA. There is early bifurcation of the left MCA with mild to moderate stenosis at the origin of the superior division branch. The A1 segment of the right ACA is hypoplastic with a dominant left A1/ACA predominantly supplying both A2 segments. Mild luminal irregularity is noted throughout the bilateral MCA and ACA vascular trees, suggestive of intracranial atherosclerotic disease. A prominent right posterior communicating artery seen. Posterior circulation: The left vertebral artery is dominant. The basilar artery has normal course and caliber. The P1 segment of the right posterior cerebral artery is hypoplastic with most arterial flow coming from prominent posterior communicating artery (fetal PCA). Short segment of severe stenosis is noted at the P2 segment of the left posterior cerebral artery. There is also diffuse luminal irregularity throughout the bilateral PCA vascular trees, consistent with intracranial atherosclerotic disease. Venous sinuses: Minimally opacified given contrast timing. Anatomic variants: Right fetal PCA. Hypoplastic right A1/ACA with dominant left A1/ACA. Review of the MIP images confirms the above findings IMPRESSION: 1. Mixed density plaque in both carotid bulbs without hemodynamically significant stenosis. 2. A 50% stenosis at the origin of the right vertebral artery. 3. Short  segment severe stenosis at the P2 segment of the left posterior cerebral artery. 4. Mild to moderate stenosis at the origin of the superior division left MCA branch. 5. A 40% stenosis of the supraclinoid right ICA. 6. Diffuse luminal  irregularity throughout the bilateral MCA, ACA and PCA vascular trees, consistent with intracranial atherosclerotic disease. Aortic Atherosclerosis (ICD10-I70.0). Electronically Signed   By: Pedro Earls M.D.   On: 06/27/2019 13:12   MR BRAIN WO CONTRAST  Result Date: 06/26/2019 CLINICAL DATA:  Altered mental status.  Confusion. EXAM: MRI HEAD WITHOUT CONTRAST TECHNIQUE: Multiplanar, multiecho pulse sequences of the brain and surrounding structures were obtained without intravenous contrast. COMPARISON:  Head CT same day.  MRI 05/28/2017. FINDINGS: Brain: The study does suffer from some motion degradation. There is a small focus of acute infarction at the inferior basal ganglia on the left. Very small focus of involvement of the medial left thalamus. No other acute infarction. Elsewhere, there chronic small-vessel ischemic changes affecting the pons. No focal cerebellar insult. Chronic small-vessel infarctions affect the thalami, and basal ganglia. Confluent chronic small vessel disease affects the cerebral hemispheric white matter. No mass lesion, hemorrhage, hydrocephalus or extra-axial collection. Vascular: Major vessels at the base of the brain show flow. Skull and upper cervical spine: Negative Sinuses/Orbits: Clear/normal Other: None IMPRESSION: Background pattern of extensive chronic small-vessel ischemic changes throughout the brain. Acute subcentimeter infarction at the inferior basal ganglia on the left without mass effect or hemorrhage. Few mm size focus of acute infarction of the medial left thalamus. Electronically Signed   By: Nelson Chimes M.D.   On: 06/26/2019 23:09   DG Chest Portable 1 View  Result Date: 06/26/2019 CLINICAL DATA:  Altered mental status EXAM: PORTABLE CHEST 1 VIEW COMPARISON:  03/04/2019 FINDINGS: Lungs are clear.  No pleural effusion or pneumothorax. Skin fold at the right lung apex. The heart is normal in size. Prominent epicardial fat along the left heart  border. Thoracic aortic atherosclerosis. IMPRESSION: No evidence of acute cardiopulmonary disease. Thoracic aortic atherosclerosis. Electronically Signed   By: Julian Hy M.D.   On: 06/26/2019 16:23   ECHOCARDIOGRAM COMPLETE  Result Date: 06/27/2019    ECHOCARDIOGRAM REPORT   Patient Name:   [name] Date of Exam: 06/27/2019 Medical Rec #:  [id number]        Height:       58.0 in Accession #:    [id number]       Weight:       153.0 lb Date of Birth:  08/24/1925        BSA:          1.625 m Patient Age:    46 years         BP:           183/96 mmHg Patient Gender: F                HR:           75 bpm. Exam Location:  Inpatient Procedure: 2D Echo, Cardiac Doppler and Color Doppler Indications:    Stroke 434.91  History:        Patient has prior history of Echocardiogram examinations, most                 recent 12/07/2017. CHF, Stroke; Risk Factors:Hypertension,                 Diabetes, Dyslipidemia and Non-Smoker. DOE. GERD.  Sonographer:    Vickie Epley  RDCS Referring Phys: [place]  1. Left ventricular ejection fraction, by estimation, is 60 to 65%. The left ventricle has normal function. The left ventricle has no regional wall motion abnormalities.LV diastolic filling could not be assessed due to underlying atrial fibrillation.  2. Right ventricular systolic function is normal. The right ventricular size is normal.  3. The mitral valve is degenerative. No evidence of mitral valve regurgitation. No evidence of mitral stenosis.  4. The aortic valve is tricuspid. Aortic valve regurgitation is not visualized. Mild to moderate aortic valve sclerosis/calcification is present, without any evidence of aortic stenosis.  5. The inferior vena cava is normal in size with greater than 50% respiratory variability, suggesting right atrial pressure of 3 mmHg.  6. Left atrial size was mild to moderately dilated. FINDINGS  Left Ventricle: Left ventricular ejection fraction, by estimation, is 60  to 65%. The left ventricle has normal function. The left ventricle has no regional wall motion abnormalities. The left ventricular internal cavity size was normal in size. There is  no left ventricular hypertrophy. LC diastolic filling could not be assessed due to underlying atrial fibrillation. Elevated left ventricular end-diastolic pressure. Right Ventricle: The right ventricular size is normal. No increase in right ventricular wall thickness. Right ventricular systolic function is normal. Left Atrium: Left atrial size was mild to moderately dilated. Right Atrium: Right atrial size was normal in size. Pericardium: There is no evidence of pericardial effusion. Mitral Valve: The mitral valve is degenerative in appearance. There is mild calcification of the anterior mitral valve leaflet(s). Normal mobility of the mitral valve leaflets. No evidence of mitral valve regurgitation. No evidence of mitral valve stenosis. Tricuspid Valve: The tricuspid valve is normal in structure. Tricuspid valve regurgitation is trivial. No evidence of tricuspid stenosis. Aortic Valve: The aortic valve is tricuspid. . There is moderate thickening and mild calcification of the aortic valve. Aortic valve regurgitation is not visualized. Mild to moderate aortic valve sclerosis/calcification is present, without any evidence of aortic stenosis. There is moderate thickening of the aortic valve. There is mild calcification of the aortic valve. Pulmonic Valve: The pulmonic valve was normal in structure. Pulmonic valve regurgitation is not visualized. No evidence of pulmonic stenosis. Aorta: The aortic root is normal in size and structure. Venous: The inferior vena cava is normal in size with greater than 50% respiratory variability, suggesting right atrial pressure of 3 mmHg. IAS/Shunts: No atrial level shunt detected by color flow Doppler.  LEFT VENTRICLE PLAX 2D LVIDd:         5.20 cm  Diastology LVIDs:         3.40 cm  LV e' lateral:   8.84  cm/s LV PW:         0.90 cm  LV E/e' lateral: 14.6 LV IVS:        0.90 cm  LV e' medial:    6.08 cm/s LVOT diam:     1.70 cm  LV E/e' medial:  21.2 LV SV:         55.16 ml LV SV Index:   33.94 LVOT Area:     2.27 cm  RIGHT VENTRICLE RV S prime:     12.30 cm/s TAPSE (M-mode): 1.5 cm LEFT ATRIUM             Index LA diam:        5.20 cm 3.20 cm/m LA Vol (A2C):   58.2 ml 35.81 ml/m LA Vol (A4C):   53.3 ml 32.80  ml/m LA Biplane Vol: 56.4 ml 34.70 ml/m  AORTIC VALVE LVOT Vmax:   104.00 cm/s LVOT Vmean:  80.000 cm/s LVOT VTI:    0.243 m  AORTA Ao Root diam: 3.00 cm MITRAL VALVE MV Area (PHT): 3.17 cm     SHUNTS MV Decel Time: 239 msec     Systemic VTI:  0.24 m MV E velocity: 129.00 cm/s  Systemic Diam: 1.70 cm MV A velocity: 38.30 cm/s MV E/A ratio:  3.37 Fransico Him MD Electronically signed by Fransico Him MD Signature Date/Time: 06/27/2019/12:22:29 PM    Final     PHYSICAL EXAM  Temp:  [97.5 F (36.4 C)-98.5 F (36.9 C)] 98.1 F (36.7 C) (02/23 1640) Pulse Rate:  [55-88] 83 (02/23 1640) Resp:  [18] 18 (02/23 1640) BP: (138-202)/(79-102) 159/102 (02/23 1640) SpO2:  [90 %-99 %] 95 % (02/23 1640)  General - Well nourished, well developed, in no apparent distress.  Ophthalmologic - fundi not visualized due to noncooperation.  Cardiovascular - irregularly irregular heart rate and rhythm.  Mental Status -  Level of arousal and orientation to time, place, and person were intact. Language including expression, naming, repetition, comprehension was assessed and found intact. Fund of Knowledge was assessed and was intact.  Cranial Nerves II - XII - II - Visual field intact OU. III, IV, VI - Extraocular movements intact. V - Facial sensation intact bilaterally. VII - Facial movement intact bilaterally. VIII - Hearing & vestibular intact bilaterally. X - Palate elevates symmetrically. XI - Chin turning & shoulder shrug intact bilaterally. XII - Tongue protrusion intact.  Motor Strength - The  patient's strength was symmetric in all extremities and pronator drift was absent.  Bulk was normal and fasciculations were absent.   Motor Tone - Muscle tone was assessed at the neck and appendages and was normal.  Reflexes - The patient's reflexes were symmetrical in all extremities and she had no pathological reflexes.  Sensory - Light touch, temperature/pinprick were assessed and were symmetrical.    Coordination - The patient had normal movements in the hands with no ataxia or dysmetria.  Tremor was absent.  Gait and Station - deferred.   ASSESSMENT/PLAN Ms. [name] is a 86 y.o. female with history of hypertension, hyperlipidemia, diabetes and atrial fibrillation on Eliquis presenting with confusion over the last 2 weeks with significant worsening day of admission.   Stroke:   L BG/thalamic infarcts - likely small vessel disease with known atrial fibrillation on Eliquis  CT head No acute abnormality. Small vessel disease. Atrophy.   MRI  Extensive small vessel disease. Acute subcentimeter inferior L basal ganglia and medial thalamic infarcts  CTA head & neck mixed B ICA bulb plaque w/o stenosis. R VA origin 50% stenosis. Severe L P2 stenosis. Mild to moderate stenosis origin L MCA superior branch. R supraclinoid 40% stenosis. Diffuse intracranial atherosclerosis.    2D Echo EF 60-65%. No embolus seen. Pt in AF  LDL 179  HgbA1c 6.3  Eliquis for VTE prophylaxis  Eliquis (apixaban) daily prior to admission, now on Eliquis (apixaban).  Recommend to add aspirin 81 onto Eliquis for stroke prevention.  Therapy recommendations:  SNF  Disposition:  pending   Atrial Fibrillation, chronic  Home anticoagulation:  Eliquis (apixaban) daily continued in the hospital . Continue Eliquis (apixaban) daily at discharge   Hypertension  Stable on the high end  On hydralazine . Permissive hypertension (OK if <180/105) but gradually normalize in 2-3 days . Long-term BP goal  normotensive  Hyperlipidemia  Home meds:  No statin  Now on lipitor 80   LDL 179, goal < 70  Continue statin at discharge  Diabetes type II Controlled  HgbA1c 6.3, goal < 7.0  CBGs  SSI  Close PCP follow-up  Other Stroke Risk Factors  Advanced age  Obesity, Body mass index is 31.98 kg/m., recommend weight loss, diet and exercise as appropriate   Hx chronic diastolic CHF  Other Active Problems  Baseline cognitive decline, previously undiagnosed  Osteoarthritis that limits mobility - chronic R knee pain/L hip pain - seen by Dr. Tomi Likens in 2015  Hx breast cancer   Mild hypokalemia  Depression on cymbalta  Hospital day # 1  Neurology will sign off. Please call with questions. Pt will follow up with Dr. Tomi Likens at [facility] in about 4 weeks. Thanks for the consult.  Rosalin Hawking, MD PhD Stroke Neurology 06/28/2019 5:01 PM  To contact Stroke Continuity provider, please refer to [email or web address] After hours, contact General Neurology

## 2019-06-29 LAB — GLUCOSE, CAPILLARY
Glucose-Capillary: 65 mg/dL — ABNORMAL LOW (ref 70–99)
Glucose-Capillary: 130 mg/dL — ABNORMAL HIGH (ref 70–99)
Glucose-Capillary: 119 mg/dL — ABNORMAL HIGH (ref 70–99)
Glucose-Capillary: 81 mg/dL (ref 70–99)
Glucose-Capillary: 148 mg/dL — ABNORMAL HIGH (ref 70–99)

## 2019-06-29 LAB — BASIC METABOLIC PANEL
Anion gap: 9 (ref 5–15)
BUN: 11 mg/dL (ref 8–23)
CO2: 24 mmol/L (ref 22–32)
Calcium: 8.6 mg/dL — ABNORMAL LOW (ref 8.9–10.3)
Chloride: 102 mmol/L (ref 98–111)
Creatinine, Ser: 0.62 mg/dL (ref 0.44–1.00)
GFR calc Af Amer: >60 mL/min (ref >60)
GFR calc non Af Amer: >60 mL/min (ref >60)
Glucose, Bld: 82 mg/dL (ref 70–99)
Potassium: 3.6 mmol/L (ref 3.5–5.1)
Sodium: 135 mmol/L (ref 135–145)

## 2019-06-29 LAB — MAGNESIUM: Magnesium: 2 mg/dL (ref 1.7–2.4)

## 2019-06-29 NOTE — Progress Notes (Addendum)
  Speech Language Pathology Treatment: Cognitive-Linquistic  Patient Details Name: [name] MRN: [id number] DOB: [birth year] Today's Date: 06/29/2019 Time: [id number] SLP Time Calculation (min) (ACUTE ONLY): 22 min  Assessment / Plan / Recommendation Clinical Impression  Patient seen at bedside for skilled ST targeting cognitive-communication skills. Patient's room phone ringing when ST entered, patient attempting to "answer" call bell instead of telephone. ST provided mod verbal/tactile cues for telephone use. Patient demonstrated understanding. Oriented to place, year, self, but stating month as "March." When given verbal cues "It is the month before March," patient answering "April." Pt demonstrated ability to read clock analog in room correctly. Patient's son, [name], entered during session. He reports patient lives on his property "in a house about 60 feet from mine" and states he's noticed an acute change in her cognition recently, including "short term memory" deficits and states "she talks gibberish sometimes." Patient demonstrated language of confusion during this session (e.g., "don't mess up my party, is it not the 17th?") but was able to name items in confrontation naming task without difficulty. ST gave some portions of the COGNISTAT, patient with impairment identified in the area of memory. Scores as follows:  Attention: 7/8 [facility] Naming: 7/8 WFL Memory: 1/12   ST to follow as per POC.    HPI HPI: 86 y.o. female, who lives independently with support from her son/dtr-in-law, and with PMH of hypertension, hyperlipidemia, diabetes and history of atrial fibrillation was admitted for confusion. MRI showed extensive chronic small vessel ischemic changes throughout the brain; acute subcentimeter infarct at the inferior basal ganglia; a few millimeter size foci of acute infarction of the medial left thalamus.  Per notes, son reports steady decline in mentation the last several months.        SLP Plan  Continue with current plan of care       Recommendations                   Follow up Recommendations: Skilled Nursing facility;24 hour supervision/assistance SLP Visit Diagnosis: Cognitive communication deficit [id number]) Plan: Continue with current plan of care       [place] 06/29/2019, 10:24 AM  Marina Goodell, M.Ed., [facility] Acute Rehabilitation [phone]: Acute Rehab office [phone] - pager

## 2019-06-29 NOTE — Progress Notes (Signed)
Occupational Therapy Treatment Patient Details Name: [name] MRN: [id number] DOB: 03/04/1926 Today's Date: 06/29/2019    History of present illness 86 y.o. female presenting with AMS with decrease function over last 2 months. Son reporting progressive confusion and lethargy over last week. MRI showing acute subcentimeter infarction at the inferior basal ganglia on the left. PMH including DM type 2, dyslipidemia, HTN, GERD, and neuropathy.    OT comments  Patient continues to make steady progress towards goals in skilled OT session. Patient's session encompassed functional ambulation of household distances to use restroom. Pt continues to remain unaware of overall cognitive deficits (mildly impulsive), requiring max cues to ambulate safely. Pt also required increased cues (tactile and verbal) for walker and clothing management in bathroom, however would redirect. Pt would continue to benefit from skilled services in order to promote increased independence to regain prior level of function; will follow acutely.    Follow Up Recommendations  SNF;Supervision/Assistance - 24 hour    Equipment Recommendations  Other (comment)(Defer to next level of care)    Recommendations for Other Services      Precautions / Restrictions Precautions Precautions: Fall Restrictions Weight Bearing Restrictions: No       Mobility Bed Mobility Overal bed mobility: Needs Assistance Bed Mobility: Supine to Sit     Supine to sit: Min assist;Mod assist     General bed mobility comments: Min/Mod A to pull into upright posture with holding therapist's hand  Transfers Overall transfer level: Needs assistance Equipment used: Rolling walker (2 wheeled) Transfers: Sit to/from Stand Sit to Stand: Min assist         General transfer comment: Min A for power up and to gain balance Mod A from toilet    Balance Overall balance assessment: Needs assistance Sitting-balance support: No upper  extremity supported;Feet supported Sitting balance-Leahy Scale: Fair     Standing balance support: Bilateral upper extremity supported;During functional activity Standing balance-Leahy Scale: Poor Standing balance comment: Reliant on UE support and physical A                           ADL either performed or assessed with clinical judgement   ADL Overall ADL's : Needs assistance/impaired             Lower Body Bathing: Moderate assistance;Sit to/from stand Lower Body Bathing Details (indicate cue type and reason): Peri care     Lower Body Dressing: Moderate assistance;Sit to/from stand   Toilet Transfer: Minimal assistance;Moderate assistance;Ambulation;RW Toilet Transfer Details (indicate cue type and reason): Min A for sit<>Stand and power up. Mod A for LOB during mobility Toileting- Clothing Manipulation and Hygiene: Moderate assistance Toileting - Clothing Manipulation Details (indicate cue type and reason): Threaded underwear in sitting     Functional mobility during ADLs: Minimal assistance;Moderate assistance;Rolling walker General ADL Comments: Pt with decreased strength, balance, cognition, and safety     Vision Baseline Vision/History: Wears glasses Wears Glasses: At all times Patient Visual Report: No change from baseline     Perception     Praxis      Cognition Arousal/Alertness: Awake/alert Behavior During Therapy: WFL for tasks assessed/performed Overall Cognitive Status: Impaired/Different from baseline Area of Impairment: Attention;Following commands;Problem solving;Awareness                   Current Attention Level: Sustained   Following Commands: Follows one step commands with increased time;Follows multi-step commands inconsistently   Awareness: Intellectual Problem Solving: Slow  processing;Difficulty sequencing;Requires verbal cues General Comments: Pt willing to participated in sesison, however required max verbal cues  to ambulate with RW, often reaching outside of base of support to grab onto railings and doorways, continues to demonstrate poor awareness of deficits        Exercises     Shoulder Instructions       General Comments      Pertinent Vitals/ Pain       Pain Assessment: No/denies pain  Home Living                                          Prior Functioning/Environment              Frequency  Min 2X/week        Progress Toward Goals  OT Goals(current goals can now be found in the care plan section)  Progress towards OT goals: Progressing toward goals  Acute Rehab OT Goals Patient Stated Goal: "Return home" OT Goal Formulation: With patient/family Time For Goal Achievement: 07/11/19 Potential to Achieve Goals: Good  Plan Discharge plan remains appropriate    Co-evaluation                 AM-PAC OT "6 Clicks" Daily Activity     Outcome Measure   Help from another person eating meals?: None Help from another person taking care of personal grooming?: None Help from another person toileting, which includes using toliet, bedpan, or urinal?: A Lot Help from another person bathing (including washing, rinsing, drying)?: A Lot Help from another person to put on and taking off regular upper body clothing?: A Little Help from another person to put on and taking off regular lower body clothing?: A Lot 6 Click Score: 17    End of Session Equipment Utilized During Treatment: Rolling walker;Gait belt  OT Visit Diagnosis: Unsteadiness on feet (R26.81);Other abnormalities of gait and mobility (R26.89);Muscle weakness (generalized) (M62.81)   Activity Tolerance Patient tolerated treatment well   Patient Left in chair;with call bell/phone within reach;with chair alarm set   Nurse Communication Mobility status(Pure wick placement)        Time: [id number] OT Time Calculation (min): 30 min  Charges: OT General Charges $OT Visit: 1 Visit OT  Treatments $Self Care/Home Management : 23-37 mins  Corinne Ports E. Bralon Antkowiak, COTA/L Acute Rehabilitation Services [place] 06/29/2019, 1:02 PM

## 2019-06-29 NOTE — Progress Notes (Signed)
Physical Therapy Treatment Patient Details Name: NOVIA KANIA MRN: [id number] DOB: [birth year] Today's Date: 06/29/2019    History of Present Illness Pt is a 86 y.o. female presenting with AMS with decrease function over last 2 months. Son reporting progressive confusion and lethargy over last week. MRI showing acute subcentimeter infarction at the inferior basal ganglia on the left. PMH including DM type 2, dyslipidemia, HTN, GERD, and neuropathy.     PT Comments    Pt making steady progress with functional mobility; however, she remains limited secondary to weakness, balance and coordination deficits, as well as cognitive deficits. Continue to recommend pt d/c to SNF for further PT services prior to returning home. Pt would continue to benefit from skilled physical therapy services at this time while admitted and after d/c to address the below listed limitations in order to improve overall safety and independence with functional mobility.    Follow Up Recommendations  SNF     Equipment Recommendations  Rolling walker with 5" wheels;3in1 (PT)    Recommendations for Other Services       Precautions / Restrictions Precautions Precautions: Fall Restrictions Weight Bearing Restrictions: No    Mobility  Bed Mobility Overal bed mobility: Needs Assistance Bed Mobility: Supine to Sit;Sit to Supine     Supine to sit: Mod assist Sit to supine: Min assist   General bed mobility comments: increased time and effort, HOB elevated, use of bed rails, mod A for trunk elevation to achieve upright sitting at EOB; min A to return bilateral LEs onto bed  Transfers Overall transfer level: Needs assistance Equipment used: Rolling walker (2 wheeled) Transfers: Sit to/from Stand Sit to Stand: Mod assist         General transfer comment: mod A to power into standing from sitting EOB (low bed position)  Ambulation/Gait Ambulation/Gait assistance: Min assist;Min guard Gait Distance  (Feet): 20 Feet Assistive device: Rolling walker (2 wheeled) Gait Pattern/deviations: Decreased step length - right;Decreased step length - left;Shuffle;Decreased dorsiflexion - right;Decreased dorsiflexion - left;Narrow base of support Gait velocity: decreased   General Gait Details: frequent cueing needed for improved step length on R; pt with uncoordinated steps and poor balance requiring frequent min A and assistance needed with RW management   Stairs             Wheelchair Mobility    Modified [name] (Stroke Patients Only) Modified [name] (Stroke Patients Only) Pre-Morbid [name] Score: No symptoms Modified [name]: Moderately severe disability     Balance Overall balance assessment: Needs assistance Sitting-balance support: No upper extremity supported;Feet supported Sitting balance-Leahy Scale: Fair     Standing balance support: Bilateral upper extremity supported;During functional activity Standing balance-Leahy Scale: Poor Standing balance comment: Reliant on UE support and physical A                            Cognition Arousal/Alertness: Awake/alert Behavior During Therapy: WFL for tasks assessed/performed Overall Cognitive Status: Impaired/Different from baseline Area of Impairment: Attention;Memory;Following commands;Safety/judgement;Problem solving;Awareness                   Current Attention Level: Sustained Memory: Decreased short-term memory Following Commands: Follows one step commands with increased time;Follows multi-step commands inconsistently Safety/Judgement: Decreased awareness of deficits Awareness: Intellectual Problem Solving: Slow processing;Difficulty sequencing;Requires verbal cues General Comments: Pt willing to participated in sesison, however required max verbal cues to ambulate with RW, often reaching outside of base of support to grab onto  railings and doorways, continues to demonstrate poor awareness of deficits       Exercises      General Comments        Pertinent Vitals/Pain Pain Assessment: No/denies pain    Home Living                      Prior Function            PT Goals (current goals can now be found in the care plan section) Acute Rehab PT Goals Patient Stated Goal: "Return home" PT Goal Formulation: With patient Time For Goal Achievement: 07/11/19 Potential to Achieve Goals: Good Progress towards PT goals: Progressing toward goals    Frequency    Min 3X/week      PT Plan Current plan remains appropriate    Co-evaluation              AM-PAC PT "6 Clicks" Mobility   Outcome Measure  Help needed turning from your back to your side while in a flat bed without using bedrails?: A Little Help needed moving from lying on your back to sitting on the side of a flat bed without using bedrails?: A Little Help needed moving to and from a bed to a chair (including a wheelchair)?: A Lot Help needed standing up from a chair using your arms (e.g., wheelchair or bedside chair)?: A Little Help needed to walk in hospital room?: A Lot Help needed climbing 3-5 steps with a railing? : A Lot 6 Click Score: 15    End of Session Equipment Utilized During Treatment: Gait belt Activity Tolerance: Patient tolerated treatment well Patient left: in bed;with call bell/phone within reach Nurse Communication: Mobility status PT Visit Diagnosis: Unsteadiness on feet (R26.81);Other abnormalities of gait and mobility (R26.89);Other symptoms and signs involving the nervous system (R29.898)     Time: 1413-1430 PT Time Calculation (min) (ACUTE ONLY): 17 min  Charges:  $Gait Training: 8-22 mins                     Anastasio Champion, DPT  Acute Rehabilitation Services Pager [phone] Office [place] 06/29/2019, 4:30 PM

## 2019-06-29 NOTE — TOC Progression Note (Signed)
Transition of Care (TOC) - Progression Note    Patient Details  Name: [name] MRN: [id number] Date of Birth: 07/18/1925  Transition of Care (TOC) CM/SW Contact  Elizabeth M Paisley, [name] Phone Number: 06/29/2019, 11:55 AM  Clinical Narrative:   CSW met with patient and son, Andy, at bedside to provide bed offers. CSW answered questions and discussed with Andy other ways to research some of the SNF options available. Andy appreciative of information and will update CSW with other questions or choice. CSW to follow.    Expected Discharge Plan: Skilled Nursing Facility Barriers to Discharge: Continued Medical Work up, Insurance Authorization  Expected Discharge Plan and Services Expected Discharge Plan: Skilled Nursing Facility       Living arrangements for the past 2 months: Single Family Home                                       Social Determinants of Health (SDOH) Interventions    Readmission Risk Interventions No flowsheet data found.  

## 2019-06-29 NOTE — Progress Notes (Signed)
[facility] PROGRESS NOTE    Progress Note  CHEISEA MURRAH  [id number] DOB: Nov 29, 1925 DOA: 06/26/2019 PCP: Baruch Gouty, FNP     Brief Narrative:   [name] is an 86 y.o. female past medical history of type 2 diabetes mellitus, hyperlipidemia, hypertension frequent UTIs CVAs presents to the ED with altered mental status.  Per the son the patient has been gradually declining over the last 2 months with inability to perform ADLs and worsening arthritis and decreased mobility, in the ED she was CT of the head was negative however her MRI was positive for CVA.  Assessment/Plan:   CVA (cerebral vascular accident) ([place]) HgbA1c 6.3, fasting lipid panel LDL 179 MRI, MRA of the brain without contrast inferior left basal ganglia and thalamic stroke PT pending, OT evaluated the patient recommended skilled, Speech consult regular diet. Transthoracic Echo: 2D echo showed an EF of 60% no embolus patient in A. fib. Continue Eliquis, neurology was consulted recommended to add aspirin. Continue Lipitor. BP goal: permissive HTN upto 220/120 mmHg Physical therapy evaluated the patient recommended skilled nursing facility, [facility] team has been consulted and awaiting bed placement.  Acute metabolic encephalopathy: Likely secondary to CVA has gradually improved. Continue supportive care.  Chronic atrial fibrillation: Continue Eliquis.  Essential hypertension: Allow permissive hypertension, gradually will normalize within a few days. Will need to follow-up with PCP as an outpatient.  Hyperlipidemia associated with type 2 diabetes mellitus ([place]) Previously on statin we have restarted Lipitor 80.  Diabetic polyneuropathy associated with type 2 diabetes mellitus ([place]) With an A1c of 6.3, continue long-acting insulin plus sliding scale.  Chronic diastolic CHF (congestive heart failure) (HCC) On current regimen.  Appears to be compensated.  Hypokalemia: Replete orally now  improved.  Recurrent depression: Continue Cymbalta.  Chronic pain of right knee Continue current regimen.    DVT prophylaxis: eluquis Family Communication:none Disposition Plan/Barrier to D/C: Came from skilled nursing facility physical therapy evaluated the patient recommended skilled nursing facility.  Awaiting placement.  Code Status:     Code Status Orders  (From admission, onward)         Start     Ordered   06/27/19 0908  Do not attempt resuscitation (DNR)  Continuous    Question Answer Comment  In the event of cardiac or respiratory ARREST Do not call a "code blue"   In the event of cardiac or respiratory ARREST Do not perform Intubation, CPR, defibrillation or ACLS   In the event of cardiac or respiratory ARREST Use medication by any route, position, wound care, and other measures to relive pain and suffering. May use oxygen, suction and manual treatment of airway obstruction as needed for comfort.      06/27/19 0911        Code Status History    Date Active Date Inactive Code Status Order ID Comments User Context   06/26/2019 2109 06/27/2019 0911 [id number] [id number]  Albertine Patricia, MD Inpatient   06/26/2019 2010 06/26/2019 2109 DNR [id number]  Albertine Patricia, MD ED   12/06/2017 1400 12/10/2017 1940 DNR [id number]  Rodena Goldmann, DO ED   Advance Care Planning Activity        IV Access:    Peripheral IV   Procedures and diagnostic studies:   CT ANGIO HEAD W OR WO CONTRAST  Result Date: 06/27/2019 CLINICAL DATA:  Stroke, follow up EXAM: CT ANGIOGRAPHY HEAD AND NECK TECHNIQUE: Multidetector CT imaging of the head and neck was performed using the  standard protocol during bolus administration of intravenous contrast. Multiplanar CT image reconstructions and MIPs were obtained to evaluate the vascular anatomy. Carotid stenosis measurements (when applicable) are obtained utilizing NASCET criteria, using the distal internal carotid diameter as the denominator.  CONTRAST:  19mL OMNIPAQUE IOHEXOL 350 MG/ML SOLN COMPARISON:  MRI of the brain June 26, 2019. FINDINGS: CT HEAD FINDINGS Brain: No evidence of new acute infarction, hemorrhage, hydrocephalus, extra-axial collection or mass lesion/mass effect. Small hypodensities in the left lentiform nucleus and thalamus corresponds to the recent infarct seen on prior MRI. Old lacunar infarcts are noted in the right basal ganglia and thalamus. Prominent confluent hypodensity of the white matter of the cerebral hemispheres is most likely related to chronic small vessel ischemia. Vascular: Tree is Skull: Normal. Negative for fracture or focal lesion. Sinuses: Imaged portions are clear. Orbits: Bilateral eye surgery. Review of the MIP images confirms the above findings CTA NECK FINDINGS Aortic arch: Common origin of the innominate and left common carotid artery. Imaged portion shows no evidence of aneurysm or dissection. Calcified plaques are seen in the aortic arch and at the origin of the major neck arteries. No significant stenosis of the major arch vessel origins. Prominent tortuosity of the major neck arteries may be related to longstanding hypertension. Right carotid system: Mixed density plaque is noted in the right carotid bifurcation, without hemodynamically significant stenosis. Left carotid system: Mixed density plaque is noted in the left carotid bifurcation, without hemodynamically significant stenosis Vertebral arteries: Mixed density plaque is noted at the origin of the right vertebral artery with approximately 50% stenosis. The left vertebral artery is dominant and its origin is widely patent. There is increased tortuosity and mild luminal irregularity of the cervical vertebral arteries without stenosis. Skeleton: Degenerative changes of the cervical spine are noted with prominent facet degeneration at C3-4 and C4-5. Other neck: 1.1 mm hypodense left thyroid lobe nodule. Upper chest: Negative Review of the MIP  images confirms the above findings CTA HEAD FINDINGS Anterior circulation: Calcified plaques are seen in the bilateral carotid siphons extending to the supraclinoid segment on the right side where there is approximately 40% stenosis at the level of the terminus. No significant stenosis of the intracranial left ICA. There is early bifurcation of the left MCA with mild to moderate stenosis at the origin of the superior division branch. The A1 segment of the right ACA is hypoplastic with a dominant left A1/ACA predominantly supplying both A2 segments. Mild luminal irregularity is noted throughout the bilateral MCA and ACA vascular trees, suggestive of intracranial atherosclerotic disease. A prominent right posterior communicating artery seen. Posterior circulation: The left vertebral artery is dominant. The basilar artery has normal course and caliber. The P1 segment of the right posterior cerebral artery is hypoplastic with most arterial flow coming from prominent posterior communicating artery (fetal PCA). Short segment of severe stenosis is noted at the P2 segment of the left posterior cerebral artery. There is also diffuse luminal irregularity throughout the bilateral PCA vascular trees, consistent with intracranial atherosclerotic disease. Venous sinuses: Minimally opacified given contrast timing. Anatomic variants: Right fetal PCA. Hypoplastic right A1/ACA with dominant left A1/ACA. Review of the MIP images confirms the above findings IMPRESSION: 1. Mixed density plaque in both carotid bulbs without hemodynamically significant stenosis. 2. A 50% stenosis at the origin of the right vertebral artery. 3. Short segment severe stenosis at the P2 segment of the left posterior cerebral artery. 4. Mild to moderate stenosis at the origin of the superior division left  MCA branch. 5. A 40% stenosis of the supraclinoid right ICA. 6. Diffuse luminal irregularity throughout the bilateral MCA, ACA and PCA vascular trees,  consistent with intracranial atherosclerotic disease. Aortic Atherosclerosis (ICD10-I70.0). Electronically Signed   By: Pedro Earls M.D.   On: 06/27/2019 13:12   CT ANGIO NECK W OR WO CONTRAST  Result Date: 06/27/2019 CLINICAL DATA:  Stroke, follow up EXAM: CT ANGIOGRAPHY HEAD AND NECK TECHNIQUE: Multidetector CT imaging of the head and neck was performed using the standard protocol during bolus administration of intravenous contrast. Multiplanar CT image reconstructions and MIPs were obtained to evaluate the vascular anatomy. Carotid stenosis measurements (when applicable) are obtained utilizing NASCET criteria, using the distal internal carotid diameter as the denominator. CONTRAST:  32mL OMNIPAQUE IOHEXOL 350 MG/ML SOLN COMPARISON:  MRI of the brain June 26, 2019. FINDINGS: CT HEAD FINDINGS Brain: No evidence of new acute infarction, hemorrhage, hydrocephalus, extra-axial collection or mass lesion/mass effect. Small hypodensities in the left lentiform nucleus and thalamus corresponds to the recent infarct seen on prior MRI. Old lacunar infarcts are noted in the right basal ganglia and thalamus. Prominent confluent hypodensity of the white matter of the cerebral hemispheres is most likely related to chronic small vessel ischemia. Vascular: Tree is Skull: Normal. Negative for fracture or focal lesion. Sinuses: Imaged portions are clear. Orbits: Bilateral eye surgery. Review of the MIP images confirms the above findings CTA NECK FINDINGS Aortic arch: Common origin of the innominate and left common carotid artery. Imaged portion shows no evidence of aneurysm or dissection. Calcified plaques are seen in the aortic arch and at the origin of the major neck arteries. No significant stenosis of the major arch vessel origins. Prominent tortuosity of the major neck arteries may be related to longstanding hypertension. Right carotid system: Mixed density plaque is noted in the right carotid  bifurcation, without hemodynamically significant stenosis. Left carotid system: Mixed density plaque is noted in the left carotid bifurcation, without hemodynamically significant stenosis Vertebral arteries: Mixed density plaque is noted at the origin of the right vertebral artery with approximately 50% stenosis. The left vertebral artery is dominant and its origin is widely patent. There is increased tortuosity and mild luminal irregularity of the cervical vertebral arteries without stenosis. Skeleton: Degenerative changes of the cervical spine are noted with prominent facet degeneration at C3-4 and C4-5. Other neck: 1.1 mm hypodense left thyroid lobe nodule. Upper chest: Negative Review of the MIP images confirms the above findings CTA HEAD FINDINGS Anterior circulation: Calcified plaques are seen in the bilateral carotid siphons extending to the supraclinoid segment on the right side where there is approximately 40% stenosis at the level of the terminus. No significant stenosis of the intracranial left ICA. There is early bifurcation of the left MCA with mild to moderate stenosis at the origin of the superior division branch. The A1 segment of the right ACA is hypoplastic with a dominant left A1/ACA predominantly supplying both A2 segments. Mild luminal irregularity is noted throughout the bilateral MCA and ACA vascular trees, suggestive of intracranial atherosclerotic disease. A prominent right posterior communicating artery seen. Posterior circulation: The left vertebral artery is dominant. The basilar artery has normal course and caliber. The P1 segment of the right posterior cerebral artery is hypoplastic with most arterial flow coming from prominent posterior communicating artery (fetal PCA). Short segment of severe stenosis is noted at the P2 segment of the left posterior cerebral artery. There is also diffuse luminal irregularity throughout the bilateral PCA vascular trees,  consistent with intracranial  atherosclerotic disease. Venous sinuses: Minimally opacified given contrast timing. Anatomic variants: Right fetal PCA. Hypoplastic right A1/ACA with dominant left A1/ACA. Review of the MIP images confirms the above findings IMPRESSION: 1. Mixed density plaque in both carotid bulbs without hemodynamically significant stenosis. 2. A 50% stenosis at the origin of the right vertebral artery. 3. Short segment severe stenosis at the P2 segment of the left posterior cerebral artery. 4. Mild to moderate stenosis at the origin of the superior division left MCA branch. 5. A 40% stenosis of the supraclinoid right ICA. 6. Diffuse luminal irregularity throughout the bilateral MCA, ACA and PCA vascular trees, consistent with intracranial atherosclerotic disease. Aortic Atherosclerosis (ICD10-I70.0). Electronically Signed   By: Pedro Earls M.D.   On: 06/27/2019 13:12   ECHOCARDIOGRAM COMPLETE  Result Date: 06/27/2019    ECHOCARDIOGRAM REPORT   Patient Name:   [name] Date of Exam: 06/27/2019 Medical Rec #:  [id number]        Height:       58.0 in Accession #:    [id number]       Weight:       153.0 lb Date of Birth:  1925-08-20        BSA:          1.625 m Patient Age:    87 years         BP:           183/96 mmHg Patient Gender: F                HR:           75 bpm. Exam Location:  Inpatient Procedure: 2D Echo, Cardiac Doppler and Color Doppler Indications:    Stroke 434.91  History:        Patient has prior history of Echocardiogram examinations, most                 recent 12/07/2017. CHF, Stroke; Risk Factors:Hypertension,                 Diabetes, Dyslipidemia and Non-Smoker. DOE. GERD.  Sonographer:    Vickie Epley RDCS Referring Phys: [place]  1. Left ventricular ejection fraction, by estimation, is 60 to 65%. The left ventricle has normal function. The left ventricle has no regional wall motion abnormalities.LV diastolic filling could not be assessed due to underlying  atrial fibrillation.  2. Right ventricular systolic function is normal. The right ventricular size is normal.  3. The mitral valve is degenerative. No evidence of mitral valve regurgitation. No evidence of mitral stenosis.  4. The aortic valve is tricuspid. Aortic valve regurgitation is not visualized. Mild to moderate aortic valve sclerosis/calcification is present, without any evidence of aortic stenosis.  5. The inferior vena cava is normal in size with greater than 50% respiratory variability, suggesting right atrial pressure of 3 mmHg.  6. Left atrial size was mild to moderately dilated. FINDINGS  Left Ventricle: Left ventricular ejection fraction, by estimation, is 60 to 65%. The left ventricle has normal function. The left ventricle has no regional wall motion abnormalities. The left ventricular internal cavity size was normal in size. There is  no left ventricular hypertrophy. LC diastolic filling could not be assessed due to underlying atrial fibrillation. Elevated left ventricular end-diastolic pressure. Right Ventricle: The right ventricular size is normal. No increase in right ventricular wall thickness. Right ventricular systolic function is normal. Left Atrium: Left atrial size  was mild to moderately dilated. Right Atrium: Right atrial size was normal in size. Pericardium: There is no evidence of pericardial effusion. Mitral Valve: The mitral valve is degenerative in appearance. There is mild calcification of the anterior mitral valve leaflet(s). Normal mobility of the mitral valve leaflets. No evidence of mitral valve regurgitation. No evidence of mitral valve stenosis. Tricuspid Valve: The tricuspid valve is normal in structure. Tricuspid valve regurgitation is trivial. No evidence of tricuspid stenosis. Aortic Valve: The aortic valve is tricuspid. . There is moderate thickening and mild calcification of the aortic valve. Aortic valve regurgitation is not visualized. Mild to moderate aortic valve  sclerosis/calcification is present, without any evidence of aortic stenosis. There is moderate thickening of the aortic valve. There is mild calcification of the aortic valve. Pulmonic Valve: The pulmonic valve was normal in structure. Pulmonic valve regurgitation is not visualized. No evidence of pulmonic stenosis. Aorta: The aortic root is normal in size and structure. Venous: The inferior vena cava is normal in size with greater than 50% respiratory variability, suggesting right atrial pressure of 3 mmHg. IAS/Shunts: No atrial level shunt detected by color flow Doppler.  LEFT VENTRICLE PLAX 2D LVIDd:         5.20 cm  Diastology LVIDs:         3.40 cm  LV e' lateral:   8.84 cm/s LV PW:         0.90 cm  LV E/e' lateral: 14.6 LV IVS:        0.90 cm  LV e' medial:    6.08 cm/s LVOT diam:     1.70 cm  LV E/e' medial:  21.2 LV SV:         55.16 ml LV SV Index:   33.94 LVOT Area:     2.27 cm  RIGHT VENTRICLE RV S prime:     12.30 cm/s TAPSE (M-mode): 1.5 cm LEFT ATRIUM             Index LA diam:        5.20 cm 3.20 cm/m LA Vol (A2C):   58.2 ml 35.81 ml/m LA Vol (A4C):   53.3 ml 32.80 ml/m LA Biplane Vol: 56.4 ml 34.70 ml/m  AORTIC VALVE LVOT Vmax:   104.00 cm/s LVOT Vmean:  80.000 cm/s LVOT VTI:    0.243 m  AORTA Ao Root diam: 3.00 cm MITRAL VALVE MV Area (PHT): 3.17 cm     SHUNTS MV Decel Time: 239 msec     Systemic VTI:  0.24 m MV E velocity: 129.00 cm/s  Systemic Diam: 1.70 cm MV A velocity: 38.30 cm/s MV E/A ratio:  3.37 Fransico Him MD Electronically signed by Fransico Him MD Signature Date/Time: 06/27/2019/12:22:29 PM    Final      Medical Consultants:    None.  Anti-Infectives:   None  Subjective:    [name] she has no new complaints feels great.  Objective:    Vitals:   06/29/19 0010 06/29/19 0339 06/29/19 0400 06/29/19 0725  BP: (!) 149/63 (!) 202/94 (!) 194/86 (!) 185/66  Pulse: 65 62  76  Resp: 17 16  18   Temp: 98.1 F (36.7 C) 98.3 F (36.8 C)  99.1 F (37.3 C)    TempSrc:  Oral  Oral  SpO2: 94% 94%  94%  Weight:      Height:       SpO2: 94 %   Intake/Output Summary (Last 24 hours) at 06/29/2019 1024 Last data filed at  06/28/2019 1745 Gross per 24 hour  Intake 1167.33 ml  Output --  Net 1167.33 ml   Filed Weights   06/26/19 1441 06/26/19 2130  Weight: 68 kg 69.4 kg    Exam: General exam: In no acute distress. Respiratory system: Good air movement and clear to auscultation. Cardiovascular system: S1 & S2 heard, RRR. No JVD. Gastrointestinal system: Abdomen is nondistended, soft and nontender.  Extremities: No pedal edema. Skin: No rashes, lesions or ulcers Psychiatry: Judgement and insight appear normal. Mood & affect appropriate.    Data Reviewed:    Labs: Basic Metabolic Panel: Recent Labs  Lab 06/22/19 1300 06/22/19 1300 06/26/19 1556 06/26/19 1556 06/27/19 0148 06/27/19 0148 06/28/19 0410 06/29/19 0633  NA 143  --  138  --  138  --  138 135  K 4.1   < > 3.2*   < > 3.3*   < > 2.8* 3.6  CL 99  --  97*  --  104  --  100 102  CO2 29  --  31  --  25  --  26 24  GLUCOSE 91  --  165*  --  129*  --  85 82  BUN 21  --  18  --  11  --  7* 11  CREATININE 1.00  --  0.95  --  0.71  --  0.66 0.62  CALCIUM 9.5  --  9.2  --  8.5*  --  8.6* 8.6*  MG  --   --   --   --  1.6*  --  1.9 2.0   < > = values in this interval not displayed.   GFR Estimated Creatinine Clearance: 37.8 mL/min (by C-G formula based on SCr of 0.62 mg/dL). Liver Function Tests: Recent Labs  Lab 06/22/19 1300 06/26/19 1556 06/27/19 0148  AST 24 24 21   ALT 18 22 17   ALKPHOS 69 55 46  BILITOT 0.5 0.8 0.7  PROT 6.7 7.1 6.1*  ALBUMIN 4.2 3.9 3.2*   No results for input(s): LIPASE, AMYLASE in the last 168 hours. No results for input(s): AMMONIA in the last 168 hours. Coagulation profile No results for input(s): INR, PROTIME in the last 168 hours. COVID-19 Labs  No results for input(s): DDIMER, FERRITIN, LDH, CRP in the last 72 hours.  Lab Results   Component Value Date   [place] NEGATIVE 06/26/2019    CBC: Recent Labs  Lab 06/22/19 1300 06/26/19 1556 06/28/19 0410  WBC 7.1 8.9 10.8*  NEUTROABS 5.1 7.7 9.1*  HGB 14.5 15.2* 14.8  HCT 44.8 48.8* 46.6*  MCV 82 83.1 80.5  PLT 249 229 208   Cardiac Enzymes: No results for input(s): CKTOTAL, CKMB, CKMBINDEX, TROPONINI in the last 168 hours. BNP (last 3 results) No results for input(s): PROBNP in the last 8760 hours. CBG: Recent Labs  Lab 06/28/19 0822 06/28/19 1437 06/28/19 2121 06/29/19 0613 06/29/19 0720  GLUCAP 88 92 115* 65* 130*   D-Dimer: No results for input(s): DDIMER in the last 72 hours. Hgb A1c: Recent Labs    06/27/19 0148  HGBA1C 6.3*   Lipid Profile: Recent Labs    06/27/19 0148  CHOL 240*  HDL 43  LDLCALC 179*  TRIG 88  CHOLHDL 5.6   Thyroid function studies: Recent Labs    06/26/19 1556  TSH 0.683   Anemia work up: Recent Labs    06/26/19 1556  VITAMINB12 808  FOLATE 19.7   Sepsis Labs: Recent Labs  Lab 06/22/19 1300  06/26/19 1556 06/28/19 0410  WBC 7.1 8.9 10.8*   Microbiology Recent Results (from the past 240 hour(s))  SARS CORONAVIRUS 2 (TAT 6-24 HRS) Nasopharyngeal Nasopharyngeal Swab     Status: None   Collection Time: 06/26/19  3:49 PM   Specimen: Nasopharyngeal Swab  Result Value Ref Range Status   SARS Coronavirus 2 NEGATIVE NEGATIVE Final    Comment: (NOTE) SARS-CoV-2 target nucleic acids are NOT DETECTED. The SARS-CoV-2 RNA is generally detectable in upper and lower respiratory specimens during the acute phase of infection. Negative results do not preclude SARS-CoV-2 infection, do not rule out co-infections with other pathogens, and should not be used as the sole basis for treatment or other patient management decisions. Negative results must be combined with clinical observations, patient history, and epidemiological information. The expected result is Negative. Fact Sheet for  Patients: SugarRoll.be Fact Sheet for Healthcare Providers: https://www.woods-mathews.com/ This test is not yet approved or cleared by the [place] FDA and  has been authorized for detection and/or diagnosis of SARS-CoV-2 by FDA under an Emergency Use Authorization (EUA). This EUA will remain  in effect (meaning this test can be used) for the duration of the COVID-19 declaration under Section 56 4(b)(1) of the Act, 21 U.S.C. section 360bbb-3(b)(1), unless the authorization is terminated or revoked sooner. Performed at [facility], [place] [address], [place], [place] [address]      Medications:   . apixaban  5 mg Oral BID  . aspirin EC  81 mg Oral Daily  . atorvastatin  80 mg Oral q1800  . cholecalciferol  2,000 Units Oral Daily  . DULoxetine  20 mg Oral Daily  . hydrALAZINE  25 mg Oral Q8H  . insulin aspart  0-9 Units Subcutaneous TID WC  . insulin glargine  25 Units Subcutaneous QHS  . pantoprazole  40 mg Oral Daily  . rOPINIRole  0.25 mg Oral TID   Continuous Infusions: . magnesium sulfate bolus IVPB Stopped (06/27/19 1332)      LOS: 2 days   Charlynne Cousins  [facility]  06/29/2019, 10:24 AM

## 2019-06-30 DIAGNOSIS — I63312 Cerebral infarction due to thrombosis of left middle cerebral artery: Secondary | ICD-10-CM

## 2019-06-30 DIAGNOSIS — E1142 Type 2 diabetes mellitus with diabetic polyneuropathy: Secondary | ICD-10-CM

## 2019-06-30 LAB — GLUCOSE, CAPILLARY
Glucose-Capillary: 122 mg/dL — ABNORMAL HIGH (ref 70–99)
Glucose-Capillary: 87 mg/dL (ref 70–99)
Glucose-Capillary: 171 mg/dL — ABNORMAL HIGH (ref 70–99)
Glucose-Capillary: 103 mg/dL — ABNORMAL HIGH (ref 70–99)
Glucose-Capillary: 179 mg/dL — ABNORMAL HIGH (ref 70–99)

## 2019-06-30 LAB — SARS CORONAVIRUS 2 (TAT 6-24 HRS): SARS Coronavirus 2: NEGATIVE

## 2019-06-30 MED ORDER — HYDRALAZINE HCL 50 MG PO TABS
50.0000 mg | ORAL_TABLET | Freq: Two times a day (BID) | ORAL | Status: DC
  Administered 2019-06-30 – 2019-07-01 (×3): 50 mg via ORAL
  Filled 2019-06-30 (×3): qty 1

## 2019-06-30 MED ORDER — IRBESARTAN 150 MG PO TABS: 75.0000 mg | ORAL_TABLET | Freq: Every day | ORAL | Status: DC

## 2019-06-30 MED ORDER — HYDRALAZINE HCL 20 MG/ML IJ SOLN
INTRAMUSCULAR | Status: AC
  Administered 2019-06-30: 20 mg
  Filled 2019-06-30: qty 1

## 2019-06-30 NOTE — Progress Notes (Signed)
[facility] PROGRESS NOTE    Progress Note  [name]  [id number] DOB: [birth year] DOA: 06/26/2019 PCP: Baruch Gouty, FNP     Brief Narrative:   [name] is an 86 y.o. female past medical history of type 2 diabetes mellitus, hyperlipidemia, hypertension frequent UTIs CVAs presents to the ED with altered mental status.  Per the son the patient has been gradually declining over the last 2 months with inability to perform ADLs and worsening arthritis and decreased mobility, in the ED she was CT of the head was negative however her MRI was positive for CVA.  Assessment/Plan:   CVA (cerebral vascular accident) ([place]) HgbA1c 6.3, fasting lipid panel LDL 179 MRI, MRA of the brain without contrast inferior left basal ganglia and thalamic stroke Physical therapy and occupational therapy evaluated the patient recommended skilled nursing facility, speech saw the patient recommended a regular diet. 2D echo showed an EF of 60% no embolus, patient atrial fibrillation. Neurology was consulted they recommended to continue Eliquis they added aspirin, continue Lipitor for her hyperlipidemia. We will allow permissive hypertension to 220/100, awaiting skilled nursing facility placement. Patient is stable for discharge.  Acute metabolic encephalopathy: Likely secondary to CVA has gradually improved. Continue supportive care.  Chronic atrial fibrillation: Continue Eliquis.  Essential hypertension: Allow permissive hypertension, gradually will normalize within a few days. Will need to follow-up with PCP as an outpatient.  Hyperlipidemia associated with type 2 diabetes mellitus ([place]) Previously on statin we have restarted Lipitor 80.  Diabetic polyneuropathy associated with type 2 diabetes mellitus ([place]) With an A1c of 6.3, continue long-acting insulin plus sliding scale.  Chronic diastolic CHF (congestive heart failure) (HCC) On current regimen.  Appears to be  compensated.  Hypokalemia: Replete orally now improved.  Recurrent depression: Continue Cymbalta.  Chronic pain of right knee Continue current regimen.    DVT prophylaxis: eluquis Family Communication:none Disposition Plan/Barrier to D/C: She came from skilled nursing facility physical therapy reevaluated the patient the recommended back to skilled nursing facility, patient is stable for discharge awaiting placement.    Code Status:     Code Status Orders  (From admission, onward)         Start     Ordered   06/27/19 0908  Do not attempt resuscitation (DNR)  Continuous    Question Answer Comment  In the event of cardiac or respiratory ARREST Do not call a "code blue"   In the event of cardiac or respiratory ARREST Do not perform Intubation, CPR, defibrillation or ACLS   In the event of cardiac or respiratory ARREST Use medication by any route, position, wound care, and other measures to relive pain and suffering. May use oxygen, suction and manual treatment of airway obstruction as needed for comfort.      06/27/19 0911        Code Status History    Date Active Date Inactive Code Status Order ID Comments User Context   06/26/2019 2109 06/27/2019 0911 DNR [id number]  Albertine Patricia, MD Inpatient   06/26/2019 2010 06/26/2019 2109 DNR [id number]  Elgergawy, Silver Huguenin, MD ED   12/06/2017 1400 12/10/2017 1940 DNR [id number]  Rodena Goldmann, DO ED   Advance Care Planning Activity        IV Access:    Peripheral IV   Procedures and diagnostic studies:   No results found.   Medical Consultants:    None.  Anti-Infectives:   None  Subjective:    [name]  has no new complaints she feels great.  Objective:    Vitals:   06/29/19 1944 06/29/19 2348 06/30/19 0405 06/30/19 0759  BP: (!) 161/77 (!) 142/72 (!) 155/92 (!) 170/82  Pulse: 78 82 83 79  Resp: 19 17 19  (!) 22  Temp: 97.6 F (36.4 C) 98.7 F (37.1 C) 98.1 F (36.7 C) 98.5 F (36.9 C)   TempSrc: Oral Oral Oral Oral  SpO2: 92% 96% 95% 94%  Weight:      Height:       SpO2: 94 %   Intake/Output Summary (Last 24 hours) at 06/30/2019 0904 Last data filed at 06/30/2019 0604 Gross per 24 hour  Intake 390 ml  Output 300 ml  Net 90 ml   Filed Weights   06/26/19 1441 06/26/19 2130  Weight: 68 kg 69.4 kg    Exam: General exam: In no acute distress. Respiratory system: Good air movement and clear to auscultation. Cardiovascular system: S1 & S2 heard, RRR. No JVD. Gastrointestinal system: Abdomen is nondistended, soft and nontender.  Extremities: No pedal edema. Skin: No rashes, lesions or ulcers  Data Reviewed:    Labs: Basic Metabolic Panel: Recent Labs  Lab 06/26/19 1556 06/26/19 1556 06/27/19 0148 06/27/19 0148 06/28/19 0410 06/29/19 0633  NA 138  --  138  --  138 135  K 3.2*   < > 3.3*   < > 2.8* 3.6  CL 97*  --  104  --  100 102  CO2 31  --  25  --  26 24  GLUCOSE 165*  --  129*  --  85 82  BUN 18  --  11  --  7* 11  CREATININE 0.95  --  0.71  --  0.66 0.62  CALCIUM 9.2  --  8.5*  --  8.6* 8.6*  MG  --   --  1.6*  --  1.9 2.0   < > = values in this interval not displayed.   GFR Estimated Creatinine Clearance: 37.8 mL/min (by C-G formula based on SCr of 0.62 mg/dL). Liver Function Tests: Recent Labs  Lab 06/26/19 1556 06/27/19 0148  AST 24 21  ALT 22 17  ALKPHOS 55 46  BILITOT 0.8 0.7  PROT 7.1 6.1*  ALBUMIN 3.9 3.2*   No results for input(s): LIPASE, AMYLASE in the last 168 hours. No results for input(s): AMMONIA in the last 168 hours. Coagulation profile No results for input(s): INR, PROTIME in the last 168 hours. COVID-19 Labs  No results for input(s): DDIMER, FERRITIN, LDH, CRP in the last 72 hours.  Lab Results  Component Value Date   [place] NEGATIVE 06/26/2019    CBC: Recent Labs  Lab 06/26/19 1556 06/28/19 0410  WBC 8.9 10.8*  NEUTROABS 7.7 9.1*  HGB 15.2* 14.8  HCT 48.8* 46.6*  MCV 83.1 80.5  PLT 229 208    Cardiac Enzymes: No results for input(s): CKTOTAL, CKMB, CKMBINDEX, TROPONINI in the last 168 hours. BNP (last 3 results) No results for input(s): PROBNP in the last 8760 hours. CBG: Recent Labs  Lab 06/29/19 0720 06/29/19 1139 06/29/19 1529 06/29/19 2100 06/30/19 0627  GLUCAP 130* 119* 81 148* 87   D-Dimer: No results for input(s): DDIMER in the last 72 hours. Hgb A1c: No results for input(s): HGBA1C in the last 72 hours. Lipid Profile: No results for input(s): CHOL, HDL, LDLCALC, TRIG, CHOLHDL, LDLDIRECT in the last 72 hours. Thyroid function studies: No results for input(s): TSH, T4TOTAL, T3FREE, THYROIDAB in the last  72 hours.  Invalid input(s): FREET3 Anemia work up: No results for input(s): VITAMINB12, FOLATE, FERRITIN, TIBC, IRON, RETICCTPCT in the last 72 hours. Sepsis Labs: Recent Labs  Lab 06/26/19 1556 06/28/19 0410  WBC 8.9 10.8*   Microbiology Recent Results (from the past 240 hour(s))  SARS CORONAVIRUS 2 (TAT 6-24 HRS) Nasopharyngeal Nasopharyngeal Swab     Status: None   Collection Time: 06/26/19  3:49 PM   Specimen: Nasopharyngeal Swab  Result Value Ref Range Status   SARS Coronavirus 2 NEGATIVE NEGATIVE Final    Comment: (NOTE) SARS-CoV-2 target nucleic acids are NOT DETECTED. The SARS-CoV-2 RNA is generally detectable in upper and lower respiratory specimens during the acute phase of infection. Negative results do not preclude SARS-CoV-2 infection, do not rule out co-infections with other pathogens, and should not be used as the sole basis for treatment or other patient management decisions. Negative results must be combined with clinical observations, patient history, and epidemiological information. The expected result is Negative. Fact Sheet for Patients: SugarRoll.be Fact Sheet for Healthcare Providers: https://www.woods-mathews.com/ This test is not yet approved or cleared by the [place] FDA  and  has been authorized for detection and/or diagnosis of SARS-CoV-2 by FDA under an Emergency Use Authorization (EUA). This EUA will remain  in effect (meaning this test can be used) for the duration of the COVID-19 declaration under Section 56 4(b)(1) of the Act, 21 U.S.C. section 360bbb-3(b)(1), unless the authorization is terminated or revoked sooner. Performed at [facility], [place] [address] [place], [place] [address]      Medications:   . apixaban  5 mg Oral BID  . aspirin EC  81 mg Oral Daily  . atorvastatin  80 mg Oral q1800  . cholecalciferol  2,000 Units Oral Daily  . DULoxetine  20 mg Oral Daily  . hydrALAZINE  25 mg Oral Q8H  . insulin aspart  0-9 Units Subcutaneous TID WC  . insulin glargine  25 Units Subcutaneous QHS  . pantoprazole  40 mg Oral Daily  . rOPINIRole  0.25 mg Oral TID   Continuous Infusions: . magnesium sulfate bolus IVPB Stopped (06/27/19 1332)      LOS: 3 days   Charlynne Cousins  [facility]  06/30/2019, 9:04 AM

## 2019-06-30 NOTE — TOC Progression Note (Signed)
Transition of Care [facility]) - Progression Note    Patient Details  Name: [name] MRN: [id number] Date of Birth: [birth year]  Transition of Care [facility]) CM/SW [place], [place] Phone Number: 06/30/2019, 10:42 AM  Clinical Narrative:   CSW received call from patient's son, [name], that the family would like to choose [facility] for rehab. [name] also indicated that his brother had questions, sent to [place] via email. CSW responded to questions that the family had about placement and long term expectations. CSW noting in MD note that patient is stable, asked for COVID test for discharge. CSW contacted [employer] to initiate insurance authorization for SNF.     Expected Discharge Plan: [place] Barriers to Discharge: Continued Medical Work up, Ship broker  Expected Discharge Plan and Services Expected Discharge Plan: [place] arrangements for the past 2 months: Single Family Home                                       Social Determinants of Health (SDOH) Interventions    Readmission Risk Interventions No flowsheet data found.

## 2019-06-30 NOTE — Progress Notes (Signed)
Physical Therapy Treatment Patient Details Name: [name] MRN: [id number] DOB: 04-18-1926 Today's Date: 06/30/2019    History of Present Illness Pt is a 86 y.o. female presenting with AMS with decrease function over last 2 months. Son reporting progressive confusion and lethargy over last week. MRI showing acute subcentimeter infarction at the inferior basal ganglia on the left. PMH including DM type 2, dyslipidemia, HTN, GERD, and neuropathy.     PT Comments    Pt limited this session secondary to persistent and worsening dizziness with activity. BP was assessed at end of session (114/50 mmHg). Pt also much more confused this session than yesterday. Attempted assisting pt with calling her son at end of session, but no answer. Pt would continue to benefit from skilled physical therapy services at this time while admitted and after d/c to address the below listed limitations in order to improve overall safety and independence with functional mobility.    Follow Up Recommendations  SNF     Equipment Recommendations  Rolling walker with 5" wheels;3in1 (PT)    Recommendations for Other Services       Precautions / Restrictions Precautions Precautions: Fall Restrictions Weight Bearing Restrictions: No    Mobility  Bed Mobility               General bed mobility comments: pt OOB in recliner chair upon arrival  Transfers Overall transfer level: Needs assistance Equipment used: Rolling walker (2 wheeled) Transfers: Sit to/from Stand Sit to Stand: Mod assist         General transfer comment: cueing for safe hand placement, assistance needed to power into standing from recliner chair  Ambulation/Gait Ambulation/Gait assistance: Min assist Gait Distance (Feet): 10 Feet Assistive device: Rolling walker (2 wheeled) Gait Pattern/deviations: Decreased step length - right;Decreased step length - left;Shuffle;Decreased dorsiflexion - right;Decreased dorsiflexion -  left;Narrow base of support Gait velocity: decreased   General Gait Details: frequent cueing needed for improved step length on R; pt with uncoordinated steps and poor balance requiring frequent min A and assistance needed with RW management; limited this session secondary to persistent and worsening dizziness   Stairs             Wheelchair Mobility    Modified [name] (Stroke Patients Only) Modified [name] (Stroke Patients Only) Pre-Morbid [name] Score: No symptoms Modified [name]: Moderately severe disability     Balance Overall balance assessment: Needs assistance Sitting-balance support: No upper extremity supported;Feet supported Sitting balance-Leahy Scale: Fair     Standing balance support: Bilateral upper extremity supported;During functional activity Standing balance-Leahy Scale: Poor                              Cognition Arousal/Alertness: Awake/alert Behavior During Therapy: WFL for tasks assessed/performed Overall Cognitive Status: Impaired/Different from baseline Area of Impairment: Attention;Memory;Following commands;Safety/judgement;Problem solving;Awareness                   Current Attention Level: Sustained Memory: Decreased short-term memory Following Commands: Follows one step commands with increased time;Follows multi-step commands inconsistently Safety/Judgement: Decreased awareness of deficits Awareness: Intellectual Problem Solving: Slow processing;Difficulty sequencing;Requires verbal cues        Exercises      General Comments        Pertinent Vitals/Pain Pain Assessment: No/denies pain    Home Living                      Prior  Function            PT Goals (current goals can now be found in the care plan section) Acute Rehab PT Goals PT Goal Formulation: With patient Time For Goal Achievement: 07/11/19 Potential to Achieve Goals: Good Progress towards PT goals: Progressing toward goals     Frequency    Min 3X/week      PT Plan Current plan remains appropriate    Co-evaluation              AM-PAC PT "6 Clicks" Mobility   Outcome Measure  Help needed turning from your back to your side while in a flat bed without using bedrails?: A Little Help needed moving from lying on your back to sitting on the side of a flat bed without using bedrails?: A Little Help needed moving to and from a bed to a chair (including a wheelchair)?: A Lot Help needed standing up from a chair using your arms (e.g., wheelchair or bedside chair)?: A Little Help needed to walk in hospital room?: A Lot Help needed climbing 3-5 steps with a railing? : A Lot 6 Click Score: 15    End of Session Equipment Utilized During Treatment: Gait belt Activity Tolerance: Patient tolerated treatment well Patient left: in chair;with call bell/phone within reach;with chair alarm set Nurse Communication: Mobility status PT Visit Diagnosis: Unsteadiness on feet (R26.81);Other abnormalities of gait and mobility (R26.89);Other symptoms and signs involving the nervous system (R29.898)     Time: [id number] PT Time Calculation (min) (ACUTE ONLY): 19 min  Charges:  $Therapeutic Activity: 8-22 mins                     Anastasio Champion, DPT  Acute Rehabilitation Services Pager [phone] Office [place] 06/30/2019, 12:32 PM

## 2019-06-30 NOTE — TOC Progression Note (Signed)
Transition of Care [facility]) - Progression Note    Patient Details  Name: [name] MRN: [id number] Date of Birth: 05-15-1925  Transition of Care [facility]) CM/SW [place], [place] Phone Number: 06/30/2019, 1:07 PM  Clinical Narrative:   CSW alerted by RN that patient refusing COVID swab. CSW went to speak with patient to explain why she needed it, and patient did not understand why she still needed to be tested even though she'd been vaccinated. Patient kept confusing the shot and the test during discussion, and then indicated that she was not going to rehab, either. Patient asked to speak with her son, [name]. CSW contacted [name] and asked him to call the patient, that she wasn't seeming to understand the need for the test and was also refusing SNF at this time. [name] indicated that the patient still seems confused today as she asked him earlier this morning to call his dad when his dad has been dead for some time. [name] called the patient and then indicated to [name] that the patient is now agreeable. CSW alerted RN.     Expected Discharge Plan: [place] Barriers to Discharge: Continued Medical Work up, Ship broker  Expected Discharge Plan and Services Expected Discharge Plan: [place] arrangements for the past 2 months: Single Family Home                                       Social Determinants of Health (SDOH) Interventions    Readmission Risk Interventions No flowsheet data found.

## 2019-06-30 NOTE — Progress Notes (Signed)
Daily Progress Note   Patient Name: [name]       Date: 06/30/2019 DOB: 04-May-1926  Age: 86 y.o. MRN#: [id number] Attending Physician: Charlynne Cousins, MD Primary Care Physician: Baruch Gouty, FNP Admit Date: 06/26/2019  Reason for Consultation/Follow-up: Establishing goals of care  Subjective: Patient sitting up in the recliner s/p PT session. Seems mor confused today compared to previous interactions. Able to follow commands. Pleasant behavior. No family at the bedside.   I called and spoke with patient's son/POA, [name]. Updates provided. Son reports goal is for patient to discharge to SNF for rehab. He remains hopeful patient will show some improvement and can return home but is also aware that patient may not show much more improvement or could further decline. He shares speaking with his mother earlier today and awareness of increased confusion. [name] reports patient called and asked to let his father know she was calling and to come see her again. Son acknowledges his father has been deceased for several years and is somewhat concern by his mother's statement and what it could potentially mean. He reports this is the first time she has made this type of statement regarding someone that is deceased and knows that her husband is no longer with them.   We discussed outpatient palliative support at SNF. Son verbalized agreement and understanding palliative would be essential in assisting with continued goals of care and providing guidance in decisions. Son is aware he may transition care to a more comfort/hospice approach at anytime by discussing with outpatient medical team.   All questions answered and support given.   Length of Stay: 3  Current Medications: Scheduled Meds:  .  apixaban  5 mg Oral BID  . aspirin EC  81 mg Oral Daily  . atorvastatin  80 mg Oral q1800  . cholecalciferol  2,000 Units Oral Daily  . DULoxetine  20 mg Oral Daily  . hydrALAZINE  50 mg Oral BID  . insulin aspart  0-9 Units Subcutaneous TID WC  . insulin glargine  25 Units Subcutaneous QHS  . pantoprazole  40 mg Oral Daily  . rOPINIRole  0.25 mg Oral TID    Continuous Infusions: . magnesium sulfate bolus IVPB Stopped (06/27/19 1332)    PRN Meds: [DISCONTINUED] acetaminophen **OR** [DISCONTINUED] acetaminophen (TYLENOL) oral liquid 160  mg/5 mL **OR** acetaminophen, acetaminophen **OR** [DISCONTINUED] acetaminophen, senna-docusate  Physical Exam    -awake, alert, pleasantly confused -RRR -diminished bilaterally -alert to self and place, follows commands       Vital Signs: BP (!) 132/57 (BP Location: Right Arm)   Pulse 87   Temp 98.8 F (37.1 C) (Oral)   Resp (!) 26   Ht 4\' 10"  (1.473 m)   Wt 69.4 kg   SpO2 97%   BMI 31.98 kg/m  SpO2: SpO2: 97 % O2 Device: O2 Device: Room Air O2 Flow Rate:    Intake/output summary:   Intake/Output Summary (Last 24 hours) at 06/30/2019 1343 Last data filed at 06/30/2019 0604 Gross per 24 hour  Intake 390 ml  Output 300 ml  Net 90 ml   LBM: Last BM Date: 06/29/19 Baseline Weight: Weight: 68 kg Most recent weight: Weight: 69.4 kg       Palliative Assessment/Data:PPS 30%     Patient Active Problem List   Diagnosis Date Noted  . Acute encephalopathy 06/27/2019  . Heat stroke 06/27/2019  . Hypokalemia 06/27/2019  . Fever, unspecified 06/27/2019  . Stroke ([place]) 06/27/2019  . Altered mental status 06/26/2019  . Right hip pain 06/22/2019  . Chronic left hip pain 05/25/2019  . Depression, recurrent ([place]) 05/25/2019  . Chronic anticoagulation 04/19/2019  . Leg cramps 03/04/2019  . Short-term memory loss 03/04/2019  . DOE (dyspnea on exertion) 03/04/2019  . Aortic atherosclerosis ([place]) 03/04/2019  . Physical deconditioning  01/24/2019  . History of recurrent UTIs 12/03/2018  . Chronic pain of right knee 10/08/2018  . Type 2 diabetes mellitus with diabetic neuropathy, unspecified ([place]) 08/12/2018  . Pulmonary vascular congestion   . Chronic diastolic CHF (congestive heart failure) ([place])   . Acute hypoxemic respiratory failure ([place]) 12/06/2017  . Permanent atrial fibrillation ([place]) 12/06/2017  . Trochanteric bursitis, right hip 07/16/2017  . Other intervertebral disc degeneration, lumbar region 07/16/2017  . Osteoporosis 11/04/2016  . Diabetic polyneuropathy associated with type 2 diabetes mellitus ([place]) 11/14/2014  . Fibromyalgia 02/11/2013  . CVA (cerebral vascular accident) ([place]) 08/01/2008  . Osteoarthritis 04/09/2007  . Hyperlipidemia associated with type 2 diabetes mellitus ([place]) 03/08/2007  . Hypertension associated with diabetes ([place]) 03/08/2007  . Pulmonary fibrosis ([place]) 03/08/2007  . GERD 03/08/2007  . BREAST CANCER, HX OF 03/08/2007    Palliative Care Assessment & Plan   Recommendations/Plan:  DNR/DNI  Continue with current plan of care per medical team  Son remains hopeful some improvement/stability but also prepared for the worst (no improvement/further decline).   Outpatient palliative support at SNF.   I will be off service after today. For urgent needs place call PMT team line (in [place]).   Goals of Care and Additional Recommendations:  Limitations on Scope of Treatment: Treat the treatable, DNR  Code Status:    Code Status Orders  (From admission, onward)         Start     Ordered   06/27/19 0908  Do not attempt resuscitation (DNR)  Continuous    Question Answer Comment  In the event of cardiac or respiratory ARREST Do not call a "code blue"   In the event of cardiac or respiratory ARREST Do not perform Intubation, CPR, defibrillation or ACLS   In the event of cardiac or respiratory ARREST Use medication by any route, position, wound care, and other measures to relive  pain and suffering. May use oxygen, suction and manual treatment of airway obstruction as needed for comfort.  06/27/19 0911        Code Status History    Date Active Date Inactive Code Status Order ID Comments User Context   06/26/2019 2109 06/27/2019 0911 DNR [id number]  Albertine Patricia, MD Inpatient   06/26/2019 2010 06/26/2019 2109 DNR [id number]  Albertine Patricia, MD ED   12/06/2017 1400 12/10/2017 1940 DNR [id number]  Rodena Goldmann, DO ED   Advance Care Planning Activity       Prognosis:  Guarded   Discharge Planning:  [place] for rehab with Palliative care service follow-up  Care plan was discussed with patient's son [name].   Thank you for allowing the Palliative Medicine Team to assist in the care of this patient.  Time Total: 35 min.   Visit consisted of counseling and education dealing with the complex and emotionally intense issues of symptom management and palliative care in the setting of serious and potentially life-threatening illness.Greater than 50%  of this time was spent counseling and coordinating care related to the above assessment and plan.  Alda Lea, AGPCNP-BC  Palliative Medicine Team [phone]  Please contact Palliative Medicine Team phone at [phone] for questions and concerns.

## 2019-07-01 DIAGNOSIS — M25559 Pain in unspecified hip: Secondary | ICD-10-CM | POA: Diagnosis not present

## 2019-07-01 DIAGNOSIS — E1169 Type 2 diabetes mellitus with other specified complication: Secondary | ICD-10-CM | POA: Diagnosis not present

## 2019-07-01 DIAGNOSIS — M8949 Other hypertrophic osteoarthropathy, multiple sites: Secondary | ICD-10-CM | POA: Diagnosis not present

## 2019-07-01 DIAGNOSIS — S0093XA Contusion of unspecified part of head, initial encounter: Secondary | ICD-10-CM | POA: Diagnosis not present

## 2019-07-01 DIAGNOSIS — M1990 Unspecified osteoarthritis, unspecified site: Secondary | ICD-10-CM | POA: Diagnosis not present

## 2019-07-01 DIAGNOSIS — W19XXXA Unspecified fall, initial encounter: Secondary | ICD-10-CM | POA: Diagnosis not present

## 2019-07-01 DIAGNOSIS — Z794 Long term (current) use of insulin: Secondary | ICD-10-CM | POA: Diagnosis not present

## 2019-07-01 DIAGNOSIS — S065X0A Traumatic subdural hemorrhage without loss of consciousness, initial encounter: Secondary | ICD-10-CM | POA: Diagnosis not present

## 2019-07-01 DIAGNOSIS — Z20822 Contact with and (suspected) exposure to covid-19: Secondary | ICD-10-CM | POA: Diagnosis not present

## 2019-07-01 DIAGNOSIS — Z8673 Personal history of transient ischemic attack (TIA), and cerebral infarction without residual deficits: Secondary | ICD-10-CM | POA: Diagnosis not present

## 2019-07-01 DIAGNOSIS — R0902 Hypoxemia: Secondary | ICD-10-CM | POA: Diagnosis not present

## 2019-07-01 DIAGNOSIS — I69398 Other sequelae of cerebral infarction: Secondary | ICD-10-CM | POA: Diagnosis not present

## 2019-07-01 DIAGNOSIS — M25522 Pain in left elbow: Secondary | ICD-10-CM | POA: Diagnosis not present

## 2019-07-01 DIAGNOSIS — Y92128 Other place in nursing home as the place of occurrence of the external cause: Secondary | ICD-10-CM | POA: Diagnosis not present

## 2019-07-01 DIAGNOSIS — I4821 Permanent atrial fibrillation: Secondary | ICD-10-CM | POA: Diagnosis not present

## 2019-07-01 DIAGNOSIS — I160 Hypertensive urgency: Secondary | ICD-10-CM | POA: Diagnosis not present

## 2019-07-01 DIAGNOSIS — I63312 Cerebral infarction due to thrombosis of left middle cerebral artery: Secondary | ICD-10-CM | POA: Diagnosis not present

## 2019-07-01 DIAGNOSIS — E876 Hypokalemia: Secondary | ICD-10-CM | POA: Diagnosis not present

## 2019-07-01 DIAGNOSIS — S065X9A Traumatic subdural hemorrhage with loss of consciousness of unspecified duration, initial encounter: Secondary | ICD-10-CM | POA: Diagnosis not present

## 2019-07-01 DIAGNOSIS — M79602 Pain in left arm: Secondary | ICD-10-CM | POA: Diagnosis not present

## 2019-07-01 DIAGNOSIS — Z03818 Encounter for observation for suspected exposure to other biological agents ruled out: Secondary | ICD-10-CM | POA: Diagnosis not present

## 2019-07-01 DIAGNOSIS — I10 Essential (primary) hypertension: Secondary | ICD-10-CM | POA: Diagnosis not present

## 2019-07-01 DIAGNOSIS — R079 Chest pain, unspecified: Secondary | ICD-10-CM | POA: Diagnosis not present

## 2019-07-01 DIAGNOSIS — S52125D Nondisplaced fracture of head of left radius, subsequent encounter for closed fracture with routine healing: Secondary | ICD-10-CM | POA: Diagnosis not present

## 2019-07-01 DIAGNOSIS — I161 Hypertensive emergency: Secondary | ICD-10-CM | POA: Diagnosis not present

## 2019-07-01 DIAGNOSIS — E1151 Type 2 diabetes mellitus with diabetic peripheral angiopathy without gangrene: Secondary | ICD-10-CM | POA: Diagnosis not present

## 2019-07-01 DIAGNOSIS — R4182 Altered mental status, unspecified: Secondary | ICD-10-CM | POA: Diagnosis present

## 2019-07-01 DIAGNOSIS — S199XXA Unspecified injury of neck, initial encounter: Secondary | ICD-10-CM | POA: Diagnosis not present

## 2019-07-01 DIAGNOSIS — M6281 Muscle weakness (generalized): Secondary | ICD-10-CM | POA: Diagnosis not present

## 2019-07-01 DIAGNOSIS — I493 Ventricular premature depolarization: Secondary | ICD-10-CM | POA: Diagnosis not present

## 2019-07-01 DIAGNOSIS — I639 Cerebral infarction, unspecified: Secondary | ICD-10-CM | POA: Diagnosis not present

## 2019-07-01 DIAGNOSIS — Z7901 Long term (current) use of anticoagulants: Secondary | ICD-10-CM | POA: Diagnosis not present

## 2019-07-01 DIAGNOSIS — I4891 Unspecified atrial fibrillation: Secondary | ICD-10-CM | POA: Diagnosis not present

## 2019-07-01 DIAGNOSIS — M797 Fibromyalgia: Secondary | ICD-10-CM | POA: Diagnosis not present

## 2019-07-01 DIAGNOSIS — S066X0A Traumatic subarachnoid hemorrhage without loss of consciousness, initial encounter: Secondary | ICD-10-CM | POA: Diagnosis not present

## 2019-07-01 DIAGNOSIS — S59912A Unspecified injury of left forearm, initial encounter: Secondary | ICD-10-CM | POA: Diagnosis not present

## 2019-07-01 DIAGNOSIS — R52 Pain, unspecified: Secondary | ICD-10-CM | POA: Diagnosis not present

## 2019-07-01 DIAGNOSIS — E1159 Type 2 diabetes mellitus with other circulatory complications: Secondary | ICD-10-CM | POA: Diagnosis not present

## 2019-07-01 DIAGNOSIS — I5032 Chronic diastolic (congestive) heart failure: Secondary | ICD-10-CM | POA: Diagnosis not present

## 2019-07-01 DIAGNOSIS — Z66 Do not resuscitate: Secondary | ICD-10-CM | POA: Diagnosis not present

## 2019-07-01 DIAGNOSIS — Z7982 Long term (current) use of aspirin: Secondary | ICD-10-CM | POA: Diagnosis not present

## 2019-07-01 DIAGNOSIS — G8929 Other chronic pain: Secondary | ICD-10-CM | POA: Diagnosis not present

## 2019-07-01 DIAGNOSIS — M25422 Effusion, left elbow: Secondary | ICD-10-CM | POA: Diagnosis not present

## 2019-07-01 DIAGNOSIS — S065X0D Traumatic subdural hemorrhage without loss of consciousness, subsequent encounter: Secondary | ICD-10-CM | POA: Diagnosis not present

## 2019-07-01 DIAGNOSIS — R3915 Urgency of urination: Secondary | ICD-10-CM | POA: Diagnosis not present

## 2019-07-01 DIAGNOSIS — W010XXA Fall on same level from slipping, tripping and stumbling without subsequent striking against object, initial encounter: Secondary | ICD-10-CM | POA: Diagnosis present

## 2019-07-01 DIAGNOSIS — S0990XA Unspecified injury of head, initial encounter: Secondary | ICD-10-CM | POA: Diagnosis not present

## 2019-07-01 DIAGNOSIS — S52125A Nondisplaced fracture of head of left radius, initial encounter for closed fracture: Secondary | ICD-10-CM | POA: Diagnosis not present

## 2019-07-01 DIAGNOSIS — Z79899 Other long term (current) drug therapy: Secondary | ICD-10-CM | POA: Diagnosis not present

## 2019-07-01 DIAGNOSIS — S4992XA Unspecified injury of left shoulder and upper arm, initial encounter: Secondary | ICD-10-CM | POA: Diagnosis not present

## 2019-07-01 DIAGNOSIS — Z853 Personal history of malignant neoplasm of breast: Secondary | ICD-10-CM | POA: Diagnosis not present

## 2019-07-01 DIAGNOSIS — S59902A Unspecified injury of left elbow, initial encounter: Secondary | ICD-10-CM | POA: Diagnosis not present

## 2019-07-01 DIAGNOSIS — M25561 Pain in right knee: Secondary | ICD-10-CM | POA: Diagnosis not present

## 2019-07-01 DIAGNOSIS — R519 Headache, unspecified: Secondary | ICD-10-CM | POA: Diagnosis not present

## 2019-07-01 DIAGNOSIS — Z96653 Presence of artificial knee joint, bilateral: Secondary | ICD-10-CM | POA: Diagnosis not present

## 2019-07-01 DIAGNOSIS — E1140 Type 2 diabetes mellitus with diabetic neuropathy, unspecified: Secondary | ICD-10-CM | POA: Diagnosis not present

## 2019-07-01 DIAGNOSIS — F329 Major depressive disorder, single episode, unspecified: Secondary | ICD-10-CM | POA: Diagnosis not present

## 2019-07-01 DIAGNOSIS — E785 Hyperlipidemia, unspecified: Secondary | ICD-10-CM | POA: Diagnosis not present

## 2019-07-01 DIAGNOSIS — E1142 Type 2 diabetes mellitus with diabetic polyneuropathy: Secondary | ICD-10-CM | POA: Diagnosis not present

## 2019-07-01 DIAGNOSIS — R278 Other lack of coordination: Secondary | ICD-10-CM | POA: Diagnosis not present

## 2019-07-01 DIAGNOSIS — R350 Frequency of micturition: Secondary | ICD-10-CM | POA: Diagnosis not present

## 2019-07-01 LAB — GLUCOSE, CAPILLARY
Glucose-Capillary: 117 mg/dL — ABNORMAL HIGH (ref 70–99)
Glucose-Capillary: 98 mg/dL (ref 70–99)

## 2019-07-01 MED ORDER — ATORVASTATIN CALCIUM 80 MG PO TABS: 80.0000 mg | ORAL_TABLET | Freq: Every day | ORAL | 3 refills | Status: DC

## 2019-07-01 MED ORDER — ASPIRIN 81 MG PO TBEC: 81.0000 mg | DELAYED_RELEASE_TABLET | Freq: Every day | ORAL | 0 refills | Status: DC

## 2019-07-01 NOTE — Progress Notes (Signed)
Advanced Directive paperwork has been delivered gone over with Ms. [name] and her son. I informed them that when a notary and witnesses become available we will complete the paperwork. We will also keep them informed on the progress.  Rev. Latta.

## 2019-07-01 NOTE — Progress Notes (Signed)
Ship broker received referral for pt to participate in [facility] palliative program once pt discharges. Called pts son, [name], to confirm interest and answered all questions. Liaison to follow while in hospital to alert [facility] palliative team who will then outreach family/SNF to arrange Palliative admission visit.     Please do not hesitate to call with any questions and thank you for the referral.     Freddie Breech, RN  [facility] Liaison   [phone]

## 2019-07-01 NOTE — Discharge Summary (Signed)
Physician Discharge Summary  [name] [id number] DOB: [birth year] DOA: 06/26/2019  PCP: Baruch Gouty, FNP  Admit date: 06/26/2019 Discharge date: 07/01/2019  Admitted From: Home Disposition:  SNF  Recommendations for Outpatient Follow-up:  1. Follow up with PCP in 1-2 weeks 2.  Home Health:No Equipment/Devices:None  Discharge Condition:Stable CODE STATUS: DNR Diet recommendation: Heart Healthy  Brief/Interim Summary: 86 y.o. female past medical history of type 2 diabetes mellitus, hyperlipidemia, hypertension frequent UTIs CVAs presents to the ED with altered mental status.  Per the son the patient has been gradually declining over the last 2 months with inability to perform ADLs and worsening arthritis and decreased mobility, in the ED she was CT of the head was negative however her MRI was positive for CVA.   Discharge Diagnoses:  Principal Problem:   CVA (cerebral vascular accident) ([place]) Active Problems:   Hyperlipidemia associated with type 2 diabetes mellitus ([place])   Hypertension associated with diabetes ([place])   Diabetic polyneuropathy associated with type 2 diabetes mellitus ([place])   Chronic diastolic CHF (congestive heart failure) ([facility])   Chronic pain of right knee   Chronic anticoagulation   Chronic left hip pain   Acute encephalopathy   Hypokalemia   Fever, unspecified   Stroke [facility])  CVA: Her A1c was 6.3 fasting lipid panel 179 MRI of the brain showed inferior left basal ganglia thalamic stroke physical therapy and Occupational Therapy ordered consulted who evaluated the patient and recommended skilled nursing facility, speech evaluated the patient recommended regular diet. A 2D echo showed an EF of 60% no emboli patient is in A. fib, neurology recommended to continue Eliquis and add aspirin and aggressive risk factor modification.  Acute metabolic encephalopathy: Likely due to CVA, improved with conservative management.  Chronic atrial  fibrillation: No changes made to her medication rate control continue Eliquis.  Essential hypertension: Antihypertensive medications were held on admission and allow permissive hypertension her blood pressure started trending down, she will resume her medications and outpatient.  Hyperlipidemia associated with diabetes mellitus type 2: She was started on high-dose statin therapy.  Diabetic polyneuropathy associated with diabetes mellitus type 2: No changes made to her medication.  Hypokalemia: Repleted orally now resolved.  Recurrent depression: Continue Cymbalta.  Discharge Instructions  Discharge Instructions    Ambulatory referral to Neurology   Complete by: As directed    An appointment is requested in approximately: 4 weeks   Diet - low sodium heart healthy   Complete by: As directed    Increase activity slowly   Complete by: As directed      Allergies as of 07/01/2019      Reactions   Diltiazem Hcl    Red itchy rash started a few hours after taking short acting 120mg  dilt tabs   Ace Inhibitors Cough      Medication List    STOP taking these medications   nitrofurantoin (macrocrystal-monohydrate) 100 MG capsule Commonly known as: MACROBID   nystatin cream Commonly known as: MYCOSTATIN     TAKE these medications   apixaban 5 MG Tabs tablet Commonly known as: ELIQUIS Take 1 tablet (5 mg total) by mouth 2 (two) times daily.   aspirin 81 MG EC tablet Take 1 tablet (81 mg total) by mouth daily.   atorvastatin 80 MG tablet Commonly known as: LIPITOR Take 1 tablet (80 mg total) by mouth daily at 6 PM.   D-Mannose 500 MG Caps Take 500 mg by mouth daily.   DULoxetine 20 MG capsule Commonly  known as: Cymbalta Take 1 capsule (20 mg total) by mouth daily.   glucose blood test strip Commonly known as: ONE TOUCH ULTRA TEST USE TO check blood sugars twice daily   hydrALAZINE 25 MG tablet Commonly known as: APRESOLINE Take 1 tablet (25 mg total) by mouth 2  (two) times daily.   hydrALAZINE 50 MG tablet Commonly known as: APRESOLINE Take 1 tablet (50 mg total) by mouth 2 (two) times daily.   Insulin Pen Needle 31G X 8 MM Misc Use once daily with lantus solostar   Lantus SoloStar 100 UNIT/ML Solostar Pen Generic drug: Insulin Glargine INJECT 50 UNITS ONCE DAILY AT 10PM What changed: See the new instructions.   Magnesium 250 MG Tabs Take 250 mg by mouth daily.   metFORMIN 500 MG tablet Commonly known as: GLUCOPHAGE TAKE (1) TABLET DAILY WITH BREAKFAST. What changed: See the new instructions.   nabumetone 500 MG tablet Commonly known as: RELAFEN Take 1 tablet (500 mg total) by mouth 2 (two) times daily. For muscleand joint pain What changed: See the new instructions.   omeprazole 20 MG capsule Commonly known as: PRILOSEC TAKE  (1)  CAPSULE  TWICE DAILY (TAKE ON AN EMPTY STOMACH AT LEAST 30 MIN- UTES BEFORE MEALS).   ondansetron 4 MG tablet Commonly known as: ZOFRAN TAKE 1 TABLET EVERY 8 HOURS AS NEEDED FOR NAUSEA & VOMITING   potassium chloride 10 MEQ tablet Commonly known as: KLOR-CON Take 1 tablet (10 mEq total) by mouth daily.   rOPINIRole 0.25 MG tablet Commonly known as: REQUIP Take 1 tablet (0.25 mg total) by mouth 3 (three) times daily. Take one tablet in the morning, one tablet at noon and two tablets at bedtime.   SUPER B COMPLEX/C PO Take 1 tablet by mouth daily.   Vitamin D3 125 MCG (5000 UT) Caps Take 2,000 Units by mouth daily.      Follow-up Information    Pieter Partridge, DO. Schedule an appointment as soon as possible for a visit in 4 week(s).   Specialty: Neurology Contact information: [address] [place] [id number] [phone]          Allergies  Allergen Reactions  . Diltiazem Hcl     Red itchy rash started a few hours after taking short acting 120mg  dilt tabs  . Ace Inhibitors Cough    Consultations:  Neurology   Procedures/Studies: CT ANGIO HEAD W OR WO  CONTRAST  Result Date: 06/27/2019 CLINICAL DATA:  Stroke, follow up EXAM: CT ANGIOGRAPHY HEAD AND NECK TECHNIQUE: Multidetector CT imaging of the head and neck was performed using the standard protocol during bolus administration of intravenous contrast. Multiplanar CT image reconstructions and MIPs were obtained to evaluate the vascular anatomy. Carotid stenosis measurements (when applicable) are obtained utilizing NASCET criteria, using the distal internal carotid diameter as the denominator. CONTRAST:  82mL OMNIPAQUE IOHEXOL 350 MG/ML SOLN COMPARISON:  MRI of the brain June 26, 2019. FINDINGS: CT HEAD FINDINGS Brain: No evidence of new acute infarction, hemorrhage, hydrocephalus, extra-axial collection or mass lesion/mass effect. Small hypodensities in the left lentiform nucleus and thalamus corresponds to the recent infarct seen on prior MRI. Old lacunar infarcts are noted in the right basal ganglia and thalamus. Prominent confluent hypodensity of the white matter of the cerebral hemispheres is most likely related to chronic small vessel ischemia. Vascular: Tree is Skull: Normal. Negative for fracture or focal lesion. Sinuses: Imaged portions are clear. Orbits: Bilateral eye surgery. Review of the MIP  images confirms the above findings CTA NECK FINDINGS Aortic arch: Common origin of the innominate and left common carotid artery. Imaged portion shows no evidence of aneurysm or dissection. Calcified plaques are seen in the aortic arch and at the origin of the major neck arteries. No significant stenosis of the major arch vessel origins. Prominent tortuosity of the major neck arteries may be related to longstanding hypertension. Right carotid system: Mixed density plaque is noted in the right carotid bifurcation, without hemodynamically significant stenosis. Left carotid system: Mixed density plaque is noted in the left carotid bifurcation, without hemodynamically significant stenosis Vertebral arteries:  Mixed density plaque is noted at the origin of the right vertebral artery with approximately 50% stenosis. The left vertebral artery is dominant and its origin is widely patent. There is increased tortuosity and mild luminal irregularity of the cervical vertebral arteries without stenosis. Skeleton: Degenerative changes of the cervical spine are noted with prominent facet degeneration at C3-4 and C4-5. Other neck: 1.1 mm hypodense left thyroid lobe nodule. Upper chest: Negative Review of the MIP images confirms the above findings CTA HEAD FINDINGS Anterior circulation: Calcified plaques are seen in the bilateral carotid siphons extending to the supraclinoid segment on the right side where there is approximately 40% stenosis at the level of the terminus. No significant stenosis of the intracranial left ICA. There is early bifurcation of the left MCA with mild to moderate stenosis at the origin of the superior division branch. The A1 segment of the right ACA is hypoplastic with a dominant left A1/ACA predominantly supplying both A2 segments. Mild luminal irregularity is noted throughout the bilateral MCA and ACA vascular trees, suggestive of intracranial atherosclerotic disease. A prominent right posterior communicating artery seen. Posterior circulation: The left vertebral artery is dominant. The basilar artery has normal course and caliber. The P1 segment of the right posterior cerebral artery is hypoplastic with most arterial flow coming from prominent posterior communicating artery (fetal PCA). Short segment of severe stenosis is noted at the P2 segment of the left posterior cerebral artery. There is also diffuse luminal irregularity throughout the bilateral PCA vascular trees, consistent with intracranial atherosclerotic disease. Venous sinuses: Minimally opacified given contrast timing. Anatomic variants: Right fetal PCA. Hypoplastic right A1/ACA with dominant left A1/ACA. Review of the MIP images confirms the  above findings IMPRESSION: 1. Mixed density plaque in both carotid bulbs without hemodynamically significant stenosis. 2. A 50% stenosis at the origin of the right vertebral artery. 3. Short segment severe stenosis at the P2 segment of the left posterior cerebral artery. 4. Mild to moderate stenosis at the origin of the superior division left MCA branch. 5. A 40% stenosis of the supraclinoid right ICA. 6. Diffuse luminal irregularity throughout the bilateral MCA, ACA and PCA vascular trees, consistent with intracranial atherosclerotic disease. Aortic Atherosclerosis (ICD10-I70.0). Electronically Signed   By: Pedro Earls M.D.   On: 06/27/2019 13:12   CT Head Wo Contrast  Result Date: 06/26/2019 CLINICAL DATA:  Confusion/altered mental status, shaking EXAM: CT HEAD WITHOUT CONTRAST TECHNIQUE: Contiguous axial images were obtained from the base of the skull through the vertex without intravenous contrast. COMPARISON:  11/03/2018 FINDINGS: Motion degraded images. Brain: No evidence of acute infarction, hemorrhage, hydrocephalus, extra-axial collection or mass lesion/mass effect. Global cortical atrophy.  Secondary ventricular prominence. Extensive subcortical white matter and periventricular small vessel ischemic changes. Vascular: Intracranial atherosclerosis. Skull: Normal. Negative for fracture or focal lesion. Sinuses/Orbits: Right frontal sinus is underpneumatized. Visualized paranasal sinuses and mastoid air cells  are otherwise clear. Other: None. IMPRESSION: Motion degraded images. No evidence of acute intracranial abnormality. Atrophy with small vessel ischemic changes. Electronically Signed   By: Julian Hy M.D.   On: 06/26/2019 16:30   CT ANGIO NECK W OR WO CONTRAST  Result Date: 06/27/2019 CLINICAL DATA:  Stroke, follow up EXAM: CT ANGIOGRAPHY HEAD AND NECK TECHNIQUE: Multidetector CT imaging of the head and neck was performed using the standard protocol during bolus  administration of intravenous contrast. Multiplanar CT image reconstructions and MIPs were obtained to evaluate the vascular anatomy. Carotid stenosis measurements (when applicable) are obtained utilizing NASCET criteria, using the distal internal carotid diameter as the denominator. CONTRAST:  56mL OMNIPAQUE IOHEXOL 350 MG/ML SOLN COMPARISON:  MRI of the brain June 26, 2019. FINDINGS: CT HEAD FINDINGS Brain: No evidence of new acute infarction, hemorrhage, hydrocephalus, extra-axial collection or mass lesion/mass effect. Small hypodensities in the left lentiform nucleus and thalamus corresponds to the recent infarct seen on prior MRI. Old lacunar infarcts are noted in the right basal ganglia and thalamus. Prominent confluent hypodensity of the white matter of the cerebral hemispheres is most likely related to chronic small vessel ischemia. Vascular: Tree is Skull: Normal. Negative for fracture or focal lesion. Sinuses: Imaged portions are clear. Orbits: Bilateral eye surgery. Review of the MIP images confirms the above findings CTA NECK FINDINGS Aortic arch: Common origin of the innominate and left common carotid artery. Imaged portion shows no evidence of aneurysm or dissection. Calcified plaques are seen in the aortic arch and at the origin of the major neck arteries. No significant stenosis of the major arch vessel origins. Prominent tortuosity of the major neck arteries may be related to longstanding hypertension. Right carotid system: Mixed density plaque is noted in the right carotid bifurcation, without hemodynamically significant stenosis. Left carotid system: Mixed density plaque is noted in the left carotid bifurcation, without hemodynamically significant stenosis Vertebral arteries: Mixed density plaque is noted at the origin of the right vertebral artery with approximately 50% stenosis. The left vertebral artery is dominant and its origin is widely patent. There is increased tortuosity and mild  luminal irregularity of the cervical vertebral arteries without stenosis. Skeleton: Degenerative changes of the cervical spine are noted with prominent facet degeneration at C3-4 and C4-5. Other neck: 1.1 mm hypodense left thyroid lobe nodule. Upper chest: Negative Review of the MIP images confirms the above findings CTA HEAD FINDINGS Anterior circulation: Calcified plaques are seen in the bilateral carotid siphons extending to the supraclinoid segment on the right side where there is approximately 40% stenosis at the level of the terminus. No significant stenosis of the intracranial left ICA. There is early bifurcation of the left MCA with mild to moderate stenosis at the origin of the superior division branch. The A1 segment of the right ACA is hypoplastic with a dominant left A1/ACA predominantly supplying both A2 segments. Mild luminal irregularity is noted throughout the bilateral MCA and ACA vascular trees, suggestive of intracranial atherosclerotic disease. A prominent right posterior communicating artery seen. Posterior circulation: The left vertebral artery is dominant. The basilar artery has normal course and caliber. The P1 segment of the right posterior cerebral artery is hypoplastic with most arterial flow coming from prominent posterior communicating artery (fetal PCA). Short segment of severe stenosis is noted at the P2 segment of the left posterior cerebral artery. There is also diffuse luminal irregularity throughout the bilateral PCA vascular trees, consistent with intracranial atherosclerotic disease. Venous sinuses: Minimally opacified given contrast timing. Anatomic  variants: Right fetal PCA. Hypoplastic right A1/ACA with dominant left A1/ACA. Review of the MIP images confirms the above findings IMPRESSION: 1. Mixed density plaque in both carotid bulbs without hemodynamically significant stenosis. 2. A 50% stenosis at the origin of the right vertebral artery. 3. Short segment severe stenosis at  the P2 segment of the left posterior cerebral artery. 4. Mild to moderate stenosis at the origin of the superior division left MCA branch. 5. A 40% stenosis of the supraclinoid right ICA. 6. Diffuse luminal irregularity throughout the bilateral MCA, ACA and PCA vascular trees, consistent with intracranial atherosclerotic disease. Aortic Atherosclerosis (ICD10-I70.0). Electronically Signed   By: Pedro Earls M.D.   On: 06/27/2019 13:12   MR BRAIN WO CONTRAST  Result Date: 06/26/2019 CLINICAL DATA:  Altered mental status.  Confusion. EXAM: MRI HEAD WITHOUT CONTRAST TECHNIQUE: Multiplanar, multiecho pulse sequences of the brain and surrounding structures were obtained without intravenous contrast. COMPARISON:  Head CT same day.  MRI 05/28/2017. FINDINGS: Brain: The study does suffer from some motion degradation. There is a small focus of acute infarction at the inferior basal ganglia on the left. Very small focus of involvement of the medial left thalamus. No other acute infarction. Elsewhere, there chronic small-vessel ischemic changes affecting the pons. No focal cerebellar insult. Chronic small-vessel infarctions affect the thalami, and basal ganglia. Confluent chronic small vessel disease affects the cerebral hemispheric white matter. No mass lesion, hemorrhage, hydrocephalus or extra-axial collection. Vascular: Major vessels at the base of the brain show flow. Skull and upper cervical spine: Negative Sinuses/Orbits: Clear/normal Other: None IMPRESSION: Background pattern of extensive chronic small-vessel ischemic changes throughout the brain. Acute subcentimeter infarction at the inferior basal ganglia on the left without mass effect or hemorrhage. Few mm size focus of acute infarction of the medial left thalamus. Electronically Signed   By: Nelson Chimes M.D.   On: 06/26/2019 23:09   DG Chest Portable 1 View  Result Date: 06/26/2019 CLINICAL DATA:  Altered mental status EXAM: PORTABLE  CHEST 1 VIEW COMPARISON:  03/04/2019 FINDINGS: Lungs are clear.  No pleural effusion or pneumothorax. Skin fold at the right lung apex. The heart is normal in size. Prominent epicardial fat along the left heart border. Thoracic aortic atherosclerosis. IMPRESSION: No evidence of acute cardiopulmonary disease. Thoracic aortic atherosclerosis. Electronically Signed   By: Julian Hy M.D.   On: 06/26/2019 16:23   ECHOCARDIOGRAM COMPLETE  Result Date: 06/27/2019    ECHOCARDIOGRAM REPORT   Patient Name:   [name] Date of Exam: 06/27/2019 Medical Rec #:  [id number]        Height:       58.0 in Accession #:    [id number]       Weight:       153.0 lb Date of Birth:  05-Apr-1926        BSA:          1.625 m Patient Age:    37 years         BP:           183/96 mmHg Patient Gender: F                HR:           75 bpm. Exam Location:  Inpatient Procedure: 2D Echo, Cardiac Doppler and Color Doppler Indications:    Stroke 434.91  History:        Patient has prior history of Echocardiogram examinations, most  recent 12/07/2017. CHF, Stroke; Risk Factors:Hypertension,                 Diabetes, Dyslipidemia and Non-Smoker. DOE. GERD.  Sonographer:    Vickie Epley RDCS Referring Phys: [place]  1. Left ventricular ejection fraction, by estimation, is 60 to 65%. The left ventricle has normal function. The left ventricle has no regional wall motion abnormalities.LV diastolic filling could not be assessed due to underlying atrial fibrillation.  2. Right ventricular systolic function is normal. The right ventricular size is normal.  3. The mitral valve is degenerative. No evidence of mitral valve regurgitation. No evidence of mitral stenosis.  4. The aortic valve is tricuspid. Aortic valve regurgitation is not visualized. Mild to moderate aortic valve sclerosis/calcification is present, without any evidence of aortic stenosis.  5. The inferior vena cava is normal in size with greater  than 50% respiratory variability, suggesting right atrial pressure of 3 mmHg.  6. Left atrial size was mild to moderately dilated. FINDINGS  Left Ventricle: Left ventricular ejection fraction, by estimation, is 60 to 65%. The left ventricle has normal function. The left ventricle has no regional wall motion abnormalities. The left ventricular internal cavity size was normal in size. There is  no left ventricular hypertrophy. LC diastolic filling could not be assessed due to underlying atrial fibrillation. Elevated left ventricular end-diastolic pressure. Right Ventricle: The right ventricular size is normal. No increase in right ventricular wall thickness. Right ventricular systolic function is normal. Left Atrium: Left atrial size was mild to moderately dilated. Right Atrium: Right atrial size was normal in size. Pericardium: There is no evidence of pericardial effusion. Mitral Valve: The mitral valve is degenerative in appearance. There is mild calcification of the anterior mitral valve leaflet(s). Normal mobility of the mitral valve leaflets. No evidence of mitral valve regurgitation. No evidence of mitral valve stenosis. Tricuspid Valve: The tricuspid valve is normal in structure. Tricuspid valve regurgitation is trivial. No evidence of tricuspid stenosis. Aortic Valve: The aortic valve is tricuspid. . There is moderate thickening and mild calcification of the aortic valve. Aortic valve regurgitation is not visualized. Mild to moderate aortic valve sclerosis/calcification is present, without any evidence of aortic stenosis. There is moderate thickening of the aortic valve. There is mild calcification of the aortic valve. Pulmonic Valve: The pulmonic valve was normal in structure. Pulmonic valve regurgitation is not visualized. No evidence of pulmonic stenosis. Aorta: The aortic root is normal in size and structure. Venous: The inferior vena cava is normal in size with greater than 50% respiratory variability,  suggesting right atrial pressure of 3 mmHg. IAS/Shunts: No atrial level shunt detected by color flow Doppler.  LEFT VENTRICLE PLAX 2D LVIDd:         5.20 cm  Diastology LVIDs:         3.40 cm  LV e' lateral:   8.84 cm/s LV PW:         0.90 cm  LV E/e' lateral: 14.6 LV IVS:        0.90 cm  LV e' medial:    6.08 cm/s LVOT diam:     1.70 cm  LV E/e' medial:  21.2 LV SV:         55.16 ml LV SV Index:   33.94 LVOT Area:     2.27 cm  RIGHT VENTRICLE RV S prime:     12.30 cm/s TAPSE (M-mode): 1.5 cm LEFT ATRIUM  Index LA diam:        5.20 cm 3.20 cm/m LA Vol (A2C):   58.2 ml 35.81 ml/m LA Vol (A4C):   53.3 ml 32.80 ml/m LA Biplane Vol: 56.4 ml 34.70 ml/m  AORTIC VALVE LVOT Vmax:   104.00 cm/s LVOT Vmean:  80.000 cm/s LVOT VTI:    0.243 m  AORTA Ao Root diam: 3.00 cm MITRAL VALVE MV Area (PHT): 3.17 cm     SHUNTS MV Decel Time: 239 msec     Systemic VTI:  0.24 m MV E velocity: 129.00 cm/s  Systemic Diam: 1.70 cm MV A velocity: 38.30 cm/s MV E/A ratio:  3.37 Fransico Him MD Electronically signed by Fransico Him MD Signature Date/Time: 06/27/2019/12:22:29 PM    Final     2D echo showed preserved ejection fraction.   Subjective: No complaints.  Discharge Exam: Vitals:   07/01/19 0802 07/01/19 0819  BP: (!) 162/66 (!) 155/78  Pulse: 65 70  Resp: (!) 24   Temp: 98.2 F (36.8 C) 97.8 F (36.6 C)  SpO2: 95% 96%   Vitals:   07/01/19 0009 07/01/19 0421 07/01/19 0802 07/01/19 0819  BP: (!) 156/77 (!) 156/94 (!) 162/66 (!) 155/78  Pulse: 82 84 65 70  Resp: 19 17 (!) 24   Temp: 98.8 F (37.1 C) 99.4 F (37.4 C) 98.2 F (36.8 C) 97.8 F (36.6 C)  TempSrc: Oral Oral Oral Oral  SpO2: 93% 94% 95% 96%  Weight:      Height:        General: Pt is alert, awake, not in acute distress Cardiovascular: RRR, S1/S2 +, no rubs, no gallops Respiratory: CTA bilaterally, no wheezing, no rhonchi Abdominal: Soft, NT, ND, bowel sounds + Extremities: no edema, no cyanosis    The results of  significant diagnostics from this hospitalization (including imaging, microbiology, ancillary and laboratory) are listed below for reference.     Microbiology: Recent Results (from the past 240 hour(s))  SARS CORONAVIRUS 2 (TAT 6-24 HRS) Nasopharyngeal Nasopharyngeal Swab     Status: None   Collection Time: 06/26/19  3:49 PM   Specimen: Nasopharyngeal Swab  Result Value Ref Range Status   SARS Coronavirus 2 NEGATIVE NEGATIVE Final    Comment: (NOTE) SARS-CoV-2 target nucleic acids are NOT DETECTED. The SARS-CoV-2 RNA is generally detectable in upper and lower respiratory specimens during the acute phase of infection. Negative results do not preclude SARS-CoV-2 infection, do not rule out co-infections with other pathogens, and should not be used as the sole basis for treatment or other patient management decisions. Negative results must be combined with clinical observations, patient history, and epidemiological information. The expected result is Negative. Fact Sheet for Patients: SugarRoll.be Fact Sheet for Healthcare Providers: https://www.woods-mathews.com/ This test is not yet approved or cleared by the [place] FDA and  has been authorized for detection and/or diagnosis of SARS-CoV-2 by FDA under an Emergency Use Authorization (EUA). This EUA will remain  in effect (meaning this test can be used) for the duration of the COVID-19 declaration under Section 56 4(b)(1) of the Act, 21 U.S.C. section 360bbb-3(b)(1), unless the authorization is terminated or revoked sooner. Performed at [facility], [place] [address] [place], [place] [address]   SARS CORONAVIRUS 2 (TAT 6-24 HRS) Nasopharyngeal Nasopharyngeal Swab     Status: None   Collection Time: 06/30/19  1:10 PM   Specimen: Nasopharyngeal Swab  Result Value Ref Range Status   SARS Coronavirus 2 NEGATIVE NEGATIVE Final  Comment: (NOTE) SARS-CoV-2 target nucleic acids are NOT  DETECTED. The SARS-CoV-2 RNA is generally detectable in upper and lower respiratory specimens during the acute phase of infection. Negative results do not preclude SARS-CoV-2 infection, do not rule out co-infections with other pathogens, and should not be used as the sole basis for treatment or other patient management decisions. Negative results must be combined with clinical observations, patient history, and epidemiological information. The expected result is Negative. Fact Sheet for Patients: SugarRoll.be Fact Sheet for Healthcare Providers: https://www.woods-mathews.com/ This test is not yet approved or cleared by the [place] FDA and  has been authorized for detection and/or diagnosis of SARS-CoV-2 by FDA under an Emergency Use Authorization (EUA). This EUA will remain  in effect (meaning this test can be used) for the duration of the COVID-19 declaration under Section 56 4(b)(1) of the Act, 21 U.S.C. section 360bbb-3(b)(1), unless the authorization is terminated or revoked sooner. Performed at [facility], [place] [address] [place], [place] [address]      Labs: BNP (last 3 results) No results for input(s): BNP in the last 8760 hours. Basic Metabolic Panel: Recent Labs  Lab 06/26/19 1556 06/27/19 0148 06/28/19 0410 06/29/19 0633  NA 138 138 138 135  K 3.2* 3.3* 2.8* 3.6  CL 97* 104 100 102  CO2 31 25 26 24   GLUCOSE 165* 129* 85 82  BUN 18 11 7* 11  CREATININE 0.95 0.71 0.66 0.62  CALCIUM 9.2 8.5* 8.6* 8.6*  MG  --  1.6* 1.9 2.0   Liver Function Tests: Recent Labs  Lab 06/26/19 1556 06/27/19 0148  AST 24 21  ALT 22 17  ALKPHOS 55 46  BILITOT 0.8 0.7  PROT 7.1 6.1*  ALBUMIN 3.9 3.2*   No results for input(s): LIPASE, AMYLASE in the last 168 hours. No results for input(s): AMMONIA in the last 168 hours. CBC: Recent Labs  Lab 06/26/19 1556 06/28/19 0410  WBC 8.9 10.8*  NEUTROABS 7.7 9.1*  HGB 15.2*  14.8  HCT 48.8* 46.6*  MCV 83.1 80.5  PLT 229 208   Cardiac Enzymes: No results for input(s): CKTOTAL, CKMB, CKMBINDEX, TROPONINI in the last 168 hours. BNP: Invalid input(s): POCBNP CBG: Recent Labs  Lab 06/30/19 0627 06/30/19 1121 06/30/19 1642 06/30/19 2121 07/01/19 0617  GLUCAP 87 171* 103* 179* 117*   D-Dimer No results for input(s): DDIMER in the last 72 hours. Hgb A1c No results for input(s): HGBA1C in the last 72 hours. Lipid Profile No results for input(s): CHOL, HDL, LDLCALC, TRIG, CHOLHDL, LDLDIRECT in the last 72 hours. Thyroid function studies No results for input(s): TSH, T4TOTAL, T3FREE, THYROIDAB in the last 72 hours.  Invalid input(s): FREET3 Anemia work up No results for input(s): VITAMINB12, FOLATE, FERRITIN, TIBC, IRON, RETICCTPCT in the last 72 hours. Urinalysis    Component Value Date/Time   COLORURINE YELLOW 06/26/2019 1547   APPEARANCEUR HAZY (A) 06/26/2019 1547   APPEARANCEUR Clear 03/04/2019 1702   LABSPEC 1.021 06/26/2019 1547   PHURINE 6.0 06/26/2019 1547   GLUCOSEU NEGATIVE 06/26/2019 1547   HGBUR NEGATIVE 06/26/2019 1547   BILIRUBINUR NEGATIVE 06/26/2019 1547   BILIRUBINUR Negative 03/04/2019 1702   KETONESUR NEGATIVE 06/26/2019 1547   PROTEINUR 100 (A) 06/26/2019 1547   UROBILINOGEN negative 07/03/2015 1351   UROBILINOGEN 1.0 05/14/2007 1320   NITRITE NEGATIVE 06/26/2019 1547   LEUKOCYTESUR NEGATIVE 06/26/2019 1547   Sepsis Labs Invalid input(s): PROCALCITONIN,  WBC,  LACTICIDVEN Microbiology Recent Results (from the past 240 hour(s))  SARS  CORONAVIRUS 2 (TAT 6-24 HRS) Nasopharyngeal Nasopharyngeal Swab     Status: None   Collection Time: 06/26/19  3:49 PM   Specimen: Nasopharyngeal Swab  Result Value Ref Range Status   SARS Coronavirus 2 NEGATIVE NEGATIVE Final    Comment: (NOTE) SARS-CoV-2 target nucleic acids are NOT DETECTED. The SARS-CoV-2 RNA is generally detectable in upper and lower respiratory specimens during the  acute phase of infection. Negative results do not preclude SARS-CoV-2 infection, do not rule out co-infections with other pathogens, and should not be used as the sole basis for treatment or other patient management decisions. Negative results must be combined with clinical observations, patient history, and epidemiological information. The expected result is Negative. Fact Sheet for Patients: SugarRoll.be Fact Sheet for Healthcare Providers: https://www.woods-mathews.com/ This test is not yet approved or cleared by the [place] FDA and  has been authorized for detection and/or diagnosis of SARS-CoV-2 by FDA under an Emergency Use Authorization (EUA). This EUA will remain  in effect (meaning this test can be used) for the duration of the COVID-19 declaration under Section 56 4(b)(1) of the Act, 21 U.S.C. section 360bbb-3(b)(1), unless the authorization is terminated or revoked sooner. Performed at [facility], [place] [address], [place], [place] [address]   SARS CORONAVIRUS 2 (TAT 6-24 HRS) Nasopharyngeal Nasopharyngeal Swab     Status: None   Collection Time: 06/30/19  1:10 PM   Specimen: Nasopharyngeal Swab  Result Value Ref Range Status   SARS Coronavirus 2 NEGATIVE NEGATIVE Final    Comment: (NOTE) SARS-CoV-2 target nucleic acids are NOT DETECTED. The SARS-CoV-2 RNA is generally detectable in upper and lower respiratory specimens during the acute phase of infection. Negative results do not preclude SARS-CoV-2 infection, do not rule out co-infections with other pathogens, and should not be used as the sole basis for treatment or other patient management decisions. Negative results must be combined with clinical observations, patient history, and epidemiological information. The expected result is Negative. Fact Sheet for Patients: SugarRoll.be Fact Sheet for Healthcare  Providers: https://www.woods-mathews.com/ This test is not yet approved or cleared by the [place] FDA and  has been authorized for detection and/or diagnosis of SARS-CoV-2 by FDA under an Emergency Use Authorization (EUA). This EUA will remain  in effect (meaning this test can be used) for the duration of the COVID-19 declaration under Section 56 4(b)(1) of the Act, 21 U.S.C. section 360bbb-3(b)(1), unless the authorization is terminated or revoked sooner. Performed at [facility], [place] [address] [place], [place] [address]      Time coordinating discharge: Over 30 minutes  SIGNED:   Charlynne Cousins, MD  [facility] 07/01/2019, 8:26 AM Pager   If 7PM-7AM, please contact night-coverage [email or web address] Password [email or web address]

## 2019-07-01 NOTE — Discharge Instructions (Signed)

## 2019-07-01 NOTE — TOC Transition Note (Signed)
Transition of Care [facility]) - CM/SW Discharge Note   Patient Details  Name: OLUCHI CANUP MRN: [id number] Date of Birth: 1925-11-27  [facility] CM/SW Contact:  Geralynn Ochs, [name] Phone Number: 07/01/2019, 2:13 PM   Clinical Narrative:   Nurse to call report to [phone], Room 24.    Final next level of care: Skilled Nursing Facility Barriers to Discharge: Barriers Resolved   Patient Goals and CMS Choice Patient states their goals for this hospitalization and ongoing recovery are:: patient confused, unable to participate in goal setting CMS Medicare.gov Compare Post Acute Care list provided to:: Patient Represenative (must comment) Choice offered to / list presented to : Adult Children  Discharge Placement              Patient chooses bed at: [facility] Patient to be transferred to facility by: [facility] Name of family member notified: Self, [name] Patient and family notified of of transfer: 07/01/19  Discharge Plan and Services                                     Social Determinants of Health (SDOH) Interventions     Readmission Risk Interventions No flowsheet data found.

## 2019-07-01 NOTE — Progress Notes (Signed)
Report given to facility. Pt. Stable at discharge.

## 2019-07-04 ENCOUNTER — Encounter: Payer: Self-pay | Admitting: Neurology

## 2019-07-06 DIAGNOSIS — E1142 Type 2 diabetes mellitus with diabetic polyneuropathy: Secondary | ICD-10-CM | POA: Diagnosis not present

## 2019-07-06 DIAGNOSIS — I10 Essential (primary) hypertension: Secondary | ICD-10-CM | POA: Diagnosis not present

## 2019-07-06 DIAGNOSIS — M1990 Unspecified osteoarthritis, unspecified site: Secondary | ICD-10-CM | POA: Diagnosis not present

## 2019-07-06 DIAGNOSIS — I639 Cerebral infarction, unspecified: Secondary | ICD-10-CM | POA: Diagnosis not present

## 2019-07-07 DIAGNOSIS — E1142 Type 2 diabetes mellitus with diabetic polyneuropathy: Secondary | ICD-10-CM | POA: Diagnosis not present

## 2019-07-07 DIAGNOSIS — M25559 Pain in unspecified hip: Secondary | ICD-10-CM | POA: Diagnosis not present

## 2019-07-07 DIAGNOSIS — I10 Essential (primary) hypertension: Secondary | ICD-10-CM | POA: Diagnosis not present

## 2019-07-07 DIAGNOSIS — M1990 Unspecified osteoarthritis, unspecified site: Secondary | ICD-10-CM | POA: Diagnosis not present

## 2019-07-07 DIAGNOSIS — I639 Cerebral infarction, unspecified: Secondary | ICD-10-CM | POA: Diagnosis not present

## 2019-07-12 ENCOUNTER — Ambulatory Visit (INDEPENDENT_AMBULATORY_CARE_PROVIDER_SITE_OTHER): Payer: PPO | Admitting: Licensed Clinical Social Worker

## 2019-07-12 DIAGNOSIS — E785 Hyperlipidemia, unspecified: Secondary | ICD-10-CM | POA: Diagnosis not present

## 2019-07-12 DIAGNOSIS — M8949 Other hypertrophic osteoarthropathy, multiple sites: Secondary | ICD-10-CM | POA: Diagnosis not present

## 2019-07-12 DIAGNOSIS — E1169 Type 2 diabetes mellitus with other specified complication: Secondary | ICD-10-CM

## 2019-07-12 DIAGNOSIS — E1140 Type 2 diabetes mellitus with diabetic neuropathy, unspecified: Secondary | ICD-10-CM | POA: Diagnosis not present

## 2019-07-12 DIAGNOSIS — E1159 Type 2 diabetes mellitus with other circulatory complications: Secondary | ICD-10-CM | POA: Diagnosis not present

## 2019-07-12 DIAGNOSIS — K219 Gastro-esophageal reflux disease without esophagitis: Secondary | ICD-10-CM

## 2019-07-12 DIAGNOSIS — Z794 Long term (current) use of insulin: Secondary | ICD-10-CM | POA: Diagnosis not present

## 2019-07-12 DIAGNOSIS — M159 Polyosteoarthritis, unspecified: Secondary | ICD-10-CM

## 2019-07-12 DIAGNOSIS — I10 Essential (primary) hypertension: Secondary | ICD-10-CM | POA: Diagnosis not present

## 2019-07-12 DIAGNOSIS — M797 Fibromyalgia: Secondary | ICD-10-CM

## 2019-07-12 NOTE — Patient Instructions (Addendum)
Licensed Clinical Social Worker Visit Information  Goals we discussed today:  Goals        . "I wish I could get out of the house and go to the grocery store" (pt-stated)     Current Barriers:   Lacks caregiver support.    Social isolation in patient with Chronic Diagnoses of Fibromyalgia, Type 2 DM, Hyperlipidemia, Osteoarthritis, GERD, and HTN   Nurse Case Manager Clinical Goal(s):  Marland Kitchen Over the next 30 days, patient will work with CM clinical social worker to address psychosocial issues related to social isolation.   Interventions:    Previously collaborated with RNCM related to client needs.   Talked with [name], son, about client needs at current placement (she is currently receiving care at [facility] in [place], [place])  Talked with [name] about client physical therapy sessions received at facility  Talked with [name] about working with discharge planner at facility when it is time for client to make plans for discharge  Talked with [name] about pain issues of client  Patient Self Care Activities:  . Self administers medications as prescribed . Attends all scheduled provider appointments . Calls pharmacy for medication refills . Attends church or other social activities . Performs ADL's independently . Performs IADL's independently . Calls provider office for new concerns or questions  Initial goal documentation        Materials Provided: No  Follow Up Plan:[name] to call client [name], son, in next 4 weeks to assess the psychosocial needs of client at that time  The patient/[name], son,  verbalized understanding of instructions provided today and declined a print copy of patient instruction materials.   Norva Riffle.[name] MSW, [name] Licensed Clinical Social Worker [facility] [phone]

## 2019-07-12 NOTE — Chronic Care Management (AMB) (Signed)
  Care Management Note   [name] is a 86 y.o. year old female who is a primary care patient of Baruch Gouty, FNP. The CM team was consulted for assistance with chronic disease management and care coordination.   I reached out to [employer] [name], son, by phone today.    Review of patient status, including review of consultants reports, relevant laboratory and other test results, and collaboration with appropriate care team members and the patient's provider was performed as part of comprehensive patient evaluation and provision of chronic care management services.  Social determinants of health :risk of social isolation; risk of depression; risk of stress; risk of physical inactivity     Office Visit from 05/25/2019 in [facility]  PHQ-9 Total Score  13     Medications   apixaban (ELIQUIS) 5 MG TABS tablet aspirin EC 81 MG EC tablet atorvastatin (LIPITOR) 80 MG tablet Cholecalciferol (VITAMIN D3) 5000 units CAPS D-Mannose 500 MG CAPS DULoxetine (CYMBALTA) 20 MG capsule(Expired) glucose blood (ONE TOUCH ULTRA TEST) test strip hydrALAZINE (APRESOLINE) 25 MG tablet hydrALAZINE (APRESOLINE) 50 MG tablet Insulin Pen Needle 31G X 8 MM MISC LANTUS SOLOSTAR 100 UNIT/ML Solostar Pen Magnesium 250 MG TABS metFORMIN (GLUCOPHAGE) 500 MG tablet nabumetone (RELAFEN) 500 MG tablet omeprazole (PRILOSEC) 20 MG capsule ondansetron (ZOFRAN) 4 MG tablet potassium chloride (KLOR-CON) 10 MEQ tablet rOPINIRole (REQUIP) 0.25 MG tablet SUPER B COMPLEX/C PO  GOAL:      . "I wish I could get out of the house and go to the grocery store" (pt-stated)     Current Barriers:   Lacks caregiver support.    Social isolation in patient with Chronic Diagnoses of Fibromyalgia, Type 2 DM, Hyperlipidemia, Osteoarthritis, GERD, and HTN   Nurse Case Manager Clinical Goal(s):  Marland Kitchen Over the next 30 days, patient will work with CM clinical social worker to address  psychosocial issues related to social isolation.   Interventions:    Previously collaborated with RNCM related to client needs.   Talked with [name], son, about client needs at current placement (she is currently receiving care at [facility] in [place], [place])  Talked with [name] about client physical therapy sessions received at facility  Talked with [name] about working with discharge planner at facility when it is time for client to make plans for discharge  Talked with [name] about pain issues of client  Patient Self Care Activities:  . Self administers medications as prescribed . Attends all scheduled provider appointments . Calls pharmacy for medication refills . Attends church or other social activities . Performs ADL's independently . Performs IADL's independently . Calls provider office for new concerns or questions  Initial goal documentation          Follow Up Plan:[name] to call client [name], son, in next 4 weeks to assess the psychosocial needs of client at that time  Norva Riffle.Julieth Tugman MSW, [name] Licensed Clinical Social Worker [facility] [phone]

## 2019-07-13 ENCOUNTER — Emergency Department (HOSPITAL_COMMUNITY): Payer: PPO

## 2019-07-13 ENCOUNTER — Encounter (HOSPITAL_COMMUNITY): Payer: Self-pay

## 2019-07-13 ENCOUNTER — Inpatient Hospital Stay (HOSPITAL_COMMUNITY)
Admission: EM | Admit: 2019-07-13 | Discharge: 2019-07-18 | DRG: 086 | Disposition: A | Discharge status: Skilled Nursing Facility | Payer: PPO | Source: Skilled Nursing Facility | Attending: Internal Medicine | Admitting: Internal Medicine

## 2019-07-13 DIAGNOSIS — Z7901 Long term (current) use of anticoagulants: Secondary | ICD-10-CM | POA: Diagnosis not present

## 2019-07-13 DIAGNOSIS — I10 Essential (primary) hypertension: Secondary | ICD-10-CM | POA: Diagnosis present

## 2019-07-13 DIAGNOSIS — I161 Hypertensive emergency: Secondary | ICD-10-CM | POA: Diagnosis not present

## 2019-07-13 DIAGNOSIS — F329 Major depressive disorder, single episode, unspecified: Secondary | ICD-10-CM | POA: Diagnosis not present

## 2019-07-13 DIAGNOSIS — W010XXA Fall on same level from slipping, tripping and stumbling without subsequent striking against object, initial encounter: Secondary | ICD-10-CM | POA: Diagnosis present

## 2019-07-13 DIAGNOSIS — R079 Chest pain, unspecified: Secondary | ICD-10-CM | POA: Diagnosis not present

## 2019-07-13 DIAGNOSIS — S4992XA Unspecified injury of left shoulder and upper arm, initial encounter: Secondary | ICD-10-CM | POA: Diagnosis not present

## 2019-07-13 DIAGNOSIS — I69398 Other sequelae of cerebral infarction: Secondary | ICD-10-CM | POA: Diagnosis not present

## 2019-07-13 DIAGNOSIS — E1151 Type 2 diabetes mellitus with diabetic peripheral angiopathy without gangrene: Secondary | ICD-10-CM | POA: Diagnosis not present

## 2019-07-13 DIAGNOSIS — S52122A Displaced fracture of head of left radius, initial encounter for closed fracture: Secondary | ICD-10-CM | POA: Diagnosis not present

## 2019-07-13 DIAGNOSIS — Z79899 Other long term (current) drug therapy: Secondary | ICD-10-CM | POA: Diagnosis not present

## 2019-07-13 DIAGNOSIS — Z743 Need for continuous supervision: Secondary | ICD-10-CM | POA: Diagnosis not present

## 2019-07-13 DIAGNOSIS — I639 Cerebral infarction, unspecified: Secondary | ICD-10-CM | POA: Diagnosis not present

## 2019-07-13 DIAGNOSIS — S0093XA Contusion of unspecified part of head, initial encounter: Secondary | ICD-10-CM | POA: Diagnosis not present

## 2019-07-13 DIAGNOSIS — E785 Hyperlipidemia, unspecified: Secondary | ICD-10-CM | POA: Diagnosis present

## 2019-07-13 DIAGNOSIS — S065XAA Traumatic subdural hemorrhage with loss of consciousness status unknown, initial encounter: Secondary | ICD-10-CM

## 2019-07-13 DIAGNOSIS — Z20822 Contact with and (suspected) exposure to covid-19: Secondary | ICD-10-CM | POA: Diagnosis present

## 2019-07-13 DIAGNOSIS — S066X0A Traumatic subarachnoid hemorrhage without loss of consciousness, initial encounter: Principal | ICD-10-CM

## 2019-07-13 DIAGNOSIS — Y92128 Other place in nursing home as the place of occurrence of the external cause: Secondary | ICD-10-CM | POA: Diagnosis not present

## 2019-07-13 DIAGNOSIS — S59912A Unspecified injury of left forearm, initial encounter: Secondary | ICD-10-CM | POA: Diagnosis not present

## 2019-07-13 DIAGNOSIS — S065X9A Traumatic subdural hemorrhage with loss of consciousness of unspecified duration, initial encounter: Secondary | ICD-10-CM

## 2019-07-13 DIAGNOSIS — S0990XA Unspecified injury of head, initial encounter: Secondary | ICD-10-CM | POA: Diagnosis not present

## 2019-07-13 DIAGNOSIS — Z7982 Long term (current) use of aspirin: Secondary | ICD-10-CM | POA: Diagnosis not present

## 2019-07-13 DIAGNOSIS — R52 Pain, unspecified: Secondary | ICD-10-CM | POA: Diagnosis not present

## 2019-07-13 DIAGNOSIS — I4891 Unspecified atrial fibrillation: Secondary | ICD-10-CM | POA: Diagnosis not present

## 2019-07-13 DIAGNOSIS — S065X0A Traumatic subdural hemorrhage without loss of consciousness, initial encounter: Secondary | ICD-10-CM | POA: Diagnosis present

## 2019-07-13 DIAGNOSIS — R4182 Altered mental status, unspecified: Secondary | ICD-10-CM | POA: Diagnosis present

## 2019-07-13 DIAGNOSIS — I4821 Permanent atrial fibrillation: Secondary | ICD-10-CM | POA: Diagnosis present

## 2019-07-13 DIAGNOSIS — S066XAA Traumatic subarachnoid hemorrhage with loss of consciousness status unknown, initial encounter: Secondary | ICD-10-CM | POA: Insufficient documentation

## 2019-07-13 DIAGNOSIS — I63312 Cerebral infarction due to thrombosis of left middle cerebral artery: Secondary | ICD-10-CM | POA: Diagnosis not present

## 2019-07-13 DIAGNOSIS — S52125A Nondisplaced fracture of head of left radius, initial encounter for closed fracture: Secondary | ICD-10-CM | POA: Diagnosis present

## 2019-07-13 DIAGNOSIS — R278 Other lack of coordination: Secondary | ICD-10-CM | POA: Diagnosis not present

## 2019-07-13 DIAGNOSIS — M25522 Pain in left elbow: Secondary | ICD-10-CM | POA: Diagnosis not present

## 2019-07-13 DIAGNOSIS — Z853 Personal history of malignant neoplasm of breast: Secondary | ICD-10-CM

## 2019-07-13 DIAGNOSIS — M797 Fibromyalgia: Secondary | ICD-10-CM | POA: Diagnosis not present

## 2019-07-13 DIAGNOSIS — Z66 Do not resuscitate: Secondary | ICD-10-CM | POA: Diagnosis not present

## 2019-07-13 DIAGNOSIS — S066X9A Traumatic subarachnoid hemorrhage with loss of consciousness of unspecified duration, initial encounter: Secondary | ICD-10-CM | POA: Insufficient documentation

## 2019-07-13 DIAGNOSIS — I160 Hypertensive urgency: Secondary | ICD-10-CM | POA: Diagnosis not present

## 2019-07-13 DIAGNOSIS — S199XXA Unspecified injury of neck, initial encounter: Secondary | ICD-10-CM | POA: Diagnosis not present

## 2019-07-13 DIAGNOSIS — S52125D Nondisplaced fracture of head of left radius, subsequent encounter for closed fracture with routine healing: Secondary | ICD-10-CM | POA: Diagnosis not present

## 2019-07-13 DIAGNOSIS — R0902 Hypoxemia: Secondary | ICD-10-CM | POA: Diagnosis not present

## 2019-07-13 DIAGNOSIS — R350 Frequency of micturition: Secondary | ICD-10-CM | POA: Diagnosis not present

## 2019-07-13 DIAGNOSIS — N390 Urinary tract infection, site not specified: Secondary | ICD-10-CM | POA: Diagnosis not present

## 2019-07-13 DIAGNOSIS — Z794 Long term (current) use of insulin: Secondary | ICD-10-CM | POA: Diagnosis not present

## 2019-07-13 DIAGNOSIS — S59902A Unspecified injury of left elbow, initial encounter: Secondary | ICD-10-CM | POA: Diagnosis not present

## 2019-07-13 DIAGNOSIS — M79602 Pain in left arm: Secondary | ICD-10-CM | POA: Diagnosis not present

## 2019-07-13 DIAGNOSIS — Z96653 Presence of artificial knee joint, bilateral: Secondary | ICD-10-CM | POA: Diagnosis not present

## 2019-07-13 DIAGNOSIS — E876 Hypokalemia: Secondary | ICD-10-CM | POA: Diagnosis not present

## 2019-07-13 DIAGNOSIS — I493 Ventricular premature depolarization: Secondary | ICD-10-CM | POA: Diagnosis not present

## 2019-07-13 DIAGNOSIS — F339 Major depressive disorder, recurrent, unspecified: Secondary | ICD-10-CM | POA: Diagnosis present

## 2019-07-13 DIAGNOSIS — R279 Unspecified lack of coordination: Secondary | ICD-10-CM | POA: Diagnosis not present

## 2019-07-13 DIAGNOSIS — R58 Hemorrhage, not elsewhere classified: Secondary | ICD-10-CM | POA: Diagnosis not present

## 2019-07-13 DIAGNOSIS — Z03818 Encounter for observation for suspected exposure to other biological agents ruled out: Secondary | ICD-10-CM | POA: Diagnosis not present

## 2019-07-13 DIAGNOSIS — M25422 Effusion, left elbow: Secondary | ICD-10-CM | POA: Diagnosis not present

## 2019-07-13 DIAGNOSIS — Z8673 Personal history of transient ischemic attack (TIA), and cerebral infarction without residual deficits: Secondary | ICD-10-CM | POA: Diagnosis not present

## 2019-07-13 DIAGNOSIS — E1159 Type 2 diabetes mellitus with other circulatory complications: Secondary | ICD-10-CM | POA: Diagnosis not present

## 2019-07-13 DIAGNOSIS — S065X0D Traumatic subdural hemorrhage without loss of consciousness, subsequent encounter: Secondary | ICD-10-CM | POA: Diagnosis not present

## 2019-07-13 DIAGNOSIS — E1140 Type 2 diabetes mellitus with diabetic neuropathy, unspecified: Secondary | ICD-10-CM | POA: Diagnosis present

## 2019-07-13 DIAGNOSIS — R519 Headache, unspecified: Secondary | ICD-10-CM | POA: Diagnosis not present

## 2019-07-13 DIAGNOSIS — W19XXXA Unspecified fall, initial encounter: Secondary | ICD-10-CM

## 2019-07-13 DIAGNOSIS — R319 Hematuria, unspecified: Secondary | ICD-10-CM | POA: Diagnosis not present

## 2019-07-13 DIAGNOSIS — M6281 Muscle weakness (generalized): Secondary | ICD-10-CM | POA: Diagnosis not present

## 2019-07-13 DIAGNOSIS — R3915 Urgency of urination: Secondary | ICD-10-CM | POA: Diagnosis not present

## 2019-07-13 LAB — CBC WITH DIFFERENTIAL/PLATELET
Abs Immature Granulocytes: 0.03 10*3/uL (ref 0.00–0.07)
Basophils Absolute: 0.1 10*3/uL (ref 0.0–0.1)
Basophils Relative: 1 %
Eosinophils Absolute: 0.3 10*3/uL (ref 0.0–0.5)
Eosinophils Relative: 3 %
HCT: 46.8 % — ABNORMAL HIGH (ref 36.0–46.0)
Hemoglobin: 14.5 g/dL (ref 12.0–15.0)
Immature Granulocytes: 0 %
Lymphocytes Relative: 12 %
Lymphs Abs: 1.2 10*3/uL (ref 0.7–4.0)
MCH: 26.1 pg (ref 26.0–34.0)
MCHC: 31 g/dL (ref 30.0–36.0)
MCV: 84.3 fL (ref 80.0–100.0)
Monocytes Absolute: 1 10*3/uL (ref 0.1–1.0)
Monocytes Relative: 10 %
Neutro Abs: 7.8 10*3/uL — ABNORMAL HIGH (ref 1.7–7.7)
Neutrophils Relative %: 74 %
Platelets: 264 10*3/uL (ref 150–400)
RBC: 5.55 MIL/uL — ABNORMAL HIGH (ref 3.87–5.11)
RDW: 17.6 % — ABNORMAL HIGH (ref 11.5–15.5)
WBC: 10.4 10*3/uL (ref 4.0–10.5)
nRBC: 0 % (ref 0.0–0.2)

## 2019-07-13 LAB — COMPREHENSIVE METABOLIC PANEL
ALT: 24 U/L (ref 0–44)
AST: 26 U/L (ref 15–41)
Albumin: 3.5 g/dL (ref 3.5–5.0)
Alkaline Phosphatase: 48 U/L (ref 38–126)
Anion gap: 11 (ref 5–15)
BUN: 19 mg/dL (ref 8–23)
CO2: 26 mmol/L (ref 22–32)
Calcium: 9.2 mg/dL (ref 8.9–10.3)
Chloride: 100 mmol/L (ref 98–111)
Creatinine, Ser: 0.95 mg/dL (ref 0.44–1.00)
GFR calc Af Amer: >60 mL/min (ref >60)
GFR calc non Af Amer: 52 mL/min — ABNORMAL LOW (ref >60)
Glucose, Bld: 157 mg/dL — ABNORMAL HIGH (ref 70–99)
Potassium: 4.2 mmol/L (ref 3.5–5.1)
Sodium: 137 mmol/L (ref 135–145)
Total Bilirubin: 0.7 mg/dL (ref 0.3–1.2)
Total Protein: 6.6 g/dL (ref 6.5–8.1)

## 2019-07-13 LAB — TYPE AND SCREEN
ABO/RH(D): A POS
Antibody Screen: NEGATIVE

## 2019-07-13 LAB — PROTIME-INR
INR: 1.2 (ref 0.8–1.2)
Prothrombin Time: 15.4 seconds — ABNORMAL HIGH (ref 11.4–15.2)

## 2019-07-13 MED ORDER — HYDROMORPHONE HCL 1 MG/ML IJ SOLN
0.5000 mg | Freq: Once | INTRAMUSCULAR | Status: AC
  Administered 2019-07-13: 23:00:00 0.5 mg via INTRAVENOUS
  Filled 2019-07-13: qty 1

## 2019-07-13 MED ORDER — LABETALOL HCL 5 MG/ML IV SOLN
10.0000 mg | INTRAVENOUS | Status: DC | PRN
  Administered 2019-07-14 – 2019-07-18 (×5): 10 mg via INTRAVENOUS
  Filled 2019-07-13 (×5): qty 4

## 2019-07-13 NOTE — ED Triage Notes (Signed)
Pt comes via [facility] from [facility] after unwitnessed mechanical fall, on thinners hematoma to back of head, deformity and laceration to L elbow. PTA 75 mcg fentanyl

## 2019-07-13 NOTE — ED Provider Notes (Signed)
[facility] EMERGENCY DEPARTMENT Provider Note   CSN: [id number] Arrival date & time: 07/13/19  2109     History Chief Complaint  Patient presents with  . Fall    [name] is a 86 y.o. female.  The history is provided by the patient, the EMS personnel and medical records. No language interpreter was used.  Fall   [name] is a 86 y.o. female who presents to the Emergency Department complaining of fall.  She presents to the ED by EMS for evaluation of injuries following a fall.  She had spilled water on the floor and slipped on it, falling to the ground.  She struck her head and left arm.  No LOC.  She complains of pain to the left elbow and neck.  She is on eliquis for afib.  She was recently admitted to the hospital for AMS.  She has been feeling well since hospital discharge and denies any additional complaints.  Sxs are moderate to severe and constant in nature.      Past Medical History:  Diagnosis Date  . Arthritis   . Atrial fibrillation ([place])   . Cancer ([place])    breast  . Cataract   . Diabetes mellitus without complication ([place])   . Frequent UTI   . Hyperlipidemia   . Hypertension   . Neck pain   . Scoliosis   . Stroke [facility])     Patient Active Problem List   Diagnosis Date Noted  . Subarachnoid hemorrhage following injury ([place]) 07/13/2019  . Acute encephalopathy 06/27/2019  . Heat stroke 06/27/2019  . Hypokalemia 06/27/2019  . Fever, unspecified 06/27/2019  . Stroke ([place]) 06/27/2019  . Altered mental status 06/26/2019  . Right hip pain 06/22/2019  . Chronic left hip pain 05/25/2019  . Depression, recurrent ([place]) 05/25/2019  . Chronic anticoagulation 04/19/2019  . Leg cramps 03/04/2019  . Short-term memory loss 03/04/2019  . DOE (dyspnea on exertion) 03/04/2019  . Aortic atherosclerosis ([place]) 03/04/2019  . Physical deconditioning 01/24/2019  . History of recurrent UTIs 12/03/2018  . Chronic pain of right knee 10/08/2018   . Type 2 diabetes mellitus with diabetic neuropathy, unspecified ([place]) 08/12/2018  . Pulmonary vascular congestion   . Chronic diastolic CHF (congestive heart failure) ([place])   . Acute hypoxemic respiratory failure ([place]) 12/06/2017  . Permanent atrial fibrillation ([place]) 12/06/2017  . Trochanteric bursitis, right hip 07/16/2017  . Other intervertebral disc degeneration, lumbar region 07/16/2017  . Osteoporosis 11/04/2016  . Diabetic polyneuropathy associated with type 2 diabetes mellitus ([place]) 11/14/2014  . Fibromyalgia 02/11/2013  . CVA (cerebral vascular accident) ([place]) 08/01/2008  . Osteoarthritis 04/09/2007  . Hyperlipidemia associated with type 2 diabetes mellitus ([place]) 03/08/2007  . Hypertension associated with diabetes ([place]) 03/08/2007  . Pulmonary fibrosis ([place]) 03/08/2007  . GERD 03/08/2007  . BREAST CANCER, HX OF 03/08/2007    Past Surgical History:  Procedure Laterality Date  . APPENDECTOMY    . BREAST LUMPECTOMY Left   . CHOLECYSTECTOMY    . JOINT REPLACEMENT     bilat knee   . REPLACEMENT TOTAL KNEE BILATERAL Bilateral   . TONSILLECTOMY       OB History   No obstetric history on file.     Family History  Problem Relation Age of Onset  . Cancer Mother        lung  . Arthritis Mother   . Heart disease Father        endocarditis  . Cancer  Brother        lung, throat  . Alcohol abuse Brother     Social History   Tobacco Use  . Smoking status: Never Smoker  . Smokeless tobacco: Never Used  Substance Use Topics  . Alcohol use: No    Alcohol/week: 0.0 standard drinks  . Drug use: No    Home Medications Prior to Admission medications   Medication Sig Start Date End Date Taking? Authorizing Provider  apixaban (ELIQUIS) 5 MG TABS tablet Take 1 tablet (5 mg total) by mouth 2 (two) times daily. 07/27/18  Yes Baruch Gouty, FNP  aspirin EC 81 MG EC tablet Take 1 tablet (81 mg total) by mouth daily. 07/01/19  Yes Charlynne Cousins, MD  atorvastatin  (LIPITOR) 80 MG tablet Take 1 tablet (80 mg total) by mouth daily at 6 PM. Patient taking differently: Take 80 mg by mouth at bedtime.  07/01/19  Yes Charlynne Cousins, MD  butalbital-aspirin-caffeine [facility]) [phone] MG capsule Take 1 capsule by mouth every 6 (six) hours as needed for headache.  07/13/19  Yes [provider]  Cholecalciferol (VITAMIN D3) 50 MCG (2000 UT) TABS Take 2,000 Units by mouth in the morning.   Yes [provider]  DULoxetine (CYMBALTA) 20 MG capsule Take 1 capsule (20 mg total) by mouth daily. 05/25/19 07/13/19 Yes Rakes, Connye Burkitt, FNP  hydrALAZINE (APRESOLINE) 50 MG tablet Take 1 tablet (50 mg total) by mouth 2 (two) times daily. 06/23/19 07/23/19 Yes Rakes, Connye Burkitt, FNP  LANTUS SOLOSTAR 100 UNIT/ML Solostar Pen INJECT 50 UNITS ONCE DAILY AT 10PM Patient taking differently: Inject 50 Units into the skin at bedtime.  06/18/19  Yes Rakes, Connye Burkitt, FNP  Lidocaine 4 % PTCH Apply 1 patch topically See admin instructions. Apply 1 patch to each hip daily for pain- on 12 hrs/off 12 hrs   Yes [provider]  Magnesium 250 MG TABS Take 250 mg by mouth daily.   Yes [provider]  metFORMIN (GLUCOPHAGE) 500 MG tablet TAKE (1) TABLET DAILY WITH BREAKFAST. Patient taking differently: Take 500 mg by mouth daily with breakfast.  03/18/19  Yes Rakes, Connye Burkitt, FNP  omeprazole (PRILOSEC) 20 MG capsule TAKE  (1)  CAPSULE  TWICE DAILY (TAKE ON AN EMPTY STOMACH AT LEAST 30 MIN- UTES BEFORE MEALS). Patient taking differently: Take 20 mg by mouth 2 (two) times daily before a meal.  05/23/19  Yes Rakes, Connye Burkitt, FNP  ondansetron (ZOFRAN) 4 MG tablet TAKE 1 TABLET EVERY 8 HOURS AS NEEDED FOR NAUSEA & VOMITING Patient taking differently: Take 4 mg by mouth every 8 (eight) hours as needed for nausea or vomiting.  06/20/19  Yes Rakes, Connye Burkitt, FNP  potassium chloride (KLOR-CON) 10 MEQ tablet Take 1 tablet (10 mEq total) by mouth daily. Patient taking differently:  Take 10 mEq by mouth in the morning.  06/20/19  Yes Rakes, Connye Burkitt, FNP  rOPINIRole (REQUIP) 0.25 MG tablet Take 1 tablet (0.25 mg total) by mouth 3 (three) times daily. Take one tablet in the morning, one tablet at noon and two tablets at bedtime. Patient taking differently: Take 0.25 mg by mouth See admin instructions. Take 0.25 mg by mouth two times a day and 0.25 mg at bedtime 06/23/19 07/23/19 Yes Rakes, Connye Burkitt, FNP  SUPER B COMPLEX/C PO Take 1 tablet by mouth daily.   Yes [provider]  glucose blood (ONE TOUCH ULTRA TEST) test strip USE TO check blood sugars twice daily  10/30/17   Claretta Fraise, MD  hydrALAZINE (APRESOLINE) 25 MG tablet Take 1 tablet (25 mg total) by mouth 2 (two) times daily. Patient not taking: Reported on 07/13/2019 06/23/19 07/23/19  Baruch Gouty, FNP  Insulin Pen Needle 31G X 8 MM MISC Use once daily with lantus solostar 10/30/17   Claretta Fraise, MD  nabumetone (RELAFEN) 500 MG tablet Take 1 tablet (500 mg total) by mouth 2 (two) times daily. For muscleand joint pain Patient not taking: No sig reported 12/16/18   Claretta Fraise, MD  Insulin Glargine (LANTUS SOLOSTAR) 100 UNIT/ML Solostar Pen INJECT 50 UNITS ONCE DAILY AT 10PM 11/01/18   Baruch Gouty, FNP    Allergies    Diltiazem hcl, Ace inhibitors, and Cymbalta [duloxetine hcl]  Review of Systems   Review of Systems  All other systems reviewed and are negative.   Physical Exam Updated Vital Signs BP (!) 157/86   Pulse 62   Temp (!) 97.3 F (36.3 C) (Temporal)   Resp 18   Ht 4\' 11"  (1.499 m)   Wt 69.4 kg   SpO2 92%   BMI 30.90 kg/m   Physical Exam Vitals and nursing note reviewed.  Constitutional:      Appearance: She is well-developed.  HENT:     Head: Normocephalic.     Comments: Hematoma to occipital scalp Neck:     Comments: ccollar in place.  No midline neck tenderness to palpation. Cardiovascular:     Rate and Rhythm: Normal rate and regular rhythm.     Heart sounds: No murmur.    Pulmonary:     Effort: Pulmonary effort is normal. No respiratory distress.     Breath sounds: Normal breath sounds.  Abdominal:     Palpations: Abdomen is soft.     Tenderness: There is no abdominal tenderness. There is no guarding or rebound.  Musculoskeletal:     Comments: TTP over left elbow and forearm with decreased ROM.  Position of comfort is flexed to 90degrees.  No wrist or shoulder tenderness.  Wiggles all digits.  2+ radial pulses bilaterally. Skin tear to the left elbow.  Skin:    General: Skin is warm and dry.  Neurological:     Mental Status: She is alert and oriented to person, place, and time.     Comments: MAE  Psychiatric:        Behavior: Behavior normal.     ED Results / Procedures / Treatments   Labs (all labs ordered are listed, but only abnormal results are displayed) Labs Reviewed  COMPREHENSIVE METABOLIC PANEL - Abnormal; Notable for the following components:      Result Value   Glucose, Bld 157 (*)    GFR calc non Af Amer 52 (*)    All other components within normal limits  CBC WITH DIFFERENTIAL/PLATELET - Abnormal; Notable for the following components:   RBC 5.55 (*)    HCT 46.8 (*)    RDW 17.6 (*)    Neutro Abs 7.8 (*)    All other components within normal limits  PROTIME-INR - Abnormal; Notable for the following components:   Prothrombin Time 15.4 (*)    All other components within normal limits  SARS CORONAVIRUS 2 (TAT 6-24 HRS)  TYPE AND SCREEN    EKG EKG Interpretation  Date/Time:  Wednesday July 13 2019 21:25:18 EST Ventricular Rate:  73 PR Interval:    QRS Duration: 103 QT Interval:  412 QTC Calculation: 454 R Axis:   61 Text  Interpretation: Atrial fibrillation Ventricular premature complex Baseline wander in lead(s) II III aVR aVL aVF V3 V4 V5 Confirmed by Quintella Reichert [id number]) on 07/13/2019 9:37:34 PM   Radiology DG Elbow Complete Left  Result Date: 07/13/2019 CLINICAL DATA:  Recent fall with left elbow pain, initial  encounter EXAM: LEFT ELBOW - COMPLETE 3+ VIEW COMPARISON:  None. FINDINGS: There is no evidence of fracture, dislocation, or joint effusion. There is no evidence of arthropathy or other focal bone abnormality. Soft tissues are unremarkable. IMPRESSION: No acute abnormality noted. Electronically Signed   By: Inez Catalina M.D.   On: 07/13/2019 22:10   DG Forearm Left  Result Date: 07/13/2019 CLINICAL DATA:  Recent fall with left forearm pain, initial encounter EXAM: LEFT FOREARM - 2 VIEW COMPARISON:  None. FINDINGS: No acute fracture or dislocation is noted. No soft tissue abnormality is noted. Degenerative changes of the radiocarpal joint are seen. Prior posttraumatic changes of the distal radius and ulna are seen. IMPRESSION: No acute abnormality noted. Electronically Signed   By: Inez Catalina M.D.   On: 07/13/2019 22:12   CT Head Wo Contrast  Result Date: 07/13/2019 CLINICAL DATA:  Unwitnessed mechanical fall EXAM: CT HEAD WITHOUT CONTRAST CT CERVICAL SPINE WITHOUT CONTRAST TECHNIQUE: Multidetector CT imaging of the head and cervical spine was performed following the standard protocol without intravenous contrast. Multiplanar CT image reconstructions of the cervical spine were also generated. COMPARISON:  06/26/2019 FINDINGS: CT HEAD FINDINGS Brain: There is a trace volume of subarachnoid hemorrhage along the para falcine left frontal lobe (axial series 1, image 15). There is no midline shift. Advanced atrophy and chronic microvascular ischemic changes are noted. There is a small subdural hematoma along the right frontoparietal convexity measuring up to approximately 4 mm in width (coronal series 3, image 39). Vascular: No hyperdense vessel or unexpected calcification. Skull: Normal. Negative for fracture or focal lesion. Sinuses/Orbits: No acute finding. Other: None. CT CERVICAL SPINE FINDINGS Alignment: Normal. Skull base and vertebrae: No acute fracture. No primary bone lesion or focal pathologic  process. Soft tissues and spinal canal: No prevertebral fluid or swelling. No visible canal hematoma. Disc levels: Mild multilevel disc degenerative changes are noted, greatest at the C5-C6 and C6-C7 levels. Upper chest: Negative. Other: None IMPRESSION: 1. There is a small volume of acute mixed subdural and subarachnoid hemorrhage as detailed above. There is no evidence for midline shift or mass effect. 2. Advanced atrophy and chronic microvascular ischemic changes are noted. 3. No acute cervical spine fracture. These results were called by telephone at the time of interpretation on 07/13/2019 at 9:50 pm to provider [facility] , who verbally acknowledged these results. Electronically Signed   By: Constance Holster M.D.   On: 07/13/2019 21:53   CT Cervical Spine Wo Contrast  Result Date: 07/13/2019 CLINICAL DATA:  Unwitnessed mechanical fall EXAM: CT HEAD WITHOUT CONTRAST CT CERVICAL SPINE WITHOUT CONTRAST TECHNIQUE: Multidetector CT imaging of the head and cervical spine was performed following the standard protocol without intravenous contrast. Multiplanar CT image reconstructions of the cervical spine were also generated. COMPARISON:  06/26/2019 FINDINGS: CT HEAD FINDINGS Brain: There is a trace volume of subarachnoid hemorrhage along the para falcine left frontal lobe (axial series 1, image 15). There is no midline shift. Advanced atrophy and chronic microvascular ischemic changes are noted. There is a small subdural hematoma along the right frontoparietal convexity measuring up to approximately 4 mm in width (coronal series 3, image 39). Vascular: No hyperdense vessel or unexpected calcification. Skull:  Normal. Negative for fracture or focal lesion. Sinuses/Orbits: No acute finding. Other: None. CT CERVICAL SPINE FINDINGS Alignment: Normal. Skull base and vertebrae: No acute fracture. No primary bone lesion or focal pathologic process. Soft tissues and spinal canal: No prevertebral fluid or swelling. No  visible canal hematoma. Disc levels: Mild multilevel disc degenerative changes are noted, greatest at the C5-C6 and C6-C7 levels. Upper chest: Negative. Other: None IMPRESSION: 1. There is a small volume of acute mixed subdural and subarachnoid hemorrhage as detailed above. There is no evidence for midline shift or mass effect. 2. Advanced atrophy and chronic microvascular ischemic changes are noted. 3. No acute cervical spine fracture. These results were called by telephone at the time of interpretation on 07/13/2019 at 9:50 pm to provider [facility] , who verbally acknowledged these results. Electronically Signed   By: Constance Holster M.D.   On: 07/13/2019 21:53   DG Chest Port 1 View  Result Date: 07/13/2019 CLINICAL DATA:  Recent fall with chest pain, initial encounter EXAM: PORTABLE CHEST 1 VIEW COMPARISON:  06/26/2019 FINDINGS: Cardiac shadow is stable. Aortic calcifications are noted. Lungs are hypoinflated but clear. Postsurgical changes in the left breast are noted. No bony abnormality is seen. IMPRESSION: Poor inspiratory effort without acute abnormality. Electronically Signed   By: Inez Catalina M.D.   On: 07/13/2019 22:24   DG Humerus Left  Result Date: 07/13/2019 CLINICAL DATA:  Recent fall with left arm pain, initial encounter EXAM: LEFT HUMERUS - 2+ VIEW COMPARISON:  None. FINDINGS: Degenerative changes of the glenohumeral articulation are noted. Bony densities are noted adjacent to the superior aspect of the glenoid likely representing loose bodies. No acute fracture or dislocation is noted. The underlying bony thorax is within normal limits. IMPRESSION: Degenerative changes without acute abnormality. Electronically Signed   By: Inez Catalina M.D.   On: 07/13/2019 22:13    Procedures Procedures (including critical care time) CRITICAL CARE Performed by: Quintella Reichert   Total critical care time: 35 minutes  Critical care time was exclusive of separately billable procedures and  treating other patients.  Critical care was necessary to treat or prevent imminent or life-threatening deterioration.  Critical care was time spent personally by me on the following activities: development of treatment plan with patient and/or surrogate as well as nursing, discussions with consultants, evaluation of patient's response to treatment, examination of patient, obtaining history from patient or surrogate, ordering and performing treatments and interventions, ordering and review of laboratory studies, ordering and review of radiographic studies, pulse oximetry and re-evaluation of patient's condition.   Medications Ordered in ED Medications  labetalol (NORMODYNE) injection 10 mg (has no administration in time range)  HYDROmorphone (DILAUDID) injection 0.5 mg (0.5 mg Intravenous Given 07/13/19 2253)    ED Course  I have reviewed the triage vital signs and the nursing notes.  Pertinent labs & imaging results that were available during my care of the patient were reviewed by me and considered in my medical decision making (see chart for details).    MDM Rules/Calculators/A&P                     Patient here for evaluation of injuries following a ground-level fall.  Imaging is significant for small subdural hematoma with subarachnoid hemorrhage. Her last dose of eliquis was at 9 AM this morning. Discussed the case with Dr. Venetia Constable with neurosurgery, he will see the patient in consult. No anticoagulation reversal recommended at this time. He recommends medicine admission. Patient  does have ongoing posterior neck pain without focal tenderness with range of motion, will place in soft collar. Plain films are negative for elbow fracture but she does have significant swelling in local tenderness, will obtain CT scan to further evaluate occult injury. Given patient's traumatic injuries, history of fall discussed with trauma service. Dr. Barry Dienes states that she will be evaluated in the morning  for a tertiary survey. Hospitalist consulted for admission.  Patient and son updated of findings of studies and recommendation for admission and they are in agreement with treatment plan.  Final Clinical Impression(s) / ED Diagnoses Final diagnoses:  SDH (subdural hematoma) ([place])  Fall, initial encounter    Rx / DC Orders ED Discharge Orders    None       Quintella Reichert, MD 07/13/19 2359

## 2019-07-13 NOTE — ED Notes (Signed)
Transported to CT 

## 2019-07-14 ENCOUNTER — Encounter (HOSPITAL_COMMUNITY): Payer: Self-pay | Admitting: Family Medicine

## 2019-07-14 DIAGNOSIS — S52122A Displaced fracture of head of left radius, initial encounter for closed fracture: Secondary | ICD-10-CM | POA: Diagnosis present

## 2019-07-14 DIAGNOSIS — S0990XA Unspecified injury of head, initial encounter: Secondary | ICD-10-CM

## 2019-07-14 DIAGNOSIS — I160 Hypertensive urgency: Secondary | ICD-10-CM | POA: Insufficient documentation

## 2019-07-14 DIAGNOSIS — S065XAA Traumatic subdural hemorrhage with loss of consciousness status unknown, initial encounter: Secondary | ICD-10-CM | POA: Insufficient documentation

## 2019-07-14 DIAGNOSIS — S065X9A Traumatic subdural hemorrhage with loss of consciousness of unspecified duration, initial encounter: Secondary | ICD-10-CM | POA: Insufficient documentation

## 2019-07-14 HISTORY — DX: Displaced fracture of head of left radius, initial encounter for closed fracture: S52.122A

## 2019-07-14 HISTORY — DX: Unspecified injury of head, initial encounter: S09.90XA

## 2019-07-14 LAB — CBC
HCT: 41.1 % (ref 36.0–46.0)
Hemoglobin: 12.7 g/dL (ref 12.0–15.0)
MCH: 26.4 pg (ref 26.0–34.0)
MCHC: 30.9 g/dL (ref 30.0–36.0)
MCV: 85.4 fL (ref 80.0–100.0)
Platelets: 230 10*3/uL (ref 150–400)
RBC: 4.81 MIL/uL (ref 3.87–5.11)
RDW: 17.3 % — ABNORMAL HIGH (ref 11.5–15.5)
WBC: 10.2 10*3/uL (ref 4.0–10.5)
nRBC: 0 % (ref 0.0–0.2)

## 2019-07-14 LAB — CBG MONITORING, ED
Glucose-Capillary: 162 mg/dL — ABNORMAL HIGH (ref 70–99)
Glucose-Capillary: 90 mg/dL (ref 70–99)
Glucose-Capillary: 183 mg/dL — ABNORMAL HIGH (ref 70–99)

## 2019-07-14 LAB — BASIC METABOLIC PANEL
Anion gap: 10 (ref 5–15)
BUN: 18 mg/dL (ref 8–23)
CO2: 28 mmol/L (ref 22–32)
Calcium: 8.8 mg/dL — ABNORMAL LOW (ref 8.9–10.3)
Chloride: 103 mmol/L (ref 98–111)
Creatinine, Ser: 0.74 mg/dL (ref 0.44–1.00)
GFR calc Af Amer: >60 mL/min (ref >60)
GFR calc non Af Amer: >60 mL/min (ref >60)
Glucose, Bld: 124 mg/dL — ABNORMAL HIGH (ref 70–99)
Potassium: 3.8 mmol/L (ref 3.5–5.1)
Sodium: 141 mmol/L (ref 135–145)

## 2019-07-14 LAB — SARS CORONAVIRUS 2 (TAT 6-24 HRS)
SARS Coronavirus 2: NEGATIVE
SARS Coronavirus 2: NEGATIVE

## 2019-07-14 LAB — GLUCOSE, CAPILLARY
Glucose-Capillary: 106 mg/dL — ABNORMAL HIGH (ref 70–99)
Glucose-Capillary: 113 mg/dL — ABNORMAL HIGH (ref 70–99)
Glucose-Capillary: 142 mg/dL — ABNORMAL HIGH (ref 70–99)

## 2019-07-14 MED ORDER — INSULIN GLARGINE 100 UNIT/ML ~~LOC~~ SOLN
25.0000 [IU] | Freq: Every day | SUBCUTANEOUS | Status: DC
  Filled 2019-07-14: qty 0.25

## 2019-07-14 MED ORDER — MORPHINE SULFATE (PF) 2 MG/ML IV SOLN
1.0000 mg | INTRAVENOUS | Status: DC | PRN
  Administered 2019-07-14 (×2): 2 mg via INTRAVENOUS
  Filled 2019-07-14 (×2): qty 1

## 2019-07-14 MED ORDER — ONDANSETRON HCL 4 MG/2ML IJ SOLN
4.0000 mg | Freq: Four times a day (QID) | INTRAMUSCULAR | Status: DC | PRN
  Administered 2019-07-16: 4 mg via INTRAVENOUS
  Filled 2019-07-14: qty 2

## 2019-07-14 MED ORDER — ATORVASTATIN CALCIUM 80 MG PO TABS
80.0000 mg | ORAL_TABLET | Freq: Every day | ORAL | Status: DC
  Administered 2019-07-14 – 2019-07-17 (×4): 80 mg via ORAL
  Filled 2019-07-14 (×4): qty 1

## 2019-07-14 MED ORDER — HYDRALAZINE HCL 50 MG PO TABS
50.0000 mg | ORAL_TABLET | Freq: Two times a day (BID) | ORAL | Status: DC
  Administered 2019-07-14 – 2019-07-18 (×9): 50 mg via ORAL
  Filled 2019-07-14 (×8): qty 1
  Filled 2019-07-14: qty 2

## 2019-07-14 MED ORDER — MORPHINE SULFATE (PF) 2 MG/ML IV SOLN: 2.0000 mg | INTRAVENOUS | Status: DC | PRN

## 2019-07-14 MED ORDER — PANTOPRAZOLE SODIUM 40 MG PO TBEC
40.0000 mg | DELAYED_RELEASE_TABLET | Freq: Two times a day (BID) | ORAL | Status: DC
  Administered 2019-07-14 – 2019-07-18 (×9): 40 mg via ORAL
  Filled 2019-07-14 (×9): qty 1

## 2019-07-14 MED ORDER — ROPINIROLE HCL 0.25 MG PO TABS
0.2500 mg | ORAL_TABLET | Freq: Two times a day (BID) | ORAL | Status: DC
  Administered 2019-07-14 – 2019-07-18 (×10): 0.25 mg via ORAL
  Filled 2019-07-14 (×12): qty 1

## 2019-07-14 MED ORDER — OXYCODONE HCL 5 MG PO TABS: 5.0000 mg | ORAL_TABLET | ORAL | Status: DC | PRN

## 2019-07-14 MED ORDER — ACETAMINOPHEN 325 MG PO TABS
650.0000 mg | ORAL_TABLET | Freq: Four times a day (QID) | ORAL | Status: DC
  Administered 2019-07-14 – 2019-07-15 (×3): 650 mg via ORAL
  Filled 2019-07-14 (×4): qty 2

## 2019-07-14 MED ORDER — MORPHINE SULFATE (PF) 2 MG/ML IV SOLN
2.0000 mg | INTRAVENOUS | Status: DC | PRN
  Administered 2019-07-14: 2 mg via INTRAVENOUS
  Filled 2019-07-14: qty 1

## 2019-07-14 MED ORDER — POLYETHYLENE GLYCOL 3350 17 G PO PACK: 17.0000 g | PACK | Freq: Every day | ORAL | Status: DC | PRN

## 2019-07-14 MED ORDER — INSULIN GLARGINE 100 UNIT/ML ~~LOC~~ SOLN
12.0000 [IU] | Freq: Every day | SUBCUTANEOUS | Status: DC
  Administered 2019-07-14 – 2019-07-17 (×4): 12 [IU] via SUBCUTANEOUS
  Filled 2019-07-14 (×5): qty 0.12

## 2019-07-14 MED ORDER — INSULIN ASPART 100 UNIT/ML ~~LOC~~ SOLN
0.0000 [IU] | SUBCUTANEOUS | Status: DC
  Administered 2019-07-14: 1 [IU] via SUBCUTANEOUS
  Administered 2019-07-14 – 2019-07-15 (×4): 2 [IU] via SUBCUTANEOUS
  Administered 2019-07-15: 08:00:00 1 [IU] via SUBCUTANEOUS
  Administered 2019-07-15: 16:00:00 2 [IU] via SUBCUTANEOUS

## 2019-07-14 MED ORDER — ACETAMINOPHEN 650 MG RE SUPP: 650.0000 mg | Freq: Four times a day (QID) | RECTAL | Status: DC | PRN

## 2019-07-14 MED ORDER — SENNOSIDES-DOCUSATE SODIUM 8.6-50 MG PO TABS
1.0000 | ORAL_TABLET | Freq: Two times a day (BID) | ORAL | Status: DC
  Administered 2019-07-14 – 2019-07-17 (×6): 1 via ORAL
  Filled 2019-07-14 (×9): qty 1

## 2019-07-14 MED ORDER — METHOCARBAMOL 500 MG PO TABS: 500.0000 mg | ORAL_TABLET | Freq: Three times a day (TID) | ORAL | Status: DC | PRN

## 2019-07-14 MED ORDER — ROPINIROLE HCL 0.25 MG PO TABS: 0.2500 mg | ORAL_TABLET | ORAL | Status: DC

## 2019-07-14 MED ORDER — SODIUM CHLORIDE 0.9 % IV SOLN: INTRAVENOUS | Status: DC

## 2019-07-14 MED ORDER — ROPINIROLE HCL 1 MG PO TABS
0.5000 mg | ORAL_TABLET | Freq: Every day | ORAL | Status: DC
  Administered 2019-07-14 – 2019-07-17 (×4): 0.5 mg via ORAL
  Filled 2019-07-14 (×3): qty 1

## 2019-07-14 MED ORDER — SODIUM CHLORIDE 0.9% FLUSH
3.0000 mL | Freq: Two times a day (BID) | INTRAVENOUS | Status: DC
  Administered 2019-07-14 – 2019-07-18 (×9): 3 mL via INTRAVENOUS

## 2019-07-14 MED ORDER — ACETAMINOPHEN 325 MG PO TABS: 650.0000 mg | ORAL_TABLET | Freq: Four times a day (QID) | ORAL | Status: DC | PRN

## 2019-07-14 NOTE — Progress Notes (Signed)
[facility] Progress Note     Subjective: CC-  Patient sitting up in bed eating lunch. She reports a mild headache and some lower neck pain. Denies blurry vision, nausea, vomiting, n/t or weakness of any extremity. Left elbow sore but "I forgot it was there." No new injuries noted.   Lives at home independently. Uses walker or cane PRN ambulation. Son lives nearby  Objective: Vital signs in last 24 hours: Temp:  [97.3 F (36.3 C)] 97.3 F (36.3 C) (03/10 2117) Pulse Rate:  [50-103] 67 (03/11 1230) Resp:  [16-23] 18 (03/11 1230) BP: (107-204)/(45-124) 147/95 (03/11 1230) SpO2:  [90 %-100 %] 92 % (03/11 1230) Weight:  [69.4 kg] 69.4 kg (03/10 2130)    Intake/Output from previous day: No intake/output data recorded. Intake/Output this shift: No intake/output data recorded.  PE: Gen:  Alert, NAD, pleasant HEENT: EOM's intact, PERRL Card:  Irregularly irregular, rate controlled, 2+ DP pulses Pulm:  CTAB, no W/R/R, rate and effort normal Abd: Soft, NT/ND, +BS, no HSM Ext:  ACE wrap and sling to LUE, fingers mildly edematous. calves soft and nontender Psych: A&Ox3 Neuro: moving all 4 extremities, no gross motor or sensory deficits Skin: no rashes noted, warm and dry  Lab Results:  Recent Labs    07/13/19 2120 07/14/19 0500  WBC 10.4 10.2  HGB 14.5 12.7  HCT 46.8* 41.1  PLT 264 230   BMET Recent Labs    07/13/19 2120 07/14/19 0500  NA 137 141  K 4.2 3.8  CL 100 103  CO2 26 28  GLUCOSE 157* 124*  BUN 19 18  CREATININE 0.95 0.74  CALCIUM 9.2 8.8*   PT/INR Recent Labs    07/13/19 2156  LABPROT 15.4*  INR 1.2   CMP     Component Value Date/Time   NA 141 07/14/2019 0500   NA 143 06/22/2019 1300   K 3.8 07/14/2019 0500   CL 103 07/14/2019 0500   CO2 28 07/14/2019 0500   GLUCOSE 124 (H) 07/14/2019 0500   BUN 18 07/14/2019 0500   BUN 21 06/22/2019 1300   CREATININE 0.74 07/14/2019 0500   CREATININE 0.77 12/06/2012 1609   CALCIUM 8.8 (L)  07/14/2019 0500   PROT 6.6 07/13/2019 2120   PROT 6.7 06/22/2019 1300   ALBUMIN 3.5 07/13/2019 2120   ALBUMIN 4.2 06/22/2019 1300   AST 26 07/13/2019 2120   ALT 24 07/13/2019 2120   ALKPHOS 48 07/13/2019 2120   BILITOT 0.7 07/13/2019 2120   BILITOT 0.5 06/22/2019 1300   GFRNONAA >60 07/14/2019 0500   GFRNONAA 71 12/06/2012 1609   GFRAA >60 07/14/2019 0500   GFRAA 82 12/06/2012 1609   Lipase  No results found for: LIPASE     Studies/Results: DG Elbow Complete Left  Result Date: 07/13/2019 CLINICAL DATA:  Recent fall with left elbow pain, initial encounter EXAM: LEFT ELBOW - COMPLETE 3+ VIEW COMPARISON:  None. FINDINGS: There is no evidence of fracture, dislocation, or joint effusion. There is no evidence of arthropathy or other focal bone abnormality. Soft tissues are unremarkable. IMPRESSION: No acute abnormality noted. Electronically Signed   By: Inez Catalina M.D.   On: 07/13/2019 22:10   DG Forearm Left  Result Date: 07/13/2019 CLINICAL DATA:  Recent fall with left forearm pain, initial encounter EXAM: LEFT FOREARM - 2 VIEW COMPARISON:  None. FINDINGS: No acute fracture or dislocation is noted. No soft tissue abnormality is noted. Degenerative changes of the radiocarpal joint are seen. Prior posttraumatic changes of  the distal radius and ulna are seen. IMPRESSION: No acute abnormality noted. Electronically Signed   By: Inez Catalina M.D.   On: 07/13/2019 22:12   CT Head Wo Contrast  Result Date: 07/13/2019 CLINICAL DATA:  Unwitnessed mechanical fall EXAM: CT HEAD WITHOUT CONTRAST CT CERVICAL SPINE WITHOUT CONTRAST TECHNIQUE: Multidetector CT imaging of the head and cervical spine was performed following the standard protocol without intravenous contrast. Multiplanar CT image reconstructions of the cervical spine were also generated. COMPARISON:  06/26/2019 FINDINGS: CT HEAD FINDINGS Brain: There is a trace volume of subarachnoid hemorrhage along the para falcine left frontal lobe  (axial series 1, image 15). There is no midline shift. Advanced atrophy and chronic microvascular ischemic changes are noted. There is a small subdural hematoma along the right frontoparietal convexity measuring up to approximately 4 mm in width (coronal series 3, image 39). Vascular: No hyperdense vessel or unexpected calcification. Skull: Normal. Negative for fracture or focal lesion. Sinuses/Orbits: No acute finding. Other: None. CT CERVICAL SPINE FINDINGS Alignment: Normal. Skull base and vertebrae: No acute fracture. No primary bone lesion or focal pathologic process. Soft tissues and spinal canal: No prevertebral fluid or swelling. No visible canal hematoma. Disc levels: Mild multilevel disc degenerative changes are noted, greatest at the C5-C6 and C6-C7 levels. Upper chest: Negative. Other: None IMPRESSION: 1. There is a small volume of acute mixed subdural and subarachnoid hemorrhage as detailed above. There is no evidence for midline shift or mass effect. 2. Advanced atrophy and chronic microvascular ischemic changes are noted. 3. No acute cervical spine fracture. These results were called by telephone at the time of interpretation on 07/13/2019 at 9:50 pm to provider [facility] , who verbally acknowledged these results. Electronically Signed   By: Constance Holster M.D.   On: 07/13/2019 21:53   CT Cervical Spine Wo Contrast  Result Date: 07/13/2019 CLINICAL DATA:  Unwitnessed mechanical fall EXAM: CT HEAD WITHOUT CONTRAST CT CERVICAL SPINE WITHOUT CONTRAST TECHNIQUE: Multidetector CT imaging of the head and cervical spine was performed following the standard protocol without intravenous contrast. Multiplanar CT image reconstructions of the cervical spine were also generated. COMPARISON:  06/26/2019 FINDINGS: CT HEAD FINDINGS Brain: There is a trace volume of subarachnoid hemorrhage along the para falcine left frontal lobe (axial series 1, image 15). There is no midline shift. Advanced atrophy and  chronic microvascular ischemic changes are noted. There is a small subdural hematoma along the right frontoparietal convexity measuring up to approximately 4 mm in width (coronal series 3, image 39). Vascular: No hyperdense vessel or unexpected calcification. Skull: Normal. Negative for fracture or focal lesion. Sinuses/Orbits: No acute finding. Other: None. CT CERVICAL SPINE FINDINGS Alignment: Normal. Skull base and vertebrae: No acute fracture. No primary bone lesion or focal pathologic process. Soft tissues and spinal canal: No prevertebral fluid or swelling. No visible canal hematoma. Disc levels: Mild multilevel disc degenerative changes are noted, greatest at the C5-C6 and C6-C7 levels. Upper chest: Negative. Other: None IMPRESSION: 1. There is a small volume of acute mixed subdural and subarachnoid hemorrhage as detailed above. There is no evidence for midline shift or mass effect. 2. Advanced atrophy and chronic microvascular ischemic changes are noted. 3. No acute cervical spine fracture. These results were called by telephone at the time of interpretation on 07/13/2019 at 9:50 pm to provider [facility] , who verbally acknowledged these results. Electronically Signed   By: Constance Holster M.D.   On: 07/13/2019 21:53   CT Elbow Left Wo Contrast  Result Date: 07/14/2019 CLINICAL DATA:  Elbow trauma sustained during an unwitnessed fall with suspected neurovascular ligamentous and tendinous injury. EXAM: CT OF THE UPPER LEFT EXTREMITY WITHOUT CONTRAST TECHNIQUE: Multidetector CT imaging of the upper left extremity was performed according to the standard protocol. Automatic exposure control utilized. COMPARISON:  None. FINDINGS: Bones/Joint/Cartilage A 4 mm calcific density in the anterior ulnohumeral joint space, likely a loose intra-articular body. CT artifact from patient arm positioning including in the region of the radial head, with possible subtle radial head nondisplaced fracture, sagittal  image 47 and coronal image 31. Nondisplaced cortical avulsion from the olecranon process. No dislocation of the left elbow. Ligaments Suboptimally assessed by CT. Muscles and Tendons Probable thickening and partial-thickness injury of the triceps tendon insertion. Soft tissues Thickening and increased density in the left posterior elbow subcutaneous soft tissues, including in the region of the olecranon bursa. No radiopaque foreign body. No apparent vascular disruption. Elbow joint effusion with elevation of the anterior and posterior fat pads. IMPRESSION: Left elbow joint effusion and possible subtle radial head nondisplaced fracture. More sensitive evaluation with MR left elbow without contrast recommended. Probable partial-thickness injury of the left triceps tendon insertion with nondisplaced small cortical avulsion from the olecranon process. Contusion or edema in the left posterior elbow subcutaneous soft tissues, including in the region of the olecranon bursa. A 4 mm loose intra-articular body in the anterior left ulnohumeral joint. No apparent vascular injury of the left elbow major vasculature. Electronically Signed   By: Revonda Humphrey   On: 07/14/2019 00:10   DG Chest Port 1 View  Result Date: 07/13/2019 CLINICAL DATA:  Recent fall with chest pain, initial encounter EXAM: PORTABLE CHEST 1 VIEW COMPARISON:  06/26/2019 FINDINGS: Cardiac shadow is stable. Aortic calcifications are noted. Lungs are hypoinflated but clear. Postsurgical changes in the left breast are noted. No bony abnormality is seen. IMPRESSION: Poor inspiratory effort without acute abnormality. Electronically Signed   By: Inez Catalina M.D.   On: 07/13/2019 22:24   DG Humerus Left  Result Date: 07/13/2019 CLINICAL DATA:  Recent fall with left arm pain, initial encounter EXAM: LEFT HUMERUS - 2+ VIEW COMPARISON:  None. FINDINGS: Degenerative changes of the glenohumeral articulation are noted. Bony densities are noted adjacent to the  superior aspect of the glenoid likely representing loose bodies. No acute fracture or dislocation is noted. The underlying bony thorax is within normal limits. IMPRESSION: Degenerative changes without acute abnormality. Electronically Signed   By: Inez Catalina M.D.   On: 07/13/2019 22:13    Anti-infectives: Anti-infectives (From admission, onward)   None       Assessment/Plan DM HLD H/o CVA HTN H/o breast cancer Fibromyalgia DNR Atrial fibrillation on eliquis - hold eliquis due to TBI  --above per TRH--  Fall Small left SAH, right SDH - per Dr. Zada Finders, holid eliquis 7 days, no need for repeat CTH, follow clinically L radial head fx - per Dr. Doreatha Martin, NWB LUE, unrestricted ROM, sling PRN comfort  ID - none FEN - HH/CM diet VTE - SCDs Foley - none Follow up - NS, ortho, PCP  Plan - PT/OT. Covid test pending.   LOS: 1 day    Wellington Hampshire, [facility] 07/14/2019, 1:48 PM Please see Amion for pager number during day hours 7:00am-4:30pm

## 2019-07-14 NOTE — Consult Note (Addendum)
Reason for Consult:Ground level fall Referring Physician: Shyan Benway is an 86 y.o. female.  HPI:  Pt is a 86 yo F who had a ground level fall.  She struck head.  She is on eliquis for A fib.  She complains of mild headache and left elbow pain.  She denies LOC.  She did not get dizzy or pass out.  She had a brief episode of nausea but no emesis.  She was found to have a small SAH and therefore eliquis was not reversed.  She is diabetic on insulin and metformin.  She denies low blood sugar.    Past Medical History:  Diagnosis Date  . Arthritis   . Atrial fibrillation ([place])   . Cancer ([place])    breast  . Cataract   . Diabetes mellitus without complication ([place])   . Frequent UTI   . Hyperlipidemia   . Hypertension   . Neck pain   . Scoliosis   . Stroke [facility])     Past Surgical History:  Procedure Laterality Date  . APPENDECTOMY    . BREAST LUMPECTOMY Left   . CHOLECYSTECTOMY    . JOINT REPLACEMENT     bilat knee   . REPLACEMENT TOTAL KNEE BILATERAL Bilateral   . TONSILLECTOMY      Family History  Problem Relation Age of Onset  . Cancer Mother        lung  . Arthritis Mother   . Heart disease Father        endocarditis  . Cancer Brother        lung, throat  . Alcohol abuse Brother     Social History:  reports that she has never smoked. She has never used smokeless tobacco. She reports that she does not drink alcohol or use drugs.  Allergies:  Allergies  Allergen Reactions  . Diltiazem Hcl Itching and Other (See Comments)    Red itchy rash started a few hours after taking short acting 120mg  dilt tabs  . Ace Inhibitors Cough  . Cymbalta [Duloxetine Hcl] Anxiety and Other (See Comments)    Makes patient feel "restless and scared" and unable to sleep    Medications:  Current Meds  Medication Sig  . apixaban (ELIQUIS) 5 MG TABS tablet Take 1 tablet (5 mg total) by mouth 2 (two) times daily.  Marland Kitchen aspirin EC 81 MG EC tablet Take 1 tablet (81 mg total) by  mouth daily.  Marland Kitchen atorvastatin (LIPITOR) 80 MG tablet Take 1 tablet (80 mg total) by mouth daily at 6 PM. (Patient taking differently: Take 80 mg by mouth at bedtime. )  . butalbital-aspirin-caffeine (FIORINAL) 50-325-40 MG capsule Take 1 capsule by mouth every 6 (six) hours as needed for headache.   . Cholecalciferol (VITAMIN D3) 50 MCG (2000 UT) TABS Take 2,000 Units by mouth in the morning.  . DULoxetine (CYMBALTA) 20 MG capsule Take 1 capsule (20 mg total) by mouth daily.  . hydrALAZINE (APRESOLINE) 50 MG tablet Take 1 tablet (50 mg total) by mouth 2 (two) times daily.  Marland Kitchen LANTUS SOLOSTAR 100 UNIT/ML Solostar Pen INJECT 50 UNITS ONCE DAILY AT 10PM (Patient taking differently: Inject 50 Units into the skin at bedtime. )  . Lidocaine 4 % PTCH Apply 1 patch topically See admin instructions. Apply 1 patch to each hip daily for pain- on 12 hrs/off 12 hrs  . Magnesium 250 MG TABS Take 250 mg by mouth daily.  . metFORMIN (GLUCOPHAGE) 500 MG tablet TAKE (1)  TABLET DAILY WITH BREAKFAST. (Patient taking differently: Take 500 mg by mouth daily with breakfast. )  . omeprazole (PRILOSEC) 20 MG capsule TAKE  (1)  CAPSULE  TWICE DAILY (TAKE ON AN EMPTY STOMACH AT LEAST 30 MIN- UTES BEFORE MEALS). (Patient taking differently: Take 20 mg by mouth 2 (two) times daily before a meal. )  . ondansetron (ZOFRAN) 4 MG tablet TAKE 1 TABLET EVERY 8 HOURS AS NEEDED FOR NAUSEA & VOMITING (Patient taking differently: Take 4 mg by mouth every 8 (eight) hours as needed for nausea or vomiting. )  . potassium chloride (KLOR-CON) 10 MEQ tablet Take 1 tablet (10 mEq total) by mouth daily. (Patient taking differently: Take 10 mEq by mouth in the morning. )  . rOPINIRole (REQUIP) 0.25 MG tablet Take 1 tablet (0.25 mg total) by mouth 3 (three) times daily. Take one tablet in the morning, one tablet at noon and two tablets at bedtime. (Patient taking differently: Take 0.25 mg by mouth See admin instructions. Take 0.25 mg by mouth two times  a day and 0.25 mg at bedtime)  . SUPER B COMPLEX/C PO Take 1 tablet by mouth daily.     Results for orders placed or performed during the hospital encounter of 07/13/19 (from the past 48 hour(s))  Comprehensive metabolic panel     Status: Abnormal   Collection Time: 07/13/19  9:20 PM  Result Value Ref Range   Sodium 137 135 - 145 mmol/L   Potassium 4.2 3.5 - 5.1 mmol/L   Chloride 100 98 - 111 mmol/L   CO2 26 22 - 32 mmol/L   Glucose, Bld 157 (H) 70 - 99 mg/dL    Comment: Glucose reference range applies only to samples taken after fasting for at least 8 hours.   BUN 19 8 - 23 mg/dL   Creatinine, Ser 0.95 0.44 - 1.00 mg/dL   Calcium 9.2 8.9 - 10.3 mg/dL   Total Protein 6.6 6.5 - 8.1 g/dL   Albumin 3.5 3.5 - 5.0 g/dL   AST 26 15 - 41 U/L   ALT 24 0 - 44 U/L   Alkaline Phosphatase 48 38 - 126 U/L   Total Bilirubin 0.7 0.3 - 1.2 mg/dL   GFR calc non Af Amer 52 (L) >60 mL/min   GFR calc Af Amer >60 >60 mL/min   Anion gap 11 5 - 15    Comment: Performed at [facility], [place] [address] [place], [place] [address]  CBC with Differential     Status: Abnormal   Collection Time: 07/13/19  9:20 PM  Result Value Ref Range   WBC 10.4 4.0 - 10.5 K/uL   RBC 5.55 (H) 3.87 - 5.11 MIL/uL   Hemoglobin 14.5 12.0 - 15.0 g/dL   HCT 46.8 (H) 36.0 - 46.0 %   MCV 84.3 80.0 - 100.0 fL   MCH 26.1 26.0 - 34.0 pg   MCHC 31.0 30.0 - 36.0 g/dL   RDW 17.6 (H) 11.5 - 15.5 %   Platelets 264 150 - 400 K/uL   nRBC 0.0 0.0 - 0.2 %   Neutrophils Relative % 74 %   Neutro Abs 7.8 (H) 1.7 - 7.7 K/uL   Lymphocytes Relative 12 %   Lymphs Abs 1.2 0.7 - 4.0 K/uL   Monocytes Relative 10 %   Monocytes Absolute 1.0 0.1 - 1.0 K/uL   Eosinophils Relative 3 %   Eosinophils Absolute 0.3 0.0 - 0.5 K/uL   Basophils Relative 1 %  Basophils Absolute 0.1 0.0 - 0.1 K/uL   Immature Granulocytes 0 %   Abs Immature Granulocytes 0.03 0.00 - 0.07 K/uL    Comment: Performed at [facility], [place] [address],  [place], [place] [address]  Protime-INR     Status: Abnormal   Collection Time: 07/13/19  9:56 PM  Result Value Ref Range   Prothrombin Time 15.4 (H) 11.4 - 15.2 seconds   INR 1.2 0.8 - 1.2    Comment: (NOTE) INR goal varies based on device and disease states. Performed at [facility], [place] [address], [place], [place] [address]   Type and screen [place]     Status: None   Collection Time: 07/13/19 10:10 PM  Result Value Ref Range   ABO/RH(D) A POS    Antibody Screen NEG    Sample Expiration      07/16/2019,2359 Performed at [facility], [place] [address] [place], [place] [address]   CBG monitoring, ED     Status: Abnormal   Collection Time: 07/14/19  3:20 AM  Result Value Ref Range   Glucose-Capillary 162 (H) 70 - 99 mg/dL    Comment: Glucose reference range applies only to samples taken after fasting for at least 8 hours.    DG Elbow Complete Left  Result Date: 07/13/2019 CLINICAL DATA:  Recent fall with left elbow pain, initial encounter EXAM: LEFT ELBOW - COMPLETE 3+ VIEW COMPARISON:  None. FINDINGS: There is no evidence of fracture, dislocation, or joint effusion. There is no evidence of arthropathy or other focal bone abnormality. Soft tissues are unremarkable. IMPRESSION: No acute abnormality noted. Electronically Signed   By: Inez Catalina M.D.   On: 07/13/2019 22:10   DG Forearm Left  Result Date: 07/13/2019 CLINICAL DATA:  Recent fall with left forearm pain, initial encounter EXAM: LEFT FOREARM - 2 VIEW COMPARISON:  None. FINDINGS: No acute fracture or dislocation is noted. No soft tissue abnormality is noted. Degenerative changes of the radiocarpal joint are seen. Prior posttraumatic changes of the distal radius and ulna are seen. IMPRESSION: No acute abnormality noted. Electronically Signed   By: Inez Catalina M.D.   On: 07/13/2019 22:12   CT Head Wo Contrast  Result Date: 07/13/2019 CLINICAL DATA:  Unwitnessed mechanical fall EXAM: CT HEAD  WITHOUT CONTRAST CT CERVICAL SPINE WITHOUT CONTRAST TECHNIQUE: Multidetector CT imaging of the head and cervical spine was performed following the standard protocol without intravenous contrast. Multiplanar CT image reconstructions of the cervical spine were also generated. COMPARISON:  06/26/2019 FINDINGS: CT HEAD FINDINGS Brain: There is a trace volume of subarachnoid hemorrhage along the para falcine left frontal lobe (axial series 1, image 15). There is no midline shift. Advanced atrophy and chronic microvascular ischemic changes are noted. There is a small subdural hematoma along the right frontoparietal convexity measuring up to approximately 4 mm in width (coronal series 3, image 39). Vascular: No hyperdense vessel or unexpected calcification. Skull: Normal. Negative for fracture or focal lesion. Sinuses/Orbits: No acute finding. Other: None. CT CERVICAL SPINE FINDINGS Alignment: Normal. Skull base and vertebrae: No acute fracture. No primary bone lesion or focal pathologic process. Soft tissues and spinal canal: No prevertebral fluid or swelling. No visible canal hematoma. Disc levels: Mild multilevel disc degenerative changes are noted, greatest at the C5-C6 and C6-C7 levels. Upper chest: Negative. Other: None IMPRESSION: 1. There is a small volume of acute mixed subdural and subarachnoid hemorrhage as detailed above. There is no evidence for  midline shift or mass effect. 2. Advanced atrophy and chronic microvascular ischemic changes are noted. 3. No acute cervical spine fracture. These results were called by telephone at the time of interpretation on 07/13/2019 at 9:50 pm to provider [facility] , who verbally acknowledged these results. Electronically Signed   By: Constance Holster M.D.   On: 07/13/2019 21:53   CT Cervical Spine Wo Contrast  Result Date: 07/13/2019 CLINICAL DATA:  Unwitnessed mechanical fall EXAM: CT HEAD WITHOUT CONTRAST CT CERVICAL SPINE WITHOUT CONTRAST TECHNIQUE: Multidetector  CT imaging of the head and cervical spine was performed following the standard protocol without intravenous contrast. Multiplanar CT image reconstructions of the cervical spine were also generated. COMPARISON:  06/26/2019 FINDINGS: CT HEAD FINDINGS Brain: There is a trace volume of subarachnoid hemorrhage along the para falcine left frontal lobe (axial series 1, image 15). There is no midline shift. Advanced atrophy and chronic microvascular ischemic changes are noted. There is a small subdural hematoma along the right frontoparietal convexity measuring up to approximately 4 mm in width (coronal series 3, image 39). Vascular: No hyperdense vessel or unexpected calcification. Skull: Normal. Negative for fracture or focal lesion. Sinuses/Orbits: No acute finding. Other: None. CT CERVICAL SPINE FINDINGS Alignment: Normal. Skull base and vertebrae: No acute fracture. No primary bone lesion or focal pathologic process. Soft tissues and spinal canal: No prevertebral fluid or swelling. No visible canal hematoma. Disc levels: Mild multilevel disc degenerative changes are noted, greatest at the C5-C6 and C6-C7 levels. Upper chest: Negative. Other: None IMPRESSION: 1. There is a small volume of acute mixed subdural and subarachnoid hemorrhage as detailed above. There is no evidence for midline shift or mass effect. 2. Advanced atrophy and chronic microvascular ischemic changes are noted. 3. No acute cervical spine fracture. These results were called by telephone at the time of interpretation on 07/13/2019 at 9:50 pm to provider [facility] , who verbally acknowledged these results. Electronically Signed   By: Constance Holster M.D.   On: 07/13/2019 21:53   CT Elbow Left Wo Contrast  Result Date: 07/14/2019 CLINICAL DATA:  Elbow trauma sustained during an unwitnessed fall with suspected neurovascular ligamentous and tendinous injury. EXAM: CT OF THE UPPER LEFT EXTREMITY WITHOUT CONTRAST TECHNIQUE: Multidetector CT  imaging of the upper left extremity was performed according to the standard protocol. Automatic exposure control utilized. COMPARISON:  None. FINDINGS: Bones/Joint/Cartilage A 4 mm calcific density in the anterior ulnohumeral joint space, likely a loose intra-articular body. CT artifact from patient arm positioning including in the region of the radial head, with possible subtle radial head nondisplaced fracture, sagittal image 47 and coronal image 31. Nondisplaced cortical avulsion from the olecranon process. No dislocation of the left elbow. Ligaments Suboptimally assessed by CT. Muscles and Tendons Probable thickening and partial-thickness injury of the triceps tendon insertion. Soft tissues Thickening and increased density in the left posterior elbow subcutaneous soft tissues, including in the region of the olecranon bursa. No radiopaque foreign body. No apparent vascular disruption. Elbow joint effusion with elevation of the anterior and posterior fat pads. IMPRESSION: Left elbow joint effusion and possible subtle radial head nondisplaced fracture. More sensitive evaluation with MR left elbow without contrast recommended. Probable partial-thickness injury of the left triceps tendon insertion with nondisplaced small cortical avulsion from the olecranon process. Contusion or edema in the left posterior elbow subcutaneous soft tissues, including in the region of the olecranon bursa. A 4 mm loose intra-articular body in the anterior left ulnohumeral joint. No apparent vascular injury  of the left elbow major vasculature. Electronically Signed   By: Revonda Humphrey   On: 07/14/2019 00:10   DG Chest Port 1 View  Result Date: 07/13/2019 CLINICAL DATA:  Recent fall with chest pain, initial encounter EXAM: PORTABLE CHEST 1 VIEW COMPARISON:  06/26/2019 FINDINGS: Cardiac shadow is stable. Aortic calcifications are noted. Lungs are hypoinflated but clear. Postsurgical changes in the left breast are noted. No bony  abnormality is seen. IMPRESSION: Poor inspiratory effort without acute abnormality. Electronically Signed   By: Inez Catalina M.D.   On: 07/13/2019 22:24   DG Humerus Left  Result Date: 07/13/2019 CLINICAL DATA:  Recent fall with left arm pain, initial encounter EXAM: LEFT HUMERUS - 2+ VIEW COMPARISON:  None. FINDINGS: Degenerative changes of the glenohumeral articulation are noted. Bony densities are noted adjacent to the superior aspect of the glenoid likely representing loose bodies. No acute fracture or dislocation is noted. The underlying bony thorax is within normal limits. IMPRESSION: Degenerative changes without acute abnormality. Electronically Signed   By: Inez Catalina M.D.   On: 07/13/2019 22:13    Review of Systems  Constitutional: Negative.   HENT: Negative.   Eyes: Negative.   Respiratory: Negative.   Cardiovascular: Negative.   Gastrointestinal: Negative.   Endocrine: Negative.   Genitourinary: Negative.   Musculoskeletal: Positive for joint swelling (left elbow).  Allergic/Immunologic: Negative.   Neurological: Negative.   Hematological: Negative.   Psychiatric/Behavioral: Negative.   All other systems reviewed and are negative.  Blood pressure 137/66, pulse 69, temperature (!) 97.3 F (36.3 C), temperature source Temporal, resp. rate 20, height 4\' 11"  (1.499 m), weight 69.4 kg, SpO2 98 %. Physical Exam  Constitutional: She is oriented to person, place, and time. She appears well-developed and well-nourished. No distress.  HENT:  Head: Normocephalic.  Right Ear: External ear normal.  Left Ear: External ear normal.  Mouth/Throat: Oropharynx is clear and moist. No oropharyngeal exudate.  Eyes: Pupils are equal, round, and reactive to light. Conjunctivae are normal.  Neck: No JVD present. No tracheal deviation present. No thyromegaly present.  Cardiovascular: Normal rate, normal heart sounds and intact distal pulses.  irreg  Respiratory: Effort normal and breath sounds  normal. No respiratory distress. She has no wheezes. She has no rales. She exhibits no tenderness.  GI: Soft. Bowel sounds are normal. She exhibits no distension and no mass. There is no abdominal tenderness. There is no rebound and no guarding.  Musculoskeletal:        General: Tenderness (left elbow) and edema (left elbow) present.     Cervical back: Neck supple.  Lymphadenopathy:    She has no cervical adenopathy.  Neurological: She is alert and oriented to person, place, and time. No cranial nerve deficit. Coordination normal.  Skin: Skin is warm and dry. No rash noted. She is not diaphoretic. No erythema. No pallor.  Chronic vascular changes on bilateral feet/lower calves.    Psychiatric: She has a normal mood and affect. Her behavior is normal. Judgment and thought content normal.    Assessment/Plan: Fall Small left SAH, right SDH.   Possible left elbow fracture Anticoagulated with eliquis.    DM H/o CVA THN H/o breast cancer fibromyalgia  Pt admitted to medicine for ground level fall.   Will need continued workup for left elbow pain.  Pt currently unable to tolerate MRI.  Will have ortho consult and see if they have enough information from CT.   Pulmonary toilet. Dr. Zada Finders has evaluated.  Only repeat  head CT if neuro changes occur.  Hold eliquis for now.     Stark Klein 07/14/2019, 3:51 AM

## 2019-07-14 NOTE — H&P (Signed)
History and Physical    [name] [id number] DOB: 1926-03-23 DOA: 07/13/2019  PCP: Baruch Gouty, FNP   Patient coming from: SNF   Chief Complaint: Fall with left elbow pain   HPI: [name] is a 86 y.o. female with medical history significant for atrial fibrillation on Eliquis, insulin-dependent diabetes mellitus, hypertension, and admission last month with acute ischemic CVA, now presenting from her SNF with left elbow pain and head injury after a ground-level fall.  Patient reports that she had been in usual state of health, the floor the nursing facility had just been cleaned and she suspects that it was still wet, causing her to slip.  She is not sure if she landed on her outstretched left arm or the left elbow, but also hit the back.  She denies any loss of consciousness.  Hips or lower extremities she notes neck pain with motion but denies any back pain.  She denies any recent fevers, chills, shortness of breath.  She is chest pain or palpitations.  Reports that she has had both doses of the Covid vaccine.  ED Course: Upon arrival to the ED, patient is found to be afebrile, saturating low to mid 90s on room air, and hypertensive with systolic pressure [id number] EKG features atrial fibrillation with PVC.  Chest x-ray negative for acute findings.  Radiographs were not humerus, elbow, and forearm CT of the head is concerning for swallowing acute subdural and subarachnoid hemorrhage along the left parafalcine frontal lobe without mass-effect or midline shift.  No acute fracture noted on cervical spine CT.  Neurosurgery was consulted by ED physician, who recommended anticoagulation need to be reversed.  ED physician also spoke with trauma surgery clinician.  Patient was given analgesia in the ED, COVID-19 screening test not yet resulted, and CT left elbow has not yet been read.  Review of Systems:  All other systems reviewed and apart from HPI, are negative.  Past Medical History:    Diagnosis Date  . Arthritis   . Atrial fibrillation ([place])   . Cancer ([place])    breast  . Cataract   . Diabetes mellitus without complication ([place])   . Frequent UTI   . Hyperlipidemia   . Hypertension   . Neck pain   . Scoliosis   . Stroke [facility])     Past Surgical History:  Procedure Laterality Date  . APPENDECTOMY    . BREAST LUMPECTOMY Left   . CHOLECYSTECTOMY    . JOINT REPLACEMENT     bilat knee   . REPLACEMENT TOTAL KNEE BILATERAL Bilateral   . TONSILLECTOMY       reports that she has never smoked. She has never used smokeless tobacco. She reports that she does not drink alcohol or use drugs.  Allergies  Allergen Reactions  . Diltiazem Hcl Itching and Other (See Comments)    Red itchy rash started a few hours after taking short acting 120mg  dilt tabs  . Ace Inhibitors Cough  . Cymbalta [Duloxetine Hcl] Anxiety and Other (See Comments)    Makes patient feel "restless and scared" and unable to sleep    Family History  Problem Relation Age of Onset  . Cancer Mother        lung  . Arthritis Mother   . Heart disease Father        endocarditis  . Cancer Brother        lung, throat  . Alcohol abuse Brother      Prior  to Admission medications   Medication Sig Start Date End Date Taking? Authorizing Provider  apixaban (ELIQUIS) 5 MG TABS tablet Take 1 tablet (5 mg total) by mouth 2 (two) times daily. 07/27/18  Yes Baruch Gouty, FNP  aspirin EC 81 MG EC tablet Take 1 tablet (81 mg total) by mouth daily. 07/01/19  Yes Charlynne Cousins, MD  atorvastatin (LIPITOR) 80 MG tablet Take 1 tablet (80 mg total) by mouth daily at 6 PM. Patient taking differently: Take 80 mg by mouth at bedtime.  07/01/19  Yes Charlynne Cousins, MD  butalbital-aspirin-caffeine [facility]) [phone] MG capsule Take 1 capsule by mouth every 6 (six) hours as needed for headache.  07/13/19  Yes [provider]  Cholecalciferol (VITAMIN D3) 50 MCG (2000 UT) TABS Take 2,000 Units by  mouth in the morning.   Yes [provider]  DULoxetine (CYMBALTA) 20 MG capsule Take 1 capsule (20 mg total) by mouth daily. 05/25/19 07/13/19 Yes Rakes, Connye Burkitt, FNP  hydrALAZINE (APRESOLINE) 50 MG tablet Take 1 tablet (50 mg total) by mouth 2 (two) times daily. 06/23/19 07/23/19 Yes Rakes, Connye Burkitt, FNP  LANTUS SOLOSTAR 100 UNIT/ML Solostar Pen INJECT 50 UNITS ONCE DAILY AT 10PM Patient taking differently: Inject 50 Units into the skin at bedtime.  06/18/19  Yes Rakes, Connye Burkitt, FNP  Lidocaine 4 % PTCH Apply 1 patch topically See admin instructions. Apply 1 patch to each hip daily for pain- on 12 hrs/off 12 hrs   Yes [provider]  Magnesium 250 MG TABS Take 250 mg by mouth daily.   Yes [provider]  metFORMIN (GLUCOPHAGE) 500 MG tablet TAKE (1) TABLET DAILY WITH BREAKFAST. Patient taking differently: Take 500 mg by mouth daily with breakfast.  03/18/19  Yes Rakes, Connye Burkitt, FNP  omeprazole (PRILOSEC) 20 MG capsule TAKE  (1)  CAPSULE  TWICE DAILY (TAKE ON AN EMPTY STOMACH AT LEAST 30 MIN- UTES BEFORE MEALS). Patient taking differently: Take 20 mg by mouth 2 (two) times daily before a meal.  05/23/19  Yes Rakes, Connye Burkitt, FNP  ondansetron (ZOFRAN) 4 MG tablet TAKE 1 TABLET EVERY 8 HOURS AS NEEDED FOR NAUSEA & VOMITING Patient taking differently: Take 4 mg by mouth every 8 (eight) hours as needed for nausea or vomiting.  06/20/19  Yes Rakes, Connye Burkitt, FNP  potassium chloride (KLOR-CON) 10 MEQ tablet Take 1 tablet (10 mEq total) by mouth daily. Patient taking differently: Take 10 mEq by mouth in the morning.  06/20/19  Yes Rakes, Connye Burkitt, FNP  rOPINIRole (REQUIP) 0.25 MG tablet Take 1 tablet (0.25 mg total) by mouth 3 (three) times daily. Take one tablet in the morning, one tablet at noon and two tablets at bedtime. Patient taking differently: Take 0.25 mg by mouth See admin instructions. Take 0.25 mg by mouth two times a day and 0.25 mg at bedtime 06/23/19 07/23/19 Yes Rakes, Connye Burkitt, FNP  SUPER B COMPLEX/C PO Take 1 tablet by mouth daily.   Yes [provider]  glucose blood (ONE TOUCH ULTRA TEST) test strip USE TO check blood sugars twice daily 10/30/17   Claretta Fraise, MD  hydrALAZINE (APRESOLINE) 25 MG tablet Take 1 tablet (25 mg total) by mouth 2 (two) times daily. Patient not taking: Reported on 07/13/2019 06/23/19 07/23/19  Baruch Gouty, FNP  Insulin Pen Needle 31G X 8 MM MISC Use once daily with lantus solostar 10/30/17   Claretta Fraise, MD  nabumetone (RELAFEN) 500 MG tablet  Take 1 tablet (500 mg total) by mouth 2 (two) times daily. For muscleand joint pain Patient not taking: No sig reported 12/16/18   Claretta Fraise, MD  Insulin Glargine (LANTUS SOLOSTAR) 100 UNIT/ML Solostar Pen INJECT 50 UNITS ONCE DAILY AT 10PM 11/01/18   Baruch Gouty, FNP    Physical Exam: Vitals:   07/13/19 2130 07/13/19 2245 07/13/19 2300 07/13/19 2330  BP:  (!) 190/93 (!) 170/88 (!) 157/86  Pulse:  80 83 62  Resp:  (!) 23 19 18   Temp:      TempSrc:      SpO2:  91% 95% 92%  Weight: 69.4 kg     Height: 4\' 11"  (1.499 m)       Constitutional: NAD, calm  Eyes: PERTLA, lids and conjunctivae normal ENMT: Mucous membranes are moist. Posterior pharynx clear of any exudate or lesions.   Neck: normal, supple, no masses, no thyromegaly Respiratory:  no wheezing, no crackles. No accessory muscle use.  Cardiovascular: Irregularly irregular with normal rate. No extremity edema. No significant JVD. Abdomen: No distension, no tenderness, soft. Bowel sounds active.  Musculoskeletal: no clubbing / cyanosis. Left elbow tender with ROM limited by pain; neurovascularly intact.   Skin: no significant rashes, lesions, ulcers noted. Warm, dry, well-perfused. Neurologic: CN 2-12 grossly intact. Sensation intact. Strength 5/5 in all 4 limbs.  Psychiatric: Alert and oriented x 3. Very pleasant and cooperative.    Labs and Imaging on Admission: I have personally reviewed following labs and  imaging studies  CBC: Recent Labs  Lab 07/13/19 2120  WBC 10.4  NEUTROABS 7.8*  HGB 14.5  HCT 46.8*  MCV 84.3  PLT [id number]   Basic Metabolic Panel: Recent Labs  Lab 07/13/19 2120  NA 137  K 4.2  CL 100  CO2 26  GLUCOSE 157*  BUN 19  CREATININE 0.95  CALCIUM 9.2   GFR: Estimated Creatinine Clearance: 32.7 mL/min (by C-G formula based on SCr of 0.95 mg/dL). Liver Function Tests: Recent Labs  Lab 07/13/19 2120  AST 26  ALT 24  ALKPHOS 48  BILITOT 0.7  PROT 6.6  ALBUMIN 3.5   No results for input(s): LIPASE, AMYLASE in the last 168 hours. No results for input(s): AMMONIA in the last 168 hours. Coagulation Profile: Recent Labs  Lab 07/13/19 2156  INR 1.2   Cardiac Enzymes: No results for input(s): CKTOTAL, CKMB, CKMBINDEX, TROPONINI in the last 168 hours. BNP (last 3 results) No results for input(s): PROBNP in the last 8760 hours. HbA1C: No results for input(s): HGBA1C in the last 72 hours. CBG: No results for input(s): GLUCAP in the last 168 hours. Lipid Profile: No results for input(s): CHOL, HDL, LDLCALC, TRIG, CHOLHDL, LDLDIRECT in the last 72 hours. Thyroid Function Tests: No results for input(s): TSH, T4TOTAL, FREET4, T3FREE, THYROIDAB in the last 72 hours. Anemia Panel: No results for input(s): VITAMINB12, FOLATE, FERRITIN, TIBC, IRON, RETICCTPCT in the last 72 hours. Urine analysis:    Component Value Date/Time   COLORURINE YELLOW 06/26/2019 1547   APPEARANCEUR HAZY (A) 06/26/2019 1547   APPEARANCEUR Clear 03/04/2019 1702   LABSPEC 1.021 06/26/2019 1547   PHURINE 6.0 06/26/2019 1547   GLUCOSEU NEGATIVE 06/26/2019 1547   HGBUR NEGATIVE 06/26/2019 1547   BILIRUBINUR NEGATIVE 06/26/2019 1547   BILIRUBINUR Negative 03/04/2019 1702   KETONESUR NEGATIVE 06/26/2019 1547   PROTEINUR 100 (A) 06/26/2019 1547   UROBILINOGEN negative 07/03/2015 1351   UROBILINOGEN 1.0 05/14/2007 1320   NITRITE NEGATIVE 06/26/2019 1547   LEUKOCYTESUR  NEGATIVE 06/26/2019  1547   Sepsis Labs: @LABRCNTIP (procalcitonin:4,lacticidven:4) )No results found for this or any previous visit (from the past 240 hour(s)).   Radiological Exams on Admission: DG Elbow Complete Left  Result Date: 07/13/2019 CLINICAL DATA:  Recent fall with left elbow pain, initial encounter EXAM: LEFT ELBOW - COMPLETE 3+ VIEW COMPARISON:  None. FINDINGS: There is no evidence of fracture, dislocation, or joint effusion. There is no evidence of arthropathy or other focal bone abnormality. Soft tissues are unremarkable. IMPRESSION: No acute abnormality noted. Electronically Signed   By: Inez Catalina M.D.   On: 07/13/2019 22:10   DG Forearm Left  Result Date: 07/13/2019 CLINICAL DATA:  Recent fall with left forearm pain, initial encounter EXAM: LEFT FOREARM - 2 VIEW COMPARISON:  None. FINDINGS: No acute fracture or dislocation is noted. No soft tissue abnormality is noted. Degenerative changes of the radiocarpal joint are seen. Prior posttraumatic changes of the distal radius and ulna are seen. IMPRESSION: No acute abnormality noted. Electronically Signed   By: Inez Catalina M.D.   On: 07/13/2019 22:12   CT Head Wo Contrast  Result Date: 07/13/2019 CLINICAL DATA:  Unwitnessed mechanical fall EXAM: CT HEAD WITHOUT CONTRAST CT CERVICAL SPINE WITHOUT CONTRAST TECHNIQUE: Multidetector CT imaging of the head and cervical spine was performed following the standard protocol without intravenous contrast. Multiplanar CT image reconstructions of the cervical spine were also generated. COMPARISON:  06/26/2019 FINDINGS: CT HEAD FINDINGS Brain: There is a trace volume of subarachnoid hemorrhage along the para falcine left frontal lobe (axial series 1, image 15). There is no midline shift. Advanced atrophy and chronic microvascular ischemic changes are noted. There is a small subdural hematoma along the right frontoparietal convexity measuring up to approximately 4 mm in width (coronal series 3, image 39). Vascular: No  hyperdense vessel or unexpected calcification. Skull: Normal. Negative for fracture or focal lesion. Sinuses/Orbits: No acute finding. Other: None. CT CERVICAL SPINE FINDINGS Alignment: Normal. Skull base and vertebrae: No acute fracture. No primary bone lesion or focal pathologic process. Soft tissues and spinal canal: No prevertebral fluid or swelling. No visible canal hematoma. Disc levels: Mild multilevel disc degenerative changes are noted, greatest at the C5-C6 and C6-C7 levels. Upper chest: Negative. Other: None IMPRESSION: 1. There is a small volume of acute mixed subdural and subarachnoid hemorrhage as detailed above. There is no evidence for midline shift or mass effect. 2. Advanced atrophy and chronic microvascular ischemic changes are noted. 3. No acute cervical spine fracture. These results were called by telephone at the time of interpretation on 07/13/2019 at 9:50 pm to provider [facility] , who verbally acknowledged these results. Electronically Signed   By: Constance Holster M.D.   On: 07/13/2019 21:53   CT Cervical Spine Wo Contrast  Result Date: 07/13/2019 CLINICAL DATA:  Unwitnessed mechanical fall EXAM: CT HEAD WITHOUT CONTRAST CT CERVICAL SPINE WITHOUT CONTRAST TECHNIQUE: Multidetector CT imaging of the head and cervical spine was performed following the standard protocol without intravenous contrast. Multiplanar CT image reconstructions of the cervical spine were also generated. COMPARISON:  06/26/2019 FINDINGS: CT HEAD FINDINGS Brain: There is a trace volume of subarachnoid hemorrhage along the para falcine left frontal lobe (axial series 1, image 15). There is no midline shift. Advanced atrophy and chronic microvascular ischemic changes are noted. There is a small subdural hematoma along the right frontoparietal convexity measuring up to approximately 4 mm in width (coronal series 3, image 39). Vascular: No hyperdense vessel or unexpected calcification. Skull: Normal. Negative  for  fracture or focal lesion. Sinuses/Orbits: No acute finding. Other: None. CT CERVICAL SPINE FINDINGS Alignment: Normal. Skull base and vertebrae: No acute fracture. No primary bone lesion or focal pathologic process. Soft tissues and spinal canal: No prevertebral fluid or swelling. No visible canal hematoma. Disc levels: Mild multilevel disc degenerative changes are noted, greatest at the C5-C6 and C6-C7 levels. Upper chest: Negative. Other: None IMPRESSION: 1. There is a small volume of acute mixed subdural and subarachnoid hemorrhage as detailed above. There is no evidence for midline shift or mass effect. 2. Advanced atrophy and chronic microvascular ischemic changes are noted. 3. No acute cervical spine fracture. These results were called by telephone at the time of interpretation on 07/13/2019 at 9:50 pm to provider [facility] , who verbally acknowledged these results. Electronically Signed   By: Constance Holster M.D.   On: 07/13/2019 21:53   CT Elbow Left Wo Contrast  Result Date: 07/14/2019 CLINICAL DATA:  Elbow trauma sustained during an unwitnessed fall with suspected neurovascular ligamentous and tendinous injury. EXAM: CT OF THE UPPER LEFT EXTREMITY WITHOUT CONTRAST TECHNIQUE: Multidetector CT imaging of the upper left extremity was performed according to the standard protocol. Automatic exposure control utilized. COMPARISON:  None. FINDINGS: Bones/Joint/Cartilage A 4 mm calcific density in the anterior ulnohumeral joint space, likely a loose intra-articular body. CT artifact from patient arm positioning including in the region of the radial head, with possible subtle radial head nondisplaced fracture, sagittal image 47 and coronal image 31. Nondisplaced cortical avulsion from the olecranon process. No dislocation of the left elbow. Ligaments Suboptimally assessed by CT. Muscles and Tendons Probable thickening and partial-thickness injury of the triceps tendon insertion. Soft tissues Thickening  and increased density in the left posterior elbow subcutaneous soft tissues, including in the region of the olecranon bursa. No radiopaque foreign body. No apparent vascular disruption. Elbow joint effusion with elevation of the anterior and posterior fat pads. IMPRESSION: Left elbow joint effusion and possible subtle radial head nondisplaced fracture. More sensitive evaluation with MR left elbow without contrast recommended. Probable partial-thickness injury of the left triceps tendon insertion with nondisplaced small cortical avulsion from the olecranon process. Contusion or edema in the left posterior elbow subcutaneous soft tissues, including in the region of the olecranon bursa. A 4 mm loose intra-articular body in the anterior left ulnohumeral joint. No apparent vascular injury of the left elbow major vasculature. Electronically Signed   By: Revonda Humphrey   On: 07/14/2019 00:10   DG Chest Port 1 View  Result Date: 07/13/2019 CLINICAL DATA:  Recent fall with chest pain, initial encounter EXAM: PORTABLE CHEST 1 VIEW COMPARISON:  06/26/2019 FINDINGS: Cardiac shadow is stable. Aortic calcifications are noted. Lungs are hypoinflated but clear. Postsurgical changes in the left breast are noted. No bony abnormality is seen. IMPRESSION: Poor inspiratory effort without acute abnormality. Electronically Signed   By: Inez Catalina M.D.   On: 07/13/2019 22:24   DG Humerus Left  Result Date: 07/13/2019 CLINICAL DATA:  Recent fall with left arm pain, initial encounter EXAM: LEFT HUMERUS - 2+ VIEW COMPARISON:  None. FINDINGS: Degenerative changes of the glenohumeral articulation are noted. Bony densities are noted adjacent to the superior aspect of the glenoid likely representing loose bodies. No acute fracture or dislocation is noted. The underlying bony thorax is within normal limits. IMPRESSION: Degenerative changes without acute abnormality. Electronically Signed   By: Inez Catalina M.D.   On: 07/13/2019 22:13     EKG: Independently reviewed. Atrial fibrillation,  PVC.   Assessment/Plan   1. Subarachnoid hemorrhage and SDH, traumatic  - Presents after ground-level fall while on Eliquis, hit back of head on ground but denies LOC - CT demonstrates small-volume mixed SDH and SAH along left parafalcine frontal lobe  - Neurosurgery consulted by ED physician and much appreciated  - Continue neuro checks, BP-control, stool softener, follow-up NSG recommendations   2. Left elbow pain  - Plain films negative for acute fracture but CT suggests possible radial head fracture, will check MRI    3. Atrial fibrillation  - Rate is controlled in ED  - Hold Eliquis, does not need to be reversed per NSG   4. Hypertension with hypertensive urgency  - BP elevated in ED with pain likely contributing  - Continue pain-control, continue hydralazine, use labetalol IVPs as needed    5. Insulin-dependent DM  - A1c was 6.3% in February 2021   - Continue insulin   6. History of CVA  - Continue statin, hold ASA for now    DVT prophylaxis: Eliquis pta, held on admission  Code Status: DNR, confirmed with patient  Family Communication: Discussed with patient  Disposition Plan: Likely back to SNF once cleared by neurosurgery  Consults called: Neurosurgery and trauma consulted by ED physician  Admission status: Inpatient     Vianne Bulls, MD [facility] Pager: See [email or web address]  If 7AM-7PM, please contact the daytime attending [email or web address]  07/14/2019, 12:18 AM

## 2019-07-14 NOTE — Progress Notes (Signed)
Pt admitted to 3W27. Alert and oriented x 4.  She c/o pain to her left arm with movement, otherwise, no complaints.  Bed alarm set, call bell within reach, and patient understands need to call before getting out of bed.  Telemetry placed and CCMD called.

## 2019-07-14 NOTE — Consult Note (Signed)
Reason for Consult:Left radial head fx Referring Physician: B Milan [name] is an 86 y.o. female.  HPI: [name] fell at the [facility]. She thinks the floor was still wet. She was brought to the ED as a trauma activation 2/2 being on Eliquis. She c/o HA and left elbow pain. CT of the elbow showed a radial head fx and orthopedic surgery was consulted. She also had a SAH. She is RHD.  Past Medical History:  Diagnosis Date  . Arthritis   . Atrial fibrillation ([place])   . Cancer ([place])    breast  . Cataract   . Diabetes mellitus without complication ([place])   . Frequent UTI   . Hyperlipidemia   . Hypertension   . Neck pain   . Scoliosis   . Stroke [facility])     Past Surgical History:  Procedure Laterality Date  . APPENDECTOMY    . BREAST LUMPECTOMY Left   . CHOLECYSTECTOMY    . JOINT REPLACEMENT     bilat knee   . REPLACEMENT TOTAL KNEE BILATERAL Bilateral   . TONSILLECTOMY      Family History  Problem Relation Age of Onset  . Cancer Mother        lung  . Arthritis Mother   . Heart disease Father        endocarditis  . Cancer Brother        lung, throat  . Alcohol abuse Brother     Social History:  reports that she has never smoked. She has never used smokeless tobacco. She reports that she does not drink alcohol or use drugs.  Allergies:  Allergies  Allergen Reactions  . Diltiazem Hcl Itching and Other (See Comments)    Red itchy rash started a few hours after taking short acting 120mg  dilt tabs  . Ace Inhibitors Cough  . Cymbalta [Duloxetine Hcl] Anxiety and Other (See Comments)    Makes patient feel "restless and scared" and unable to sleep    Medications: I have reviewed the patient's current medications.  Results for orders placed or performed during the hospital encounter of 07/13/19 (from the past 48 hour(s))  Comprehensive metabolic panel     Status: Abnormal   Collection Time: 07/13/19  9:20 PM  Result Value Ref Range   Sodium 137 135 -  145 mmol/L   Potassium 4.2 3.5 - 5.1 mmol/L   Chloride 100 98 - 111 mmol/L   CO2 26 22 - 32 mmol/L   Glucose, Bld 157 (H) 70 - 99 mg/dL    Comment: Glucose reference range applies only to samples taken after fasting for at least 8 hours.   BUN 19 8 - 23 mg/dL   Creatinine, Ser 0.95 0.44 - 1.00 mg/dL   Calcium 9.2 8.9 - 10.3 mg/dL   Total Protein 6.6 6.5 - 8.1 g/dL   Albumin 3.5 3.5 - 5.0 g/dL   AST 26 15 - 41 U/L   ALT 24 0 - 44 U/L   Alkaline Phosphatase 48 38 - 126 U/L   Total Bilirubin 0.7 0.3 - 1.2 mg/dL   GFR calc non Af Amer 52 (L) >60 mL/min   GFR calc Af Amer >60 >60 mL/min   Anion gap 11 5 - 15    Comment: Performed at [facility], [place] [address], [place],  [address]  CBC with Differential     Status: Abnormal   Collection Time: 07/13/19  9:20 PM  Result Value Ref  Range   WBC 10.4 4.0 - 10.5 K/uL   RBC 5.55 (H) 3.87 - 5.11 MIL/uL   Hemoglobin 14.5 12.0 - 15.0 g/dL   HCT 46.8 (H) 36.0 - 46.0 %   MCV 84.3 80.0 - 100.0 fL   MCH 26.1 26.0 - 34.0 pg   MCHC 31.0 30.0 - 36.0 g/dL   RDW 17.6 (H) 11.5 - 15.5 %   Platelets 264 150 - 400 K/uL   nRBC 0.0 0.0 - 0.2 %   Neutrophils Relative % 74 %   Neutro Abs 7.8 (H) 1.7 - 7.7 K/uL   Lymphocytes Relative 12 %   Lymphs Abs 1.2 0.7 - 4.0 K/uL   Monocytes Relative 10 %   Monocytes Absolute 1.0 0.1 - 1.0 K/uL   Eosinophils Relative 3 %   Eosinophils Absolute 0.3 0.0 - 0.5 K/uL   Basophils Relative 1 %   Basophils Absolute 0.1 0.0 - 0.1 K/uL   Immature Granulocytes 0 %   Abs Immature Granulocytes 0.03 0.00 - 0.07 K/uL    Comment: Performed at [facility], [address], [place], [place] [address]  Protime-INR     Status: Abnormal   Collection Time: 07/13/19  9:56 PM  Result Value Ref Range   Prothrombin Time 15.4 (H) 11.4 - 15.2 seconds   INR 1.2 0.8 - 1.2    Comment: (NOTE) INR goal varies based on device and disease states. Performed at [facility], [place] [address], [place],  [place] [address]   Type and screen [place]     Status: None   Collection Time: 07/13/19 10:10 PM  Result Value Ref Range   ABO/RH(D) A POS    Antibody Screen NEG    Sample Expiration      07/16/2019,2359 Performed at [facility], [place] [address], [place], [place] [address]   CBG monitoring, ED     Status: Abnormal   Collection Time: 07/14/19  3:20 AM  Result Value Ref Range   Glucose-Capillary 162 (H) 70 - 99 mg/dL    Comment: Glucose reference range applies only to samples taken after fasting for at least 8 hours.  Basic metabolic panel     Status: Abnormal   Collection Time: 07/14/19  5:00 AM  Result Value Ref Range   Sodium 141 135 - 145 mmol/L   Potassium 3.8 3.5 - 5.1 mmol/L   Chloride 103 98 - 111 mmol/L   CO2 28 22 - 32 mmol/L   Glucose, Bld 124 (H) 70 - 99 mg/dL    Comment: Glucose reference range applies only to samples taken after fasting for at least 8 hours.   BUN 18 8 - 23 mg/dL   Creatinine, Ser 0.74 0.44 - 1.00 mg/dL   Calcium 8.8 (L) 8.9 - 10.3 mg/dL   GFR calc non Af Amer >60 >60 mL/min   GFR calc Af Amer >60 >60 mL/min   Anion gap 10 5 - 15    Comment: Performed at [facility] [address] [place], [place] [address]  CBC     Status: Abnormal   Collection Time: 07/14/19  5:00 AM  Result Value Ref Range   WBC 10.2 4.0 - 10.5 K/uL   RBC 4.81 3.87 - 5.11 MIL/uL   Hemoglobin 12.7 12.0 - 15.0 g/dL   HCT 41.1 36.0 - 46.0 %   MCV 85.4 80.0 - 100.0 fL   MCH 26.4 26.0 - 34.0 pg   MCHC  30.9 30.0 - 36.0 g/dL   RDW 17.3 (H) 11.5 - 15.5 %   Platelets 230 150 - 400 K/uL   nRBC 0.0 0.0 - 0.2 %    Comment: Performed at [facility], [place] [address] [place], [place] [address]  CBG monitoring, ED     Status: None   Collection Time: 07/14/19  7:22 AM  Result Value Ref Range   Glucose-Capillary 90 70 - 99 mg/dL    Comment: Glucose reference range applies only to samples taken after fasting for at least 8 hours.    DG Elbow  Complete Left  Result Date: 07/13/2019 CLINICAL DATA:  Recent fall with left elbow pain, initial encounter EXAM: LEFT ELBOW - COMPLETE 3+ VIEW COMPARISON:  None. FINDINGS: There is no evidence of fracture, dislocation, or joint effusion. There is no evidence of arthropathy or other focal bone abnormality. Soft tissues are unremarkable. IMPRESSION: No acute abnormality noted. Electronically Signed   By: Inez Catalina M.D.   On: 07/13/2019 22:10   DG Forearm Left  Result Date: 07/13/2019 CLINICAL DATA:  Recent fall with left forearm pain, initial encounter EXAM: LEFT FOREARM - 2 VIEW COMPARISON:  None. FINDINGS: No acute fracture or dislocation is noted. No soft tissue abnormality is noted. Degenerative changes of the radiocarpal joint are seen. Prior posttraumatic changes of the distal radius and ulna are seen. IMPRESSION: No acute abnormality noted. Electronically Signed   By: Inez Catalina M.D.   On: 07/13/2019 22:12   CT Head Wo Contrast  Result Date: 07/13/2019 CLINICAL DATA:  Unwitnessed mechanical fall EXAM: CT HEAD WITHOUT CONTRAST CT CERVICAL SPINE WITHOUT CONTRAST TECHNIQUE: Multidetector CT imaging of the head and cervical spine was performed following the standard protocol without intravenous contrast. Multiplanar CT image reconstructions of the cervical spine were also generated. COMPARISON:  06/26/2019 FINDINGS: CT HEAD FINDINGS Brain: There is a trace volume of subarachnoid hemorrhage along the para falcine left frontal lobe (axial series 1, image 15). There is no midline shift. Advanced atrophy and chronic microvascular ischemic changes are noted. There is a small subdural hematoma along the right frontoparietal convexity measuring up to approximately 4 mm in width (coronal series 3, image 39). Vascular: No hyperdense vessel or unexpected calcification. Skull: Normal. Negative for fracture or focal lesion. Sinuses/Orbits: No acute finding. Other: None. CT CERVICAL SPINE FINDINGS Alignment:  Normal. Skull base and vertebrae: No acute fracture. No primary bone lesion or focal pathologic process. Soft tissues and spinal canal: No prevertebral fluid or swelling. No visible canal hematoma. Disc levels: Mild multilevel disc degenerative changes are noted, greatest at the C5-C6 and C6-C7 levels. Upper chest: Negative. Other: None IMPRESSION: 1. There is a small volume of acute mixed subdural and subarachnoid hemorrhage as detailed above. There is no evidence for midline shift or mass effect. 2. Advanced atrophy and chronic microvascular ischemic changes are noted. 3. No acute cervical spine fracture. These results were called by telephone at the time of interpretation on 07/13/2019 at 9:50 pm to provider [facility] , who verbally acknowledged these results. Electronically Signed   By: Constance Holster M.D.   On: 07/13/2019 21:53   CT Cervical Spine Wo Contrast  Result Date: 07/13/2019 CLINICAL DATA:  Unwitnessed mechanical fall EXAM: CT HEAD WITHOUT CONTRAST CT CERVICAL SPINE WITHOUT CONTRAST TECHNIQUE: Multidetector CT imaging of the head and cervical spine was performed following the standard protocol without intravenous contrast. Multiplanar CT image reconstructions of the cervical spine were also generated. COMPARISON:  06/26/2019 FINDINGS:  CT HEAD FINDINGS Brain: There is a trace volume of subarachnoid hemorrhage along the para falcine left frontal lobe (axial series 1, image 15). There is no midline shift. Advanced atrophy and chronic microvascular ischemic changes are noted. There is a small subdural hematoma along the right frontoparietal convexity measuring up to approximately 4 mm in width (coronal series 3, image 39). Vascular: No hyperdense vessel or unexpected calcification. Skull: Normal. Negative for fracture or focal lesion. Sinuses/Orbits: No acute finding. Other: None. CT CERVICAL SPINE FINDINGS Alignment: Normal. Skull base and vertebrae: No acute fracture. No primary bone lesion  or focal pathologic process. Soft tissues and spinal canal: No prevertebral fluid or swelling. No visible canal hematoma. Disc levels: Mild multilevel disc degenerative changes are noted, greatest at the C5-C6 and C6-C7 levels. Upper chest: Negative. Other: None IMPRESSION: 1. There is a small volume of acute mixed subdural and subarachnoid hemorrhage as detailed above. There is no evidence for midline shift or mass effect. 2. Advanced atrophy and chronic microvascular ischemic changes are noted. 3. No acute cervical spine fracture. These results were called by telephone at the time of interpretation on 07/13/2019 at 9:50 pm to provider [facility] , who verbally acknowledged these results. Electronically Signed   By: Constance Holster M.D.   On: 07/13/2019 21:53   CT Elbow Left Wo Contrast  Result Date: 07/14/2019 CLINICAL DATA:  Elbow trauma sustained during an unwitnessed fall with suspected neurovascular ligamentous and tendinous injury. EXAM: CT OF THE UPPER LEFT EXTREMITY WITHOUT CONTRAST TECHNIQUE: Multidetector CT imaging of the upper left extremity was performed according to the standard protocol. Automatic exposure control utilized. COMPARISON:  None. FINDINGS: Bones/Joint/Cartilage A 4 mm calcific density in the anterior ulnohumeral joint space, likely a loose intra-articular body. CT artifact from patient arm positioning including in the region of the radial head, with possible subtle radial head nondisplaced fracture, sagittal image 47 and coronal image 31. Nondisplaced cortical avulsion from the olecranon process. No dislocation of the left elbow. Ligaments Suboptimally assessed by CT. Muscles and Tendons Probable thickening and partial-thickness injury of the triceps tendon insertion. Soft tissues Thickening and increased density in the left posterior elbow subcutaneous soft tissues, including in the region of the olecranon bursa. No radiopaque foreign body. No apparent vascular disruption.  Elbow joint effusion with elevation of the anterior and posterior fat pads. IMPRESSION: Left elbow joint effusion and possible subtle radial head nondisplaced fracture. More sensitive evaluation with MR left elbow without contrast recommended. Probable partial-thickness injury of the left triceps tendon insertion with nondisplaced small cortical avulsion from the olecranon process. Contusion or edema in the left posterior elbow subcutaneous soft tissues, including in the region of the olecranon bursa. A 4 mm loose intra-articular body in the anterior left ulnohumeral joint. No apparent vascular injury of the left elbow major vasculature. Electronically Signed   By: Revonda Humphrey   On: 07/14/2019 00:10   DG Chest Port 1 View  Result Date: 07/13/2019 CLINICAL DATA:  Recent fall with chest pain, initial encounter EXAM: PORTABLE CHEST 1 VIEW COMPARISON:  06/26/2019 FINDINGS: Cardiac shadow is stable. Aortic calcifications are noted. Lungs are hypoinflated but clear. Postsurgical changes in the left breast are noted. No bony abnormality is seen. IMPRESSION: Poor inspiratory effort without acute abnormality. Electronically Signed   By: Inez Catalina M.D.   On: 07/13/2019 22:24   DG Humerus Left  Result Date: 07/13/2019 CLINICAL DATA:  Recent fall with left arm pain, initial encounter EXAM: LEFT HUMERUS - 2+ VIEW  COMPARISON:  None. FINDINGS: Degenerative changes of the glenohumeral articulation are noted. Bony densities are noted adjacent to the superior aspect of the glenoid likely representing loose bodies. No acute fracture or dislocation is noted. The underlying bony thorax is within normal limits. IMPRESSION: Degenerative changes without acute abnormality. Electronically Signed   By: Inez Catalina M.D.   On: 07/13/2019 22:13    Review of Systems  HENT: Negative for ear discharge, ear pain, hearing loss and tinnitus.   Eyes: Negative for photophobia and pain.  Respiratory: Negative for cough and shortness  of breath.   Cardiovascular: Negative for chest pain.  Gastrointestinal: Negative for abdominal pain, nausea and vomiting.  Genitourinary: Negative for dysuria, flank pain, frequency and urgency.  Musculoskeletal: Positive for arthralgias (Left elbow). Negative for back pain, myalgias and neck pain.  Neurological: Positive for headaches. Negative for dizziness.  Hematological: Does not bruise/bleed easily.  Psychiatric/Behavioral: The patient is not nervous/anxious.    Blood pressure 133/64, pulse 63, temperature (!) 97.3 F (36.3 C), temperature source Temporal, resp. rate 17, height 4\' 11"  (1.499 m), weight 69.4 kg, SpO2 97 %. Physical Exam  Constitutional: She appears well-developed and well-nourished. No distress.  HENT:  Head: Normocephalic and atraumatic.  Eyes: Conjunctivae are normal. Right eye exhibits no discharge. Left eye exhibits no discharge. No scleral icterus.  Cardiovascular: Normal rate and regular rhythm.  Respiratory: Effort normal. No respiratory distress.  Musculoskeletal:     Cervical back: Normal range of motion.     Comments: Left shoulder, elbow, wrist, digits- no skin wounds, splint/sling in place, diffuse elbow TTP, no instability, no blocks to motion  Sens  Ax/R/M/U intact  Mot   Ax/ R/ PIN/ M/ AIN/ U intact  Rad 2+  Neurological: She is alert.  Skin: Skin is warm and dry. She is not diaphoretic.  Psychiatric: She has a normal mood and affect. Her behavior is normal.    Assessment/Plan: Left radial head fx -- She may wear sling for comfort, NWB. Splint not necessary but ok to wear if she feels more comfortable. F/u with Dr. Doreatha Martin after discharge. TBI w/SAH -- per NS Multiple medical problems including atrial fibrillation on Eliquis, insulin-dependent diabetes mellitus, hypertension, and admission last month with acute ischemic CVA -- per primary team    Joan Abu, PA-C Orthopedic Surgery [phone] 07/14/2019, 9:57 AM

## 2019-07-14 NOTE — ED Notes (Signed)
Lunch Tray Ordered @ 1041. 

## 2019-07-14 NOTE — ED Notes (Signed)
Ortho tech placed long arm splint on left arm. Pt tolerated well. This RN assisted pt with eating breakfast.

## 2019-07-14 NOTE — Progress Notes (Addendum)
PROGRESS NOTE    [name]  [id number] DOB: [birth year] DOA: 07/13/2019 PCP: Baruch Gouty, FNP    Brief Narrative:  Patient was admitted to the hospital with a working diagnosis of traumatic left parafalcine frontal lobe subarachnoid hemorrhage and right frontal parietal subdural hematoma, complicated by left elbow nondisplaced radial head fracture.   86 year old female who presented after mechanical fall.  She does have significant past medical history for atrial fibrillation, type 2 diabetes mellitus, hypertension and ischemic CVA.  Patient fell from her own height while walking, apparently the floor was just cleaned and was slippery.  Denies any loss of consciousness, but positive neck and back pain post trauma.  On her initial physical examination blood pressure 190/93, heart rate 83, respiratory rate 23, oxygen saturation 95%, her lungs are clear to auscultation bilaterally, heart S1-S2 present, irregularly irregular, abdomen was soft, no lower extremity edema.  Patient is fully vaccinated for COVID-19. Sodium 137, potassium 4.2, chloride 100, bicarb 26, glucose 157, BUN 19, creatinine 0.95, white count 10.4, hemoglobin 14.5, hematocrit 46.8, platelets 264.  Chest radiograph with right rotation, increased lung markings bilaterally.  EKG 73 bpm, normal axis, normal QRS, atrial fibrillation rhythm, no ST segment or T wave changes.  X-rays from left upper extremity negative for fractures.  Head CT with trace volume of subarachnoid hemorrhage along the parafalcine left frontal lobe.  Small subdural hematoma along the right frontoparietal convexity up to 4 mm.  No midline shift.  Cervical spine negative for acute changes.  Left elbow CT with joint effusion and possible subtle radial head nondisplaced fracture.   Patient evaluated by neurosurgery, trauma service and orthopedics. Recommended continue conservative supportive care, admitted to progressive care unit for close neurologic  monitoring.   Assessment & Plan:   Principal Problem:   Subarachnoid hemorrhage following injury ([place]) Active Problems:   Hypertension associated with diabetes ([place])   CVA (cerebral vascular accident) ([place])   Permanent atrial fibrillation ([place])   Type 2 diabetes mellitus with diabetic neuropathy, unspecified ([facility])   Depression, recurrent ([place])   CHI (closed head injury), initial encounter   Left elbow pain   1. Traumatic left parafalcine frontal lobe subarachnoid hemorrhage and right frontal parietal subdural hematoma. Patient is awake and alert, non focal, no significant headache. Neurosurgery recommendations for supportive medical therapy.  Will continue pain control with IV morphine for severe pain and will add oral oxycodone for moderate pain. Will add scheduled acetaminophen. Continue neuro checks q 2 H and follow with neurosurgery recommendations. Follow with PT and OT evaluations.   Patient continue to be at risk for worsening bleeding.   2. Chronic atrial fibrillation. Patient is rate controlled, continue telemetry monitoring, hold anticoagulation due to acute intracranial bleed. At home patient not on av blockade.  3. HTN emergency. Blood pressure 128/63 this am, will continue pain control and hydralazine 50 mg po bid.  4. T2DM (Hgb A 1c 6.3)with dyslipidemia. Will continue glucose control and monitoring with insulin sliding scale. Continue with statin therapy. Continue to hold on oral antihyperglycemic agents.   Fasting glucose is 124, will decrease basal dose to 12 units from 25 units, poor oral intake and risk of hypoglycemia.   5. Hx of CVA. Hold on antiplatelet therapy due to acute intracranial bleed.   6. Acute left radial head non displaced fracture. Continue sling for immobilization, will continue pain control.   DVT prophylaxis: scd   Code Status:  dnr  Family Communication: I spoke over the phone with  the patient's son about patient's  condition, plan of care,  prognosis and all questions were addressed. Disposition Plan/ discharge barriers: patient from SNF, barrier for dc is acute intracranial bleed, pending further observation and inpatient follow up.     Consultants:   Neurosurgery   Trauma  Orthopedics   Procedures:     Antimicrobials:       Subjective: Patient with no significant headache, positive back pain and left elbow pain, improved with analgesics, worse with movement, no nausea or vomiting, no chest pain or dyspnea.   Objective: Vitals:   07/14/19 0845 07/14/19 0930 07/14/19 1015 07/14/19 1100  BP: (!) 123/56 133/64 (!) 107/45 128/63  Pulse: 61 63 80 70  Resp: 20 17 18 17   Temp:      TempSrc:      SpO2: 97% 97% 96% 94%  Weight:      Height:       No intake or output data in the 24 hours ending 07/14/19 1142 Filed Weights   07/13/19 2130  Weight: 69.4 kg    Examination:   General: deconditioned and ill looking appearing  Neurology: Awake and alert, non focal  E ENT: mild pallor, no icterus, oral mucosa moist Cardiovascular: No JVD. S1-S2 present, rhythmic, no gallops, rubs, or murmurs. No lower extremity edema. Pulmonary: positive breath sounds bilaterally, adequate air movement, no wheezing, rhonchi or rales. Gastrointestinal. Abdomen with, no organomegaly, non tender, no rebound or guarding Skin. No rashes Musculoskeletal: left arm on sling. Tender to passive motion left upper extremity.      Data Reviewed: I have personally reviewed following labs and imaging studies  CBC: Recent Labs  Lab 07/13/19 2120 07/14/19 0500  WBC 10.4 10.2  NEUTROABS 7.8*  --   HGB 14.5 12.7  HCT 46.8* 41.1  MCV 84.3 85.4  PLT 264 [id number]   Basic Metabolic Panel: Recent Labs  Lab 07/13/19 2120 07/14/19 0500  NA 137 141  K 4.2 3.8  CL 100 103  CO2 26 28  GLUCOSE 157* 124*  BUN 19 18  CREATININE 0.95 0.74  CALCIUM 9.2 8.8*   GFR: Estimated Creatinine Clearance: 38.8 mL/min (by C-G formula based on SCr of  0.74 mg/dL). Liver Function Tests: Recent Labs  Lab 07/13/19 2120  AST 26  ALT 24  ALKPHOS 48  BILITOT 0.7  PROT 6.6  ALBUMIN 3.5   No results for input(s): LIPASE, AMYLASE in the last 168 hours. No results for input(s): AMMONIA in the last 168 hours. Coagulation Profile: Recent Labs  Lab 07/13/19 2156  INR 1.2   Cardiac Enzymes: No results for input(s): CKTOTAL, CKMB, CKMBINDEX, TROPONINI in the last 168 hours. BNP (last 3 results) No results for input(s): PROBNP in the last 8760 hours. HbA1C: No results for input(s): HGBA1C in the last 72 hours. CBG: Recent Labs  Lab 07/14/19 0320 07/14/19 0722 07/14/19 1128  GLUCAP 162* 90 183*   Lipid Profile: No results for input(s): CHOL, HDL, LDLCALC, TRIG, CHOLHDL, LDLDIRECT in the last 72 hours. Thyroid Function Tests: No results for input(s): TSH, T4TOTAL, FREET4, T3FREE, THYROIDAB in the last 72 hours. Anemia Panel: No results for input(s): VITAMINB12, FOLATE, FERRITIN, TIBC, IRON, RETICCTPCT in the last 72 hours.    Radiology Studies: I have reviewed all of the imaging during this hospital visit personally     Scheduled Meds: . atorvastatin  80 mg Oral q1800  . hydrALAZINE  50 mg Oral BID  . insulin aspart  0-9 Units Subcutaneous Q4H  .  insulin glargine  25 Units Subcutaneous QHS  . pantoprazole  40 mg Oral BID  . rOPINIRole  0.25 mg Oral BID WC   And  . rOPINIRole  0.5 mg Oral QHS  . senna-docusate  1 tablet Oral BID  . sodium chloride flush  3 mL Intravenous Q12H   Continuous Infusions: . sodium chloride Stopped (07/14/19 1132)     LOS: 1 day        Lorrie Gargan Gerome Apley, MD

## 2019-07-14 NOTE — Evaluation (Signed)
Physical Therapy Evaluation Patient Details Name: [name] MRN: [id number] DOB: 08-20-1925 Today's Date: 07/14/2019   History of Present Illness  Pt is a 86 y/o female admitted secondary to fall at SNF. Found to have subarachnoid hemorrhage, L subtle radial head fracture, and L partial thickness triceps tendon injury. PMH includes HTN, DM and a fib.   Clinical Impression  Pt admitted secondary to problem above with deficits below. Pt requiring max A for bed mobility. Attempted to stand, however, only able to get minimal clearance from EOB with max A. Feel pt will required continued therapies at SNF prior to return home. Will continue to follow acutely to maximize functional mobility independence and safety.     Follow Up Recommendations SNF;Supervision/Assistance - 24 hour    Equipment Recommendations  None recommended by PT    Recommendations for Other Services       Precautions / Restrictions Precautions Precautions: Fall Precaution Comments: Had fall at SNF  Required Braces or Orthoses: Sling Restrictions Weight Bearing Restrictions: Yes LUE Weight Bearing: Non weight bearing      Mobility  Bed Mobility Overal bed mobility: Needs Assistance Bed Mobility: Supine to Sit;Sit to Supine     Supine to sit: Max assist Sit to supine: Max assist   General bed mobility comments: Max A for trunk elevation and LE assist.   Transfers Overall transfer level: Needs assistance Equipment used: 1 person hand held assist             General transfer comment: Attempted to stand with max A, however, was only able to clear hips partially from surface with max A. Will require +2 assist for further mobility.   Ambulation/Gait                Stairs            Wheelchair Mobility    Modified [name] (Stroke Patients Only)       Balance Overall balance assessment: Needs assistance Sitting-balance support: Single extremity supported;Feet supported Sitting  balance-Leahy Scale: Poor Sitting balance - Comments: Reliant on at least min A to maintain balance and RUE support                                      Pertinent Vitals/Pain Pain Assessment: Faces Faces Pain Scale: Hurts whole lot Pain Location: LUE  Pain Descriptors / Indicators: Grimacing;Guarding;Moaning Pain Intervention(s): Limited activity within patient's tolerance;Monitored during session    Home Living Family/patient expects to be discharged to:: Skilled nursing facility                      Prior Function Level of Independence: Needs assistance   Gait / Transfers Assistance Needed: Was working with therapy on ambulation.   ADL's / Homemaking Assistance Needed: Required assist from staff         Hand Dominance        Extremity/Trunk Assessment   Upper Extremity Assessment Upper Extremity Assessment: Defer to OT evaluation;LUE deficits/detail LUE Deficits / Details: LUE in sling    Lower Extremity Assessment Lower Extremity Assessment: Generalized weakness    Cervical / Trunk Assessment Cervical / Trunk Assessment: Kyphotic  Communication   Communication: No difficulties  Cognition Arousal/Alertness: Awake/alert Behavior During Therapy: WFL for tasks assessed/performed Overall Cognitive Status: No family/caregiver present to determine baseline cognitive functioning Area of Impairment: Memory;Problem solving;Safety/judgement;Following commands  Memory: Decreased short-term memory Following Commands: Follows one step commands with increased time;Follows one step commands inconsistently Safety/Judgement: Decreased awareness of deficits   Problem Solving: Slow processing;Difficulty sequencing;Requires verbal cues General Comments: Pt attempting to reach out for bedside table for support, and told PT to step out of the way. Educated that PT was there to assist and was unsafe to attempt to stand with beside  table as it was on wheels.       General Comments      Exercises     Assessment/Plan    PT Assessment Patient needs continued PT services  PT Problem List Decreased strength;Decreased range of motion;Decreased activity tolerance;Decreased balance;Decreased mobility;Decreased coordination;Decreased cognition;Decreased knowledge of use of DME;Decreased safety awareness;Decreased knowledge of precautions       PT Treatment Interventions DME instruction;Gait training;Functional mobility training;Therapeutic activities;Therapeutic exercise;Balance training;Cognitive remediation;Patient/family education    PT Goals (Current goals can be found in the Care Plan section)  Acute Rehab PT Goals Patient Stated Goal: to decrease pain  PT Goal Formulation: With patient Time For Goal Achievement: 07/28/19 Potential to Achieve Goals: Fair    Frequency Min 2X/week   Barriers to discharge        Co-evaluation               AM-PAC PT "6 Clicks" Mobility  Outcome Measure Help needed turning from your back to your side while in a flat bed without using bedrails?: Total Help needed moving from lying on your back to sitting on the side of a flat bed without using bedrails?: Total Help needed moving to and from a bed to a chair (including a wheelchair)?: Total Help needed standing up from a chair using your arms (e.g., wheelchair or bedside chair)?: Total Help needed to walk in hospital room?: Total Help needed climbing 3-5 steps with a railing? : Total 6 Click Score: 6    End of Session   Activity Tolerance: Patient limited by pain Patient left: in bed;with call bell/phone within reach;with bed alarm set Nurse Communication: Mobility status PT Visit Diagnosis: Unsteadiness on feet (R26.81);Other abnormalities of gait and mobility (R26.89);History of falling (Z91.81)    Time: 1700-1717 PT Time Calculation (min) (ACUTE ONLY): 17 min   Charges:   PT Evaluation $PT Eval Moderate  Complexity: 1 Mod          [name], PT, DPT  Acute Rehabilitation Services  Pager: [phone] Office: [phone]   Rudean Hitt 07/14/2019, 6:17 PM

## 2019-07-14 NOTE — Consult Note (Signed)
Neurosurgery Consultation  Reason for Consult: Subdural hematoma Referring Physician: Ralene Bathe  CC: LUE pain  HPI: This is a 86 y.o. woman that presents after a fall at SNF. She takes eliquis for PAF. Currently denies headache, only complaining of LUE pain, otherwise she states she feels like she is at her baseline. She denies any new weakness, numbness, or parasthesias. No nausea or vomiting. LUE pain is localized to the left wrist, worse with any passive / active movement, severe in intensity, sharp in quality.    ROS: A 14 point ROS was performed and is negative except as noted in the HPI.   PMHx:  Past Medical History:  Diagnosis Date  . Arthritis   . Atrial fibrillation ([place])   . Cancer ([place])    breast  . Cataract   . Diabetes mellitus without complication ([place])   . Frequent UTI   . Hyperlipidemia   . Hypertension   . Neck pain   . Scoliosis   . Stroke [facility])    FamHx:  Family History  Problem Relation Age of Onset  . Cancer Mother        lung  . Arthritis Mother   . Heart disease Father        endocarditis  . Cancer Brother        lung, throat  . Alcohol abuse Brother    SocHx:  reports that she has never smoked. She has never used smokeless tobacco. She reports that she does not drink alcohol or use drugs.  Exam: Vital signs in last 24 hours: Temp:  [97.3 F (36.3 C)] 97.3 F (36.3 C) (03/10 2117) Pulse Rate:  [50-96] 50 (03/11 0715) Resp:  [16-23] 18 (03/11 0715) BP: (126-204)/(56-124) 153/91 (03/11 0715) SpO2:  [90 %-100 %] 97 % (03/11 0715) Weight:  [69.4 kg] 69.4 kg (03/10 2130) General: Awake, alert, cooperative, lying in bed, favoring LUE Head: normocephalic and atruamatic HEENT: neck supple Pulmonary: breathing room air comfortably, no evidence of increased work of breathing Cardiac: irregularly irregular Abdomen: S NT ND Extremities: warm and well perfused, unable to examine LUE due to severe pain w/ any movement Neuro: AOx3, PERRL w/ likely old  phaco scars OU, EOMI, FS Strength 5/5 x4, SILTx4, no drift on R, unable to test on L due to pain  Assessment and Plan: 86 y.o. woman s/p fall while on eliquis for PAF. [place] personally reviewed, which shows L frontal and R parietal small parafalcine subdural hematomas, possibly some scattered traumatic subarachnoid hemorrhage in the left convexity.   -no acute neurosurgical intervention indicated at this time -no need to reverse anticoagulants, last dose of eliquis almost 24h ago when patient was seen and neurologically stable. Hold eliquis for 7d, no need for repeat CTH, will follow clinically.  Judith Part, MD 07/14/19 7:21 AM [facility]

## 2019-07-15 DIAGNOSIS — I10 Essential (primary) hypertension: Secondary | ICD-10-CM

## 2019-07-15 DIAGNOSIS — S52125A Nondisplaced fracture of head of left radius, initial encounter for closed fracture: Secondary | ICD-10-CM

## 2019-07-15 DIAGNOSIS — E1159 Type 2 diabetes mellitus with other circulatory complications: Secondary | ICD-10-CM

## 2019-07-15 LAB — BASIC METABOLIC PANEL
Anion gap: 13 (ref 5–15)
BUN: 10 mg/dL (ref 8–23)
CO2: 29 mmol/L (ref 22–32)
Calcium: 8.8 mg/dL — ABNORMAL LOW (ref 8.9–10.3)
Chloride: 99 mmol/L (ref 98–111)
Creatinine, Ser: 0.53 mg/dL (ref 0.44–1.00)
GFR calc Af Amer: >60 mL/min (ref >60)
GFR calc non Af Amer: >60 mL/min (ref >60)
Glucose, Bld: 143 mg/dL — ABNORMAL HIGH (ref 70–99)
Potassium: 3.4 mmol/L — ABNORMAL LOW (ref 3.5–5.1)
Sodium: 141 mmol/L (ref 135–145)

## 2019-07-15 LAB — GLUCOSE, CAPILLARY
Glucose-Capillary: 132 mg/dL — ABNORMAL HIGH (ref 70–99)
Glucose-Capillary: 121 mg/dL — ABNORMAL HIGH (ref 70–99)
Glucose-Capillary: 198 mg/dL — ABNORMAL HIGH (ref 70–99)
Glucose-Capillary: 183 mg/dL — ABNORMAL HIGH (ref 70–99)
Glucose-Capillary: 130 mg/dL — ABNORMAL HIGH (ref 70–99)

## 2019-07-15 MED ORDER — IBUPROFEN 200 MG PO TABS
200.0000 mg | ORAL_TABLET | Freq: Three times a day (TID) | ORAL | Status: DC
  Administered 2019-07-15 – 2019-07-18 (×8): 200 mg via ORAL
  Filled 2019-07-15 (×9): qty 1

## 2019-07-15 MED ORDER — INSULIN ASPART 100 UNIT/ML ~~LOC~~ SOLN
0.0000 [IU] | Freq: Three times a day (TID) | SUBCUTANEOUS | Status: DC
  Administered 2019-07-16 – 2019-07-18 (×5): 1 [IU] via SUBCUTANEOUS

## 2019-07-15 MED ORDER — ACETAMINOPHEN 500 MG PO TABS
1000.0000 mg | ORAL_TABLET | Freq: Three times a day (TID) | ORAL | Status: DC
  Administered 2019-07-15 – 2019-07-18 (×10): 1000 mg via ORAL
  Filled 2019-07-15 (×10): qty 2

## 2019-07-15 NOTE — Progress Notes (Signed)
Neurosurgery Service Progress Note  Subjective: No acute events overnight, no new complaints, no HA, L forearm still painful   Objective: Vitals:   07/15/19 0122 07/15/19 0300 07/15/19 0324 07/15/19 0822  BP: (!) 167/86 (!) 180/71 139/70 (!) 144/60  Pulse: 72 68 (!) 56 88  Resp: 20 18 16 20   Temp: 98 F (36.7 C) 98.2 F (36.8 C)  98.4 F (36.9 C)  TempSrc: Oral Oral  Oral  SpO2: 98%   98%  Weight:      Height:       Temp (24hrs), Avg:98.2 F (36.8 C), Min:97.9 F (36.6 C), Max:98.8 F (37.1 C)  CBC Latest Ref Rng & Units 07/14/2019 07/13/2019 06/28/2019  WBC 4.0 - 10.5 K/uL 10.2 10.4 10.8(H)  Hemoglobin 12.0 - 15.0 g/dL 12.7 14.5 14.8  Hematocrit 36.0 - 46.0 % 41.1 46.8(H) 46.6(H)  Platelets 150 - 400 K/uL 230 264 208   BMP Latest Ref Rng & Units 07/15/2019 07/14/2019 07/13/2019  Glucose 70 - 99 mg/dL 143(H) 124(H) 157(H)  BUN 8 - 23 mg/dL 10 18 19   Creatinine 0.44 - 1.00 mg/dL 0.53 0.74 0.95  BUN/Creat Ratio 12 - 28 - - -  Sodium 135 - 145 mmol/L 141 141 137  Potassium 3.5 - 5.1 mmol/L 3.4(L) 3.8 4.2  Chloride 98 - 111 mmol/L 99 103 100  CO2 22 - 32 mmol/L 29 28 26   Calcium 8.9 - 10.3 mg/dL 8.8(L) 8.8(L) 9.2    Intake/Output Summary (Last 24 hours) at 07/15/2019 0841 Last data filed at 07/14/2019 2330 Gross per 24 hour  Intake 150 ml  Output --  Net 150 ml    Current Facility-Administered Medications:  .  acetaminophen (TYLENOL) tablet 650 mg, 650 mg, Oral, Q6H, Arrien, Jimmy Picket, MD, 650 mg at 07/15/19 0528 .  atorvastatin (LIPITOR) tablet 80 mg, 80 mg, Oral, q1800, Opyd, Ilene Qua, MD, 80 mg at 07/14/19 1730 .  hydrALAZINE (APRESOLINE) tablet 50 mg, 50 mg, Oral, BID, Opyd, Ilene Qua, MD, 50 mg at 07/14/19 2220 .  insulin aspart (novoLOG) injection 0-9 Units, 0-9 Units, Subcutaneous, Q4H, Opyd, Ilene Qua, MD, 2 Units at 07/15/19 0400 .  insulin glargine (LANTUS) injection 12 Units, 12 Units, Subcutaneous, QHS, Arrien, Jimmy Picket, MD, 12 Units at 07/14/19  2222 .  labetalol (NORMODYNE) injection 10 mg, 10 mg, Intravenous, Q2H PRN, Opyd, Ilene Qua, MD, 10 mg at 07/15/19 0308 .  methocarbamol (ROBAXIN) tablet 500 mg, 500 mg, Oral, Q8H PRN, Meuth, Brooke A, PA-C .  morphine 2 MG/ML injection 2 mg, 2 mg, Intravenous, Q4H PRN, Meuth, Brooke A, PA-C, 2 mg at 07/14/19 1426 .  ondansetron (ZOFRAN) injection 4 mg, 4 mg, Intravenous, Q6H PRN, Opyd, Timothy S, MD .  oxyCODONE (Oxy IR/ROXICODONE) immediate release tablet 5 mg, 5 mg, Oral, Q4H PRN, Meuth, Brooke A, PA-C .  pantoprazole (PROTONIX) EC tablet 40 mg, 40 mg, Oral, BID, Opyd, Ilene Qua, MD, 40 mg at 07/14/19 2220 .  polyethylene glycol (MIRALAX / GLYCOLAX) packet 17 g, 17 g, Oral, Daily PRN, Opyd, Ilene Qua, MD .  rOPINIRole (REQUIP) tablet 0.25 mg, 0.25 mg, Oral, BID WC, 0.25 mg at 07/14/19 1132 **AND** rOPINIRole (REQUIP) tablet 0.5 mg, 0.5 mg, Oral, QHS, Arrien, Jimmy Picket, MD, 0.5 mg at 07/14/19 2222 .  senna-docusate (Senokot-S) tablet 1 tablet, 1 tablet, Oral, BID, Opyd, Ilene Qua, MD, 1 tablet at 07/14/19 2220 .  sodium chloride flush (NS) 0.9 % injection 3 mL, 3 mL, Intravenous, Q12H, Opyd, Ilene Qua, MD, 3  mL at 07/14/19 2227   Physical Exam: Strength 5/5x4 except limited in LUE by frx, SILTx4  Assessment & Plan: 86 y.o. woman s/p fall w/ b/l small parafalcine SDH.  -okay for discharge from a neurosurgical perspective, pt can restart eliquis 3/18, see me in 2 weeks, will place instructions in Epic discharge section. Okay for prophylactic dose anticoagulation as indicated.  Judith Part  07/15/19 8:41 AM

## 2019-07-15 NOTE — Progress Notes (Addendum)
PROGRESS NOTE    [name]  [id number] DOB: [birth year] DOA: 07/13/2019 PCP: Baruch Gouty, FNP    Brief Narrative:  Patient was admitted to the hospital with a working diagnosis of traumatic left parafalcine frontal lobe subarachnoid hemorrhage and right frontal parietal subdural hematoma, complicated by left elbow nondisplaced radial head fracture.   86 year old female who presented after mechanical fall.  She does have significant past medical history for atrial fibrillation, type 2 diabetes mellitus, hypertension and ischemic CVA.  Patient fell from her own height while walking, apparently the floor was just cleaned and was slippery.  Denies any loss of consciousness, but positive neck and back pain post trauma.  On her initial physical examination blood pressure 190/93, heart rate 83, respiratory rate 23, oxygen saturation 95%, her lungs are clear to auscultation bilaterally, heart S1-S2 present, irregularly irregular, abdomen was soft, no lower extremity edema.  Patient is fully vaccinated for COVID-19. Sodium 137, potassium 4.2, chloride 100, bicarb 26, glucose 157, BUN 19, creatinine 0.95, white count 10.4, hemoglobin 14.5, hematocrit 46.8, platelets 264.  Chest radiograph with right rotation, increased lung markings bilaterally.  EKG 73 bpm, normal axis, normal QRS, atrial fibrillation rhythm, no ST segment or T wave changes.  X-rays from left upper extremity negative for fractures.  Head CT with trace volume of subarachnoid hemorrhage along the parafalcine left frontal lobe.  Small subdural hematoma along the right frontoparietal convexity up to 4 mm.  No midline shift.  Cervical spine negative for acute changes.  Left elbow CT with joint effusion and possible subtle radial head nondisplaced fracture.   Patient evaluated by neurosurgery, trauma service and orthopedics. Recommended continue conservative supportive care, admitted to progressive care unit for close neurologic  monitoring.   Patient has remained stable, no neurologic changes, continue to need pain control. Will change to medical ward, possible dc in am back to SNF.    Assessment & Plan:   Principal Problem:   Subarachnoid hemorrhage following injury ([place]) Active Problems:   Hypertension associated with diabetes ([place])   CVA (cerebral vascular accident) ([place])   Permanent atrial fibrillation ([place])   Type 2 diabetes mellitus with diabetic neuropathy, unspecified ([place])   Depression, recurrent ([place])   CHI (closed head injury), initial encounter   Left radial head fracture    1. Traumatic left parafalcine frontal lobe subarachnoid hemorrhage and right frontal parietal subdural hematoma. Continue to be awake and alert, with non focal deficits or significant headache.   No further recommendations from Neurosurgery or trauma service.  Continue pain control with scheduled acetaminophen and will add low dose ibuprofen. Neuro checks per unit protocol.  Patient continue to be very weak and deconditioned, will continue OT and PT evaluations. Plan patient to return to SNF.   2. Chronic atrial fibrillation. Continue with controlled, no anticoagulation due to intracranial bleed.  3. HTN emergency. Improved blood pressure control with hydralazine 50 mg po bid.  4. T2DM (Hgb A 1c 6.3)with dyslipidemia. Continue insulin sliding scale for glucose cover and monitoring and basal insulin therapy at a reduced dose. On statin therapy.   5. Hx of CVA. Continue to hold on antiplatelet therapy due to acute intracranial bleed.   6. Acute left radial head non displaced fracture. Pain control with oral analgesics and anti-inflammatories. Opiates for sever pain, will continue to maintain sling for immobilization.    DVT prophylaxis: scd   Code Status:  dnr  Family Communication: I spoke with patient's son at the bedside, we talked  in detail about patient's condition, plan of care and prognosis and all  questions were addressed. Disposition Plan/ discharge barriers: patient from SNF, barrier for dc is acute intracranial bleed, pending further observation and inpatient follow up.    Consultants:   Trauma   Neurosurgery   Subjective: Patient continue to have significant pain at her left upper extremity, worse with movement. Denies any nausea or vomiting, no chest pain or dyspnea, her son is at her baseline.   Objective: Vitals:   07/15/19 0300 07/15/19 0324 07/15/19 0822 07/15/19 1215  BP: (!) 180/71 139/70 (!) 144/60 (!) 136/57  Pulse: 68 (!) 56 88 62  Resp: 18 16 20 18   Temp: 98.2 F (36.8 C)  98.4 F (36.9 C) 98.3 F (36.8 C)  TempSrc: Oral  Oral Oral  SpO2:   98% 90%  Weight:      Height:        Intake/Output Summary (Last 24 hours) at 07/15/2019 1411 Last data filed at 07/14/2019 2330 Gross per 24 hour  Intake 150 ml  Output --  Net 150 ml   Filed Weights   07/13/19 2130  Weight: 69.4 kg    Examination:   General: Not in pain or dyspnea, deconditioned Neurology: Awake and alert, non focal  E ENT: no pallor, no icterus, oral mucosa moist Cardiovascular: No JVD. S1-S2 present, rhythmic, no gallops, rubs, or murmurs. No lower extremity edema. Pulmonary: positive breath sounds bilaterally, adequate air movement, no wheezing, rhonchi or rales. Gastrointestinal. Abdomen with no organomegaly, non tender, no rebound or guarding Skin. No rashes Musculoskeletal: no joint deformities/ left upper extremity in a sling. Decrease range of motion due to pain.      Data Reviewed: I have personally reviewed following labs and imaging studies  CBC: Recent Labs  Lab 07/13/19 2120 07/14/19 0500  WBC 10.4 10.2  NEUTROABS 7.8*  --   HGB 14.5 12.7  HCT 46.8* 41.1  MCV 84.3 85.4  PLT 264 [id number]   Basic Metabolic Panel: Recent Labs  Lab 07/13/19 2120 07/14/19 0500 07/15/19 0442  NA 137 141 141  K 4.2 3.8 3.4*  CL 100 103 99  CO2 26 28 29   GLUCOSE 157* 124* 143*   BUN 19 18 10   CREATININE 0.95 0.74 0.53  CALCIUM 9.2 8.8* 8.8*   GFR: Estimated Creatinine Clearance: 38.8 mL/min (by C-G formula based on SCr of 0.53 mg/dL). Liver Function Tests: Recent Labs  Lab 07/13/19 2120  AST 26  ALT 24  ALKPHOS 48  BILITOT 0.7  PROT 6.6  ALBUMIN 3.5   No results for input(s): LIPASE, AMYLASE in the last 168 hours. No results for input(s): AMMONIA in the last 168 hours. Coagulation Profile: Recent Labs  Lab 07/13/19 2156  INR 1.2   Cardiac Enzymes: No results for input(s): CKTOTAL, CKMB, CKMBINDEX, TROPONINI in the last 168 hours. BNP (last 3 results) No results for input(s): PROBNP in the last 8760 hours. HbA1C: No results for input(s): HGBA1C in the last 72 hours. CBG: Recent Labs  Lab 07/14/19 2025 07/14/19 2336 07/15/19 0316 07/15/19 0829 07/15/19 1208  GLUCAP 113* 106* 132* 121* 198*   Lipid Profile: No results for input(s): CHOL, HDL, LDLCALC, TRIG, CHOLHDL, LDLDIRECT in the last 72 hours. Thyroid Function Tests: No results for input(s): TSH, T4TOTAL, FREET4, T3FREE, THYROIDAB in the last 72 hours. Anemia Panel: No results for input(s): VITAMINB12, FOLATE, FERRITIN, TIBC, IRON, RETICCTPCT in the last 72 hours.    Radiology Studies: I have reviewed all of  the imaging during this hospital visit personally     Scheduled Meds: . acetaminophen  1,000 mg Oral Q8H  . atorvastatin  80 mg Oral q1800  . hydrALAZINE  50 mg Oral BID  . ibuprofen  200 mg Oral TID  . insulin aspart  0-9 Units Subcutaneous Q4H  . insulin glargine  12 Units Subcutaneous QHS  . pantoprazole  40 mg Oral BID  . rOPINIRole  0.25 mg Oral BID WC   And  . rOPINIRole  0.5 mg Oral QHS  . senna-docusate  1 tablet Oral BID  . sodium chloride flush  3 mL Intravenous Q12H   Continuous Infusions:   LOS: 2 days        Annelyse Rey Gerome Apley, MD

## 2019-07-15 NOTE — Progress Notes (Signed)
OT NOTE   07/15/19 1200  OT Visit Information  Last OT Received On 07/15/19  Assistance Needed +2  History of Present Illness Pt is a 86 y/o female admitted secondary to fall at SNF. Found to have subarachnoid hemorrhage, L subtle radial head fracture, and L partial thickness triceps tendon injury. PMH includes HTN, DM and a fib.   Precautions  Precautions Fall  Precaution Comments Had fall at SNF   Required Braces or Orthoses Sling  Restrictions  Weight Bearing Restrictions Yes  LUE Weight Bearing NWB  Home Living  Family/patient expects to be discharged to: [place] Alone  Available Help at Discharge Family;Available PRN/intermittently  Type of Home House  Home Access Stairs to enter  Entrance Stairs-Number of Steps 4  Entrance Stairs-Rails Right  Home Layout One level  Bathroom Shower/Tub Walk-in shower  Whiteside seat - built in;Walker - 4 wheels;Cane - single point;BSC;Grab bars - tub/shower  Additional Comments Son lives in house next door.    Lives With Alone  Prior Function  Level of Independence Needs assistance  Gait / Transfers Assistance Needed therapy came to house after a fall and terminated therapy in March 2020.   ADL's / Homemaking Assistance Needed no assistance and driving .  Communication  Communication No difficulties  Pain Assessment  Pain Assessment No/denies pain  Cognition  Arousal/Alertness Awake/alert  Behavior During Therapy WFL for tasks assessed/performed  Overall Cognitive Status No family/caregiver present to determine baseline cognitive functioning  General Comments pt reports living at home alone and driving prior to admission. pt reports alwaya walking with 4WW  Upper Extremity Assessment  Upper Extremity Assessment LUE deficits/detail  LUE Deficits / Details lue IN SLING able to complete AROM of all digits. pt very painful with any attempts to adjust  sling  Lower Extremity Assessment  Lower Extremity Assessment Defer to PT evaluation  Cervical / Trunk Assessment  Cervical / Trunk Assessment Kyphotic  ADL  Overall ADL's  Needs assistance/impaired  Eating/Feeding Set up;Sitting  Grooming Set up;Sitting  Upper Body Bathing Moderate assistance  Lower Body Bathing Total assistance  Upper Body Dressing  Moderate assistance  Lower Body Dressing Total assistance  Bed Mobility  Overal bed mobility Needs Assistance  Bed Mobility Rolling  Rolling Mod assist  General bed mobility comments pt reports being fatigues and declines oob. patient educated on sling placement, elevation and ice placement. pt reports increased comfort with bed repositioning  General Comments  General comments (skin integrity, edema, etc.) Belt out on arrival and pt with saturations 97% RA.   OT - End of Session  Activity Tolerance Patient tolerated treatment well  Patient left in bed;with call bell/phone within reach;with bed alarm set  Nurse Communication Mobility status;Precautions;Weight bearing status  OT Assessment  OT Recommendation/Assessment Patient needs continued OT Services  OT Visit Diagnosis Unsteadiness on feet (R26.81);Other abnormalities of gait and mobility (R26.89);Muscle weakness (generalized) (M62.81)  OT Problem List Decreased strength;Decreased range of motion;Decreased activity tolerance;Impaired balance (sitting and/or standing);Decreased knowledge of use of DME or AE;Decreased knowledge of precautions;Decreased cognition;Decreased safety awareness  OT Plan  OT Frequency (ACUTE ONLY) Min 2X/week  OT Treatment/Interventions (ACUTE ONLY) Self-care/ADL training;Therapeutic exercise;DME and/or AE instruction;Energy conservation;Therapeutic activities;Patient/family education;Manual therapy;Modalities;Cognitive remediation/compensation  AM-PAC OT "6 Clicks" Daily Activity Outcome Measure (Version 2)  Help from another person eating meals? 3  Help from  another person taking care of personal grooming? 3  Help from another person toileting,  which includes using toliet, bedpan, or urinal? 2  Help from another person bathing (including washing, rinsing, drying)? 2  Help from another person to put on and taking off regular upper body clothing? 2  Help from another person to put on and taking off regular lower body clothing? 1  6 Click Score 13  OT Recommendation  Follow Up Recommendations SNF  OT Equipment Wheelchair (measurements OT);Wheelchair cushion (measurements OT);Hospital bed  Individuals Consulted  Consulted and Agree with Results and Recommendations Patient unable/family or caregiver not available  Acute Rehab OT Goals  Patient Stated Goal to take a nap  OT Goal Formulation Patient unable to participate in goal setting  Time For Goal Achievement 07/29/19  Potential to Achieve Goals Good  OT Time Calculation  OT Start Time (ACUTE ONLY) 1229  OT Stop Time (ACUTE ONLY) 1252  OT Time Calculation (min) 23 min  OT General Charges  $OT Visit 1 Visit  OT Evaluation  $OT Eval Moderate Complexity 1 Mod  Written Expression  Dominant Hand Right     Brynn, OTR/L  Acute Rehabilitation Services Pager: [phone] Office: [phone] .

## 2019-07-15 NOTE — Final Consult Note (Addendum)
[facility] Progress Note     Subjective: Patient reports some mild intermittent headache. Denies blurred vision or nausea. Alert and oriented x4. Reports pain in LUE and that getting up to the bathroom was "chaotic". Sling to LUE does help with stability. Patient reports she had been at a SNF in [place].   Review of Systems  Constitutional: Negative for chills and fever.  Eyes: Negative for blurred vision and double vision.  Respiratory: Negative for shortness of breath.   Cardiovascular: Negative for chest pain.  Gastrointestinal: Negative for abdominal pain, nausea and vomiting.  Genitourinary: Negative for dysuria, frequency and urgency.  Musculoskeletal: Positive for joint pain (LUE). Negative for back pain and neck pain.  Skin: Negative for rash.  Neurological: Positive for headaches. Negative for dizziness and sensory change.    Objective: Vital signs in last 24 hours: Temp:  [97.9 F (36.6 C)-98.8 F (37.1 C)] 98.4 F (36.9 C) (03/12 0822) Pulse Rate:  [56-113] 88 (03/12 0822) Resp:  [8-20] 20 (03/12 0822) BP: (107-180)/(45-151) 144/60 (03/12 0822) SpO2:  [92 %-100 %] 98 % (03/12 0822) Last BM Date: 07/13/19  Intake/Output from previous day: 03/11 0701 - 03/12 0700 In: 150 [P.O.:150] Out: -  Intake/Output this shift: No intake/output data recorded.  PE: General: pleasant, WD, WN white female who is laying in bed in NAD HEENT: No lacerations or ecchymosis.  Sclera are noninjected.  PERRL.  Ears and nose without any masses or lesions.  Mouth is pink and moist Heart: regular, rate, and rhythm.  Normal s1,s2. No obvious murmurs, gallops, or rubs noted.  Palpable radial and pedal pulses bilaterally Lungs: CTAB, no wh eezes, rhonchi, or rales noted.  Respiratory effort nonlabored Abd: soft, NT, ND, +BS, no masses, hernias, or organomegaly MS: LUE with sling and splint present, L fingers WWP with 5/5 grip; remaining extremities are symmetrical with no  cyanosis, clubbing, or edema. Skin: warm and dry with no masses, lesions, or rashes Neuro: Cranial nerves 2-12 grossly intact, sensation is normal throughout Psych: A&Ox4 with an appropriate affect.   Lab Results:  Recent Labs    07/13/19 2120 07/14/19 0500  WBC 10.4 10.2  HGB 14.5 12.7  HCT 46.8* 41.1  PLT 264 230   BMET Recent Labs    07/14/19 0500 07/15/19 0442  NA 141 141  K 3.8 3.4*  CL 103 99  CO2 28 29  GLUCOSE 124* 143*  BUN 18 10  CREATININE 0.74 0.53  CALCIUM 8.8* 8.8*   PT/INR Recent Labs    07/13/19 2156  LABPROT 15.4*  INR 1.2   CMP     Component Value Date/Time   NA 141 07/15/2019 0442   NA 143 06/22/2019 1300   K 3.4 (L) 07/15/2019 0442   CL 99 07/15/2019 0442   CO2 29 07/15/2019 0442   GLUCOSE 143 (H) 07/15/2019 0442   BUN 10 07/15/2019 0442   BUN 21 06/22/2019 1300   CREATININE 0.53 07/15/2019 0442   CREATININE 0.77 12/06/2012 1609   CALCIUM 8.8 (L) 07/15/2019 0442   PROT 6.6 07/13/2019 2120   PROT 6.7 06/22/2019 1300   ALBUMIN 3.5 07/13/2019 2120   ALBUMIN 4.2 06/22/2019 1300   AST 26 07/13/2019 2120   ALT 24 07/13/2019 2120   ALKPHOS 48 07/13/2019 2120   BILITOT 0.7 07/13/2019 2120   BILITOT 0.5 06/22/2019 1300   GFRNONAA >60 07/15/2019 0442   GFRNONAA 71 12/06/2012 1609   GFRAA >60 07/15/2019 0442   GFRAA 82 12/06/2012 1609  Lipase  No results found for: LIPASE     Studies/Results: DG Elbow Complete Left  Result Date: 07/13/2019 CLINICAL DATA:  Recent fall with left elbow pain, initial encounter EXAM: LEFT ELBOW - COMPLETE 3+ VIEW COMPARISON:  None. FINDINGS: There is no evidence of fracture, dislocation, or joint effusion. There is no evidence of arthropathy or other focal bone abnormality. Soft tissues are unremarkable. IMPRESSION: No acute abnormality noted. Electronically Signed   By: Inez Catalina M.D.   On: 07/13/2019 22:10   DG Forearm Left  Result Date: 07/13/2019 CLINICAL DATA:  Recent fall with left forearm  pain, initial encounter EXAM: LEFT FOREARM - 2 VIEW COMPARISON:  None. FINDINGS: No acute fracture or dislocation is noted. No soft tissue abnormality is noted. Degenerative changes of the radiocarpal joint are seen. Prior posttraumatic changes of the distal radius and ulna are seen. IMPRESSION: No acute abnormality noted. Electronically Signed   By: Inez Catalina M.D.   On: 07/13/2019 22:12   CT Head Wo Contrast  Result Date: 07/13/2019 CLINICAL DATA:  Unwitnessed mechanical fall EXAM: CT HEAD WITHOUT CONTRAST CT CERVICAL SPINE WITHOUT CONTRAST TECHNIQUE: Multidetector CT imaging of the head and cervical spine was performed following the standard protocol without intravenous contrast. Multiplanar CT image reconstructions of the cervical spine were also generated. COMPARISON:  06/26/2019 FINDINGS: CT HEAD FINDINGS Brain: There is a trace volume of subarachnoid hemorrhage along the para falcine left frontal lobe (axial series 1, image 15). There is no midline shift. Advanced atrophy and chronic microvascular ischemic changes are noted. There is a small subdural hematoma along the right frontoparietal convexity measuring up to approximately 4 mm in width (coronal series 3, image 39). Vascular: No hyperdense vessel or unexpected calcification. Skull: Normal. Negative for fracture or focal lesion. Sinuses/Orbits: No acute finding. Other: None. CT CERVICAL SPINE FINDINGS Alignment: Normal. Skull base and vertebrae: No acute fracture. No primary bone lesion or focal pathologic process. Soft tissues and spinal canal: No prevertebral fluid or swelling. No visible canal hematoma. Disc levels: Mild multilevel disc degenerative changes are noted, greatest at the C5-C6 and C6-C7 levels. Upper chest: Negative. Other: None IMPRESSION: 1. There is a small volume of acute mixed subdural and subarachnoid hemorrhage as detailed above. There is no evidence for midline shift or mass effect. 2. Advanced atrophy and chronic  microvascular ischemic changes are noted. 3. No acute cervical spine fracture. These results were called by telephone at the time of interpretation on 07/13/2019 at 9:50 pm to provider [facility] , who verbally acknowledged these results. Electronically Signed   By: Constance Holster M.D.   On: 07/13/2019 21:53   CT Cervical Spine Wo Contrast  Result Date: 07/13/2019 CLINICAL DATA:  Unwitnessed mechanical fall EXAM: CT HEAD WITHOUT CONTRAST CT CERVICAL SPINE WITHOUT CONTRAST TECHNIQUE: Multidetector CT imaging of the head and cervical spine was performed following the standard protocol without intravenous contrast. Multiplanar CT image reconstructions of the cervical spine were also generated. COMPARISON:  06/26/2019 FINDINGS: CT HEAD FINDINGS Brain: There is a trace volume of subarachnoid hemorrhage along the para falcine left frontal lobe (axial series 1, image 15). There is no midline shift. Advanced atrophy and chronic microvascular ischemic changes are noted. There is a small subdural hematoma along the right frontoparietal convexity measuring up to approximately 4 mm in width (coronal series 3, image 39). Vascular: No hyperdense vessel or unexpected calcification. Skull: Normal. Negative for fracture or focal lesion. Sinuses/Orbits: No acute finding. Other: None. CT CERVICAL SPINE FINDINGS Alignment:  Normal. Skull base and vertebrae: No acute fracture. No primary bone lesion or focal pathologic process. Soft tissues and spinal canal: No prevertebral fluid or swelling. No visible canal hematoma. Disc levels: Mild multilevel disc degenerative changes are noted, greatest at the C5-C6 and C6-C7 levels. Upper chest: Negative. Other: None IMPRESSION: 1. There is a small volume of acute mixed subdural and subarachnoid hemorrhage as detailed above. There is no evidence for midline shift or mass effect. 2. Advanced atrophy and chronic microvascular ischemic changes are noted. 3. No acute cervical spine  fracture. These results were called by telephone at the time of interpretation on 07/13/2019 at 9:50 pm to provider [facility] , who verbally acknowledged these results. Electronically Signed   By: Constance Holster M.D.   On: 07/13/2019 21:53   CT Elbow Left Wo Contrast  Result Date: 07/14/2019 CLINICAL DATA:  Elbow trauma sustained during an unwitnessed fall with suspected neurovascular ligamentous and tendinous injury. EXAM: CT OF THE UPPER LEFT EXTREMITY WITHOUT CONTRAST TECHNIQUE: Multidetector CT imaging of the upper left extremity was performed according to the standard protocol. Automatic exposure control utilized. COMPARISON:  None. FINDINGS: Bones/Joint/Cartilage A 4 mm calcific density in the anterior ulnohumeral joint space, likely a loose intra-articular body. CT artifact from patient arm positioning including in the region of the radial head, with possible subtle radial head nondisplaced fracture, sagittal image 47 and coronal image 31. Nondisplaced cortical avulsion from the olecranon process. No dislocation of the left elbow. Ligaments Suboptimally assessed by CT. Muscles and Tendons Probable thickening and partial-thickness injury of the triceps tendon insertion. Soft tissues Thickening and increased density in the left posterior elbow subcutaneous soft tissues, including in the region of the olecranon bursa. No radiopaque foreign body. No apparent vascular disruption. Elbow joint effusion with elevation of the anterior and posterior fat pads. IMPRESSION: Left elbow joint effusion and possible subtle radial head nondisplaced fracture. More sensitive evaluation with MR left elbow without contrast recommended. Probable partial-thickness injury of the left triceps tendon insertion with nondisplaced small cortical avulsion from the olecranon process. Contusion or edema in the left posterior elbow subcutaneous soft tissues, including in the region of the olecranon bursa. A 4 mm loose  intra-articular body in the anterior left ulnohumeral joint. No apparent vascular injury of the left elbow major vasculature. Electronically Signed   By: Revonda Humphrey   On: 07/14/2019 00:10   DG Chest Port 1 View  Result Date: 07/13/2019 CLINICAL DATA:  Recent fall with chest pain, initial encounter EXAM: PORTABLE CHEST 1 VIEW COMPARISON:  06/26/2019 FINDINGS: Cardiac shadow is stable. Aortic calcifications are noted. Lungs are hypoinflated but clear. Postsurgical changes in the left breast are noted. No bony abnormality is seen. IMPRESSION: Poor inspiratory effort without acute abnormality. Electronically Signed   By: Inez Catalina M.D.   On: 07/13/2019 22:24   DG Humerus Left  Result Date: 07/13/2019 CLINICAL DATA:  Recent fall with left arm pain, initial encounter EXAM: LEFT HUMERUS - 2+ VIEW COMPARISON:  None. FINDINGS: Degenerative changes of the glenohumeral articulation are noted. Bony densities are noted adjacent to the superior aspect of the glenoid likely representing loose bodies. No acute fracture or dislocation is noted. The underlying bony thorax is within normal limits. IMPRESSION: Degenerative changes without acute abnormality. Electronically Signed   By: Inez Catalina M.D.   On: 07/13/2019 22:13    Anti-infectives: Anti-infectives (From admission, onward)   None       Assessment/Plan DM HLD H/o CVA HTN H/o breast  cancer Fibromyalgia DNR Atrial fibrillation on eliquis - hold eliquis due to TBI  --above per TRH--  Fall Small left SAH, right SDH - per Dr. Zada Finders, holid eliquis 7 days, stable for d/c, f/u in office in 2 weeks L radial head fx - per Dr. Doreatha Martin, NWB LUE, unrestricted ROM, sling PRN comfort, f/u OP  ID - none FEN - HH/CM diet; increased scheduled tylenol to 1000 mg TID. VTE - SCDs Foley - none Follow up - NS, ortho, PCP  Plan - PT recommending SNF. No further recommendations from a trauma standpoint. We will sign off at this time   LOS: 2 days     Brigid Re , [facility] 07/15/2019, 9:05 AM Please see Amion for pager number during day hours 7:00am-4:30pm

## 2019-07-16 DIAGNOSIS — S065XAA Traumatic subdural hemorrhage with loss of consciousness status unknown, initial encounter: Secondary | ICD-10-CM

## 2019-07-16 DIAGNOSIS — S065X9A Traumatic subdural hemorrhage with loss of consciousness of unspecified duration, initial encounter: Secondary | ICD-10-CM

## 2019-07-16 HISTORY — DX: Traumatic subdural hemorrhage with loss of consciousness status unknown, initial encounter: S06.5XAA

## 2019-07-16 LAB — BASIC METABOLIC PANEL
Anion gap: 12 (ref 5–15)
BUN: 6 mg/dL — ABNORMAL LOW (ref 8–23)
CO2: 28 mmol/L (ref 22–32)
Calcium: 8.6 mg/dL — ABNORMAL LOW (ref 8.9–10.3)
Chloride: 100 mmol/L (ref 98–111)
Creatinine, Ser: 0.55 mg/dL (ref 0.44–1.00)
GFR calc Af Amer: >60 mL/min (ref >60)
GFR calc non Af Amer: >60 mL/min (ref >60)
Glucose, Bld: 144 mg/dL — ABNORMAL HIGH (ref 70–99)
Potassium: 3.2 mmol/L — ABNORMAL LOW (ref 3.5–5.1)
Sodium: 140 mmol/L (ref 135–145)

## 2019-07-16 LAB — CBC WITH DIFFERENTIAL/PLATELET
Abs Immature Granulocytes: 0.02 10*3/uL (ref 0.00–0.07)
Basophils Absolute: 0.1 10*3/uL (ref 0.0–0.1)
Basophils Relative: 1 %
Eosinophils Absolute: 0.5 10*3/uL (ref 0.0–0.5)
Eosinophils Relative: 7 %
HCT: 41.8 % (ref 36.0–46.0)
Hemoglobin: 13.1 g/dL (ref 12.0–15.0)
Immature Granulocytes: 0 %
Lymphocytes Relative: 11 %
Lymphs Abs: 0.9 10*3/uL (ref 0.7–4.0)
MCH: 25.9 pg — ABNORMAL LOW (ref 26.0–34.0)
MCHC: 31.3 g/dL (ref 30.0–36.0)
MCV: 82.8 fL (ref 80.0–100.0)
Monocytes Absolute: 0.8 10*3/uL (ref 0.1–1.0)
Monocytes Relative: 11 %
Neutro Abs: 5.5 10*3/uL (ref 1.7–7.7)
Neutrophils Relative %: 70 %
Platelets: 194 10*3/uL (ref 150–400)
RBC: 5.05 MIL/uL (ref 3.87–5.11)
RDW: 17.8 % — ABNORMAL HIGH (ref 11.5–15.5)
WBC: 7.8 10*3/uL (ref 4.0–10.5)
nRBC: 0 % (ref 0.0–0.2)

## 2019-07-16 LAB — GLUCOSE, CAPILLARY
Glucose-Capillary: 128 mg/dL — ABNORMAL HIGH (ref 70–99)
Glucose-Capillary: 139 mg/dL — ABNORMAL HIGH (ref 70–99)
Glucose-Capillary: 100 mg/dL — ABNORMAL HIGH (ref 70–99)
Glucose-Capillary: 121 mg/dL — ABNORMAL HIGH (ref 70–99)

## 2019-07-16 MED ORDER — ACETAMINOPHEN 500 MG PO TABS: 500.0000 mg | ORAL_TABLET | Freq: Four times a day (QID) | ORAL | 0 refills | Status: DC | PRN

## 2019-07-16 MED ORDER — IBUPROFEN 200 MG PO TABS: 200.0000 mg | ORAL_TABLET | Freq: Three times a day (TID) | ORAL | 0 refills | Status: DC | PRN

## 2019-07-16 MED ORDER — POTASSIUM CHLORIDE CRYS ER 20 MEQ PO TBCR
40.0000 meq | EXTENDED_RELEASE_TABLET | Freq: Once | ORAL | Status: AC
  Administered 2019-07-16: 40 meq via ORAL
  Filled 2019-07-16: qty 2

## 2019-07-16 NOTE — Plan of Care (Signed)
  Problem: Education: Goal: Knowledge of disease or condition will improve Outcome: Progressing  Reinforced no weight bearing on LUE.  Problem: Education: Goal: Knowledge of secondary prevention will improve Outcome: Progressing   Reviewed discharge plan to SNF for rehab.

## 2019-07-16 NOTE — Progress Notes (Signed)
I spoke with Dr. Ellard Artis (insurance company) for a peer to peer review, considering the patient's current level of participation in physical therapy she does not qualify for further physical therapy at a skilled nursing facility.   Patient continued to be very weak and deconditioning, certainly she is a high fall risk, her advanced age, left upper extremity fracture and cognitive impairment imposes a significant risk and poor prognosis.  Unsafe discharge.  Will continue to work with physical therapy/Occupational Therapy during her hospitalization, if patient progresses she may qualify for therapy at SNF.  I will contact to his son about the alternatives.  For now the discharge is canceled.

## 2019-07-16 NOTE — Discharge Summary (Addendum)
Physician Discharge Summary  BURKLEE BI [id number] DOB: Feb 10, 1926 DOA: 07/13/2019  PCP: Baruch Gouty, FNP  Admit date: 07/13/2019 Discharge date: 07/18/2019  Admitted From: SNF  Disposition:  SNF  Recommendations for Outpatient Follow-up and new medication changes:  1. Follow up with Baruch Gouty, FNP in 7 days.  2. Aspirin has been discontinued. 3. Patient to resume apixaban on 07/21/19.  4. Placed on as needed acetaminophen and ibuprofen for left arm pain. 5. Left upper extremity: Recommend nonweightbearing for now.  Unrestricted range of motion.  Sling for comfort. Follow with orthopedics as outpatient. Dr. Doreatha Martin.   Home Health: na   Equipment/Devices: na    Discharge Condition: stable  CODE STATUS: DNR   Diet recommendation: heart healthy and diabetic prudent.   Brief/Interim Summary: Patient was admitted to the hospital with a working diagnosis of traumatic left parafalcine frontal lobe subarachnoid hemorrhage and right frontal parietal subdural hematoma, complicated by left elbow nondisplaced radial head fracture.  86 year old female who presented after mechanical fall. She does have significant past medical history for atrial fibrillation, type 2 diabetes mellitus,hypertension and ischemic CVA. Patient fell from her own height while walking,apparently the floor was just cleaned and was slippery.Denies any loss of consciousness, butpositive neck and back pain post trauma. On her initial physical examination blood pressure 190/93, heart rate 83, respiratory rate 23, oxygen saturation 95%, her lungs were clear to auscultation bilaterally, heart S1-S2 present, irregularly irregular, abdomen was soft, no lower extremity edema.Patient is fully vaccinated for COVID-19. Sodium 137, potassium 4.2, chloride 100, bicarb 26, glucose 157, BUN 19, creatinine 0.95, white count 10.4, hemoglobin 14.5, hematocrit 46.8, platelets 264.Chest radiograph with right rotation,  increased lung markings bilaterally. EKG 73 bpm, normal axis, normal QRS, atrial fibrillation rhythm, no ST segment or T wave changes.X-rays from left upper extremity negative for fractures.   Head CT with trace volume of subarachnoid hemorrhage along the parafalcine left frontal lobe. Small subdural hematoma along the right frontoparietal convexity up to 4 mm. No midline shift. Cervical spine negative for acute changes. Left elbow CT with joint effusion and possible subtle radial head nondisplaced fracture.   Patient evaluated by neurosurgery, trauma service and orthopedics. Recommended continue conservative supportive care, admitted to progressive care unit for close neurologic monitoring.  Patient has remained stable, no neurologic changes, arm pain has been controlled and patient will return to SNF.  Patient OK to resume apixaban on 07/21/19, follow up with neurosurgery in 2 weeks.   1.  Traumatic left frontal and right parietal small parafalcine subdural hematomas, possibly some scattered traumatic subarachnoid hemorrhage in the left convexity.  Patient was initially admitted to the progressive care unit, she had frequent neuro checks, received analgesics, and was evaluated by neurosurgery.  Patient remains neurologically stable, recommendations to continue physical therapy, resume apixaban March 18, and follow-up with neurosurgery as an outpatient.  Patient was seen by physical therapy with recommendations to continue rehab at a skilled nursing facility.  2. Acute left radial head nondisplaced fracture.  Patient was seen by orthopedics, determined to be nonoperative elbow injury.  Recommended nonweightbearing, unrestricted range of motion and sling for comfort.  Appropriate analgesia was achieved with ibuprofen and acetaminophen.  3.  Chronic atrial fibrillation.  Patient remained rate controlled, anticoagulation will be held for a week post trauma, to resume apixaban on March  18.  4.  Hypertensive emergency.  Patient was resumed on her oral antihypertensives, achieving good blood pressure control.  5.  Type 2 diabetes  mellitus, hemoglobin A1c 6.3, dyslipidemia.  Patient was resumed on insulin therapy, she received a reduced dose of her basal insulin due to poor oral intake while hospitalized.  At discharge patient will resume her usual regimen including metformin.  Continue statin therapy.  6.  History of CVA.  Will continue statin therapy, for now we will hold on aspirin, due to recent intracranial bleed.  7. Chronic hypokalemia. Continue K supplementation. Renal function stable with serum cr at 0.55 and serum bicarbonate at 28.   Discharge Diagnoses:  Principal Problem:   Bilateral subdural hematomas (HCC) Active Problems:   Hypertension associated with diabetes ([place])   CVA (cerebral vascular accident) ([place])   Permanent atrial fibrillation ([place])   Type 2 diabetes mellitus with diabetic neuropathy, unspecified ([place])   Depression, recurrent ([place])   CHI (closed head injury), initial encounter   Left radial head fracture    Discharge Instructions   Allergies as of 07/16/2019      Reactions   Diltiazem Hcl Itching, Other (See Comments)   Red itchy rash started a few hours after taking short acting 120mg  dilt tabs   Ace Inhibitors Cough   Cymbalta [duloxetine Hcl] Anxiety, Other (See Comments)   Makes patient feel "restless and scared" and unable to sleep      Medication List    STOP taking these medications   aspirin 81 MG EC tablet   nabumetone 500 MG tablet Commonly known as: RELAFEN     TAKE these medications   acetaminophen 500 MG tablet Commonly known as: TYLENOL Take 1 tablet (500 mg total) by mouth every 6 (six) hours as needed for moderate pain.   apixaban 5 MG Tabs tablet Commonly known as: ELIQUIS Take 1 tablet (5 mg total) by mouth 2 (two) times daily.   atorvastatin 80 MG tablet Commonly known as: LIPITOR Take 1 tablet (80  mg total) by mouth daily at 6 PM. What changed: when to take this   butalbital-aspirin-caffeine 50-325-40 MG capsule Commonly known as: FIORINAL Take 1 capsule by mouth every 6 (six) hours as needed for headache.   DULoxetine 20 MG capsule Commonly known as: Cymbalta Take 1 capsule (20 mg total) by mouth daily.   glucose blood test strip Commonly known as: ONE TOUCH ULTRA TEST USE TO check blood sugars twice daily   hydrALAZINE 50 MG tablet Commonly known as: APRESOLINE Take 1 tablet (50 mg total) by mouth 2 (two) times daily. What changed: Another medication with the same name was removed. Continue taking this medication, and follow the directions you see here.   ibuprofen 200 MG tablet Commonly known as: ADVIL Take 1 tablet (200 mg total) by mouth every 8 (eight) hours as needed (left arm pain).   Insulin Pen Needle 31G X 8 MM Misc Use once daily with lantus solostar   Lantus SoloStar 100 UNIT/ML Solostar Pen Generic drug: insulin glargine INJECT 50 UNITS ONCE DAILY AT 10PM What changed: See the new instructions.   Lidocaine 4 % Ptch Apply 1 patch topically See admin instructions. Apply 1 patch to each hip daily for pain- on 12 hrs/off 12 hrs   Magnesium 250 MG Tabs Take 250 mg by mouth daily.   metFORMIN 500 MG tablet Commonly known as: GLUCOPHAGE TAKE (1) TABLET DAILY WITH BREAKFAST. What changed: See the new instructions.   omeprazole 20 MG capsule Commonly known as: PRILOSEC TAKE  (1)  CAPSULE  TWICE DAILY (TAKE ON AN EMPTY STOMACH AT LEAST 30 MIN- UTES BEFORE MEALS). What  changed:   how much to take  how to take this  when to take this  additional instructions   ondansetron 4 MG tablet Commonly known as: ZOFRAN TAKE 1 TABLET EVERY 8 HOURS AS NEEDED FOR NAUSEA & VOMITING What changed: See the new instructions.   potassium chloride 10 MEQ tablet Commonly known as: KLOR-CON Take 1 tablet (10 mEq total) by mouth daily. What changed: See the new  instructions.   rOPINIRole 0.25 MG tablet Commonly known as: REQUIP Take 1 tablet (0.25 mg total) by mouth 3 (three) times daily. Take one tablet in the morning, one tablet at noon and two tablets at bedtime. What changed:   when to take this  additional instructions   SUPER B COMPLEX/C PO Take 1 tablet by mouth daily.   Vitamin D3 50 MCG (2000 UT) Tabs Take 2,000 Units by mouth in the morning.      Follow-up Information    Haddix, Thomasene Lot, MD. Schedule an appointment as soon as possible for a visit in 2 week(s).   Specialty: Orthopedic Surgery Contact information: [place] [address] [phone]          Allergies  Allergen Reactions  . Diltiazem Hcl Itching and Other (See Comments)    Red itchy rash started a few hours after taking short acting 120mg  dilt tabs  . Ace Inhibitors Cough  . Cymbalta [Duloxetine Hcl] Anxiety and Other (See Comments)    Makes patient feel "restless and scared" and unable to sleep    Consultations:  Orthopedics  Neurosurgery   Trauma service    Procedures/Studies: CT ANGIO HEAD W OR WO CONTRAST  Result Date: 06/27/2019 CLINICAL DATA:  Stroke, follow up EXAM: CT ANGIOGRAPHY HEAD AND NECK TECHNIQUE: Multidetector CT imaging of the head and neck was performed using the standard protocol during bolus administration of intravenous contrast. Multiplanar CT image reconstructions and MIPs were obtained to evaluate the vascular anatomy. Carotid stenosis measurements (when applicable) are obtained utilizing NASCET criteria, using the distal internal carotid diameter as the denominator. CONTRAST:  81mL OMNIPAQUE IOHEXOL 350 MG/ML SOLN COMPARISON:  MRI of the brain June 26, 2019. FINDINGS: CT HEAD FINDINGS Brain: No evidence of new acute infarction, hemorrhage, hydrocephalus, extra-axial collection or mass lesion/mass effect. Small hypodensities in the left lentiform nucleus and thalamus corresponds to the recent infarct  seen on prior MRI. Old lacunar infarcts are noted in the right basal ganglia and thalamus. Prominent confluent hypodensity of the white matter of the cerebral hemispheres is most likely related to chronic small vessel ischemia. Vascular: Tree is Skull: Normal. Negative for fracture or focal lesion. Sinuses: Imaged portions are clear. Orbits: Bilateral eye surgery. Review of the MIP images confirms the above findings CTA NECK FINDINGS Aortic arch: Common origin of the innominate and left common carotid artery. Imaged portion shows no evidence of aneurysm or dissection. Calcified plaques are seen in the aortic arch and at the origin of the major neck arteries. No significant stenosis of the major arch vessel origins. Prominent tortuosity of the major neck arteries may be related to longstanding hypertension. Right carotid system: Mixed density plaque is noted in the right carotid bifurcation, without hemodynamically significant stenosis. Left carotid system: Mixed density plaque is noted in the left carotid bifurcation, without hemodynamically significant stenosis Vertebral arteries: Mixed density plaque is noted at the origin of the right vertebral artery with approximately 50% stenosis. The left vertebral artery is dominant and its origin is widely patent. There is increased  tortuosity and mild luminal irregularity of the cervical vertebral arteries without stenosis. Skeleton: Degenerative changes of the cervical spine are noted with prominent facet degeneration at C3-4 and C4-5. Other neck: 1.1 mm hypodense left thyroid lobe nodule. Upper chest: Negative Review of the MIP images confirms the above findings CTA HEAD FINDINGS Anterior circulation: Calcified plaques are seen in the bilateral carotid siphons extending to the supraclinoid segment on the right side where there is approximately 40% stenosis at the level of the terminus. No significant stenosis of the intracranial left ICA. There is early bifurcation of  the left MCA with mild to moderate stenosis at the origin of the superior division branch. The A1 segment of the right ACA is hypoplastic with a dominant left A1/ACA predominantly supplying both A2 segments. Mild luminal irregularity is noted throughout the bilateral MCA and ACA vascular trees, suggestive of intracranial atherosclerotic disease. A prominent right posterior communicating artery seen. Posterior circulation: The left vertebral artery is dominant. The basilar artery has normal course and caliber. The P1 segment of the right posterior cerebral artery is hypoplastic with most arterial flow coming from prominent posterior communicating artery (fetal PCA). Short segment of severe stenosis is noted at the P2 segment of the left posterior cerebral artery. There is also diffuse luminal irregularity throughout the bilateral PCA vascular trees, consistent with intracranial atherosclerotic disease. Venous sinuses: Minimally opacified given contrast timing. Anatomic variants: Right fetal PCA. Hypoplastic right A1/ACA with dominant left A1/ACA. Review of the MIP images confirms the above findings IMPRESSION: 1. Mixed density plaque in both carotid bulbs without hemodynamically significant stenosis. 2. A 50% stenosis at the origin of the right vertebral artery. 3. Short segment severe stenosis at the P2 segment of the left posterior cerebral artery. 4. Mild to moderate stenosis at the origin of the superior division left MCA branch. 5. A 40% stenosis of the supraclinoid right ICA. 6. Diffuse luminal irregularity throughout the bilateral MCA, ACA and PCA vascular trees, consistent with intracranial atherosclerotic disease. Aortic Atherosclerosis (ICD10-I70.0). Electronically Signed   By: Pedro Earls M.D.   On: 06/27/2019 13:12   DG Elbow Complete Left  Result Date: 07/13/2019 CLINICAL DATA:  Recent fall with left elbow pain, initial encounter EXAM: LEFT ELBOW - COMPLETE 3+ VIEW COMPARISON:   None. FINDINGS: There is no evidence of fracture, dislocation, or joint effusion. There is no evidence of arthropathy or other focal bone abnormality. Soft tissues are unremarkable. IMPRESSION: No acute abnormality noted. Electronically Signed   By: Inez Catalina M.D.   On: 07/13/2019 22:10   DG Forearm Left  Result Date: 07/13/2019 CLINICAL DATA:  Recent fall with left forearm pain, initial encounter EXAM: LEFT FOREARM - 2 VIEW COMPARISON:  None. FINDINGS: No acute fracture or dislocation is noted. No soft tissue abnormality is noted. Degenerative changes of the radiocarpal joint are seen. Prior posttraumatic changes of the distal radius and ulna are seen. IMPRESSION: No acute abnormality noted. Electronically Signed   By: Inez Catalina M.D.   On: 07/13/2019 22:12   CT Head Wo Contrast  Result Date: 07/13/2019 CLINICAL DATA:  Unwitnessed mechanical fall EXAM: CT HEAD WITHOUT CONTRAST CT CERVICAL SPINE WITHOUT CONTRAST TECHNIQUE: Multidetector CT imaging of the head and cervical spine was performed following the standard protocol without intravenous contrast. Multiplanar CT image reconstructions of the cervical spine were also generated. COMPARISON:  06/26/2019 FINDINGS: CT HEAD FINDINGS Brain: There is a trace volume of subarachnoid hemorrhage along the para falcine left frontal lobe (axial series  1, image 15). There is no midline shift. Advanced atrophy and chronic microvascular ischemic changes are noted. There is a small subdural hematoma along the right frontoparietal convexity measuring up to approximately 4 mm in width (coronal series 3, image 39). Vascular: No hyperdense vessel or unexpected calcification. Skull: Normal. Negative for fracture or focal lesion. Sinuses/Orbits: No acute finding. Other: None. CT CERVICAL SPINE FINDINGS Alignment: Normal. Skull base and vertebrae: No acute fracture. No primary bone lesion or focal pathologic process. Soft tissues and spinal canal: No prevertebral fluid or  swelling. No visible canal hematoma. Disc levels: Mild multilevel disc degenerative changes are noted, greatest at the C5-C6 and C6-C7 levels. Upper chest: Negative. Other: None IMPRESSION: 1. There is a small volume of acute mixed subdural and subarachnoid hemorrhage as detailed above. There is no evidence for midline shift or mass effect. 2. Advanced atrophy and chronic microvascular ischemic changes are noted. 3. No acute cervical spine fracture. These results were called by telephone at the time of interpretation on 07/13/2019 at 9:50 pm to provider [facility] , who verbally acknowledged these results. Electronically Signed   By: Constance Holster M.D.   On: 07/13/2019 21:53   CT Head Wo Contrast  Result Date: 06/26/2019 CLINICAL DATA:  Confusion/altered mental status, shaking EXAM: CT HEAD WITHOUT CONTRAST TECHNIQUE: Contiguous axial images were obtained from the base of the skull through the vertex without intravenous contrast. COMPARISON:  11/03/2018 FINDINGS: Motion degraded images. Brain: No evidence of acute infarction, hemorrhage, hydrocephalus, extra-axial collection or mass lesion/mass effect. Global cortical atrophy.  Secondary ventricular prominence. Extensive subcortical white matter and periventricular small vessel ischemic changes. Vascular: Intracranial atherosclerosis. Skull: Normal. Negative for fracture or focal lesion. Sinuses/Orbits: Right frontal sinus is underpneumatized. Visualized paranasal sinuses and mastoid air cells are otherwise clear. Other: None. IMPRESSION: Motion degraded images. No evidence of acute intracranial abnormality. Atrophy with small vessel ischemic changes. Electronically Signed   By: Julian Hy M.D.   On: 06/26/2019 16:30   CT ANGIO NECK W OR WO CONTRAST  Result Date: 06/27/2019 CLINICAL DATA:  Stroke, follow up EXAM: CT ANGIOGRAPHY HEAD AND NECK TECHNIQUE: Multidetector CT imaging of the head and neck was performed using the standard protocol  during bolus administration of intravenous contrast. Multiplanar CT image reconstructions and MIPs were obtained to evaluate the vascular anatomy. Carotid stenosis measurements (when applicable) are obtained utilizing NASCET criteria, using the distal internal carotid diameter as the denominator. CONTRAST:  43mL OMNIPAQUE IOHEXOL 350 MG/ML SOLN COMPARISON:  MRI of the brain June 26, 2019. FINDINGS: CT HEAD FINDINGS Brain: No evidence of new acute infarction, hemorrhage, hydrocephalus, extra-axial collection or mass lesion/mass effect. Small hypodensities in the left lentiform nucleus and thalamus corresponds to the recent infarct seen on prior MRI. Old lacunar infarcts are noted in the right basal ganglia and thalamus. Prominent confluent hypodensity of the white matter of the cerebral hemispheres is most likely related to chronic small vessel ischemia. Vascular: Tree is Skull: Normal. Negative for fracture or focal lesion. Sinuses: Imaged portions are clear. Orbits: Bilateral eye surgery. Review of the MIP images confirms the above findings CTA NECK FINDINGS Aortic arch: Common origin of the innominate and left common carotid artery. Imaged portion shows no evidence of aneurysm or dissection. Calcified plaques are seen in the aortic arch and at the origin of the major neck arteries. No significant stenosis of the major arch vessel origins. Prominent tortuosity of the major neck arteries may be related to longstanding hypertension. Right carotid system: Mixed  density plaque is noted in the right carotid bifurcation, without hemodynamically significant stenosis. Left carotid system: Mixed density plaque is noted in the left carotid bifurcation, without hemodynamically significant stenosis Vertebral arteries: Mixed density plaque is noted at the origin of the right vertebral artery with approximately 50% stenosis. The left vertebral artery is dominant and its origin is widely patent. There is increased tortuosity  and mild luminal irregularity of the cervical vertebral arteries without stenosis. Skeleton: Degenerative changes of the cervical spine are noted with prominent facet degeneration at C3-4 and C4-5. Other neck: 1.1 mm hypodense left thyroid lobe nodule. Upper chest: Negative Review of the MIP images confirms the above findings CTA HEAD FINDINGS Anterior circulation: Calcified plaques are seen in the bilateral carotid siphons extending to the supraclinoid segment on the right side where there is approximately 40% stenosis at the level of the terminus. No significant stenosis of the intracranial left ICA. There is early bifurcation of the left MCA with mild to moderate stenosis at the origin of the superior division branch. The A1 segment of the right ACA is hypoplastic with a dominant left A1/ACA predominantly supplying both A2 segments. Mild luminal irregularity is noted throughout the bilateral MCA and ACA vascular trees, suggestive of intracranial atherosclerotic disease. A prominent right posterior communicating artery seen. Posterior circulation: The left vertebral artery is dominant. The basilar artery has normal course and caliber. The P1 segment of the right posterior cerebral artery is hypoplastic with most arterial flow coming from prominent posterior communicating artery (fetal PCA). Short segment of severe stenosis is noted at the P2 segment of the left posterior cerebral artery. There is also diffuse luminal irregularity throughout the bilateral PCA vascular trees, consistent with intracranial atherosclerotic disease. Venous sinuses: Minimally opacified given contrast timing. Anatomic variants: Right fetal PCA. Hypoplastic right A1/ACA with dominant left A1/ACA. Review of the MIP images confirms the above findings IMPRESSION: 1. Mixed density plaque in both carotid bulbs without hemodynamically significant stenosis. 2. A 50% stenosis at the origin of the right vertebral artery. 3. Short segment severe  stenosis at the P2 segment of the left posterior cerebral artery. 4. Mild to moderate stenosis at the origin of the superior division left MCA branch. 5. A 40% stenosis of the supraclinoid right ICA. 6. Diffuse luminal irregularity throughout the bilateral MCA, ACA and PCA vascular trees, consistent with intracranial atherosclerotic disease. Aortic Atherosclerosis (ICD10-I70.0). Electronically Signed   By: Pedro Earls M.D.   On: 06/27/2019 13:12   CT Cervical Spine Wo Contrast  Result Date: 07/13/2019 CLINICAL DATA:  Unwitnessed mechanical fall EXAM: CT HEAD WITHOUT CONTRAST CT CERVICAL SPINE WITHOUT CONTRAST TECHNIQUE: Multidetector CT imaging of the head and cervical spine was performed following the standard protocol without intravenous contrast. Multiplanar CT image reconstructions of the cervical spine were also generated. COMPARISON:  06/26/2019 FINDINGS: CT HEAD FINDINGS Brain: There is a trace volume of subarachnoid hemorrhage along the para falcine left frontal lobe (axial series 1, image 15). There is no midline shift. Advanced atrophy and chronic microvascular ischemic changes are noted. There is a small subdural hematoma along the right frontoparietal convexity measuring up to approximately 4 mm in width (coronal series 3, image 39). Vascular: No hyperdense vessel or unexpected calcification. Skull: Normal. Negative for fracture or focal lesion. Sinuses/Orbits: No acute finding. Other: None. CT CERVICAL SPINE FINDINGS Alignment: Normal. Skull base and vertebrae: No acute fracture. No primary bone lesion or focal pathologic process. Soft tissues and spinal canal: No prevertebral fluid or  swelling. No visible canal hematoma. Disc levels: Mild multilevel disc degenerative changes are noted, greatest at the C5-C6 and C6-C7 levels. Upper chest: Negative. Other: None IMPRESSION: 1. There is a small volume of acute mixed subdural and subarachnoid hemorrhage as detailed above. There is no  evidence for midline shift or mass effect. 2. Advanced atrophy and chronic microvascular ischemic changes are noted. 3. No acute cervical spine fracture. These results were called by telephone at the time of interpretation on 07/13/2019 at 9:50 pm to provider [facility] [place] , who verbally acknowledged these results. Electronically Signed   By: Constance Holster M.D.   On: 07/13/2019 21:53   MR BRAIN WO CONTRAST  Result Date: 06/26/2019 CLINICAL DATA:  Altered mental status.  Confusion. EXAM: MRI HEAD WITHOUT CONTRAST TECHNIQUE: Multiplanar, multiecho pulse sequences of the brain and surrounding structures were obtained without intravenous contrast. COMPARISON:  Head CT same day.  MRI 05/28/2017. FINDINGS: Brain: The study does suffer from some motion degradation. There is a small focus of acute infarction at the inferior basal ganglia on the left. Very small focus of involvement of the medial left thalamus. No other acute infarction. Elsewhere, there chronic small-vessel ischemic changes affecting the pons. No focal cerebellar insult. Chronic small-vessel infarctions affect the thalami, and basal ganglia. Confluent chronic small vessel disease affects the cerebral hemispheric white matter. No mass lesion, hemorrhage, hydrocephalus or extra-axial collection. Vascular: Major vessels at the base of the brain show flow. Skull and upper cervical spine: Negative Sinuses/Orbits: Clear/normal Other: None IMPRESSION: Background pattern of extensive chronic small-vessel ischemic changes throughout the brain. Acute subcentimeter infarction at the inferior basal ganglia on the left without mass effect or hemorrhage. Few mm size focus of acute infarction of the medial left thalamus. Electronically Signed   By: Nelson Chimes M.D.   On: 06/26/2019 23:09   CT Elbow Left Wo Contrast  Result Date: 07/14/2019 CLINICAL DATA:  Elbow trauma sustained during an unwitnessed fall with suspected neurovascular ligamentous and tendinous  injury. EXAM: CT OF THE UPPER LEFT EXTREMITY WITHOUT CONTRAST TECHNIQUE: Multidetector CT imaging of the upper left extremity was performed according to the standard protocol. Automatic exposure control utilized. COMPARISON:  None. FINDINGS: Bones/Joint/Cartilage A 4 mm calcific density in the anterior ulnohumeral joint space, likely a loose intra-articular body. CT artifact from patient arm positioning including in the region of the radial head, with possible subtle radial head nondisplaced fracture, sagittal image 47 and coronal image 31. Nondisplaced cortical avulsion from the olecranon process. No dislocation of the left elbow. Ligaments Suboptimally assessed by CT. Muscles and Tendons Probable thickening and partial-thickness injury of the triceps tendon insertion. Soft tissues Thickening and increased density in the left posterior elbow subcutaneous soft tissues, including in the region of the olecranon bursa. No radiopaque foreign body. No apparent vascular disruption. Elbow joint effusion with elevation of the anterior and posterior fat pads. IMPRESSION: Left elbow joint effusion and possible subtle radial head nondisplaced fracture. More sensitive evaluation with MR left elbow without contrast recommended. Probable partial-thickness injury of the left triceps tendon insertion with nondisplaced small cortical avulsion from the olecranon process. Contusion or edema in the left posterior elbow subcutaneous soft tissues, including in the region of the olecranon bursa. A 4 mm loose intra-articular body in the anterior left ulnohumeral joint. No apparent vascular injury of the left elbow major vasculature. Electronically Signed   By: Revonda Humphrey   On: 07/14/2019 00:10   DG Chest Port 1 View  Result Date: 07/13/2019 CLINICAL  DATA:  Recent fall with chest pain, initial encounter EXAM: PORTABLE CHEST 1 VIEW COMPARISON:  06/26/2019 FINDINGS: Cardiac shadow is stable. Aortic calcifications are noted. Lungs are  hypoinflated but clear. Postsurgical changes in the left breast are noted. No bony abnormality is seen. IMPRESSION: Poor inspiratory effort without acute abnormality. Electronically Signed   By: Inez Catalina M.D.   On: 07/13/2019 22:24   DG Chest Portable 1 View  Result Date: 06/26/2019 CLINICAL DATA:  Altered mental status EXAM: PORTABLE CHEST 1 VIEW COMPARISON:  03/04/2019 FINDINGS: Lungs are clear.  No pleural effusion or pneumothorax. Skin fold at the right lung apex. The heart is normal in size. Prominent epicardial fat along the left heart border. Thoracic aortic atherosclerosis. IMPRESSION: No evidence of acute cardiopulmonary disease. Thoracic aortic atherosclerosis. Electronically Signed   By: Julian Hy M.D.   On: 06/26/2019 16:23   DG Humerus Left  Result Date: 07/13/2019 CLINICAL DATA:  Recent fall with left arm pain, initial encounter EXAM: LEFT HUMERUS - 2+ VIEW COMPARISON:  None. FINDINGS: Degenerative changes of the glenohumeral articulation are noted. Bony densities are noted adjacent to the superior aspect of the glenoid likely representing loose bodies. No acute fracture or dislocation is noted. The underlying bony thorax is within normal limits. IMPRESSION: Degenerative changes without acute abnormality. Electronically Signed   By: Inez Catalina M.D.   On: 07/13/2019 22:13   ECHOCARDIOGRAM COMPLETE  Result Date: 06/27/2019    ECHOCARDIOGRAM REPORT   Patient Name:   Evee A Tunnell Date of Exam: 06/27/2019 Medical Rec #:  [id number]        Height:       58.0 in Accession #:    [id number]       Weight:       153.0 lb Date of Birth:  08-24-1925        BSA:          1.625 m Patient Age:    80 years         BP:           183/96 mmHg Patient Gender: F                HR:           75 bpm. Exam Location:  Inpatient Procedure: 2D Echo, Cardiac Doppler and Color Doppler Indications:    Stroke 434.91  History:        Patient has prior history of Echocardiogram examinations, most                  recent 12/07/2017. CHF, Stroke; Risk Factors:Hypertension,                 Diabetes, Dyslipidemia and Non-Smoker. DOE. GERD.  Sonographer:    Vickie Epley RDCS Referring Phys: [place]  1. Left ventricular ejection fraction, by estimation, is 60 to 65%. The left ventricle has normal function. The left ventricle has no regional wall motion abnormalities.LV diastolic filling could not be assessed due to underlying atrial fibrillation.  2. Right ventricular systolic function is normal. The right ventricular size is normal.  3. The mitral valve is degenerative. No evidence of mitral valve regurgitation. No evidence of mitral stenosis.  4. The aortic valve is tricuspid. Aortic valve regurgitation is not visualized. Mild to moderate aortic valve sclerosis/calcification is present, without any evidence of aortic stenosis.  5. The inferior vena cava is normal in size with greater than 50% respiratory variability, suggesting right atrial  pressure of 3 mmHg.  6. Left atrial size was mild to moderately dilated. FINDINGS  Left Ventricle: Left ventricular ejection fraction, by estimation, is 60 to 65%. The left ventricle has normal function. The left ventricle has no regional wall motion abnormalities. The left ventricular internal cavity size was normal in size. There is  no left ventricular hypertrophy. LC diastolic filling could not be assessed due to underlying atrial fibrillation. Elevated left ventricular end-diastolic pressure. Right Ventricle: The right ventricular size is normal. No increase in right ventricular wall thickness. Right ventricular systolic function is normal. Left Atrium: Left atrial size was mild to moderately dilated. Right Atrium: Right atrial size was normal in size. Pericardium: There is no evidence of pericardial effusion. Mitral Valve: The mitral valve is degenerative in appearance. There is mild calcification of the anterior mitral valve leaflet(s). Normal mobility of the  mitral valve leaflets. No evidence of mitral valve regurgitation. No evidence of mitral valve stenosis. Tricuspid Valve: The tricuspid valve is normal in structure. Tricuspid valve regurgitation is trivial. No evidence of tricuspid stenosis. Aortic Valve: The aortic valve is tricuspid. . There is moderate thickening and mild calcification of the aortic valve. Aortic valve regurgitation is not visualized. Mild to moderate aortic valve sclerosis/calcification is present, without any evidence of aortic stenosis. There is moderate thickening of the aortic valve. There is mild calcification of the aortic valve. Pulmonic Valve: The pulmonic valve was normal in structure. Pulmonic valve regurgitation is not visualized. No evidence of pulmonic stenosis. Aorta: The aortic root is normal in size and structure. Venous: The inferior vena cava is normal in size with greater than 50% respiratory variability, suggesting right atrial pressure of 3 mmHg. IAS/Shunts: No atrial level shunt detected by color flow Doppler.  LEFT VENTRICLE PLAX 2D LVIDd:         5.20 cm  Diastology LVIDs:         3.40 cm  LV e' lateral:   8.84 cm/s LV PW:         0.90 cm  LV E/e' lateral: 14.6 LV IVS:        0.90 cm  LV e' medial:    6.08 cm/s LVOT diam:     1.70 cm  LV E/e' medial:  21.2 LV SV:         55.16 ml LV SV Index:   33.94 LVOT Area:     2.27 cm  RIGHT VENTRICLE RV S prime:     12.30 cm/s TAPSE (M-mode): 1.5 cm LEFT ATRIUM             Index LA diam:        5.20 cm 3.20 cm/m LA Vol (A2C):   58.2 ml 35.81 ml/m LA Vol (A4C):   53.3 ml 32.80 ml/m LA Biplane Vol: 56.4 ml 34.70 ml/m  AORTIC VALVE LVOT Vmax:   104.00 cm/s LVOT Vmean:  80.000 cm/s LVOT VTI:    0.243 m  AORTA Ao Root diam: 3.00 cm MITRAL VALVE MV Area (PHT): 3.17 cm     SHUNTS MV Decel Time: 239 msec     Systemic VTI:  0.24 m MV E velocity: 129.00 cm/s  Systemic Diam: 1.70 cm MV A velocity: 38.30 cm/s MV E/A ratio:  3.37 Fransico Him MD Electronically signed by Fransico Him MD  Signature Date/Time: 06/27/2019/12:22:29 PM    Final       Subjective: Patient is feeling well, no nausea or vomiting, no chest pain or dyspnea. Left elbow pain is controlled.  Patient has been out of bed with physical therapy.   Discharge Exam: Vitals:   07/16/19 0559 07/16/19 0625  BP: (!) 190/101 (!) 166/71  Pulse: 72 (!) 57  Resp:    Temp: 98.6 F (37 C)   SpO2: 96% 95%   Vitals:   07/15/19 2355 07/16/19 0459 07/16/19 0559 07/16/19 0625  BP: (!) 160/51 (!) 181/103 (!) 190/101 (!) 166/71  Pulse: 70 66 72 (!) 57  Resp: 16 20    Temp: 99 F (37.2 C) 98.9 F (37.2 C) 98.6 F (37 C)   TempSrc: Oral Oral Oral   SpO2:  95% 96% 95%  Weight:      Height:        General: Not in pain or dyspnea.  Neurology: Awake and alert, non focal  E ENT: no pallor, no icterus, oral mucosa moist Cardiovascular: No JVD. S1-S2 present, rhythmic, no gallops, rubs, or murmurs. No lower extremity edema. Pulmonary: positive breath sounds bilaterally, adequate air movement, no wheezing, rhonchi or rales. Gastrointestinal. Abdomen with no organomegaly, non tender, no rebound or guarding Skin. No rashes Musculoskeletal: no joint deformities/ left upper extremity sling in place.    The results of significant diagnostics from this hospitalization (including imaging, microbiology, ancillary and laboratory) are listed below for reference.     Microbiology: Recent Results (from the past 240 hour(s))  SARS CORONAVIRUS 2 (TAT 6-24 HRS) Nasopharyngeal Nasopharyngeal Swab     Status: None   Collection Time: 07/14/19  1:49 PM   Specimen: Nasopharyngeal Swab  Result Value Ref Range Status   SARS Coronavirus 2 NEGATIVE NEGATIVE Final    Comment: (NOTE) SARS-CoV-2 target nucleic acids are NOT DETECTED. The SARS-CoV-2 RNA is generally detectable in upper and lower respiratory specimens during the acute phase of infection. Negative results do not preclude SARS-CoV-2 infection, do not rule  out co-infections with other pathogens, and should not be used as the sole basis for treatment or other patient management decisions. Negative results must be combined with clinical observations, patient history, and epidemiological information. The expected result is Negative. Fact Sheet for Patients: SugarRoll.be Fact Sheet for Healthcare Providers: https://www.woods-mathews.com/ This test is not yet approved or cleared by the [place] FDA and  has been authorized for detection and/or diagnosis of SARS-CoV-2 by FDA under an Emergency Use Authorization (EUA). This EUA will remain  in effect (meaning this test can be used) for the duration of the COVID-19 declaration under Section 56 4(b)(1) of the Act, 21 U.S.C. section 360bbb-3(b)(1), unless the authorization is terminated or revoked sooner. Performed at [facility], [place] [address], [place], [place] [address]      Labs: BNP (last 3 results) No results for input(s): BNP in the last 8760 hours. Basic Metabolic Panel: Recent Labs  Lab 07/13/19 2120 07/14/19 0500 07/15/19 0442 07/16/19 0420  NA 137 141 141 140  K 4.2 3.8 3.4* 3.2*  CL 100 103 99 100  CO2 26 28 29 28   GLUCOSE 157* 124* 143* 144*  BUN 19 18 10  6*  CREATININE 0.95 0.74 0.53 0.55  CALCIUM 9.2 8.8* 8.8* 8.6*   Liver Function Tests: Recent Labs  Lab 07/13/19 2120  AST 26  ALT 24  ALKPHOS 48  BILITOT 0.7  PROT 6.6  ALBUMIN 3.5   No results for input(s): LIPASE, AMYLASE in the last 168 hours. No results for input(s): AMMONIA in the last 168 hours. CBC: Recent Labs  Lab 07/13/19 2120 07/14/19 0500 07/16/19 0420  WBC 10.4  10.2 7.8  NEUTROABS 7.8*  --  5.5  HGB 14.5 12.7 13.1  HCT 46.8* 41.1 41.8  MCV 84.3 85.4 82.8  PLT 264 230 194   Cardiac Enzymes: No results for input(s): CKTOTAL, CKMB, CKMBINDEX, TROPONINI in the last 168 hours. BNP: Invalid input(s): POCBNP CBG: Recent Labs  Lab  07/15/19 0829 07/15/19 1208 07/15/19 1530 07/15/19 2052 07/16/19 0608  GLUCAP 121* 198* 183* 130* 128*   D-Dimer No results for input(s): DDIMER in the last 72 hours. Hgb A1c No results for input(s): HGBA1C in the last 72 hours. Lipid Profile No results for input(s): CHOL, HDL, LDLCALC, TRIG, CHOLHDL, LDLDIRECT in the last 72 hours. Thyroid function studies No results for input(s): TSH, T4TOTAL, T3FREE, THYROIDAB in the last 72 hours.  Invalid input(s): FREET3 Anemia work up No results for input(s): VITAMINB12, FOLATE, FERRITIN, TIBC, IRON, RETICCTPCT in the last 72 hours. Urinalysis    Component Value Date/Time   COLORURINE YELLOW 06/26/2019 1547   APPEARANCEUR HAZY (A) 06/26/2019 1547   APPEARANCEUR Clear 03/04/2019 1702   LABSPEC 1.021 06/26/2019 1547   PHURINE 6.0 06/26/2019 1547   GLUCOSEU NEGATIVE 06/26/2019 1547   HGBUR NEGATIVE 06/26/2019 1547   BILIRUBINUR NEGATIVE 06/26/2019 1547   BILIRUBINUR Negative 03/04/2019 1702   KETONESUR NEGATIVE 06/26/2019 1547   PROTEINUR 100 (A) 06/26/2019 1547   UROBILINOGEN negative 07/03/2015 1351   UROBILINOGEN 1.0 05/14/2007 1320   NITRITE NEGATIVE 06/26/2019 1547   LEUKOCYTESUR NEGATIVE 06/26/2019 1547   Sepsis Labs Invalid input(s): PROCALCITONIN,  WBC,  LACTICIDVEN Microbiology Recent Results (from the past 240 hour(s))  SARS CORONAVIRUS 2 (TAT 6-24 HRS) Nasopharyngeal Nasopharyngeal Swab     Status: None   Collection Time: 07/14/19  1:49 PM   Specimen: Nasopharyngeal Swab  Result Value Ref Range Status   SARS Coronavirus 2 NEGATIVE NEGATIVE Final    Comment: (NOTE) SARS-CoV-2 target nucleic acids are NOT DETECTED. The SARS-CoV-2 RNA is generally detectable in upper and lower respiratory specimens during the acute phase of infection. Negative results do not preclude SARS-CoV-2 infection, do not rule out co-infections with other pathogens, and should not be used as the sole basis for treatment or other patient  management decisions. Negative results must be combined with clinical observations, patient history, and epidemiological information. The expected result is Negative. Fact Sheet for Patients: SugarRoll.be Fact Sheet for Healthcare Providers: https://www.woods-mathews.com/ This test is not yet approved or cleared by the [place] FDA and  has been authorized for detection and/or diagnosis of SARS-CoV-2 by FDA under an Emergency Use Authorization (EUA). This EUA will remain  in effect (meaning this test can be used) for the duration of the COVID-19 declaration under Section 56 4(b)(1) of the Act, 21 U.S.C. section 360bbb-3(b)(1), unless the authorization is terminated or revoked sooner. Performed at [facility], [place] [address], [place], [place] [address]      Time coordinating discharge: 45 minutes  SIGNED:   Tawni Millers, MD  [facility] 07/16/2019, 8:18 AM

## 2019-07-16 NOTE — Progress Notes (Signed)
Occupational Therapy Treatment Patient Details Name: [name] MRN: [id number] DOB: [birth year] Today's Date: 07/16/2019    History of present illness Pt is a 86 y/o female admitted secondary to fall at SNF. Found to have subarachnoid hemorrhage, L subtle radial head fracture, and L partial thickness triceps tendon injury. PMH includes HTN, DM and a fib.    OT comments  Pt progressing well towards acute OT goals. Focus of session was functional transfers and mobility. Pt intially completed stand-pivot transfer to simulate toilet transfer to [facility] with min A. After brief sitting rest break pt then walked about 5' in the room with close chair follow provided and +2 for safety/equipment. Extra time and effort but ultimately min A to steady during transfers and mobility. Pt resting comfortably in recliner at end of session. Tolerated session well. Feel pt will progress well with therapy goals and d/c recommendation for back to SNF for rehab remains appropriate from OT standpoint.    Follow Up Recommendations  SNF    Equipment Recommendations  Other (comment)(defer to next venue)    Recommendations for Other Services      Precautions / Restrictions Precautions Precautions: Fall Precaution Comments: Had fall at SNF  Required Braces or Orthoses: Sling Restrictions Weight Bearing Restrictions: Yes LUE Weight Bearing: Non weight bearing       Mobility Bed Mobility Overal bed mobility: Needs Assistance Bed Mobility: Supine to Sit     Supine to sit: Mod assist;Min assist;HOB elevated     General bed mobility comments: cues for technique. Exited on pt's right side. Able to hold onto right bed rail to faciliate powerup up. assist to pivot hips to full EOB position.   Transfers Overall transfer level: Needs assistance Equipment used: 1 person hand held assist Transfers: Sit to/from Stand Sit to Stand: Min assist;+2 safety/equipment         General transfer comment: Cues for  technique. single extremity support and gait belt with min A to steady. +2 for safetyt/equipment.     Balance Overall balance assessment: Needs assistance Sitting-balance support: Single extremity supported;Feet supported Sitting balance-Leahy Scale: Fair Sitting balance - Comments: able to sit EOB at min guard level with single extremity support   Standing balance support: Single extremity supported;During functional activity Standing balance-Leahy Scale: Poor Standing balance comment: single extremity support in static standing. min A for dynamic standing/walking.                            ADL either performed or assessed with clinical judgement   ADL Overall ADL's : Needs assistance/impaired                         Toilet Transfer: Minimal assistance;+2 for safety/equipment;Stand-pivot Toilet Transfer Details (indicate cue type and reason): simulated with EOB to recliner.          Functional mobility during ADLs: Minimal assistance;+2 for safety/equipment General ADL Comments: Pt completed bed mobility, SPT to recliner to simulate toilet transfer to [facility]. After seated rest break pt walked about 5" in the room. +2 for chair follow.     Vision       Perception     Praxis      Cognition Arousal/Alertness: Awake/alert Behavior During Therapy: WFL for tasks assessed/performed Overall Cognitive Status: No family/caregiver present to determine baseline cognitive functioning Area of Impairment: Memory;Problem solving;Safety/judgement;Following commands  Current Attention Level: Sustained Memory: Decreased short-term memory Following Commands: Follows one step commands with increased time;Follows one step commands inconsistently Safety/Judgement: Decreased awareness of deficits Awareness: Intellectual Problem Solving: Slow processing;Difficulty sequencing;Requires verbal cues          Exercises     Shoulder Instructions        General Comments      Pertinent Vitals/ Pain       Pain Assessment: Faces Faces Pain Scale: Hurts little more Pain Location: LUE  Pain Descriptors / Indicators: Guarding;Sore Pain Intervention(s): Limited activity within patient's tolerance;Monitored during session;Repositioned  Home Living                                          Prior Functioning/Environment              Frequency  Min 2X/week        Progress Toward Goals  OT Goals(current goals can now be found in the care plan section)  Progress towards OT goals: Progressing toward goals  Acute Rehab OT Goals Patient Stated Goal: walk better OT Goal Formulation: With patient Time For Goal Achievement: 07/29/19 Potential to Achieve Goals: Good ADL Goals Pt Will Perform Grooming: with set-up;sitting Pt Will Perform Lower Body Dressing: with set-up;with supervision;sit to/from stand Pt Will Transfer to Toilet: with mod assist;bedside commode Pt Will Perform Toileting - Clothing Manipulation and hygiene: with set-up;with supervision;sit to/from stand;sitting/lateral leans Additional ADL Goal #1: pt will demonstrate don doff sling mod I Additional ADL Goal #2: pt will demonstrates positioning of L UE in sling on pillow with AROM of digits mod I  Plan Discharge plan remains appropriate    Co-evaluation                 AM-PAC OT "6 Clicks" Daily Activity     Outcome Measure   Help from another person eating meals?: A Little Help from another person taking care of personal grooming?: A Little Help from another person toileting, which includes using toliet, bedpan, or urinal?: A Lot Help from another person bathing (including washing, rinsing, drying)?: A Lot Help from another person to put on and taking off regular upper body clothing?: A Lot Help from another person to put on and taking off regular lower body clothing?: A Lot 6 Click Score: 14    End of Session Equipment  Utilized During Treatment: Gait belt;Other (comment)(sling on LUE)  OT Visit Diagnosis: Unsteadiness on feet (R26.81);Other abnormalities of gait and mobility (R26.89);Muscle weakness (generalized) (M62.81)   Activity Tolerance Patient tolerated treatment well   Patient Left in chair;with call bell/phone within reach;with chair alarm set   Nurse Communication Mobility status        Time: [id number] OT Time Calculation (min): 31 min  Charges: OT General Charges $OT Visit: 1 Visit OT Treatments $Self Care/Home Management : 23-37 mins  Tyrone Schimke, OT Acute Rehabilitation Services Pager: [phone] Office: [phone]    [name] 07/16/2019, 2:23 PM

## 2019-07-16 NOTE — NC FL2 (Signed)
[place] MEDICAID FL2 LEVEL OF CARE SCREENING TOOL     IDENTIFICATION  Patient Name: [name] Birthdate: Jun 28, 1925 Sex: female Admission Date (Current Location): 07/13/2019  [facility] and [place] Number:  Herbalist and Address:  [facility], [place] [address] [place], [place] [address]      Provider Number: [id number]  Attending Physician Name and Address:  Tawni Millers,*  Relative Name and Phone Number:  [name] [phone]    Current Level of Care: Hospital Recommended Level of Care: [place] Prior Approval Number:    Date Approved/Denied:   PASRR Number: [id number]  Discharge Plan: SNF    Current Diagnoses: Patient Active Problem List   Diagnosis Date Noted  . Bilateral subdural hematomas ([place]) 07/16/2019  . CHI (closed head injury), initial encounter 07/14/2019  . Left radial head fracture 07/14/2019  . SDH (subdural hematoma) ([place])   . Hypertensive urgency   . Subarachnoid hemorrhage following injury ([place]) 07/13/2019  . Acute encephalopathy 06/27/2019  . Heat stroke 06/27/2019  . Hypokalemia 06/27/2019  . Fever, unspecified 06/27/2019  . Stroke ([place]) 06/27/2019  . Altered mental status 06/26/2019  . Right hip pain 06/22/2019  . Chronic left hip pain 05/25/2019  . Depression, recurrent ([place]) 05/25/2019  . Chronic anticoagulation 04/19/2019  . Leg cramps 03/04/2019  . Short-term memory loss 03/04/2019  . DOE (dyspnea on exertion) 03/04/2019  . Aortic atherosclerosis ([place]) 03/04/2019  . Physical deconditioning 01/24/2019  . History of recurrent UTIs 12/03/2018  . Chronic pain of right knee 10/08/2018  . Type 2 diabetes mellitus with diabetic neuropathy, unspecified ([place]) 08/12/2018  . Pulmonary vascular congestion   . Chronic diastolic CHF (congestive heart failure) ([place])   . Acute hypoxemic respiratory failure ([place]) 12/06/2017  . Permanent atrial fibrillation ([place]) 12/06/2017  .  Trochanteric bursitis, right hip 07/16/2017  . Other intervertebral disc degeneration, lumbar region 07/16/2017  . Osteoporosis 11/04/2016  . Diabetic polyneuropathy associated with type 2 diabetes mellitus ([place]) 11/14/2014  . Fibromyalgia 02/11/2013  . CVA (cerebral vascular accident) ([place]) 08/01/2008  . Osteoarthritis 04/09/2007  . Hyperlipidemia associated with type 2 diabetes mellitus ([place]) 03/08/2007  . Hypertension associated with diabetes ([place]) 03/08/2007  . Pulmonary fibrosis ([place]) 03/08/2007  . GERD 03/08/2007  . BREAST CANCER, HX OF 03/08/2007    Orientation RESPIRATION BLADDER Height & Weight     Self, Time, Situation, Place  Normal Incontinent, External catheter Weight: 153 lb (69.4 kg) Height:  4\' 11"  (149.9 cm)  BEHAVIORAL SYMPTOMS/MOOD NEUROLOGICAL BOWEL NUTRITION STATUS      Continent Diet(See discharge summary)  AMBULATORY STATUS COMMUNICATION OF NEEDS Skin   Extensive Assist Verbally Normal                       Personal Care Assistance Level of Assistance  Bathing, Feeding, Dressing Bathing Assistance: Limited assistance Feeding assistance: Limited assistance Dressing Assistance: Limited assistance     Functional Limitations Info  Sight, Hearing, Speech Sight Info: Adequate Hearing Info: Adequate Speech Info: Adequate    SPECIAL CARE FACTORS FREQUENCY  PT (By licensed PT), OT (By licensed OT)     PT Frequency: 5x a week OT Frequency: 5x a week            Contractures Contractures Info: Not present    Additional Factors Info  Code Status, Allergies Code Status Info: DNR Allergies Info: Diltiazem Hcl; Ace Inhibitors; Cymbalta (duloxetine Hcl)  Current Medications (07/16/2019):  This is the current hospital active medication list Current Facility-Administered Medications  Medication Dose Route Frequency Provider Last Rate Last Admin  . acetaminophen (TYLENOL) tablet 1,000 mg  1,000 mg Oral Q8H Rayburn, Kelly A, PA-C   1,000  mg at 07/16/19 0544  . atorvastatin (LIPITOR) tablet 80 mg  80 mg Oral q1800 Opyd, Ilene Qua, MD   80 mg at 07/15/19 1735  . hydrALAZINE (APRESOLINE) tablet 50 mg  50 mg Oral BID Vianne Bulls, MD   50 mg at 07/16/19 0837  . ibuprofen (ADVIL) tablet 200 mg  200 mg Oral TID Tawni Millers, MD   200 mg at 07/16/19 0837  . insulin aspart (novoLOG) injection 0-9 Units  0-9 Units Subcutaneous TID WC Blount, Xenia T, NP      . insulin glargine (LANTUS) injection 12 Units  12 Units Subcutaneous QHS Tawni Millers, MD   12 Units at 07/15/19 2220  . labetalol (NORMODYNE) injection 10 mg  10 mg Intravenous Q2H PRN Opyd, Ilene Qua, MD   10 mg at 07/16/19 0600  . methocarbamol (ROBAXIN) tablet 500 mg  500 mg Oral Q8H PRN Meuth, Brooke A, PA-C      . morphine 2 MG/ML injection 2 mg  2 mg Intravenous Q4H PRN Meuth, Brooke A, PA-C   2 mg at 07/14/19 1426  . ondansetron (ZOFRAN) injection 4 mg  4 mg Intravenous Q6H PRN Opyd, Ilene Qua, MD      . oxyCODONE (Oxy IR/ROXICODONE) immediate release tablet 5 mg  5 mg Oral Q4H PRN Meuth, Brooke A, PA-C      . pantoprazole (PROTONIX) EC tablet 40 mg  40 mg Oral BID Opyd, Ilene Qua, MD   40 mg at 07/16/19 0837  . polyethylene glycol (MIRALAX / GLYCOLAX) packet 17 g  17 g Oral Daily PRN Opyd, Ilene Qua, MD      . rOPINIRole (REQUIP) tablet 0.25 mg  0.25 mg Oral BID WC Arrien, Jimmy Picket, MD   0.25 mg at 07/16/19 [id number]   And  . rOPINIRole (REQUIP) tablet 0.5 mg  0.5 mg Oral QHS Arrien, Jimmy Picket, MD   0.5 mg at 07/15/19 2347  . senna-docusate (Senokot-S) tablet 1 tablet  1 tablet Oral BID Opyd, Ilene Qua, MD   1 tablet at 07/16/19 0837  . sodium chloride flush (NS) 0.9 % injection 3 mL  3 mL Intravenous Q12H Opyd, Ilene Qua, MD   3 mL at 07/16/19 [id number]     Discharge Medications: Please see discharge summary for a list of discharge medications.  Relevant Imaging Results:  Relevant Lab Results:   Additional Information SSN  [id number]  [place], [name]

## 2019-07-16 NOTE — TOC Initial Note (Signed)
Transition of Care [facility]) - Initial/Assessment Note    Patient Details  Name: [name] MRN: [id number] Date of Birth: 1926/02/19  [facility] CM/SW Contact:    Arvella Merles, [name] Phone Number: 07/16/2019, 9:58 AM  Clinical Narrative:                 CSW received consult for possible SNF placement for patient who was admitted from [facility].  Patient and family are in agreement for patient to return to SNF.   CSW followed up with [employer] for insurance authorization and informed authorization was declined. CSW informed MD and a peer-to-peer review was completed. It was determined if patient progresses with PT over the next couple of they, insurance will reconsider authorization for rehab at SNF.   CSW contacted patient's son [name] to provide an update and also updated SNF. TOC team will continue to follow and assist with discharge planning needs.   Expected Discharge Plan: Skilled Nursing Facility Barriers to Discharge: Insurance Authorization   Patient Goals and CMS Choice   CMS Medicare.gov Compare Post Acute Care list provided to:: Patient Choice offered to / list presented to : Patient  Expected Discharge Plan and Services Expected Discharge Plan: [place]       Living arrangements for the past 2 months: [place] Expected Discharge Date: 07/16/19                                    Prior Living Arrangements/Services Living arrangements for the past 2 months: [place] Lives with:: Self Patient language and need for interpreter reviewed:: Yes Do you feel safe going back to the place where you live?: Yes      Need for Family Participation in Patient Care: No (Comment) Care giver support system in place?: Yes (comment)   Criminal Activity/Legal Involvement Pertinent to Current Situation/Hospitalization: No - Comment as needed  Activities of Daily Living       Permission Sought/Granted Permission sought to share information with : Facility Sport and exercise psychologist, Family Supports    Share Information with NAME: [name]  Permission granted to share info w AGENCY: SNF  Permission granted to share info w Relationship: Son  Permission granted to share info w Contact Information: [phone]  Emotional Assessment   Attitude/Demeanor/Rapport: Unable to Assess Affect (typically observed): Unable to Assess Orientation: : Oriented to Self, Oriented to  Time, Oriented to Place, Oriented to Situation Alcohol / Substance Use: Not Applicable Psych Involvement: No (comment)  Admission diagnosis:  Subarachnoid hemorrhage following injury ([place]) [S06.6X9A] SDH (subdural hematoma) ([place]) [S06.5X9A] Fall, initial encounter [W19.XXXA] Patient Active Problem List   Diagnosis Date Noted  . Bilateral subdural hematomas ([place]) 07/16/2019  . CHI (closed head injury), initial encounter 07/14/2019  . Left radial head fracture 07/14/2019  . SDH (subdural hematoma) ([place])   . Hypertensive urgency   . Subarachnoid hemorrhage following injury ([place]) 07/13/2019  . Acute encephalopathy 06/27/2019  . Heat stroke 06/27/2019  . Hypokalemia 06/27/2019  . Fever, unspecified 06/27/2019  . Stroke ([place]) 06/27/2019  . Altered mental status 06/26/2019  . Right hip pain 06/22/2019  . Chronic left hip pain 05/25/2019  . Depression, recurrent ([place]) 05/25/2019  . Chronic anticoagulation 04/19/2019  . Leg cramps 03/04/2019  . Short-term memory loss 03/04/2019  . DOE (dyspnea on exertion) 03/04/2019  . Aortic atherosclerosis ([place]) 03/04/2019  . Physical deconditioning 01/24/2019  .  History of recurrent UTIs 12/03/2018  . Chronic pain of right knee 10/08/2018  . Type 2 diabetes mellitus with diabetic neuropathy, unspecified ([place]) 08/12/2018  . Pulmonary vascular congestion   . Chronic diastolic CHF (congestive heart failure) ([place])   . Acute hypoxemic respiratory failure ([place])  12/06/2017  . Permanent atrial fibrillation ([place]) 12/06/2017  . Trochanteric bursitis, right hip 07/16/2017  . Other intervertebral disc degeneration, lumbar region 07/16/2017  . Osteoporosis 11/04/2016  . Diabetic polyneuropathy associated with type 2 diabetes mellitus ([place]) 11/14/2014  . Fibromyalgia 02/11/2013  . CVA (cerebral vascular accident) ([place]) 08/01/2008  . Osteoarthritis 04/09/2007  . Hyperlipidemia associated with type 2 diabetes mellitus ([place]) 03/08/2007  . Hypertension associated with diabetes ([place]) 03/08/2007  . Pulmonary fibrosis ([place]) 03/08/2007  . GERD 03/08/2007  . BREAST CANCER, HX OF 03/08/2007   PCP:  Baruch Gouty, FNP Pharmacy:   [facility], [place] [address] Phone: [phone] Fax: [phone]     Social Determinants of Health (SDOH) Interventions    Readmission Risk Interventions No flowsheet data found.

## 2019-07-17 LAB — GLUCOSE, CAPILLARY
Glucose-Capillary: 126 mg/dL — ABNORMAL HIGH (ref 70–99)
Glucose-Capillary: 127 mg/dL — ABNORMAL HIGH (ref 70–99)
Glucose-Capillary: 125 mg/dL — ABNORMAL HIGH (ref 70–99)
Glucose-Capillary: 205 mg/dL — ABNORMAL HIGH (ref 70–99)

## 2019-07-17 MED ORDER — POTASSIUM CHLORIDE CRYS ER 10 MEQ PO TBCR
10.0000 meq | EXTENDED_RELEASE_TABLET | Freq: Every day | ORAL | Status: DC
  Administered 2019-07-17 – 2019-07-18 (×2): 10 meq via ORAL
  Filled 2019-07-17 (×2): qty 1

## 2019-07-17 NOTE — Progress Notes (Signed)
Physical Therapy Treatment Patient Details Name: [name] MRN: [id number] DOB: January 30, 1926 Today's Date: 07/17/2019    History of Present Illness Pt is a 86 y/o female admitted secondary to fall at SNF. Found to have subarachnoid hemorrhage, L subtle radial head fracture, and L partial thickness triceps tendon injury. PMH includes HTN, DM and a fib.     PT Comments    Pt is progressing with therapy - she walked 10 feet today with HHAx1 and left UE in sling -  and chair following.  She has narrow base and short shuffling strides. She is motivated to get better and requested me to come back and walk her again today.  I still feel pt would benefit from daily SNF PT to help her improve her mobility and safety when up and to help her reduce her risk of falls.  Pt should progress steadily but slowly with skilled PT intervention.   Follow Up Recommendations  SNF;Supervision/Assistance - 24 hour     Equipment Recommendations  None recommended by PT    Recommendations for Other Services       Precautions / Restrictions Precautions Precautions: Fall Precaution Comments: Had fall at SNF .  pt is afraid of falling Required Braces or Orthoses: Sling Restrictions Weight Bearing Restrictions: Yes LUE Weight Bearing: Non weight bearing    Mobility  Bed Mobility Overal bed mobility: Needs Assistance Bed Mobility: Supine to Sit     Supine to sit: Min assist;Mod assist     General bed mobility comments: cues for technque and to help pt get trunk up with HOB raised.  Pt reminded not to try to use L UE  Transfers Overall transfer level: Needs assistance Equipment used: 1 person hand held assist Transfers: Sit to/from Stand Sit to Stand: Min assist         General transfer comment: Cues for technique. single extremity support and gait belt with min A to steady.  Ambulation/Gait Ambulation/Gait assistance: Min assist;+2 safety/equipment Gait Distance (Feet): 10 Feet Assistive  device: 1 person hand held assist(chair following) Gait Pattern/deviations: Decreased step length - right;Decreased step length - left;Shuffle;Decreased dorsiflexion - right;Decreased dorsiflexion - left;Narrow base of support     General Gait Details: first stood EOB and worked on weight shifting side to side, then worked on Warden/ranger in place - hard but improved. pt sat and rested and then stood and walked 10 feet with chiar following.  Pt with shift strides and narrow base. she is afraid of falling but did well with me supporting her   Stairs             Wheelchair Mobility    Modified [name] (Stroke Patients Only)       Balance Overall balance assessment: Needs assistance Sitting-balance support: Single extremity supported;Feet supported Sitting balance-Leahy Scale: Good Sitting balance - Comments: able to sit EOB safely with single extremity support     Standing balance-Leahy Scale: Poor Standing balance comment: single extremity support in static standing. min A for dynamic standing/walking.                             Cognition Arousal/Alertness: Awake/alert Behavior During Therapy: WFL for tasks assessed/performed Overall Cognitive Status: No family/caregiver present to determine baseline cognitive functioning Area of Impairment: Memory;Problem solving;Safety/judgement;Following commands                       Following Commands: Follows one step  commands with increased time;Follows one step commands inconsistently Safety/Judgement: Decreased awareness of deficits     General Comments: pt cooperative and pleasant  - she asked me to come back soon as she wants to walk more      Exercises      General Comments        Pertinent Vitals/Pain Pain Assessment: 0-10 Pain Score: 3  Pain Location: LUE  Pain Descriptors / Indicators: Guarding;Sore Pain Intervention(s): Monitored during session;Repositioned    Home Living                       Prior Function            PT Goals (current goals can now be found in the care plan section) Progress towards PT goals: Progressing toward goals    Frequency    Min 3X/week      PT Plan Current plan remains appropriate    Co-evaluation              AM-PAC PT "6 Clicks" Mobility   Outcome Measure  Help needed turning from your back to your side while in a flat bed without using bedrails?: A Little Help needed moving from lying on your back to sitting on the side of a flat bed without using bedrails?: A Little Help needed moving to and from a bed to a chair (including a wheelchair)?: A Little Help needed standing up from a chair using your arms (e.g., wheelchair or bedside chair)?: A Little Help needed to walk in hospital room?: A Lot Help needed climbing 3-5 steps with a railing? : Total 6 Click Score: 15    End of Session Equipment Utilized During Treatment: Gait belt Activity Tolerance: Patient limited by fatigue Patient left: in chair;with chair alarm set;with call bell/phone within reach Nurse Communication: Mobility status PT Visit Diagnosis: Unsteadiness on feet (R26.81);Other abnormalities of gait and mobility (R26.89);History of falling (Z91.81)     Time: 1405-1430 PT Time Calculation (min) (ACUTE ONLY): 25 min  Charges:  $Gait Training: 23-37 mins                     07/17/2019   [name], PT    [name] 07/17/2019, 2:43 PM

## 2019-07-17 NOTE — Progress Notes (Addendum)
PROGRESS NOTE    [name]  [id number] DOB: [birth year] DOA: 07/13/2019 PCP: Baruch Gouty, FNP    Brief Narrative:  Patient was admitted to the hospital with a working diagnosis of traumatic left parafalcine frontal lobe subarachnoid hemorrhage and right frontal parietal subdural hematoma, complicated by left elbow nondisplaced radial head fracture.  86 year old female who presented after mechanical fall. She does have significant past medical history for atrial fibrillation, type 2 diabetes mellitus,hypertension and ischemic CVA. Patient fell from her own height while walking,apparently the floor was just cleaned and was slippery.Denies any loss of consciousness, butpositive neck and back pain post trauma. On her initial physical examination blood pressure 190/93, heart rate 83, respiratory rate 23, oxygen saturation 95%, her lungs were clear to auscultation bilaterally, heart S1-S2 present, irregularly irregular, abdomen was soft, no lower extremity edema.Patient is fully vaccinated for COVID-19. Sodium 137, potassium 4.2, chloride 100, bicarb 26, glucose 157, BUN 19, creatinine 0.95, white count 10.4, hemoglobin 14.5, hematocrit 46.8, platelets 264.Chest radiograph with right rotation, increased lung markings bilaterally. EKG 73 bpm, normal axis, normal QRS, atrial fibrillation rhythm, no ST segment or T wave changes.X-rays from left upper extremity negative for fractures.   Head CT with trace volume of subarachnoid hemorrhage along the parafalcine left frontal lobe. Small subdural hematoma along the right frontoparietal convexity up to 4 mm. No midline shift. Cervical spine negative for acute changes. Left elbow CT with joint effusion and possible subtle radial head nondisplaced fracture.   Patient evaluated by neurosurgery, trauma service and orthopedics. Recommended continue conservative supportive care, admitted to progressive care unit for close neurologic  monitoring.  Patient has remained stable, no neurologic changes, arm pain has been controlled and patient will return to SNF.  Patient OK to resume apixaban on 07/21/19, follow up with neurosurgery in 2 weeks.   Her discharge was cancelled due to patient not qualifying for SNF physical therapy. Continue PT and OT assessment while hospitalized, currently unsafe discharge.    Assessment & Plan:   Principal Problem:   Bilateral subdural hematomas (HCC) Active Problems:   Hypertension associated with diabetes ([place])   CVA (cerebral vascular accident) ([place])   Permanent atrial fibrillation ([place])   Type 2 diabetes mellitus with diabetic neuropathy, unspecified ([place])   Depression, recurrent ([place])   CHI (closed head injury), initial encounter   Left radial head fracture   1.  Traumatic left frontal and right parietal small parafalcine subdural hematomas, possibly some scattered traumatic subarachnoid hemorrhage in the left convexity.  Patient continue to remain neurologically stable, but very weak and deconditioned. Plan to resume apixaban March 18, and follow-up with neurosurgery as an outpatient.   2. Acute left radial head nondisplaced fracture.  She has a  nonoperative elbow injury.  Recommendations for nonweightbearing, unrestricted range of motion and sling for comfort.  Continue pain control with ibuprofen and acetaminophen. Continue physical therapy and occupational therapy assessment, possible return to snf in am, other options is home health or hospice services.   3.  Chronic atrial fibrillation.  Rate controlled, continue anticoagulation with apixaban starting on March 18.  4.  Hypertensive emergency.  Blood pressure continue to be well controlled. Continue with hydralazine.   5.  Type 2 diabetes mellitus, hemoglobin A1c 6.3, dyslipidemia.  Continue with insulin therapy, sliding scale and a lower dose of basal insulin than her usual regimen. Her fasting glucose this am is  144 mg/dl  6.  History of CVA.  Continue with statin therapy, continue physical  therapy and occupational therapy.  7. Chronic hypokalemia. Continue K supplementation, 10 meq daily of Kcl. Renal function has been stable.    Consultants:   Trauma   Neurosurgery   Subjective: Patient continue to be very weak and deconditioned, left arm pain has improved, no nausea or vomiting, has been out of bed to chair,.   Objective: Vitals:   07/16/19 1641 07/16/19 1940 07/17/19 0005 07/17/19 0442  BP: (!) 164/81 (!) 172/92 (!) 149/77 (!) 141/73  Pulse: (!) 58 66 69 66  Resp: 19 17 18 18   Temp: 98.4 F (36.9 C) 97.8 F (36.6 C) 98 F (36.7 C) 98.3 F (36.8 C)  TempSrc: Oral Oral Oral Oral  SpO2: 100% 98% 98% 99%  Weight:      Height:        Intake/Output Summary (Last 24 hours) at 07/17/2019 1132 Last data filed at 07/17/2019 0007 Gross per 24 hour  Intake --  Output 503 ml  Net -503 ml   Filed Weights   07/13/19 2130  Weight: 69.4 kg    Examination:   General: Not in pain or dyspnea, deconditioned  Neurology: Awake and alert, non focal  E ENT: no pallor, no icterus, oral mucosa moist Cardiovascular: No JVD. S1-S2 present, rhythmic, no gallops, rubs, or murmurs. No lower extremity edema. Pulmonary: positive breath sounds bilaterally, adequate air movement, no wheezing, rhonchi or rales. Gastrointestinal. Abdomen with, no organomegaly, non tender, no rebound or guarding Skin. No rashes Musculoskeletal: left arm in sling.      Data Reviewed: I have personally reviewed following labs and imaging studies  CBC: Recent Labs  Lab 07/13/19 2120 07/14/19 0500 07/16/19 0420  WBC 10.4 10.2 7.8  NEUTROABS 7.8*  --  5.5  HGB 14.5 12.7 13.1  HCT 46.8* 41.1 41.8  MCV 84.3 85.4 82.8  PLT 264 230 [id number]   Basic Metabolic Panel: Recent Labs  Lab 07/13/19 2120 07/14/19 0500 07/15/19 0442 07/16/19 0420  NA 137 141 141 140  K 4.2 3.8 3.4* 3.2*  CL 100 103 99 100  CO2 26  28 29 28   GLUCOSE 157* 124* 143* 144*  BUN 19 18 10  6*  CREATININE 0.95 0.74 0.53 0.55  CALCIUM 9.2 8.8* 8.8* 8.6*   GFR: Estimated Creatinine Clearance: 38.8 mL/min (by C-G formula based on SCr of 0.55 mg/dL). Liver Function Tests: Recent Labs  Lab 07/13/19 2120  AST 26  ALT 24  ALKPHOS 48  BILITOT 0.7  PROT 6.6  ALBUMIN 3.5   No results for input(s): LIPASE, AMYLASE in the last 168 hours. No results for input(s): AMMONIA in the last 168 hours. Coagulation Profile: Recent Labs  Lab 07/13/19 2156  INR 1.2   Cardiac Enzymes: No results for input(s): CKTOTAL, CKMB, CKMBINDEX, TROPONINI in the last 168 hours. BNP (last 3 results) No results for input(s): PROBNP in the last 8760 hours. HbA1C: No results for input(s): HGBA1C in the last 72 hours. CBG: Recent Labs  Lab 07/16/19 0608 07/16/19 1204 07/16/19 1645 07/16/19 2115 07/17/19 0610  GLUCAP 128* 139* 100* 121* 126*   Lipid Profile: No results for input(s): CHOL, HDL, LDLCALC, TRIG, CHOLHDL, LDLDIRECT in the last 72 hours. Thyroid Function Tests: No results for input(s): TSH, T4TOTAL, FREET4, T3FREE, THYROIDAB in the last 72 hours. Anemia Panel: No results for input(s): VITAMINB12, FOLATE, FERRITIN, TIBC, IRON, RETICCTPCT in the last 72 hours.    Radiology Studies: I have reviewed all of the imaging during this hospital visit personally  Scheduled Meds: . acetaminophen  1,000 mg Oral Q8H  . atorvastatin  80 mg Oral q1800  . hydrALAZINE  50 mg Oral BID  . ibuprofen  200 mg Oral TID  . insulin aspart  0-9 Units Subcutaneous TID WC  . insulin glargine  12 Units Subcutaneous QHS  . pantoprazole  40 mg Oral BID  . rOPINIRole  0.25 mg Oral BID WC   And  . rOPINIRole  0.5 mg Oral QHS  . senna-docusate  1 tablet Oral BID  . sodium chloride flush  3 mL Intravenous Q12H   Continuous Infusions:   LOS: 4 days        Chadrick Sprinkle Gerome Apley, MD

## 2019-07-18 DIAGNOSIS — R58 Hemorrhage, not elsewhere classified: Secondary | ICD-10-CM | POA: Diagnosis not present

## 2019-07-18 DIAGNOSIS — N390 Urinary tract infection, site not specified: Secondary | ICD-10-CM | POA: Diagnosis not present

## 2019-07-18 DIAGNOSIS — Z743 Need for continuous supervision: Secondary | ICD-10-CM | POA: Diagnosis not present

## 2019-07-18 DIAGNOSIS — R531 Weakness: Secondary | ICD-10-CM | POA: Diagnosis not present

## 2019-07-18 DIAGNOSIS — E1159 Type 2 diabetes mellitus with other circulatory complications: Secondary | ICD-10-CM | POA: Diagnosis not present

## 2019-07-18 DIAGNOSIS — I4891 Unspecified atrial fibrillation: Secondary | ICD-10-CM | POA: Diagnosis not present

## 2019-07-18 DIAGNOSIS — R279 Unspecified lack of coordination: Secondary | ICD-10-CM | POA: Diagnosis not present

## 2019-07-18 DIAGNOSIS — R319 Hematuria, unspecified: Secondary | ICD-10-CM | POA: Diagnosis not present

## 2019-07-18 DIAGNOSIS — S52122D Displaced fracture of head of left radius, subsequent encounter for closed fracture with routine healing: Secondary | ICD-10-CM | POA: Diagnosis not present

## 2019-07-18 DIAGNOSIS — Z853 Personal history of malignant neoplasm of breast: Secondary | ICD-10-CM | POA: Diagnosis not present

## 2019-07-18 DIAGNOSIS — E1169 Type 2 diabetes mellitus with other specified complication: Secondary | ICD-10-CM | POA: Diagnosis not present

## 2019-07-18 DIAGNOSIS — Z7901 Long term (current) use of anticoagulants: Secondary | ICD-10-CM | POA: Diagnosis not present

## 2019-07-18 DIAGNOSIS — M8949 Other hypertrophic osteoarthropathy, multiple sites: Secondary | ICD-10-CM | POA: Diagnosis not present

## 2019-07-18 DIAGNOSIS — S0083XA Contusion of other part of head, initial encounter: Secondary | ICD-10-CM | POA: Diagnosis not present

## 2019-07-18 DIAGNOSIS — W19XXXA Unspecified fall, initial encounter: Secondary | ICD-10-CM | POA: Diagnosis not present

## 2019-07-18 DIAGNOSIS — E1140 Type 2 diabetes mellitus with diabetic neuropathy, unspecified: Secondary | ICD-10-CM | POA: Diagnosis not present

## 2019-07-18 DIAGNOSIS — E1165 Type 2 diabetes mellitus with hyperglycemia: Secondary | ICD-10-CM | POA: Diagnosis not present

## 2019-07-18 DIAGNOSIS — Z79899 Other long term (current) drug therapy: Secondary | ICD-10-CM | POA: Diagnosis not present

## 2019-07-18 DIAGNOSIS — Z8673 Personal history of transient ischemic attack (TIA), and cerebral infarction without residual deficits: Secondary | ICD-10-CM | POA: Diagnosis not present

## 2019-07-18 DIAGNOSIS — D649 Anemia, unspecified: Secondary | ICD-10-CM | POA: Diagnosis not present

## 2019-07-18 DIAGNOSIS — S52125D Nondisplaced fracture of head of left radius, subsequent encounter for closed fracture with routine healing: Secondary | ICD-10-CM | POA: Diagnosis not present

## 2019-07-18 DIAGNOSIS — M6281 Muscle weakness (generalized): Secondary | ICD-10-CM | POA: Diagnosis not present

## 2019-07-18 DIAGNOSIS — I69398 Other sequelae of cerebral infarction: Secondary | ICD-10-CM | POA: Diagnosis not present

## 2019-07-18 DIAGNOSIS — E785 Hyperlipidemia, unspecified: Secondary | ICD-10-CM | POA: Diagnosis not present

## 2019-07-18 DIAGNOSIS — Z96653 Presence of artificial knee joint, bilateral: Secondary | ICD-10-CM | POA: Diagnosis not present

## 2019-07-18 DIAGNOSIS — S065X0D Traumatic subdural hemorrhage without loss of consciousness, subsequent encounter: Secondary | ICD-10-CM | POA: Diagnosis not present

## 2019-07-18 DIAGNOSIS — R278 Other lack of coordination: Secondary | ICD-10-CM | POA: Diagnosis not present

## 2019-07-18 DIAGNOSIS — S42402A Unspecified fracture of lower end of left humerus, initial encounter for closed fracture: Secondary | ICD-10-CM | POA: Diagnosis not present

## 2019-07-18 DIAGNOSIS — R040 Epistaxis: Secondary | ICD-10-CM | POA: Diagnosis not present

## 2019-07-18 DIAGNOSIS — I10 Essential (primary) hypertension: Secondary | ICD-10-CM | POA: Diagnosis not present

## 2019-07-18 DIAGNOSIS — Z794 Long term (current) use of insulin: Secondary | ICD-10-CM | POA: Diagnosis not present

## 2019-07-18 LAB — GLUCOSE, CAPILLARY
Glucose-Capillary: 124 mg/dL — ABNORMAL HIGH (ref 70–99)
Glucose-Capillary: 119 mg/dL — ABNORMAL HIGH (ref 70–99)

## 2019-07-18 MED ORDER — AMLODIPINE BESYLATE 5 MG PO TABS
5.0000 mg | ORAL_TABLET | Freq: Every day | ORAL | Status: DC
  Administered 2019-07-18: 5 mg via ORAL
  Filled 2019-07-18: qty 1

## 2019-07-18 NOTE — Plan of Care (Signed)
Patient stable, discussed POC with patient, agreeable with plan, denies question/concerns at this time.  

## 2019-07-18 NOTE — Progress Notes (Signed)
Call report to receiving facility after IV access was removed. Report given to Lavone Orn, RN. Transported by [facility] in good spirits with no complaints.

## 2019-07-18 NOTE — TOC Transition Note (Signed)
Transition of Care [facility]) - CM/SW Discharge Note   Patient Details  Name: [name] MRN: [id number] Date of Birth: Jun 08, 1925  Transition of Care [facility]) CM/SW Contact:  Emeterio Reeve, [place] Phone Number:[phone] 07/18/2019, 12:41 PM   Clinical Narrative:    Nurse to call report to [phone]. Room 24   Final next level of care: Skilled Nursing Facility Barriers to Discharge: Insurance Authorization   Patient Goals and CMS Choice   CMS Medicare.gov Compare Post Acute Care list provided to:: Patient Choice offered to / list presented to : Patient  Discharge Placement                       Discharge Plan and Services                                     Social Determinants of Health (SDOH) Interventions     Readmission Risk Interventions No flowsheet data found.   Emeterio Reeve, Latanya Presser, [place] Licensed Clinical Social Worker [phone]

## 2019-07-18 NOTE — Plan of Care (Signed)
  Problem: Education: Goal: Knowledge of patient specific risk factors addressed and post discharge goals established will improve Outcome: Progressing   Problem: Education: Goal: Knowledge of disease or condition will improve Outcome: Progressing   

## 2019-07-18 NOTE — Progress Notes (Signed)
Patient has remained stable, her left arm pain is better controlled and she has been able to work with physical therapy.  VS BP 164/72, HR 77, RR16, 02 sat 95 RA.  Lung clear to auscultation, heart S1 and S2 present and rhythmic, abdomen soft and no lower extremity edema.  Patient stable for dc to SNF.

## 2019-07-19 ENCOUNTER — Telehealth: Payer: Self-pay | Admitting: *Deleted

## 2019-07-19 NOTE — Telephone Encounter (Signed)
   Transitional Care Note Hospital Discharge to [facility] 07/19/2019 Reviewed By: Eston Mould, LPN  [name] was admitted to [facility] on 07/13/19 with Bilateral Subdural Hematomas.  she was transferred from [facility] to [facility] on 07/18/19.  she will be followed by the facility's staff physicians while there and will need to follow-up with Rakes, Connye Burkitt, FNP (PCP) within two weeks of discharge from SNF for Transitional Care Management.   Plan . Clinical staff will monitor discharge reports and contact patient to initiate TCM within 2 business days of SNF discharge.  . After discharge CCM team will collaborate with PCP regarding potential CCM eligibility and the benefits CCM services provide the patient. The primary goal would be to increase patient self-management of chronic medical conditions resulting in improved patient health outcomes, decreased exacerbations of chronic medical conditions, and decreased ED visits/hospital admissions.

## 2019-07-21 DIAGNOSIS — S0083XA Contusion of other part of head, initial encounter: Secondary | ICD-10-CM | POA: Diagnosis not present

## 2019-07-21 DIAGNOSIS — S42402A Unspecified fracture of lower end of left humerus, initial encounter for closed fracture: Secondary | ICD-10-CM | POA: Diagnosis not present

## 2019-07-28 ENCOUNTER — Ambulatory Visit: Payer: Self-pay | Admitting: Licensed Clinical Social Worker

## 2019-07-28 DIAGNOSIS — E1169 Type 2 diabetes mellitus with other specified complication: Secondary | ICD-10-CM | POA: Diagnosis not present

## 2019-07-28 DIAGNOSIS — E1140 Type 2 diabetes mellitus with diabetic neuropathy, unspecified: Secondary | ICD-10-CM

## 2019-07-28 DIAGNOSIS — M8949 Other hypertrophic osteoarthropathy, multiple sites: Secondary | ICD-10-CM | POA: Diagnosis not present

## 2019-07-28 DIAGNOSIS — E1159 Type 2 diabetes mellitus with other circulatory complications: Secondary | ICD-10-CM

## 2019-07-28 DIAGNOSIS — I10 Essential (primary) hypertension: Secondary | ICD-10-CM

## 2019-07-28 DIAGNOSIS — E785 Hyperlipidemia, unspecified: Secondary | ICD-10-CM

## 2019-07-28 DIAGNOSIS — Z794 Long term (current) use of insulin: Secondary | ICD-10-CM

## 2019-07-28 DIAGNOSIS — K219 Gastro-esophageal reflux disease without esophagitis: Secondary | ICD-10-CM

## 2019-07-28 DIAGNOSIS — M797 Fibromyalgia: Secondary | ICD-10-CM

## 2019-07-28 NOTE — Chronic Care Management (AMB) (Signed)
  Care Management Note   [name] is a 86 y.o. year old female who is a primary care patient of Baruch Gouty, FNP. The CM team was consulted for assistance with chronic disease management and care coordination.   I reached out to [name] by phone today.    Review of patient status, including review of consultants reports, relevant laboratory and other test results, and collaboration with appropriate care team members and the patient's provider was performed as part of comprehensive patient evaluation and provision of chronic care management services.   Social determinants of health: risk of social isolation; risk of depression; risk of stress    Office Visit from 05/25/2019 in [facility]  PHQ-9 Total Score  13     Medications   acetaminophen (TYLENOL) 500 MG tablet apixaban (ELIQUIS) 5 MG TABS tablet atorvastatin (LIPITOR) 80 MG tablet butalbital-aspirin-caffeine (FIORINAL) 50-325-40 MG capsule Cholecalciferol (VITAMIN D3) 50 MCG (2000 UT) TABS DULoxetine (CYMBALTA) 20 MG capsule(Expired) glucose blood (ONE TOUCH ULTRA TEST) test strip hydrALAZINE (APRESOLINE) 50 MG tablet(Expired) ibuprofen (ADVIL) 200 MG tablet Insulin Pen Needle 31G X 8 MM MISC LANTUS SOLOSTAR 100 UNIT/ML Solostar Pen Lidocaine 4 % PTCH Magnesium 250 MG TABS metFORMIN (GLUCOPHAGE) 500 MG tablet omeprazole (PRILOSEC) 20 MG capsule ondansetron (ZOFRAN) 4 MG tablet potassium chloride (KLOR-CON) 10 MEQ tablet rOPINIRole (REQUIP) 0.25 MG tablet(Expired) SUPER B COMPLEX/C PO  Goals    . "I wish I could get out of the house and go to the grocery store" (pt-stated)     Current Barriers:   Lacks caregiver support.    Social isolation in patient with Chronic Diagnoses of Fibromyalgia, Type 2 DM, Hyperlipidemia, Osteoarthritis, GERD, and HTN   Nurse Case Manager Clinical Goal(s):  Marland Kitchen Over the next 30 days, patient will work with CM clinical social worker to address  psychosocial issues related to social isolation.   Interventions:   Talked with [name], son, about client needs at current placement (she is currently receiving care at [facility] in [place], [place])  Talked with [name] about client physical therapy sessions received at facility  Talked with [name] about working with discharge planner at facility when it is time for client to make plans for discharge  Talked with [name] about pain issues of client  Talked with [name] about appetite of client  Patient Self Care Activities:  . Self administers medications as prescribed . Attends all scheduled provider appointments . Calls pharmacy for medication refills . Attends church or other social activities . Performs ADL's independently . Performs IADL's independently . Calls provider office for new concerns or questions  Initial goal documentation     Follow Up Plan:[name] to call client [name], son, in next 4 weeks to assess the psychosocial needs of client at that time  Norva Riffle.Darrin Koman MSW, [name] Licensed Clinical Social Worker [facility] Care Management [phone]

## 2019-07-28 NOTE — Patient Instructions (Addendum)
Licensed Clinical Social Worker Visit Information  Goals we discussed today:  Goals    . "I wish I could get out of the house and go to the grocery store" (pt-stated)     Current Barriers:   Lacks caregiver support.    Social isolation in patient with Chronic Diagnoses of Fibromyalgia, Type 2 DM, Hyperlipidemia, Osteoarthritis, GERD, and HTN   Nurse Case Manager Clinical Goal(s):  Marland Kitchen Over the next 30 days, patient will work with CM clinical social worker to address psychosocial issues related to social isolation.   Interventions:   Talked with [name], son, about client needs at current placement (she is currently receiving care at [facility] in [place], [place])  Talked with [name] about client physical therapy sessions received at facility  Talked with [name] about working with discharge planner at facility when it is time for client to make plans for discharge  Talked with [name] about pain issues of client  Talked with [name] about appetite of client .  Patient Self Care Activities:  . Self administers medications as prescribed . Attends all scheduled provider appointments . Calls pharmacy for medication refills . Attends church or other social activities . Performs ADL's independently . Performs IADL's independently . Calls provider office for new concerns or questions  Initial goal documentation     Materials Provided: No  Follow Up Plan:[name] to call client/[name], son,in next 4 weeks to assess the psychosocial needs of client at that time  The patient/[name], son, verbalized understanding of instructions provided today and declined a print copy of patient instruction materials.   Norva Riffle.Morse Brueggemann MSW, [name] Licensed Clinical Social Worker [facility] Care Management [phone]

## 2019-07-29 ENCOUNTER — Other Ambulatory Visit: Payer: Self-pay | Admitting: *Deleted

## 2019-07-29 MED ORDER — APIXABAN 5 MG PO TABS: 5.0000 mg | ORAL_TABLET | Freq: Two times a day (BID) | ORAL | 0 refills | Status: DC

## 2019-08-01 DIAGNOSIS — S52122D Displaced fracture of head of left radius, subsequent encounter for closed fracture with routine healing: Secondary | ICD-10-CM | POA: Diagnosis not present

## 2019-08-02 ENCOUNTER — Emergency Department (HOSPITAL_COMMUNITY)
Admission: EM | Admit: 2019-08-02 | Discharge: 2019-08-02 | Disposition: A | Discharge status: Home / Self Care | Payer: PPO | Attending: Emergency Medicine | Admitting: Emergency Medicine

## 2019-08-02 ENCOUNTER — Encounter (HOSPITAL_COMMUNITY): Payer: Self-pay

## 2019-08-02 DIAGNOSIS — E1140 Type 2 diabetes mellitus with diabetic neuropathy, unspecified: Secondary | ICD-10-CM | POA: Diagnosis not present

## 2019-08-02 DIAGNOSIS — I10 Essential (primary) hypertension: Secondary | ICD-10-CM | POA: Insufficient documentation

## 2019-08-02 DIAGNOSIS — Z853 Personal history of malignant neoplasm of breast: Secondary | ICD-10-CM | POA: Insufficient documentation

## 2019-08-02 DIAGNOSIS — Z7901 Long term (current) use of anticoagulants: Secondary | ICD-10-CM | POA: Diagnosis not present

## 2019-08-02 DIAGNOSIS — Z96653 Presence of artificial knee joint, bilateral: Secondary | ICD-10-CM | POA: Diagnosis not present

## 2019-08-02 DIAGNOSIS — R040 Epistaxis: Secondary | ICD-10-CM | POA: Diagnosis not present

## 2019-08-02 DIAGNOSIS — Z794 Long term (current) use of insulin: Secondary | ICD-10-CM | POA: Diagnosis not present

## 2019-08-02 DIAGNOSIS — Z8673 Personal history of transient ischemic attack (TIA), and cerebral infarction without residual deficits: Secondary | ICD-10-CM | POA: Diagnosis not present

## 2019-08-02 DIAGNOSIS — E1165 Type 2 diabetes mellitus with hyperglycemia: Secondary | ICD-10-CM | POA: Diagnosis not present

## 2019-08-02 DIAGNOSIS — Z79899 Other long term (current) drug therapy: Secondary | ICD-10-CM | POA: Diagnosis not present

## 2019-08-02 MED ORDER — OXYMETAZOLINE HCL 0.05 % NA SOLN
1.0000 | Freq: Once | NASAL | Status: AC
  Administered 2019-08-02: 1 via NASAL
  Filled 2019-08-02: qty 30

## 2019-08-02 NOTE — ED Notes (Signed)
PTAR at bedside 

## 2019-08-02 NOTE — ED Triage Notes (Signed)
Patient arrived via [facility] from [facility].   C/O nose bleed that started this morning around 0600 am.   Staff called out after 30 min after unable to control with pressure.   Denies injury or hx of nose bleeds  Takes eliquis.   Ems gave 1 spray of Afrin in each nostril  at 0700am and nose bleed has stopped.   Patient has felt a little nauseous from blood running down throat. No emesis at this time.   BP-166/86 P-84 95% RA    A/Ox4 Ambulatory with ems

## 2019-08-02 NOTE — ED Notes (Signed)
PT[facility] has been dispatched.  

## 2019-08-02 NOTE — ED Provider Notes (Signed)
[facility] DEPT Provider Note   CSN: [id number] Arrival date & time: 08/02/19  [id number]     History Chief Complaint  Patient presents with  . Epistaxis    [name] is a 86 y.o. female.  The history is provided by the patient.  Epistaxis Location:  Unable to specify Severity:  Mild Progression:  Resolved Chronicity:  New Context: anticoagulants   Relieved by: afrin, pressure with EMS. Worsened by:  Nothing Associated symptoms: no blood in oropharynx, no congestion, no cough, no dizziness, no facial pain, no fever, no headaches, no sinus pain, no sneezing, no sore throat and no syncope   Risk factors: no frequent nosebleeds        Past Medical History:  Diagnosis Date  . Arthritis   . Atrial fibrillation ([place])   . Cancer ([place])    breast  . Cataract   . Diabetes mellitus without complication ([place])   . Frequent UTI   . Hyperlipidemia   . Hypertension   . Neck pain   . Scoliosis   . Stroke [facility])     Patient Active Problem List   Diagnosis Date Noted  . Bilateral subdural hematomas ([place]) 07/16/2019  . CHI (closed head injury), initial encounter 07/14/2019  . Left radial head fracture 07/14/2019  . SDH (subdural hematoma) ([place])   . Hypertensive urgency   . Subarachnoid hemorrhage following injury ([place]) 07/13/2019  . Acute encephalopathy 06/27/2019  . Heat stroke 06/27/2019  . Hypokalemia 06/27/2019  . Fever, unspecified 06/27/2019  . Stroke ([place]) 06/27/2019  . Altered mental status 06/26/2019  . Right hip pain 06/22/2019  . Chronic left hip pain 05/25/2019  . Depression, recurrent ([place]) 05/25/2019  . Chronic anticoagulation 04/19/2019  . Leg cramps 03/04/2019  . Short-term memory loss 03/04/2019  . DOE (dyspnea on exertion) 03/04/2019  . Aortic atherosclerosis ([place]) 03/04/2019  . Physical deconditioning 01/24/2019  . History of recurrent UTIs 12/03/2018  . Chronic pain of right knee 10/08/2018  . Type 2 diabetes  mellitus with diabetic neuropathy, unspecified ([place]) 08/12/2018  . Pulmonary vascular congestion   . Chronic diastolic CHF (congestive heart failure) ([place])   . Acute hypoxemic respiratory failure ([place]) 12/06/2017  . Permanent atrial fibrillation ([place]) 12/06/2017  . Trochanteric bursitis, right hip 07/16/2017  . Other intervertebral disc degeneration, lumbar region 07/16/2017  . Osteoporosis 11/04/2016  . Diabetic polyneuropathy associated with type 2 diabetes mellitus ([place]) 11/14/2014  . Fibromyalgia 02/11/2013  . CVA (cerebral vascular accident) ([place]) 08/01/2008  . Osteoarthritis 04/09/2007  . Hyperlipidemia associated with type 2 diabetes mellitus ([place]) 03/08/2007  . Hypertension associated with diabetes ([place]) 03/08/2007  . Pulmonary fibrosis ([place]) 03/08/2007  . GERD 03/08/2007  . BREAST CANCER, HX OF 03/08/2007    Past Surgical History:  Procedure Laterality Date  . APPENDECTOMY    . BREAST LUMPECTOMY Left   . CHOLECYSTECTOMY    . JOINT REPLACEMENT     bilat knee   . REPLACEMENT TOTAL KNEE BILATERAL Bilateral   . TONSILLECTOMY       OB History   No obstetric history on file.     Family History  Problem Relation Age of Onset  . Cancer Mother        lung  . Arthritis Mother   . Heart disease Father        endocarditis  . Cancer Brother        lung, throat  . Alcohol abuse Brother     Social History  Tobacco Use  . Smoking status: Never Smoker  . Smokeless tobacco: Never Used  Substance Use Topics  . Alcohol use: No    Alcohol/week: 0.0 standard drinks  . Drug use: No    Home Medications Prior to Admission medications   Medication Sig Start Date End Date Taking? Authorizing Provider  acetaminophen (TYLENOL) 500 MG tablet Take 1 tablet (500 mg total) by mouth every 6 (six) hours as needed for moderate pain. 07/16/19   Arrien, Jimmy Picket, MD  apixaban (ELIQUIS) 5 MG TABS tablet Take 1 tablet (5 mg total) by mouth 2 (two) times daily. 07/29/19   Baruch Gouty, FNP  atorvastatin (LIPITOR) 80 MG tablet Take 1 tablet (80 mg total) by mouth daily at 6 PM. Patient taking differently: Take 80 mg by mouth at bedtime.  07/01/19   Charlynne Cousins, MD  butalbital-aspirin-caffeine [facility]) [phone] MG capsule Take 1 capsule by mouth every 6 (six) hours as needed for headache.  07/13/19   [provider]  Cholecalciferol (VITAMIN D3) 50 MCG (2000 UT) TABS Take 2,000 Units by mouth in the morning.    [provider]  DULoxetine (CYMBALTA) 20 MG capsule Take 1 capsule (20 mg total) by mouth daily. 05/25/19 07/13/19  Baruch Gouty, FNP  glucose blood (ONE TOUCH ULTRA TEST) test strip USE TO check blood sugars twice daily 10/30/17   Claretta Fraise, MD  hydrALAZINE (APRESOLINE) 50 MG tablet Take 1 tablet (50 mg total) by mouth 2 (two) times daily. 06/23/19 07/23/19  Baruch Gouty, FNP  ibuprofen (ADVIL) 200 MG tablet Take 1 tablet (200 mg total) by mouth every 8 (eight) hours as needed (left arm pain). 07/16/19   Arrien, Jimmy Picket, MD  Insulin Pen Needle 31G X 8 MM MISC Use once daily with lantus solostar 10/30/17   Claretta Fraise, MD  LANTUS SOLOSTAR 100 UNIT/ML Solostar Pen INJECT 50 UNITS ONCE DAILY AT 10PM Patient taking differently: Inject 50 Units into the skin at bedtime.  06/18/19   Baruch Gouty, FNP  Lidocaine 4 % PTCH Apply 1 patch topically See admin instructions. Apply 1 patch to each hip daily for pain- on 12 hrs/off 12 hrs    [provider]  Magnesium 250 MG TABS Take 250 mg by mouth daily.    [provider]  metFORMIN (GLUCOPHAGE) 500 MG tablet TAKE (1) TABLET DAILY WITH BREAKFAST. Patient taking differently: Take 500 mg by mouth daily with breakfast.  03/18/19   Rakes, Connye Burkitt, FNP  omeprazole (PRILOSEC) 20 MG capsule TAKE  (1)  CAPSULE  TWICE DAILY (TAKE ON AN EMPTY STOMACH AT LEAST 30 MIN- UTES BEFORE MEALS). Patient taking differently: Take 20 mg by mouth 2 (two) times daily before a meal.  05/23/19    Rakes, Connye Burkitt, FNP  ondansetron (ZOFRAN) 4 MG tablet TAKE 1 TABLET EVERY 8 HOURS AS NEEDED FOR NAUSEA & VOMITING Patient taking differently: Take 4 mg by mouth every 8 (eight) hours as needed for nausea or vomiting.  06/20/19   Rakes, Connye Burkitt, FNP  potassium chloride (KLOR-CON) 10 MEQ tablet Take 1 tablet (10 mEq total) by mouth daily. Patient taking differently: Take 10 mEq by mouth in the morning.  06/20/19   Baruch Gouty, FNP  rOPINIRole (REQUIP) 0.25 MG tablet Take 1 tablet (0.25 mg total) by mouth 3 (three) times daily. Take one tablet in the morning, one tablet at noon and two tablets at bedtime. Patient taking differently: Take 0.25 mg by mouth  See admin instructions. Take 0.25 mg by mouth two times a day and 0.25 mg at bedtime 06/23/19 07/23/19  Baruch Gouty, FNP  SUPER B COMPLEX/C PO Take 1 tablet by mouth daily.    [provider]  Insulin Glargine (LANTUS SOLOSTAR) 100 UNIT/ML Solostar Pen INJECT 50 UNITS ONCE DAILY AT 10PM 11/01/18   Baruch Gouty, FNP    Allergies    Diltiazem hcl, Ace inhibitors, and Cymbalta [duloxetine hcl]  Review of Systems   Review of Systems  Constitutional: Negative for chills and fever.  HENT: Positive for nosebleeds. Negative for congestion, ear pain, sinus pain, sneezing and sore throat.   Eyes: Negative for pain and visual disturbance.  Respiratory: Negative for cough and shortness of breath.   Cardiovascular: Negative for chest pain, palpitations and syncope.  Gastrointestinal: Negative for abdominal pain and vomiting.  Genitourinary: Negative for dysuria and hematuria.  Musculoskeletal: Negative for arthralgias and back pain.  Skin: Negative for color change and rash.  Neurological: Negative for dizziness, seizures, syncope and headaches.  All other systems reviewed and are negative.   Physical Exam Updated Vital Signs BP (!) 143/73   Pulse 74   Temp 97.8 F (36.6 C) (Oral)   Resp 17   SpO2 96%    Physical Exam Vitals and  nursing note reviewed.  Constitutional:      General: She is not in acute distress.    Appearance: She is well-developed. She is not ill-appearing.  HENT:     Head: Normocephalic and atraumatic.     Nose: Nose normal. No congestion or rhinorrhea.     Comments: No obvious bleeding, no bleeding in oropharnx    Mouth/Throat:     Mouth: Mucous membranes are moist.  Eyes:     Extraocular Movements: Extraocular movements intact.     Conjunctiva/sclera: Conjunctivae normal.     Pupils: Pupils are equal, round, and reactive to light.  Cardiovascular:     Rate and Rhythm: Normal rate and regular rhythm.     Pulses: Normal pulses.     Heart sounds: No murmur.  Pulmonary:     Effort: Pulmonary effort is normal. No respiratory distress.     Breath sounds: Normal breath sounds.  Abdominal:     Palpations: Abdomen is soft.     Tenderness: There is no abdominal tenderness.  Musculoskeletal:     Cervical back: Neck supple.  Skin:    General: Skin is warm and dry.  Neurological:     Mental Status: She is alert.     ED Results / Procedures / Treatments   Labs (all labs ordered are listed, but only abnormal results are displayed) Labs Reviewed - No data to display  EKG None  Radiology No results found.  Procedures Procedures (including critical care time)  Medications Ordered in ED Medications  oxymetazoline (AFRIN) 0.05 % nasal spray 1 spray (has no administration in time range)    ED Course  I have reviewed the triage vital signs and the nursing notes.  Pertinent labs & imaging results that were available during my care of the patient were reviewed by me and considered in my medical decision making (see chart for details).    MDM Rules/Calculators/A&P                      [name] is a 86 year old female with history of A. fib on Eliquis who presents to the ED with nosebleed.  EMS gave Afrin and  patient held pressure and nosebleed has stopped.  Has been stopped for  about an hour.  Patient with normal vitals.  No fever.  No signs of active bleeding.  Educated about how to stop nosebleeds if they recur.  Had recent head bleed and admission several weeks ago and states that she has been doing well.  Discharged in good condition.  Given return precautions.  This chart was dictated using voice recognition software.  Despite best efforts to proofread,  errors can occur which can change the documentation meaning.    Final Clinical Impression(s) / ED Diagnoses Final diagnoses:  Epistaxis    Rx / DC Orders ED Discharge Orders    None       Lennice Sites, DO 08/02/19 [id number]

## 2019-08-02 NOTE — ED Notes (Signed)
Nose bleed has subsided at this time.

## 2019-08-07 DIAGNOSIS — Z7901 Long term (current) use of anticoagulants: Secondary | ICD-10-CM | POA: Diagnosis not present

## 2019-08-11 DIAGNOSIS — Z7901 Long term (current) use of anticoagulants: Secondary | ICD-10-CM | POA: Diagnosis not present

## 2019-08-11 DIAGNOSIS — R319 Hematuria, unspecified: Secondary | ICD-10-CM | POA: Diagnosis not present

## 2019-08-11 DIAGNOSIS — N390 Urinary tract infection, site not specified: Secondary | ICD-10-CM | POA: Diagnosis not present

## 2019-08-11 DIAGNOSIS — D519 Vitamin B12 deficiency anemia, unspecified: Secondary | ICD-10-CM | POA: Diagnosis not present

## 2019-08-11 DIAGNOSIS — R040 Epistaxis: Secondary | ICD-10-CM | POA: Diagnosis not present

## 2019-08-11 DIAGNOSIS — E559 Vitamin D deficiency, unspecified: Secondary | ICD-10-CM | POA: Diagnosis not present

## 2019-08-11 DIAGNOSIS — D649 Anemia, unspecified: Secondary | ICD-10-CM | POA: Diagnosis not present

## 2019-08-11 DIAGNOSIS — E039 Hypothyroidism, unspecified: Secondary | ICD-10-CM | POA: Diagnosis not present

## 2019-08-11 DIAGNOSIS — E785 Hyperlipidemia, unspecified: Secondary | ICD-10-CM | POA: Diagnosis not present

## 2019-08-11 DIAGNOSIS — E119 Type 2 diabetes mellitus without complications: Secondary | ICD-10-CM | POA: Diagnosis not present

## 2019-08-17 DIAGNOSIS — I10 Essential (primary) hypertension: Secondary | ICD-10-CM | POA: Diagnosis not present

## 2019-08-17 DIAGNOSIS — E119 Type 2 diabetes mellitus without complications: Secondary | ICD-10-CM | POA: Diagnosis not present

## 2019-08-17 DIAGNOSIS — G8929 Other chronic pain: Secondary | ICD-10-CM | POA: Diagnosis not present

## 2019-08-17 DIAGNOSIS — R040 Epistaxis: Secondary | ICD-10-CM | POA: Diagnosis not present

## 2019-08-23 NOTE — Progress Notes (Deleted)
NEUROLOGY CONSULTATION NOTE  [name] MRN: [id number] DOB: Jun 24, 1925  Referring provider: Rosalin Hawking, MD [facility] referral) Primary care provider: Darla Lesches, [place]  Reason for consult:  Stroke  HISTORY OF PRESENT ILLNESS: [name] is a 86 year old ***-handed white female with atrial fibrillation, type 2 diabetes mellitus, HTN, HLD and history of breast cancer who presents for stroke.  History supplemented by hospital records.  CT, CTA and MRI performed in hospital were personally reviewed.  She was admitted to [facility] on 06/26/2019 for 2 weeks of worsening confusion.  Neurologic exam was non-lateralizing.  CT head showed atrophy and chronic small vessel disease but no acute intracranial abnormality.  MRI of brain showed acute subcentimeter inferior left basal ganglia and medial thalamic infarcts.  CTA of head and neck showed diffuse intracranial atherosclerosis with severe left P2 stenosis, mild to moderate stenosis origin left MCA superior branch, right supraclinoid 40% stenosis, right vertebral artery origine 50% stenosis and mixed bilateral ICA bulb plaque without stenosis.  2D echo showed EF 60-65% with no embolus.  LDL was 179.  Hgb A1c was 6.3.  At time of admission, she was already on Eliquis for atrial fibrillation.  Due to source of stroke presumed to be secondary to small vessel disease, ASA 81mg  daily was added in addition to Eliquis for secondary stroke prevention.  Lipitor 80mg  daily was started.    PAST MEDICAL HISTORY: Past Medical History:  Diagnosis Date  . Arthritis   . Atrial fibrillation ([place])   . Cancer ([place])    breast  . Cataract   . Diabetes mellitus without complication ([place])   . Frequent UTI   . Hyperlipidemia   . Hypertension   . Neck pain   . Scoliosis   . Stroke [facility])     PAST SURGICAL HISTORY: Past Surgical History:  Procedure Laterality Date  . APPENDECTOMY    . BREAST LUMPECTOMY Left   . CHOLECYSTECTOMY    . JOINT  REPLACEMENT     bilat knee   . REPLACEMENT TOTAL KNEE BILATERAL Bilateral   . TONSILLECTOMY      MEDICATIONS: Current Outpatient Medications on File Prior to Visit  Medication Sig Dispense Refill  . acetaminophen (TYLENOL) 500 MG tablet Take 1 tablet (500 mg total) by mouth every 6 (six) hours as needed for moderate pain. 30 tablet 0  . apixaban (ELIQUIS) 5 MG TABS tablet Take 1 tablet (5 mg total) by mouth 2 (two) times daily. 180 tablet 0  . atorvastatin (LIPITOR) 80 MG tablet Take 1 tablet (80 mg total) by mouth daily at 6 PM. (Patient taking differently: Take 80 mg by mouth at bedtime. ) 30 tablet 3  . butalbital-aspirin-caffeine (FIORINAL) 50-325-40 MG capsule Take 1 capsule by mouth every 6 (six) hours as needed for headache.     . Cholecalciferol (VITAMIN D3) 50 MCG (2000 UT) TABS Take 2,000 Units by mouth in the morning.    . DULoxetine (CYMBALTA) 20 MG capsule Take 1 capsule (20 mg total) by mouth daily. 30 capsule 6  . glucose blood (ONE TOUCH ULTRA TEST) test strip USE TO check blood sugars twice daily 100 each 12  . hydrALAZINE (APRESOLINE) 50 MG tablet Take 1 tablet (50 mg total) by mouth 2 (two) times daily. 60 tablet 3  . ibuprofen (ADVIL) 200 MG tablet Take 1 tablet (200 mg total) by mouth every 8 (eight) hours as needed (left arm pain). 20 tablet 0  . Insulin Pen Needle 31G  X 8 MM MISC Use once daily with lantus solostar 100 each 6  . LANTUS SOLOSTAR 100 UNIT/ML Solostar Pen INJECT 50 UNITS ONCE DAILY AT 10PM (Patient taking differently: Inject 50 Units into the skin at bedtime. ) 15 mL 0  . Lidocaine 4 % PTCH Apply 1 patch topically See admin instructions. Apply 1 patch to each hip daily for pain- on 12 hrs/off 12 hrs    . Magnesium 250 MG TABS Take 250 mg by mouth daily.    . metFORMIN (GLUCOPHAGE) 500 MG tablet TAKE (1) TABLET DAILY WITH BREAKFAST. (Patient taking differently: Take 500 mg by mouth daily with breakfast. ) 90 tablet 1  . omeprazole (PRILOSEC) 20 MG capsule  TAKE  (1)  CAPSULE  TWICE DAILY (TAKE ON AN EMPTY STOMACH AT LEAST 30 MIN- UTES BEFORE MEALS). (Patient taking differently: Take 20 mg by mouth 2 (two) times daily before a meal. ) 180 capsule 0  . ondansetron (ZOFRAN) 4 MG tablet TAKE 1 TABLET EVERY 8 HOURS AS NEEDED FOR NAUSEA & VOMITING (Patient taking differently: Take 4 mg by mouth every 8 (eight) hours as needed for nausea or vomiting. ) 20 tablet 0  . potassium chloride (KLOR-CON) 10 MEQ tablet Take 1 tablet (10 mEq total) by mouth daily. (Patient taking differently: Take 10 mEq by mouth in the morning. ) 30 tablet 0  . rOPINIRole (REQUIP) 0.25 MG tablet Take 1 tablet (0.25 mg total) by mouth 3 (three) times daily. Take one tablet in the morning, one tablet at noon and two tablets at bedtime. (Patient taking differently: Take 0.25 mg by mouth See admin instructions. Take 0.25 mg by mouth two times a day and 0.25 mg at bedtime) 120 tablet 3  . SUPER B COMPLEX/C PO Take 1 tablet by mouth daily.    . [DISCONTINUED] Insulin Glargine (LANTUS SOLOSTAR) 100 UNIT/ML Solostar Pen INJECT 50 UNITS ONCE DAILY AT 10PM 15 mL 1   No current facility-administered medications on file prior to visit.    ALLERGIES: Allergies  Allergen Reactions  . Diltiazem Hcl Itching and Other (See Comments)    Red itchy rash started a few hours after taking short acting 120mg  dilt tabs  . Ace Inhibitors Cough  . Cymbalta [Duloxetine Hcl] Anxiety and Other (See Comments)    Makes patient feel "restless and scared" and unable to sleep    FAMILY HISTORY: Family History  Problem Relation Age of Onset  . Cancer Mother        lung  . Arthritis Mother   . Heart disease Father        endocarditis  . Cancer Brother        lung, throat  . Alcohol abuse Brother    ***.  SOCIAL HISTORY: Social History   Socioeconomic History  . Marital status: Widowed    Spouse name: Not on file  . Number of children: 2  . Years of education: 23  . Highest education level: Some  college, no degree  Occupational History  . Occupation: Retired    Fish farm manager: [employer]    Comment: Network engineer  Tobacco Use  . Smoking status: Never Smoker  . Smokeless tobacco: Never Used  Substance and Sexual Activity  . Alcohol use: No    Alcohol/week: 0.0 standard drinks  . Drug use: No  . Sexual activity: Not Currently  Other Topics Concern  . Not on file  Social History Narrative   Lives alone in a one story home.  Husband passed away.  She has two sons 2 sons but.  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  Education: specialty courses with [employer], high school education.   Social Determinants of Health   Financial Resource Strain:   . Difficulty of Paying Living Expenses:   Food Insecurity:   . Worried About Charity fundraiser in the Last Year:   . Arboriculturist in the Last Year:   Transportation Needs:   . Film/video editor (Medical):   Marland Kitchen Lack of Transportation (Non-Medical):   Physical Activity:   . Days of Exercise per Week:   . Minutes of Exercise per Session:   Stress:   . Feeling of Stress :   Social Connections:   . Frequency of Communication with Friends and Family:   . Frequency of Social Gatherings with Friends and Family:   . Attends Religious Services:   . Active Member of Clubs or Organizations:   . Attends Archivist Meetings:   Marland Kitchen Marital Status:   Intimate Partner Violence:   . Fear of Current or Ex-Partner:   . Emotionally Abused:   Marland Kitchen Physically Abused:   . Sexually Abused:     REVIEW OF SYSTEMS: Constitutional: No fevers, chills, or sweats, no generalized fatigue, change in appetite Eyes: No visual changes, double vision, eye pain Ear, nose and throat: No hearing loss, ear pain, nasal congestion, sore throat Cardiovascular: No chest pain, palpitations Respiratory:  No shortness of breath at rest or with exertion, wheezes GastrointestinaI: No nausea, vomiting, diarrhea, abdominal pain, fecal incontinence Genitourinary:  No  dysuria, urinary retention or frequency Musculoskeletal:  No neck pain, back pain Integumentary: No rash, pruritus, skin lesions Neurological: as above Psychiatric: No depression, insomnia, anxiety Endocrine: No palpitations, fatigue, diaphoresis, mood swings, change in appetite, change in weight, increased thirst Hematologic/Lymphatic:  No purpura, petechiae. Allergic/Immunologic: no itchy/runny eyes, nasal congestion, recent allergic reactions, rashes  PHYSICAL EXAM: *** General: No acute distress.  Patient appears ***-groomed.  *** Head:  Normocephalic/atraumatic Eyes:  fundi examined but not visualized Neck: supple, no paraspinal tenderness, full range of motion Back: No paraspinal tenderness Heart: regular rate and rhythm Lungs: Clear to auscultation bilaterally. Vascular: No carotid bruits. Neurological Exam: Mental status: alert and oriented to person, place, and time, recent and remote memory intact, fund of knowledge intact, attention and concentration intact, speech fluent and not dysarthric, language intact. Cranial nerves: CN I: not tested CN II: pupils equal, round and reactive to light, visual fields intact CN III, IV, VI:  full range of motion, no nystagmus, no ptosis CN V: facial sensation intact CN VII: upper and lower face symmetric CN VIII: hearing intact CN IX, X: gag intact, uvula midline CN XI: sternocleidomastoid and trapezius muscles intact CN XII: tongue midline Bulk & Tone: normal, no fasciculations. Motor:  5/5 throughout *** Sensation:  Pinprick *** temperature *** and vibration sensation intact.  ***. Deep Tendon Reflexes:  2+ throughout, *** toes downgoing.  *** Finger to nose testing:  Without dysmetria.  *** Heel to shin:  Without dysmetria.  *** Gait:  Normal station and stride.  Able to turn and tandem walk. [place] ***.  IMPRESSION: ***  PLAN: ***  Thank you for allowing me to take part in the care of this patient.  Metta Clines,  DO  CC: ***

## 2019-08-25 ENCOUNTER — Ambulatory Visit: Payer: PPO | Admitting: Neurology

## 2019-08-29 ENCOUNTER — Ambulatory Visit: Payer: Self-pay | Admitting: Licensed Clinical Social Worker

## 2019-08-29 DIAGNOSIS — K219 Gastro-esophageal reflux disease without esophagitis: Secondary | ICD-10-CM

## 2019-08-29 DIAGNOSIS — E1169 Type 2 diabetes mellitus with other specified complication: Secondary | ICD-10-CM

## 2019-08-29 DIAGNOSIS — M159 Polyosteoarthritis, unspecified: Secondary | ICD-10-CM

## 2019-08-29 DIAGNOSIS — M797 Fibromyalgia: Secondary | ICD-10-CM

## 2019-08-29 DIAGNOSIS — E785 Hyperlipidemia, unspecified: Secondary | ICD-10-CM

## 2019-08-29 DIAGNOSIS — E1140 Type 2 diabetes mellitus with diabetic neuropathy, unspecified: Secondary | ICD-10-CM

## 2019-08-29 DIAGNOSIS — E1159 Type 2 diabetes mellitus with other circulatory complications: Secondary | ICD-10-CM

## 2019-08-29 DIAGNOSIS — M6281 Muscle weakness (generalized): Secondary | ICD-10-CM | POA: Diagnosis not present

## 2019-08-29 DIAGNOSIS — R2689 Other abnormalities of gait and mobility: Secondary | ICD-10-CM | POA: Diagnosis not present

## 2019-08-29 NOTE — Chronic Care Management (AMB) (Signed)
Chronic Care Management    Clinical Social Work Follow Up Note  08/29/2019 Name: [name] MRN: [id number] DOB: 01-18-1926  [name] is a 86 y.o. year old female who is a primary care patient of [name],[name]. The CCM team was consulted for assistance with Community resources.   Review of patient status, including review of consultants reports, other relevant assessments, and collaboration with appropriate care team members and the patient's provider was performed as part of comprehensive patient evaluation and provision of chronic care management services.    SDOH (Social Determinants of Health) assessments performed: Yes;risk for tobacco use; risk for depression     Office Visit from 05/25/2019 in [place]  PHQ-9 Total Score  13      Outpatient Encounter Medications as of 08/29/2019  Medication Sig Note  . acetaminophen (TYLENOL) 500 MG tablet Take 1 tablet (500 mg total) by mouth every 6 (six) hours as needed for moderate pain.   Marland Kitchen apixaban (ELIQUIS) 5 MG TABS tablet Take 1 tablet (5 mg total) by mouth 2 (two) times daily.   Marland Kitchen atorvastatin (LIPITOR) 80 MG tablet Take 1 tablet (80 mg total) by mouth daily at 6 PM. (Patient taking differently: Take 80 mg by mouth at bedtime. )   . butalbital-aspirin-caffeine (FIORINAL) 50-325-40 MG capsule Take 1 capsule by mouth every 6 (six) hours as needed for headache.    . Cholecalciferol (VITAMIN D3) 50 MCG (2000 UT) TABS Take 2,000 Units by mouth in the morning.   . DULoxetine (CYMBALTA) 20 MG capsule Take 1 capsule (20 mg total) by mouth daily. 07/13/2019: Makes patient feel "restless and scared" and unable to sleep  . glucose blood (ONE TOUCH ULTRA TEST) test strip USE TO check blood sugars twice daily   . hydrALAZINE (APRESOLINE) 50 MG tablet Take 1 tablet (50 mg total) by mouth 2 (two) times daily.   Marland Kitchen ibuprofen (ADVIL) 200 MG tablet Take 1 tablet (200 mg total) by mouth every 8 (eight) hours as needed  (left arm pain).   . Insulin Pen Needle 31G X 8 MM MISC Use once daily with lantus solostar   . LANTUS SOLOSTAR 100 UNIT/ML Solostar Pen INJECT 50 UNITS ONCE DAILY AT 10PM (Patient taking differently: Inject 50 Units into the skin at bedtime. )   . Lidocaine 4 % PTCH Apply 1 patch topically See admin instructions. Apply 1 patch to each hip daily for pain- on 12 hrs/off 12 hrs   . Magnesium 250 MG TABS Take 250 mg by mouth daily.   . metFORMIN (GLUCOPHAGE) 500 MG tablet TAKE (1) TABLET DAILY WITH BREAKFAST. (Patient taking differently: Take 500 mg by mouth daily with breakfast. )   . omeprazole (PRILOSEC) 20 MG capsule TAKE  (1)  CAPSULE  TWICE DAILY (TAKE ON AN EMPTY STOMACH AT LEAST 30 MIN- UTES BEFORE MEALS). (Patient taking differently: Take 20 mg by mouth 2 (two) times daily before a meal. )   . ondansetron (ZOFRAN) 4 MG tablet TAKE 1 TABLET EVERY 8 HOURS AS NEEDED FOR NAUSEA & VOMITING (Patient taking differently: Take 4 mg by mouth every 8 (eight) hours as needed for nausea or vomiting. )   . potassium chloride (KLOR-CON) 10 MEQ tablet Take 1 tablet (10 mEq total) by mouth daily. (Patient taking differently: Take 10 mEq by mouth in the morning. )   . rOPINIRole (REQUIP) 0.25 MG tablet Take 1 tablet (0.25 mg total) by mouth 3 (three) times daily. Take one tablet in the  morning, one tablet at noon and two tablets at bedtime. (Patient taking differently: Take 0.25 mg by mouth See admin instructions. Take 0.25 mg by mouth two times a day and 0.25 mg at bedtime)   . SUPER B COMPLEX/C PO Take 1 tablet by mouth daily.   . [DISCONTINUED] Insulin Glargine (LANTUS SOLOSTAR) 100 UNIT/ML Solostar Pen INJECT 50 UNITS ONCE DAILY AT 10PM    No facility-administered encounter medications on file as of 08/29/2019.    [name] called phone number for [name], son of client, today. [name] was not able to speak with [name] via phone today. However, [name] did leave phone message for [name]  requesting he please return call to [name] at [phone] to discuss current needs of client  Follow Up Plan:[name] to call client/[name], son,in next 4 weeks to assess the psychosocial needs of client at that time  Norva Riffle.Raynald Rouillard MSW, [name] Licensed Clinical Social Worker [facility] Care Management [phone]

## 2019-08-29 NOTE — Patient Instructions (Addendum)
Licensed Clinical Social Worker Visit Information  Materials Provided:  No  [name] is a 86 y.o. year old female who is a primary care patient of [place]. The CCM team was consulted for assistance with Community resources.   Review of patient status, including review of consultants reports, other relevant assessments, and collaboration with appropriate care team members and the patient's provider was performed as part of comprehensive patient evaluation and provision of chronic care management services.    SDOH (Social Determinants of Health) assessments performed: Yes;risk for tobacco use; risk for depression    [name] called phone number for [name], son of client, today. [name] was not able to speak with [name] via phone today. However, [name] did leave phone message for [name] requesting he please return call to [name] at [phone] to discuss current needs of client  Follow Up Plan:[name] to call client/[name], son,in next 4 weeks to assess the psychosocial needs of client at that time  [name] was not able to speak with [name], son of client today. Thus client or [name] were not able to verbalize understanding of instructions provided today and client or [name] were not able to accept or decline a print copy of patient instruction materials.   [name].[name] MSW, [name] Licensed Clinical Social Worker [facility] Care Management [phone]

## 2019-09-01 DIAGNOSIS — E1142 Type 2 diabetes mellitus with diabetic polyneuropathy: Secondary | ICD-10-CM | POA: Diagnosis not present

## 2019-09-01 DIAGNOSIS — I10 Essential (primary) hypertension: Secondary | ICD-10-CM | POA: Diagnosis not present

## 2019-09-01 DIAGNOSIS — R040 Epistaxis: Secondary | ICD-10-CM | POA: Diagnosis not present

## 2019-09-01 DIAGNOSIS — G8929 Other chronic pain: Secondary | ICD-10-CM | POA: Diagnosis not present

## 2019-09-04 DIAGNOSIS — R2689 Other abnormalities of gait and mobility: Secondary | ICD-10-CM | POA: Diagnosis not present

## 2019-09-04 DIAGNOSIS — M6281 Muscle weakness (generalized): Secondary | ICD-10-CM | POA: Diagnosis not present

## 2019-09-07 DIAGNOSIS — I10 Essential (primary) hypertension: Secondary | ICD-10-CM | POA: Diagnosis not present

## 2019-09-07 DIAGNOSIS — K219 Gastro-esophageal reflux disease without esophagitis: Secondary | ICD-10-CM | POA: Diagnosis not present

## 2019-09-07 DIAGNOSIS — I4891 Unspecified atrial fibrillation: Secondary | ICD-10-CM | POA: Diagnosis not present

## 2019-09-07 DIAGNOSIS — R040 Epistaxis: Secondary | ICD-10-CM | POA: Diagnosis not present

## 2019-09-08 DIAGNOSIS — I10 Essential (primary) hypertension: Secondary | ICD-10-CM | POA: Diagnosis not present

## 2019-09-08 NOTE — Progress Notes (Signed)
Cardiology Office Note   Date:  09/12/2019   ID:  [name], DOB [birth year], MRN [id number]  PCP:  Baruch Gouty, FNP  Cardiologist:  DrMarland Kitchen [place]  No chief complaint on file.    History of Present Illness: [name] is a 86 y.o. female who presents for ongoing assessment and management of atrial fibrillation on chronic Eliquis and hypertension.  Other history includes hyperlipidemia, prior CVA (right pontine bowel make in 2007) and type 2 diabetes.  She was also noted to have moderate carotid arterial disease with 40 to 59% on the right and less than 39% on the left.  Dr. [place] noted that she was admitted for acute respiratory failure in August 2019 felt to be multifactorial in the setting of acute diastolic CHF and pulmonary fibrosis.  She was found to be allergic to diltiazem causing a rash and this was discontinued.  Last seen by Dr. [place] on 12/29/2018 and was stable from a cardiovascular standpoint with the exception of elevated blood pressure.  She was continued on hydralazine but increased to 100 mg twice daily and also to take an extra 50 mg as needed for blood pressure systolic greater than [id number].  Her heart rate was well controlled and she was to be continued on Eliquis.  [name] has been following a minimum of twice a week.  She states that she is tripping over her walker, her family says that she sometimes is unsteady on her feet and uses the furniture to help her to ambulate.  She also has been having some nosebleeds.  She says that she sometimes has some dizziness.  I am seeing her to make a decision concerning continuing Eliquis.    Past Medical History:  Diagnosis Date  . Arthritis   . Atrial fibrillation ([place])   . Cancer ([place])    breast  . Cataract   . Diabetes mellitus without complication ([place])   . Frequent UTI   . Hyperlipidemia   . Hypertension   . Neck pain   . Scoliosis   . Stroke [facility])     Past Surgical History:  Procedure Laterality  Date  . APPENDECTOMY    . BREAST LUMPECTOMY Left   . CHOLECYSTECTOMY    . JOINT REPLACEMENT     bilat knee   . REPLACEMENT TOTAL KNEE BILATERAL Bilateral   . TONSILLECTOMY       Current Outpatient Medications  Medication Sig Dispense Refill  . acetaminophen (TYLENOL) 500 MG tablet Take 1 tablet (500 mg total) by mouth every 6 (six) hours as needed for moderate pain. 30 tablet 0  . apixaban (ELIQUIS) 5 MG TABS tablet Take 1 tablet (5 mg total) by mouth 2 (two) times daily. 180 tablet 0  . atorvastatin (LIPITOR) 80 MG tablet Take 1 tablet (80 mg total) by mouth daily at 6 PM. (Patient taking differently: Take 80 mg by mouth at bedtime. ) 30 tablet 3  . butalbital-aspirin-caffeine (FIORINAL) 50-325-40 MG capsule Take 1 capsule by mouth every 6 (six) hours as needed for headache.     . Cholecalciferol (VITAMIN D3) 50 MCG (2000 UT) TABS Take 2,000 Units by mouth in the morning.    Marland Kitchen glucose blood (ONE TOUCH ULTRA TEST) test strip USE TO check blood sugars twice daily 100 each 12  . ibuprofen (ADVIL) 200 MG tablet Take 1 tablet (200 mg total) by mouth every 8 (eight) hours as needed (left arm pain). 20 tablet 0  . Insulin Pen Needle 31G  X 8 MM MISC Use once daily with lantus solostar 100 each 6  . LANTUS SOLOSTAR 100 UNIT/ML Solostar Pen INJECT 50 UNITS ONCE DAILY AT 10PM (Patient taking differently: Inject 50 Units into the skin at bedtime. ) 15 mL 0  . Lidocaine 4 % PTCH Apply 1 patch topically See admin instructions. Apply 1 patch to each hip daily for pain- on 12 hrs/off 12 hrs    . Magnesium 250 MG TABS Take 250 mg by mouth daily.    . metFORMIN (GLUCOPHAGE) 500 MG tablet TAKE (1) TABLET DAILY WITH BREAKFAST. (Patient taking differently: Take 500 mg by mouth daily with breakfast. ) 90 tablet 1  . omeprazole (PRILOSEC) 20 MG capsule TAKE  (1)  CAPSULE  TWICE DAILY (TAKE ON AN EMPTY STOMACH AT LEAST 30 MIN- UTES BEFORE MEALS). (Patient taking differently: Take 20 mg by mouth 2 (two) times daily  before a meal. ) 180 capsule 0  . ondansetron (ZOFRAN) 4 MG tablet TAKE 1 TABLET EVERY 8 HOURS AS NEEDED FOR NAUSEA & VOMITING (Patient taking differently: Take 4 mg by mouth every 8 (eight) hours as needed for nausea or vomiting. ) 20 tablet 0  . potassium chloride (KLOR-CON) 10 MEQ tablet Take 1 tablet (10 mEq total) by mouth daily. (Patient taking differently: Take 10 mEq by mouth in the morning. ) 30 tablet 0  . SUPER B COMPLEX/C PO Take 1 tablet by mouth daily.    . DULoxetine (CYMBALTA) 20 MG capsule Take 1 capsule (20 mg total) by mouth daily. 30 capsule 6  . hydrALAZINE (APRESOLINE) 50 MG tablet Take 1 tablet (50 mg total) by mouth 2 (two) times daily. 60 tablet 3  . rOPINIRole (REQUIP) 0.25 MG tablet Take 1 tablet (0.25 mg total) by mouth 3 (three) times daily. Take one tablet in the morning, one tablet at noon and two tablets at bedtime. (Patient taking differently: Take 0.25 mg by mouth See admin instructions. Take 0.25 mg by mouth two times a day and 0.25 mg at bedtime) 120 tablet 3   No current facility-administered medications for this visit.    Allergies:   Diltiazem hcl, Ace inhibitors, and Cymbalta [duloxetine hcl]    Social History:  The patient  reports that she has never smoked. She has never used smokeless tobacco. She reports that she does not drink alcohol or use drugs.   Family History:  The patient's family history includes Alcohol abuse in her brother; Arthritis in her mother; Cancer in her brother and mother; Heart disease in her father.    ROS: All other systems are reviewed and negative. Unless otherwise mentioned in H&P    PHYSICAL EXAM: VS:  BP 112/63   Pulse 73   Ht 4\' 11"  (1.499 m)   Wt 135 lb (61.2 kg)   SpO2 95%   BMI 27.27 kg/m  , BMI Body mass index is 27.27 kg/m. GEN: Well nourished, well developed, in no acute distress, sitting in a wheelchair HEENT: normal Neck: no JVD, carotid bruits, or masses Cardiac: IRRR; no murmurs, rubs, or gallops,no  edema  Respiratory:  Clear to auscultation bilaterally, normal work of breathing GI: soft, nontender, nondistended, + BS MS: no deformity or atrophy, complaining of left upper femur pain.  Not reproducible with palpation. Skin: warm and dry, no rash Neuro:  Strength and sensation are intact Psych: euthymic mood, full affect   EKG: Not completed this office visit  Recent Labs: 06/26/2019: TSH 0.683 06/29/2019: Magnesium 2.0 07/13/2019: ALT 24 07/16/2019:  BUN 6; Creatinine, Ser 0.55; Hemoglobin 13.1; Platelets 194; Potassium 3.2; Sodium 140    Lipid Panel    Component Value Date/Time   CHOL 240 (H) 06/27/2019 0148   CHOL 255 (H) 06/22/2019 1300   CHOL 176 11/09/2012 1604   TRIG 88 06/27/2019 0148   TRIG 189 (H) 07/14/2013 1525   TRIG 242 (H) 11/09/2012 1604   HDL 43 06/27/2019 0148   HDL 42 06/22/2019 1300   HDL 52 07/14/2013 1525   HDL 44 11/09/2012 1604   CHOLHDL 5.6 06/27/2019 0148   VLDL 18 06/27/2019 0148   LDLCALC 179 (H) 06/27/2019 0148   LDLCALC 194 (H) 06/22/2019 1300   LDLCALC 102 (H) 07/14/2013 1525   LDLCALC 84 11/09/2012 1604      Wt Readings from Last 3 Encounters:  09/12/19 135 lb (61.2 kg)  07/13/19 153 lb (69.4 kg)  06/26/19 153 lb (69.4 kg)      Other studies Reviewed:    ASSESSMENT AND PLAN:  1.  Hypertension: She is currently on hydralazine 50 mg twice daily.  I am not certain if she is orthostatic or not.  She is in a wheelchair today and I did not want to risk having her go through orthostatic blood pressure checks at this time.  I am going to check a CBC and a BMET to evaluate for dehydration and anemia causing her to feel dizzy.  If these are normal, would recommend decreasing hydralazine to 25 mg twice daily as her blood pressure is soft today.  She may tolerate higher blood pressure with less falls as a result.  2.  Atrial fibrillation: Heart rate is well controlled currently.  I will discontinue Eliquis and start her on 325 mg aspirin daily.   Checking CBC.  With her frequent falls this is concerning.  I have discussed risks and benefits with her and her son and with shared decision making we have decided to discontinue anticoagulation therapy with exception of aspirin daily.  3.  Non-insulin-dependent diabetes: Has been followed by PCP but she no longer has one.  I will refer her to a geriatric PCP as she will need more specialized evaluation.  This will be referred through [facility].  She is in favor of establishing with a new primary care.   Current medicines are reviewed at length with the patient today.  I have spent 25 minutes dedicated to the care of this patient on the date of this encounter to include pre-visit review of records, assessment, management and diagnostic testing,with shared decision making.  Labs/ tests ordered today include: CBC, BM ET  Phill Myron. West Pugh, ANP, AACC   09/12/2019 4:38 PM    [facility] [place] [address] Office [phone] Fax [phone]  Notice: This dictation was prepared with Dragon dictation along with smaller phrase technology. Any transcriptional errors that result from this process are unintentional and may not be corrected upon review.

## 2019-09-12 ENCOUNTER — Other Ambulatory Visit: Payer: Self-pay

## 2019-09-12 ENCOUNTER — Ambulatory Visit: Payer: PPO | Admitting: Adult Health

## 2019-09-12 ENCOUNTER — Encounter: Payer: Self-pay | Admitting: Adult Health

## 2019-09-12 VITALS — BP 112/63 | HR 73 | Ht 59.0 in | Wt 135.0 lb

## 2019-09-12 DIAGNOSIS — I4811 Longstanding persistent atrial fibrillation: Secondary | ICD-10-CM

## 2019-09-12 DIAGNOSIS — R296 Repeated falls: Secondary | ICD-10-CM | POA: Diagnosis not present

## 2019-09-12 DIAGNOSIS — E1169 Type 2 diabetes mellitus with other specified complication: Secondary | ICD-10-CM

## 2019-09-12 DIAGNOSIS — I10 Essential (primary) hypertension: Secondary | ICD-10-CM

## 2019-09-12 DIAGNOSIS — Z79899 Other long term (current) drug therapy: Secondary | ICD-10-CM

## 2019-09-12 DIAGNOSIS — E785 Hyperlipidemia, unspecified: Secondary | ICD-10-CM | POA: Diagnosis not present

## 2019-09-12 NOTE — Patient Instructions (Addendum)
Medication Instructions:  STOP- Eliquis START- Aspirin 325 mg by mouth daily  *If you need a refill on your cardiac medications before your next appointment, please call your pharmacy*   Lab Work: BMP and CBC Today  If you have labs (blood work) drawn today and your tests are completely normal, you will receive your results only by: Marland Kitchen MyChart Message (if you have MyChart) OR . A paper copy in the mail If you have any lab test that is abnormal or we need to change your treatment, we will call you to review the results.   Testing/Procedures: None Ordered   Follow-Up: At [facility], you and your health needs are our priority.  As part of our continuing mission to provide you with exceptional heart care, we have created designated Provider Care Teams.  These Care Teams include your primary Cardiologist (physician) and Advanced Practice Providers (APPs -  Physician Assistants and Nurse Practitioners) who all work together to provide you with the care you need, when you need it.  We recommend signing up for the patient portal called "MyChart".  Sign up information is provided on this After Visit Summary.  MyChart is used to connect with patients for Virtual Visits (Telemedicine).  Patients are able to view lab/test results, encounter notes, upcoming appointments, etc.  Non-urgent messages can be sent to your provider as well.   To learn more about what you can do with MyChart, go to [email or web address].    Your next appointment:   6 month(s)  The format for your next appointment:   In Person  Provider:   You may see Peter Martinique, MD or one of the following Advanced Practice Providers on your designated Care Team:    Almyra Deforest, PA-C  Fabian Sharp, PA-C or   Roby Lofts, [place]

## 2019-09-13 DIAGNOSIS — D649 Anemia, unspecified: Secondary | ICD-10-CM | POA: Diagnosis not present

## 2019-09-13 DIAGNOSIS — Z7901 Long term (current) use of anticoagulants: Secondary | ICD-10-CM | POA: Diagnosis not present

## 2019-09-13 DIAGNOSIS — R040 Epistaxis: Secondary | ICD-10-CM | POA: Diagnosis not present

## 2019-09-13 LAB — BASIC METABOLIC PANEL
BUN/Creatinine Ratio: 25 (ref 12–28)
BUN: 22 mg/dL (ref 10–36)
CO2: 24 mmol/L (ref 20–29)
Calcium: 9.2 mg/dL (ref 8.7–10.3)
Chloride: 96 mmol/L (ref 96–106)
Creatinine, Ser: 0.88 mg/dL (ref 0.57–1.00)
GFR calc Af Amer: 66 mL/min/{1.73_m2} (ref >59)
GFR calc non Af Amer: 58 mL/min/{1.73_m2} — ABNORMAL LOW (ref >59)
Glucose: 235 mg/dL — ABNORMAL HIGH (ref 65–99)
Potassium: 3.9 mmol/L (ref 3.5–5.2)
Sodium: 138 mmol/L (ref 134–144)

## 2019-09-13 LAB — CBC
Hematocrit: 38.4 % (ref 34.0–46.6)
Hemoglobin: 11.3 g/dL (ref 11.1–15.9)
MCH: 24.4 pg — ABNORMAL LOW (ref 26.6–33.0)
MCHC: 29.4 g/dL — ABNORMAL LOW (ref 31.5–35.7)
MCV: 83 fL (ref 79–97)
Platelets: 282 10*3/uL (ref 150–450)
RBC: 4.64 x10E6/uL (ref 3.77–5.28)
RDW: 14.5 % (ref 11.7–15.4)
WBC: 7.4 10*3/uL (ref 3.4–10.8)

## 2019-09-14 ENCOUNTER — Telehealth: Payer: Self-pay | Admitting: *Deleted

## 2019-09-14 DIAGNOSIS — R601 Generalized edema: Secondary | ICD-10-CM | POA: Diagnosis not present

## 2019-09-14 DIAGNOSIS — R600 Localized edema: Secondary | ICD-10-CM | POA: Diagnosis not present

## 2019-09-14 DIAGNOSIS — R040 Epistaxis: Secondary | ICD-10-CM | POA: Diagnosis not present

## 2019-09-14 DIAGNOSIS — I10 Essential (primary) hypertension: Secondary | ICD-10-CM | POA: Diagnosis not present

## 2019-09-14 DIAGNOSIS — I4891 Unspecified atrial fibrillation: Secondary | ICD-10-CM | POA: Diagnosis not present

## 2019-09-14 NOTE — Telephone Encounter (Signed)
-----   Message from Lendon Colonel, NP sent at 09/13/2019  7:46 AM EDT ----- Blood sugar is elevated. She is to have been referred to new PCP for establishment. Please decrease the hydralazine to 25 mg BID instead of 50 mg BID to allow her to have a little higher BP to avoid falls. I want to see her a little sooner in. Please make appointment in one month with me  to check her BP.

## 2019-09-14 NOTE — Telephone Encounter (Signed)
Spoke with the nurse Georgina P[facility] she stated that pt saw Irene Limbo NP today at the facility, pt BP have been ranging in the 140s-180s. She also stated pt Hydralazine was change from 50 mg three times a day to four times a day, and she also take HCTz 50 mg daily which was not listed on pt medications list, pt was placed on a BP monitor for the next 5 days.

## 2019-09-14 NOTE — Telephone Encounter (Signed)
Wow. This is much different from when we reviewed her medications in the office this week. Will see her on follow up. Should stay on medications if BP is that elevated. Hydralazine should be TID. HCTZ should be 25 mg daily. Needs to bring her medications with her. Also BP recording results.   KL

## 2019-09-15 DIAGNOSIS — N390 Urinary tract infection, site not specified: Secondary | ICD-10-CM | POA: Diagnosis not present

## 2019-09-15 DIAGNOSIS — D649 Anemia, unspecified: Secondary | ICD-10-CM | POA: Diagnosis not present

## 2019-09-15 DIAGNOSIS — R040 Epistaxis: Secondary | ICD-10-CM | POA: Diagnosis not present

## 2019-09-15 DIAGNOSIS — Z7901 Long term (current) use of anticoagulants: Secondary | ICD-10-CM | POA: Diagnosis not present

## 2019-09-19 DIAGNOSIS — R42 Dizziness and giddiness: Secondary | ICD-10-CM | POA: Diagnosis not present

## 2019-09-19 DIAGNOSIS — R040 Epistaxis: Secondary | ICD-10-CM | POA: Diagnosis not present

## 2019-09-19 DIAGNOSIS — I10 Essential (primary) hypertension: Secondary | ICD-10-CM | POA: Diagnosis not present

## 2019-09-19 DIAGNOSIS — I4891 Unspecified atrial fibrillation: Secondary | ICD-10-CM | POA: Diagnosis not present

## 2019-09-20 ENCOUNTER — Encounter: Payer: Self-pay | Admitting: Adult Health

## 2019-09-20 DIAGNOSIS — Z79899 Other long term (current) drug therapy: Secondary | ICD-10-CM | POA: Diagnosis not present

## 2019-09-20 DIAGNOSIS — D649 Anemia, unspecified: Secondary | ICD-10-CM | POA: Diagnosis not present

## 2019-09-20 DIAGNOSIS — I482 Chronic atrial fibrillation, unspecified: Secondary | ICD-10-CM | POA: Diagnosis not present

## 2019-09-23 DIAGNOSIS — N390 Urinary tract infection, site not specified: Secondary | ICD-10-CM | POA: Diagnosis not present

## 2019-09-23 DIAGNOSIS — R040 Epistaxis: Secondary | ICD-10-CM | POA: Diagnosis not present

## 2019-09-23 DIAGNOSIS — I482 Chronic atrial fibrillation, unspecified: Secondary | ICD-10-CM | POA: Diagnosis not present

## 2019-09-23 DIAGNOSIS — Z7901 Long term (current) use of anticoagulants: Secondary | ICD-10-CM | POA: Diagnosis not present

## 2019-09-29 DIAGNOSIS — I4891 Unspecified atrial fibrillation: Secondary | ICD-10-CM | POA: Diagnosis not present

## 2019-09-29 DIAGNOSIS — R040 Epistaxis: Secondary | ICD-10-CM | POA: Diagnosis not present

## 2019-09-29 DIAGNOSIS — D649 Anemia, unspecified: Secondary | ICD-10-CM | POA: Diagnosis not present

## 2019-09-29 NOTE — Telephone Encounter (Signed)
Spoke with [name] (med Tech) about [name] recommendations and medication changes, she stated she will let pt doctor know about the changes, I also asked if she can faxed a copy of her monitor result when information in back

## 2019-09-30 ENCOUNTER — Telehealth: Payer: Self-pay

## 2019-09-30 DIAGNOSIS — L84 Corns and callosities: Secondary | ICD-10-CM | POA: Diagnosis not present

## 2019-09-30 DIAGNOSIS — E1142 Type 2 diabetes mellitus with diabetic polyneuropathy: Secondary | ICD-10-CM | POA: Diagnosis not present

## 2019-09-30 DIAGNOSIS — I10 Essential (primary) hypertension: Secondary | ICD-10-CM | POA: Diagnosis not present

## 2019-09-30 DIAGNOSIS — M79676 Pain in unspecified toe(s): Secondary | ICD-10-CM | POA: Diagnosis not present

## 2019-09-30 DIAGNOSIS — B351 Tinea unguium: Secondary | ICD-10-CM | POA: Diagnosis not present

## 2019-10-04 DIAGNOSIS — R2689 Other abnormalities of gait and mobility: Secondary | ICD-10-CM | POA: Diagnosis not present

## 2019-10-05 ENCOUNTER — Telehealth: Payer: Self-pay | Admitting: Adult Health

## 2019-10-05 ENCOUNTER — Encounter: Payer: Self-pay | Admitting: Adult Health

## 2019-10-05 ENCOUNTER — Telehealth (INDEPENDENT_AMBULATORY_CARE_PROVIDER_SITE_OTHER): Payer: PPO | Admitting: Adult Health

## 2019-10-05 VITALS — BP 143/79 | HR 65 | Temp 97.1°F | Resp 18 | Ht 59.0 in | Wt 142.0 lb

## 2019-10-05 DIAGNOSIS — I4821 Permanent atrial fibrillation: Secondary | ICD-10-CM

## 2019-10-05 DIAGNOSIS — I10 Essential (primary) hypertension: Secondary | ICD-10-CM

## 2019-10-05 DIAGNOSIS — R040 Epistaxis: Secondary | ICD-10-CM | POA: Diagnosis not present

## 2019-10-05 DIAGNOSIS — R5381 Other malaise: Secondary | ICD-10-CM | POA: Diagnosis not present

## 2019-10-05 NOTE — Telephone Encounter (Signed)
Returned call to [place] at [facility].She stated patient started having nose bleeds again.Stated after Eliquis was stopped she did not have a nose bleed for 2 weeks.Stated she is taking Aspirin 325 mg daily and she has had 3 bad nose bleeds this morning.Virtual appointment scheduled with Jory Sims DNP at 2:45 pm.

## 2019-10-05 NOTE — Patient Instructions (Signed)
Medication Instructions:  STOP- Aspirin 325 mg  START- Aspirin 81 mg by mouth daily  *If you need a refill on your cardiac medications before your next appointment, please call your pharmacy*   Lab Work: None Ordered   Testing/Procedures: None Ordered   Follow-Up: At [facility], you and your health needs are our priority.  As part of our continuing mission to provide you with exceptional heart care, we have created designated Provider Care Teams.  These Care Teams include your primary Cardiologist (physician) and Advanced Practice Providers (APPs -  Physician Assistants and Nurse Practitioners) who all work together to provide you with the care you need, when you need it.  We recommend signing up for the patient portal called "MyChart".  Sign up information is provided on this After Visit Summary.  MyChart is used to connect with patients for Virtual Visits (Telemedicine).  Patients are able to view lab/test results, encounter notes, upcoming appointments, etc.  Non-urgent messages can be sent to your provider as well.   To learn more about what you can do with MyChart, go to [email or web address].    Your next appointment:   July 8th @ 9:45 pm  The format for your next appointment:   In Person  Provider:   Jory Sims, DNP, ANP

## 2019-10-05 NOTE — Progress Notes (Signed)
Virtual Visit via Telephone Note   This visit type was conducted due to national recommendations for restrictions regarding the COVID-19 Pandemic (e.g. social distancing) in an effort to limit this patient's exposure and mitigate transmission in our community.  Due to her co-morbid illnesses, this patient is at least at moderate risk for complications without adequate follow up.  This format is felt to be most appropriate for this patient at this time.  The patient did not have access to video technology/had technical difficulties with video requiring transitioning to audio format only (telephone).  All issues noted in this document were discussed and addressed.  No physical exam could be performed with this format.  Please refer to the patient's chart for her  consent to telehealth for [facility].   Date:  10/05/2019   ID:  [name], DOB 1925-12-13, MRN [id number]  Patient Location: Home Provider Location: Home  PCP:  Baruch Gouty, FNP  Cardiologist:  Dr. [place]  Electrophysiologist:  None   Evaluation Performed:  Follow-Up Visit  Chief Complaint:  Nose Bleeds   History of Present Illness:    [name] is a 86 y.o. female who presents for ongoing assessment and management of atrial fibrillation on chronic Eliquis and hypertension.  Other history includes hyperlipidemia, prior CVA (right pontine bowel make in 2007) and type 2 diabetes.  She was also noted to have moderate carotid arterial disease with 40 to 59% on the right and less than 39% on the left.  Dr. [place] noted that she was admitted for acute respiratory failure in August 2019 felt to be multifactorial in the setting of acute diastolic CHF and pulmonary fibrosis.  She was found to be allergic to diltiazem causing a rash and this was discontinued.  She has a history of frequent falls. She reports that she trips over her walker, and use furniture to support her when she does not use the walker. On last visit  due to frequent falls and shared decision making, we decided to stop the Eliquis.   We heard from one of the nurses at [facility] who reported that her BP was very elevated ranging up to the 160's but at other times [id number] systolic. She reported that [name] was taking hydralazine 50 mg QID, not TID as prescribed. She was also taking HCTZ 50 mg daily which was not reported to [place] for her medication list on last appt. She was to be on HCTZ 25 mg daily.   Her nurse [name] called today reporting that [name] is having nose bleeds on ASA 325 mg and would like to decrease the dose. We are seeing her today to discuss this.   Her nurse who is with her reports that she is having 3-4 nose bleeds a day. She is not inserting anything into her nose, or sneezing a lot. She does not report any pain. She has not been sent by an ENT. The SNF is drawing a CBC to check her status.   The patient does not have symptoms concerning for COVID-19 infection (fever, chills, cough, or new shortness of breath).    Past Medical History:  Diagnosis Date  . Arthritis   . Atrial fibrillation ([place])   . Cancer ([place])    breast  . Cataract   . Diabetes mellitus without complication ([place])   . Frequent UTI   . Hyperlipidemia   . Hypertension   . Neck pain   . Scoliosis   . Stroke [facility])  Past Surgical History:  Procedure Laterality Date  . APPENDECTOMY    . BREAST LUMPECTOMY Left   . CHOLECYSTECTOMY    . JOINT REPLACEMENT     bilat knee   . REPLACEMENT TOTAL KNEE BILATERAL Bilateral   . TONSILLECTOMY       No outpatient medications have been marked as taking for the 10/05/19 encounter (Telemedicine) with Lendon Colonel, NP.     Allergies:   Diltiazem hcl, Ace inhibitors, and Cymbalta [duloxetine hcl]   Social History   Tobacco Use  . Smoking status: Never Smoker  . Smokeless tobacco: Never Used  Substance Use Topics  . Alcohol use: No    Alcohol/week: 0.0 standard drinks  .  Drug use: No     Family Hx: The patient's family history includes Alcohol abuse in her brother; Arthritis in her mother; Cancer in her brother and mother; Heart disease in her father.  ROS:   Please see the history of present illness.    All other systems reviewed and are negative.   Prior CV studies:   The following studies were reviewed today: Echocardiogram 06/27/2019 1. Left ventricular ejection fraction, by estimation, is 60 to 65%. The  left ventricle has normal function. The left ventricle has no regional  wall motion abnormalities.LV diastolic filling could not be assessed due  to underlying atrial fibrillation.  2. Right ventricular systolic function is normal. The right ventricular  size is normal.  3. The mitral valve is degenerative. No evidence of mitral valve  regurgitation. No evidence of mitral stenosis.  4. The aortic valve is tricuspid. Aortic valve regurgitation is not  visualized. Mild to moderate aortic valve sclerosis/calcification is  present, without any evidence of aortic stenosis.  5. The inferior vena cava is normal in size with greater than 50%  respiratory variability, suggesting right atrial pressure of 3 mmHg.  6. Left atrial size was mild to moderately dilated.   Labs/Other Tests and Data Reviewed:    EKG:  No ECG reviewed.  Recent Labs: 06/26/2019: TSH 0.683 06/29/2019: Magnesium 2.0 07/13/2019: ALT 24 09/12/2019: BUN 22; Creatinine, Ser 0.88; Hemoglobin 11.3; Platelets 282; Potassium 3.9; Sodium 138   Recent Lipid Panel Lab Results  Component Value Date/Time   CHOL 240 (H) 06/27/2019 01:48 AM   CHOL 255 (H) 06/22/2019 01:00 PM   CHOL 176 11/09/2012 04:04 PM   TRIG 88 06/27/2019 01:48 AM   TRIG 189 (H) 07/14/2013 03:25 PM   TRIG 242 (H) 11/09/2012 04:04 PM   HDL 43 06/27/2019 01:48 AM   HDL 42 06/22/2019 01:00 PM   HDL 52 07/14/2013 03:25 PM   HDL 44 11/09/2012 04:04 PM   CHOLHDL 5.6 06/27/2019 01:48 AM   LDLCALC 179 (H) 06/27/2019  01:48 AM   LDLCALC 194 (H) 06/22/2019 01:00 PM   LDLCALC 102 (H) 07/14/2013 03:25 PM   LDLCALC 84 11/09/2012 04:04 PM    Wt Readings from Last 3 Encounters:  10/05/19 142 lb (64.4 kg)  09/12/19 135 lb (61.2 kg)  07/13/19 153 lb (69.4 kg)     Objective:    Vital Signs:  BP (!) 143/79   Pulse 65   Temp (!) 97.1 F (36.2 C)   Resp 18   Ht 4\' 11"  (1.499 m)   Wt 142 lb (64.4 kg)   SpO2 95%   BMI 28.68 kg/m    VITAL SIGNS:  reviewed GEN:  no acute distress CARDIOVASCULAR:  no peripheral edema NEURO:  alert and oriented x 3, no  obvious focal deficit PSYCH:  normal affect  ASSESSMENT & PLAN:    1.Epistaxis: Several nose bleeds a day for the last week. She states that she had nose bleeds in the past on Eliquis but they stopped when it was discontinued. Now with full dose ASA they have returned.   The SNF is getting a CBC and they will fax the results to [place]. I will hold the full dose ASA for 5 days and then she will start  81 mg daily thereafter. She can be referred to ENT specialist by PCP or SNF. She may use [place] to place inside her nose with a Q-tip prn.   I will see her again in one month to check on her status.   2. Atrial fib: She has had several falls in the past and Eliquis was discontinued. Full dose ASA 325 mg was started. Now with above we will decrease the dose. HR is controlled. She denies racing HR.  3. Hypertension: She had orthostatic BP completed at SNF and was not found to be orthostatic although she complained of symptoms of dizziness. Continue clonidine and hydralazine.   4.Fraily: Careful observation by SNF nurses. She has nurses who watch her very closely.  She will use walker to ambulate to avoid any further falls.   COVID-19 Education: The signs and symptoms of COVID-19 were discussed with the patient and how to seek care for testing (follow up with PCP or arrange E-visit).  The importance of social distancing was discussed today.  Time:   Today, I have  spent 20 minutes with the patient with telehealth technology discussing the above problems.     Medication Adjustments/Labs and Tests Ordered: Current medicines are reviewed at length with the patient today.  Concerns regarding medicines are outlined above.   Tests Ordered: No orders of the defined types were placed in this encounter.   Medication Changes: No orders of the defined types were placed in this encounter.   Disposition:  Follow up One month  Signed, Phill Myron. West Pugh, ANP, AACC  10/05/2019 2:47 PM    [facility]

## 2019-10-05 NOTE — Telephone Encounter (Signed)
Pt c/o medication issue:  1. Name of Medication: Aspirin 325 mg by mouth daily  2. How are you currently taking this medication (dosage and times per day)? 1 tablet daily   3. Are you having a reaction (difficulty breathing--STAT)? Yes   4. What is your medication issue? [name]'s nurse is calling stating [name] nose bleeds have came back since switching her from Eliquis to aspirin. Joan would like to know if she should lower her dosage or bring her in to be seen again. Please advise.

## 2019-10-06 ENCOUNTER — Ambulatory Visit: Payer: PPO | Admitting: Cardiology

## 2019-10-06 DIAGNOSIS — I10 Essential (primary) hypertension: Secondary | ICD-10-CM | POA: Diagnosis not present

## 2019-10-06 DIAGNOSIS — D649 Anemia, unspecified: Secondary | ICD-10-CM | POA: Diagnosis not present

## 2019-10-06 DIAGNOSIS — I482 Chronic atrial fibrillation, unspecified: Secondary | ICD-10-CM | POA: Diagnosis not present

## 2019-10-06 DIAGNOSIS — N390 Urinary tract infection, site not specified: Secondary | ICD-10-CM | POA: Diagnosis not present

## 2019-10-11 DIAGNOSIS — I10 Essential (primary) hypertension: Secondary | ICD-10-CM | POA: Diagnosis not present

## 2019-10-11 DIAGNOSIS — I482 Chronic atrial fibrillation, unspecified: Secondary | ICD-10-CM | POA: Diagnosis not present

## 2019-10-11 DIAGNOSIS — N390 Urinary tract infection, site not specified: Secondary | ICD-10-CM | POA: Diagnosis not present

## 2019-10-11 DIAGNOSIS — D649 Anemia, unspecified: Secondary | ICD-10-CM | POA: Diagnosis not present

## 2019-10-13 DIAGNOSIS — M25561 Pain in right knee: Secondary | ICD-10-CM | POA: Diagnosis not present

## 2019-10-17 DIAGNOSIS — N390 Urinary tract infection, site not specified: Secondary | ICD-10-CM | POA: Diagnosis not present

## 2019-10-17 DIAGNOSIS — I10 Essential (primary) hypertension: Secondary | ICD-10-CM | POA: Diagnosis not present

## 2019-10-17 DIAGNOSIS — I482 Chronic atrial fibrillation, unspecified: Secondary | ICD-10-CM | POA: Diagnosis not present

## 2019-10-17 DIAGNOSIS — D649 Anemia, unspecified: Secondary | ICD-10-CM | POA: Diagnosis not present

## 2019-10-27 DIAGNOSIS — I4891 Unspecified atrial fibrillation: Secondary | ICD-10-CM | POA: Diagnosis not present

## 2019-10-27 DIAGNOSIS — R040 Epistaxis: Secondary | ICD-10-CM | POA: Diagnosis not present

## 2019-10-27 DIAGNOSIS — E1142 Type 2 diabetes mellitus with diabetic polyneuropathy: Secondary | ICD-10-CM | POA: Diagnosis not present

## 2019-10-27 DIAGNOSIS — M797 Fibromyalgia: Secondary | ICD-10-CM | POA: Diagnosis not present

## 2019-10-29 DIAGNOSIS — Z79899 Other long term (current) drug therapy: Secondary | ICD-10-CM | POA: Diagnosis not present

## 2019-10-31 ENCOUNTER — Telehealth: Payer: Self-pay

## 2019-11-01 DIAGNOSIS — I10 Essential (primary) hypertension: Secondary | ICD-10-CM | POA: Diagnosis not present

## 2019-11-01 DIAGNOSIS — D649 Anemia, unspecified: Secondary | ICD-10-CM | POA: Diagnosis not present

## 2019-11-01 DIAGNOSIS — J189 Pneumonia, unspecified organism: Secondary | ICD-10-CM | POA: Diagnosis not present

## 2019-11-01 DIAGNOSIS — R05 Cough: Secondary | ICD-10-CM | POA: Diagnosis not present

## 2019-11-01 DIAGNOSIS — R040 Epistaxis: Secondary | ICD-10-CM | POA: Diagnosis not present

## 2019-11-01 DIAGNOSIS — J811 Chronic pulmonary edema: Secondary | ICD-10-CM | POA: Diagnosis not present

## 2019-11-02 DIAGNOSIS — I4891 Unspecified atrial fibrillation: Secondary | ICD-10-CM | POA: Diagnosis not present

## 2019-11-02 DIAGNOSIS — I10 Essential (primary) hypertension: Secondary | ICD-10-CM | POA: Diagnosis not present

## 2019-11-02 DIAGNOSIS — J189 Pneumonia, unspecified organism: Secondary | ICD-10-CM | POA: Diagnosis not present

## 2019-11-02 DIAGNOSIS — R040 Epistaxis: Secondary | ICD-10-CM | POA: Diagnosis not present

## 2019-11-03 DIAGNOSIS — R609 Edema, unspecified: Secondary | ICD-10-CM | POA: Diagnosis not present

## 2019-11-09 DIAGNOSIS — R300 Dysuria: Secondary | ICD-10-CM | POA: Diagnosis not present

## 2019-11-09 DIAGNOSIS — G4700 Insomnia, unspecified: Secondary | ICD-10-CM | POA: Diagnosis not present

## 2019-11-09 DIAGNOSIS — I4891 Unspecified atrial fibrillation: Secondary | ICD-10-CM | POA: Diagnosis not present

## 2019-11-09 DIAGNOSIS — J189 Pneumonia, unspecified organism: Secondary | ICD-10-CM | POA: Diagnosis not present

## 2019-11-09 NOTE — Progress Notes (Signed)
Virtual Visit via Telephone Note   This visit type was conducted due to national recommendations for restrictions regarding the COVID-19 Pandemic (e.g. social distancing) in an effort to limit this patient's exposure and mitigate transmission in our community.  Due to her co-morbid illnesses, this patient is at least at moderate risk for complications without adequate follow up.  This format is felt to be most appropriate for this patient at this time.  The patient did not have access to video technology/had technical difficulties with video requiring transitioning to audio format only (telephone).  All issues noted in this document were discussed and addressed.  No physical exam could be performed with this format.  Please refer to the patient's chart for her  consent to telehealth for [facility].   Date:  11/10/2019   ID:  [name], DOB 01-11-1926, MRN [id number]  Patient Location: [place] Provider Location: Home  PCP:  Baruch Gouty. FNP  Cardiologist: Dr. Martinique  Electrophysiologist:  None   Evaluation Performed:  Follow-Up Visit  Chief Complaint: Epistaxis/Atrial fib   History of Present Illness:    [name] is a 86 y.o. female who presents for ongoing assessment and management of atrial fibrillation on chronic Eliquis and hypertension. Other history includes hyperlipidemia, prior CVA (right pontine bowel make in 2007) and type 2 diabetes. She was also noted to have moderate carotid arterial disease with 40 to 59% on the right and less than 39% on the left.  Dr. Martinique noted that she was admitted for acute respiratory failure in August 2019 felt to be multifactorial in the setting of acute diastolic CHF and pulmonary fibrosis. She was found to be allergic to diltiazem causing a rash and this was discontinued.  She has a history of frequent falls. She reports that she trips over her walker, and use furniture to support her when she does not use  the walker. On last visit due to frequent falls and shared decision making, we decided to stop the Eliquis.   Her nurse who is with her reported that she is having 3-4 nose bleeds a day on last visit via telemedicine video.. She is not inserting anything into her nose, or sneezing a lot. She does not report any pain. She has not been sent by an ENT. The SNF is drawing a CBC to check her status.   I held her full dose aspirin 5 days, then she was to start 81 mg daily thereafter. CBC was ordered.Marland Kitchen  She was due to see ENT.  Of note Eliquis has been discontinued in the past and she was changed to full dose aspirin 325 mg daily.  On review of labs dated 09/12/2019 she has no evidence of anemia.  Blood glucose was elevated however greater than 200.  She reports that she has had pneumonia since being seen last and has been treated at the SNF for this, did not require hospitalization. She has had ASA stopped by SNF provider. She is to see ENT for recurrent nose bleeds on 11/21/2019. She denies headache, CVA symptoms, dizziness. She ambulates the hallway with walker. She is being checked for a UTI due to urinary frequency.    The patient does not have symptoms concerning for COVID-19 infection (fever, chills, cough, or new shortness of breath).    Past Medical History:  Diagnosis Date  . Arthritis   . Atrial fibrillation ([place])   . Cancer ([place])    breast  . Cataract   .  Diabetes mellitus without complication ([place])   . Frequent UTI   . Hyperlipidemia   . Hypertension   . Neck pain   . Scoliosis   . Stroke [facility])    Past Surgical History:  Procedure Laterality Date  . APPENDECTOMY    . BREAST LUMPECTOMY Left   . CHOLECYSTECTOMY    . JOINT REPLACEMENT     bilat knee   . REPLACEMENT TOTAL KNEE BILATERAL Bilateral   . TONSILLECTOMY       Current Meds  Medication Sig  . acetaminophen (TYLENOL) 500 MG tablet Take 1 tablet (500 mg total) by mouth every 6 (six) hours as needed for moderate pain.    Marland Kitchen atorvastatin (LIPITOR) 80 MG tablet Take 1 tablet (80 mg total) by mouth daily at 6 PM. (Patient taking differently: Take 80 mg by mouth at bedtime. )  . Cholecalciferol (VITAMIN D3) 50 MCG (2000 UT) TABS Take 2,000 Units by mouth in the morning.  . cloNIDine (CATAPRES) 0.1 MG tablet Take 0.1 mg by mouth as needed.   . DULoxetine (CYMBALTA) 20 MG capsule Take 1 capsule (20 mg total) by mouth daily.  Marland Kitchen glucose blood (ONE TOUCH ULTRA TEST) test strip USE TO check blood sugars twice daily  . hydrALAZINE (APRESOLINE) 50 MG tablet Take 50 mg by mouth 3 (three) times daily.  . Insulin Pen Needle 31G X 8 MM MISC Use once daily with lantus solostar  . LANTUS SOLOSTAR 100 UNIT/ML Solostar Pen INJECT 50 UNITS ONCE DAILY AT 10PM (Patient taking differently: Inject 10 Units into the skin at bedtime. )  . Magnesium 250 MG TABS Take 250 mg by mouth daily.  . metFORMIN (GLUCOPHAGE) 500 MG tablet TAKE (1) TABLET DAILY WITH BREAKFAST. (Patient taking differently: Take 500 mg by mouth daily with breakfast. )  . nitrofurantoin, macrocrystal-monohydrate, (MACROBID) 100 MG capsule Take 100 mg by mouth daily.  Marland Kitchen NOVOLOG FLEXPEN 100 UNIT/ML FlexPen Sliding scales  . omeprazole (PRILOSEC) 20 MG capsule TAKE  (1)  CAPSULE  TWICE DAILY (TAKE ON AN EMPTY STOMACH AT LEAST 30 MIN- UTES BEFORE MEALS). (Patient taking differently: Take 20 mg by mouth 2 (two) times daily before a meal. )  . ondansetron (ZOFRAN) 4 MG tablet TAKE 1 TABLET EVERY 8 HOURS AS NEEDED FOR NAUSEA & VOMITING (Patient taking differently: Take 4 mg by mouth every 8 (eight) hours as needed for nausea or vomiting. )  . potassium chloride SA (KLOR-CON) 20 MEQ tablet Take 20 mEq by mouth daily.  Marland Kitchen rOPINIRole (REQUIP) 0.25 MG tablet Take 1 tablet (0.25 mg total) by mouth 3 (three) times daily. Take one tablet in the morning, one tablet at noon and two tablets at bedtime. (Patient taking differently: Take 0.25 mg by mouth See admin instructions. Take 0.25 mg by  mouth two times a day and 0.25 mg at bedtime)  . SUPER B COMPLEX/C PO Take 1 tablet by mouth daily.  . [DISCONTINUED] aspirin EC 81 MG tablet Take 81 mg by mouth daily.     Allergies:   Diltiazem hcl, Ace inhibitors, and Cymbalta [duloxetine hcl]   Social History   Tobacco Use  . Smoking status: Never Smoker  . Smokeless tobacco: Never Used  Vaping Use  . Vaping Use: Never used  Substance Use Topics  . Alcohol use: No    Alcohol/week: 0.0 standard drinks  . Drug use: No     Family Hx: The patient's family history includes Alcohol abuse in her brother; Arthritis in her mother; Cancer  in her brother and mother; Heart disease in her father.  ROS:   Please see the history of present illness.    All other systems reviewed and are negative.   Prior CV studies:   The following studies were reviewed today:  Echocardiogram 06/27/2019 1. Left ventricular ejection fraction, by estimation, is 60 to 65%. The  left ventricle has normal function. The left ventricle has no regional  wall motion abnormalities.LV diastolic filling could not be assessed due  to underlying atrial fibrillation.  2. Right ventricular systolic function is normal. The right ventricular  size is normal.  3. The mitral valve is degenerative. No evidence of mitral valve  regurgitation. No evidence of mitral stenosis.  4. The aortic valve is tricuspid. Aortic valve regurgitation is not  visualized. Mild to moderate aortic valve sclerosis/calcification is  present, without any evidence of aortic stenosis.  5. The inferior vena cava is normal in size with greater than 50%  respiratory variability, suggesting right atrial pressure of 3 mmHg.  6. Left atrial size was mild to moderately dilated.   Labs/Other Tests and Data Reviewed:    EKG:  No ECG reviewed.  Recent Labs: 06/26/2019: TSH 0.683 06/29/2019: Magnesium 2.0 07/13/2019: ALT 24 09/12/2019: BUN 22; Creatinine, Ser 0.88; Hemoglobin 11.3; Platelets 282;  Potassium 3.9; Sodium 138   Recent Lipid Panel Lab Results  Component Value Date/Time   CHOL 240 (H) 06/27/2019 01:48 AM   CHOL 255 (H) 06/22/2019 01:00 PM   CHOL 176 11/09/2012 04:04 PM   TRIG 88 06/27/2019 01:48 AM   TRIG 189 (H) 07/14/2013 03:25 PM   TRIG 242 (H) 11/09/2012 04:04 PM   HDL 43 06/27/2019 01:48 AM   HDL 42 06/22/2019 01:00 PM   HDL 52 07/14/2013 03:25 PM   HDL 44 11/09/2012 04:04 PM   CHOLHDL 5.6 06/27/2019 01:48 AM   LDLCALC 179 (H) 06/27/2019 01:48 AM   LDLCALC 194 (H) 06/22/2019 01:00 PM   LDLCALC 102 (H) 07/14/2013 03:25 PM   LDLCALC 84 11/09/2012 04:04 PM    Wt Readings from Last 3 Encounters:  10/05/19 142 lb (64.4 kg)  09/12/19 135 lb (61.2 kg)  07/13/19 153 lb (69.4 kg)     Objective:    Vital Signs:  BP 113/76   Pulse (!) 58   Resp 17  Limited assessment due to telephone call.   VITAL SIGNS:  reviewed GEN:  no acute distress RESPIRATORY:  normal respiratory effort, symmetric expansion NEURO:  alert and oriented x 3, no obvious focal deficit PSYCH:  normal affect  ASSESSMENT & PLAN:    1. Atrial fib: Heart rate is controlled. I spoke with her nurse who reviewed prior vital signs with me. HR has been in the 70's and 80's.  She has been taken off of ASA 81 mg tablet as she continued to have frequent nose bleeds despite decrease in dose. She is not on Eliquis due to frequent falls.  She is not on an specific AV nodal blocking agents with the exception of clonidine which may affect HR.   2.  Hypertension: She is not very active. BP is low normal. She denies dizziness or near syncope with position changes or with walking using walker. Will not make any changes at this time.   3. Diabetes: Followed by PCP  4. Frequent UTI's: Urine sample to check for this is planned today.   COVID-19 Education: The signs and symptoms of COVID-19 were discussed with the patient and how to seek care  for testing (follow up with PCP or arrange E-visit).  The  importance of social distancing was discussed today.  Time:   Today, I have spent 45 minutes with the patient with telehealth technology discussing the above problems.  Also reviewing chart and speaking with nursing personnel.   Medication Adjustments/Labs and Tests Ordered: Current medicines are reviewed at length with the patient today.  Concerns regarding medicines are outlined above.   Tests Ordered: No orders of the defined types were placed in this encounter.   Medication Changes: No orders of the defined types were placed in this encounter.   Disposition:  Follow up one month in person.  Signed, Phill Myron. West Pugh, ANP, [facility]  11/10/2019 10:39 AM    [facility]

## 2019-11-10 ENCOUNTER — Encounter: Payer: Self-pay | Admitting: Adult Health

## 2019-11-10 ENCOUNTER — Telehealth (INDEPENDENT_AMBULATORY_CARE_PROVIDER_SITE_OTHER): Payer: PPO | Admitting: Adult Health

## 2019-11-10 VITALS — BP 113/76 | HR 58 | Resp 17

## 2019-11-10 DIAGNOSIS — I5032 Chronic diastolic (congestive) heart failure: Secondary | ICD-10-CM | POA: Diagnosis not present

## 2019-11-10 DIAGNOSIS — I10 Essential (primary) hypertension: Secondary | ICD-10-CM | POA: Diagnosis not present

## 2019-11-10 DIAGNOSIS — I4811 Longstanding persistent atrial fibrillation: Secondary | ICD-10-CM

## 2019-11-10 DIAGNOSIS — R5381 Other malaise: Secondary | ICD-10-CM | POA: Diagnosis not present

## 2019-11-10 DIAGNOSIS — R040 Epistaxis: Secondary | ICD-10-CM

## 2019-11-10 DIAGNOSIS — I63312 Cerebral infarction due to thrombosis of left middle cerebral artery: Secondary | ICD-10-CM

## 2019-11-10 DIAGNOSIS — L97509 Non-pressure chronic ulcer of other part of unspecified foot with unspecified severity: Secondary | ICD-10-CM | POA: Diagnosis not present

## 2019-11-10 NOTE — Patient Instructions (Signed)
Medication Instructions:  Continue current medications  *If you need a refill on your cardiac medications before your next appointment, please call your pharmacy*   Lab Work: None Ordered   Testing/Procedures: None Ordered   Follow-Up: At [facility], you and your health needs are our priority.  As part of our continuing mission to provide you with exceptional heart care, we have created designated Provider Care Teams.  These Care Teams include your primary Cardiologist (physician) and Advanced Practice Providers (APPs -  Physician Assistants and Nurse Practitioners) who all work together to provide you with the care you need, when you need it.  We recommend signing up for the patient portal called "MyChart".  Sign up information is provided on this After Visit Summary.  MyChart is used to connect with patients for Virtual Visits (Telemedicine).  Patients are able to view lab/test results, encounter notes, upcoming appointments, etc.  Non-urgent messages can be sent to your provider as well.   To learn more about what you can do with MyChart, go to [email or web address].    Your next appointment:   August 13th @ 1:45 pm  The format for your next appointment:   In Person  Provider:   Jory Sims, DNP, ANP

## 2019-11-12 ENCOUNTER — Other Ambulatory Visit: Payer: Self-pay

## 2019-11-12 ENCOUNTER — Emergency Department (HOSPITAL_COMMUNITY)
Admission: EM | Admit: 2019-11-12 | Discharge: 2019-11-12 | Disposition: A | Discharge status: Home / Self Care | Payer: PPO | Attending: Emergency Medicine | Admitting: Emergency Medicine

## 2019-11-12 ENCOUNTER — Encounter (HOSPITAL_COMMUNITY): Payer: Self-pay

## 2019-11-12 DIAGNOSIS — Z79899 Other long term (current) drug therapy: Secondary | ICD-10-CM | POA: Insufficient documentation

## 2019-11-12 DIAGNOSIS — Z96653 Presence of artificial knee joint, bilateral: Secondary | ICD-10-CM | POA: Diagnosis not present

## 2019-11-12 DIAGNOSIS — R040 Epistaxis: Secondary | ICD-10-CM

## 2019-11-12 DIAGNOSIS — I5032 Chronic diastolic (congestive) heart failure: Secondary | ICD-10-CM | POA: Diagnosis not present

## 2019-11-12 DIAGNOSIS — Z794 Long term (current) use of insulin: Secondary | ICD-10-CM | POA: Insufficient documentation

## 2019-11-12 DIAGNOSIS — R319 Hematuria, unspecified: Secondary | ICD-10-CM | POA: Diagnosis not present

## 2019-11-12 DIAGNOSIS — R5381 Other malaise: Secondary | ICD-10-CM | POA: Diagnosis not present

## 2019-11-12 DIAGNOSIS — E1140 Type 2 diabetes mellitus with diabetic neuropathy, unspecified: Secondary | ICD-10-CM | POA: Insufficient documentation

## 2019-11-12 DIAGNOSIS — I110 Hypertensive heart disease with heart failure: Secondary | ICD-10-CM | POA: Insufficient documentation

## 2019-11-12 DIAGNOSIS — R4182 Altered mental status, unspecified: Secondary | ICD-10-CM | POA: Diagnosis not present

## 2019-11-12 DIAGNOSIS — Z853 Personal history of malignant neoplasm of breast: Secondary | ICD-10-CM | POA: Diagnosis not present

## 2019-11-12 NOTE — ED Notes (Signed)
Pts DNR was sent home with family.

## 2019-11-12 NOTE — ED Provider Notes (Signed)
[facility] DEPT Provider Note   CSN: [id number] Arrival date & time: 11/12/19  1932     History Chief Complaint  Patient presents with  . Epistaxis    [name] is a 86 y.o. female.  HPI    Patient presents with concern of nosebleed.  Currently the patient has neither nosebleed nor any other complaints per She notes that she was previously on Eliquis, but is not currently taking any anticoagulant. She has had nosebleeds with some frequency recently, and is scheduled to follow-up with ENT in 9 days. Today she had 2 episodes of nosebleed, both of which resolved prior to ED arrival. She denies other concerns, syncope, dyspnea, pain or any other complaints at all.  Past Medical History:  Diagnosis Date  . Arthritis   . Atrial fibrillation ([place])   . Cancer ([place])    breast  . Cataract   . Diabetes mellitus without complication ([place])   . Frequent UTI   . Hyperlipidemia   . Hypertension   . Neck pain   . Scoliosis   . Stroke [facility])     Patient Active Problem List   Diagnosis Date Noted  . Bilateral subdural hematomas ([place]) 07/16/2019  . CHI (closed head injury), initial encounter 07/14/2019  . Left radial head fracture 07/14/2019  . SDH (subdural hematoma) ([place])   . Hypertensive urgency   . Subarachnoid hemorrhage following injury ([place]) 07/13/2019  . Acute encephalopathy 06/27/2019  . Heat stroke 06/27/2019  . Hypokalemia 06/27/2019  . Fever, unspecified 06/27/2019  . Stroke ([place]) 06/27/2019  . Altered mental status 06/26/2019  . Right hip pain 06/22/2019  . Chronic left hip pain 05/25/2019  . Depression, recurrent ([place]) 05/25/2019  . Chronic anticoagulation 04/19/2019  . Leg cramps 03/04/2019  . Short-term memory loss 03/04/2019  . DOE (dyspnea on exertion) 03/04/2019  . Aortic atherosclerosis ([place]) 03/04/2019  . Physical deconditioning 01/24/2019  . History of recurrent UTIs 12/03/2018  . Chronic pain of right knee  10/08/2018  . Type 2 diabetes mellitus with diabetic neuropathy, unspecified ([place]) 08/12/2018  . Pulmonary vascular congestion   . Chronic diastolic CHF (congestive heart failure) ([place])   . Acute hypoxemic respiratory failure ([place]) 12/06/2017  . Permanent atrial fibrillation ([place]) 12/06/2017  . Trochanteric bursitis, right hip 07/16/2017  . Other intervertebral disc degeneration, lumbar region 07/16/2017  . Osteoporosis 11/04/2016  . Diabetic polyneuropathy associated with type 2 diabetes mellitus ([place]) 11/14/2014  . Fibromyalgia 02/11/2013  . CVA (cerebral vascular accident) ([place]) 08/01/2008  . Osteoarthritis 04/09/2007  . Hyperlipidemia associated with type 2 diabetes mellitus ([place]) 03/08/2007  . Hypertension associated with diabetes ([place]) 03/08/2007  . Pulmonary fibrosis ([place]) 03/08/2007  . GERD 03/08/2007  . BREAST CANCER, HX OF 03/08/2007    Past Surgical History:  Procedure Laterality Date  . APPENDECTOMY    . BREAST LUMPECTOMY Left   . CHOLECYSTECTOMY    . JOINT REPLACEMENT     bilat knee   . REPLACEMENT TOTAL KNEE BILATERAL Bilateral   . TONSILLECTOMY       OB History   No obstetric history on file.     Family History  Problem Relation Age of Onset  . Cancer Mother        lung  . Arthritis Mother   . Heart disease Father        endocarditis  . Cancer Brother        lung, throat  . Alcohol abuse Brother     Social  History   Tobacco Use  . Smoking status: Never Smoker  . Smokeless tobacco: Never Used  Vaping Use  . Vaping Use: Never used  Substance Use Topics  . Alcohol use: No    Alcohol/week: 0.0 standard drinks  . Drug use: No    Home Medications Prior to Admission medications   Medication Sig Start Date End Date Taking? Authorizing Provider  acetaminophen (TYLENOL) 500 MG tablet Take 1 tablet (500 mg total) by mouth every 6 (six) hours as needed for moderate pain. 07/16/19   Arrien, Jimmy Picket, MD  atorvastatin (LIPITOR) 80 MG tablet Take  1 tablet (80 mg total) by mouth daily at 6 PM. Patient taking differently: Take 80 mg by mouth at bedtime.  07/01/19   Charlynne Cousins, MD  Cholecalciferol (VITAMIN D3) 50 MCG (2000 UT) TABS Take 2,000 Units by mouth in the morning.    [provider]  cloNIDine (CATAPRES) 0.1 MG tablet Take 0.1 mg by mouth as needed.  09/26/19   [provider]  DULoxetine (CYMBALTA) 20 MG capsule Take 1 capsule (20 mg total) by mouth daily. 05/25/19 11/10/19  Baruch Gouty, FNP  glucose blood (ONE TOUCH ULTRA TEST) test strip USE TO check blood sugars twice daily 10/30/17   Claretta Fraise, MD  hydrALAZINE (APRESOLINE) 50 MG tablet Take 50 mg by mouth 3 (three) times daily.    [provider]  Insulin Pen Needle 31G X 8 MM MISC Use once daily with lantus solostar 10/30/17   Claretta Fraise, MD  LANTUS SOLOSTAR 100 UNIT/ML Solostar Pen INJECT 50 UNITS ONCE DAILY AT 10PM Patient taking differently: Inject 10 Units into the skin at bedtime.  06/18/19   Baruch Gouty, FNP  Magnesium 250 MG TABS Take 250 mg by mouth daily.    [provider]  metFORMIN (GLUCOPHAGE) 500 MG tablet TAKE (1) TABLET DAILY WITH BREAKFAST. Patient taking differently: Take 500 mg by mouth daily with breakfast.  03/18/19   Rakes, Connye Burkitt, FNP  nitrofurantoin, macrocrystal-monohydrate, (MACROBID) 100 MG capsule Take 100 mg by mouth daily. 10/03/19   [provider]  NOVOLOG FLEXPEN 100 UNIT/ML FlexPen Sliding scales 09/14/19   [provider]  omeprazole (PRILOSEC) 20 MG capsule TAKE  (1)  CAPSULE  TWICE DAILY (TAKE ON AN EMPTY STOMACH AT LEAST 30 MIN- UTES BEFORE MEALS). Patient taking differently: Take 20 mg by mouth 2 (two) times daily before a meal.  05/23/19   Rakes, Connye Burkitt, FNP  ondansetron (ZOFRAN) 4 MG tablet TAKE 1 TABLET EVERY 8 HOURS AS NEEDED FOR NAUSEA & VOMITING Patient taking differently: Take 4 mg by mouth every 8 (eight) hours as needed for nausea or vomiting.  06/20/19   Rakes,  Connye Burkitt, FNP  potassium chloride SA (KLOR-CON) 20 MEQ tablet Take 20 mEq by mouth daily.    [provider]  rOPINIRole (REQUIP) 0.25 MG tablet Take 1 tablet (0.25 mg total) by mouth 3 (three) times daily. Take one tablet in the morning, one tablet at noon and two tablets at bedtime. Patient taking differently: Take 0.25 mg by mouth See admin instructions. Take 0.25 mg by mouth two times a day and 0.25 mg at bedtime 06/23/19 11/10/19  Baruch Gouty, FNP  SUPER B COMPLEX/C PO Take 1 tablet by mouth daily.    [provider]  Insulin Glargine (LANTUS SOLOSTAR) 100 UNIT/ML Solostar Pen INJECT 50 UNITS ONCE DAILY AT 10PM 11/01/18   Baruch Gouty, FNP    Allergies  Diltiazem hcl, Ace inhibitors, and Cymbalta [duloxetine hcl]  Review of Systems   Review of Systems  Constitutional:       Per HPI, otherwise negative  HENT:       Per HPI, otherwise negative  Respiratory:       Per HPI, otherwise negative  Cardiovascular:       Per HPI, otherwise negative  Gastrointestinal: Negative for vomiting.  Endocrine:       Negative aside from HPI  Genitourinary:       Neg aside from HPI   Musculoskeletal:       Per HPI, otherwise negative  Skin: Negative.   Neurological: Negative for syncope.  Hematological:       Per HPI    Physical Exam Updated Vital Signs BP 132/71 (BP Location: Right Arm)   Pulse 77   Temp 98 F (36.7 C) (Oral)   Resp 20   Ht 4\' 11"  (1.499 m)   Wt 60.8 kg   SpO2 96%   BMI 27.06 kg/m   Physical Exam Vitals and nursing note reviewed.  Constitutional:      General: She is not in acute distress.    Appearance: She is well-developed.  HENT:     Head: Normocephalic and atraumatic.     Nose:   Eyes:     Conjunctiva/sclera: Conjunctivae normal.  Pulmonary:     Effort: Pulmonary effort is normal. No respiratory distress.     Breath sounds: No stridor.  Abdominal:     General: There is no distension.  Skin:    General: Skin is warm and dry.    Neurological:     Mental Status: She is alert and oriented to person, place, and time.     Cranial Nerves: No cranial nerve deficit.     ED Results / Procedures / Treatments    Procedures Procedures (including critical care time)  Medications Ordered in ED Medications - No data to display  ED Course  I have reviewed the triage vital signs and the nursing notes.  Pertinent labs & imaging results that were available during my care of the patient were reviewed by me and considered in my medical decision making (see chart for details).  Elderly female presents with concern for epistaxis, resolved prior to my evaluation Patient is an ongoing anticoagulant, is not hemodynamically unstable, has no active bleeding, is awake, alert, monitored for some time in the ED with no recurrence of her bleeding. She and I discussed home care instructions should she have a recurrence, and with previously scheduled outpatient ENT follow-up she was discharged in stable condition. Final Clinical Impression(s) / ED Diagnoses Final diagnoses:  Epistaxis    Rx / DC Orders ED Discharge Orders    None       Carmin Muskrat, MD 11/12/19 2121

## 2019-11-12 NOTE — ED Triage Notes (Signed)
Per EMS, [facility] nosebleed today in right nare. Pt has hx of them, no trauma to area, currently bleed is under control.

## 2019-11-12 NOTE — Discharge Instructions (Addendum)
As discussed, your evaluation today has been largely reassuring.  But, it is important that you monitor your condition carefully, and do not hesitate to return to the ED if you develop new, or concerning changes in your condition. ? ?Otherwise, please follow-up with your physician for appropriate ongoing care. ? ?

## 2019-11-21 ENCOUNTER — Telehealth: Payer: Self-pay

## 2019-11-21 DIAGNOSIS — J343 Hypertrophy of nasal turbinates: Secondary | ICD-10-CM | POA: Diagnosis not present

## 2019-11-21 DIAGNOSIS — Z9089 Acquired absence of other organs: Secondary | ICD-10-CM | POA: Diagnosis not present

## 2019-11-21 DIAGNOSIS — R040 Epistaxis: Secondary | ICD-10-CM | POA: Diagnosis not present

## 2019-11-21 DIAGNOSIS — J3489 Other specified disorders of nose and nasal sinuses: Secondary | ICD-10-CM | POA: Diagnosis not present

## 2019-12-06 ENCOUNTER — Encounter: Payer: Self-pay | Admitting: Adult Health

## 2019-12-07 DIAGNOSIS — D649 Anemia, unspecified: Secondary | ICD-10-CM | POA: Diagnosis not present

## 2019-12-07 DIAGNOSIS — I4891 Unspecified atrial fibrillation: Secondary | ICD-10-CM | POA: Diagnosis not present

## 2019-12-07 DIAGNOSIS — I482 Chronic atrial fibrillation, unspecified: Secondary | ICD-10-CM | POA: Diagnosis not present

## 2019-12-07 DIAGNOSIS — N390 Urinary tract infection, site not specified: Secondary | ICD-10-CM | POA: Diagnosis not present

## 2019-12-07 DIAGNOSIS — I10 Essential (primary) hypertension: Secondary | ICD-10-CM | POA: Diagnosis not present

## 2019-12-07 DIAGNOSIS — G4700 Insomnia, unspecified: Secondary | ICD-10-CM | POA: Diagnosis not present

## 2019-12-07 DIAGNOSIS — R600 Localized edema: Secondary | ICD-10-CM | POA: Diagnosis not present

## 2019-12-07 DIAGNOSIS — R0989 Other specified symptoms and signs involving the circulatory and respiratory systems: Secondary | ICD-10-CM | POA: Diagnosis not present

## 2019-12-07 DIAGNOSIS — R040 Epistaxis: Secondary | ICD-10-CM | POA: Diagnosis not present

## 2019-12-16 ENCOUNTER — Ambulatory Visit: Payer: PPO | Admitting: Adult Health

## 2019-12-23 ENCOUNTER — Ambulatory Visit (INDEPENDENT_AMBULATORY_CARE_PROVIDER_SITE_OTHER): Payer: PPO | Admitting: Licensed Clinical Social Worker

## 2019-12-23 ENCOUNTER — Encounter: Payer: PPO | Admitting: Vascular Surgery

## 2019-12-23 DIAGNOSIS — M8949 Other hypertrophic osteoarthropathy, multiple sites: Secondary | ICD-10-CM | POA: Diagnosis not present

## 2019-12-23 DIAGNOSIS — Z794 Long term (current) use of insulin: Secondary | ICD-10-CM

## 2019-12-23 DIAGNOSIS — E1169 Type 2 diabetes mellitus with other specified complication: Secondary | ICD-10-CM

## 2019-12-23 DIAGNOSIS — E1140 Type 2 diabetes mellitus with diabetic neuropathy, unspecified: Secondary | ICD-10-CM

## 2019-12-23 DIAGNOSIS — E1159 Type 2 diabetes mellitus with other circulatory complications: Secondary | ICD-10-CM | POA: Diagnosis not present

## 2019-12-23 DIAGNOSIS — M159 Polyosteoarthritis, unspecified: Secondary | ICD-10-CM

## 2019-12-23 DIAGNOSIS — K219 Gastro-esophageal reflux disease without esophagitis: Secondary | ICD-10-CM

## 2019-12-23 DIAGNOSIS — E785 Hyperlipidemia, unspecified: Secondary | ICD-10-CM

## 2019-12-23 DIAGNOSIS — M150 Primary generalized (osteo)arthritis: Secondary | ICD-10-CM

## 2019-12-23 DIAGNOSIS — M797 Fibromyalgia: Secondary | ICD-10-CM

## 2019-12-23 DIAGNOSIS — I10 Essential (primary) hypertension: Secondary | ICD-10-CM

## 2019-12-23 NOTE — Chronic Care Management (AMB) (Signed)
Chronic Care Management    Clinical Social Work Follow Up Note  12/23/2019 Name: [name] MRN: [id number] DOB: 15-Aug-1925  [name] is a 86 y.o. year old female who is a primary care patient of [place] ,Fransisca Kaufmann MD. The CCM team was consulted for assistance with [employer] .   Review of patient status, including review of consultants reports, other relevant assessments, and collaboration with appropriate care team members and the patient's provider was performed as part of comprehensive patient evaluation and provision of chronic care management services.    SDOH (Social Determinants of Health) assessments performed: No;risk for tobacco use; risk for depression; risk for physical inactivity; risk for social isolation    Office Visit from 05/25/2019 in [place]  PHQ-9 Total Score 13       Outpatient Encounter Medications as of 12/23/2019  Medication Sig Note  . acetaminophen (TYLENOL) 500 MG tablet Take 1 tablet (500 mg total) by mouth every 6 (six) hours as needed for moderate pain.   Marland Kitchen atorvastatin (LIPITOR) 80 MG tablet Take 1 tablet (80 mg total) by mouth daily at 6 PM. (Patient taking differently: Take 80 mg by mouth at bedtime. )   . Cholecalciferol (VITAMIN D3) 50 MCG (2000 UT) TABS Take 2,000 Units by mouth in the morning.   . cloNIDine (CATAPRES) 0.1 MG tablet Take 0.1 mg by mouth as needed.    . DULoxetine (CYMBALTA) 20 MG capsule Take 1 capsule (20 mg total) by mouth daily. 07/13/2019: Makes patient feel "restless and scared" and unable to sleep  . glucose blood (ONE TOUCH ULTRA TEST) test strip USE TO check blood sugars twice daily   . hydrALAZINE (APRESOLINE) 50 MG tablet Take 50 mg by mouth 3 (three) times daily.   . Insulin Pen Needle 31G X 8 MM MISC Use once daily with lantus solostar   . LANTUS SOLOSTAR 100 UNIT/ML Solostar Pen INJECT 50 UNITS ONCE DAILY AT 10PM (Patient taking differently: Inject 10 Units into the skin at  bedtime. )   . Magnesium 250 MG TABS Take 250 mg by mouth daily.   . metFORMIN (GLUCOPHAGE) 500 MG tablet TAKE (1) TABLET DAILY WITH BREAKFAST. (Patient taking differently: Take 500 mg by mouth daily with breakfast. )   . nitrofurantoin, macrocrystal-monohydrate, (MACROBID) 100 MG capsule Take 100 mg by mouth daily.   Marland Kitchen NOVOLOG FLEXPEN 100 UNIT/ML FlexPen Sliding scales   . omeprazole (PRILOSEC) 20 MG capsule TAKE  (1)  CAPSULE  TWICE DAILY (TAKE ON AN EMPTY STOMACH AT LEAST 30 MIN- UTES BEFORE MEALS). (Patient taking differently: Take 20 mg by mouth 2 (two) times daily before a meal. )   . ondansetron (ZOFRAN) 4 MG tablet TAKE 1 TABLET EVERY 8 HOURS AS NEEDED FOR NAUSEA & VOMITING (Patient taking differently: Take 4 mg by mouth every 8 (eight) hours as needed for nausea or vomiting. )   . potassium chloride SA (KLOR-CON) 20 MEQ tablet Take 20 mEq by mouth daily.   Marland Kitchen rOPINIRole (REQUIP) 0.25 MG tablet Take 1 tablet (0.25 mg total) by mouth 3 (three) times daily. Take one tablet in the morning, one tablet at noon and two tablets at bedtime. (Patient taking differently: Take 0.25 mg by mouth See admin instructions. Take 0.25 mg by mouth two times a day and 0.25 mg at bedtime)   . SUPER B COMPLEX/C PO Take 1 tablet by mouth daily.   . [DISCONTINUED] Insulin Glargine (LANTUS SOLOSTAR) 100 UNIT/ML Solostar Pen INJECT  16 UNITS ONCE DAILY AT 10PM    No facility-administered encounter medications on file as of 12/23/2019.    Goals Addressed              This Visit's Progress   .  "I wish I could get out of the house and go to the grocery store" (pt-stated)        =Current Barriers:   Lacks caregiver support.    Social isolation in patient with Chronic Diagnoses of Fibromyalgia, Type 2 DM, Hyperlipidemia, Osteoarthritis, GERD, and HTN   Nurse Case Manager Clinical Goal(s):  Marland Kitchen Over the next 30 days, patient will work with CM clinical social worker to address psychosocial issues related to social  isolation.   Interventions:  . Patient encouraged to reach out with any medical or psychosocial needs  Talked with client about pain issues of client  Talked with client about appetite of client  Talked with  client about patient participation in activities at [facility] facility in [place], [place]  Talked with client  about client socialization activities at facility  Talked with  client about vision of client  Talked with  client  about ambulation of client  Talked with  client  about social support network of client  Talked with client about family support   Patient Self Care Activities:  . Self administers medications as prescribed . Attends all scheduled provider appointments . Calls pharmacy for medication refills  Initial goal documentation      Follow Up Plan: [name] to call client/Andy Burt Ek, son,in next 4 weeks to assess the psychosocial needs of client at that time  Norva Riffle.Hiliary Osorto MSW, [name] Licensed Clinical Social Worker [facility] [phone]

## 2019-12-23 NOTE — Patient Instructions (Addendum)
Licensed Clinical Social Worker Visit Information  Goals we discussed today:  Goals Addressed              This Visit's Progress   .  "I wish I could get out of the house and go to the grocery store" (pt-stated)        =Current Barriers:   Lacks caregiver support.    Social isolation in patient with Chronic Diagnoses of Fibromyalgia, Type 2 DM, Hyperlipidemia, Osteoarthritis, GERD, and HTN   Nurse Case Manager Clinical Goal(s):  Marland Kitchen Over the next 30 days, patient will work with CM clinical social worker to address psychosocial issues related to social isolation.   Interventions:     .  "I wish I could get out of the house and go to the grocery store" (pt-stated)          =Current Barriers:   Lacks caregiver support.    Social isolation in patient with Chronic Diagnoses of Fibromyalgia, Type 2 DM, Hyperlipidemia, Osteoarthritis, GERD, and HTN   Nurse Case Manager Clinical Goal(s):   Over the next 30 days, patient will work with CM clinical social worker to address psychosocial issues related to social isolation.   Interventions:   Patient encouraged to reach out with any medical or psychosocial needs  Talked with client about pain issues of client  Talked with client about appetite of client  Talked with  client about patient participation in activities at [facility] facility in [place], [place]  Talked with client  about client socialization activities at facility  Talked with  client about vision of client  Talked with  client  about ambulation of client  Talked with  client  about social support network of client  Talked with client about family support   Patient Self Care Activities:   Self administers medications as prescribed  Attends all scheduled provider appointments  Calls pharmacy for medication refills  Initial goal documentation      Follow Up Plan: [name] to call client/Andy Burt Ek, son,in next 4 weeks to assess the  psychosocial needs of client at that time  Initial goal documentation      Materials Provided: No  The patient verbalized understanding of instructions provided today and declined a print copy of patient instruction materials.   Norva Riffle.Abram Sax MSW, [name] Licensed Clinical Social Worker [facility] [phone]

## 2019-12-29 DIAGNOSIS — Z0000 Encounter for general adult medical examination without abnormal findings: Secondary | ICD-10-CM | POA: Diagnosis not present

## 2019-12-29 DIAGNOSIS — R600 Localized edema: Secondary | ICD-10-CM | POA: Diagnosis not present

## 2019-12-29 DIAGNOSIS — R0989 Other specified symptoms and signs involving the circulatory and respiratory systems: Secondary | ICD-10-CM | POA: Diagnosis not present

## 2019-12-29 DIAGNOSIS — E1142 Type 2 diabetes mellitus with diabetic polyneuropathy: Secondary | ICD-10-CM | POA: Diagnosis not present

## 2019-12-29 DIAGNOSIS — I70209 Unspecified atherosclerosis of native arteries of extremities, unspecified extremity: Secondary | ICD-10-CM | POA: Diagnosis not present

## 2019-12-30 DIAGNOSIS — M79676 Pain in unspecified toe(s): Secondary | ICD-10-CM | POA: Diagnosis not present

## 2019-12-30 DIAGNOSIS — E1142 Type 2 diabetes mellitus with diabetic polyneuropathy: Secondary | ICD-10-CM | POA: Diagnosis not present

## 2019-12-30 DIAGNOSIS — B351 Tinea unguium: Secondary | ICD-10-CM | POA: Diagnosis not present

## 2019-12-30 DIAGNOSIS — L84 Corns and callosities: Secondary | ICD-10-CM | POA: Diagnosis not present

## 2020-01-04 DIAGNOSIS — J302 Other seasonal allergic rhinitis: Secondary | ICD-10-CM | POA: Diagnosis not present

## 2020-01-04 DIAGNOSIS — R600 Localized edema: Secondary | ICD-10-CM | POA: Diagnosis not present

## 2020-01-04 DIAGNOSIS — I10 Essential (primary) hypertension: Secondary | ICD-10-CM | POA: Diagnosis not present

## 2020-01-04 DIAGNOSIS — I739 Peripheral vascular disease, unspecified: Secondary | ICD-10-CM | POA: Diagnosis not present

## 2020-01-04 DIAGNOSIS — G4700 Insomnia, unspecified: Secondary | ICD-10-CM | POA: Diagnosis not present

## 2020-01-05 NOTE — Progress Notes (Signed)
Cardiology Office Note   Date:  01/06/2020   ID:  [name], DOB 03/09/1926, MRN [id number]  PCP:  Lendon Colonel, NP  Cardiologist: Dr. Martinique VE:LFYBOF Up   History of Present Illness: [name] is a 86 y.o. female who presents for ongoing assessment and management of atrial fibrillation on chronic Eliquis, carotid artery disease with 40 to 59% on the right and less than 39% on the left, and hypertension.  Other history includes hyperlipidemia with prior CVA (right pontine, and 2007) and type 2 diabetes.  [name] has a history of frequent falls and reports that she tripped over her walker and does use furniture to support her when she does not use her walker for ambulation.  Eliquis was stopped with shared decision making due to risk outweighing benefits of being on Eliquis with frequent falls.  Aspirin was also held because she was having frequent epistaxis episodes.  She was to see an ENT for follow-up.  She did follow-up with ENT but was unhappy with his bedside manner and has chosen to find a different one. She has had three more epistaxis episodes. She continues to have considerable arthritis pain. She is using her walker for ambulation and has not had any falls.   Past Medical History:  Diagnosis Date  . Arthritis   . Atrial fibrillation ([place])   . Cancer ([place])    breast  . Cataract   . Diabetes mellitus without complication ([place])   . Frequent UTI   . Hyperlipidemia   . Hypertension   . Neck pain   . Scoliosis   . Stroke [facility])     Past Surgical History:  Procedure Laterality Date  . APPENDECTOMY    . BREAST LUMPECTOMY Left   . CHOLECYSTECTOMY    . JOINT REPLACEMENT     bilat knee   . REPLACEMENT TOTAL KNEE BILATERAL Bilateral   . TONSILLECTOMY       Current Outpatient Medications  Medication Sig Dispense Refill  . acetaminophen (TYLENOL) 500 MG tablet Take 1 tablet (500 mg total) by mouth every 6 (six) hours as needed for moderate pain.  30 tablet 0  . atorvastatin (LIPITOR) 80 MG tablet Take 1 tablet (80 mg total) by mouth daily at 6 PM. (Patient taking differently: Take 80 mg by mouth at bedtime. ) 30 tablet 3  . Cholecalciferol (VITAMIN D3) 50 MCG (2000 UT) TABS Take 2,000 Units by mouth in the morning.    . cloNIDine (CATAPRES) 0.1 MG tablet Take 0.1 mg by mouth as needed.     Marland Kitchen glucose blood (ONE TOUCH ULTRA TEST) test strip USE TO check blood sugars twice daily 100 each 12  . hydrALAZINE (APRESOLINE) 50 MG tablet Take 50 mg by mouth 3 (three) times daily.    . Insulin Pen Needle 31G X 8 MM MISC Use once daily with lantus solostar 100 each 6  . LANTUS SOLOSTAR 100 UNIT/ML Solostar Pen INJECT 50 UNITS ONCE DAILY AT 10PM (Patient taking differently: Inject 10 Units into the skin at bedtime. ) 15 mL 0  . Magnesium 250 MG TABS Take 250 mg by mouth daily.    . metFORMIN (GLUCOPHAGE) 500 MG tablet TAKE (1) TABLET DAILY WITH BREAKFAST. (Patient taking differently: Take 500 mg by mouth daily with breakfast. ) 90 tablet 1  . nitrofurantoin, macrocrystal-monohydrate, (MACROBID) 100 MG capsule Take 100 mg by mouth daily.    Marland Kitchen NOVOLOG FLEXPEN 100 UNIT/ML FlexPen Sliding scales    . omeprazole (  PRILOSEC) 20 MG capsule TAKE  (1)  CAPSULE  TWICE DAILY (TAKE ON AN EMPTY STOMACH AT LEAST 30 MIN- UTES BEFORE MEALS). (Patient taking differently: Take 20 mg by mouth 2 (two) times daily before a meal. ) 180 capsule 0  . ondansetron (ZOFRAN) 4 MG tablet TAKE 1 TABLET EVERY 8 HOURS AS NEEDED FOR NAUSEA & VOMITING (Patient taking differently: Take 4 mg by mouth every 8 (eight) hours as needed for nausea or vomiting. ) 20 tablet 0  . potassium chloride SA (KLOR-CON) 20 MEQ tablet Take 20 mEq by mouth daily.    . SUPER B COMPLEX/C PO Take 1 tablet by mouth daily.    . DULoxetine (CYMBALTA) 20 MG capsule Take 1 capsule (20 mg total) by mouth daily. 30 capsule 6  . rOPINIRole (REQUIP) 0.25 MG tablet Take 1 tablet (0.25 mg total) by mouth 3 (three) times  daily. Take one tablet in the morning, one tablet at noon and two tablets at bedtime. (Patient taking differently: Take 0.25 mg by mouth See admin instructions. Take 0.25 mg by mouth two times a day and 0.25 mg at bedtime) 120 tablet 3   No current facility-administered medications for this visit.    Allergies:   Diltiazem hcl, Ace inhibitors, and Cymbalta [duloxetine hcl]    Social History:  The patient  reports that she has never smoked. She has never used smokeless tobacco. She reports that she does not drink alcohol and does not use drugs.   Family History:  The patient's family history includes Alcohol abuse in her brother; Arthritis in her mother; Cancer in her brother and mother; Heart disease in her father.    ROS: All other systems are reviewed and negative. Unless otherwise mentioned in H&P    PHYSICAL EXAM: VS:  BP 140/70   Pulse 72   Ht 4\' 11"  (1.499 m)   Wt 139 lb (63 kg)   SpO2 94%   BMI 28.07 kg/m  , BMI Body mass index is 28.07 kg/m. GEN: Well nourished, well developed, in no acute distress HEENT: normal Neck: no JVD, carotid bruits, or masses Cardiac: IRRR; soft systolic murmur, rubs, or gallops,no edema  Respiratory:  Clear to auscultation bilaterally, normal work of breathing GI: soft, nontender, nondistended, + BS MS: no deformity or atrophy, kyphosis noted, nodular abnormalities bilateral phalanges.  Skin: warm and dry, no rash Neuro:  Strength is diminished and sensation is intact Psych: euthymic mood, full affect   EKG: Not completed this office visit  Recent Labs: 06/26/2019: TSH 0.683 06/29/2019: Magnesium 2.0 07/13/2019: ALT 24 09/12/2019: BUN 22; Creatinine, Ser 0.88; Hemoglobin 11.3; Platelets 282; Potassium 3.9; Sodium 138    Lipid Panel    Component Value Date/Time   CHOL 240 (H) 06/27/2019 0148   CHOL 255 (H) 06/22/2019 1300   CHOL 176 11/09/2012 1604   TRIG 88 06/27/2019 0148   TRIG 189 (H) 07/14/2013 1525   TRIG 242 (H) 11/09/2012  1604   HDL 43 06/27/2019 0148   HDL 42 06/22/2019 1300   HDL 52 07/14/2013 1525   HDL 44 11/09/2012 1604   CHOLHDL 5.6 06/27/2019 0148   VLDL 18 06/27/2019 0148   LDLCALC 179 (H) 06/27/2019 0148   LDLCALC 194 (H) 06/22/2019 1300   LDLCALC 102 (H) 07/14/2013 1525   LDLCALC 84 11/09/2012 1604      Wt Readings from Last 3 Encounters:  01/06/20 139 lb (63 kg)  11/12/19 134 lb (60.8 kg)  10/05/19 142 lb (64.4 kg)  Other studies Reviewed: Echocardiogram July 07, 2019 1. Left ventricular ejection fraction, by estimation, is 60 to 65%. The  left ventricle has normal function. The left ventricle has no regional  wall motion abnormalities.LV diastolic filling could not be assessed due  to underlying atrial fibrillation.  2. Right ventricular systolic function is normal. The right ventricular  size is normal.  3. The mitral valve is degenerative. No evidence of mitral valve  regurgitation. No evidence of mitral stenosis.  4. The aortic valve is tricuspid. Aortic valve regurgitation is not  visualized. Mild to moderate aortic valve sclerosis/calcification is  present, without any evidence of aortic stenosis.  5. The inferior vena cava is normal in size with greater than 50%  respiratory variability, suggesting right atrial pressure of 3 mmHg.  6. Left atrial size was mild to moderately dilated.    ASSESSMENT AND PLAN:  1. Chronic atrial fibrillation: She is unable to tolerate Eliquis and aspirin as this is causing significant epistaxis. She is also having frequent falls due to frailty and arthritis. She continues to do well otherwise and denies any rapid heart rhythm or dizziness associated with atrial fib. She has not very active due to her frailty and denies any dyspnea on exertion.  2. Chronic arthritis pain: She does not like to take Tylenol as she states it is not helpful to her. She is asking for help concerning pain control. I think it would be best if she see a  rheumatologist as they may be able to provide better options for her concerning pain control and or treatment of chronic arthritis sequela. Will review rheumatologist available in the area before referral  3. Hypertension: Much better controlled on current medication regimen. No changes at this time.  4. Chronic dependent edema: She is given information on elastic therapy via flyer. There is a Estate manager/land agent. It may be helpful for her to be measured and have these placed during the day to assist with dependent edema and help for with some discomfort.  4. Hyperlipidemia: Continues on high-dose atorvastatin 80 mg daily. Due to her age may need to consider decreasing the dose. Will be discussed with Dr. Martinique on follow-up  Current medicines are reviewed at length with the patient today.  I have spent 25 minutes  dedicated to the care of this patient on the date of this encounter to include pre-visit review of records, assessment, management and diagnostic testing,with shared decision making.  Labs/ tests ordered today include: Will need follow-up BMET and CBC if not completed by PCP on next office visit.She requests Virtual office visit next time.  Phill Myron. West Pugh, ANP, AACC   01/06/2020 12:34 PM    [facility] [address] Office [phone] Fax [phone]  Notice: This dictation was prepared with Dragon dictation along with smaller phrase technology. Any transcriptional errors that result from this process are unintentional and may not be corrected upon review.

## 2020-01-06 ENCOUNTER — Other Ambulatory Visit: Payer: Self-pay

## 2020-01-06 ENCOUNTER — Ambulatory Visit (INDEPENDENT_AMBULATORY_CARE_PROVIDER_SITE_OTHER): Payer: PPO | Admitting: Adult Health

## 2020-01-06 ENCOUNTER — Encounter: Payer: Self-pay | Admitting: Adult Health

## 2020-01-06 VITALS — BP 140/70 | HR 72 | Ht 59.0 in | Wt 139.0 lb

## 2020-01-06 DIAGNOSIS — I4811 Longstanding persistent atrial fibrillation: Secondary | ICD-10-CM

## 2020-01-06 DIAGNOSIS — E1169 Type 2 diabetes mellitus with other specified complication: Secondary | ICD-10-CM

## 2020-01-06 DIAGNOSIS — M1990 Unspecified osteoarthritis, unspecified site: Secondary | ICD-10-CM

## 2020-01-06 DIAGNOSIS — R5381 Other malaise: Secondary | ICD-10-CM | POA: Diagnosis not present

## 2020-01-06 DIAGNOSIS — I10 Essential (primary) hypertension: Secondary | ICD-10-CM

## 2020-01-06 DIAGNOSIS — E785 Hyperlipidemia, unspecified: Secondary | ICD-10-CM | POA: Diagnosis not present

## 2020-01-06 NOTE — Patient Instructions (Signed)
Medication Instructions:  You may take Tylenol PM or plain Benadryl as needed for insomnia.  *If you need a refill on your cardiac medications before your next appointment, please call your pharmacy*   Lab Work: None ordered If you have labs (blood work) drawn today and your tests are completely normal, you will receive your results only by: Marland Kitchen MyChart Message (if you have MyChart) OR . A paper copy in the mail If you have any lab test that is abnormal or we need to change your treatment, we will call you to review the results.   Testing/Procedures: None ordered   Follow-Up: At [facility], you and your health needs are our priority.  As part of our continuing mission to provide you with exceptional heart care, we have created designated Provider Care Teams.  These Care Teams include your primary Cardiologist (physician) and Advanced Practice Providers (APPs -  Physician Assistants and Nurse Practitioners) who all work together to provide you with the care you need, when you need it.  We recommend signing up for the patient portal called "MyChart".  Sign up information is provided on this After Visit Summary.  MyChart is used to connect with patients for Virtual Visits (Telemedicine).  Patients are able to view lab/test results, encounter notes, upcoming appointments, etc.  Non-urgent messages can be sent to your provider as well.   To learn more about what you can do with MyChart, go to [email or web address].    Your next appointment:   6 month(s)  The format for your next appointment:   Virtual Visit   Provider:   You may see Dr. Martinique or one of the following Advanced Practice Providers on your designated Care Team:    Kerin Ransom, PA-C  [place], [place]  Coletta Memos, [place]    Other Instructions An information sheet has been given about Elastic Therapy. Please call with any questions or concerns.

## 2020-01-13 DIAGNOSIS — N390 Urinary tract infection, site not specified: Secondary | ICD-10-CM | POA: Diagnosis not present

## 2020-01-30 ENCOUNTER — Ambulatory Visit (INDEPENDENT_AMBULATORY_CARE_PROVIDER_SITE_OTHER): Payer: PPO | Admitting: Licensed Clinical Social Worker

## 2020-01-30 DIAGNOSIS — J8410 Pulmonary fibrosis, unspecified: Secondary | ICD-10-CM

## 2020-01-30 DIAGNOSIS — Z853 Personal history of malignant neoplasm of breast: Secondary | ICD-10-CM

## 2020-01-30 DIAGNOSIS — E1140 Type 2 diabetes mellitus with diabetic neuropathy, unspecified: Secondary | ICD-10-CM | POA: Diagnosis not present

## 2020-01-30 DIAGNOSIS — I152 Hypertension secondary to endocrine disorders: Secondary | ICD-10-CM

## 2020-01-30 DIAGNOSIS — E1159 Type 2 diabetes mellitus with other circulatory complications: Secondary | ICD-10-CM | POA: Diagnosis not present

## 2020-01-30 DIAGNOSIS — E785 Hyperlipidemia, unspecified: Secondary | ICD-10-CM

## 2020-01-30 DIAGNOSIS — K219 Gastro-esophageal reflux disease without esophagitis: Secondary | ICD-10-CM

## 2020-01-30 DIAGNOSIS — I10 Essential (primary) hypertension: Secondary | ICD-10-CM | POA: Diagnosis not present

## 2020-01-30 DIAGNOSIS — M159 Polyosteoarthritis, unspecified: Secondary | ICD-10-CM

## 2020-01-30 DIAGNOSIS — M797 Fibromyalgia: Secondary | ICD-10-CM

## 2020-01-30 DIAGNOSIS — I63312 Cerebral infarction due to thrombosis of left middle cerebral artery: Secondary | ICD-10-CM

## 2020-01-30 DIAGNOSIS — Z794 Long term (current) use of insulin: Secondary | ICD-10-CM

## 2020-01-30 DIAGNOSIS — I4821 Permanent atrial fibrillation: Secondary | ICD-10-CM

## 2020-01-30 DIAGNOSIS — Z604 Social exclusion and rejection: Secondary | ICD-10-CM

## 2020-01-30 DIAGNOSIS — E1169 Type 2 diabetes mellitus with other specified complication: Secondary | ICD-10-CM

## 2020-01-30 DIAGNOSIS — M8949 Other hypertrophic osteoarthropathy, multiple sites: Secondary | ICD-10-CM | POA: Diagnosis not present

## 2020-01-30 NOTE — Patient Instructions (Addendum)
Licensed Clinical Social Worker Visit Information  Goals we discussed today:   .  Social Isolation        Current Barriers:   Social isolation due to Covid-19 quarantine in a 86 year old patient with hypertension, Afib, CHF, DM, pulmonary fibrosis, and osteoarthritis and history of breast cancer.   Nurse Case Manager Clinical Goal(s):   Over the next 90 days, patient will continue to talk with the CCM team regularly   Over the next 90 days, patient will continue to keep in contact with her family  Interventions:  Talked with [name], son of client, about client adjustment to Independent Living residence at [facility] in [place], [place] Talked with [name] about pain issues of client Talked with [name] about recent fall of client Talked with [name] about ambulation needs of client (uses a walker to ambulate) Talked with [name] about appetite of client Talked with [name] about social isolation of client (client is socializing with others at [place]) Talked with [name] about upcoming client appointments Collaborated with [facility] about nursing needs of client  Patient Self Care Activities:   Performs ADL's independently  Performs IADL's independently  Initial goal documentation    Follow Up Plan:[name] to call client/[name], son,in next 4 weeks to assess the psychosocial needs of client at that time  Materials Provided: No  The patient/[name], son of client,  verbalized understanding of instructions provided today and declined a print copy of patient instruction materials.   [name].[name] MSW, [name] Licensed Clinical Social Worker [facility] [phone]

## 2020-01-30 NOTE — Chronic Care Management (AMB) (Signed)
Chronic Care Management    Clinical Social Work Follow Up Note  01/30/2020 Name: [name] MRN: [id number] DOB: September 02, 1925  [name] is a 86 y.o. year old female who is a primary care patient of Joan Colonel, NP. The CCM team was consulted for assistance with [employer] .   Review of patient status, including review of consultants reports, other relevant assessments, and collaboration with appropriate care team members and the patient's provider was performed as part of comprehensive patient evaluation and provision of chronic care management services.    SDOH (Social Determinants of Health) assessments performed: No; risk for tobacco use; risk for depression; risk for stress; risk for physical inactivity    Office Visit from 05/25/2019 in [facility]  PHQ-9 Total Score 13      Outpatient Encounter Medications as of 01/30/2020  Medication Sig Note  . acetaminophen (TYLENOL) 500 MG tablet Take 1 tablet (500 mg total) by mouth every 6 (six) hours as needed for moderate pain.   Marland Kitchen atorvastatin (LIPITOR) 80 MG tablet Take 1 tablet (80 mg total) by mouth daily at 6 PM. (Patient taking differently: Take 80 mg by mouth at bedtime. )   . Cholecalciferol (VITAMIN D3) 50 MCG (2000 UT) TABS Take 2,000 Units by mouth in the morning.   . cloNIDine (CATAPRES) 0.1 MG tablet Take 0.1 mg by mouth as needed.    . DULoxetine (CYMBALTA) 20 MG capsule Take 1 capsule (20 mg total) by mouth daily. 07/13/2019: Makes patient feel "restless and scared" and unable to sleep  . glucose blood (ONE TOUCH ULTRA TEST) test strip USE TO check blood sugars twice daily   . hydrALAZINE (APRESOLINE) 50 MG tablet Take 50 mg by mouth 3 (three) times daily.   . Insulin Pen Needle 31G X 8 MM MISC Use once daily with lantus solostar   . LANTUS SOLOSTAR 100 UNIT/ML Solostar Pen INJECT 50 UNITS ONCE DAILY AT 10PM (Patient taking differently: Inject 10 Units into the skin at bedtime. )    . Magnesium 250 MG TABS Take 250 mg by mouth daily.   . metFORMIN (GLUCOPHAGE) 500 MG tablet TAKE (1) TABLET DAILY WITH BREAKFAST. (Patient taking differently: Take 500 mg by mouth daily with breakfast. )   . nitrofurantoin, macrocrystal-monohydrate, (MACROBID) 100 MG capsule Take 100 mg by mouth daily.   Marland Kitchen NOVOLOG FLEXPEN 100 UNIT/ML FlexPen Sliding scales   . omeprazole (PRILOSEC) 20 MG capsule TAKE  (1)  CAPSULE  TWICE DAILY (TAKE ON AN EMPTY STOMACH AT LEAST 30 MIN- UTES BEFORE MEALS). (Patient taking differently: Take 20 mg by mouth 2 (two) times daily before a meal. )   . ondansetron (ZOFRAN) 4 MG tablet TAKE 1 TABLET EVERY 8 HOURS AS NEEDED FOR NAUSEA & VOMITING (Patient taking differently: Take 4 mg by mouth every 8 (eight) hours as needed for nausea or vomiting. )   . potassium chloride SA (KLOR-CON) 20 MEQ tablet Take 20 mEq by mouth daily.   Marland Kitchen rOPINIRole (REQUIP) 0.25 MG tablet Take 1 tablet (0.25 mg total) by mouth 3 (three) times daily. Take one tablet in the morning, one tablet at noon and two tablets at bedtime. (Patient taking differently: Take 0.25 mg by mouth See admin instructions. Take 0.25 mg by mouth two times a day and 0.25 mg at bedtime)   . SUPER B COMPLEX/C PO Take 1 tablet by mouth daily.   . [DISCONTINUED] Insulin Glargine (LANTUS SOLOSTAR) 100 UNIT/ML Solostar Pen INJECT 50  UNITS ONCE DAILY AT 10PM    No facility-administered encounter medications on file as of 01/30/2020.    Goals    .  Social Isolation      Current Barriers:  . Social isolation due to Covid-19 quarantine in a 86 year old patient with hypertension, Afib, CHF, DM, pulmonary fibrosis, and osteoarthritis and history of breast cancer.   Nurse Case Manager Clinical Goal(s):  Marland Kitchen Over the next 90 days, patient will continue to talk with the CCM team regularly  . Over the next 90 days, patient will continue to keep in contact with her family  Interventions:  Talked with [name], son of client,  about client adjustment to Independent Living residence at [facility] in [place], [place] Talked with [name] about pain issues of client Talked with [name] about recent fall of client Talked with [name] about ambulation needs of client (uses a walker to ambulate) Talked with [name] about appetite of client Talked with [name] about social isolation of client (client is socializing with others at [facility]) Talked with [name] about upcoming client appointments Collaborated with [facility] about nursing needs of client  Patient Self Care Activities:  . Performs ADL's independently . Performs IADL's independently  Initial goal documentation    Follow Up Plan: [name] to call client/[name], son,in next 4 weeks to assess the psychosocial needs of client at that time  [name].[name] MSW, [name] Licensed Clinical Social Worker [facility] [phone]

## 2020-02-16 ENCOUNTER — Ambulatory Visit: Payer: PPO | Admitting: *Deleted

## 2020-02-16 DIAGNOSIS — Z794 Long term (current) use of insulin: Secondary | ICD-10-CM

## 2020-02-16 DIAGNOSIS — E1140 Type 2 diabetes mellitus with diabetic neuropathy, unspecified: Secondary | ICD-10-CM

## 2020-02-16 DIAGNOSIS — E1159 Type 2 diabetes mellitus with other circulatory complications: Secondary | ICD-10-CM

## 2020-02-16 NOTE — Progress Notes (Signed)
  Chronic Care Management   Note  02/16/2020 Name: [name] MRN: [id number] DOB: Feb 15, 1926  Patient returned my call. Discussed upcoming appointment on Mon 02/20/20 at 4:00pm. She thought it was scheduled for 02/17/20. Discussed mobility concerns and use of walker or wheelchair for all ambulation. She does not drive any longer and her son takes her to appointments and anywhere else she needs to go. She sounded well and in no distress. Advised that we will be happy to add her back to the embedded CCM program when she reestablishes care on Monday.   Follow up plan: The care management team will reach out to the patient again over the next 30 days.   Chong Sicilian, BSN, RN-BC Embedded Chronic Care Manager [facility] Direct Dial: [phone]

## 2020-02-16 NOTE — Patient Instructions (Signed)
RN will reach back out to patient over the next 24 hours.   Chong Sicilian, BSN, RN-BC Embedded Chronic Care Manager [facility] [place] Direct Dial: [phone]

## 2020-02-16 NOTE — Chronic Care Management (AMB) (Signed)
  Chronic Care Management   Note  02/16/2020 Name: [name] MRN: [id number] DOB: Feb 18, 1926  Incoming call from patient and she left a voicemail for me to return her call. She is living at [facility] and has been receiving PCP services from their providers, but has an appointment on Monday to reestablish care at [facility]. She reached out to me today and left on my voicemail that she has a few questions.   I attempted to reach her at the number she called from several times, but she did not answer and her voicemail is not setup.   Follow up plan: I will reach out to Ms Munar again over the next 24 hours.   Chong Sicilian, BSN, RN-BC Embedded Chronic Care Manager [facility] [place] [facility] Direct Dial: [phone]

## 2020-02-20 ENCOUNTER — Ambulatory Visit (INDEPENDENT_AMBULATORY_CARE_PROVIDER_SITE_OTHER): Payer: PPO | Admitting: Nurse Practitioner

## 2020-02-20 ENCOUNTER — Encounter: Payer: Self-pay | Admitting: Nurse Practitioner

## 2020-02-20 ENCOUNTER — Other Ambulatory Visit: Payer: Self-pay

## 2020-02-20 VITALS — BP 167/83 | HR 65 | Temp 98.6°F | Ht 60.0 in

## 2020-02-20 DIAGNOSIS — M25552 Pain in left hip: Secondary | ICD-10-CM

## 2020-02-20 DIAGNOSIS — R110 Nausea: Secondary | ICD-10-CM

## 2020-02-20 DIAGNOSIS — E1140 Type 2 diabetes mellitus with diabetic neuropathy, unspecified: Secondary | ICD-10-CM | POA: Diagnosis not present

## 2020-02-20 DIAGNOSIS — M797 Fibromyalgia: Secondary | ICD-10-CM | POA: Diagnosis not present

## 2020-02-20 DIAGNOSIS — F339 Major depressive disorder, recurrent, unspecified: Secondary | ICD-10-CM | POA: Diagnosis not present

## 2020-02-20 DIAGNOSIS — Z7689 Persons encountering health services in other specified circumstances: Secondary | ICD-10-CM | POA: Insufficient documentation

## 2020-02-20 DIAGNOSIS — G8929 Other chronic pain: Secondary | ICD-10-CM | POA: Diagnosis not present

## 2020-02-20 DIAGNOSIS — Z794 Long term (current) use of insulin: Secondary | ICD-10-CM

## 2020-02-20 DIAGNOSIS — M25561 Pain in right knee: Secondary | ICD-10-CM | POA: Diagnosis not present

## 2020-02-20 DIAGNOSIS — I700 Atherosclerosis of aorta: Secondary | ICD-10-CM | POA: Diagnosis not present

## 2020-02-20 HISTORY — DX: Persons encountering health services in other specified circumstances: Z76.89

## 2020-02-20 MED ORDER — FREESTYLE LANCETS MISC: 2 refills | Status: DC

## 2020-02-20 MED ORDER — PEN NEEDLES 32G X 4 MM MISC: 1 refills | Status: DC

## 2020-02-20 MED ORDER — DULOXETINE HCL 20 MG PO CPEP: 20.0000 mg | ORAL_CAPSULE | Freq: Every day | ORAL | 6 refills | Status: DC

## 2020-02-20 MED ORDER — METFORMIN HCL 500 MG PO TABS: 500.0000 mg | ORAL_TABLET | Freq: Every day | ORAL | 0 refills | Status: AC

## 2020-02-20 MED ORDER — OXYMETAZOLINE HCL 0.05 % NA SOLN: 1.0000 | Freq: Two times a day (BID) | NASAL | 0 refills | Status: DC

## 2020-02-20 MED ORDER — BLOOD GLUCOSE METER KIT: PACK | 0 refills | Status: DC

## 2020-02-20 MED ORDER — TRAZODONE HCL 150 MG PO TABS: 75.0000 mg | ORAL_TABLET | Freq: Every day | ORAL | 0 refills | Status: DC

## 2020-02-20 MED ORDER — FERROUS SULFATE 325 (65 FE) MG PO TABS: 325.0000 mg | ORAL_TABLET | Freq: Every day | ORAL | 0 refills | Status: DC

## 2020-02-20 MED ORDER — ONDANSETRON HCL 4 MG PO TABS: 4.0000 mg | ORAL_TABLET | Freq: Three times a day (TID) | ORAL | 0 refills | Status: AC | PRN

## 2020-02-20 MED ORDER — CLONIDINE HCL 0.1 MG PO TABS: 0.1000 mg | ORAL_TABLET | ORAL | 1 refills | Status: DC | PRN

## 2020-02-20 MED ORDER — ATORVASTATIN CALCIUM 80 MG PO TABS: 80.0000 mg | ORAL_TABLET | Freq: Every day | ORAL | 0 refills | Status: DC

## 2020-02-20 MED ORDER — HYDRALAZINE HCL 50 MG PO TABS: 50.0000 mg | ORAL_TABLET | Freq: Three times a day (TID) | ORAL | 0 refills | Status: DC

## 2020-02-20 MED ORDER — MAGNESIUM 250 MG PO TABS: 250.0000 mg | ORAL_TABLET | Freq: Every day | ORAL | 0 refills | Status: DC

## 2020-02-20 NOTE — Patient Instructions (Addendum)
Health Maintenance After Age 84 After age 84, you are at a higher risk for certain long-term diseases and infections as well as injuries from falls. Falls are a major cause of broken bones and head injuries in people who are older than age 84. Getting regular preventive care can help to keep you healthy and well. Preventive care includes getting regular testing and making lifestyle changes as recommended by your health care provider. Talk with your health care provider about:  Which screenings and tests you should have. A screening is a test that checks for a disease when you have no symptoms.  A diet and exercise plan that is right for you. What should I know about screenings and tests to prevent falls? Screening and testing are the best ways to find a health problem early. Early diagnosis and treatment give you the best chance of managing medical conditions that are common after age 84. Certain conditions and lifestyle choices may make you more likely to have a fall. Your health care provider may recommend:  Regular vision checks. Poor vision and conditions such as cataracts can make you more likely to have a fall. If you wear glasses, make sure to get your prescription updated if your vision changes.  Medicine review. Work with your health care provider to regularly review all of the medicines you are taking, including over-the-counter medicines. Ask your health care provider about any side effects that may make you more likely to have a fall. Tell your health care provider if any medicines that you take make you feel dizzy or sleepy.  Osteoporosis screening. Osteoporosis is a condition that causes the bones to get weaker. This can make the bones weak and cause them to break more easily.  Blood pressure screening. Blood pressure changes and medicines to control blood pressure can make you feel dizzy.  Strength and balance checks. Your health care provider may recommend certain tests to check your  strength and balance while standing, walking, or changing positions.  Foot health exam. Foot pain and numbness, as well as not wearing proper footwear, can make you more likely to have a fall.  Depression screening. You may be more likely to have a fall if you have a fear of falling, feel emotionally low, or feel unable to do activities that you used to do.  Alcohol use screening. Using too much alcohol can affect your balance and may make you more likely to have a fall. What actions can I take to lower my risk of falls? General instructions  Talk with your health care provider about your risks for falling. Tell your health care provider if: ? You fall. Be sure to tell your health care provider about all falls, even ones that seem minor. ? You feel dizzy, sleepy, or off-balance.  Take over-the-counter and prescription medicines only as told by your health care provider. These include any supplements.  Eat a healthy diet and maintain a healthy weight. A healthy diet includes low-fat dairy products, low-fat (lean) meats, and fiber from whole grains, beans, and lots of fruits and vegetables. Home safety  Remove any tripping hazards, such as rugs, cords, and clutter.  Install safety equipment such as grab bars in bathrooms and safety rails on stairs.  Keep rooms and walkways well-lit. Activity   Follow a regular exercise program to stay fit. This will help you maintain your balance. Ask your health care provider what types of exercise are appropriate for you.  If you need a cane or   walker, use it as recommended by your health care provider.  Wear supportive shoes that have nonskid soles. Lifestyle  Do not drink alcohol if your health care provider tells you not to drink.  If you drink alcohol, limit how much you have: ? 0-1 drink a day for women. ? 0-2 drinks a day for men.  Be aware of how much alcohol is in your drink. In the [place] one drink equals one typical bottle of beer (12  oz), one-half glass of wine (5 oz), or one shot of hard liquor (1 oz).  Do not use any products that contain nicotine or tobacco, such as cigarettes and e-cigarettes. If you need help quitting, ask your health care provider. Summary  Having a healthy lifestyle and getting preventive care can help to protect your health and wellness after age 33.  Screening and testing are the best way to find a health problem early and help you avoid having a fall. Early diagnosis and treatment give you the best chance for managing medical conditions that are more common for people who are older than age 12.  Falls are a major cause of broken bones and head injuries in people who are older than age 30. Take precautions to prevent a fall at home.  Work with your health care provider to learn what changes you can make to improve your health and wellness and to prevent falls. This information is not intended to replace advice given to you by your health care provider. Make sure you discuss any questions you have with your health care provider. Document Revised: 08/12/2018 Document Reviewed: 03/04/2017 Elsevier Patient Education  2020 [place]. Diabetes Basics  Diabetes (diabetes mellitus) is a long-term (chronic) disease. It occurs when the body does not properly use sugar (glucose) that is released from food after you eat. Diabetes may be caused by one or both of these problems:  Your pancreas does not make enough of a hormone called insulin.  Your body does not react in a normal way to insulin that it makes. Insulin lets sugars (glucose) go into cells in your body. This gives you energy. If you have diabetes, sugars cannot get into cells. This causes high blood sugar (hyperglycemia). Follow these instructions at home: How is diabetes treated? You may need to take insulin or other diabetes medicines daily to keep your blood sugar in balance. Take your diabetes medicines every day as told by your doctor.  List your diabetes medicines here: Diabetes medicines  Name of medicine: ______________________________ ? Amount (dose): _______________ Time (a.m./p.m.): _______________ Notes: ___________________________________  Name of medicine: ______________________________ ? Amount (dose): _______________ Time (a.m./p.m.): _______________ Notes: ___________________________________  Name of medicine: ______________________________ ? Amount (dose): _______________ Time (a.m./p.m.): _______________ Notes: ___________________________________ If you use insulin, you will learn how to give yourself insulin by injection. You may need to adjust the amount based on the food that you eat. List the types of insulin you use here: Insulin  Insulin type: ______________________________ ? Amount (dose): _______________ Time (a.m./p.m.): _______________ Notes: ___________________________________  Insulin type: ______________________________ ? Amount (dose): _______________ Time (a.m./p.m.): _______________ Notes: ___________________________________  Insulin type: ______________________________ ? Amount (dose): _______________ Time (a.m./p.m.): _______________ Notes: ___________________________________  Insulin type: ______________________________ ? Amount (dose): _______________ Time (a.m./p.m.): _______________ Notes: ___________________________________  Insulin type: ______________________________ ? Amount (dose): _______________ Time (a.m./p.m.): _______________ Notes: ___________________________________ How do I manage my blood sugar?  Check your blood sugar levels using a blood glucose monitor as directed by your doctor. Your doctor will set treatment goals  for you. Generally, you should have these blood sugar levels:  Before meals (preprandial): 80-130 mg/dL (4.4-7.2 mmol/L).  After meals (postprandial): below 180 mg/dL (10 mmol/L).  A1c level: less than 7%. Write down the times that you will check  your blood sugar levels: Blood sugar checks  Time: _______________ Notes: ___________________________________  Time: _______________ Notes: ___________________________________  Time: _______________ Notes: ___________________________________  Time: _______________ Notes: ___________________________________  Time: _______________ Notes: ___________________________________  Time: _______________ Notes: ___________________________________  What do I need to know about low blood sugar? Low blood sugar is called hypoglycemia. This is when blood sugar is at or below 70 mg/dL (3.9 mmol/L). Symptoms may include:  Feeling: ? Hungry. ? Worried or nervous (anxious). ? Sweaty and clammy. ? Confused. ? Dizzy. ? Sleepy. ? Sick to your stomach (nauseous).  Having: ? A fast heartbeat. ? A headache. ? A change in your vision. ? Tingling or no feeling (numbness) around the mouth, lips, or tongue. ? Jerky movements that you cannot control (seizure).  Having trouble with: ? Moving (coordination). ? Sleeping. ? Passing out (fainting). ? Getting upset easily (irritability). Treating low blood sugar To treat low blood sugar, eat or drink something sugary right away. If you can think clearly and swallow safely, follow the 15:15 rule:  Take 15 grams of a fast-acting carb (carbohydrate). Talk with your doctor about how much you should take.  Some fast-acting carbs are: ? Sugar tablets (glucose pills). Take 3-4 glucose pills. ? 6-8 pieces of hard candy. ? 4-6 oz (120-150 mL) of fruit juice. ? 4-6 oz (120-150 mL) of regular (not diet) soda. ? 1 Tbsp (15 mL) honey or sugar.  Check your blood sugar 15 minutes after you take the carb.  If your blood sugar is still at or below 70 mg/dL (3.9 mmol/L), take 15 grams of a carb again.  If your blood sugar does not go above 70 mg/dL (3.9 mmol/L) after 3 tries, get help right away.  After your blood sugar goes back to normal, eat a meal or a snack  within 1 hour. Treating very low blood sugar If your blood sugar is at or below 54 mg/dL (3 mmol/L), you have very low blood sugar (severe hypoglycemia). This is an emergency. Do not wait to see if the symptoms will go away. Get medical help right away. Call your local emergency services (911 in the [place] Do not drive yourself to the hospital. Questions to ask your health care provider  Do I need to meet with a diabetes educator?  What equipment will I need to care for myself at home?  What diabetes medicines do I need? When should I take them?  How often do I need to check my blood sugar?  What number can I call if I have questions?  When is my next doctor's visit?  Where can I find a support group for people with diabetes? Where to find more information  American Diabetes Association: www.diabetes.org  American Association of Diabetes Educators: www.diabeteseducator.org/patient-resources Contact a doctor if:  Your blood sugar is at or above 240 mg/dL (13.3 mmol/L) for 2 days in a row.  You have been sick or have had a fever for 2 days or more, and you are not getting better.  You have any of these problems for more than 6 hours: ? You cannot eat or drink. ? You feel sick to your stomach (nauseous). ? You throw up (vomit). ? You have watery poop (diarrhea). Get help right away if:  Your blood sugar is lower than 54 mg/dL (3 mmol/L).  You get confused.  You have trouble: ? Thinking clearly. ? Breathing. Summary  Diabetes (diabetes mellitus) is a long-term (chronic) disease. It occurs when the body does not properly use sugar (glucose) that is released from food after digestion.  Take insulin and diabetes medicines as told.  Check your blood sugar every day, as often as told.  Keep all follow-up visits as told by your doctor. This is important. This information is not intended to replace advice given to you by your health care provider. Make sure you discuss any  questions you have with your health care provider. Document Revised: 01/12/2019 Document Reviewed: 07/24/2017 Elsevier Patient Education  [place].

## 2020-02-20 NOTE — Assessment & Plan Note (Signed)
Patient is a 86 year old female who presents to clinic to establish care.  Patient has a history of arthritis A. fib, cataract.  Diabetes, hyperlipidemia, hypertension and frequent UTIs.  Completed head to toe assessment, provided education to patient with printed handouts given.  Preventative and health maintenance education completed.  Vaccines provided to patient.  Patient is unable to stand at this point, use a wheelchair. Head to toe assessment completed.  Patient has no new concerns.  Medication reconciliation completed. Labs completed today lipid panel, microalbumin, hemoglobin A1c.  Labs pending Rx refill sent to pharmacy.  Patient follow-up on next visit.

## 2020-02-20 NOTE — Progress Notes (Signed)
New Patient Office Visit  Subjective:  Patient ID: [name], female    DOB: Jul 22, 1925  Age: 86 y.o. MRN: [id number]  CC:  Chief Complaint  Patient presents with  . Establish Care    HPI [name] presents for .   Encounter for general adult medical examination without abnormal findings  Physical: Patient's last physical exam was 1 year ago .  Weight: Appropriate for height (BMI less than 27%) ;  Blood Pressure: Abnormal (BP greater than than 120/80) ;167/83  Medical History: Patient history reviewed ; Family history reviewed ;  Allergies Reviewed: No change in current allergies ;  Medications Reviewed: Medications reviewed - no changes ;  Lipids: Normal lipid levels ; labs completed. Smoking: Life-long non-smoker ;  Physical Activity: patient currently in a wheel chair;  Alcohol/Drug Use: Is a non-drinker ; No illicit drug use ;  Patient is not afflicted from Stress Incontinence and Urge Incontinence  Safety: reviewed ; Patient wears a seat belt, has smoke detectors, has carbon monoxide detectors, and wears sunscreen with extended sun exposure. ( patient reports not been in the sun as often) Dental Care: annual cleanings, brushes daily. Ophthalmology/Optometry: Annual visit.  Hearing loss: none Vision impairments: none   Past Medical History:  Diagnosis Date  . Arthritis   . Atrial fibrillation ([place])   . Cancer ([place])    breast  . Cataract   . Diabetes mellitus without complication ([place])   . Frequent UTI   . Hyperlipidemia   . Hypertension   . Neck pain   . Scoliosis   . Stroke [name])     Past Surgical History:  Procedure Laterality Date  . APPENDECTOMY    . BREAST LUMPECTOMY Left   . CHOLECYSTECTOMY    . JOINT REPLACEMENT     bilat knee   . REPLACEMENT TOTAL KNEE BILATERAL Bilateral   . TONSILLECTOMY      Family History  Problem Relation Age of Onset  . Cancer Mother        lung  . Arthritis Mother   . Heart disease Father         endocarditis  . Cancer Brother        lung, throat  . Alcohol abuse Brother     Social History   Socioeconomic History  . Marital status: Widowed    Spouse name: Not on file  . Number of children: 2  . Years of education: 5  . Highest education level: Some college, no degree  Occupational History  . Occupation: Retired    Fish farm manager: [employer]    Comment: Network engineer  Tobacco Use  . Smoking status: Never Smoker  . Smokeless tobacco: Never Used  Vaping Use  . Vaping Use: Never used  Substance and Sexual Activity  . Alcohol use: No    Alcohol/week: 0.0 standard drinks  . Drug use: No  . Sexual activity: Not Currently  Other Topics Concern  . Not on file  Social History Narrative   Lives alone in a one story home.  Husband passed away. She has two sons 2 sons but.  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  Education: specialty courses with [employer], high school education.      ROS Review of Systems  Musculoskeletal: Positive for arthralgias and back pain.       Patient uses a wheelchair  All other systems reviewed and are negative.   Objective:   Today's Vitals: BP (!) 167/83   Pulse 65  Temp 98.6 F (37 C) (Temporal)   Ht 5' (1.524 m)   SpO2 94%   BMI 27.15 kg/m   Physical Exam Vitals reviewed. Exam conducted with a chaperone present (Patient's son).  Constitutional:      Appearance: Normal appearance.  HENT:     Head: Normocephalic.     Nose: Nose normal.     Mouth/Throat:     Mouth: Mucous membranes are dry.  Eyes:     Conjunctiva/sclera: Conjunctivae normal.  Cardiovascular:     Pulses: Normal pulses.  Pulmonary:     Effort: Pulmonary effort is normal.     Breath sounds: Normal breath sounds.  Abdominal:     General: Bowel sounds are normal.  Musculoskeletal:        General: Tenderness present. Normal range of motion.     Comments: Patient uses a wheelchair  Skin:    General: Skin is dry.     Coloration: Skin is pale.  Neurological:      Mental Status: She is alert and oriented to person, place, and time.  Psychiatric:        Mood and Affect: Mood normal.        Behavior: Behavior normal.     Assessment & Plan:  Encounter to establish care Patient is a 86 year old female who presents to [name] to establish care.  Patient has a history of arthritis A. fib, cataract.  Diabetes, hyperlipidemia, hypertension and frequent UTIs.  Completed head to toe assessment, provided education to patient with printed handouts given.  Preventative and health maintenance education completed.  Vaccines provided to patient.  Patient is unable to stand at this point, use a wheelchair. Head to toe assessment completed.  Patient has no new concerns.  Medication reconciliation completed. Labs completed today lipid panel, microalbumin, hemoglobin A1c.  Labs pending Rx refill sent to pharmacy.  Patient follow-up on next visit.  Problem List Items Addressed This Visit      Cardiovascular and Mediastinum   Aortic atherosclerosis (HCC)   Relevant Medications   atorvastatin (LIPITOR) 80 MG tablet   cloNIDine (CATAPRES) 0.1 MG tablet   hydrALAZINE (APRESOLINE) 50 MG tablet   Other Relevant Orders   Lipid Panel     Endocrine   Type 2 diabetes mellitus with diabetic neuropathy, unspecified (HCC) - Primary   Relevant Medications   Lancets (FREESTYLE) lancets   atorvastatin (LIPITOR) 80 MG tablet   metFORMIN (GLUCOPHAGE) 500 MG tablet   Other Relevant Orders   Hemoglobin A1c   Microalbumin / creatinine urine ratio     Other   Fibromyalgia   Relevant Medications   traMADol (ULTRAM) 50 MG tablet   traZODone (DESYREL) 150 MG tablet   DULoxetine (CYMBALTA) 20 MG capsule   Chronic pain of right knee   Relevant Medications   DULoxetine (CYMBALTA) 20 MG capsule   Chronic left hip pain   Relevant Medications   traMADol (ULTRAM) 50 MG tablet   traZODone (DESYREL) 150 MG tablet   DULoxetine (CYMBALTA) 20 MG capsule   Depression, recurrent (HCC)     Relevant Medications   traZODone (DESYREL) 150 MG tablet   DULoxetine (CYMBALTA) 20 MG capsule    Other Visit Diagnoses    Nausea       Relevant Medications   ondansetron (ZOFRAN) 4 MG tablet   Encounter to establish care       Relevant Medications   Magnesium 250 MG TABS      Outpatient Encounter Medications as of 02/20/2020  Medication  Sig  . acetaminophen (TYLENOL) 500 MG tablet Take 1 tablet (500 mg total) by mouth every 6 (six) hours as needed for moderate pain.  Marland Kitchen atorvastatin (LIPITOR) 80 MG tablet Take 1 tablet (80 mg total) by mouth at bedtime.  . Cholecalciferol (VITAMIN D3) 50 MCG (2000 UT) TABS Take 2,000 Units by mouth in the morning.  . cloNIDine (CATAPRES) 0.1 MG tablet Take 1 tablet (0.1 mg total) by mouth as needed.  . ferrous sulfate 325 (65 FE) MG tablet Take 1 tablet (325 mg total) by mouth daily with breakfast.  . glucose blood (ONE TOUCH ULTRA TEST) test strip USE TO check blood sugars twice daily  . hydrALAZINE (APRESOLINE) 50 MG tablet Take 1 tablet (50 mg total) by mouth 3 (three) times daily.  . Insulin Pen Needle 31G X 8 MM MISC Use once daily with lantus solostar  . LANTUS SOLOSTAR 100 UNIT/ML Solostar Pen INJECT 50 UNITS ONCE DAILY AT 10PM (Patient taking differently: Inject 10 Units into the skin at bedtime. )  . Magnesium 250 MG TABS Take 1 tablet (250 mg total) by mouth daily.  . metFORMIN (GLUCOPHAGE) 500 MG tablet Take 1 tablet (500 mg total) by mouth daily with breakfast.  . omeprazole (PRILOSEC) 20 MG capsule TAKE  (1)  CAPSULE  TWICE DAILY (TAKE ON AN EMPTY STOMACH AT LEAST 30 MIN- UTES BEFORE MEALS). (Patient taking differently: Take 20 mg by mouth 2 (two) times daily before a meal. )  . ondansetron (ZOFRAN) 4 MG tablet Take 1 tablet (4 mg total) by mouth every 8 (eight) hours as needed for nausea or vomiting.  Marland Kitchen oxymetazoline (AFRIN) 0.05 % nasal spray Place 1 spray into both nostrils 2 (two) times daily.  . potassium chloride SA (KLOR-CON) 20  MEQ tablet Take 20 mEq by mouth daily.  . SUPER B COMPLEX/C PO Take 1 tablet by mouth daily.  . traMADol (ULTRAM) 50 MG tablet Take 50 mg by mouth 2 (two) times daily as needed.  . traZODone (DESYREL) 150 MG tablet Take 0.5 tablets (75 mg total) by mouth at bedtime.  . [DISCONTINUED] atorvastatin (LIPITOR) 80 MG tablet Take 1 tablet (80 mg total) by mouth daily at 6 PM. (Patient taking differently: Take 80 mg by mouth at bedtime. )  . [DISCONTINUED] cloNIDine (CATAPRES) 0.1 MG tablet Take 0.1 mg by mouth as needed.   . [DISCONTINUED] ferrous sulfate 325 (65 FE) MG tablet Take 325 mg by mouth daily with breakfast.  . [DISCONTINUED] hydrALAZINE (APRESOLINE) 50 MG tablet Take 50 mg by mouth 3 (three) times daily.  . [DISCONTINUED] Magnesium 250 MG TABS Take 250 mg by mouth daily.  . [DISCONTINUED] metFORMIN (GLUCOPHAGE) 500 MG tablet TAKE (1) TABLET DAILY WITH BREAKFAST. (Patient taking differently: Take 500 mg by mouth daily with breakfast. )  . [DISCONTINUED] nitrofurantoin, macrocrystal-monohydrate, (MACROBID) 100 MG capsule Take 100 mg by mouth daily.  . [DISCONTINUED] ondansetron (ZOFRAN) 4 MG tablet TAKE 1 TABLET EVERY 8 HOURS AS NEEDED FOR NAUSEA & VOMITING (Patient taking differently: Take 4 mg by mouth every 8 (eight) hours as needed for nausea or vomiting. )  . [DISCONTINUED] oxymetazoline (AFRIN) 0.05 % nasal spray Place 1 spray into both nostrils 2 (two) times daily.  . [DISCONTINUED] traZODone (DESYREL) 150 MG tablet Take by mouth.  . blood glucose meter kit and supplies Dispense based on patient and insurance preference. Use up to four times daily as directed. (FOR ICD-10 E10.9, E11.9).accucheck  . DULoxetine (CYMBALTA) 20 MG capsule Take 1 capsule (  20 mg total) by mouth daily.  . Insulin Pen Needle (PEN NEEDLES) 32G X 4 MM MISC Patient test bid  . Lancets (FREESTYLE) lancets Use as instructed  . NOVOLOG FLEXPEN 100 UNIT/ML FlexPen Sliding scales (Patient not taking: Reported on  02/20/2020)  . rOPINIRole (REQUIP) 0.25 MG tablet Take 1 tablet (0.25 mg total) by mouth 3 (three) times daily. Take one tablet in the morning, one tablet at noon and two tablets at bedtime. (Patient taking differently: Take 0.25 mg by mouth See admin instructions. Take 0.25 mg by mouth two times a day and 0.25 mg at bedtime)  . [DISCONTINUED] DULoxetine (CYMBALTA) 20 MG capsule Take 1 capsule (20 mg total) by mouth daily.  . [DISCONTINUED] Insulin Glargine (LANTUS SOLOSTAR) 100 UNIT/ML Solostar Pen INJECT 50 UNITS ONCE DAILY AT 10PM   No facility-administered encounter medications on file as of 02/20/2020.    Follow-up: Return if symptoms worsen or fail to improve.   Ivy Lynn, NP

## 2020-02-21 ENCOUNTER — Telehealth: Payer: Self-pay

## 2020-02-21 ENCOUNTER — Other Ambulatory Visit: Payer: Self-pay | Admitting: *Deleted

## 2020-02-21 MED ORDER — ATORVASTATIN CALCIUM 80 MG PO TABS
80.0000 mg | ORAL_TABLET | Freq: Every day | ORAL | 1 refills | Status: DC
Start: 2020-02-21 — End: 2022-08-01

## 2020-02-21 MED ORDER — CLONIDINE HCL 0.1 MG PO TABS
0.1000 mg | ORAL_TABLET | Freq: Two times a day (BID) | ORAL | 3 refills | Status: DC | PRN
Start: 2020-02-21 — End: 2022-04-04

## 2020-02-21 NOTE — Telephone Encounter (Signed)
Pharmacy aware- since medication is PRN they need to know when should the patient take it ? If BP is above what number ?

## 2020-02-21 NOTE — Telephone Encounter (Signed)
Clonidine 0.1 po PRN T96 hrs for systolic blood pressure > 180

## 2020-02-21 NOTE — Telephone Encounter (Signed)
Clonidine instructions states to use prn- please advise if this is correct Also, lancets where called in but no device.  Pharmacy states that patient told her to just self prick with the lancets.  Please advise if this is correct.

## 2020-02-21 NOTE — Telephone Encounter (Signed)
Please send to [facility] when you are done so we can contact pharmacy. Thank you

## 2020-02-21 NOTE — Telephone Encounter (Signed)
[facility]

## 2020-02-21 NOTE — Telephone Encounter (Signed)
Clonidine 0.1 mg: order was a refill, order says to take 0.1 mg oral every 12 hours PRN, Lancets where ordered based on the refill needs, if they need a device that can be sent in. Patient already has a device.Thanks

## 2020-02-21 NOTE — Telephone Encounter (Signed)
I will have to go back and check ordering physicians parameters and make some phone calls, . If patient was already on medication they should have had parameters of BP . I will get back when I get all the information.

## 2020-02-24 ENCOUNTER — Telehealth: Payer: Self-pay

## 2020-02-24 ENCOUNTER — Other Ambulatory Visit: Payer: PPO

## 2020-02-24 ENCOUNTER — Ambulatory Visit: Payer: PPO | Admitting: *Deleted

## 2020-02-24 ENCOUNTER — Other Ambulatory Visit: Payer: Self-pay

## 2020-02-24 DIAGNOSIS — Z794 Long term (current) use of insulin: Secondary | ICD-10-CM

## 2020-02-24 DIAGNOSIS — E1140 Type 2 diabetes mellitus with diabetic neuropathy, unspecified: Secondary | ICD-10-CM

## 2020-02-24 DIAGNOSIS — R8290 Unspecified abnormal findings in urine: Secondary | ICD-10-CM | POA: Diagnosis not present

## 2020-02-24 DIAGNOSIS — I700 Atherosclerosis of aorta: Secondary | ICD-10-CM | POA: Diagnosis not present

## 2020-02-24 DIAGNOSIS — R5381 Other malaise: Secondary | ICD-10-CM

## 2020-02-24 LAB — MICROSCOPIC EXAMINATION
Cast Type: NONE SEEN
Casts: NONE SEEN /lpf
Crystal Type: NONE SEEN
Crystals: NONE SEEN
Trichomonas, UA: NONE SEEN
WBC, UA: >30 /hpf — AB (ref 0–5)
Yeast, UA: NONE SEEN

## 2020-02-24 LAB — URINALYSIS, COMPLETE
Bilirubin, UA: NEGATIVE
Glucose, UA: NEGATIVE
Ketones, UA: NEGATIVE
Nitrite, UA: POSITIVE — AB
Specific Gravity, UA: 1.02 (ref 1.005–1.030)
Urobilinogen, Ur: 0.2 mg/dL (ref 0.2–1.0)
pH, UA: 5.5 (ref 5.0–7.5)

## 2020-02-24 NOTE — Chronic Care Management (AMB) (Signed)
  Chronic Care Management   Note  02/24/2020 Name: [name] MRN: [id number] DOB: 26-Feb-1926  Returned patient's call regarding recent appointment and lab results. Questions answered. Advised that she should be receiving a call from [facility] clinical staff regarding urinalysis results and treatment course once her PCP reviews them. Updated patient contact information and removed [facility] from her list since she is using [facility] for delivery to her independent living facility.   Follow up plan: Encouraged patient to reach out to PCP as needed Encouraged patient to reach out to [facility] as needed CCM team will f/u with patient by telephone over the next 30 days  Chong Sicilian, BSN, RN-BC [facility] [facility] Direct Dial: [phone]

## 2020-02-24 NOTE — Patient Instructions (Signed)
  Follow up plan: Encouraged patient to reach out to PCP as needed Encouraged patient to reach out to [place] as needed CCM team will f/u with patient by telephone over the next 30 days  Chong Sicilian, BSN, RN-BC [place] [facility] Direct Dial: [phone]

## 2020-02-24 NOTE — Telephone Encounter (Signed)
Please review UA results from today and advise.

## 2020-02-25 ENCOUNTER — Other Ambulatory Visit (INDEPENDENT_AMBULATORY_CARE_PROVIDER_SITE_OTHER): Payer: PPO | Admitting: Nurse Practitioner

## 2020-02-25 LAB — LIPID PANEL
Chol/HDL Ratio: 2.5 ratio (ref 0.0–4.4)
Cholesterol, Total: 115 mg/dL (ref 100–199)
HDL: 46 mg/dL (ref >39)
LDL Chol Calc (NIH): 54 mg/dL (ref 0–99)
Triglycerides: 71 mg/dL (ref 0–149)
VLDL Cholesterol Cal: 15 mg/dL (ref 5–40)

## 2020-02-25 LAB — HEMOGLOBIN A1C
Est. average glucose Bld gHb Est-mCnc: 140 mg/dL
Hgb A1c MFr Bld: 6.5 % — ABNORMAL HIGH (ref 4.8–5.6)

## 2020-02-25 LAB — MICROALBUMIN / CREATININE URINE RATIO
Creatinine, Urine: 35 mg/dL
Microalb/Creat Ratio: 373 mg/g creat — ABNORMAL HIGH (ref 0–29)
Microalbumin, Urine: 130.7 ug/mL

## 2020-02-25 MED ORDER — CEPHALEXIN 500 MG PO CAPS: 500.0000 mg | ORAL_CAPSULE | Freq: Two times a day (BID) | ORAL | 0 refills | Status: DC

## 2020-02-25 NOTE — Telephone Encounter (Signed)
Keflex 500 mg sent to preferred pharmacy, [facility])  Pending urine cultures.

## 2020-02-27 NOTE — Telephone Encounter (Signed)
Left message to call back  

## 2020-02-28 ENCOUNTER — Encounter: Payer: Self-pay | Admitting: Family Medicine

## 2020-02-28 ENCOUNTER — Ambulatory Visit (INDEPENDENT_AMBULATORY_CARE_PROVIDER_SITE_OTHER): Payer: PPO

## 2020-02-28 ENCOUNTER — Ambulatory Visit (INDEPENDENT_AMBULATORY_CARE_PROVIDER_SITE_OTHER): Payer: PPO | Admitting: Family Medicine

## 2020-02-28 ENCOUNTER — Other Ambulatory Visit: Payer: Self-pay

## 2020-02-28 VITALS — BP 146/69 | HR 61 | Temp 97.6°F | Ht 60.0 in

## 2020-02-28 DIAGNOSIS — E1140 Type 2 diabetes mellitus with diabetic neuropathy, unspecified: Secondary | ICD-10-CM | POA: Diagnosis not present

## 2020-02-28 DIAGNOSIS — M25552 Pain in left hip: Secondary | ICD-10-CM

## 2020-02-28 DIAGNOSIS — Z794 Long term (current) use of insulin: Secondary | ICD-10-CM | POA: Diagnosis not present

## 2020-02-28 MED ORDER — GLUCOSE BLOOD VI STRP: ORAL_STRIP | 12 refills | Status: DC

## 2020-02-28 MED ORDER — MELOXICAM 7.5 MG PO TABS: 7.5000 mg | ORAL_TABLET | Freq: Every day | ORAL | 0 refills | Status: DC

## 2020-02-28 MED ORDER — TIZANIDINE HCL 4 MG PO CAPS: 4.0000 mg | ORAL_CAPSULE | Freq: Three times a day (TID) | ORAL | 0 refills | Status: DC | PRN

## 2020-02-28 MED ORDER — METHYLPREDNISOLONE ACETATE 80 MG/ML IJ SUSP
80.0000 mg | Freq: Once | INTRAMUSCULAR | Status: AC
  Administered 2020-02-28: 80 mg via INTRAMUSCULAR

## 2020-02-28 NOTE — Patient Instructions (Signed)
Hip Pain The hip is the joint between the upper legs and the lower pelvis. The bones, cartilage, tendons, and muscles of your hip joint support your body and allow you to move around. Hip pain can range from a minor ache to severe pain in one or both of your hips. The pain may be felt on the inside of the hip joint near the groin, or on the outside near the buttocks and upper thigh. You may also have swelling or stiffness in your hip area. Follow these instructions at home: Managing pain, stiffness, and swelling      If directed, put ice on the painful area. To do this: ? Put ice in a plastic bag. ? Place a towel between your skin and the bag. ? Leave the ice on for 20 minutes, 2-3 times a day.  If directed, apply heat to the affected area as often as told by your health care provider. Use the heat source that your health care provider recommends, such as a moist heat pack or a heating pad. ? Place a towel between your skin and the heat source. ? Leave the heat on for 20-30 minutes. ? Remove the heat if your skin turns bright red. This is especially important if you are unable to feel pain, heat, or cold. You may have a greater risk of getting burned. Activity  Do exercises as told by your health care provider.  Avoid activities that cause pain. General instructions   Take over-the-counter and prescription medicines only as told by your health care provider.  Keep a journal of your symptoms. Write down: ? How often you have hip pain. ? The location of your pain. ? What the pain feels like. ? What makes the pain worse.  Sleep with a pillow between your legs on your most comfortable side.  Keep all follow-up visits as told by your health care provider. This is important. Contact a health care provider if:  You cannot put weight on your leg.  Your pain or swelling continues or gets worse after one week.  It gets harder to walk.  You have a fever. Get help right away  if:  You fall.  You have a sudden increase in pain and swelling in your hip.  Your hip is red or swollen or very tender to touch. Summary  Hip pain can range from a minor ache to severe pain in one or both of your hips.  The pain may be felt on the inside of the hip joint near the groin, or on the outside near the buttocks and upper thigh.  Avoid activities that cause pain.  Write down how often you have hip pain, the location of the pain, what makes it worse, and what it feels like. This information is not intended to replace advice given to you by your health care provider. Make sure you discuss any questions you have with your health care provider. Document Revised: 09/06/2018 Document Reviewed: 09/06/2018 Elsevier Patient Education  2020 [employer] -- 

## 2020-02-28 NOTE — Progress Notes (Signed)
Subjective: CC: hip pain PCP: Joan Lynn, NP  HPI:[name] is a 86 y.o. female presenting to clinic today for:  1. Hip pain [name] fell 1 month ago while she was living in assisted living. She was not evaluated with imaging after. She did not have pain immediately after the fall. The pain in her left hip started about 2 weeks ago. The pain has gotten worse. The pain is 10/10 at times. It woke up her sobbing this morning out of her sleep. The pain feels like a stabbing pain that comes and goes with a constant ache. The pain is worse with movement. She reports muscle spasms in her hip with movement. She normally uses a walker but has had more difficulty getting around due to the pain. She has a history of OA in multiple joints but does not normally have pain in her left hip. She denies numbness or tingling in her feet. She is able to bear weight with her left leg. She denies fever, warmth, or redness. She has tried tylenol without relief. She has taken tramadol in the recent past for athrititis and reports that she does well with this. She does not currently have any tramadol. She now [place].   She needs a refill on her testing strips for her glucose meter today.   Relevant past medical, surgical, family, and social history reviewed and updated as indicated.  Allergies and medications reviewed and updated.  Allergies  Allergen Reactions  . Diltiazem Hcl Itching and Other (See Comments)    Red itchy rash started a few hours after taking short acting $RemoveBefo'120mg'rYxemBdqAey$  dilt tabs  . Ace Inhibitors Cough  . Cymbalta [Duloxetine Hcl] Anxiety and Other (See Comments)    Makes patient feel "restless and scared" and unable to sleep   Past Medical History:  Diagnosis Date  . Arthritis   . Atrial fibrillation ([place])   . Cancer ([place])    breast  . Cataract   . Diabetes mellitus without complication ([place])   . Frequent UTI   . Hyperlipidemia   . Hypertension   . Neck  pain   . Scoliosis   . Stroke [facility])     Current Outpatient Medications:  .  acetaminophen (TYLENOL) 500 MG tablet, Take 1 tablet (500 mg total) by mouth every 6 (six) hours as needed for moderate pain., Disp: 30 tablet, Rfl: 0 .  atorvastatin (LIPITOR) 80 MG tablet, Take 1 tablet (80 mg total) by mouth at bedtime., Disp: 90 tablet, Rfl: 1 .  blood glucose meter kit and supplies, Dispense based on patient and insurance preference. Use up to four times daily as directed. (FOR ICD-10 E10.9, E11.9).accucheck, Disp: 1 each, Rfl: 0 .  cephALEXin (KEFLEX) 500 MG capsule, Take 1 capsule (500 mg total) by mouth 2 (two) times daily., Disp: 14 capsule, Rfl: 0 .  Cholecalciferol (VITAMIN D3) 50 MCG (2000 UT) TABS, Take 2,000 Units by mouth in the morning., Disp: , Rfl:  .  cloNIDine (CATAPRES) 0.1 MG tablet, Take 1 tablet (0.1 mg total) by mouth 2 (two) times daily as needed (for Systolic over 045)., Disp: 60 tablet, Rfl: 3 .  DULoxetine (CYMBALTA) 20 MG capsule, Take 1 capsule (20 mg total) by mouth daily., Disp: 30 capsule, Rfl: 6 .  ferrous sulfate 325 (65 FE) MG tablet, Take 1 tablet (325 mg total) by mouth daily with breakfast., Disp: 90 tablet, Rfl: 0 .  glucose blood (ONE TOUCH ULTRA TEST) test  strip, USE TO check blood sugars twice daily, Disp: 100 each, Rfl: 12 .  hydrALAZINE (APRESOLINE) 50 MG tablet, Take 1 tablet (50 mg total) by mouth 3 (three) times daily., Disp: 90 tablet, Rfl: 0 .  Insulin Pen Needle (PEN NEEDLES) 32G X 4 MM MISC, Patient test bid, Disp: 100 each, Rfl: 1 .  Insulin Pen Needle 31G X 8 MM MISC, Use once daily with lantus solostar, Disp: 100 each, Rfl: 6 .  Lancets (FREESTYLE) lancets, Use as instructed, Disp: 100 each, Rfl: 2 .  LANTUS SOLOSTAR 100 UNIT/ML Solostar Pen, INJECT 50 UNITS ONCE DAILY AT 10PM (Patient taking differently: Inject 10 Units into the skin at bedtime. ), Disp: 15 mL, Rfl: 0 .  Magnesium 250 MG TABS, Take 1 tablet (250 mg total) by mouth daily., Disp: 90  tablet, Rfl: 0 .  metFORMIN (GLUCOPHAGE) 500 MG tablet, Take 1 tablet (500 mg total) by mouth daily with breakfast., Disp: 90 tablet, Rfl: 0 .  NOVOLOG FLEXPEN 100 UNIT/ML FlexPen, Sliding scales, Disp: , Rfl:  .  omeprazole (PRILOSEC) 20 MG capsule, TAKE  (1)  CAPSULE  TWICE DAILY (TAKE ON AN EMPTY STOMACH AT LEAST 30 MIN- UTES BEFORE MEALS). (Patient taking differently: Take 20 mg by mouth 2 (two) times daily before a meal. ), Disp: 180 capsule, Rfl: 0 .  ondansetron (ZOFRAN) 4 MG tablet, Take 1 tablet (4 mg total) by mouth every 8 (eight) hours as needed for nausea or vomiting., Disp: 90 tablet, Rfl: 0 .  oxymetazoline (AFRIN) 0.05 % nasal spray, Place 1 spray into both nostrils 2 (two) times daily., Disp: 30 mL, Rfl: 0 .  potassium chloride SA (KLOR-CON) 20 MEQ tablet, Take 20 mEq by mouth daily., Disp: , Rfl:  .  rOPINIRole (REQUIP) 0.25 MG tablet, Take 1 tablet (0.25 mg total) by mouth 3 (three) times daily. Take one tablet in the morning, one tablet at noon and two tablets at bedtime. (Patient taking differently: Take 0.25 mg by mouth See admin instructions. Take 0.25 mg by mouth two times a day and 0.25 mg at bedtime), Disp: 120 tablet, Rfl: 3 .  SUPER B COMPLEX/C PO, Take 1 tablet by mouth daily., Disp: , Rfl:  .  traMADol (ULTRAM) 50 MG tablet, Take 50 mg by mouth 2 (two) times daily as needed., Disp: , Rfl:  .  traZODone (DESYREL) 150 MG tablet, Take 0.5 tablets (75 mg total) by mouth at bedtime., Disp: 90 tablet, Rfl: 0 Social History   Socioeconomic History  . Marital status: Widowed    Spouse name: Not on file  . Number of children: 2  . Years of education: 80  . Highest education level: Some college, no degree  Occupational History  . Occupation: Retired    Fish farm manager: [employer]    Comment: Network engineer  Tobacco Use  . Smoking status: Never Smoker  . Smokeless tobacco: Never Used  Vaping Use  . Vaping Use: Never used  Substance and Sexual Activity  . Alcohol use: No     Alcohol/week: 0.0 standard drinks  . Drug use: No  . Sexual activity: Not Currently  Other Topics Concern  . Not on file  Social History Narrative   Lives alone in a one story home.  Husband passed away. She has two sons 2 sons but.  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  Education: specialty courses with [employer], high school education.   Social Determinants of Health   Financial Resource Strain:   . Difficulty  of Paying Living Expenses: Not on file  Food Insecurity:   . Worried About Charity fundraiser in the Last Year: Not on file  . Ran Out of Food in the Last Year: Not on file  Transportation Needs:   . Lack of Transportation (Medical): Not on file  . Lack of Transportation (Non-Medical): Not on file  Physical Activity:   . Days of Exercise per Week: Not on file  . Minutes of Exercise per Session: Not on file  Stress:   . Feeling of Stress : Not on file  Social Connections:   . Frequency of Communication with Friends and Family: Not on file  . Frequency of Social Gatherings with Friends and Family: Not on file  . Attends Religious Services: Not on file  . Active Member of Clubs or Organizations: Not on file  . Attends Archivist Meetings: Not on file  . Marital Status: Not on file  Intimate Partner Violence:   . Fear of Current or Ex-Partner: Not on file  . Emotionally Abused: Not on file  . Physically Abused: Not on file  . Sexually Abused: Not on file   Family History  Problem Relation Age of Onset  . Cancer Mother        lung  . Arthritis Mother   . Heart disease Father        endocarditis  . Cancer Brother        lung, throat  . Alcohol abuse Brother     Review of Systems  Per HPI.   Objective: Office vital signs reviewed. There were no vitals taken for this visit.  Physical Examination:  Physical Exam Vitals and nursing note reviewed.  Constitutional:      General: She is not in acute distress.    Appearance: Normal appearance. She is  not ill-appearing, toxic-appearing or diaphoretic.  Cardiovascular:     Rate and Rhythm: Normal rate and regular rhythm.     Heart sounds: Normal heart sounds. No murmur heard.   Pulmonary:     Effort: Pulmonary effort is normal. No respiratory distress.     Breath sounds: Normal breath sounds.  Musculoskeletal:     Right lower leg: No edema.     Left lower leg: No edema.     Comments: Significant pain with ROM of left hip. No TTP or warmth noted. Strength equal to right side.   Skin:    General: Skin is warm and dry.     Capillary Refill: Capillary refill takes less than 2 seconds.  Neurological:     General: No focal deficit present.     Mental Status: She is alert and oriented to person, place, and time.  Psychiatric:        Mood and Affect: Mood normal.        Behavior: Behavior normal.     Assessment/ Plan: Latangela was seen today for hip pain.  Diagnoses and all orders for this visit:  Acute hip pain, left No fracture or dislocation on Xray. Arthritic changes noted. Steroid IM injection today in office as her DM is well controlled with last A1C of 6.5. Kidney function is adequate for Mobic for a short duration. Tizanidine for muscle spasms. Preferred not to continue tramadol due to her age and that she currently lives alone. Return to office for new or worsening symptoms, or if symptoms persist.  -     DG Hip Unilat W OR W/O Pelvis 2-3 Views Left; Future -  methylPREDNISolone acetate (DEPO-MEDROL) injection 80 mg -     meloxicam (MOBIC) 7.5 MG tablet; Take 1 tablet (7.5 mg total) by mouth       daily. -     tiZANidine (ZANAFLEX) 4 MG capsule; Take 1 capsule (4 mg total) by mouth 3 (three) times daily as needed for muscle spasms.  Type 2 diabetes mellitus with diabetic neuropathy, with long-term current use of insulin (HCC) -     glucose blood (ONE TOUCH ULTRA TEST) test strip; USE TO check blood sugars twice daily  Return to office for new or worsening symptoms, or if  symptoms persist.   The above assessment and management plan was discussed with the patient. The patient verbalized understanding of and has agreed to the management plan. Patient is aware to call the clinic if symptoms persist or worsen. Patient is aware when to return to the clinic for a follow-up visit. Patient educated on when it is appropriate to go to the emergency department.   Marjorie Smolder, FNP-C [facility] [name] [place], [place] [address] [phone]

## 2020-02-29 LAB — URINE CULTURE

## 2020-03-02 ENCOUNTER — Telehealth: Payer: PPO

## 2020-03-15 ENCOUNTER — Other Ambulatory Visit: Payer: Self-pay

## 2020-03-15 MED ORDER — OMEPRAZOLE 20 MG PO CPDR: DELAYED_RELEASE_CAPSULE | ORAL | 0 refills | Status: DC

## 2020-03-15 NOTE — Telephone Encounter (Signed)
  Prescription Request  03/15/2020  What is the name of the medication or equipment? Omeprazole-20mg  & Tramadol-50 mg  Have you contacted your pharmacy to request a refill? (if applicable)  yes  Which pharmacy would you like this sent to? [facility]   Patient notified that their request is being sent to the clinical staff for review and that they should receive a response within 2 business days.   Je's pt.  Pt had reaction to 2 of her meds from last time--Moloxicam & Tizanidine, so do not refill these any more.  Please call pt.

## 2020-03-22 DIAGNOSIS — Z794 Long term (current) use of insulin: Secondary | ICD-10-CM | POA: Diagnosis not present

## 2020-03-22 DIAGNOSIS — Z8673 Personal history of transient ischemic attack (TIA), and cerebral infarction without residual deficits: Secondary | ICD-10-CM | POA: Diagnosis not present

## 2020-03-22 DIAGNOSIS — E785 Hyperlipidemia, unspecified: Secondary | ICD-10-CM | POA: Diagnosis not present

## 2020-03-22 DIAGNOSIS — R29810 Facial weakness: Secondary | ICD-10-CM | POA: Diagnosis not present

## 2020-03-22 DIAGNOSIS — F32A Depression, unspecified: Secondary | ICD-10-CM | POA: Diagnosis not present

## 2020-03-22 DIAGNOSIS — R531 Weakness: Secondary | ICD-10-CM | POA: Diagnosis not present

## 2020-03-22 DIAGNOSIS — N390 Urinary tract infection, site not specified: Secondary | ICD-10-CM | POA: Diagnosis not present

## 2020-03-22 DIAGNOSIS — K219 Gastro-esophageal reflux disease without esophagitis: Secondary | ICD-10-CM | POA: Diagnosis not present

## 2020-03-22 DIAGNOSIS — Z791 Long term (current) use of non-steroidal anti-inflammatories (NSAID): Secondary | ICD-10-CM | POA: Diagnosis not present

## 2020-03-22 DIAGNOSIS — I4891 Unspecified atrial fibrillation: Secondary | ICD-10-CM | POA: Diagnosis not present

## 2020-03-22 DIAGNOSIS — E119 Type 2 diabetes mellitus without complications: Secondary | ICD-10-CM | POA: Diagnosis not present

## 2020-03-22 DIAGNOSIS — I10 Essential (primary) hypertension: Secondary | ICD-10-CM | POA: Diagnosis not present

## 2020-03-22 DIAGNOSIS — R0902 Hypoxemia: Secondary | ICD-10-CM | POA: Diagnosis not present

## 2020-03-22 DIAGNOSIS — Z79899 Other long term (current) drug therapy: Secondary | ICD-10-CM | POA: Diagnosis not present

## 2020-03-23 ENCOUNTER — Ambulatory Visit: Payer: PPO | Admitting: Nurse Practitioner

## 2020-03-23 DIAGNOSIS — R531 Weakness: Secondary | ICD-10-CM | POA: Diagnosis not present

## 2020-03-23 DIAGNOSIS — R279 Unspecified lack of coordination: Secondary | ICD-10-CM | POA: Diagnosis not present

## 2020-03-23 DIAGNOSIS — I10 Essential (primary) hypertension: Secondary | ICD-10-CM | POA: Diagnosis not present

## 2020-03-23 DIAGNOSIS — Z743 Need for continuous supervision: Secondary | ICD-10-CM | POA: Diagnosis not present

## 2020-04-02 ENCOUNTER — Other Ambulatory Visit: Payer: Self-pay | Admitting: Nurse Practitioner

## 2020-04-02 DIAGNOSIS — E1142 Type 2 diabetes mellitus with diabetic polyneuropathy: Secondary | ICD-10-CM

## 2020-04-02 DIAGNOSIS — Z794 Long term (current) use of insulin: Secondary | ICD-10-CM

## 2020-04-02 NOTE — Telephone Encounter (Signed)
°  Prescription Request  04/02/2020  What is the name of the medication or equipment? Ropinirole and hydralazine   Have you contacted your pharmacy to request a refill? (if applicable) yes  Which pharmacy would you like this sent to? [facility] [place]   Patient notified that their request is being sent to the clinical staff for review and that they should receive a response within 2 business days.

## 2020-04-03 MED ORDER — HYDRALAZINE HCL 50 MG PO TABS: 50.0000 mg | ORAL_TABLET | Freq: Three times a day (TID) | ORAL | 2 refills | Status: DC

## 2020-04-03 MED ORDER — ROPINIROLE HCL 0.25 MG PO TABS: 0.2500 mg | ORAL_TABLET | Freq: Three times a day (TID) | ORAL | 2 refills | Status: DC

## 2020-04-04 ENCOUNTER — Telehealth: Payer: PPO

## 2020-04-06 ENCOUNTER — Ambulatory Visit: Payer: PPO | Admitting: Nurse Practitioner

## 2020-04-06 DIAGNOSIS — Z791 Long term (current) use of non-steroidal anti-inflammatories (NSAID): Secondary | ICD-10-CM | POA: Diagnosis not present

## 2020-04-06 DIAGNOSIS — Z7984 Long term (current) use of oral hypoglycemic drugs: Secondary | ICD-10-CM | POA: Diagnosis not present

## 2020-04-06 DIAGNOSIS — S2232XA Fracture of one rib, left side, initial encounter for closed fracture: Secondary | ICD-10-CM | POA: Diagnosis not present

## 2020-04-06 DIAGNOSIS — Z888 Allergy status to other drugs, medicaments and biological substances status: Secondary | ICD-10-CM | POA: Diagnosis not present

## 2020-04-06 DIAGNOSIS — R319 Hematuria, unspecified: Secondary | ICD-10-CM | POA: Diagnosis not present

## 2020-04-06 DIAGNOSIS — R0902 Hypoxemia: Secondary | ICD-10-CM | POA: Diagnosis not present

## 2020-04-06 DIAGNOSIS — E119 Type 2 diabetes mellitus without complications: Secondary | ICD-10-CM | POA: Diagnosis not present

## 2020-04-06 DIAGNOSIS — F32A Depression, unspecified: Secondary | ICD-10-CM | POA: Diagnosis not present

## 2020-04-06 DIAGNOSIS — K219 Gastro-esophageal reflux disease without esophagitis: Secondary | ICD-10-CM | POA: Diagnosis not present

## 2020-04-06 DIAGNOSIS — R52 Pain, unspecified: Secondary | ICD-10-CM | POA: Diagnosis not present

## 2020-04-06 DIAGNOSIS — I4891 Unspecified atrial fibrillation: Secondary | ICD-10-CM | POA: Diagnosis not present

## 2020-04-06 DIAGNOSIS — W19XXXA Unspecified fall, initial encounter: Secondary | ICD-10-CM | POA: Diagnosis not present

## 2020-04-06 DIAGNOSIS — E785 Hyperlipidemia, unspecified: Secondary | ICD-10-CM | POA: Diagnosis not present

## 2020-04-06 DIAGNOSIS — Z794 Long term (current) use of insulin: Secondary | ICD-10-CM | POA: Diagnosis not present

## 2020-04-06 DIAGNOSIS — Z79899 Other long term (current) drug therapy: Secondary | ICD-10-CM | POA: Diagnosis not present

## 2020-04-06 DIAGNOSIS — I10 Essential (primary) hypertension: Secondary | ICD-10-CM | POA: Diagnosis not present

## 2020-04-06 DIAGNOSIS — N390 Urinary tract infection, site not specified: Secondary | ICD-10-CM | POA: Diagnosis not present

## 2020-04-06 DIAGNOSIS — Z8673 Personal history of transient ischemic attack (TIA), and cerebral infarction without residual deficits: Secondary | ICD-10-CM | POA: Diagnosis not present

## 2020-04-09 ENCOUNTER — Emergency Department (HOSPITAL_COMMUNITY)
Admission: EM | Admit: 2020-04-09 | Discharge: 2020-04-11 | Disposition: A | Discharge status: Home / Self Care | Payer: PPO | Attending: Emergency Medicine | Admitting: Emergency Medicine

## 2020-04-09 ENCOUNTER — Emergency Department (HOSPITAL_COMMUNITY): Payer: PPO

## 2020-04-09 ENCOUNTER — Ambulatory Visit: Payer: Self-pay | Admitting: Licensed Clinical Social Worker

## 2020-04-09 ENCOUNTER — Telehealth: Payer: Self-pay

## 2020-04-09 ENCOUNTER — Encounter (HOSPITAL_COMMUNITY): Payer: Self-pay

## 2020-04-09 ENCOUNTER — Other Ambulatory Visit: Payer: Self-pay

## 2020-04-09 DIAGNOSIS — I5032 Chronic diastolic (congestive) heart failure: Secondary | ICD-10-CM | POA: Diagnosis not present

## 2020-04-09 DIAGNOSIS — E785 Hyperlipidemia, unspecified: Secondary | ICD-10-CM

## 2020-04-09 DIAGNOSIS — W19XXXA Unspecified fall, initial encounter: Secondary | ICD-10-CM | POA: Insufficient documentation

## 2020-04-09 DIAGNOSIS — R1010 Upper abdominal pain, unspecified: Secondary | ICD-10-CM | POA: Diagnosis not present

## 2020-04-09 DIAGNOSIS — Z79899 Other long term (current) drug therapy: Secondary | ICD-10-CM | POA: Diagnosis not present

## 2020-04-09 DIAGNOSIS — M159 Polyosteoarthritis, unspecified: Secondary | ICD-10-CM

## 2020-04-09 DIAGNOSIS — Z7984 Long term (current) use of oral hypoglycemic drugs: Secondary | ICD-10-CM | POA: Insufficient documentation

## 2020-04-09 DIAGNOSIS — Z794 Long term (current) use of insulin: Secondary | ICD-10-CM | POA: Insufficient documentation

## 2020-04-09 DIAGNOSIS — E1140 Type 2 diabetes mellitus with diabetic neuropathy, unspecified: Secondary | ICD-10-CM | POA: Insufficient documentation

## 2020-04-09 DIAGNOSIS — Z20822 Contact with and (suspected) exposure to covid-19: Secondary | ICD-10-CM | POA: Diagnosis not present

## 2020-04-09 DIAGNOSIS — Z96653 Presence of artificial knee joint, bilateral: Secondary | ICD-10-CM | POA: Diagnosis not present

## 2020-04-09 DIAGNOSIS — R1012 Left upper quadrant pain: Secondary | ICD-10-CM | POA: Diagnosis not present

## 2020-04-09 DIAGNOSIS — I110 Hypertensive heart disease with heart failure: Secondary | ICD-10-CM | POA: Insufficient documentation

## 2020-04-09 DIAGNOSIS — M8949 Other hypertrophic osteoarthropathy, multiple sites: Secondary | ICD-10-CM

## 2020-04-09 DIAGNOSIS — E1169 Type 2 diabetes mellitus with other specified complication: Secondary | ICD-10-CM

## 2020-04-09 DIAGNOSIS — R52 Pain, unspecified: Secondary | ICD-10-CM | POA: Diagnosis not present

## 2020-04-09 DIAGNOSIS — S299XXA Unspecified injury of thorax, initial encounter: Secondary | ICD-10-CM | POA: Diagnosis present

## 2020-04-09 DIAGNOSIS — E1159 Type 2 diabetes mellitus with other circulatory complications: Secondary | ICD-10-CM

## 2020-04-09 DIAGNOSIS — Z853 Personal history of malignant neoplasm of breast: Secondary | ICD-10-CM | POA: Diagnosis not present

## 2020-04-09 DIAGNOSIS — I10 Essential (primary) hypertension: Secondary | ICD-10-CM | POA: Diagnosis not present

## 2020-04-09 DIAGNOSIS — Z9012 Acquired absence of left breast and nipple: Secondary | ICD-10-CM | POA: Insufficient documentation

## 2020-04-09 DIAGNOSIS — S2242XA Multiple fractures of ribs, left side, initial encounter for closed fracture: Secondary | ICD-10-CM | POA: Diagnosis not present

## 2020-04-09 DIAGNOSIS — I152 Hypertension secondary to endocrine disorders: Secondary | ICD-10-CM

## 2020-04-09 DIAGNOSIS — M25552 Pain in left hip: Secondary | ICD-10-CM | POA: Diagnosis not present

## 2020-04-09 DIAGNOSIS — K219 Gastro-esophageal reflux disease without esophagitis: Secondary | ICD-10-CM

## 2020-04-09 DIAGNOSIS — M797 Fibromyalgia: Secondary | ICD-10-CM

## 2020-04-09 LAB — BASIC METABOLIC PANEL
Anion gap: 10 (ref 5–15)
BUN: 10 mg/dL (ref 8–23)
CO2: 28 mmol/L (ref 22–32)
Calcium: 9 mg/dL (ref 8.9–10.3)
Chloride: 98 mmol/L (ref 98–111)
Creatinine, Ser: 0.61 mg/dL (ref 0.44–1.00)
GFR, Estimated: >60 mL/min (ref >60)
Glucose, Bld: 97 mg/dL (ref 70–99)
Potassium: 3.9 mmol/L (ref 3.5–5.1)
Sodium: 136 mmol/L (ref 135–145)

## 2020-04-09 LAB — CBC WITH DIFFERENTIAL/PLATELET
Abs Immature Granulocytes: 0.02 10*3/uL (ref 0.00–0.07)
Basophils Absolute: 0 10*3/uL (ref 0.0–0.1)
Basophils Relative: 1 %
Eosinophils Absolute: 0.3 10*3/uL (ref 0.0–0.5)
Eosinophils Relative: 3 %
HCT: 48.3 % — ABNORMAL HIGH (ref 36.0–46.0)
Hemoglobin: 15 g/dL (ref 12.0–15.0)
Immature Granulocytes: 0 %
Lymphocytes Relative: 11 %
Lymphs Abs: 0.8 10*3/uL (ref 0.7–4.0)
MCH: 28.6 pg (ref 26.0–34.0)
MCHC: 31.1 g/dL (ref 30.0–36.0)
MCV: 92 fL (ref 80.0–100.0)
Monocytes Absolute: 1 10*3/uL (ref 0.1–1.0)
Monocytes Relative: 13 %
Neutro Abs: 5.5 10*3/uL (ref 1.7–7.7)
Neutrophils Relative %: 72 %
Platelets: 193 10*3/uL (ref 150–400)
RBC: 5.25 MIL/uL — ABNORMAL HIGH (ref 3.87–5.11)
RDW: 15.8 % — ABNORMAL HIGH (ref 11.5–15.5)
WBC: 7.6 10*3/uL (ref 4.0–10.5)
nRBC: 0 % (ref 0.0–0.2)

## 2020-04-09 MED ORDER — IOHEXOL 300 MG/ML  SOLN
100.0000 mL | Freq: Once | INTRAMUSCULAR | Status: AC | PRN
  Administered 2020-04-09: 75 mL via INTRAVENOUS

## 2020-04-09 MED ORDER — DULOXETINE HCL 20 MG PO CPEP
20.0000 mg | ORAL_CAPSULE | Freq: Every day | ORAL | Status: DC
  Administered 2020-04-10 – 2020-04-11 (×2): 20 mg via ORAL
  Filled 2020-04-09 (×2): qty 1

## 2020-04-09 MED ORDER — ONDANSETRON HCL 4 MG PO TABS: 4.0000 mg | ORAL_TABLET | Freq: Three times a day (TID) | ORAL | Status: DC | PRN

## 2020-04-09 MED ORDER — PANTOPRAZOLE SODIUM 40 MG PO TBEC
40.0000 mg | DELAYED_RELEASE_TABLET | Freq: Every day | ORAL | Status: DC
  Administered 2020-04-10 – 2020-04-11 (×2): 40 mg via ORAL
  Filled 2020-04-09 (×2): qty 1

## 2020-04-09 MED ORDER — HYDRALAZINE HCL 25 MG PO TABS
50.0000 mg | ORAL_TABLET | Freq: Three times a day (TID) | ORAL | Status: DC
  Administered 2020-04-09 – 2020-04-11 (×6): 50 mg via ORAL
  Filled 2020-04-09 (×6): qty 2

## 2020-04-09 MED ORDER — TRAZODONE HCL 50 MG PO TABS
75.0000 mg | ORAL_TABLET | Freq: Every day | ORAL | Status: DC
  Administered 2020-04-09 – 2020-04-10 (×2): 75 mg via ORAL
  Filled 2020-04-09 (×2): qty 2

## 2020-04-09 MED ORDER — ROPINIROLE HCL 0.25 MG PO TABS
0.2500 mg | ORAL_TABLET | Freq: Three times a day (TID) | ORAL | Status: DC
  Administered 2020-04-09 – 2020-04-11 (×6): 0.25 mg via ORAL
  Filled 2020-04-09 (×13): qty 1

## 2020-04-09 MED ORDER — CLONIDINE HCL 0.1 MG PO TABS
0.1000 mg | ORAL_TABLET | Freq: Two times a day (BID) | ORAL | Status: DC | PRN
  Filled 2020-04-09: qty 1

## 2020-04-09 MED ORDER — METFORMIN HCL 500 MG PO TABS
500.0000 mg | ORAL_TABLET | Freq: Every day | ORAL | Status: DC
  Administered 2020-04-10 – 2020-04-11 (×2): 500 mg via ORAL
  Filled 2020-04-09 (×2): qty 1

## 2020-04-09 MED ORDER — POTASSIUM CHLORIDE CRYS ER 20 MEQ PO TBCR
20.0000 meq | EXTENDED_RELEASE_TABLET | Freq: Every day | ORAL | Status: DC
  Administered 2020-04-10 – 2020-04-11 (×2): 20 meq via ORAL
  Filled 2020-04-09 (×2): qty 1

## 2020-04-09 MED ORDER — ATORVASTATIN CALCIUM 40 MG PO TABS
80.0000 mg | ORAL_TABLET | Freq: Every day | ORAL | Status: DC
  Administered 2020-04-09 – 2020-04-10 (×2): 80 mg via ORAL
  Filled 2020-04-09 (×2): qty 2

## 2020-04-09 MED ORDER — VITAMIN D 25 MCG (1000 UNIT) PO TABS
2000.0000 [IU] | ORAL_TABLET | Freq: Every day | ORAL | Status: DC
  Administered 2020-04-10 – 2020-04-11 (×2): 2000 [IU] via ORAL
  Filled 2020-04-09 (×2): qty 2

## 2020-04-09 MED ORDER — INSULIN GLARGINE 100 UNIT/ML ~~LOC~~ SOLN
10.0000 [IU] | Freq: Every day | SUBCUTANEOUS | Status: DC
  Administered 2020-04-09 – 2020-04-10 (×2): 10 [IU] via SUBCUTANEOUS
  Filled 2020-04-09 (×3): qty 0.1

## 2020-04-09 MED ORDER — HYDROCODONE-ACETAMINOPHEN 5-325 MG PO TABS
1.0000 | ORAL_TABLET | Freq: Four times a day (QID) | ORAL | Status: DC | PRN
  Administered 2020-04-10 – 2020-04-11 (×4): 1 via ORAL
  Filled 2020-04-09 (×4): qty 1

## 2020-04-09 NOTE — Telephone Encounter (Signed)
appt made for 04/10/20. Son aware

## 2020-04-09 NOTE — Patient Instructions (Addendum)
Licensed Clinical Social Worker Visit Information  Goals we discussed today:  .  Social Isolation        Current Barriers:   Social isolation in a 86 year old patient with hypertension, Afib, CHF, DM, pulmonary fibrosis, and osteoarthritis and history of breast cancer.   Nurse Case Manager Clinical Goal(s):   Over the next 90 days, patient will continue to talk with the CCM team regularly   Over the next 90 days, patient will continue to keep in contact with her family  Interventions:    [name] talked with [name], son of client about client current needs  [name] talked with [name] about recent fall of client  [name] talked with [name] about client appointment tomorrow with [facility]  [name] talked with [name] about FL-2 completion related to client current needs  Patient Self Care Activities:   Performs ADL's independently  Performs IADL's independently  Initial goal documentation    Follow Up Plan:[name] to call client/[name], son,in next 4 weeks to assess the psychosocial needs of client at that time  Materials Provided: No  The patient/[name], son of patient, verbalized understanding of instructions provided today and declined a print copy of patient instruction materials.    Norva Riffle.Pattrick Bady MSW, [name] Licensed Clinical Social Worker [facility] [phone]

## 2020-04-09 NOTE — Evaluation (Signed)
Physical Therapy Evaluation Patient Details Name: [name] MRN: [id number] DOB: 1925-08-08 Today's Date: 04/09/2020   History of Present Illness  [name] is a 86 y/o female.  [place] [facility] [place]  Brought in by [facility].  Patient unable to take care of herself in her apartment following this fall that occurred.  Patient had a fall on December 3.  Was seen at [facility].  Diagnosed with left rib fractures.  Now complains of left hip pain.  As well as left upper quadrant abdominal pain.  Some concern for possible internal injury.  She states she is having difficulty walking.  Hip x-ray here with no acute osseous abnormalities.  We decided to go ahead and get CT chest abdomen and pelvis for further evaluation.  [facility] is stating that patient needs a higher level of care they are [facility] to [facility].  But she may require admission somewhere else.    Clinical Impression  Patient demonstrates slow labored movement for sitting up at bedside with c/o severe pain left side of rib cage with movement, very unsteady on feet and limited to a few steps at bedside due to fall risk, left sided rib cage pain and fatigue.  Patient put back to bed with Mod/max assistance to reposition - RN aware.  Patient will benefit from continued physical therapy in hospital and recommended venue below to increase strength, balance, endurance for safe ADLs and gait.     Follow Up Recommendations SNF    Equipment Recommendations  None recommended by PT    Recommendations for Other Services       Precautions / Restrictions Precautions Precautions: Fall Restrictions Weight Bearing Restrictions: No      Mobility  Bed Mobility Overal bed mobility: Needs Assistance Bed Mobility: Supine to Sit;Sit to Supine     Supine to sit: Mod assist;Max assist Sit to supine: Mod assist;Max assist   General bed mobility comments:  slow labored movement with increased pain    Transfers Overall transfer level: Needs assistance Equipment used: Rolling walker (2 wheeled) Transfers: Sit to/from [facility] Sit to Stand: Mod assist Stand pivot transfers: Mod assist       General transfer comment: increased time, labored movement  Ambulation/Gait Ambulation/Gait assistance: Mod assist Gait Distance (Feet): 8 Feet Assistive device: Rolling walker (2 wheeled) Gait Pattern/deviations: Decreased step length - right;Decreased step length - left;Decreased stance time - left;Decreased stride length;Trunk flexed Gait velocity: slow   General Gait Details: slow labored movement with difficulty taking steps secondary to increased right sided rib cage pain  Stairs            Wheelchair Mobility    Modified [name] (Stroke Patients Only)       Balance Overall balance assessment: Needs assistance Sitting-balance support: Bilateral upper extremity supported;Feet supported Sitting balance-Leahy Scale: Fair Sitting balance - Comments: seated at EOB   Standing balance support: Bilateral upper extremity supported;During functional activity Standing balance-Leahy Scale: Poor Standing balance comment: fair/poor using RW                             Pertinent Vitals/Pain Pain Assessment: Faces Faces Pain Scale: Hurts whole lot Pain Location: left side rib cage, thigh Pain Descriptors / Indicators: Grimacing;Guarding;Moaning;Sharp;Sore Pain Intervention(s): Limited activity within patient's tolerance;Monitored during session;Repositioned    Home Living Family/patient expects to be discharged to:: Private residence Living Arrangements:  Alone Available Help at Discharge: Available PRN/intermittently Type of Home: Apartment Home Access: Stairs to enter Entrance Stairs-Rails: None Entrance Stairs-Number of Steps: 1 Home Layout: One level Home Equipment: Walker - 2 wheels;Cane - single  point      Prior Function Level of Independence: Needs assistance   Gait / Transfers Assistance Needed: Household ambulator using RW  ADL's / Homemaking Assistance Needed: community ADLs assisted by family        Hand Dominance        Extremity/Trunk Assessment   Upper Extremity Assessment Upper Extremity Assessment: Generalized weakness    Lower Extremity Assessment Lower Extremity Assessment: Generalized weakness    Cervical / Trunk Assessment Cervical / Trunk Assessment: Normal  Communication   Communication: No difficulties  Cognition Arousal/Alertness: Awake/alert Behavior During Therapy: WFL for tasks assessed/performed Overall Cognitive Status: Within Functional Limits for tasks assessed                                        General Comments      Exercises     Assessment/Plan    PT Assessment Patient needs continued PT services  PT Problem List Decreased strength;Decreased activity tolerance;Decreased balance;Decreased mobility;Pain       PT Treatment Interventions DME instruction;Gait training;Stair training;Functional mobility training;Therapeutic activities;Therapeutic exercise;Balance training;Patient/family education    PT Goals (Current goals can be found in the Care Plan section)  Acute Rehab PT Goals Patient Stated Goal: return home after rehab PT Goal Formulation: With patient Time For Goal Achievement: 04/23/20 Potential to Achieve Goals: Good    Frequency Min 3X/week   Barriers to discharge        Co-evaluation               AM-PAC PT "6 Clicks" Mobility  Outcome Measure Help needed turning from your back to your side while in a flat bed without using bedrails?: A Lot Help needed moving from lying on your back to sitting on the side of a flat bed without using bedrails?: A Lot Help needed moving to and from a bed to a chair (including a wheelchair)?: A Lot Help needed standing up from a chair using your  arms (e.g., wheelchair or bedside chair)?: A Lot Help needed to walk in hospital room?: A Lot Help needed climbing 3-5 steps with a railing? : Total 6 Click Score: 11    End of Session   Activity Tolerance: Patient tolerated treatment well;Patient limited by fatigue;Patient limited by pain Patient left: in bed;with call bell/phone within reach Nurse Communication: Mobility status PT Visit Diagnosis: Unsteadiness on feet (R26.81);Other abnormalities of gait and mobility (R26.89);Muscle weakness (generalized) (M62.81);History of falling (Z91.81)    Time: 7001-7494 PT Time Calculation (min) (ACUTE ONLY): 22 min   Charges:   PT Evaluation $PT Eval Moderate Complexity: 1 Mod PT Treatments $Therapeutic Activity: 8-22 mins        4:35 PM, 04/09/20 Lonell Grandchild, MPT Physical Therapist with [facility] [phone] office [phone] mobile phone

## 2020-04-09 NOTE — ED Notes (Addendum)
Spoke with Grace-anne at [facility] admissions who stated pt has a bed ready and available. Grace-anne stated they had to wait on patients insurance to "come through." States she should be able to return tomorrow.

## 2020-04-09 NOTE — Plan of Care (Signed)
  Problem: Acute Rehab PT Goals(only PT should resolve) Goal: Pt Will Go Supine/Side To Sit Flowsheets (Taken 04/09/2020 1637) Pt will go Supine/Side to Sit:  with minimal assist  with moderate assist Goal: Patient Will Transfer Sit To/From Stand Flowsheets (Taken 04/09/2020 1637) Patient will transfer sit to/from stand:  with minimal assist  with moderate assist Goal: Pt Will Transfer Bed To Chair/Chair To Bed Flowsheets (Taken 04/09/2020 1637) Pt will Transfer Bed to Chair/Chair to Bed:  with min assist  with mod assist Goal: Pt Will Ambulate Flowsheets (Taken 04/09/2020 1637) Pt will Ambulate:  25 feet  with minimal assist  with moderate assist  with rolling walker   4:37 PM, 04/09/20 Lonell Grandchild, MPT Physical Therapist with [facility] 336 [phone] office [phone] mobile phone

## 2020-04-09 NOTE — ED Provider Notes (Signed)
Medical screening examination/treatment/procedure(s) were conducted as a shared visit with non-physician practitioner(s) and myself.  I personally evaluated the patient during the encounter.     Patient seen by me along with physician [place]  Brought in by [facility].  Patient unable to take care of herself in her apartment following this fall that occurred.  Patient had a fall on December 3.  Was seen at [facility].  Diagnosed with left rib fractures.  Now complains of left hip pain.  As well as left upper quadrant abdominal pain.  Some concern for possible internal injury.  She states she is having difficulty walking.  Hip x-ray here with no acute osseous abnormalities.  We decided to go ahead and get CT chest abdomen and pelvis for further evaluation.  [facility] is stating that patient needs a higher level of care they are trying to move [facility] up within the [facility].  But she may require admission somewhere else.  So we will have our social worker get involved here once we have gotten her CT scans.  Patient nontoxic no acute distress.   Fredia Sorrow, MD 04/09/20 [phone]

## 2020-04-09 NOTE — ED Triage Notes (Signed)
Pt had a fall on 12/3 and was seen at [facility] medical center and dx with left rib fractures. Now complains of left hip pain. Pt is from [facility]

## 2020-04-09 NOTE — NC FL2 (Signed)
[place] MEDICAID FL2 LEVEL OF CARE SCREENING TOOL     IDENTIFICATION  Patient Name: [name] Birthdate: 18-Dec-1925 Sex: female Admission Date (Current Location): 04/09/2020  [facility] and [place] Number:  [employer] and Address:  [facility] [address], [place]      Provider Number: [id number]  Attending Physician Name and Address:  No att. providers found  Relative Name and Phone Number:  Tyrell Brereton (son) Ph: [phone]    Current Level of Care: Hospital Recommended Level of Care: [place] Prior Approval Number:    Date Approved/Denied:   PASRR Number:    Discharge Plan: SNF    Current Diagnoses: Patient Active Problem List   Diagnosis Date Noted  . Encounter to establish care 02/20/2020  . Bilateral subdural hematomas ([place]) 07/16/2019  . CHI (closed head injury), initial encounter 07/14/2019  . Left radial head fracture 07/14/2019  . SDH (subdural hematoma) ([place])   . Hypertensive urgency   . Subarachnoid hemorrhage following injury ([place]) 07/13/2019  . Acute encephalopathy 06/27/2019  . Heat stroke 06/27/2019  . Hypokalemia 06/27/2019  . Fever, unspecified 06/27/2019  . Stroke ([place]) 06/27/2019  . Altered mental status 06/26/2019  . Right hip pain 06/22/2019  . Chronic left hip pain 05/25/2019  . Depression, recurrent ([place]) 05/25/2019  . Chronic anticoagulation 04/19/2019  . Leg cramps 03/04/2019  . Short-term memory loss 03/04/2019  . DOE (dyspnea on exertion) 03/04/2019  . Aortic atherosclerosis ([place]) 03/04/2019  . Physical deconditioning 01/24/2019  . History of recurrent UTIs 12/03/2018  . Chronic pain of right knee 10/08/2018  . Type 2 diabetes mellitus with diabetic neuropathy, unspecified ([place]) 08/12/2018  . Pulmonary vascular congestion   . Chronic diastolic CHF (congestive heart failure) ([place])   . Acute hypoxemic respiratory failure ([place]) 12/06/2017  . Permanent atrial  fibrillation ([place]) 12/06/2017  . Trochanteric bursitis, right hip 07/16/2017  . Other intervertebral disc degeneration, lumbar region 07/16/2017  . Osteoporosis 11/04/2016  . Diabetic polyneuropathy associated with type 2 diabetes mellitus ([place]) 11/14/2014  . Fibromyalgia 02/11/2013  . CVA (cerebral vascular accident) ([place]) 08/01/2008  . Osteoarthritis 04/09/2007  . Hyperlipidemia associated with type 2 diabetes mellitus ([place]) 03/08/2007  . Hypertension associated with diabetes ([place]) 03/08/2007  . Pulmonary fibrosis ([place]) 03/08/2007  . GERD 03/08/2007  . BREAST CANCER, HX OF 03/08/2007    Orientation RESPIRATION BLADDER Height & Weight     Self, Time, Situation, Place  Normal Continent Weight: 138 lb 14.2 oz (63 kg) Height:  5' (152.4 cm)  BEHAVIORAL SYMPTOMS/MOOD NEUROLOGICAL BOWEL NUTRITION STATUS      Continent Diet (Carb modified)  AMBULATORY STATUS COMMUNICATION OF NEEDS Skin   Extensive Assist Verbally Normal                       Personal Care Assistance Level of Assistance  Bathing, Feeding, Dressing Bathing Assistance: Maximum assistance Feeding assistance: Limited assistance Dressing Assistance: Maximum assistance     Functional Limitations Info  Sight, Hearing, Speech Sight Info: Adequate Hearing Info: Adequate Speech Info: Adequate    SPECIAL CARE FACTORS FREQUENCY  PT (By licensed PT)     PT Frequency: 5x's/week              Contractures Contractures Info: Not present    Additional Factors Info  Allergies   Allergies Info: Diltiazem Hcl; Ace Inhibitors; Cymbalta (Duloxetine Hcl)           Current Medications (04/09/2020):  This is the current hospital active medication list Current Facility-Administered Medications  Medication Dose Route Frequency Provider Last Rate Last Admin  . atorvastatin (LIPITOR) tablet 80 mg  80 mg Oral QHS Idol, Almyra Free, PA-C      . cholecalciferol (VITAMIN D3) tablet 2,000 Units  2,000 Units Oral Daily Idol,  Almyra Free, PA-C      . cloNIDine (CATAPRES) tablet 0.1 mg  0.1 mg Oral BID PRN Evalee Jefferson, PA-C      . DULoxetine (CYMBALTA) DR capsule 20 mg  20 mg Oral Daily Idol, Julie, PA-C      . hydrALAZINE (APRESOLINE) tablet 50 mg  50 mg Oral TID Evalee Jefferson, PA-C      . HYDROcodone-acetaminophen (NORCO/VICODIN) 5-325 MG per tablet 1 tablet  1 tablet Oral Q6H PRN Idol, Almyra Free, PA-C      . insulin glargine (LANTUS) injection 10 Units  10 Units Subcutaneous QHS Evalee Jefferson, PA-C      . [START ON 04/10/2020] metFORMIN (GLUCOPHAGE) tablet 500 mg  500 mg Oral Q breakfast Idol, Julie, PA-C      . ondansetron (ZOFRAN) tablet 4 mg  4 mg Oral Q8H PRN Idol, Julie, PA-C      . pantoprazole (PROTONIX) EC tablet 40 mg  40 mg Oral Daily Idol, Julie, PA-C      . potassium chloride SA (KLOR-CON) CR tablet 20 mEq  20 mEq Oral Daily Idol, Julie, PA-C      . rOPINIRole (REQUIP) tablet 0.25 mg  0.25 mg Oral TID Evalee Jefferson, PA-C      . traZODone (DESYREL) tablet 75 mg  75 mg Oral QHS Evalee Jefferson, PA-C       Current Outpatient Medications  Medication Sig Dispense Refill  . acetaminophen (TYLENOL) 500 MG tablet Take 1 tablet (500 mg total) by mouth every 6 (six) hours as needed for moderate pain. 30 tablet 0  . atorvastatin (LIPITOR) 80 MG tablet Take 1 tablet (80 mg total) by mouth at bedtime. 90 tablet 1  . Cholecalciferol (VITAMIN D3) 50 MCG (2000 UT) TABS Take 2,000 Units by mouth in the morning.    . cloNIDine (CATAPRES) 0.1 MG tablet Take 1 tablet (0.1 mg total) by mouth 2 (two) times daily as needed (for Systolic over 161). 60 tablet 3  . DULoxetine (CYMBALTA) 20 MG capsule Take 1 capsule (20 mg total) by mouth daily. 30 capsule 6  . hydrALAZINE (APRESOLINE) 50 MG tablet Take 1 tablet (50 mg total) by mouth 3 (three) times daily. 90 tablet 2  . HYDROcodone-acetaminophen (NORCO/VICODIN) 5-325 MG tablet Take 1 tablet by mouth every 8 (eight) hours as needed.     Marland Kitchen LANTUS SOLOSTAR 100 UNIT/ML Solostar Pen INJECT 50 UNITS  ONCE DAILY AT 10PM (Patient taking differently: Inject 10 Units into the skin at bedtime. ) 15 mL 0  . Magnesium 250 MG TABS Take 1 tablet (250 mg total) by mouth daily. 90 tablet 0  . metFORMIN (GLUCOPHAGE) 500 MG tablet Take 1 tablet (500 mg total) by mouth daily with breakfast. 90 tablet 0  . NOVOLOG FLEXPEN 100 UNIT/ML FlexPen Inject 0-10 Units into the skin See admin instructions. Sliding scales 1 to 150=0 units, 151-200=2 units, 201-250=4 units, 251-300=6 units, 301-350=8 units, 351-400=10 units >400 call Dr < 70 call Dr    . omeprazole (PRILOSEC) 20 MG capsule TAKE  (1)  CAPSULE  TWICE DAILY (TAKE ON AN EMPTY STOMACH AT LEAST 30 MIN- UTES BEFORE MEALS). 180 capsule 0  . ondansetron (ZOFRAN) 4 MG  tablet Take 1 tablet (4 mg total) by mouth every 8 (eight) hours as needed for nausea or vomiting. 90 tablet 0  . oxymetazoline (AFRIN) 0.05 % nasal spray Place 1 spray into both nostrils 2 (two) times daily. 30 mL 0  . potassium chloride SA (KLOR-CON) 20 MEQ tablet Take 20 mEq by mouth daily.    Marland Kitchen rOPINIRole (REQUIP) 0.25 MG tablet Take 1 tablet (0.25 mg total) by mouth 3 (three) times daily. Take 0.25 mg by mouth two times a day and 0.25 mg at bedtime 90 tablet 2  . sulfamethoxazole-trimethoprim (BACTRIM DS) 800-160 MG tablet Take by mouth.    . SUPER B COMPLEX/C PO Take 1 tablet by mouth daily.    . traMADol (ULTRAM) 50 MG tablet Take 50 mg by mouth 2 (two) times daily as needed.    . traZODone (DESYREL) 150 MG tablet Take 0.5 tablets (75 mg total) by mouth at bedtime. 90 tablet 0  . blood glucose meter kit and supplies Dispense based on patient and insurance preference. Use up to four times daily as directed. (FOR ICD-10 E10.9, E11.9).accucheck 1 each 0  . cephALEXin (KEFLEX) 500 MG capsule Take 1 capsule (500 mg total) by mouth 2 (two) times daily. (Patient not taking: Reported on 04/09/2020) 14 capsule 0  . ferrous sulfate 325 (65 FE) MG tablet Take 1 tablet (325 mg total) by mouth daily with  breakfast. (Patient not taking: Reported on 04/09/2020) 90 tablet 0  . glucose blood (ONE TOUCH ULTRA TEST) test strip USE TO check blood sugars twice daily 100 each 12  . Insulin Pen Needle (PEN NEEDLES) 32G X 4 MM MISC Patient test bid 100 each 1  . Insulin Pen Needle 31G X 8 MM MISC Use once daily with lantus solostar 100 each 6  . Lancets (FREESTYLE) lancets Use as instructed 100 each 2  . meloxicam (MOBIC) 7.5 MG tablet Take 1 tablet (7.5 mg total) by mouth daily. (Patient not taking: Reported on 04/09/2020) 15 tablet 0  . tiZANidine (ZANAFLEX) 4 MG capsule Take 1 capsule (4 mg total) by mouth 3 (three) times daily as needed for muscle spasms. (Patient not taking: Reported on 04/09/2020) 30 capsule 0     Discharge Medications: Please see discharge summary for a list of discharge medications.  Relevant Imaging Results:  Relevant Lab Results:   Additional Information SSN: [id number]  Iona Beard, [place]

## 2020-04-09 NOTE — Chronic Care Management (AMB) (Signed)
Chronic Care Management    Clinical Social Work Follow Up Note  04/09/2020 Name: [name] MRN: [id number] DOB: [birth year]  [name] is a 86 y.o. year old female who is a primary care patient of Joan Lynn, NP. The CCM team was consulted for assistance with [employer] .   Review of patient status, including review of consultants reports, other relevant assessments, and collaboration with appropriate care team members and the patient's provider was performed as part of comprehensive patient evaluation and provision of chronic care management services.    SDOH (Social Determinants of Health) assessments performed: No;risk for depression;risk for tobacco use; risk for stress; risk for physical inactivity    Office Visit from 02/20/2020 in [facility]  PHQ-9 Total Score 9       GAD 7 : Generalized Anxiety Score 02/20/2020  Nervous, Anxious, on Edge 2  Control/stop worrying 0  Worry too much - different things 0  Trouble relaxing 2  Restless 1  Easily annoyed or irritable 1  Afraid - awful might happen 0  Total GAD 7 Score 6  Anxiety Difficulty Somewhat difficult    Outpatient Encounter Medications as of 04/09/2020  Medication Sig  . acetaminophen (TYLENOL) 500 MG tablet Take 1 tablet (500 mg total) by mouth every 6 (six) hours as needed for moderate pain.  Marland Kitchen atorvastatin (LIPITOR) 80 MG tablet Take 1 tablet (80 mg total) by mouth at bedtime.  . blood glucose meter kit and supplies Dispense based on patient and insurance preference. Use up to four times daily as directed. (FOR ICD-10 E10.9, E11.9).accucheck  . cephALEXin (KEFLEX) 500 MG capsule Take 1 capsule (500 mg total) by mouth 2 (two) times daily.  . Cholecalciferol (VITAMIN D3) 50 MCG (2000 UT) TABS Take 2,000 Units by mouth in the morning.  . cloNIDine (CATAPRES) 0.1 MG tablet Take 1 tablet (0.1 mg total) by mouth 2 (two) times daily as needed (for Systolic over 174).  .  DULoxetine (CYMBALTA) 20 MG capsule Take 1 capsule (20 mg total) by mouth daily.  . ferrous sulfate 325 (65 FE) MG tablet Take 1 tablet (325 mg total) by mouth daily with breakfast.  . glucose blood (ONE TOUCH ULTRA TEST) test strip USE TO check blood sugars twice daily  . hydrALAZINE (APRESOLINE) 50 MG tablet Take 1 tablet (50 mg total) by mouth 3 (three) times daily.  . Insulin Pen Needle (PEN NEEDLES) 32G X 4 MM MISC Patient test bid  . Insulin Pen Needle 31G X 8 MM MISC Use once daily with lantus solostar  . Lancets (FREESTYLE) lancets Use as instructed  . LANTUS SOLOSTAR 100 UNIT/ML Solostar Pen INJECT 50 UNITS ONCE DAILY AT 10PM (Patient taking differently: Inject 10 Units into the skin at bedtime. )  . Magnesium 250 MG TABS Take 1 tablet (250 mg total) by mouth daily.  . meloxicam (MOBIC) 7.5 MG tablet Take 1 tablet (7.5 mg total) by mouth daily.  . metFORMIN (GLUCOPHAGE) 500 MG tablet Take 1 tablet (500 mg total) by mouth daily with breakfast.  . NOVOLOG FLEXPEN 100 UNIT/ML FlexPen Sliding scales  . omeprazole (PRILOSEC) 20 MG capsule TAKE  (1)  CAPSULE  TWICE DAILY (TAKE ON AN EMPTY STOMACH AT LEAST 30 MIN- UTES BEFORE MEALS).  Marland Kitchen ondansetron (ZOFRAN) 4 MG tablet Take 1 tablet (4 mg total) by mouth every 8 (eight) hours as needed for nausea or vomiting.  Marland Kitchen oxymetazoline (AFRIN) 0.05 % nasal spray Place 1 spray into  both nostrils 2 (two) times daily.  . potassium chloride SA (KLOR-CON) 20 MEQ tablet Take 20 mEq by mouth daily.  Marland Kitchen rOPINIRole (REQUIP) 0.25 MG tablet Take 1 tablet (0.25 mg total) by mouth 3 (three) times daily. Take 0.25 mg by mouth two times a day and 0.25 mg at bedtime  . SUPER B COMPLEX/C PO Take 1 tablet by mouth daily.  Marland Kitchen tiZANidine (ZANAFLEX) 4 MG capsule Take 1 capsule (4 mg total) by mouth 3 (three) times daily as needed for muscle spasms.  . traMADol (ULTRAM) 50 MG tablet Take 50 mg by mouth 2 (two) times daily as needed.  . traZODone (DESYREL) 150 MG tablet Take 0.5  tablets (75 mg total) by mouth at bedtime.  . [DISCONTINUED] Insulin Glargine (LANTUS SOLOSTAR) 100 UNIT/ML Solostar Pen INJECT 50 UNITS ONCE DAILY AT 10PM   No facility-administered encounter medications on file as of 04/09/2020.    Goals    .  Social Isolation      Current Barriers:  . Social isolation in a 86 year old patient with hypertension, Afib, CHF, DM, pulmonary fibrosis, and osteoarthritis and history of breast cancer.   Nurse Case Manager Clinical Goal(s):  Marland Kitchen Over the next 90 days, patient will continue to talk with the CCM team regularly  . Over the next 90 days, patient will continue to keep in contact with her family  Interventions:  . [name] talked with [name], son of client about client current needs . [name] talked with [name] about recent fall of client . [name] talked with [name] about client appointment tomorrow with [facility] . [name] talked with [name] about FL-2 completion related to client current needs   Patient Self Care Activities:  . Performs ADL's independently . Performs IADL's independently   Initial goal documentation       Follow Up Plan: [name] to call client/[name], son,in next 4 weeks to assess the psychosocial needs of client at that time  Norva Riffle.[place]rest MSW, [name] Licensed Clinical Social Worker [facility] Care Management [phone]

## 2020-04-09 NOTE — TOC Initial Note (Signed)
Transition of Care [facility]) - Initial/Assessment Note   Patient Details  Name: [name] MRN: [id number] Date of Birth: March 19, 1926  Transition of [facility]) CM/SW Contact:    Sherie Don, [name] Phone Number: 04/09/2020, 1:42 PM  Clinical Narrative: Patient is a 86 year old female who presented to the ED from her independent living facility, [facility], following several falls last week. TOC received consult regarding SNF placement. CSW spoke with [facility]'s independent living director, Stanton Kidney [place], regarding the patient. Per Stanton Kidney, the facility was working on getting the patient admitted to the [facility] for rehab following her fall and requested that she be assessed for rehab.  CSW spoke with patient's son, Arminta Gamm, to discuss SNF. Son agreeable to patient going to [facility] rehab. FL2 started; PASRR pending as patient will require documentation to be uploaded. TOC awaiting PT evaluation.  Expected Discharge Plan: [place] Barriers to Discharge: Continued Medical Work up  Patient Goals and CMS Choice Patient states their goals for this hospitalization and ongoing recovery are:: Discharge to [facility] for rehab CMS Medicare.gov Compare Post Acute Care list provided to:: Patient Represenative (must comment) Choice offered to / list presented to : Adult Children  Expected Discharge Plan and Services Expected Discharge Plan: [place] In-house Referral: Clinical Social Work Discharge Planning Services: NA Post Acute Care Choice: [place] Living arrangements for the past 2 months: [place]            DME Arranged: N/A DME Agency: NA HH Arranged: NA [place] Agency: NA  Prior Living Arrangements/Services Living arrangements for the past 2 months: [place] Lives with:: Facility Resident Patient language and need for interpreter reviewed:: Yes Do you feel  safe going back to the place where you live?: Yes      Need for Family Participation in Patient Care: Yes (Comment) Care giver support system in place?: Yes (comment) Current home services: DME [facility]) Criminal Activity/Legal Involvement Pertinent to Current Situation/Hospitalization: No - Comment as needed  Permission Sought/Granted Permission sought to share information with : Facility Sport and exercise psychologist, Family Supports Permission granted to share information with : Yes, Verbal Permission Granted Permission granted to share info w AGENCY: [facility] SN[facility] Permission granted to share info w Relationship: Son  Emotional Assessment Appearance:: Appears stated age Orientation: : Oriented to Self, Oriented to Place, Oriented to  Time, Oriented to Situation Alcohol / Substance Use: Not Applicable Psych Involvement: No (comment)  Admission diagnosis:  EMS FALL 12/1 Patient Active Problem List   Diagnosis Date Noted  . Encounter to establish care 02/20/2020  . Bilateral subdural hematomas ([place]) 07/16/2019  . CHI (closed head injury), initial encounter 07/14/2019  . Left radial head fracture 07/14/2019  . SDH (subdural hematoma) ([place])   . Hypertensive urgency   . Subarachnoid hemorrhage following injury ([place]) 07/13/2019  . Acute encephalopathy 06/27/2019  . Heat stroke 06/27/2019  . Hypokalemia 06/27/2019  . Fever, unspecified 06/27/2019  . Stroke ([place]) 06/27/2019  . Altered mental status 06/26/2019  . Right hip pain 06/22/2019  . Chronic left hip pain 05/25/2019  . Depression, recurrent ([place]) 05/25/2019  . Chronic anticoagulation 04/19/2019  . Leg cramps 03/04/2019  . Short-term memory loss 03/04/2019  . DOE (dyspnea on exertion) 03/04/2019  . Aortic atherosclerosis ([place]) 03/04/2019  . Physical deconditioning 01/24/2019  . History of recurrent UTIs 12/03/2018  . Chronic pain of right knee 10/08/2018  . Type 2 diabetes mellitus with diabetic neuropathy,  unspecified  ([place]) 08/12/2018  . Pulmonary vascular congestion   . Chronic diastolic CHF (congestive heart failure) ([place])   . Acute hypoxemic respiratory failure ([place]) 12/06/2017  . Permanent atrial fibrillation ([place]) 12/06/2017  . Trochanteric bursitis, right hip 07/16/2017  . Other intervertebral disc degeneration, lumbar region 07/16/2017  . Osteoporosis 11/04/2016  . Diabetic polyneuropathy associated with type 2 diabetes mellitus ([place]) 11/14/2014  . Fibromyalgia 02/11/2013  . CVA (cerebral vascular accident) ([place]) 08/01/2008  . Osteoarthritis 04/09/2007  . Hyperlipidemia associated with type 2 diabetes mellitus ([place]) 03/08/2007  . Hypertension associated with diabetes ([place]) 03/08/2007  . Pulmonary fibrosis ([place]) 03/08/2007  . GERD 03/08/2007  . BREAST CANCER, HX OF 03/08/2007   PCP:  Ivy Lynn, NP Pharmacy:   [facility], [place] [address] Phone: [phone] Fax: [phone]  [facility] [place], [place] [place] [address] Phone: [phone] Fax: [phone]  Readmission Risk Interventions No flowsheet data found.

## 2020-04-09 NOTE — TOC Progression Note (Signed)
Transition of Care [facility]) - Progression Note    Patient Details  Name: [name] MRN: [id number] Date of Birth: 16-Mar-1926  Transition of Care [facility]) CM/SW [place], [place] Phone Number: 04/09/2020, 8:22 PM  Clinical Narrative:    CSW Fl2 completed. Fl2 note signed by Attending MD. Clinicals uploaded to PASARR for further review. TOC to follow.    Expected Discharge Plan: [place] Barriers to Discharge: Continued Medical Work up, ED Awaiting State Approval Forensic scientist)  Expected Discharge Plan and Services Expected Discharge Plan: [place] In-house Referral: Clinical Social Work Discharge Planning Services: NA Post Acute Care Choice: [place] Living arrangements for the past 2 months: [place]                 DME Arranged: N/A DME Agency: NA       HH Arranged: NA [place] Agency: NA         Social Determinants of Health (SDOH) Interventions    Readmission Risk Interventions No flowsheet data found.

## 2020-04-09 NOTE — ED Provider Notes (Signed)
[facility] EMERGENCY DEPARTMENT Provider Note   CSN: [id number] Arrival date & time: 04/09/20  1212     History Chief Complaint  Patient presents with  . Hip Pain    [name] is a 86 y.o. female with a history of a fib, not anticoagulated, diabetes, hypertension, history of CVA, presenting from her local independent living facility for further evaluation of pain and injury sustained in a fall which occurred on December 1.  She was seen at [facility] health the day of her injury and imaging was revealing for a left eighth rib fracture.  She presents today secondary to worsening left rib pain, also endorsing left abdominal pain and left hip pain which is new since this fall.  She is currently taking hydrocodone for pain relief but does not like how this medicine makes her feel.  She presents today secondary to persistent left chest pain, but also has new complaint of left hip pain.    The social worker at her facility, Gordan Payment is working on an FL2 to get her moved to their higher care floor while she recovers from her injuries.  Until this transition can occur, she has been unable to care for herself in her apartment.    Of note, she was diagnosed with a uti, currently on Bactrim - this was discovered at her recent ED visit at [facility].   HPI     Past Medical History:  Diagnosis Date  . Arthritis   . Atrial fibrillation (HCC)   . Cancer (HCC)    breast  . Cataract   . Diabetes mellitus without complication (HCC)   . Frequent UTI   . Hyperlipidemia   . Hypertension   . Neck pain   . Scoliosis   . Stroke [facility])     Patient Active Problem List   Diagnosis Date Noted  . Encounter to establish care 02/20/2020  . Bilateral subdural hematomas (HCC) 07/16/2019  . CHI (closed head injury), initial encounter 07/14/2019  . Left radial head fracture 07/14/2019  . SDH (subdural hematoma) (HCC)   . Hypertensive urgency   . Subarachnoid hemorrhage following injury (HCC)  07/13/2019  . Acute encephalopathy 06/27/2019  . Heat stroke 06/27/2019  . Hypokalemia 06/27/2019  . Fever, unspecified 06/27/2019  . Stroke (HCC) 06/27/2019  . Altered mental status 06/26/2019  . Right hip pain 06/22/2019  . Chronic left hip pain 05/25/2019  . Depression, recurrent (HCC) 05/25/2019  . Chronic anticoagulation 04/19/2019  . Leg cramps 03/04/2019  . Short-term memory loss 03/04/2019  . DOE (dyspnea on exertion) 03/04/2019  . Aortic atherosclerosis (HCC) 03/04/2019  . Physical deconditioning 01/24/2019  . History of recurrent UTIs 12/03/2018  . Chronic pain of right knee 10/08/2018  . Type 2 diabetes mellitus with diabetic neuropathy, unspecified (HCC) 08/12/2018  . Pulmonary vascular congestion   . Chronic diastolic CHF (congestive heart failure) (HCC)   . Acute hypoxemic respiratory failure (HCC) 12/06/2017  . Permanent atrial fibrillation (HCC) 12/06/2017  . Trochanteric bursitis, right hip 07/16/2017  . Other intervertebral disc degeneration, lumbar region 07/16/2017  . Osteoporosis 11/04/2016  . Diabetic polyneuropathy associated with type 2 diabetes mellitus (HCC) 11/14/2014  . Fibromyalgia 02/11/2013  . CVA (cerebral vascular accident) (HCC) 08/01/2008  . Osteoarthritis 04/09/2007  . Hyperlipidemia associated with type 2 diabetes mellitus (HCC) 03/08/2007  . Hypertension associated with diabetes (HCC) 03/08/2007  . Pulmonary fibrosis (HCC) 03/08/2007  . GERD 03/08/2007  . BREAST CANCER, HX OF 03/08/2007    Past Surgical History:  Procedure Laterality Date  . APPENDECTOMY    . BREAST LUMPECTOMY Left   . CHOLECYSTECTOMY    . JOINT REPLACEMENT     bilat knee   . REPLACEMENT TOTAL KNEE BILATERAL Bilateral   . TONSILLECTOMY       OB History   No obstetric history on file.     Family History  Problem Relation Age of Onset  . Cancer Mother        lung  . Arthritis Mother   . Heart disease Father        endocarditis  . Cancer Brother         lung, throat  . Alcohol abuse Brother     Social History   Tobacco Use  . Smoking status: Never Smoker  . Smokeless tobacco: Never Used  Vaping Use  . Vaping Use: Never used  Substance Use Topics  . Alcohol use: No    Alcohol/week: 0.0 standard drinks  . Drug use: No    Home Medications Prior to Admission medications   Medication Sig Start Date End Date Taking? Authorizing Provider  acetaminophen (TYLENOL) 500 MG tablet Take 1 tablet (500 mg total) by mouth every 6 (six) hours as needed for moderate pain. 07/16/19   Arrien, York Ram, MD  atorvastatin (LIPITOR) 80 MG tablet Take 1 tablet (80 mg total) by mouth at bedtime. 02/21/20   Daryll Drown, NP  blood glucose meter kit and supplies Dispense based on patient and insurance preference. Use up to four times daily as directed. (FOR ICD-10 E10.9, E11.9).accucheck 02/20/20   Daryll Drown, NP  cephALEXin (KEFLEX) 500 MG capsule Take 1 capsule (500 mg total) by mouth 2 (two) times daily. 02/25/20   Daryll Drown, NP  Cholecalciferol (VITAMIN D3) 50 MCG (2000 UT) TABS Take 2,000 Units by mouth in the morning.    [provider]  cloNIDine (CATAPRES) 0.1 MG tablet Take 1 tablet (0.1 mg total) by mouth 2 (two) times daily as needed (for Systolic over 180). 02/21/20   Daryll Drown, NP  DULoxetine (CYMBALTA) 20 MG capsule Take 1 capsule (20 mg total) by mouth daily. 02/20/20 03/21/20  Daryll Drown, NP  ferrous sulfate 325 (65 FE) MG tablet Take 1 tablet (325 mg total) by mouth daily with breakfast. 02/20/20   Daryll Drown, NP  glucose blood (ONE TOUCH ULTRA TEST) test strip USE TO check blood sugars twice daily 02/28/20   Gabriel Earing, FNP  hydrALAZINE (APRESOLINE) 50 MG tablet Take 1 tablet (50 mg total) by mouth 3 (three) times daily. 04/03/20   Daryll Drown, NP  Insulin Pen Needle (PEN NEEDLES) 32G X 4 MM MISC Patient test bid 02/20/20   Daryll Drown, NP  Insulin Pen Needle 31G X 8 MM MISC  Use once daily with lantus solostar 10/30/17   Mechele Claude, MD  Lancets (FREESTYLE) lancets Use as instructed 02/20/20   Daryll Drown, NP  LANTUS SOLOSTAR 100 UNIT/ML Solostar Pen INJECT 50 UNITS ONCE DAILY AT 10PM Patient taking differently: Inject 10 Units into the skin at bedtime.  06/18/19   Sonny Masters, FNP  Magnesium 250 MG TABS Take 1 tablet (250 mg total) by mouth daily. 02/20/20   Daryll Drown, NP  meloxicam (MOBIC) 7.5 MG tablet Take 1 tablet (7.5 mg total) by mouth daily. 02/28/20   Gabriel Earing, FNP  metFORMIN (GLUCOPHAGE) 500 MG tablet Take 1 tablet (500 mg total) by mouth daily with breakfast.  02/20/20   Daryll Drown, NP  NOVOLOG FLEXPEN 100 UNIT/ML FlexPen Sliding scales 09/14/19   [provider]  omeprazole (PRILOSEC) 20 MG capsule TAKE  (1)  CAPSULE  TWICE DAILY (TAKE ON AN EMPTY STOMACH AT LEAST 30 MIN- UTES BEFORE MEALS). 03/15/20   Daryll Drown, NP  ondansetron (ZOFRAN) 4 MG tablet Take 1 tablet (4 mg total) by mouth every 8 (eight) hours as needed for nausea or vomiting. 02/20/20   Daryll Drown, NP  oxymetazoline (AFRIN) 0.05 % nasal spray Place 1 spray into both nostrils 2 (two) times daily. 02/20/20   Daryll Drown, NP  potassium chloride SA (KLOR-CON) 20 MEQ tablet Take 20 mEq by mouth daily.    [provider]  rOPINIRole (REQUIP) 0.25 MG tablet Take 1 tablet (0.25 mg total) by mouth 3 (three) times daily. Take 0.25 mg by mouth two times a day and 0.25 mg at bedtime 04/03/20 05/03/20  Daryll Drown, NP  SUPER B COMPLEX/C PO Take 1 tablet by mouth daily.    [provider]  tiZANidine (ZANAFLEX) 4 MG capsule Take 1 capsule (4 mg total) by mouth 3 (three) times daily as needed for muscle spasms. 02/28/20   Gabriel Earing, FNP  traMADol (ULTRAM) 50 MG tablet Take 50 mg by mouth 2 (two) times daily as needed. 12/08/19   [provider]  traZODone (DESYREL) 150 MG tablet Take 0.5 tablets (75 mg total) by mouth  at bedtime. 02/20/20   Daryll Drown, NP  Insulin Glargine (LANTUS SOLOSTAR) 100 UNIT/ML Solostar Pen INJECT 50 UNITS ONCE DAILY AT 10PM 11/01/18   Sonny Masters, FNP    Allergies    Diltiazem hcl, Ace inhibitors, and Cymbalta [duloxetine hcl]  Review of Systems   Review of Systems  Constitutional: Negative for chills and fever.  HENT: Negative for congestion and sore throat.   Eyes: Negative.   Respiratory: Negative for chest tightness and shortness of breath.   Cardiovascular: Positive for chest pain.  Gastrointestinal: Negative for abdominal pain, nausea and vomiting.  Genitourinary: Negative.   Musculoskeletal: Positive for arthralgias, back pain and gait problem. Negative for joint swelling and neck pain.  Skin: Negative.  Negative for rash and wound.  Neurological: Negative for dizziness, weakness, light-headedness, numbness and headaches.  Psychiatric/Behavioral: Negative.     Physical Exam Updated Vital Signs BP (!) 170/72 (BP Location: Right Arm)   Pulse 74   Temp 98.1 F (36.7 C) (Oral)   Resp 20   Ht 5' (1.524 m)   Wt 63 kg   SpO2 98%   BMI 27.13 kg/m   Physical Exam Vitals and nursing note reviewed.  Constitutional:      Appearance: She is well-developed.  HENT:     Head: Normocephalic and atraumatic.     Mouth/Throat:     Pharynx: Oropharynx is clear.  Eyes:     Conjunctiva/sclera: Conjunctivae normal.  Cardiovascular:     Rate and Rhythm: Normal rate and regular rhythm.     Heart sounds: Normal heart sounds.  Pulmonary:     Effort: Pulmonary effort is normal.     Breath sounds: Normal breath sounds. No wheezing or rhonchi.     Comments: Poor effort.  TTP left lateral ribcage. No palpable deformity. Chest:     Chest wall: Tenderness present.  Abdominal:     General: Bowel sounds are normal.     Palpations: Abdomen is soft.     Tenderness: There is abdominal  tenderness in the left upper quadrant.     Comments: ttp luq without guarding or  rebound.   Musculoskeletal:        General: Normal range of motion.     Cervical back: Normal range of motion. No tenderness.  Skin:    General: Skin is warm and dry.  Neurological:     Mental Status: She is alert.     ED Results / Procedures / Treatments   Labs (all labs ordered are listed, but only abnormal results are displayed) Labs Reviewed  CBC WITH DIFFERENTIAL/PLATELET - Abnormal; Notable for the following components:      Result Value   RBC 5.25 (*)    HCT 48.3 (*)    RDW 15.8 (*)    All other components within normal limits  BASIC METABOLIC PANEL    EKG None  Radiology CT Chest W Contrast  Result Date: 04/09/2020 CLINICAL DATA:  Patient fell 3 days ago and was diagnosed with left rib fractures. Now has left hip and upper abdominal pain. EXAM: CT CHEST, ABDOMEN, AND PELVIS WITH CONTRAST TECHNIQUE: Multidetector CT imaging of the chest, abdomen and pelvis was performed following the standard protocol during bolus administration of intravenous contrast. CONTRAST:  70mL OMNIPAQUE IOHEXOL 300 MG/ML  SOLN COMPARISON:  Chest CTA 12/06/2017. FINDINGS: CT CHEST FINDINGS Cardiovascular: The heart is enlarged. There is no significant pericardial fluid. There is atherosclerosis of the aorta, great vessels and coronary arteries. Calcifications of the aortic valve are noted. Mediastinum/Nodes: There are no enlarged mediastinal, hilar or axillary lymph nodes. There are surgical clips in the left breast. The thyroid gland and trachea appear normal. There is a small hiatal hernia. Lungs/Pleura: Small left pleural effusion with mildly increased dependent atelectasis in the left lower lobe. There is underlying mild chronic lung disease with bibasilar scarring and bronchiectasis. There are post radiation changes anteriorly in the left upper lobe. No evidence of pneumothorax or suspicious pulmonary nodularity. Musculoskeletal/Chest wall: There are multiple acute left-sided rib fractures  posterolaterally. Fracture of the left 10th rib is mildly displaced and with surrounding soft tissue swelling. There are nondisplaced fractures of the left 6, 7th and 8th ribs. There are old left-sided rib fractures as well. Degenerative changes are present within the spine. No evidence of osseous metastatic disease. CT ABDOMEN AND PELVIS FINDINGS Hepatobiliary: The liver is normal in density without suspicious focal abnormality. No evidence of gallstones, gallbladder wall thickening or biliary dilatation. Pancreas: Mildly atrophied without evidence of acute injury, ductal dilatation or surrounding inflammation. Spleen: No evidence of acute splenic injury. Normal in size without focal abnormality. Adrenals/Urinary Tract: Both adrenal glands appear normal. Probable bilateral renal cysts. The left renal collecting system is partially duplicated. No evidence of urinary tract calculus or hydronephrosis. There is bladder wall thickening and trabeculation. A small amount of gas within the urinary bladder may be iatrogenic. Stomach/Bowel: No evidence of bowel wall thickening, distention or surrounding inflammatory change. Moderate to large amount of stool throughout the colon. Vascular/Lymphatic: There are no enlarged abdominal or pelvic lymph nodes. No evidence of retroperitoneal hematoma. Diffuse aortic and branch vessel atherosclerosis without acute vascular findings. The portal, superior mesenteric and splenic veins are patent. Reproductive: The uterus and ovaries appear unremarkable. Other: Small umbilical hernia containing only fat. No ascites, free air or focal extraluminal fluid collection. Musculoskeletal: No acute or significant osseous findings. Specifically, no evidence of acute fracture in the lumbar spine, pelvis or proximal femurs. There are degenerative changes throughout the lumbar spine. IMPRESSION: 1.  Multiple acute left-sided rib fractures as described. No evidence of pneumothorax. 2. Small left pleural  effusion with mildly increased dependent atelectasis in the left lower lobe. Underlying chronic lung disease with bibasilar scarring and bronchiectasis. 3. No evidence of acute posttraumatic findings in the abdomen or pelvis. 4. Aortic Atherosclerosis (ICD10-I70.0). Electronically Signed   By: Richardean Sale M.D.   On: 04/09/2020 17:45   CT ABDOMEN PELVIS W CONTRAST  Result Date: 04/09/2020 CLINICAL DATA:  Patient fell 3 days ago and was diagnosed with left rib fractures. Now has left hip and upper abdominal pain. EXAM: CT CHEST, ABDOMEN, AND PELVIS WITH CONTRAST TECHNIQUE: Multidetector CT imaging of the chest, abdomen and pelvis was performed following the standard protocol during bolus administration of intravenous contrast. CONTRAST:  82mL OMNIPAQUE IOHEXOL 300 MG/ML  SOLN COMPARISON:  Chest CTA 12/06/2017. FINDINGS: CT CHEST FINDINGS Cardiovascular: The heart is enlarged. There is no significant pericardial fluid. There is atherosclerosis of the aorta, great vessels and coronary arteries. Calcifications of the aortic valve are noted. Mediastinum/Nodes: There are no enlarged mediastinal, hilar or axillary lymph nodes. There are surgical clips in the left breast. The thyroid gland and trachea appear normal. There is a small hiatal hernia. Lungs/Pleura: Small left pleural effusion with mildly increased dependent atelectasis in the left lower lobe. There is underlying mild chronic lung disease with bibasilar scarring and bronchiectasis. There are post radiation changes anteriorly in the left upper lobe. No evidence of pneumothorax or suspicious pulmonary nodularity. Musculoskeletal/Chest wall: There are multiple acute left-sided rib fractures posterolaterally. Fracture of the left 10th rib is mildly displaced and with surrounding soft tissue swelling. There are nondisplaced fractures of the left 6, 7th and 8th ribs. There are old left-sided rib fractures as well. Degenerative changes are present within the  spine. No evidence of osseous metastatic disease. CT ABDOMEN AND PELVIS FINDINGS Hepatobiliary: The liver is normal in density without suspicious focal abnormality. No evidence of gallstones, gallbladder wall thickening or biliary dilatation. Pancreas: Mildly atrophied without evidence of acute injury, ductal dilatation or surrounding inflammation. Spleen: No evidence of acute splenic injury. Normal in size without focal abnormality. Adrenals/Urinary Tract: Both adrenal glands appear normal. Probable bilateral renal cysts. The left renal collecting system is partially duplicated. No evidence of urinary tract calculus or hydronephrosis. There is bladder wall thickening and trabeculation. A small amount of gas within the urinary bladder may be iatrogenic. Stomach/Bowel: No evidence of bowel wall thickening, distention or surrounding inflammatory change. Moderate to large amount of stool throughout the colon. Vascular/Lymphatic: There are no enlarged abdominal or pelvic lymph nodes. No evidence of retroperitoneal hematoma. Diffuse aortic and branch vessel atherosclerosis without acute vascular findings. The portal, superior mesenteric and splenic veins are patent. Reproductive: The uterus and ovaries appear unremarkable. Other: Small umbilical hernia containing only fat. No ascites, free air or focal extraluminal fluid collection. Musculoskeletal: No acute or significant osseous findings. Specifically, no evidence of acute fracture in the lumbar spine, pelvis or proximal femurs. There are degenerative changes throughout the lumbar spine. IMPRESSION: 1. Multiple acute left-sided rib fractures as described. No evidence of pneumothorax. 2. Small left pleural effusion with mildly increased dependent atelectasis in the left lower lobe. Underlying chronic lung disease with bibasilar scarring and bronchiectasis. 3. No evidence of acute posttraumatic findings in the abdomen or pelvis. 4. Aortic Atherosclerosis (ICD10-I70.0).  Electronically Signed   By: Richardean Sale M.D.   On: 04/09/2020 17:45   DG Hip Unilat W or Wo Pelvis 2-3 Views Left  Result Date: 04/09/2020 CLINICAL DATA:  LEFT hip pain, fell on 04/06/2020 EXAM: DG HIP (WITH OR WITHOUT PELVIS) 2-3V LEFT COMPARISON:  02/28/2020 FINDINGS: Marked osseous demineralization. Hip and SI joint spaces preserved. No fracture, dislocation, or bone destruction. IMPRESSION: No acute osseous abnormalities. Electronically Signed   By: Lavonia Dana M.D.   On: 04/09/2020 13:25    Procedures Procedures (including critical care time)  Medications Ordered in ED Medications  atorvastatin (LIPITOR) tablet 80 mg (has no administration in time range)  cloNIDine (CATAPRES) tablet 0.1 mg (has no administration in time range)  Vitamin D3 TABS 2,000 Units (has no administration in time range)  DULoxetine (CYMBALTA) DR capsule 20 mg (has no administration in time range)  hydrALAZINE (APRESOLINE) tablet 50 mg (has no administration in time range)  insulin glargine (LANTUS) Solostar Pen 10 Units (has no administration in time range)  metFORMIN (GLUCOPHAGE) tablet 500 mg (has no administration in time range)  pantoprazole (PROTONIX) EC tablet 40 mg (has no administration in time range)  ondansetron (ZOFRAN) tablet 4 mg (has no administration in time range)  potassium chloride SA (KLOR-CON) CR tablet 20 mEq (has no administration in time range)  rOPINIRole (REQUIP) tablet 0.25 mg (has no administration in time range)  traZODone (DESYREL) tablet 75 mg (has no administration in time range)  HYDROcodone-acetaminophen (NORCO/VICODIN) 5-325 MG per tablet 1 tablet (has no administration in time range)  iohexol (OMNIPAQUE) 300 MG/ML solution 100 mL (75 mLs Intravenous Contrast Given 04/09/20 1650)    ED Course  I have reviewed the triage vital signs and the nursing notes.  Pertinent labs & imaging results that were available during my care of the patient were reviewed by me and considered  in my medical decision making (see chart for details).    MDM Rules/Calculators/A&P                          Pt with multiple left sided rib fractures, nondisplaced, pending move to SNF at her [place].  FL2 being completed and per SW here and at [facility], there is a bed available for her, but may not be approved until tomorrow.  Pt will be boarding status with [place] pending this placement. Orders placed for home meds and diet.     Final Clinical Impression(s) / ED Diagnoses Final diagnoses:  Closed fracture of multiple ribs of left side, initial encounter    Rx / DC Orders ED Discharge Orders    None       Landis Martins 04/09/20 1832    Fredia Sorrow, MD 04/10/20 1535

## 2020-04-10 ENCOUNTER — Ambulatory Visit: Payer: PPO | Admitting: Nurse Practitioner

## 2020-04-10 LAB — CBG MONITORING, ED: Glucose-Capillary: 186 mg/dL — ABNORMAL HIGH (ref 70–99)

## 2020-04-10 NOTE — TOC Progression Note (Signed)
Transition of Care [facility]) - Progression Note   Patient Details  Name: [name] MRN: [id number] Date of Birth: Jul 26, 1925  Transition of Care [facility]) CM/SW [place], [name] Phone Number: 04/10/2020, 1:59 PM  Clinical Narrative: CSW called [employer] to start insurance authorization for SNF and ambulance transport. Documents uploaded to [employer]. CSW completed PASRR assessment with patient and Jacqualin Combes with [facility] over the phone. CSW followed up with Tammy with [facility]. Per Tammy, patient is currently under med review for SNF approval. CSW notified Tammy patient is otherwise ready for discharge. TOC awaiting insurance approval.  Expected Discharge Plan: [place] Barriers to Discharge: Continued Medical Work up, ED Awaiting State Approval Forensic scientist)  Expected Discharge Plan and Services Expected Discharge Plan: [place] In-house Referral: Clinical Social Work Discharge Planning Services: NA Post Acute Care Choice: [place] Living arrangements for the past 2 months: [place]              DME Arranged: N/A DME Agency: NA HH Arranged: NA [place] Agency: NA  Readmission Risk Interventions No flowsheet data found.

## 2020-04-10 NOTE — ED Notes (Signed)
Pt requested stool softner. Pt has not had a BM x3days. Dr.Wentz made aware.

## 2020-04-10 NOTE — ED Provider Notes (Signed)
5:20 PM-progress note.  Patient evaluated yesterday, found to have injury to ribs, fractures multiple, and has been evaluated by social services and physical therapy.  She requires placement in skilled nursing facility.  She has been hemodynamically stable with mild hypertension.  She is receiving hydralazine.  She is receiving her usual medications.  At this time she is requesting a stool softener.  Colace ordered.  Vital signs are stable.  No current needs identified.   Daleen Bo, MD 04/10/20 1743

## 2020-04-10 NOTE — Care Management (Cosign Needed Addendum)
RE: [name] Date of Birth: May 16, 1925 Date: 04/10/2020 MUST ID: [id number]  To Whom It May Concern:  Please be advised that the above name patient will require a short-term nursing home stay--anticipated 30 days or less rehabilitation and strengthening. The plan is for return home.

## 2020-04-11 DIAGNOSIS — B373 Candidiasis of vulva and vagina: Secondary | ICD-10-CM | POA: Diagnosis not present

## 2020-04-11 DIAGNOSIS — I4891 Unspecified atrial fibrillation: Secondary | ICD-10-CM | POA: Diagnosis not present

## 2020-04-11 DIAGNOSIS — S2242XD Multiple fractures of ribs, left side, subsequent encounter for fracture with routine healing: Secondary | ICD-10-CM | POA: Diagnosis not present

## 2020-04-11 DIAGNOSIS — R040 Epistaxis: Secondary | ICD-10-CM | POA: Diagnosis not present

## 2020-04-11 DIAGNOSIS — N390 Urinary tract infection, site not specified: Secondary | ICD-10-CM | POA: Diagnosis not present

## 2020-04-11 DIAGNOSIS — R600 Localized edema: Secondary | ICD-10-CM | POA: Diagnosis not present

## 2020-04-11 DIAGNOSIS — R5381 Other malaise: Secondary | ICD-10-CM | POA: Diagnosis not present

## 2020-04-11 DIAGNOSIS — G8929 Other chronic pain: Secondary | ICD-10-CM | POA: Diagnosis not present

## 2020-04-11 DIAGNOSIS — E162 Hypoglycemia, unspecified: Secondary | ICD-10-CM | POA: Diagnosis not present

## 2020-04-11 DIAGNOSIS — R52 Pain, unspecified: Secondary | ICD-10-CM | POA: Diagnosis not present

## 2020-04-11 DIAGNOSIS — R300 Dysuria: Secondary | ICD-10-CM | POA: Diagnosis not present

## 2020-04-11 DIAGNOSIS — E1169 Type 2 diabetes mellitus with other specified complication: Secondary | ICD-10-CM | POA: Diagnosis not present

## 2020-04-11 DIAGNOSIS — E119 Type 2 diabetes mellitus without complications: Secondary | ICD-10-CM | POA: Diagnosis not present

## 2020-04-11 DIAGNOSIS — S2242XA Multiple fractures of ribs, left side, initial encounter for closed fracture: Secondary | ICD-10-CM | POA: Diagnosis not present

## 2020-04-11 DIAGNOSIS — R42 Dizziness and giddiness: Secondary | ICD-10-CM | POA: Diagnosis not present

## 2020-04-11 DIAGNOSIS — K219 Gastro-esophageal reflux disease without esophagitis: Secondary | ICD-10-CM | POA: Diagnosis not present

## 2020-04-11 DIAGNOSIS — I739 Peripheral vascular disease, unspecified: Secondary | ICD-10-CM | POA: Diagnosis not present

## 2020-04-11 DIAGNOSIS — E559 Vitamin D deficiency, unspecified: Secondary | ICD-10-CM | POA: Diagnosis not present

## 2020-04-11 DIAGNOSIS — J302 Other seasonal allergic rhinitis: Secondary | ICD-10-CM | POA: Diagnosis not present

## 2020-04-11 DIAGNOSIS — R279 Unspecified lack of coordination: Secondary | ICD-10-CM | POA: Diagnosis not present

## 2020-04-11 DIAGNOSIS — Z9181 History of falling: Secondary | ICD-10-CM | POA: Diagnosis not present

## 2020-04-11 DIAGNOSIS — M549 Dorsalgia, unspecified: Secondary | ICD-10-CM | POA: Diagnosis not present

## 2020-04-11 DIAGNOSIS — R4182 Altered mental status, unspecified: Secondary | ICD-10-CM | POA: Diagnosis not present

## 2020-04-11 DIAGNOSIS — I5032 Chronic diastolic (congestive) heart failure: Secondary | ICD-10-CM | POA: Diagnosis not present

## 2020-04-11 DIAGNOSIS — D508 Other iron deficiency anemias: Secondary | ICD-10-CM | POA: Diagnosis not present

## 2020-04-11 DIAGNOSIS — I10 Essential (primary) hypertension: Secondary | ICD-10-CM | POA: Diagnosis not present

## 2020-04-11 DIAGNOSIS — M6281 Muscle weakness (generalized): Secondary | ICD-10-CM | POA: Diagnosis not present

## 2020-04-11 DIAGNOSIS — K5900 Constipation, unspecified: Secondary | ICD-10-CM | POA: Diagnosis not present

## 2020-04-11 DIAGNOSIS — G4700 Insomnia, unspecified: Secondary | ICD-10-CM | POA: Diagnosis not present

## 2020-04-11 LAB — RESP PANEL BY RT-PCR (FLU A&B, COVID) ARPGX2
Influenza A by PCR: NEGATIVE
Influenza B by PCR: NEGATIVE
SARS Coronavirus 2 by RT PCR: NEGATIVE

## 2020-04-11 NOTE — ED Provider Notes (Signed)
Progress note for the day, the patient is in no distress, she has no complaints, her vital signs have been reassuring, minimal hypertension.  She has multiple rib fractures on the left with a small effusion, she has been accepted at [facility] skilled nursing facility, they have requested a Covid test be performed and an after visit summary which has been completed at this time.  It is 2:00 PM, the patient can be discharged today once Covid test has come back.   Noemi Chapel, MD 04/11/20 1359

## 2020-04-11 NOTE — Discharge Instructions (Addendum)
It looks like you have approximately 4 rib fractures on the left, there is a small amount of fluid in your chest, please have the provider at your skilled nursing facility order a repeat chest x-ray to be done within 2 weeks to make sure this is getting better Tylenol or ibuprofen as needed for pain Use the incentive spirometer every hour while you are awake to help keep your lungs inflated Emergency department for severe worsening symptoms

## 2020-04-11 NOTE — TOC Transition Note (Signed)
Transition of Care [facility]) - CM/SW Discharge Note  Patient Details  Name: [name] MRN: [id number] Date of Birth: Aug 16, 1925  Transition of Care [facility]) CM/SW Contact:  Sherie Don, [name] Phone Number: 04/11/2020, 2:47 PM  Clinical Narrative: CSW received call from [place] with [employer] and patient has been approved for SNF [facility] [phone]) and EMS transport [facility] [phone]). Patient and son updated. EDP notified. Discharge orders, FL2, SNF transfer report, and PT notes faxed in hub to [facility]. AVS manually faxed to Elyse Hsu with [facility] [phone]) and confirmed it was received. Patient can discharge to SNF once the COVID test comes back negative. The number to call for report is [phone] and she will go to room 56. RN updated. TOC signing off.  Final next level of care: [place] Barriers to Discharge: Barriers Resolved  Patient Goals and CMS Choice Patient states their goals for this hospitalization and ongoing recovery are:: Discharge to [facility] for rehab CMS Medicare.gov Compare Post Acute Care list provided to:: Patient Choice offered to / list presented to : Patient  Discharge Placement       Patient chooses bed at: [facility] Patient to be transferred to facility by: [place] Name of family member notified: Early Ord (son) Patient and family notified of of transfer: 04/11/20  Discharge Plan and Services In-house Referral: Clinical Social Work Discharge Planning Services: NA Post Acute Care Choice: [place]          DME Arranged: N/A DME Agency: NA HH Arranged: NA [place] Agency: NA  Readmission Risk Interventions No flowsheet data found.

## 2020-04-11 NOTE — ED Notes (Signed)
Pt in bed, pt states that her pain is an 8/10, states that she doesn't need anything at this time.  Call bell within reach, bed in low locked position.

## 2020-04-11 NOTE — TOC Progression Note (Signed)
Transition of Care [facility]) - Progression Note   Patient Details  Name: [name] MRN: [id number] Date of Birth: 03/29/1926  Transition of Care [facility]) CM/SW [place], [name] Phone Number: 04/11/2020, 12:37 PM  Clinical Narrative: CSW called [employer] to follow up with insurance approval. Approval is still pending. Elyse Hsu with [facility] updated. TOC awaiting insurance approval.  Expected Discharge Plan: [place] Barriers to Discharge: Insurance Authorization  Expected Discharge Plan and Services Expected Discharge Plan: [place] In-house Referral: Clinical Social Work Discharge Planning Services: NA Post Acute Care Choice: [place] Living arrangements for the past 2 months: [place]             DME Arranged: N/A DME Agency: NA HH Arranged: NA [place] Agency: NA  Readmission Risk Interventions No flowsheet data found.

## 2020-04-12 DIAGNOSIS — I10 Essential (primary) hypertension: Secondary | ICD-10-CM | POA: Diagnosis not present

## 2020-04-12 DIAGNOSIS — I739 Peripheral vascular disease, unspecified: Secondary | ICD-10-CM | POA: Diagnosis not present

## 2020-04-12 DIAGNOSIS — E119 Type 2 diabetes mellitus without complications: Secondary | ICD-10-CM | POA: Diagnosis not present

## 2020-04-12 DIAGNOSIS — G4700 Insomnia, unspecified: Secondary | ICD-10-CM | POA: Diagnosis not present

## 2020-04-12 DIAGNOSIS — K219 Gastro-esophageal reflux disease without esophagitis: Secondary | ICD-10-CM | POA: Diagnosis not present

## 2020-04-12 DIAGNOSIS — R600 Localized edema: Secondary | ICD-10-CM | POA: Diagnosis not present

## 2020-04-12 DIAGNOSIS — J302 Other seasonal allergic rhinitis: Secondary | ICD-10-CM | POA: Diagnosis not present

## 2020-04-13 ENCOUNTER — Ambulatory Visit: Payer: PPO | Admitting: Nurse Practitioner

## 2020-04-13 DIAGNOSIS — R040 Epistaxis: Secondary | ICD-10-CM | POA: Diagnosis not present

## 2020-04-13 DIAGNOSIS — E119 Type 2 diabetes mellitus without complications: Secondary | ICD-10-CM | POA: Diagnosis not present

## 2020-04-13 DIAGNOSIS — G4700 Insomnia, unspecified: Secondary | ICD-10-CM | POA: Diagnosis not present

## 2020-04-13 DIAGNOSIS — I10 Essential (primary) hypertension: Secondary | ICD-10-CM | POA: Diagnosis not present

## 2020-04-13 DIAGNOSIS — I739 Peripheral vascular disease, unspecified: Secondary | ICD-10-CM | POA: Diagnosis not present

## 2020-04-13 DIAGNOSIS — K219 Gastro-esophageal reflux disease without esophagitis: Secondary | ICD-10-CM | POA: Diagnosis not present

## 2020-04-13 DIAGNOSIS — D508 Other iron deficiency anemias: Secondary | ICD-10-CM | POA: Diagnosis not present

## 2020-04-13 DIAGNOSIS — E559 Vitamin D deficiency, unspecified: Secondary | ICD-10-CM | POA: Diagnosis not present

## 2020-04-14 DIAGNOSIS — I5032 Chronic diastolic (congestive) heart failure: Secondary | ICD-10-CM | POA: Diagnosis not present

## 2020-04-14 DIAGNOSIS — M6281 Muscle weakness (generalized): Secondary | ICD-10-CM | POA: Diagnosis not present

## 2020-04-14 DIAGNOSIS — Z9181 History of falling: Secondary | ICD-10-CM | POA: Diagnosis not present

## 2020-04-18 ENCOUNTER — Encounter: Payer: Self-pay | Admitting: Nurse Practitioner

## 2020-04-20 DIAGNOSIS — R300 Dysuria: Secondary | ICD-10-CM | POA: Diagnosis not present

## 2020-04-23 DIAGNOSIS — N390 Urinary tract infection, site not specified: Secondary | ICD-10-CM | POA: Diagnosis not present

## 2020-04-25 DIAGNOSIS — G4700 Insomnia, unspecified: Secondary | ICD-10-CM | POA: Diagnosis not present

## 2020-04-25 DIAGNOSIS — R4182 Altered mental status, unspecified: Secondary | ICD-10-CM | POA: Diagnosis not present

## 2020-04-25 DIAGNOSIS — N390 Urinary tract infection, site not specified: Secondary | ICD-10-CM | POA: Diagnosis not present

## 2020-04-25 DIAGNOSIS — K5900 Constipation, unspecified: Secondary | ICD-10-CM | POA: Diagnosis not present

## 2020-04-25 DIAGNOSIS — R42 Dizziness and giddiness: Secondary | ICD-10-CM | POA: Diagnosis not present

## 2020-04-25 DIAGNOSIS — R040 Epistaxis: Secondary | ICD-10-CM | POA: Diagnosis not present

## 2020-04-25 DIAGNOSIS — I4891 Unspecified atrial fibrillation: Secondary | ICD-10-CM | POA: Diagnosis not present

## 2020-05-04 DIAGNOSIS — M6281 Muscle weakness (generalized): Secondary | ICD-10-CM | POA: Diagnosis not present

## 2020-05-07 DIAGNOSIS — I10 Essential (primary) hypertension: Secondary | ICD-10-CM | POA: Diagnosis not present

## 2020-05-07 DIAGNOSIS — K5900 Constipation, unspecified: Secondary | ICD-10-CM | POA: Diagnosis not present

## 2020-05-07 DIAGNOSIS — G8929 Other chronic pain: Secondary | ICD-10-CM | POA: Diagnosis not present

## 2020-05-07 DIAGNOSIS — N390 Urinary tract infection, site not specified: Secondary | ICD-10-CM | POA: Diagnosis not present

## 2020-05-07 DIAGNOSIS — R42 Dizziness and giddiness: Secondary | ICD-10-CM | POA: Diagnosis not present

## 2020-05-07 DIAGNOSIS — E119 Type 2 diabetes mellitus without complications: Secondary | ICD-10-CM | POA: Diagnosis not present

## 2020-05-07 DIAGNOSIS — D508 Other iron deficiency anemias: Secondary | ICD-10-CM | POA: Diagnosis not present

## 2020-05-07 DIAGNOSIS — I4891 Unspecified atrial fibrillation: Secondary | ICD-10-CM | POA: Diagnosis not present

## 2020-05-07 DIAGNOSIS — E559 Vitamin D deficiency, unspecified: Secondary | ICD-10-CM | POA: Diagnosis not present

## 2020-05-11 ENCOUNTER — Telehealth: Payer: PPO

## 2020-05-11 DIAGNOSIS — E1169 Type 2 diabetes mellitus with other specified complication: Secondary | ICD-10-CM | POA: Diagnosis not present

## 2020-05-17 DIAGNOSIS — K5900 Constipation, unspecified: Secondary | ICD-10-CM | POA: Diagnosis not present

## 2020-05-17 DIAGNOSIS — N390 Urinary tract infection, site not specified: Secondary | ICD-10-CM | POA: Diagnosis not present

## 2020-05-17 DIAGNOSIS — D508 Other iron deficiency anemias: Secondary | ICD-10-CM | POA: Diagnosis not present

## 2020-05-17 DIAGNOSIS — R42 Dizziness and giddiness: Secondary | ICD-10-CM | POA: Diagnosis not present

## 2020-05-17 DIAGNOSIS — E162 Hypoglycemia, unspecified: Secondary | ICD-10-CM | POA: Diagnosis not present

## 2020-05-17 DIAGNOSIS — B373 Candidiasis of vulva and vagina: Secondary | ICD-10-CM | POA: Diagnosis not present

## 2020-05-17 DIAGNOSIS — R5381 Other malaise: Secondary | ICD-10-CM | POA: Diagnosis not present

## 2020-05-22 DIAGNOSIS — E1142 Type 2 diabetes mellitus with diabetic polyneuropathy: Secondary | ICD-10-CM | POA: Diagnosis not present

## 2020-05-28 DIAGNOSIS — D508 Other iron deficiency anemias: Secondary | ICD-10-CM | POA: Diagnosis not present

## 2020-05-28 DIAGNOSIS — N390 Urinary tract infection, site not specified: Secondary | ICD-10-CM | POA: Diagnosis not present

## 2020-05-28 DIAGNOSIS — R4182 Altered mental status, unspecified: Secondary | ICD-10-CM | POA: Diagnosis not present

## 2020-05-28 DIAGNOSIS — R42 Dizziness and giddiness: Secondary | ICD-10-CM | POA: Diagnosis not present

## 2020-05-28 DIAGNOSIS — G2581 Restless legs syndrome: Secondary | ICD-10-CM | POA: Diagnosis not present

## 2020-05-28 DIAGNOSIS — K5900 Constipation, unspecified: Secondary | ICD-10-CM | POA: Diagnosis not present

## 2020-05-28 DIAGNOSIS — B373 Candidiasis of vulva and vagina: Secondary | ICD-10-CM | POA: Diagnosis not present

## 2020-05-29 DIAGNOSIS — I739 Peripheral vascular disease, unspecified: Secondary | ICD-10-CM | POA: Diagnosis not present

## 2020-05-29 DIAGNOSIS — I5032 Chronic diastolic (congestive) heart failure: Secondary | ICD-10-CM | POA: Diagnosis not present

## 2020-06-04 DIAGNOSIS — B373 Candidiasis of vulva and vagina: Secondary | ICD-10-CM | POA: Diagnosis not present

## 2020-06-04 DIAGNOSIS — N390 Urinary tract infection, site not specified: Secondary | ICD-10-CM | POA: Diagnosis not present

## 2020-06-04 DIAGNOSIS — D508 Other iron deficiency anemias: Secondary | ICD-10-CM | POA: Diagnosis not present

## 2020-06-04 DIAGNOSIS — I10 Essential (primary) hypertension: Secondary | ICD-10-CM | POA: Diagnosis not present

## 2020-06-04 DIAGNOSIS — K5900 Constipation, unspecified: Secondary | ICD-10-CM | POA: Diagnosis not present

## 2020-06-04 DIAGNOSIS — I4891 Unspecified atrial fibrillation: Secondary | ICD-10-CM | POA: Diagnosis not present

## 2020-06-04 DIAGNOSIS — G8929 Other chronic pain: Secondary | ICD-10-CM | POA: Diagnosis not present

## 2020-06-12 DIAGNOSIS — I5032 Chronic diastolic (congestive) heart failure: Secondary | ICD-10-CM | POA: Diagnosis not present

## 2020-06-12 DIAGNOSIS — E1142 Type 2 diabetes mellitus with diabetic polyneuropathy: Secondary | ICD-10-CM | POA: Diagnosis not present

## 2020-06-12 DIAGNOSIS — E1169 Type 2 diabetes mellitus with other specified complication: Secondary | ICD-10-CM | POA: Diagnosis not present

## 2020-06-12 DIAGNOSIS — I739 Peripheral vascular disease, unspecified: Secondary | ICD-10-CM | POA: Diagnosis not present

## 2020-06-13 ENCOUNTER — Telehealth: Payer: PPO

## 2020-06-21 DIAGNOSIS — S2242XD Multiple fractures of ribs, left side, subsequent encounter for fracture with routine healing: Secondary | ICD-10-CM | POA: Diagnosis not present

## 2020-06-21 DIAGNOSIS — I739 Peripheral vascular disease, unspecified: Secondary | ICD-10-CM | POA: Diagnosis not present

## 2020-06-21 DIAGNOSIS — K5900 Constipation, unspecified: Secondary | ICD-10-CM | POA: Diagnosis not present

## 2020-06-21 DIAGNOSIS — M797 Fibromyalgia: Secondary | ICD-10-CM | POA: Diagnosis not present

## 2020-06-21 DIAGNOSIS — G4700 Insomnia, unspecified: Secondary | ICD-10-CM | POA: Diagnosis not present

## 2020-06-21 DIAGNOSIS — N390 Urinary tract infection, site not specified: Secondary | ICD-10-CM | POA: Diagnosis not present

## 2020-06-21 DIAGNOSIS — B373 Candidiasis of vulva and vagina: Secondary | ICD-10-CM | POA: Diagnosis not present

## 2020-06-21 DIAGNOSIS — R040 Epistaxis: Secondary | ICD-10-CM | POA: Diagnosis not present

## 2020-06-26 DIAGNOSIS — E1142 Type 2 diabetes mellitus with diabetic polyneuropathy: Secondary | ICD-10-CM | POA: Diagnosis not present

## 2020-07-02 DIAGNOSIS — E559 Vitamin D deficiency, unspecified: Secondary | ICD-10-CM | POA: Diagnosis not present

## 2020-07-02 DIAGNOSIS — K5900 Constipation, unspecified: Secondary | ICD-10-CM | POA: Diagnosis not present

## 2020-07-02 DIAGNOSIS — G8929 Other chronic pain: Secondary | ICD-10-CM | POA: Diagnosis not present

## 2020-07-02 DIAGNOSIS — E119 Type 2 diabetes mellitus without complications: Secondary | ICD-10-CM | POA: Diagnosis not present

## 2020-07-02 DIAGNOSIS — I4891 Unspecified atrial fibrillation: Secondary | ICD-10-CM | POA: Diagnosis not present

## 2020-07-02 DIAGNOSIS — D508 Other iron deficiency anemias: Secondary | ICD-10-CM | POA: Diagnosis not present

## 2020-07-02 DIAGNOSIS — K219 Gastro-esophageal reflux disease without esophagitis: Secondary | ICD-10-CM | POA: Diagnosis not present

## 2020-07-02 DIAGNOSIS — I10 Essential (primary) hypertension: Secondary | ICD-10-CM | POA: Diagnosis not present

## 2020-07-03 ENCOUNTER — Telehealth: Payer: Self-pay | Admitting: *Deleted

## 2020-07-03 NOTE — Telephone Encounter (Signed)
I called to schedule Mrs.Cockerell a follow up visit, she stated she's not seeing Dr.Jordan any more.  Recall removed.

## 2020-07-04 DIAGNOSIS — E1142 Type 2 diabetes mellitus with diabetic polyneuropathy: Secondary | ICD-10-CM | POA: Diagnosis not present

## 2020-07-04 DIAGNOSIS — I69398 Other sequelae of cerebral infarction: Secondary | ICD-10-CM | POA: Diagnosis not present

## 2020-07-04 DIAGNOSIS — R41841 Cognitive communication deficit: Secondary | ICD-10-CM | POA: Diagnosis not present

## 2020-07-09 ENCOUNTER — Non-Acute Institutional Stay: Payer: Self-pay | Admitting: Nurse Practitioner

## 2020-07-09 ENCOUNTER — Other Ambulatory Visit: Payer: Self-pay

## 2020-07-09 DIAGNOSIS — F339 Major depressive disorder, recurrent, unspecified: Secondary | ICD-10-CM | POA: Diagnosis not present

## 2020-07-09 DIAGNOSIS — E1142 Type 2 diabetes mellitus with diabetic polyneuropathy: Secondary | ICD-10-CM | POA: Diagnosis not present

## 2020-07-09 DIAGNOSIS — B373 Candidiasis of vulva and vagina: Secondary | ICD-10-CM

## 2020-07-09 DIAGNOSIS — J302 Other seasonal allergic rhinitis: Secondary | ICD-10-CM | POA: Diagnosis not present

## 2020-07-09 DIAGNOSIS — B3731 Acute candidiasis of vulva and vagina: Secondary | ICD-10-CM

## 2020-07-09 DIAGNOSIS — Z515 Encounter for palliative care: Secondary | ICD-10-CM

## 2020-07-09 DIAGNOSIS — L602 Onychogryphosis: Secondary | ICD-10-CM | POA: Diagnosis not present

## 2020-07-09 NOTE — Progress Notes (Signed)
[place] Consult Note Telephone: [phone]  Fax: [phone]  PATIENT NAME: [name] Room [address] [place] [address] [phone] (home)  DOB: 08-Oct-1925 MRN: [id number]  PRIMARY CARE PROVIDER:    Ivy Lynn, NP,  [place] [address] [phone]  REFERRING PROVIDER:   Ivy Lynn, NP [address] [place],  [place] [address] [phone]  RESPONSIBLE PARTY:   Extended Emergency Contact Information Primary Emergency Contact: Gohlke,Andy Address: [address] [place], [place] [address] [place] Phone: [phone] Mobile Phone: [phone] Relation: Son  I met face to face with patient in facility.   ASSESSMENT AND RECOMMENDATIONS:   Advance Care Planning: Goal of care: Patient's goal of care is comfort while maintaining comfort.   Directives: Patient code status is DNR, she reiterated desire to not be resuscitated in the event of cardiac or respiratory arrest. Patient has a signed MOST form on file in the facility, copy on [facility] EMR. Details of MOST include limited additional intervention, antibiotics if indicated, IV fluids if indicated, no feeding tube.   Symptom Management:  Vulvovaginal candidiasis: Recurrent infection, was treated within the last 90 months. Order placed for Fluconazole $RemoveBeforeD'150mg'JyhEPlVgWojIFs$  by mouth every 72hrs x 3 doses.  Patient with recurrent UTI, seen by facility provider today, order pending for Urology consult. Patient denied fever, chills or abdominal pain. Frequent fall: Last fall was yesterday, sustained small bruise to right elbow. Complained of generalized body ache today. Report taking Tylenol $RemoveBefore'500mg'HLgAxDKMfvbLc$  yesterday with moderate relief. Continue Tylenol $RemoveBeforeDE'500mg'QjzrBlUePgXRdGl$  every 6hrs as needed, may take ordered PRN Tramadol if needed for severe pain. Maintain safety, fall precautions per facility protocol. Provided  general support and encouragement, no other unmet needs identified at this time. Palliative care will continue to provided support to patient, family and medical team.  Follow up Palliative Care Visit: Palliative care will continue to follow for complex decision making and symptom management. Return in about 6-8 weeks or prn.  Family /Caregiver/Community Supports: Patient is a resident at [facility].  Cognitive / Functional decline:  Patient awake, alert and coherent. She is independent with her ADLs, ambulates with a Rolator walker. She is continent of bowel and bladder.  I spent 48 minutes providing this consultation, time includes time spent with patient, chart review, provider coordination, and documentation. More than 50% of the time in this consultation was spent counseling and coordinating communication.   CHIEF COMPLAINT: Burning and itching in vulva  History obtained from review of EMR, discussion with facilty staff, and interview with patient. Records reviewed and summarized bellow.  HISTORY OF PRESENT ILLNESS:  [name] is a 87 y.o. year old female with multiple medical problems including Afib not on anticoagulation, Type 2 diabetes, hypertension, arthritis, hx of CVA. Palliative Care was asked to follow this patient by consultation request of Ivy Lynn, NP to help address advance care planning and goals of care. This is an initial visit.  CODE STATUS: DNR  PPS: 50%  HOSPICE ELIGIBILITY/DIAGNOSIS: TBD  ROS   Constitutional: Denied fatigue, denied fever, denied chills EYES: denied acute vision changes ENMT: denied dysphagia Cardiovascular: denied chest pain, denied palpitation Pulmonary: denied cough, denied SOB Abdomen: endorses fair appetite, denied constipation, denied incontinence of bowel GU: denied dysuria, denied incontinence of urine MSK:  endorses ROM limitations, no falls reported Skin: denies rashes or wounds  Neurological:endorsed  generalized pain, denies insomnia Psych: Endorses positive mood Heme/lymph/immuno: denies bruises, abnormal bleeding   Physical Exam: Current and past weights: 131lbs up from 125lbs two months ago, BMI 22.48kg/m2 General: frail appearing, cooperative, lying in bed in NAD EYES: anicteric sclera, lids intact, no discharge  ENMT: intact hearing,oral mucous membranes moist CV:  no LE edema Pulmonary: no increased work of breathing, no cough, no audible wheezes, room air Abdomen:  no ascites GU: vulva and vaginal introitus erythematous and inflammed MSK: no contractures of LE, ambulatory Skin: warm and dry, no rashes or wounds on visible skin Neuro: Generalized weakness, A & O x 3 Psych: non-anxious affect today Hem/lymph/immuno: no widespread bruising   PAST MEDICAL HISTORY:  Past Medical History:  Diagnosis Date  . Arthritis   . Atrial fibrillation ([place])   . Cancer ([place])    breast  . Cataract   . Diabetes mellitus without complication ([place])   . Frequent UTI   . Hyperlipidemia   . Hypertension   . Neck pain   . Scoliosis   . Stroke [facility])     SOCIAL HX:  Social History   Tobacco Use  . Smoking status: Never Smoker  . Smokeless tobacco: Never Used  Substance Use Topics  . Alcohol use: No    Alcohol/week: 0.0 standard drinks   FAMILY HX:  Family History  Problem Relation Age of Onset  . Cancer Mother        lung  . Arthritis Mother   . Heart disease Father        endocarditis  . Cancer Brother        lung, throat  . Alcohol abuse Brother     ALLERGIES:  Allergies  Allergen Reactions  . Diltiazem Hcl Itching, Other (See Comments) and Rash    Red itchy rash started a few hours after taking short acting $RemoveBefo'120mg'ncLfUaYUaTN$  dilt tabs Red itchy rash started a few hours after taking short acting $RemoveBefo'120mg'CJMGvziHiGH$  dilt tabs Red itchy rash started a few hours after taking short acting $RemoveBefo'120mg'mYLTSArPUwQ$  dilt tabs  . Duloxetine Hcl Anxiety, Other (See Comments) and Rash    Makes patient feel "restless and  scared" and unable to sleep Makes patient feel "restless and scared" and unable to sleep Makes patient feel "restless and scared" and unable to sleep  . Ace Inhibitors Cough     PERTINENT MEDICATIONS:  Outpatient Encounter Medications as of 07/09/2020  Medication Sig  . acetaminophen (TYLENOL) 500 MG tablet Take 1 tablet (500 mg total) by mouth every 6 (six) hours as needed for moderate pain.  Marland Kitchen atorvastatin (LIPITOR) 80 MG tablet Take 1 tablet (80 mg total) by mouth at bedtime.  . blood glucose meter kit and supplies Dispense based on patient and insurance preference. Use up to four times daily as directed. (FOR ICD-10 E10.9, E11.9).accucheck  . cephALEXin (KEFLEX) 500 MG capsule Take 1 capsule (500 mg total) by mouth 2 (two) times daily. (Patient not taking: Reported on 04/09/2020)  . Cholecalciferol (VITAMIN D3) 50 MCG (2000 UT) TABS Take 2,000 Units by mouth in the morning.  . cloNIDine (CATAPRES) 0.1 MG tablet Take 1 tablet (0.1 mg total) by mouth 2 (two) times daily as needed (for Systolic over 161).  . DULoxetine (CYMBALTA) 20 MG capsule Take 1 capsule (20 mg total) by mouth daily.  . ferrous sulfate 325 (65 FE) MG tablet Take 1 tablet (325 mg total) by mouth daily with breakfast. (Patient not taking: Reported on 04/09/2020)  . glucose blood (  ONE TOUCH ULTRA TEST) test strip USE TO check blood sugars twice daily  . hydrALAZINE (APRESOLINE) 50 MG tablet Take 1 tablet (50 mg total) by mouth 3 (three) times daily.  . Insulin Pen Needle (PEN NEEDLES) 32G X 4 MM MISC Patient test bid  . Insulin Pen Needle 31G X 8 MM MISC Use once daily with lantus solostar  . Lancets (FREESTYLE) lancets Use as instructed  . LANTUS SOLOSTAR 100 UNIT/ML Solostar Pen INJECT 50 UNITS ONCE DAILY AT 10PM (Patient taking differently: Inject 10 Units into the skin at bedtime. )  . Magnesium 250 MG TABS Take 1 tablet (250 mg total) by mouth daily.  . meloxicam (MOBIC) 7.5 MG tablet Take 1 tablet (7.5 mg total) by mouth  daily. (Patient not taking: Reported on 04/09/2020)  . metFORMIN (GLUCOPHAGE) 500 MG tablet Take 1 tablet (500 mg total) by mouth daily with breakfast.  . NOVOLOG FLEXPEN 100 UNIT/ML FlexPen Inject 0-10 Units into the skin See admin instructions. Sliding scales 1 to 150=0 units, 151-200=2 units, 201-250=4 units, 251-300=6 units, 301-350=8 units, 351-400=10 units >400 call Dr < 70 call Dr  . omeprazole (PRILOSEC) 20 MG capsule TAKE  (1)  CAPSULE  TWICE DAILY (TAKE ON AN EMPTY STOMACH AT LEAST 30 MIN- UTES BEFORE MEALS).  Marland Kitchen ondansetron (ZOFRAN) 4 MG tablet Take 1 tablet (4 mg total) by mouth every 8 (eight) hours as needed for nausea or vomiting.  Marland Kitchen oxymetazoline (AFRIN) 0.05 % nasal spray Place 1 spray into both nostrils 2 (two) times daily.  . potassium chloride SA (KLOR-CON) 20 MEQ tablet Take 20 mEq by mouth daily.  Marland Kitchen rOPINIRole (REQUIP) 0.25 MG tablet Take 1 tablet (0.25 mg total) by mouth 3 (three) times daily. Take 0.25 mg by mouth two times a day and 0.25 mg at bedtime  . SUPER B COMPLEX/C PO Take 1 tablet by mouth daily.  Marland Kitchen tiZANidine (ZANAFLEX) 4 MG capsule Take 1 capsule (4 mg total) by mouth 3 (three) times daily as needed for muscle spasms. (Patient not taking: Reported on 04/09/2020)  . traMADol (ULTRAM) 50 MG tablet Take 50 mg by mouth 2 (two) times daily as needed.  . traZODone (DESYREL) 150 MG tablet Take 0.5 tablets (75 mg total) by mouth at bedtime.   No facility-administered encounter medications on file as of 07/09/2020.    Thank you for the opportunity to participate in the care of [name]. The palliative care team will continue to follow. Please call our office at [phone] if we can be of additional assistance.   Jari Favre, DNP, AGPCNP-BC

## 2020-07-10 DIAGNOSIS — E1169 Type 2 diabetes mellitus with other specified complication: Secondary | ICD-10-CM | POA: Diagnosis not present

## 2020-07-10 DIAGNOSIS — E1142 Type 2 diabetes mellitus with diabetic polyneuropathy: Secondary | ICD-10-CM | POA: Diagnosis not present

## 2020-07-10 DIAGNOSIS — I5032 Chronic diastolic (congestive) heart failure: Secondary | ICD-10-CM | POA: Diagnosis not present

## 2020-07-10 DIAGNOSIS — I739 Peripheral vascular disease, unspecified: Secondary | ICD-10-CM | POA: Diagnosis not present

## 2020-07-16 ENCOUNTER — Ambulatory Visit (INDEPENDENT_AMBULATORY_CARE_PROVIDER_SITE_OTHER): Payer: PPO | Admitting: Licensed Clinical Social Worker

## 2020-07-16 DIAGNOSIS — I63312 Cerebral infarction due to thrombosis of left middle cerebral artery: Secondary | ICD-10-CM | POA: Diagnosis not present

## 2020-07-16 DIAGNOSIS — M8949 Other hypertrophic osteoarthropathy, multiple sites: Secondary | ICD-10-CM | POA: Diagnosis not present

## 2020-07-16 DIAGNOSIS — I152 Hypertension secondary to endocrine disorders: Secondary | ICD-10-CM | POA: Diagnosis not present

## 2020-07-16 DIAGNOSIS — E785 Hyperlipidemia, unspecified: Secondary | ICD-10-CM

## 2020-07-16 DIAGNOSIS — M159 Polyosteoarthritis, unspecified: Secondary | ICD-10-CM

## 2020-07-16 DIAGNOSIS — E1140 Type 2 diabetes mellitus with diabetic neuropathy, unspecified: Secondary | ICD-10-CM | POA: Diagnosis not present

## 2020-07-16 DIAGNOSIS — E1169 Type 2 diabetes mellitus with other specified complication: Secondary | ICD-10-CM

## 2020-07-16 DIAGNOSIS — I5032 Chronic diastolic (congestive) heart failure: Secondary | ICD-10-CM | POA: Diagnosis not present

## 2020-07-16 DIAGNOSIS — E1159 Type 2 diabetes mellitus with other circulatory complications: Secondary | ICD-10-CM

## 2020-07-16 DIAGNOSIS — K219 Gastro-esophageal reflux disease without esophagitis: Secondary | ICD-10-CM

## 2020-07-16 DIAGNOSIS — Z794 Long term (current) use of insulin: Secondary | ICD-10-CM | POA: Diagnosis not present

## 2020-07-16 DIAGNOSIS — I4891 Unspecified atrial fibrillation: Secondary | ICD-10-CM

## 2020-07-16 NOTE — Patient Instructions (Signed)
Visit Information  PATIENT GOALS: Goals Addressed              This Visit's Progress   .  manage anxiety and stress issues faced daily (pt-stated)        Time Frame:  Short Term  Priority: Medium  This Visits Progress: On Track  Start Date:  07/16/2020  End Date:  10/16/2020  Follow Up Date: 08/16/2020    Patient Self Care Activities:  . Self administers medications as prescribed . Attends all scheduled provider appointments . Performs ADL's independently . Calls provider office for new concerns or questions  Patient Coping Strengths:  . Supportive Relationships . Family . Friends  Patient Self Care Deficits:  . Unable to perform IADLs independently . Mobility challenges  Patient Goals:  - spend time or talk with others every day - practice relaxation or meditation daily - keep a calendar with appointment dates  Follow Up Plan: [name] to call client on 08/16/2020 to assess client needs       Follow Up Plan:  [name] to call client on 08/16/2020 to assess client needs  Norva Riffle.[name] MSW, [name] Licensed Clinical Social Worker [facility] Care Management [phone]

## 2020-07-16 NOTE — Chronic Care Management (AMB) (Signed)
Chronic Care Management    Clinical Social Work Note  07/16/2020 Name: [name] MRN: [id number] DOB: 1925/06/23  [name] is a 87 y.o. year old female who is a primary care patient of Joan Lynn, NP. The CCM team was consulted to assist the patient with chronic disease management and/or care coordination needs related to: Mental Health Counseling and Resources.   Engaged with patient by telephone for follow up visit in response to provider referral for social work chronic care management and care coordination services.   Consent to Services:  The patient was given information about Chronic Care Management services, agreed to services, and gave verbal consent prior to initiation of services.  Please see initial visit note for detailed documentation.   Patient agreed to services and consent obtained.   Assessment: Review of patient past medical history, allergies, medications, and health status, including review of relevant consultants reports was performed today as part of a comprehensive evaluation and provision of chronic care management and care coordination services.     SDOH (Social Determinants of Health) assessments and interventions performed:    Advanced Directives Status: See Vynca application for related entries.  CCM Care Plan  Allergies  Allergen Reactions  . Diltiazem Hcl Itching, Other (See Comments) and Rash    Red itchy rash started a few hours after taking short acting $RemoveBefo'120mg'duZFXmAdhMg$  dilt tabs Red itchy rash started a few hours after taking short acting $RemoveBefo'120mg'TfXLSuJaeqp$  dilt tabs Red itchy rash started a few hours after taking short acting $RemoveBefo'120mg'fwshzLPaajt$  dilt tabs  . Duloxetine Hcl Anxiety, Other (See Comments) and Rash    Makes patient feel "restless and scared" and unable to sleep Makes patient feel "restless and scared" and unable to sleep Makes patient feel "restless and scared" and unable to sleep  . Ace Inhibitors Cough    Outpatient Encounter Medications as of  07/16/2020  Medication Sig  . acetaminophen (TYLENOL) 500 MG tablet Take 1 tablet (500 mg total) by mouth every 6 (six) hours as needed for moderate pain.  Marland Kitchen atorvastatin (LIPITOR) 80 MG tablet Take 1 tablet (80 mg total) by mouth at bedtime.  . blood glucose meter kit and supplies Dispense based on patient and insurance preference. Use up to four times daily as directed. (FOR ICD-10 E10.9, E11.9).accucheck  . cephALEXin (KEFLEX) 500 MG capsule Take 1 capsule (500 mg total) by mouth 2 (two) times daily. (Patient not taking: Reported on 04/09/2020)  . Cholecalciferol (VITAMIN D3) 50 MCG (2000 UT) TABS Take 2,000 Units by mouth in the morning.  . cloNIDine (CATAPRES) 0.1 MG tablet Take 1 tablet (0.1 mg total) by mouth 2 (two) times daily as needed (for Systolic over 761).  . DULoxetine (CYMBALTA) 20 MG capsule Take 1 capsule (20 mg total) by mouth daily.  . ferrous sulfate 325 (65 FE) MG tablet Take 1 tablet (325 mg total) by mouth daily with breakfast. (Patient not taking: Reported on 04/09/2020)  . glucose blood (ONE TOUCH ULTRA TEST) test strip USE TO check blood sugars twice daily  . hydrALAZINE (APRESOLINE) 50 MG tablet Take 1 tablet (50 mg total) by mouth 3 (three) times daily.  . Insulin Pen Needle (PEN NEEDLES) 32G X 4 MM MISC Patient test bid  . Insulin Pen Needle 31G X 8 MM MISC Use once daily with lantus solostar  . Lancets (FREESTYLE) lancets Use as instructed  . LANTUS SOLOSTAR 100 UNIT/ML Solostar Pen INJECT 50 UNITS ONCE DAILY AT 10PM (Patient taking differently: Inject 10  Units into the skin at bedtime. )  . Magnesium 250 MG TABS Take 1 tablet (250 mg total) by mouth daily.  . meloxicam (MOBIC) 7.5 MG tablet Take 1 tablet (7.5 mg total) by mouth daily. (Patient not taking: Reported on 04/09/2020)  . metFORMIN (GLUCOPHAGE) 500 MG tablet Take 1 tablet (500 mg total) by mouth daily with breakfast.  . NOVOLOG FLEXPEN 100 UNIT/ML FlexPen Inject 0-10 Units into the skin See admin instructions.  Sliding scales 1 to 150=0 units, 151-200=2 units, 201-250=4 units, 251-300=6 units, 301-350=8 units, 351-400=10 units >400 call Dr < 70 call Dr  . omeprazole (PRILOSEC) 20 MG capsule TAKE  (1)  CAPSULE  TWICE DAILY (TAKE ON AN EMPTY STOMACH AT LEAST 30 MIN- UTES BEFORE MEALS).  Marland Kitchen ondansetron (ZOFRAN) 4 MG tablet Take 1 tablet (4 mg total) by mouth every 8 (eight) hours as needed for nausea or vomiting.  Marland Kitchen oxymetazoline (AFRIN) 0.05 % nasal spray Place 1 spray into both nostrils 2 (two) times daily.  . potassium chloride SA (KLOR-CON) 20 MEQ tablet Take 20 mEq by mouth daily.  Marland Kitchen rOPINIRole (REQUIP) 0.25 MG tablet Take 1 tablet (0.25 mg total) by mouth 3 (three) times daily. Take 0.25 mg by mouth two times a day and 0.25 mg at bedtime  . SUPER B COMPLEX/C PO Take 1 tablet by mouth daily.  Marland Kitchen tiZANidine (ZANAFLEX) 4 MG capsule Take 1 capsule (4 mg total) by mouth 3 (three) times daily as needed for muscle spasms. (Patient not taking: Reported on 04/09/2020)  . traMADol (ULTRAM) 50 MG tablet Take 50 mg by mouth 2 (two) times daily as needed.  . traZODone (DESYREL) 150 MG tablet Take 0.5 tablets (75 mg total) by mouth at bedtime.   No facility-administered encounter medications on file as of 07/16/2020.    Patient Active Problem List   Diagnosis Date Noted  . Encounter to establish care 02/20/2020  . Bilateral subdural hematomas ([place]) 07/16/2019  . CHI (closed head injury), initial encounter 07/14/2019  . Left radial head fracture 07/14/2019  . SDH (subdural hematoma) ([place])   . Hypertensive urgency   . Subarachnoid hemorrhage following injury ([place]) 07/13/2019  . Acute encephalopathy 06/27/2019  . Heat stroke 06/27/2019  . Hypokalemia 06/27/2019  . Fever, unspecified 06/27/2019  . Stroke ([place]) 06/27/2019  . Altered mental status 06/26/2019  . Right hip pain 06/22/2019  . Chronic left hip pain 05/25/2019  . Depression, recurrent ([place]) 05/25/2019  . Chronic anticoagulation 04/19/2019  . Leg  cramps 03/04/2019  . Short-term memory loss 03/04/2019  . DOE (dyspnea on exertion) 03/04/2019  . Aortic atherosclerosis ([place]) 03/04/2019  . Physical deconditioning 01/24/2019  . History of recurrent UTIs 12/03/2018  . Chronic pain of right knee 10/08/2018  . Type 2 diabetes mellitus with diabetic neuropathy, unspecified ([place]) 08/12/2018  . Pulmonary vascular congestion   . Chronic diastolic CHF (congestive heart failure) ([place])   . Acute hypoxemic respiratory failure ([place]) 12/06/2017  . Permanent atrial fibrillation ([place]) 12/06/2017  . Trochanteric bursitis, right hip 07/16/2017  . Other intervertebral disc degeneration, lumbar region 07/16/2017  . Osteoporosis 11/04/2016  . Diabetic polyneuropathy associated with type 2 diabetes mellitus ([place]) 11/14/2014  . Fibromyalgia 02/11/2013  . CVA (cerebral vascular accident) ([place]) 08/01/2008  . Osteoarthritis 04/09/2007  . Hyperlipidemia associated with type 2 diabetes mellitus ([place]) 03/08/2007  . Hypertension associated with diabetes ([place]) 03/08/2007  . Pulmonary fibrosis ([place]) 03/08/2007  . GERD 03/08/2007  . BREAST CANCER, HX OF 03/08/2007    Conditions  to be addressed/monitored: Anxiety and stress issues faced by client  Care Plan : General Social Work (Adult)  Updates made by Katha Cabal, [name] since 07/16/2020 12:00 AM    Problem: Coping Skills (General Plan of Care)     Goal: Wants to complete ADLs daily and wants to manage emotions/anxiety over ongoing health concerns   Start Date: 07/16/2020  Expected End Date: 10/16/2020  This Visit's Progress: On track  Priority: Medium  Note:   Current Barriers:  . Chronic Mental Health needs related to anxiety and stress management . Mobility challenges . ADLs completion challenges . Suicidal Ideation/Homicidal Ideation: No  Clinical Social Work Goal(s):  . patient will work with SW monthly by telephone or in person to reduce or manage symptoms related to anxiety and stress  issues faced . Patient will complete ADLs daily for next 30 days . Patient will communicate as needed with RNCM or [name] for CCM support  Interventions: 1:1 collaboration with Joan Lynn, NP regarding development and update of comprehensive plan of cafe as evidenced by provider attestation and co-signature Talked with [name], son of client, about client mood and client strategies for managing stress and anxiety symptoms Talked with [name] about medication procurement for client Talked with [name] about mobility of client (generally using a wheelchair for mobility) Talked with [name] about care client receives at [facility] in [place], [place] Talked with [name] about appetite of client Talked with [name] about client participation in facility activities Talked with [name] about family support (client enjoys phone calls with family members) Talked with [name] about upcoming client appointments Encouraged [name] or client to contact RNCM or [name] as needed for CCM support   Patient Self Care Activities:  . Self administers medications as prescribed . Attends all scheduled provider appointments . Performs ADL's independently . Calls provider office for new concerns or questions  Patient Coping Strengths:  . Supportive Relationships . Family . Friends  Patient Self Care Deficits:  . Unable to perform IADLs independently . Mobility challenges  Patient Goals:  - spend time or talk with others every day - practice relaxation or meditation daily - keep a calendar with appointment dates  Follow Up Plan: [name] to call client on 08/16/2020 to assess client needs       Norva Riffle.Catilyn Boggus MSW, [name] Licensed Clinical Social Worker [facility] Care Management [phone]

## 2020-07-19 DIAGNOSIS — B373 Candidiasis of vulva and vagina: Secondary | ICD-10-CM | POA: Diagnosis not present

## 2020-07-19 DIAGNOSIS — M797 Fibromyalgia: Secondary | ICD-10-CM | POA: Diagnosis not present

## 2020-07-19 DIAGNOSIS — F339 Major depressive disorder, recurrent, unspecified: Secondary | ICD-10-CM | POA: Diagnosis not present

## 2020-07-19 DIAGNOSIS — E1142 Type 2 diabetes mellitus with diabetic polyneuropathy: Secondary | ICD-10-CM | POA: Diagnosis not present

## 2020-07-19 DIAGNOSIS — J302 Other seasonal allergic rhinitis: Secondary | ICD-10-CM | POA: Diagnosis not present

## 2020-07-19 DIAGNOSIS — L602 Onychogryphosis: Secondary | ICD-10-CM | POA: Diagnosis not present

## 2020-07-23 DIAGNOSIS — B373 Candidiasis of vulva and vagina: Secondary | ICD-10-CM | POA: Diagnosis not present

## 2020-07-23 DIAGNOSIS — K5900 Constipation, unspecified: Secondary | ICD-10-CM | POA: Diagnosis not present

## 2020-07-23 DIAGNOSIS — R5381 Other malaise: Secondary | ICD-10-CM | POA: Diagnosis not present

## 2020-07-23 DIAGNOSIS — I4891 Unspecified atrial fibrillation: Secondary | ICD-10-CM | POA: Diagnosis not present

## 2020-07-23 DIAGNOSIS — K219 Gastro-esophageal reflux disease without esophagitis: Secondary | ICD-10-CM | POA: Diagnosis not present

## 2020-07-23 DIAGNOSIS — G8929 Other chronic pain: Secondary | ICD-10-CM | POA: Diagnosis not present

## 2020-07-23 DIAGNOSIS — D508 Other iron deficiency anemias: Secondary | ICD-10-CM | POA: Diagnosis not present

## 2020-07-23 DIAGNOSIS — N760 Acute vaginitis: Secondary | ICD-10-CM | POA: Diagnosis not present

## 2020-07-23 DIAGNOSIS — I10 Essential (primary) hypertension: Secondary | ICD-10-CM | POA: Diagnosis not present

## 2020-07-24 DIAGNOSIS — B373 Candidiasis of vulva and vagina: Secondary | ICD-10-CM | POA: Diagnosis not present

## 2020-07-25 DIAGNOSIS — B373 Candidiasis of vulva and vagina: Secondary | ICD-10-CM | POA: Diagnosis not present

## 2020-07-25 DIAGNOSIS — Z9181 History of falling: Secondary | ICD-10-CM | POA: Diagnosis not present

## 2020-07-30 DIAGNOSIS — N3020 Other chronic cystitis without hematuria: Secondary | ICD-10-CM | POA: Diagnosis not present

## 2020-07-30 DIAGNOSIS — R3914 Feeling of incomplete bladder emptying: Secondary | ICD-10-CM | POA: Diagnosis not present

## 2020-07-30 DIAGNOSIS — K5900 Constipation, unspecified: Secondary | ICD-10-CM | POA: Diagnosis not present

## 2020-07-30 DIAGNOSIS — I10 Essential (primary) hypertension: Secondary | ICD-10-CM | POA: Diagnosis not present

## 2020-07-30 DIAGNOSIS — B373 Candidiasis of vulva and vagina: Secondary | ICD-10-CM | POA: Diagnosis not present

## 2020-07-30 DIAGNOSIS — R040 Epistaxis: Secondary | ICD-10-CM | POA: Diagnosis not present

## 2020-07-30 DIAGNOSIS — N952 Postmenopausal atrophic vaginitis: Secondary | ICD-10-CM | POA: Diagnosis not present

## 2020-07-30 DIAGNOSIS — B009 Herpesviral infection, unspecified: Secondary | ICD-10-CM | POA: Diagnosis not present

## 2020-07-30 DIAGNOSIS — N760 Acute vaginitis: Secondary | ICD-10-CM | POA: Diagnosis not present

## 2020-07-30 DIAGNOSIS — I4891 Unspecified atrial fibrillation: Secondary | ICD-10-CM | POA: Diagnosis not present

## 2020-07-30 DIAGNOSIS — I739 Peripheral vascular disease, unspecified: Secondary | ICD-10-CM | POA: Diagnosis not present

## 2020-07-30 DIAGNOSIS — G4700 Insomnia, unspecified: Secondary | ICD-10-CM | POA: Diagnosis not present

## 2020-07-30 DIAGNOSIS — R300 Dysuria: Secondary | ICD-10-CM | POA: Diagnosis not present

## 2020-08-02 DIAGNOSIS — M79676 Pain in unspecified toe(s): Secondary | ICD-10-CM | POA: Diagnosis not present

## 2020-08-02 DIAGNOSIS — E1142 Type 2 diabetes mellitus with diabetic polyneuropathy: Secondary | ICD-10-CM | POA: Diagnosis not present

## 2020-08-02 DIAGNOSIS — B351 Tinea unguium: Secondary | ICD-10-CM | POA: Diagnosis not present

## 2020-08-02 DIAGNOSIS — L84 Corns and callosities: Secondary | ICD-10-CM | POA: Diagnosis not present

## 2020-08-03 DIAGNOSIS — E1142 Type 2 diabetes mellitus with diabetic polyneuropathy: Secondary | ICD-10-CM | POA: Diagnosis not present

## 2020-08-03 DIAGNOSIS — I69398 Other sequelae of cerebral infarction: Secondary | ICD-10-CM | POA: Diagnosis not present

## 2020-08-03 DIAGNOSIS — R41841 Cognitive communication deficit: Secondary | ICD-10-CM | POA: Diagnosis not present

## 2020-08-08 DIAGNOSIS — E119 Type 2 diabetes mellitus without complications: Secondary | ICD-10-CM | POA: Diagnosis not present

## 2020-08-08 DIAGNOSIS — I4891 Unspecified atrial fibrillation: Secondary | ICD-10-CM | POA: Diagnosis not present

## 2020-08-08 DIAGNOSIS — N390 Urinary tract infection, site not specified: Secondary | ICD-10-CM | POA: Diagnosis not present

## 2020-08-08 DIAGNOSIS — F329 Major depressive disorder, single episode, unspecified: Secondary | ICD-10-CM | POA: Diagnosis not present

## 2020-08-08 DIAGNOSIS — N952 Postmenopausal atrophic vaginitis: Secondary | ICD-10-CM | POA: Diagnosis not present

## 2020-08-08 DIAGNOSIS — D508 Other iron deficiency anemias: Secondary | ICD-10-CM | POA: Diagnosis not present

## 2020-08-08 DIAGNOSIS — G8929 Other chronic pain: Secondary | ICD-10-CM | POA: Diagnosis not present

## 2020-08-08 DIAGNOSIS — I10 Essential (primary) hypertension: Secondary | ICD-10-CM | POA: Diagnosis not present

## 2020-08-08 DIAGNOSIS — K219 Gastro-esophageal reflux disease without esophagitis: Secondary | ICD-10-CM | POA: Diagnosis not present

## 2020-08-13 DIAGNOSIS — K5900 Constipation, unspecified: Secondary | ICD-10-CM | POA: Diagnosis not present

## 2020-08-13 DIAGNOSIS — I739 Peripheral vascular disease, unspecified: Secondary | ICD-10-CM | POA: Diagnosis not present

## 2020-08-13 DIAGNOSIS — E119 Type 2 diabetes mellitus without complications: Secondary | ICD-10-CM | POA: Diagnosis not present

## 2020-08-13 DIAGNOSIS — R600 Localized edema: Secondary | ICD-10-CM | POA: Diagnosis not present

## 2020-08-13 DIAGNOSIS — N390 Urinary tract infection, site not specified: Secondary | ICD-10-CM | POA: Diagnosis not present

## 2020-08-13 DIAGNOSIS — I10 Essential (primary) hypertension: Secondary | ICD-10-CM | POA: Diagnosis not present

## 2020-08-13 DIAGNOSIS — F329 Major depressive disorder, single episode, unspecified: Secondary | ICD-10-CM | POA: Diagnosis not present

## 2020-08-13 DIAGNOSIS — M1990 Unspecified osteoarthritis, unspecified site: Secondary | ICD-10-CM | POA: Diagnosis not present

## 2020-08-13 DIAGNOSIS — R601 Generalized edema: Secondary | ICD-10-CM | POA: Diagnosis not present

## 2020-08-13 DIAGNOSIS — R5381 Other malaise: Secondary | ICD-10-CM | POA: Diagnosis not present

## 2020-08-13 DIAGNOSIS — N952 Postmenopausal atrophic vaginitis: Secondary | ICD-10-CM | POA: Diagnosis not present

## 2020-08-13 DIAGNOSIS — K219 Gastro-esophageal reflux disease without esophagitis: Secondary | ICD-10-CM | POA: Diagnosis not present

## 2020-08-16 ENCOUNTER — Telehealth: Payer: PPO

## 2020-08-16 DIAGNOSIS — I10 Essential (primary) hypertension: Secondary | ICD-10-CM | POA: Diagnosis not present

## 2020-08-16 DIAGNOSIS — G4700 Insomnia, unspecified: Secondary | ICD-10-CM | POA: Diagnosis not present

## 2020-08-16 DIAGNOSIS — R600 Localized edema: Secondary | ICD-10-CM | POA: Diagnosis not present

## 2020-08-16 DIAGNOSIS — R040 Epistaxis: Secondary | ICD-10-CM | POA: Diagnosis not present

## 2020-08-16 DIAGNOSIS — I4891 Unspecified atrial fibrillation: Secondary | ICD-10-CM | POA: Diagnosis not present

## 2020-08-16 DIAGNOSIS — N952 Postmenopausal atrophic vaginitis: Secondary | ICD-10-CM | POA: Diagnosis not present

## 2020-08-20 DIAGNOSIS — M1990 Unspecified osteoarthritis, unspecified site: Secondary | ICD-10-CM | POA: Diagnosis not present

## 2020-08-20 DIAGNOSIS — M79672 Pain in left foot: Secondary | ICD-10-CM | POA: Diagnosis not present

## 2020-08-20 DIAGNOSIS — D508 Other iron deficiency anemias: Secondary | ICD-10-CM | POA: Diagnosis not present

## 2020-08-20 DIAGNOSIS — M79662 Pain in left lower leg: Secondary | ICD-10-CM | POA: Diagnosis not present

## 2020-08-20 DIAGNOSIS — N952 Postmenopausal atrophic vaginitis: Secondary | ICD-10-CM | POA: Diagnosis not present

## 2020-08-20 DIAGNOSIS — E559 Vitamin D deficiency, unspecified: Secondary | ICD-10-CM | POA: Diagnosis not present

## 2020-08-20 DIAGNOSIS — N390 Urinary tract infection, site not specified: Secondary | ICD-10-CM | POA: Diagnosis not present

## 2020-08-20 DIAGNOSIS — B372 Candidiasis of skin and nail: Secondary | ICD-10-CM | POA: Diagnosis not present

## 2020-08-20 DIAGNOSIS — I10 Essential (primary) hypertension: Secondary | ICD-10-CM | POA: Diagnosis not present

## 2020-08-20 DIAGNOSIS — R600 Localized edema: Secondary | ICD-10-CM | POA: Diagnosis not present

## 2020-08-20 DIAGNOSIS — M79605 Pain in left leg: Secondary | ICD-10-CM | POA: Diagnosis not present

## 2020-08-20 DIAGNOSIS — K219 Gastro-esophageal reflux disease without esophagitis: Secondary | ICD-10-CM | POA: Diagnosis not present

## 2020-08-20 DIAGNOSIS — I4891 Unspecified atrial fibrillation: Secondary | ICD-10-CM | POA: Diagnosis not present

## 2020-08-20 DIAGNOSIS — M25572 Pain in left ankle and joints of left foot: Secondary | ICD-10-CM | POA: Diagnosis not present

## 2020-08-30 DIAGNOSIS — B373 Candidiasis of vulva and vagina: Secondary | ICD-10-CM | POA: Diagnosis not present

## 2020-08-30 DIAGNOSIS — N760 Acute vaginitis: Secondary | ICD-10-CM | POA: Diagnosis not present

## 2020-09-02 DIAGNOSIS — E1142 Type 2 diabetes mellitus with diabetic polyneuropathy: Secondary | ICD-10-CM | POA: Diagnosis not present

## 2020-09-02 DIAGNOSIS — R41841 Cognitive communication deficit: Secondary | ICD-10-CM | POA: Diagnosis not present

## 2020-09-02 DIAGNOSIS — I69398 Other sequelae of cerebral infarction: Secondary | ICD-10-CM | POA: Diagnosis not present

## 2020-09-03 DIAGNOSIS — K219 Gastro-esophageal reflux disease without esophagitis: Secondary | ICD-10-CM | POA: Diagnosis not present

## 2020-09-03 DIAGNOSIS — R600 Localized edema: Secondary | ICD-10-CM | POA: Diagnosis not present

## 2020-09-03 DIAGNOSIS — N952 Postmenopausal atrophic vaginitis: Secondary | ICD-10-CM | POA: Diagnosis not present

## 2020-09-03 DIAGNOSIS — E559 Vitamin D deficiency, unspecified: Secondary | ICD-10-CM | POA: Diagnosis not present

## 2020-09-03 DIAGNOSIS — E119 Type 2 diabetes mellitus without complications: Secondary | ICD-10-CM | POA: Diagnosis not present

## 2020-09-03 DIAGNOSIS — K5900 Constipation, unspecified: Secondary | ICD-10-CM | POA: Diagnosis not present

## 2020-09-03 DIAGNOSIS — M1990 Unspecified osteoarthritis, unspecified site: Secondary | ICD-10-CM | POA: Diagnosis not present

## 2020-09-03 DIAGNOSIS — B372 Candidiasis of skin and nail: Secondary | ICD-10-CM | POA: Diagnosis not present

## 2020-09-03 DIAGNOSIS — G4700 Insomnia, unspecified: Secondary | ICD-10-CM | POA: Diagnosis not present

## 2020-09-03 DIAGNOSIS — R5381 Other malaise: Secondary | ICD-10-CM | POA: Diagnosis not present

## 2020-09-03 DIAGNOSIS — F329 Major depressive disorder, single episode, unspecified: Secondary | ICD-10-CM | POA: Diagnosis not present

## 2020-09-07 DIAGNOSIS — E789 Disorder of lipoprotein metabolism, unspecified: Secondary | ICD-10-CM | POA: Diagnosis not present

## 2020-09-07 DIAGNOSIS — E559 Vitamin D deficiency, unspecified: Secondary | ICD-10-CM | POA: Diagnosis not present

## 2020-09-07 DIAGNOSIS — E329 Disease of thymus, unspecified: Secondary | ICD-10-CM | POA: Diagnosis not present

## 2020-09-13 DIAGNOSIS — B373 Candidiasis of vulva and vagina: Secondary | ICD-10-CM | POA: Diagnosis not present

## 2020-09-19 ENCOUNTER — Telehealth: Payer: PPO

## 2020-09-27 DIAGNOSIS — K5900 Constipation, unspecified: Secondary | ICD-10-CM | POA: Diagnosis not present

## 2020-09-27 DIAGNOSIS — B372 Candidiasis of skin and nail: Secondary | ICD-10-CM | POA: Diagnosis not present

## 2020-09-27 DIAGNOSIS — N952 Postmenopausal atrophic vaginitis: Secondary | ICD-10-CM | POA: Diagnosis not present

## 2020-09-27 DIAGNOSIS — E119 Type 2 diabetes mellitus without complications: Secondary | ICD-10-CM | POA: Diagnosis not present

## 2020-09-27 DIAGNOSIS — R040 Epistaxis: Secondary | ICD-10-CM | POA: Diagnosis not present

## 2020-09-27 DIAGNOSIS — R600 Localized edema: Secondary | ICD-10-CM | POA: Diagnosis not present

## 2020-09-27 DIAGNOSIS — E1142 Type 2 diabetes mellitus with diabetic polyneuropathy: Secondary | ICD-10-CM | POA: Diagnosis not present

## 2020-09-27 DIAGNOSIS — K219 Gastro-esophageal reflux disease without esophagitis: Secondary | ICD-10-CM | POA: Diagnosis not present

## 2020-09-27 DIAGNOSIS — I4891 Unspecified atrial fibrillation: Secondary | ICD-10-CM | POA: Diagnosis not present

## 2020-09-27 DIAGNOSIS — G4700 Insomnia, unspecified: Secondary | ICD-10-CM | POA: Diagnosis not present

## 2020-10-03 DIAGNOSIS — E1142 Type 2 diabetes mellitus with diabetic polyneuropathy: Secondary | ICD-10-CM | POA: Diagnosis not present

## 2020-10-03 DIAGNOSIS — R41841 Cognitive communication deficit: Secondary | ICD-10-CM | POA: Diagnosis not present

## 2020-10-03 DIAGNOSIS — I69398 Other sequelae of cerebral infarction: Secondary | ICD-10-CM | POA: Diagnosis not present

## 2020-10-08 DIAGNOSIS — R1310 Dysphagia, unspecified: Secondary | ICD-10-CM | POA: Diagnosis not present

## 2020-10-08 DIAGNOSIS — E119 Type 2 diabetes mellitus without complications: Secondary | ICD-10-CM | POA: Diagnosis not present

## 2020-10-08 DIAGNOSIS — D508 Other iron deficiency anemias: Secondary | ICD-10-CM | POA: Diagnosis not present

## 2020-10-08 DIAGNOSIS — E559 Vitamin D deficiency, unspecified: Secondary | ICD-10-CM | POA: Diagnosis not present

## 2020-10-08 DIAGNOSIS — R5381 Other malaise: Secondary | ICD-10-CM | POA: Diagnosis not present

## 2020-10-08 DIAGNOSIS — K219 Gastro-esophageal reflux disease without esophagitis: Secondary | ICD-10-CM | POA: Diagnosis not present

## 2020-10-08 DIAGNOSIS — I509 Heart failure, unspecified: Secondary | ICD-10-CM | POA: Diagnosis not present

## 2020-10-08 DIAGNOSIS — B372 Candidiasis of skin and nail: Secondary | ICD-10-CM | POA: Diagnosis not present

## 2020-10-08 DIAGNOSIS — K5900 Constipation, unspecified: Secondary | ICD-10-CM | POA: Diagnosis not present

## 2020-10-08 DIAGNOSIS — I4891 Unspecified atrial fibrillation: Secondary | ICD-10-CM | POA: Diagnosis not present

## 2020-10-08 DIAGNOSIS — N952 Postmenopausal atrophic vaginitis: Secondary | ICD-10-CM | POA: Diagnosis not present

## 2020-10-08 DIAGNOSIS — I10 Essential (primary) hypertension: Secondary | ICD-10-CM | POA: Diagnosis not present

## 2020-10-09 DIAGNOSIS — Z7901 Long term (current) use of anticoagulants: Secondary | ICD-10-CM | POA: Diagnosis not present

## 2020-10-09 DIAGNOSIS — E1169 Type 2 diabetes mellitus with other specified complication: Secondary | ICD-10-CM | POA: Diagnosis not present

## 2020-10-11 DIAGNOSIS — M79675 Pain in left toe(s): Secondary | ICD-10-CM | POA: Diagnosis not present

## 2020-10-12 DIAGNOSIS — M79675 Pain in left toe(s): Secondary | ICD-10-CM | POA: Diagnosis not present

## 2020-10-17 DIAGNOSIS — E559 Vitamin D deficiency, unspecified: Secondary | ICD-10-CM | POA: Diagnosis not present

## 2020-10-17 DIAGNOSIS — E119 Type 2 diabetes mellitus without complications: Secondary | ICD-10-CM | POA: Diagnosis not present

## 2020-10-17 DIAGNOSIS — M1990 Unspecified osteoarthritis, unspecified site: Secondary | ICD-10-CM | POA: Diagnosis not present

## 2020-10-17 DIAGNOSIS — M79672 Pain in left foot: Secondary | ICD-10-CM | POA: Diagnosis not present

## 2020-10-17 DIAGNOSIS — R600 Localized edema: Secondary | ICD-10-CM | POA: Diagnosis not present

## 2020-10-17 DIAGNOSIS — R040 Epistaxis: Secondary | ICD-10-CM | POA: Diagnosis not present

## 2020-10-17 DIAGNOSIS — G4700 Insomnia, unspecified: Secondary | ICD-10-CM | POA: Diagnosis not present

## 2020-10-17 DIAGNOSIS — M7989 Other specified soft tissue disorders: Secondary | ICD-10-CM | POA: Diagnosis not present

## 2020-10-17 DIAGNOSIS — K219 Gastro-esophageal reflux disease without esophagitis: Secondary | ICD-10-CM | POA: Diagnosis not present

## 2020-10-17 DIAGNOSIS — R5381 Other malaise: Secondary | ICD-10-CM | POA: Diagnosis not present

## 2020-10-18 DIAGNOSIS — M25572 Pain in left ankle and joints of left foot: Secondary | ICD-10-CM | POA: Diagnosis not present

## 2020-10-18 DIAGNOSIS — G4720 Circadian rhythm sleep disorder, unspecified type: Secondary | ICD-10-CM | POA: Diagnosis not present

## 2020-10-18 DIAGNOSIS — M79672 Pain in left foot: Secondary | ICD-10-CM | POA: Diagnosis not present

## 2020-10-18 DIAGNOSIS — G8929 Other chronic pain: Secondary | ICD-10-CM | POA: Diagnosis not present

## 2020-10-18 DIAGNOSIS — F329 Major depressive disorder, single episode, unspecified: Secondary | ICD-10-CM | POA: Diagnosis not present

## 2020-10-22 DIAGNOSIS — E559 Vitamin D deficiency, unspecified: Secondary | ICD-10-CM | POA: Diagnosis not present

## 2020-10-22 DIAGNOSIS — M7989 Other specified soft tissue disorders: Secondary | ICD-10-CM | POA: Diagnosis not present

## 2020-10-22 DIAGNOSIS — K5900 Constipation, unspecified: Secondary | ICD-10-CM | POA: Diagnosis not present

## 2020-10-22 DIAGNOSIS — I10 Essential (primary) hypertension: Secondary | ICD-10-CM | POA: Diagnosis not present

## 2020-10-22 DIAGNOSIS — R600 Localized edema: Secondary | ICD-10-CM | POA: Diagnosis not present

## 2020-10-22 DIAGNOSIS — I4891 Unspecified atrial fibrillation: Secondary | ICD-10-CM | POA: Diagnosis not present

## 2020-10-22 DIAGNOSIS — G4700 Insomnia, unspecified: Secondary | ICD-10-CM | POA: Diagnosis not present

## 2020-10-22 DIAGNOSIS — R5381 Other malaise: Secondary | ICD-10-CM | POA: Diagnosis not present

## 2020-10-22 DIAGNOSIS — R040 Epistaxis: Secondary | ICD-10-CM | POA: Diagnosis not present

## 2020-10-22 DIAGNOSIS — M1990 Unspecified osteoarthritis, unspecified site: Secondary | ICD-10-CM | POA: Diagnosis not present

## 2020-10-22 DIAGNOSIS — M79672 Pain in left foot: Secondary | ICD-10-CM | POA: Diagnosis not present

## 2020-10-24 ENCOUNTER — Encounter (HOSPITAL_COMMUNITY): Payer: Self-pay

## 2020-10-24 ENCOUNTER — Emergency Department (HOSPITAL_COMMUNITY)
Admission: EM | Admit: 2020-10-24 | Discharge: 2020-10-24 | Disposition: A | Discharge status: Home / Self Care | Payer: PPO | Attending: Emergency Medicine | Admitting: Emergency Medicine

## 2020-10-24 ENCOUNTER — Emergency Department (HOSPITAL_COMMUNITY): Payer: PPO

## 2020-10-24 DIAGNOSIS — S99922A Unspecified injury of left foot, initial encounter: Secondary | ICD-10-CM | POA: Diagnosis present

## 2020-10-24 DIAGNOSIS — S0101XA Laceration without foreign body of scalp, initial encounter: Secondary | ICD-10-CM | POA: Diagnosis not present

## 2020-10-24 DIAGNOSIS — S0003XA Contusion of scalp, initial encounter: Secondary | ICD-10-CM | POA: Diagnosis not present

## 2020-10-24 DIAGNOSIS — S92415A Nondisplaced fracture of proximal phalanx of left great toe, initial encounter for closed fracture: Secondary | ICD-10-CM | POA: Diagnosis not present

## 2020-10-24 DIAGNOSIS — S92492A Other fracture of left great toe, initial encounter for closed fracture: Secondary | ICD-10-CM

## 2020-10-24 DIAGNOSIS — R296 Repeated falls: Secondary | ICD-10-CM | POA: Diagnosis not present

## 2020-10-24 DIAGNOSIS — W01198A Fall on same level from slipping, tripping and stumbling with subsequent striking against other object, initial encounter: Secondary | ICD-10-CM | POA: Diagnosis not present

## 2020-10-24 DIAGNOSIS — Z79899 Other long term (current) drug therapy: Secondary | ICD-10-CM | POA: Insufficient documentation

## 2020-10-24 DIAGNOSIS — Z853 Personal history of malignant neoplasm of breast: Secondary | ICD-10-CM | POA: Insufficient documentation

## 2020-10-24 DIAGNOSIS — W19XXXA Unspecified fall, initial encounter: Secondary | ICD-10-CM | POA: Diagnosis not present

## 2020-10-24 DIAGNOSIS — I4891 Unspecified atrial fibrillation: Secondary | ICD-10-CM | POA: Diagnosis not present

## 2020-10-24 DIAGNOSIS — E559 Vitamin D deficiency, unspecified: Secondary | ICD-10-CM | POA: Diagnosis not present

## 2020-10-24 DIAGNOSIS — Z96653 Presence of artificial knee joint, bilateral: Secondary | ICD-10-CM | POA: Insufficient documentation

## 2020-10-24 DIAGNOSIS — R5381 Other malaise: Secondary | ICD-10-CM | POA: Diagnosis not present

## 2020-10-24 DIAGNOSIS — I10 Essential (primary) hypertension: Secondary | ICD-10-CM | POA: Insufficient documentation

## 2020-10-24 DIAGNOSIS — E119 Type 2 diabetes mellitus without complications: Secondary | ICD-10-CM | POA: Diagnosis not present

## 2020-10-24 DIAGNOSIS — R69 Illness, unspecified: Secondary | ICD-10-CM | POA: Diagnosis not present

## 2020-10-24 DIAGNOSIS — E1169 Type 2 diabetes mellitus with other specified complication: Secondary | ICD-10-CM | POA: Insufficient documentation

## 2020-10-24 DIAGNOSIS — G4700 Insomnia, unspecified: Secondary | ICD-10-CM | POA: Diagnosis not present

## 2020-10-24 DIAGNOSIS — E1136 Type 2 diabetes mellitus with diabetic cataract: Secondary | ICD-10-CM | POA: Diagnosis not present

## 2020-10-24 DIAGNOSIS — M79672 Pain in left foot: Secondary | ICD-10-CM | POA: Diagnosis not present

## 2020-10-24 DIAGNOSIS — Z7984 Long term (current) use of oral hypoglycemic drugs: Secondary | ICD-10-CM | POA: Diagnosis not present

## 2020-10-24 DIAGNOSIS — E785 Hyperlipidemia, unspecified: Secondary | ICD-10-CM | POA: Diagnosis not present

## 2020-10-24 DIAGNOSIS — K5900 Constipation, unspecified: Secondary | ICD-10-CM | POA: Diagnosis not present

## 2020-10-24 DIAGNOSIS — M25552 Pain in left hip: Secondary | ICD-10-CM | POA: Diagnosis not present

## 2020-10-24 DIAGNOSIS — Z794 Long term (current) use of insulin: Secondary | ICD-10-CM | POA: Insufficient documentation

## 2020-10-24 DIAGNOSIS — S199XXA Unspecified injury of neck, initial encounter: Secondary | ICD-10-CM | POA: Diagnosis not present

## 2020-10-24 DIAGNOSIS — S0990XA Unspecified injury of head, initial encounter: Secondary | ICD-10-CM

## 2020-10-24 DIAGNOSIS — K219 Gastro-esophageal reflux disease without esophagitis: Secondary | ICD-10-CM | POA: Diagnosis not present

## 2020-10-24 DIAGNOSIS — M47812 Spondylosis without myelopathy or radiculopathy, cervical region: Secondary | ICD-10-CM | POA: Diagnosis not present

## 2020-10-24 LAB — URINALYSIS, ROUTINE W REFLEX MICROSCOPIC
Bacteria, UA: NONE SEEN
Bilirubin Urine: NEGATIVE
Glucose, UA: NEGATIVE mg/dL
Hgb urine dipstick: NEGATIVE
Ketones, ur: NEGATIVE mg/dL
Nitrite: NEGATIVE
Protein, ur: NEGATIVE mg/dL
Specific Gravity, Urine: 1.012 (ref 1.005–1.030)
pH: 7 (ref 5.0–8.0)

## 2020-10-24 LAB — CBC
HCT: 43.1 % (ref 36.0–46.0)
Hemoglobin: 13.9 g/dL (ref 12.0–15.0)
MCH: 28.4 pg (ref 26.0–34.0)
MCHC: 32.3 g/dL (ref 30.0–36.0)
MCV: 88 fL (ref 80.0–100.0)
Platelets: 190 10*3/uL (ref 150–400)
RBC: 4.9 MIL/uL (ref 3.87–5.11)
RDW: 16.2 % — ABNORMAL HIGH (ref 11.5–15.5)
WBC: 5.6 10*3/uL (ref 4.0–10.5)
nRBC: 0 % (ref 0.0–0.2)

## 2020-10-24 LAB — BASIC METABOLIC PANEL
Anion gap: 8 (ref 5–15)
BUN: 14 mg/dL (ref 8–23)
CO2: 32 mmol/L (ref 22–32)
Calcium: 9.1 mg/dL (ref 8.9–10.3)
Chloride: 97 mmol/L — ABNORMAL LOW (ref 98–111)
Creatinine, Ser: 0.7 mg/dL (ref 0.44–1.00)
GFR, Estimated: >60 mL/min (ref >60)
Glucose, Bld: 106 mg/dL — ABNORMAL HIGH (ref 70–99)
Potassium: 4.6 mmol/L (ref 3.5–5.1)
Sodium: 137 mmol/L (ref 135–145)

## 2020-10-24 LAB — CBG MONITORING, ED: Glucose-Capillary: 106 mg/dL — ABNORMAL HIGH (ref 70–99)

## 2020-10-24 NOTE — ED Notes (Addendum)
Removed IV, helped pt change, got pt wheelchair and assisted pt into son's car, advised pt to keep head dry for 24hrs d/t staples, and then can shower tomorrow- do not scrub or pick at staples and have staples removed in 1 week. Taped pt's left big toe and 2nd toe. Pt has podiatrist appt coming up. Pt did not have questions, ready for discharge.

## 2020-10-24 NOTE — ED Notes (Signed)
[facility] called to arrange transport back to [facility]

## 2020-10-24 NOTE — ED Notes (Signed)
Purwick placed on patient. Patient aware we need UA.

## 2020-10-24 NOTE — ED Triage Notes (Addendum)
Patient arrived via [facility] from  [facility] out for a fall today.   Patient reports she felt woozy, fell on her but, and hit her head.   No LOC, no blood thinners (stopped eliquis 6 months ago)  3in lac to left side of head. Bleeding is controlled.  Skin rub to left elbow and back per patient.    C/o dizziness and headache    A/Ox4  22g Left Hand   BP- 186/77 HR-45-70 afibb  95% RA  Patient did not have any medicine today from facility

## 2020-10-24 NOTE — ED Provider Notes (Signed)
[name] DE[facility] Provider Note  CSN: [id number] Arrival date & time: 10/24/20 1149    History Chief Complaint  Patient presents with   Fall   Laceration    head     Fall  Laceration  [name] is a 87 y.o. female brought to the ED via EMS from [facility] where she had an unwitnessed fall today. She states she lost her balance, fell backwards onto her bottom and then hit her head. She did not have LOC. She is complaining of headache, L hip pain and L foot pain. She states she also fell a few days ago. She is unsteady on her feet at baseline and uses a walker to get around. She is no longer taking blood thinners. She has a history of afib.    Past Medical History:  Diagnosis Date   Arthritis    Atrial fibrillation ([place])    Cancer ([place])    breast   Cataract    Diabetes mellitus without complication ([place])    Frequent UTI    Hyperlipidemia    Hypertension    Neck pain    Scoliosis    Stroke [facility])     Past Surgical History:  Procedure Laterality Date   APPENDECTOMY     BREAST LUMPECTOMY Left    CHOLECYSTECTOMY     JOINT REPLACEMENT     bilat knee    REPLACEMENT TOTAL KNEE BILATERAL Bilateral    TONSILLECTOMY      Family History  Problem Relation Age of Onset   Cancer Mother        lung   Arthritis Mother    Heart disease Father        endocarditis   Cancer Brother        lung, throat   Alcohol abuse Brother     Social History   Tobacco Use   Smoking status: Never   Smokeless tobacco: Never  Vaping Use   Vaping Use: Never used  Substance Use Topics   Alcohol use: No    Alcohol/week: 0.0 standard drinks   Drug use: No     Home Medications Prior to Admission medications   Medication Sig Start Date End Date Taking? Authorizing Provider  acetaminophen (TYLENOL) 500 MG tablet Take 1 tablet (500 mg total) by mouth every 6 (six) hours as needed for moderate pain. 07/16/19   Arrien, Jimmy Picket, MD  atorvastatin (LIPITOR) 80 MG  tablet Take 1 tablet (80 mg total) by mouth at bedtime. 02/21/20   Ivy Lynn, NP  blood glucose meter kit and supplies Dispense based on patient and insurance preference. Use up to four times daily as directed. (FOR ICD-10 E10.9, E11.9).accucheck 02/20/20   Ivy Lynn, NP  cephALEXin (KEFLEX) 500 MG capsule Take 1 capsule (500 mg total) by mouth 2 (two) times daily. Patient not taking: Reported on 04/09/2020 02/25/20   Ivy Lynn, NP  Cholecalciferol (VITAMIN D3) 50 MCG (2000 UT) TABS Take 2,000 Units by mouth in the morning.    [provider]  cloNIDine (CATAPRES) 0.1 MG tablet Take 1 tablet (0.1 mg total) by mouth 2 (two) times daily as needed (for Systolic over 263). 02/21/20   Ivy Lynn, NP  DULoxetine (CYMBALTA) 20 MG capsule Take 1 capsule (20 mg total) by mouth daily. 02/20/20 04/09/20  Ivy Lynn, NP  ferrous sulfate 325 (65 FE) MG tablet Take 1 tablet (325 mg total) by mouth daily with breakfast. Patient not taking: Reported  on 04/09/2020 02/20/20   Ivy Lynn, NP  glucose blood (ONE TOUCH ULTRA TEST) test strip USE TO check blood sugars twice daily 02/28/20   Gwenlyn Perking, FNP  hydrALAZINE (APRESOLINE) 50 MG tablet Take 1 tablet (50 mg total) by mouth 3 (three) times daily. 04/03/20   Ivy Lynn, NP  Insulin Pen Needle (PEN NEEDLES) 32G X 4 MM MISC Patient test bid 02/20/20   Ivy Lynn, NP  Insulin Pen Needle 31G X 8 MM MISC Use once daily with lantus solostar 10/30/17   Claretta Fraise, MD  Lancets (FREESTYLE) lancets Use as instructed 02/20/20   Ivy Lynn, NP  LANTUS SOLOSTAR 100 UNIT/ML Solostar Pen INJECT 50 UNITS ONCE DAILY AT 10PM Patient taking differently: Inject 10 Units into the skin at bedtime.  06/18/19   Baruch Gouty, FNP  Magnesium 250 MG TABS Take 1 tablet (250 mg total) by mouth daily. 02/20/20   Ivy Lynn, NP  meloxicam (MOBIC) 7.5 MG tablet Take 1 tablet (7.5 mg total) by mouth daily. Patient not  taking: Reported on 04/09/2020 02/28/20   Gwenlyn Perking, FNP  metFORMIN (GLUCOPHAGE) 500 MG tablet Take 1 tablet (500 mg total) by mouth daily with breakfast. 02/20/20   Ivy Lynn, NP  NOVOLOG FLEXPEN 100 UNIT/ML FlexPen Inject 0-10 Units into the skin See admin instructions. Sliding scales 1 to 150=0 units, 151-200=2 units, 201-250=4 units, 251-300=6 units, 301-350=8 units, 351-400=10 units >400 call Dr < 70 call Dr 09/14/19   [provider]  omeprazole (PRILOSEC) 20 MG capsule TAKE  (1)  CAPSULE  TWICE DAILY (TAKE ON AN EMPTY STOMACH AT LEAST 30 MIN- UTES BEFORE MEALS). 03/15/20   Ivy Lynn, NP  ondansetron (ZOFRAN) 4 MG tablet Take 1 tablet (4 mg total) by mouth every 8 (eight) hours as needed for nausea or vomiting. 02/20/20   Ivy Lynn, NP  oxymetazoline (AFRIN) 0.05 % nasal spray Place 1 spray into both nostrils 2 (two) times daily. 02/20/20   Ivy Lynn, NP  potassium chloride SA (KLOR-CON) 20 MEQ tablet Take 20 mEq by mouth daily.    [provider]  rOPINIRole (REQUIP) 0.25 MG tablet Take 1 tablet (0.25 mg total) by mouth 3 (three) times daily. Take 0.25 mg by mouth two times a day and 0.25 mg at bedtime 04/03/20 05/03/20  Ivy Lynn, NP  SUPER B COMPLEX/C PO Take 1 tablet by mouth daily.    [provider]  tiZANidine (ZANAFLEX) 4 MG capsule Take 1 capsule (4 mg total) by mouth 3 (three) times daily as needed for muscle spasms. Patient not taking: Reported on 04/09/2020 02/28/20   Gwenlyn Perking, FNP  traMADol (ULTRAM) 50 MG tablet Take 50 mg by mouth 2 (two) times daily as needed. 12/08/19   [provider]  traZODone (DESYREL) 150 MG tablet Take 0.5 tablets (75 mg total) by mouth at bedtime. 02/20/20   Ivy Lynn, NP     Allergies    Diltiazem hcl, Duloxetine hcl, and Ace inhibitors   Review of Systems   Review of Systems A comprehensive review of systems was completed and negative except as noted in HPI.     Physical Exam BP (!) 181/80 (BP Location: Right Arm)   Pulse (!) 58   Temp 97.7 F (36.5 C) (Oral)   Resp 17   SpO2 94%   Physical Exam Vitals and nursing note reviewed.  Constitutional:      Appearance:  Normal appearance.  HENT:     Head: Normocephalic.     Comments: 3cm laceration to L parietal scalp    Nose: Nose normal.     Mouth/Throat:     Mouth: Mucous membranes are moist.  Eyes:     Extraocular Movements: Extraocular movements intact.     Conjunctiva/sclera: Conjunctivae normal.  Cardiovascular:     Rate and Rhythm: Bradycardia present. Rhythm irregular.  Pulmonary:     Effort: Pulmonary effort is normal.     Breath sounds: Normal breath sounds.  Abdominal:     General: Abdomen is flat.     Palpations: Abdomen is soft.     Tenderness: There is no abdominal tenderness.     Comments: Small reducible umbilical hernia, not tender  Musculoskeletal:        General: No swelling. Normal range of motion.     Cervical back: Neck supple. No tenderness.     Comments: L leg is shortened and externally rotated, tender to palpation lateral hip but able to ROM. Also erythema and swelling to L great toe, tender to palpation. Distal pulses intact  Skin:    General: Skin is warm and dry.  Neurological:     General: No focal deficit present.     Mental Status: She is alert.     Cranial Nerves: No cranial nerve deficit.     Sensory: No sensory deficit.     Motor: No weakness.  Psychiatric:        Mood and Affect: Mood normal.     ED Results / Procedures / Treatments   Labs (all labs ordered are listed, but only abnormal results are displayed) Labs Reviewed  BASIC METABOLIC PANEL - Abnormal; Notable for the following components:      Result Value   Chloride 97 (*)    Glucose, Bld 106 (*)    All other components within normal limits  CBC - Abnormal; Notable for the following components:   RDW 16.2 (*)    All other components within normal limits  URINALYSIS, ROUTINE  W REFLEX MICROSCOPIC - Abnormal; Notable for the following components:   APPearance HAZY (*)    Leukocytes,Ua SMALL (*)    All other components within normal limits  CBG MONITORING, ED - Abnormal; Notable for the following components:   Glucose-Capillary 106 (*)    All other components within normal limits    EKG EKG Interpretation  Date/Time:  Wednesday October 24 2020 12:19:23 EDT Ventricular Rate:  50 PR Interval:  150 QRS Duration: 91 QT Interval:  493 QTC Calculation: 450 R Axis:   33 Text Interpretation: Atrial fibrillation with slow ventricular response Abnormal ekg Since last tracing Rate slower Confirmed by Calvert Cantor [phone]) on 10/24/2020 1:30:40 PM  Radiology CT Head Wo Contrast  Result Date: 10/24/2020 CLINICAL DATA:  Neck trauma (Age >= 65y); Head trauma, minor (Age >= 65y) EXAM: CT HEAD WITHOUT CONTRAST CT CERVICAL SPINE WITHOUT CONTRAST TECHNIQUE: Multidetector CT imaging of the head and cervical spine was performed following the standard protocol without intravenous contrast. Multiplanar CT image reconstructions of the cervical spine were also generated. COMPARISON:  Head and cervical spine CT 07/13/2019 FINDINGS: CT HEAD FINDINGS Brain: Previous left frontal and subdural hematomas have resolved from prior exam. No evidence of acute infarction, hemorrhage, hydrocephalus, extra-axial collection or mass lesion/mass effect. Stable degree of atrophy and advanced chronic small vessel ischemia. Vascular: Atherosclerosis of skullbase vasculature without hyperdense vessel or abnormal calcification. Skull: No fracture or focal lesion. Sinuses/Orbits:  Mucosal thickening of ethmoid air cells bilateral cataract resection. No acute fracture. Other: Left frontal parietal scalp hematoma with skin staples in place. CT CERVICAL SPINE FINDINGS Alignment: Trace anterolisthesis of C7 on T1. No traumatic subluxation. Skull base and vertebrae: No acute fracture. Vertebral body heights are  maintained. The dens and skull base are intact. Soft tissues and spinal canal: No prevertebral fluid or swelling. No visible canal hematoma. Disc levels: Degenerative disc disease C4-C5 through C6-C7. There is moderate multilevel facet hypertrophy Upper chest: No acute or unexpected findings. Other: None. IMPRESSION: 1. Left frontal parietal scalp hematoma with skin staples in place. No acute intracranial abnormality. No skull fracture. 2. Stable atrophy and advanced chronic small vessel ischemia. 3. Multilevel degenerative change throughout the cervical spine without acute fracture or subluxation. Electronically Signed   By: Keith Rake M.D.   On: 10/24/2020 16:12   CT Cervical Spine Wo Contrast  Result Date: 10/24/2020 CLINICAL DATA:  Neck trauma (Age >= 65y); Head trauma, minor (Age >= 65y) EXAM: CT HEAD WITHOUT CONTRAST CT CERVICAL SPINE WITHOUT CONTRAST TECHNIQUE: Multidetector CT imaging of the head and cervical spine was performed following the standard protocol without intravenous contrast. Multiplanar CT image reconstructions of the cervical spine were also generated. COMPARISON:  Head and cervical spine CT 07/13/2019 FINDINGS: CT HEAD FINDINGS Brain: Previous left frontal and subdural hematomas have resolved from prior exam. No evidence of acute infarction, hemorrhage, hydrocephalus, extra-axial collection or mass lesion/mass effect. Stable degree of atrophy and advanced chronic small vessel ischemia. Vascular: Atherosclerosis of skullbase vasculature without hyperdense vessel or abnormal calcification. Skull: No fracture or focal lesion. Sinuses/Orbits: Mucosal thickening of ethmoid air cells bilateral cataract resection. No acute fracture. Other: Left frontal parietal scalp hematoma with skin staples in place. CT CERVICAL SPINE FINDINGS Alignment: Trace anterolisthesis of C7 on T1. No traumatic subluxation. Skull base and vertebrae: No acute fracture. Vertebral body heights are maintained. The  dens and skull base are intact. Soft tissues and spinal canal: No prevertebral fluid or swelling. No visible canal hematoma. Disc levels: Degenerative disc disease C4-C5 through C6-C7. There is moderate multilevel facet hypertrophy Upper chest: No acute or unexpected findings. Other: None. IMPRESSION: 1. Left frontal parietal scalp hematoma with skin staples in place. No acute intracranial abnormality. No skull fracture. 2. Stable atrophy and advanced chronic small vessel ischemia. 3. Multilevel degenerative change throughout the cervical spine without acute fracture or subluxation. Electronically Signed   By: Keith Rake M.D.   On: 10/24/2020 16:12   DG Foot Complete Left  Result Date: 10/24/2020 CLINICAL DATA:  Fall left great toe pain. EXAM: LEFT FOOT - COMPLETE 3+ VIEW COMPARISON:  None. FINDINGS: Nondisplaced fracture of the first proximal phalangeal shaft. Hallux valgus with first metatarsophalangeal joint osteoarthritis. Osteopenia. IMPRESSION: 1. Nondisplaced fracture of the first proximal phalangeal shaft. 2. Hallux valgus with first metatarsophalangeal joint osteoarthritis. 3. Osteopenia. Electronically Signed   By: Lorin Picket M.D.   On: 10/24/2020 14:32   DG Hip Unilat With Pelvis 2-3 Views Left  Result Date: 10/24/2020 CLINICAL DATA:  Fall.  Left hip pain EXAM: DG HIP (WITH OR WITHOUT PELVIS) 2-3V LEFT COMPARISON:  04/09/2020 . FINDINGS: The left hip is in internal rotation however no fracture identified. Normal joint space bilaterally in the hip. No pelvic fracture IMPRESSION: Left hip is in persistent internal rotation however no fracture identified. Electronically Signed   By: Franchot Gallo M.D.   On: 10/24/2020 14:28    Procedures .Marland KitchenLaceration Repair  Date/Time: 10/24/2020 2:42  PM Performed by: Truddie Hidden, MD Authorized by: Truddie Hidden, MD   Consent:    Consent obtained:  Verbal   Consent given by:  Patient Anesthesia:    Anesthesia method:  Local  infiltration   Local anesthetic:  Lidocaine 1% w/o epi Laceration details:    Location:  Scalp   Scalp location:  L parietal   Length (cm):  4 Pre-procedure details:    Preparation:  Patient was prepped and draped in usual sterile fashion Treatment:    Area cleansed with:  Saline   Amount of cleaning:  Standard   Irrigation solution:  Sterile saline   Irrigation method:  Syringe   Debridement:  None Skin repair:    Repair method:  Staples   Number of staples:  5 Approximation:    Approximation:  Close Repair type:    Repair type:  Simple Post-procedure details:    Dressing:  Antibiotic ointment   Procedure completion:  Tolerated well, no immediate complications  Medications Ordered in the ED Medications - No data to display   MDM Rules/Calculators/A&P MDM Patient with fall, unclear if this was due to her chronic unsteady gait or if was dizzy from her bradycardia. She has history of afib, no recent vitals with low HR though. She is not on any rate lowering medications per the chart. Will check imaging of head, LLE. Check labs and reassess. She has a prior DNR and MOST form on file.   ED Course  I have reviewed the triage vital signs and the nursing notes.  Pertinent labs & imaging results that were available during my care of the patient were reviewed by me and considered in my medical decision making (see chart for details).  Clinical Course as of 10/24/20 1712  Wed Oct 24, 2020  1344 CBC is unremarkable.  [CS]  8333 UA equivocal for infection. Not reporting any urinary symptoms today. BMP is unremarkable.  [CS]  8329 Xrays reviewed, no signs of hip fracture. She continues to have no pain with ROM. Toe fx identified, will need buddy tape.  [CS]  1916 CT head neg for acute intracranial injury. C-spine is neg.  [CS]  1708 Discussed results with patient and son at bedside. Will plan discharge back to SNF. Definitive fracture care provided in the ED. Continue to buddy tape as  needed for comfort. Staple removal in 7-10 days. HR has been improving in the ED. She is not interested in evaluation for pacemaker, etc. PCP follow up.  [CS]    Clinical Course User Index [CS] Truddie Hidden, MD    Final Clinical Impression(s) / ED Diagnoses Final diagnoses:  Injury of head, initial encounter  Laceration of scalp, initial encounter  Atrial fibrillation with slow ventricular response ([place])  Other fracture of left great toe, initial encounter for closed fracture    Rx / DC Orders ED Discharge Orders     None        Truddie Hidden, MD 10/24/20 1712

## 2020-10-24 NOTE — ED Notes (Signed)
Son states he will take pt back to SNF, called [facility] to cancel

## 2020-10-24 NOTE — ED Notes (Signed)
Brought pt and son sandwiches and drink

## 2020-10-29 DIAGNOSIS — R040 Epistaxis: Secondary | ICD-10-CM | POA: Diagnosis not present

## 2020-10-29 DIAGNOSIS — N952 Postmenopausal atrophic vaginitis: Secondary | ICD-10-CM | POA: Diagnosis not present

## 2020-10-29 DIAGNOSIS — I4891 Unspecified atrial fibrillation: Secondary | ICD-10-CM | POA: Diagnosis not present

## 2020-10-29 DIAGNOSIS — G2581 Restless legs syndrome: Secondary | ICD-10-CM | POA: Diagnosis not present

## 2020-10-29 DIAGNOSIS — K5900 Constipation, unspecified: Secondary | ICD-10-CM | POA: Diagnosis not present

## 2020-10-29 DIAGNOSIS — I10 Essential (primary) hypertension: Secondary | ICD-10-CM | POA: Diagnosis not present

## 2020-10-29 DIAGNOSIS — G4700 Insomnia, unspecified: Secondary | ICD-10-CM | POA: Diagnosis not present

## 2020-10-29 DIAGNOSIS — R296 Repeated falls: Secondary | ICD-10-CM | POA: Diagnosis not present

## 2020-10-30 ENCOUNTER — Ambulatory Visit (INDEPENDENT_AMBULATORY_CARE_PROVIDER_SITE_OTHER): Payer: PPO | Admitting: Licensed Clinical Social Worker

## 2020-10-30 DIAGNOSIS — E785 Hyperlipidemia, unspecified: Secondary | ICD-10-CM

## 2020-10-30 DIAGNOSIS — I152 Hypertension secondary to endocrine disorders: Secondary | ICD-10-CM | POA: Diagnosis not present

## 2020-10-30 DIAGNOSIS — E1169 Type 2 diabetes mellitus with other specified complication: Secondary | ICD-10-CM

## 2020-10-30 DIAGNOSIS — E1159 Type 2 diabetes mellitus with other circulatory complications: Secondary | ICD-10-CM

## 2020-10-30 DIAGNOSIS — I5032 Chronic diastolic (congestive) heart failure: Secondary | ICD-10-CM

## 2020-10-30 DIAGNOSIS — I63312 Cerebral infarction due to thrombosis of left middle cerebral artery: Secondary | ICD-10-CM

## 2020-10-30 DIAGNOSIS — M159 Polyosteoarthritis, unspecified: Secondary | ICD-10-CM

## 2020-10-30 DIAGNOSIS — Z794 Long term (current) use of insulin: Secondary | ICD-10-CM

## 2020-10-30 DIAGNOSIS — J8410 Pulmonary fibrosis, unspecified: Secondary | ICD-10-CM

## 2020-10-30 DIAGNOSIS — M8949 Other hypertrophic osteoarthropathy, multiple sites: Secondary | ICD-10-CM

## 2020-10-30 DIAGNOSIS — E1140 Type 2 diabetes mellitus with diabetic neuropathy, unspecified: Secondary | ICD-10-CM | POA: Diagnosis not present

## 2020-10-30 DIAGNOSIS — I4891 Unspecified atrial fibrillation: Secondary | ICD-10-CM

## 2020-10-30 NOTE — Chronic Care Management (AMB) (Signed)
Chronic Care Management    Clinical Social Work Note  10/30/2020 Name: [name] MRN: [id number] DOB: 31-Jan-1926  [name] is a 87 y.o. year old female who is a primary care patient of Ivy Lynn, NP. The CCM team was consulted to assist the patient with chronic disease management and/or care coordination needs related to: [employer] .   Engaged with patient by telephone for follow up visit in response to provider referral for social work chronic care management and care coordination services.   Consent to Services:  The patient was given information about Chronic Care Management services, agreed to services, and gave verbal consent prior to initiation of services.  Please see initial visit note for detailed documentation.   Patient agreed to services and consent obtained.   Assessment: Review of patient past medical history, allergies, medications, and health status, including review of relevant consultants reports was performed today as part of a comprehensive evaluation and provision of chronic care management and care coordination services.     SDOH (Social Determinants of Health) assessments and interventions performed:  SDOH Interventions    Flowsheet Row Most Recent Value  SDOH Interventions   Depression Interventions/Treatment  Medication        Advanced Directives Status: See Vynca application for related entries.  CCM Care Plan  Allergies  Allergen Reactions   Diltiazem Hcl Itching, Other (See Comments) and Rash    Red itchy rash started a few hours after taking short acting $RemoveBefo'120mg'eHBIXckPSSe$  dilt tabs Red itchy rash started a few hours after taking short acting $RemoveBefo'120mg'eAMfJnnvzfL$  dilt tabs Red itchy rash started a few hours after taking short acting $RemoveBefo'120mg'thLYAMQzwRf$  dilt tabs   Duloxetine Hcl Anxiety, Other (See Comments) and Rash    Makes patient feel "restless and scared" and unable to sleep Makes patient feel "restless and scared" and unable to sleep Makes patient feel  "restless and scared" and unable to sleep   Ace Inhibitors Cough    Outpatient Encounter Medications as of 10/30/2020  Medication Sig   acetaminophen (TYLENOL) 500 MG tablet Take 1 tablet (500 mg total) by mouth every 6 (six) hours as needed for moderate pain.   atorvastatin (LIPITOR) 80 MG tablet Take 1 tablet (80 mg total) by mouth at bedtime.   blood glucose meter kit and supplies Dispense based on patient and insurance preference. Use up to four times daily as directed. (FOR ICD-10 E10.9, E11.9).accucheck   cephALEXin (KEFLEX) 500 MG capsule Take 1 capsule (500 mg total) by mouth 2 (two) times daily. (Patient not taking: Reported on 04/09/2020)   Cholecalciferol (VITAMIN D3) 50 MCG (2000 UT) TABS Take 2,000 Units by mouth in the morning.   cloNIDine (CATAPRES) 0.1 MG tablet Take 1 tablet (0.1 mg total) by mouth 2 (two) times daily as needed (for Systolic over 163).   DULoxetine (CYMBALTA) 20 MG capsule Take 1 capsule (20 mg total) by mouth daily.   ferrous sulfate 325 (65 FE) MG tablet Take 1 tablet (325 mg total) by mouth daily with breakfast. (Patient not taking: Reported on 04/09/2020)   glucose blood (ONE TOUCH ULTRA TEST) test strip USE TO check blood sugars twice daily   hydrALAZINE (APRESOLINE) 50 MG tablet Take 1 tablet (50 mg total) by mouth 3 (three) times daily.   Insulin Pen Needle (PEN NEEDLES) 32G X 4 MM MISC Patient test bid   Insulin Pen Needle 31G X 8 MM MISC Use once daily with lantus solostar   Lancets (FREESTYLE) lancets Use  as instructed   LANTUS SOLOSTAR 100 UNIT/ML Solostar Pen INJECT 50 UNITS ONCE DAILY AT 10PM (Patient taking differently: Inject 10 Units into the skin at bedtime. )   Magnesium 250 MG TABS Take 1 tablet (250 mg total) by mouth daily.   meloxicam (MOBIC) 7.5 MG tablet Take 1 tablet (7.5 mg total) by mouth daily. (Patient not taking: Reported on 04/09/2020)   metFORMIN (GLUCOPHAGE) 500 MG tablet Take 1 tablet (500 mg total) by mouth daily with breakfast.    NOVOLOG FLEXPEN 100 UNIT/ML FlexPen Inject 0-10 Units into the skin See admin instructions. Sliding scales 1 to 150=0 units, 151-200=2 units, 201-250=4 units, 251-300=6 units, 301-350=8 units, 351-400=10 units >400 call Dr < 70 call Dr   omeprazole (PRILOSEC) 20 MG capsule TAKE  (1)  CAPSULE  TWICE DAILY (TAKE ON AN EMPTY STOMACH AT LEAST 30 MIN- UTES BEFORE MEALS).   ondansetron (ZOFRAN) 4 MG tablet Take 1 tablet (4 mg total) by mouth every 8 (eight) hours as needed for nausea or vomiting.   oxymetazoline (AFRIN) 0.05 % nasal spray Place 1 spray into both nostrils 2 (two) times daily.   potassium chloride SA (KLOR-CON) 20 MEQ tablet Take 20 mEq by mouth daily.   rOPINIRole (REQUIP) 0.25 MG tablet Take 1 tablet (0.25 mg total) by mouth 3 (three) times daily. Take 0.25 mg by mouth two times a day and 0.25 mg at bedtime   SUPER B COMPLEX/C PO Take 1 tablet by mouth daily.   tiZANidine (ZANAFLEX) 4 MG capsule Take 1 capsule (4 mg total) by mouth 3 (three) times daily as needed for muscle spasms. (Patient not taking: Reported on 04/09/2020)   traMADol (ULTRAM) 50 MG tablet Take 50 mg by mouth 2 (two) times daily as needed.   traZODone (DESYREL) 150 MG tablet Take 0.5 tablets (75 mg total) by mouth at bedtime.   No facility-administered encounter medications on file as of 10/30/2020.    Patient Active Problem List   Diagnosis Date Noted   Encounter to establish care 02/20/2020   Bilateral subdural hematomas ([place]) 07/16/2019   CHI (closed head injury), initial encounter 07/14/2019   Left radial head fracture 07/14/2019   SDH (subdural hematoma) ([place])    Hypertensive urgency    Subarachnoid hemorrhage following injury ([place]) 07/13/2019   Acute encephalopathy 06/27/2019   Heat stroke 06/27/2019   Hypokalemia 06/27/2019   Fever, unspecified 06/27/2019   Stroke ([place]) 06/27/2019   Altered mental status 06/26/2019   Right hip pain 06/22/2019   Chronic left hip pain 05/25/2019   Depression,  recurrent ([place]) 05/25/2019   Chronic anticoagulation 04/19/2019   Leg cramps 03/04/2019   Short-term memory loss 03/04/2019   DOE (dyspnea on exertion) 03/04/2019   Aortic atherosclerosis ([place]) 03/04/2019   Physical deconditioning 01/24/2019   History of recurrent UTIs 12/03/2018   Chronic pain of right knee 10/08/2018   Type 2 diabetes mellitus with diabetic neuropathy, unspecified ([place]) 08/12/2018   Pulmonary vascular congestion    Chronic diastolic CHF (congestive heart failure) ([place])    Acute hypoxemic respiratory failure ([place]) 12/06/2017   Permanent atrial fibrillation ([place]) 12/06/2017   Trochanteric bursitis, right hip 07/16/2017   Other intervertebral disc degeneration, lumbar region 07/16/2017   Osteoporosis 11/04/2016   Diabetic polyneuropathy associated with type 2 diabetes mellitus ([place]) 11/14/2014   Fibromyalgia 02/11/2013   CVA (cerebral vascular accident) ([place]) 08/01/2008   Osteoarthritis 04/09/2007   Hyperlipidemia associated with type 2 diabetes mellitus ([place]) 03/08/2007   Hypertension associated with diabetes ([place])  03/08/2007   Pulmonary fibrosis ([place]) 03/08/2007   GERD 03/08/2007   BREAST CANCER, HX OF 03/08/2007    Conditions to be addressed/monitored: Monitor client management of depression issues ; monitor client completion of ADLs, with assistance as needed   Care Plan : General Social Work (Adult)  Updates made by Katha Cabal, [name] since 10/30/2020 12:00 AM     Problem: Coping Skills (General Plan of Care)      Goal: Wants to complete ADLs daily and wants to manage emotions/anxiety over ongoing health concerns   Start Date: 10/30/2020  Expected End Date: 01/28/2021  This Visit's Progress: On track  Recent Progress: On track  Priority: Medium  Note:   Current Barriers:  Chronic Mental Health needs related to anxiety and stress management Mobility challenges ADLs completion challenges Suicidal Ideation/Homicidal Ideation: No  Clinical Social  Work Goal(s):  patient will work with SW monthly by telephone or in person to reduce or manage symptoms related to anxiety and stress issues faced Patient will communicate with SW monthly by telephone or in person to discuss mobility issues of client and pain issues of client   Interventions: 1:1 collaboration with Ivy Lynn, NP regarding development and update of comprehensive plan of cafe as evidenced by provider attestation and co-signature Talked with Fraser Din about her mobility (she uses a Emergency planning/management officer to help her walk) Talk with Fraser Din about pain issues (she had recent fall ) Talked with Fraser Din about ADLs completion (she said she needs help with bathing and dressing) Talked with client about support from her son, [name]  Talked with client about appetite of client (decreased appetite) Talked with client about sleeping issues of client Talked with client about care level needs of client (she said she is at [place] level of care at present) Talked with client about transport needs of client (facility has a Printmaker to take client to her appointments as scheduled ) Talked with client about vision of client Talked with client about fall history of client Talked with client about support from caregivers at [facility] Talked with client about friends she has  at facility and socialization with friends at facility Talked with client about medical care she receives at facility  Patient Self Care Activities:  Self administers medications as prescribed Attends all scheduled provider appointments Calls provider office for new concerns or questions  Patient Coping Strengths:  Supportive Relationships Family Friends  Patient Self Care Deficits:  Unable to perform ADLs independently Mobility challenges  Patient Goals:  - spend time or talk with others every day - practice relaxation or meditation daily - keep a calendar with appointment dates  Follow Up Plan: [name] to call client  or her son, [name] on 12/13/20 to assess client needs      Norva Riffle.Estefano Victory MSW, [name] Licensed Clinical Social Worker [facility] Care Management [phone]

## 2020-10-30 NOTE — Patient Instructions (Signed)
Visit Information  PATIENT GOALS:  Goals Addressed             This Visit's Progress    Protect My Health; Manage anxiety and stress issues       Timeframe:  Short-Term Goal Priority:  Medium Progress: On Track Start Date:             10/30/20                Expected End Date:           01/28/21            Follow Up Date  12/13/20   Protect My Health: Manage anxiety and stress issues    Why is this important?   Screening tests can find diseases early when they are easier to treat.  Your doctor or nurse will talk with you about which tests are important for you.  Getting shots for common diseases like the flu and shingles will help prevent them.     Patient Self Care Activities:  Self administers medications as prescribed Attends all scheduled provider appointments Performs ADL's independently Calls provider office for new concerns or questions  Patient Coping Strengths:  Supportive Relationships Family Friends  Patient Self Care Deficits:  Unable to perform IADLs independently Mobility challenges  Patient Goals:  - spend time or talk with others every day - practice relaxation or meditation daily - keep a calendar with appointment dates  Follow Up Plan: [name] to call client on 12/13/20 to assess client needs      Norva Riffle.Shatera Rennert MSW, [name] Licensed Clinical Social Worker [facility] Care Management [phone]

## 2020-11-01 DIAGNOSIS — E559 Vitamin D deficiency, unspecified: Secondary | ICD-10-CM | POA: Diagnosis not present

## 2020-11-01 DIAGNOSIS — E1142 Type 2 diabetes mellitus with diabetic polyneuropathy: Secondary | ICD-10-CM | POA: Diagnosis not present

## 2020-11-01 DIAGNOSIS — D508 Other iron deficiency anemias: Secondary | ICD-10-CM | POA: Diagnosis not present

## 2020-11-01 DIAGNOSIS — I639 Cerebral infarction, unspecified: Secondary | ICD-10-CM | POA: Diagnosis not present

## 2020-11-01 DIAGNOSIS — I10 Essential (primary) hypertension: Secondary | ICD-10-CM | POA: Diagnosis not present

## 2020-11-01 DIAGNOSIS — I4891 Unspecified atrial fibrillation: Secondary | ICD-10-CM | POA: Diagnosis not present

## 2020-11-01 DIAGNOSIS — E119 Type 2 diabetes mellitus without complications: Secondary | ICD-10-CM | POA: Diagnosis not present

## 2020-11-01 DIAGNOSIS — F339 Major depressive disorder, recurrent, unspecified: Secondary | ICD-10-CM | POA: Diagnosis not present

## 2020-11-01 DIAGNOSIS — M1990 Unspecified osteoarthritis, unspecified site: Secondary | ICD-10-CM | POA: Diagnosis not present

## 2020-11-01 DIAGNOSIS — I5032 Chronic diastolic (congestive) heart failure: Secondary | ICD-10-CM | POA: Diagnosis not present

## 2020-11-01 DIAGNOSIS — F329 Major depressive disorder, single episode, unspecified: Secondary | ICD-10-CM | POA: Diagnosis not present

## 2020-11-06 DIAGNOSIS — N390 Urinary tract infection, site not specified: Secondary | ICD-10-CM | POA: Diagnosis not present

## 2020-11-07 DIAGNOSIS — G4700 Insomnia, unspecified: Secondary | ICD-10-CM | POA: Diagnosis not present

## 2020-11-07 DIAGNOSIS — K5900 Constipation, unspecified: Secondary | ICD-10-CM | POA: Diagnosis not present

## 2020-11-07 DIAGNOSIS — R040 Epistaxis: Secondary | ICD-10-CM | POA: Diagnosis not present

## 2020-11-07 DIAGNOSIS — R4182 Altered mental status, unspecified: Secondary | ICD-10-CM | POA: Diagnosis not present

## 2020-11-07 DIAGNOSIS — F32A Depression, unspecified: Secondary | ICD-10-CM | POA: Diagnosis not present

## 2020-11-07 DIAGNOSIS — I739 Peripheral vascular disease, unspecified: Secondary | ICD-10-CM | POA: Diagnosis not present

## 2020-11-07 DIAGNOSIS — R296 Repeated falls: Secondary | ICD-10-CM | POA: Diagnosis not present

## 2020-11-07 DIAGNOSIS — I4891 Unspecified atrial fibrillation: Secondary | ICD-10-CM | POA: Diagnosis not present

## 2020-11-07 DIAGNOSIS — N390 Urinary tract infection, site not specified: Secondary | ICD-10-CM | POA: Diagnosis not present

## 2020-11-07 DIAGNOSIS — M7989 Other specified soft tissue disorders: Secondary | ICD-10-CM | POA: Diagnosis not present

## 2020-11-08 DIAGNOSIS — K5900 Constipation, unspecified: Secondary | ICD-10-CM | POA: Diagnosis not present

## 2020-11-08 DIAGNOSIS — R4182 Altered mental status, unspecified: Secondary | ICD-10-CM | POA: Diagnosis not present

## 2020-11-08 DIAGNOSIS — R5381 Other malaise: Secondary | ICD-10-CM | POA: Diagnosis not present

## 2020-11-08 DIAGNOSIS — R601 Generalized edema: Secondary | ICD-10-CM | POA: Diagnosis not present

## 2020-11-08 DIAGNOSIS — N390 Urinary tract infection, site not specified: Secondary | ICD-10-CM | POA: Diagnosis not present

## 2020-11-08 DIAGNOSIS — E1142 Type 2 diabetes mellitus with diabetic polyneuropathy: Secondary | ICD-10-CM | POA: Diagnosis not present

## 2020-11-08 DIAGNOSIS — G8929 Other chronic pain: Secondary | ICD-10-CM | POA: Diagnosis not present

## 2020-11-08 DIAGNOSIS — E559 Vitamin D deficiency, unspecified: Secondary | ICD-10-CM | POA: Diagnosis not present

## 2020-11-08 DIAGNOSIS — K219 Gastro-esophageal reflux disease without esophagitis: Secondary | ICD-10-CM | POA: Diagnosis not present

## 2020-11-08 DIAGNOSIS — I739 Peripheral vascular disease, unspecified: Secondary | ICD-10-CM | POA: Diagnosis not present

## 2020-11-12 DIAGNOSIS — I5032 Chronic diastolic (congestive) heart failure: Secondary | ICD-10-CM | POA: Diagnosis not present

## 2020-11-12 DIAGNOSIS — I4891 Unspecified atrial fibrillation: Secondary | ICD-10-CM | POA: Diagnosis not present

## 2020-11-12 DIAGNOSIS — E1142 Type 2 diabetes mellitus with diabetic polyneuropathy: Secondary | ICD-10-CM | POA: Diagnosis not present

## 2020-11-12 DIAGNOSIS — I10 Essential (primary) hypertension: Secondary | ICD-10-CM | POA: Diagnosis not present

## 2020-11-12 DIAGNOSIS — F339 Major depressive disorder, recurrent, unspecified: Secondary | ICD-10-CM | POA: Diagnosis not present

## 2020-11-12 DIAGNOSIS — E559 Vitamin D deficiency, unspecified: Secondary | ICD-10-CM | POA: Diagnosis not present

## 2020-11-12 DIAGNOSIS — I639 Cerebral infarction, unspecified: Secondary | ICD-10-CM | POA: Diagnosis not present

## 2020-11-12 DIAGNOSIS — D508 Other iron deficiency anemias: Secondary | ICD-10-CM | POA: Diagnosis not present

## 2020-11-12 DIAGNOSIS — E119 Type 2 diabetes mellitus without complications: Secondary | ICD-10-CM | POA: Diagnosis not present

## 2020-11-12 DIAGNOSIS — M1990 Unspecified osteoarthritis, unspecified site: Secondary | ICD-10-CM | POA: Diagnosis not present

## 2020-11-12 DIAGNOSIS — F329 Major depressive disorder, single episode, unspecified: Secondary | ICD-10-CM | POA: Diagnosis not present

## 2020-11-13 DIAGNOSIS — N390 Urinary tract infection, site not specified: Secondary | ICD-10-CM | POA: Diagnosis not present

## 2020-11-13 DIAGNOSIS — R2681 Unsteadiness on feet: Secondary | ICD-10-CM | POA: Diagnosis not present

## 2020-11-13 DIAGNOSIS — E1142 Type 2 diabetes mellitus with diabetic polyneuropathy: Secondary | ICD-10-CM | POA: Diagnosis not present

## 2020-11-13 DIAGNOSIS — M6281 Muscle weakness (generalized): Secondary | ICD-10-CM | POA: Diagnosis not present

## 2020-11-23 DIAGNOSIS — F329 Major depressive disorder, single episode, unspecified: Secondary | ICD-10-CM | POA: Diagnosis not present

## 2020-11-23 DIAGNOSIS — G4720 Circadian rhythm sleep disorder, unspecified type: Secondary | ICD-10-CM | POA: Diagnosis not present

## 2020-11-23 DIAGNOSIS — G8929 Other chronic pain: Secondary | ICD-10-CM | POA: Diagnosis not present

## 2020-11-26 DIAGNOSIS — I4891 Unspecified atrial fibrillation: Secondary | ICD-10-CM | POA: Diagnosis not present

## 2020-11-28 DIAGNOSIS — K219 Gastro-esophageal reflux disease without esophagitis: Secondary | ICD-10-CM | POA: Diagnosis not present

## 2020-11-28 DIAGNOSIS — R5381 Other malaise: Secondary | ICD-10-CM | POA: Diagnosis not present

## 2020-11-28 DIAGNOSIS — I10 Essential (primary) hypertension: Secondary | ICD-10-CM | POA: Diagnosis not present

## 2020-11-28 DIAGNOSIS — D508 Other iron deficiency anemias: Secondary | ICD-10-CM | POA: Diagnosis not present

## 2020-11-28 DIAGNOSIS — R1310 Dysphagia, unspecified: Secondary | ICD-10-CM | POA: Diagnosis not present

## 2020-11-28 DIAGNOSIS — E559 Vitamin D deficiency, unspecified: Secondary | ICD-10-CM | POA: Diagnosis not present

## 2020-11-28 DIAGNOSIS — M1990 Unspecified osteoarthritis, unspecified site: Secondary | ICD-10-CM | POA: Diagnosis not present

## 2020-11-28 DIAGNOSIS — G4700 Insomnia, unspecified: Secondary | ICD-10-CM | POA: Diagnosis not present

## 2020-11-28 DIAGNOSIS — K5900 Constipation, unspecified: Secondary | ICD-10-CM | POA: Diagnosis not present

## 2020-11-28 DIAGNOSIS — I4891 Unspecified atrial fibrillation: Secondary | ICD-10-CM | POA: Diagnosis not present

## 2020-11-28 DIAGNOSIS — R040 Epistaxis: Secondary | ICD-10-CM | POA: Diagnosis not present

## 2020-12-01 DIAGNOSIS — M79641 Pain in right hand: Secondary | ICD-10-CM | POA: Diagnosis not present

## 2020-12-01 DIAGNOSIS — M25551 Pain in right hip: Secondary | ICD-10-CM | POA: Diagnosis not present

## 2020-12-03 DIAGNOSIS — K5900 Constipation, unspecified: Secondary | ICD-10-CM | POA: Diagnosis not present

## 2020-12-03 DIAGNOSIS — G4700 Insomnia, unspecified: Secondary | ICD-10-CM | POA: Diagnosis not present

## 2020-12-03 DIAGNOSIS — R5381 Other malaise: Secondary | ICD-10-CM | POA: Diagnosis not present

## 2020-12-03 DIAGNOSIS — E559 Vitamin D deficiency, unspecified: Secondary | ICD-10-CM | POA: Diagnosis not present

## 2020-12-03 DIAGNOSIS — R296 Repeated falls: Secondary | ICD-10-CM | POA: Diagnosis not present

## 2020-12-03 DIAGNOSIS — R1310 Dysphagia, unspecified: Secondary | ICD-10-CM | POA: Diagnosis not present

## 2020-12-03 DIAGNOSIS — M1990 Unspecified osteoarthritis, unspecified site: Secondary | ICD-10-CM | POA: Diagnosis not present

## 2020-12-03 DIAGNOSIS — D508 Other iron deficiency anemias: Secondary | ICD-10-CM | POA: Diagnosis not present

## 2020-12-03 DIAGNOSIS — K219 Gastro-esophageal reflux disease without esophagitis: Secondary | ICD-10-CM | POA: Diagnosis not present

## 2020-12-03 DIAGNOSIS — E119 Type 2 diabetes mellitus without complications: Secondary | ICD-10-CM | POA: Diagnosis not present

## 2020-12-03 DIAGNOSIS — F329 Major depressive disorder, single episode, unspecified: Secondary | ICD-10-CM | POA: Diagnosis not present

## 2020-12-04 DIAGNOSIS — R2681 Unsteadiness on feet: Secondary | ICD-10-CM | POA: Diagnosis not present

## 2020-12-04 DIAGNOSIS — N390 Urinary tract infection, site not specified: Secondary | ICD-10-CM | POA: Diagnosis not present

## 2020-12-04 DIAGNOSIS — M6281 Muscle weakness (generalized): Secondary | ICD-10-CM | POA: Diagnosis not present

## 2020-12-06 DIAGNOSIS — R1310 Dysphagia, unspecified: Secondary | ICD-10-CM | POA: Diagnosis not present

## 2020-12-06 DIAGNOSIS — E559 Vitamin D deficiency, unspecified: Secondary | ICD-10-CM | POA: Diagnosis not present

## 2020-12-06 DIAGNOSIS — K219 Gastro-esophageal reflux disease without esophagitis: Secondary | ICD-10-CM | POA: Diagnosis not present

## 2020-12-06 DIAGNOSIS — F329 Major depressive disorder, single episode, unspecified: Secondary | ICD-10-CM | POA: Diagnosis not present

## 2020-12-06 DIAGNOSIS — I10 Essential (primary) hypertension: Secondary | ICD-10-CM | POA: Diagnosis not present

## 2020-12-06 DIAGNOSIS — R5381 Other malaise: Secondary | ICD-10-CM | POA: Diagnosis not present

## 2020-12-06 DIAGNOSIS — E1142 Type 2 diabetes mellitus with diabetic polyneuropathy: Secondary | ICD-10-CM | POA: Diagnosis not present

## 2020-12-06 DIAGNOSIS — M1990 Unspecified osteoarthritis, unspecified site: Secondary | ICD-10-CM | POA: Diagnosis not present

## 2020-12-06 DIAGNOSIS — E119 Type 2 diabetes mellitus without complications: Secondary | ICD-10-CM | POA: Diagnosis not present

## 2020-12-06 DIAGNOSIS — G8929 Other chronic pain: Secondary | ICD-10-CM | POA: Diagnosis not present

## 2020-12-06 DIAGNOSIS — R296 Repeated falls: Secondary | ICD-10-CM | POA: Diagnosis not present

## 2020-12-07 DIAGNOSIS — M1990 Unspecified osteoarthritis, unspecified site: Secondary | ICD-10-CM | POA: Diagnosis not present

## 2020-12-07 DIAGNOSIS — F339 Major depressive disorder, recurrent, unspecified: Secondary | ICD-10-CM | POA: Diagnosis not present

## 2020-12-07 DIAGNOSIS — E559 Vitamin D deficiency, unspecified: Secondary | ICD-10-CM | POA: Diagnosis not present

## 2020-12-07 DIAGNOSIS — I639 Cerebral infarction, unspecified: Secondary | ICD-10-CM | POA: Diagnosis not present

## 2020-12-07 DIAGNOSIS — I4891 Unspecified atrial fibrillation: Secondary | ICD-10-CM | POA: Diagnosis not present

## 2020-12-07 DIAGNOSIS — I10 Essential (primary) hypertension: Secondary | ICD-10-CM | POA: Diagnosis not present

## 2020-12-07 DIAGNOSIS — E119 Type 2 diabetes mellitus without complications: Secondary | ICD-10-CM | POA: Diagnosis not present

## 2020-12-07 DIAGNOSIS — F329 Major depressive disorder, single episode, unspecified: Secondary | ICD-10-CM | POA: Diagnosis not present

## 2020-12-07 DIAGNOSIS — I5032 Chronic diastolic (congestive) heart failure: Secondary | ICD-10-CM | POA: Diagnosis not present

## 2020-12-07 DIAGNOSIS — D508 Other iron deficiency anemias: Secondary | ICD-10-CM | POA: Diagnosis not present

## 2020-12-07 DIAGNOSIS — E1142 Type 2 diabetes mellitus with diabetic polyneuropathy: Secondary | ICD-10-CM | POA: Diagnosis not present

## 2020-12-11 DIAGNOSIS — R300 Dysuria: Secondary | ICD-10-CM | POA: Diagnosis not present

## 2020-12-11 DIAGNOSIS — N390 Urinary tract infection, site not specified: Secondary | ICD-10-CM | POA: Diagnosis not present

## 2020-12-13 ENCOUNTER — Telehealth: Payer: PPO

## 2020-12-24 DIAGNOSIS — K219 Gastro-esophageal reflux disease without esophagitis: Secondary | ICD-10-CM | POA: Diagnosis not present

## 2020-12-24 DIAGNOSIS — R296 Repeated falls: Secondary | ICD-10-CM | POA: Diagnosis not present

## 2020-12-24 DIAGNOSIS — R5381 Other malaise: Secondary | ICD-10-CM | POA: Diagnosis not present

## 2020-12-24 DIAGNOSIS — I10 Essential (primary) hypertension: Secondary | ICD-10-CM | POA: Diagnosis not present

## 2020-12-24 DIAGNOSIS — E559 Vitamin D deficiency, unspecified: Secondary | ICD-10-CM | POA: Diagnosis not present

## 2020-12-24 DIAGNOSIS — R350 Frequency of micturition: Secondary | ICD-10-CM | POA: Diagnosis not present

## 2020-12-24 DIAGNOSIS — R3911 Hesitancy of micturition: Secondary | ICD-10-CM | POA: Diagnosis not present

## 2020-12-24 DIAGNOSIS — K5900 Constipation, unspecified: Secondary | ICD-10-CM | POA: Diagnosis not present

## 2020-12-24 DIAGNOSIS — R1310 Dysphagia, unspecified: Secondary | ICD-10-CM | POA: Diagnosis not present

## 2020-12-24 DIAGNOSIS — G4700 Insomnia, unspecified: Secondary | ICD-10-CM | POA: Diagnosis not present

## 2020-12-24 DIAGNOSIS — R300 Dysuria: Secondary | ICD-10-CM | POA: Diagnosis not present

## 2020-12-24 DIAGNOSIS — E119 Type 2 diabetes mellitus without complications: Secondary | ICD-10-CM | POA: Diagnosis not present

## 2020-12-25 DIAGNOSIS — G4720 Circadian rhythm sleep disorder, unspecified type: Secondary | ICD-10-CM | POA: Diagnosis not present

## 2020-12-25 DIAGNOSIS — F329 Major depressive disorder, single episode, unspecified: Secondary | ICD-10-CM | POA: Diagnosis not present

## 2020-12-25 DIAGNOSIS — N390 Urinary tract infection, site not specified: Secondary | ICD-10-CM | POA: Diagnosis not present

## 2020-12-25 DIAGNOSIS — G8929 Other chronic pain: Secondary | ICD-10-CM | POA: Diagnosis not present

## 2020-12-31 DIAGNOSIS — M2042 Other hammer toe(s) (acquired), left foot: Secondary | ICD-10-CM | POA: Diagnosis not present

## 2020-12-31 DIAGNOSIS — B351 Tinea unguium: Secondary | ICD-10-CM | POA: Diagnosis not present

## 2020-12-31 DIAGNOSIS — E1159 Type 2 diabetes mellitus with other circulatory complications: Secondary | ICD-10-CM | POA: Diagnosis not present

## 2020-12-31 DIAGNOSIS — M2041 Other hammer toe(s) (acquired), right foot: Secondary | ICD-10-CM | POA: Diagnosis not present

## 2021-01-02 DIAGNOSIS — N3020 Other chronic cystitis without hematuria: Secondary | ICD-10-CM | POA: Diagnosis not present

## 2021-01-02 DIAGNOSIS — N952 Postmenopausal atrophic vaginitis: Secondary | ICD-10-CM | POA: Diagnosis not present

## 2021-01-02 DIAGNOSIS — R3914 Feeling of incomplete bladder emptying: Secondary | ICD-10-CM | POA: Diagnosis not present

## 2021-01-03 DIAGNOSIS — K219 Gastro-esophageal reflux disease without esophagitis: Secondary | ICD-10-CM | POA: Diagnosis not present

## 2021-01-03 DIAGNOSIS — I4891 Unspecified atrial fibrillation: Secondary | ICD-10-CM | POA: Diagnosis not present

## 2021-01-03 DIAGNOSIS — K5900 Constipation, unspecified: Secondary | ICD-10-CM | POA: Diagnosis not present

## 2021-01-03 DIAGNOSIS — R300 Dysuria: Secondary | ICD-10-CM | POA: Diagnosis not present

## 2021-01-03 DIAGNOSIS — R2681 Unsteadiness on feet: Secondary | ICD-10-CM | POA: Diagnosis not present

## 2021-01-03 DIAGNOSIS — M1990 Unspecified osteoarthritis, unspecified site: Secondary | ICD-10-CM | POA: Diagnosis not present

## 2021-01-03 DIAGNOSIS — G4700 Insomnia, unspecified: Secondary | ICD-10-CM | POA: Diagnosis not present

## 2021-01-03 DIAGNOSIS — R41841 Cognitive communication deficit: Secondary | ICD-10-CM | POA: Diagnosis not present

## 2021-01-03 DIAGNOSIS — M6281 Muscle weakness (generalized): Secondary | ICD-10-CM | POA: Diagnosis not present

## 2021-01-03 DIAGNOSIS — G8929 Other chronic pain: Secondary | ICD-10-CM | POA: Diagnosis not present

## 2021-01-03 DIAGNOSIS — R040 Epistaxis: Secondary | ICD-10-CM | POA: Diagnosis not present

## 2021-01-03 DIAGNOSIS — E119 Type 2 diabetes mellitus without complications: Secondary | ICD-10-CM | POA: Diagnosis not present

## 2021-01-03 DIAGNOSIS — R1312 Dysphagia, oropharyngeal phase: Secondary | ICD-10-CM | POA: Diagnosis not present

## 2021-01-03 DIAGNOSIS — R350 Frequency of micturition: Secondary | ICD-10-CM | POA: Diagnosis not present

## 2021-01-03 DIAGNOSIS — E1169 Type 2 diabetes mellitus with other specified complication: Secondary | ICD-10-CM | POA: Diagnosis not present

## 2021-01-03 DIAGNOSIS — E559 Vitamin D deficiency, unspecified: Secondary | ICD-10-CM | POA: Diagnosis not present

## 2021-01-08 DIAGNOSIS — D508 Other iron deficiency anemias: Secondary | ICD-10-CM | POA: Diagnosis not present

## 2021-01-08 DIAGNOSIS — I5032 Chronic diastolic (congestive) heart failure: Secondary | ICD-10-CM | POA: Diagnosis not present

## 2021-01-08 DIAGNOSIS — F339 Major depressive disorder, recurrent, unspecified: Secondary | ICD-10-CM | POA: Diagnosis not present

## 2021-01-08 DIAGNOSIS — M1990 Unspecified osteoarthritis, unspecified site: Secondary | ICD-10-CM | POA: Diagnosis not present

## 2021-01-08 DIAGNOSIS — I4891 Unspecified atrial fibrillation: Secondary | ICD-10-CM | POA: Diagnosis not present

## 2021-01-08 DIAGNOSIS — I10 Essential (primary) hypertension: Secondary | ICD-10-CM | POA: Diagnosis not present

## 2021-01-08 DIAGNOSIS — E1142 Type 2 diabetes mellitus with diabetic polyneuropathy: Secondary | ICD-10-CM | POA: Diagnosis not present

## 2021-01-08 DIAGNOSIS — E119 Type 2 diabetes mellitus without complications: Secondary | ICD-10-CM | POA: Diagnosis not present

## 2021-01-08 DIAGNOSIS — I639 Cerebral infarction, unspecified: Secondary | ICD-10-CM | POA: Diagnosis not present

## 2021-01-08 DIAGNOSIS — F329 Major depressive disorder, single episode, unspecified: Secondary | ICD-10-CM | POA: Diagnosis not present

## 2021-01-08 DIAGNOSIS — E559 Vitamin D deficiency, unspecified: Secondary | ICD-10-CM | POA: Diagnosis not present

## 2021-01-23 ENCOUNTER — Telehealth: Payer: PPO

## 2021-01-23 DIAGNOSIS — D508 Other iron deficiency anemias: Secondary | ICD-10-CM | POA: Diagnosis not present

## 2021-01-23 DIAGNOSIS — G4700 Insomnia, unspecified: Secondary | ICD-10-CM | POA: Diagnosis not present

## 2021-01-23 DIAGNOSIS — R1310 Dysphagia, unspecified: Secondary | ICD-10-CM | POA: Diagnosis not present

## 2021-01-23 DIAGNOSIS — E559 Vitamin D deficiency, unspecified: Secondary | ICD-10-CM | POA: Diagnosis not present

## 2021-01-23 DIAGNOSIS — K5900 Constipation, unspecified: Secondary | ICD-10-CM | POA: Diagnosis not present

## 2021-01-23 DIAGNOSIS — M1990 Unspecified osteoarthritis, unspecified site: Secondary | ICD-10-CM | POA: Diagnosis not present

## 2021-01-23 DIAGNOSIS — F329 Major depressive disorder, single episode, unspecified: Secondary | ICD-10-CM | POA: Diagnosis not present

## 2021-01-23 DIAGNOSIS — E119 Type 2 diabetes mellitus without complications: Secondary | ICD-10-CM | POA: Diagnosis not present

## 2021-01-23 DIAGNOSIS — R5381 Other malaise: Secondary | ICD-10-CM | POA: Diagnosis not present

## 2021-01-23 DIAGNOSIS — K219 Gastro-esophageal reflux disease without esophagitis: Secondary | ICD-10-CM | POA: Diagnosis not present

## 2021-01-29 DIAGNOSIS — G8929 Other chronic pain: Secondary | ICD-10-CM | POA: Diagnosis not present

## 2021-01-29 DIAGNOSIS — G4720 Circadian rhythm sleep disorder, unspecified type: Secondary | ICD-10-CM | POA: Diagnosis not present

## 2021-01-29 DIAGNOSIS — F329 Major depressive disorder, single episode, unspecified: Secondary | ICD-10-CM | POA: Diagnosis not present

## 2021-02-04 DIAGNOSIS — R1312 Dysphagia, oropharyngeal phase: Secondary | ICD-10-CM | POA: Diagnosis not present

## 2021-02-04 DIAGNOSIS — R41841 Cognitive communication deficit: Secondary | ICD-10-CM | POA: Diagnosis not present

## 2021-02-04 DIAGNOSIS — E1169 Type 2 diabetes mellitus with other specified complication: Secondary | ICD-10-CM | POA: Diagnosis not present

## 2021-02-07 DIAGNOSIS — I10 Essential (primary) hypertension: Secondary | ICD-10-CM | POA: Diagnosis not present

## 2021-02-07 DIAGNOSIS — E329 Disease of thymus, unspecified: Secondary | ICD-10-CM | POA: Diagnosis not present

## 2021-02-07 DIAGNOSIS — K219 Gastro-esophageal reflux disease without esophagitis: Secondary | ICD-10-CM | POA: Diagnosis not present

## 2021-02-07 DIAGNOSIS — Z79899 Other long term (current) drug therapy: Secondary | ICD-10-CM | POA: Diagnosis not present

## 2021-02-07 DIAGNOSIS — E785 Hyperlipidemia, unspecified: Secondary | ICD-10-CM | POA: Diagnosis not present

## 2021-02-07 DIAGNOSIS — R1310 Dysphagia, unspecified: Secondary | ICD-10-CM | POA: Diagnosis not present

## 2021-02-07 DIAGNOSIS — E119 Type 2 diabetes mellitus without complications: Secondary | ICD-10-CM | POA: Diagnosis not present

## 2021-02-07 DIAGNOSIS — I4891 Unspecified atrial fibrillation: Secondary | ICD-10-CM | POA: Diagnosis not present

## 2021-02-07 DIAGNOSIS — R7989 Other specified abnormal findings of blood chemistry: Secondary | ICD-10-CM | POA: Diagnosis not present

## 2021-02-07 DIAGNOSIS — E559 Vitamin D deficiency, unspecified: Secondary | ICD-10-CM | POA: Diagnosis not present

## 2021-02-07 DIAGNOSIS — E1142 Type 2 diabetes mellitus with diabetic polyneuropathy: Secondary | ICD-10-CM | POA: Diagnosis not present

## 2021-02-07 DIAGNOSIS — K5900 Constipation, unspecified: Secondary | ICD-10-CM | POA: Diagnosis not present

## 2021-02-08 DIAGNOSIS — E1142 Type 2 diabetes mellitus with diabetic polyneuropathy: Secondary | ICD-10-CM | POA: Diagnosis not present

## 2021-02-08 DIAGNOSIS — I4891 Unspecified atrial fibrillation: Secondary | ICD-10-CM | POA: Diagnosis not present

## 2021-02-08 DIAGNOSIS — M1990 Unspecified osteoarthritis, unspecified site: Secondary | ICD-10-CM | POA: Diagnosis not present

## 2021-02-08 DIAGNOSIS — I10 Essential (primary) hypertension: Secondary | ICD-10-CM | POA: Diagnosis not present

## 2021-02-08 DIAGNOSIS — I5032 Chronic diastolic (congestive) heart failure: Secondary | ICD-10-CM | POA: Diagnosis not present

## 2021-02-08 DIAGNOSIS — D508 Other iron deficiency anemias: Secondary | ICD-10-CM | POA: Diagnosis not present

## 2021-02-08 DIAGNOSIS — F329 Major depressive disorder, single episode, unspecified: Secondary | ICD-10-CM | POA: Diagnosis not present

## 2021-02-08 DIAGNOSIS — E119 Type 2 diabetes mellitus without complications: Secondary | ICD-10-CM | POA: Diagnosis not present

## 2021-02-08 DIAGNOSIS — I639 Cerebral infarction, unspecified: Secondary | ICD-10-CM | POA: Diagnosis not present

## 2021-02-08 DIAGNOSIS — F339 Major depressive disorder, recurrent, unspecified: Secondary | ICD-10-CM | POA: Diagnosis not present

## 2021-02-08 DIAGNOSIS — E559 Vitamin D deficiency, unspecified: Secondary | ICD-10-CM | POA: Diagnosis not present

## 2021-02-13 DIAGNOSIS — N390 Urinary tract infection, site not specified: Secondary | ICD-10-CM | POA: Diagnosis not present

## 2021-02-13 DIAGNOSIS — B964 Proteus (mirabilis) (morganii) as the cause of diseases classified elsewhere: Secondary | ICD-10-CM | POA: Diagnosis not present

## 2021-02-18 DIAGNOSIS — R1310 Dysphagia, unspecified: Secondary | ICD-10-CM | POA: Diagnosis not present

## 2021-02-18 DIAGNOSIS — I4891 Unspecified atrial fibrillation: Secondary | ICD-10-CM | POA: Diagnosis not present

## 2021-02-18 DIAGNOSIS — G8929 Other chronic pain: Secondary | ICD-10-CM | POA: Diagnosis not present

## 2021-02-18 DIAGNOSIS — K219 Gastro-esophageal reflux disease without esophagitis: Secondary | ICD-10-CM | POA: Diagnosis not present

## 2021-02-18 DIAGNOSIS — K5900 Constipation, unspecified: Secondary | ICD-10-CM | POA: Diagnosis not present

## 2021-02-18 DIAGNOSIS — E559 Vitamin D deficiency, unspecified: Secondary | ICD-10-CM | POA: Diagnosis not present

## 2021-02-18 DIAGNOSIS — I10 Essential (primary) hypertension: Secondary | ICD-10-CM | POA: Diagnosis not present

## 2021-02-18 DIAGNOSIS — F329 Major depressive disorder, single episode, unspecified: Secondary | ICD-10-CM | POA: Diagnosis not present

## 2021-02-18 DIAGNOSIS — M25512 Pain in left shoulder: Secondary | ICD-10-CM | POA: Diagnosis not present

## 2021-02-18 DIAGNOSIS — N390 Urinary tract infection, site not specified: Secondary | ICD-10-CM | POA: Diagnosis not present

## 2021-02-18 DIAGNOSIS — M25511 Pain in right shoulder: Secondary | ICD-10-CM | POA: Diagnosis not present

## 2021-02-18 DIAGNOSIS — E119 Type 2 diabetes mellitus without complications: Secondary | ICD-10-CM | POA: Diagnosis not present

## 2021-02-19 DIAGNOSIS — M25552 Pain in left hip: Secondary | ICD-10-CM | POA: Diagnosis not present

## 2021-02-19 DIAGNOSIS — M25512 Pain in left shoulder: Secondary | ICD-10-CM | POA: Diagnosis not present

## 2021-02-19 DIAGNOSIS — M25511 Pain in right shoulder: Secondary | ICD-10-CM | POA: Diagnosis not present

## 2021-02-19 DIAGNOSIS — M546 Pain in thoracic spine: Secondary | ICD-10-CM | POA: Diagnosis not present

## 2021-02-19 DIAGNOSIS — M542 Cervicalgia: Secondary | ICD-10-CM | POA: Diagnosis not present

## 2021-02-19 DIAGNOSIS — M545 Low back pain, unspecified: Secondary | ICD-10-CM | POA: Diagnosis not present

## 2021-02-19 DIAGNOSIS — M25551 Pain in right hip: Secondary | ICD-10-CM | POA: Diagnosis not present

## 2021-02-22 ENCOUNTER — Non-Acute Institutional Stay: Payer: PPO

## 2021-02-22 ENCOUNTER — Other Ambulatory Visit: Payer: Self-pay

## 2021-02-25 ENCOUNTER — Other Ambulatory Visit: Payer: Self-pay

## 2021-02-25 ENCOUNTER — Non-Acute Institutional Stay: Payer: PPO

## 2021-02-25 VITALS — BP 140/80 | HR 68 | Temp 97.5°F | Resp 18 | Wt 133.2 lb

## 2021-02-25 DIAGNOSIS — Z515 Encounter for palliative care: Secondary | ICD-10-CM

## 2021-02-26 DIAGNOSIS — G4720 Circadian rhythm sleep disorder, unspecified type: Secondary | ICD-10-CM | POA: Diagnosis not present

## 2021-02-26 DIAGNOSIS — G8929 Other chronic pain: Secondary | ICD-10-CM | POA: Diagnosis not present

## 2021-02-26 DIAGNOSIS — F329 Major depressive disorder, single episode, unspecified: Secondary | ICD-10-CM | POA: Diagnosis not present

## 2021-02-26 NOTE — Progress Notes (Signed)
PATIENT NAME: [name] DOB: Sep 18, 1925 MRN: [id number]  PRIMARY CARE PROVIDER: Ivy Lynn, NP  RESPONSIBLE PARTY:  Acct ID - Guarantor Home Phone Work Phone Relationship Acct Type  [id number] - ELLERMAN,* [phone]  Self P/F     [facility], [place], [place], [place] [address]    COMMUNITY PALLIATIVE CARE RN PLAN OF CARE and INTERVENTION:  ADVANCE CARE PLANNING/GOALS OF CARE: Comfort and maintain function PATIENT/CAREGIVER EDUCATION: Education regarding Palliative care services, safety DISEASE STATUS:  RN Palliative care visit completed today. Met with patient in her room at [facility] Found patient in her chair. Alert and oriented x 4. Verbally engaging, able to make her needs known. Patient smiling and in pleasant mood. Patient reports some pain to her back and joints. She is taking pain medication in the am which she reports as effective. She stated she would like to have another pain pill in the afternoon/evening. This was communicated to [facility]. Patient states that she continues to have falls but is now very cautious when she gets up. X rays were completed on this past Friday which Joy RN, Supervisor, reported as no acute findings. Patient expressed being content to be in her own room. Patient expressed finding hope and support in her faith. Patient voiced no new concerns.     HISTORY OF PRESENT ILLNESS: This is a 87 year old female residing at SNF with diagnoses including but not limited to A-fib, not on coagulation, type 2 diabetes, hypertension, and arthritis.  Palliative care has been asked to follow for additional support, goals of care and complex decision making.  CODE STATUS: DNR in place PPS: 40%    PHYSICAL EXAM:   Lungs: CTA, no audible congest  CV: RRR  Skin: cool, dry, no lesions or rashes  Neuro: Positive for weakness, Alert and Oriented x 3        Maxwell Caul RN, BSN

## 2021-03-04 ENCOUNTER — Emergency Department (HOSPITAL_COMMUNITY): Payer: PPO

## 2021-03-04 ENCOUNTER — Other Ambulatory Visit: Payer: Self-pay

## 2021-03-04 ENCOUNTER — Emergency Department (HOSPITAL_COMMUNITY)
Admission: EM | Admit: 2021-03-04 | Discharge: 2021-03-05 | Disposition: A | Discharge status: Home / Self Care | Payer: PPO | Attending: Emergency Medicine | Admitting: Emergency Medicine

## 2021-03-04 DIAGNOSIS — S0003XA Contusion of scalp, initial encounter: Secondary | ICD-10-CM | POA: Diagnosis not present

## 2021-03-04 DIAGNOSIS — R001 Bradycardia, unspecified: Secondary | ICD-10-CM | POA: Diagnosis not present

## 2021-03-04 DIAGNOSIS — R519 Headache, unspecified: Secondary | ICD-10-CM | POA: Insufficient documentation

## 2021-03-04 DIAGNOSIS — S0990XA Unspecified injury of head, initial encounter: Secondary | ICD-10-CM | POA: Diagnosis not present

## 2021-03-04 DIAGNOSIS — I10 Essential (primary) hypertension: Secondary | ICD-10-CM | POA: Diagnosis not present

## 2021-03-04 DIAGNOSIS — I6523 Occlusion and stenosis of bilateral carotid arteries: Secondary | ICD-10-CM | POA: Diagnosis not present

## 2021-03-04 DIAGNOSIS — W19XXXA Unspecified fall, initial encounter: Secondary | ICD-10-CM | POA: Insufficient documentation

## 2021-03-04 DIAGNOSIS — Z5321 Procedure and treatment not carried out due to patient leaving prior to being seen by health care provider: Secondary | ICD-10-CM | POA: Diagnosis not present

## 2021-03-04 DIAGNOSIS — I959 Hypotension, unspecified: Secondary | ICD-10-CM | POA: Diagnosis not present

## 2021-03-04 DIAGNOSIS — M25551 Pain in right hip: Secondary | ICD-10-CM | POA: Diagnosis not present

## 2021-03-04 DIAGNOSIS — I4891 Unspecified atrial fibrillation: Secondary | ICD-10-CM | POA: Diagnosis not present

## 2021-03-04 DIAGNOSIS — R0902 Hypoxemia: Secondary | ICD-10-CM | POA: Diagnosis not present

## 2021-03-04 DIAGNOSIS — M25552 Pain in left hip: Secondary | ICD-10-CM | POA: Diagnosis not present

## 2021-03-04 LAB — BASIC METABOLIC PANEL
Anion gap: 11 (ref 5–15)
BUN: 19 mg/dL (ref 8–23)
CO2: 24 mmol/L (ref 22–32)
Calcium: 9.3 mg/dL (ref 8.9–10.3)
Chloride: 102 mmol/L (ref 98–111)
Creatinine, Ser: 0.8 mg/dL (ref 0.44–1.00)
GFR, Estimated: >60 mL/min (ref >60)
Glucose, Bld: 122 mg/dL — ABNORMAL HIGH (ref 70–99)
Potassium: 3.4 mmol/L — ABNORMAL LOW (ref 3.5–5.1)
Sodium: 137 mmol/L (ref 135–145)

## 2021-03-04 LAB — CBC WITH DIFFERENTIAL/PLATELET
Abs Immature Granulocytes: 0.01 10*3/uL (ref 0.00–0.07)
Basophils Absolute: 0 10*3/uL (ref 0.0–0.1)
Basophils Relative: 0 %
Eosinophils Absolute: 0.7 10*3/uL — ABNORMAL HIGH (ref 0.0–0.5)
Eosinophils Relative: 11 %
HCT: 37.7 % (ref 36.0–46.0)
Hemoglobin: 11.6 g/dL — ABNORMAL LOW (ref 12.0–15.0)
Immature Granulocytes: 0 %
Lymphocytes Relative: 19 %
Lymphs Abs: 1.2 10*3/uL (ref 0.7–4.0)
MCH: 27.4 pg (ref 26.0–34.0)
MCHC: 30.8 g/dL (ref 30.0–36.0)
MCV: 88.9 fL (ref 80.0–100.0)
Monocytes Absolute: 0.8 10*3/uL (ref 0.1–1.0)
Monocytes Relative: 13 %
Neutro Abs: 3.6 10*3/uL (ref 1.7–7.7)
Neutrophils Relative %: 57 %
Platelets: 179 10*3/uL (ref 150–400)
RBC: 4.24 MIL/uL (ref 3.87–5.11)
RDW: 16 % — ABNORMAL HIGH (ref 11.5–15.5)
WBC: 6.3 10*3/uL (ref 4.0–10.5)
nRBC: 0 % (ref 0.0–0.2)

## 2021-03-04 NOTE — ED Triage Notes (Signed)
Pt via EMS from [facility] for eval of fall at 1400 today, unwitnessed. Pt reports she went to close her door and fell backwards, hitting her head. Denies LOC or blood thinners. C/o pain to back of head. Pt bradycardic with EMS and is unable to recall hx of same. A/O x 4.

## 2021-03-04 NOTE — ED Provider Notes (Signed)
Emergency Medicine Provider Triage Evaluation Note  [name] , a 87 y.o. female  was evaluated in triage.  Pt complains of headache after fall at 2 PM today.  [facility] and [facility].  She states that she got up to close her door and fell backwards and hit her head.  Denies loss of consciousness.  She is not on chronic anticoagulation.  Of note she was bradycardic with EMS, and states she does not have history of this.  Is also complaining of right hip pain for several weeks, is unsure if this is related to fall she has had approximately 15 falls in past 6 months.  Review of Systems  Positive: Headache Negative: Lightheadedness, dizziness, neck pain, back pain  Physical Exam  BP (!) 173/68 (BP Location: Right Arm)   Pulse (!) 48   Temp 98.3 F (36.8 C)   Resp 18   SpO2 92%  Gen:   Awake, no distress   Resp:  Normal effort  MSK:   Moves extremities without difficulty  Other:  5/5 strength in all extremities, PERRLA, EOMI, hematoma noted with tenderness over the right temporal bone  Medical Decision Making  Medically screening exam initiated at 6:30 PM.  Appropriate orders placed.  [name] was informed that the remainder of the evaluation will be completed by another provider, this initial triage assessment does not replace that evaluation, and the importance of remaining in the ED until their evaluation is complete.    Estill Cotta 03/04/21 1832    Dorie Rank, MD 03/05/21 1527

## 2021-03-05 IMAGING — DX DG HIP (WITH OR WITHOUT PELVIS) 2-3V*L*
3 series · 3 of 3 positions shown · non-contrast
Comparison: Right hip radiograph dated 07/14/2017.

CLINICAL DATA: [AGE] female with left hip pain.

EXAM:
DG HIP (WITH OR WITHOUT PELVIS) 2-3V LEFT

[pelvis ap]
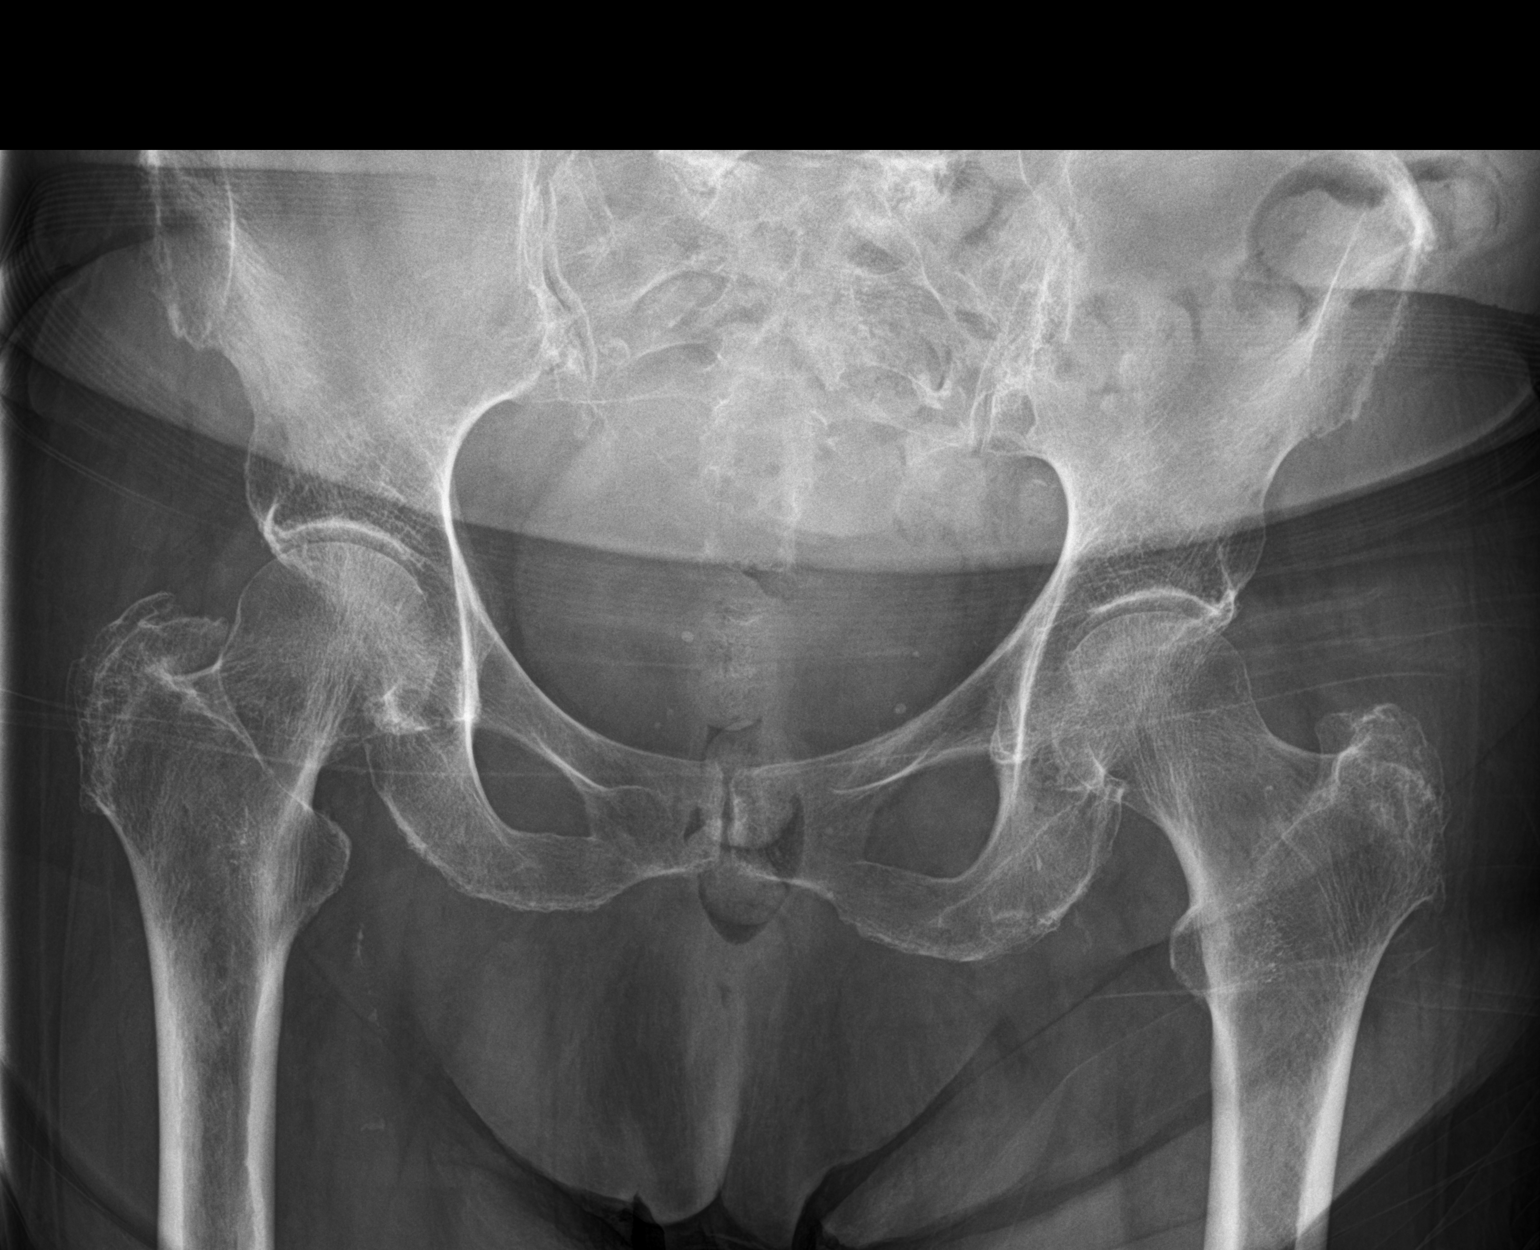

[hip ap]
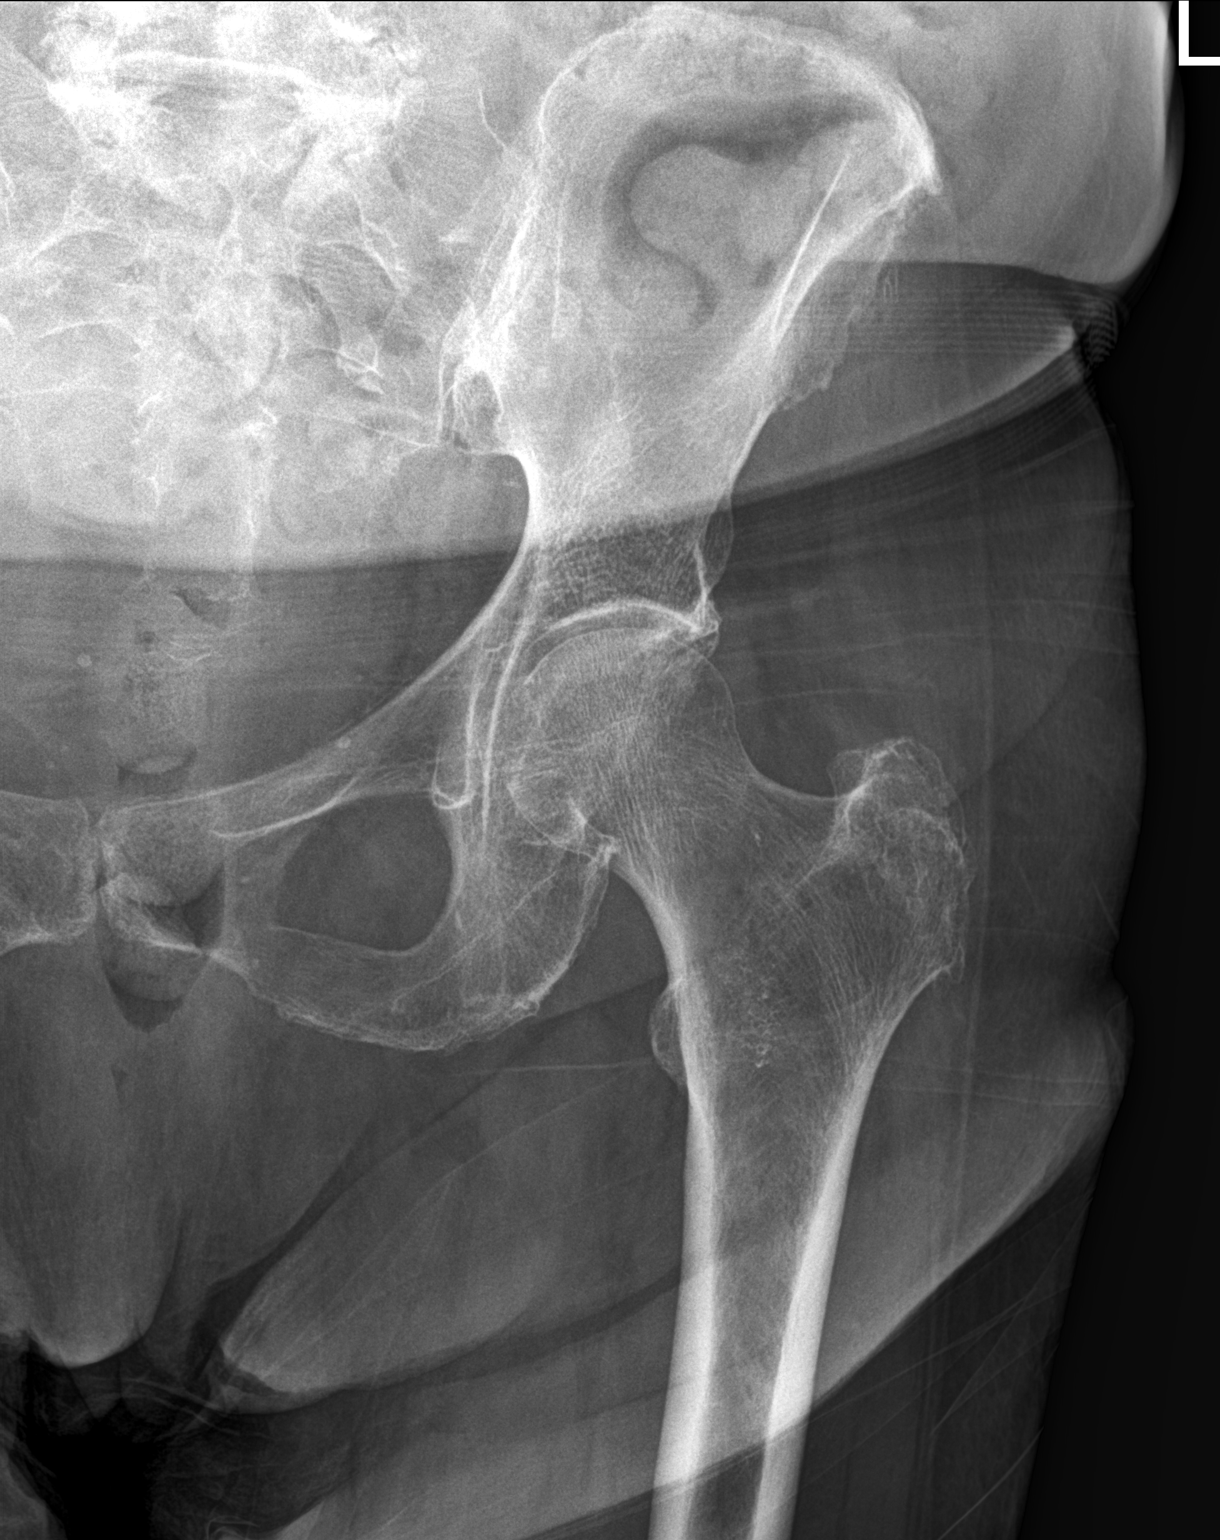

[hip lat]
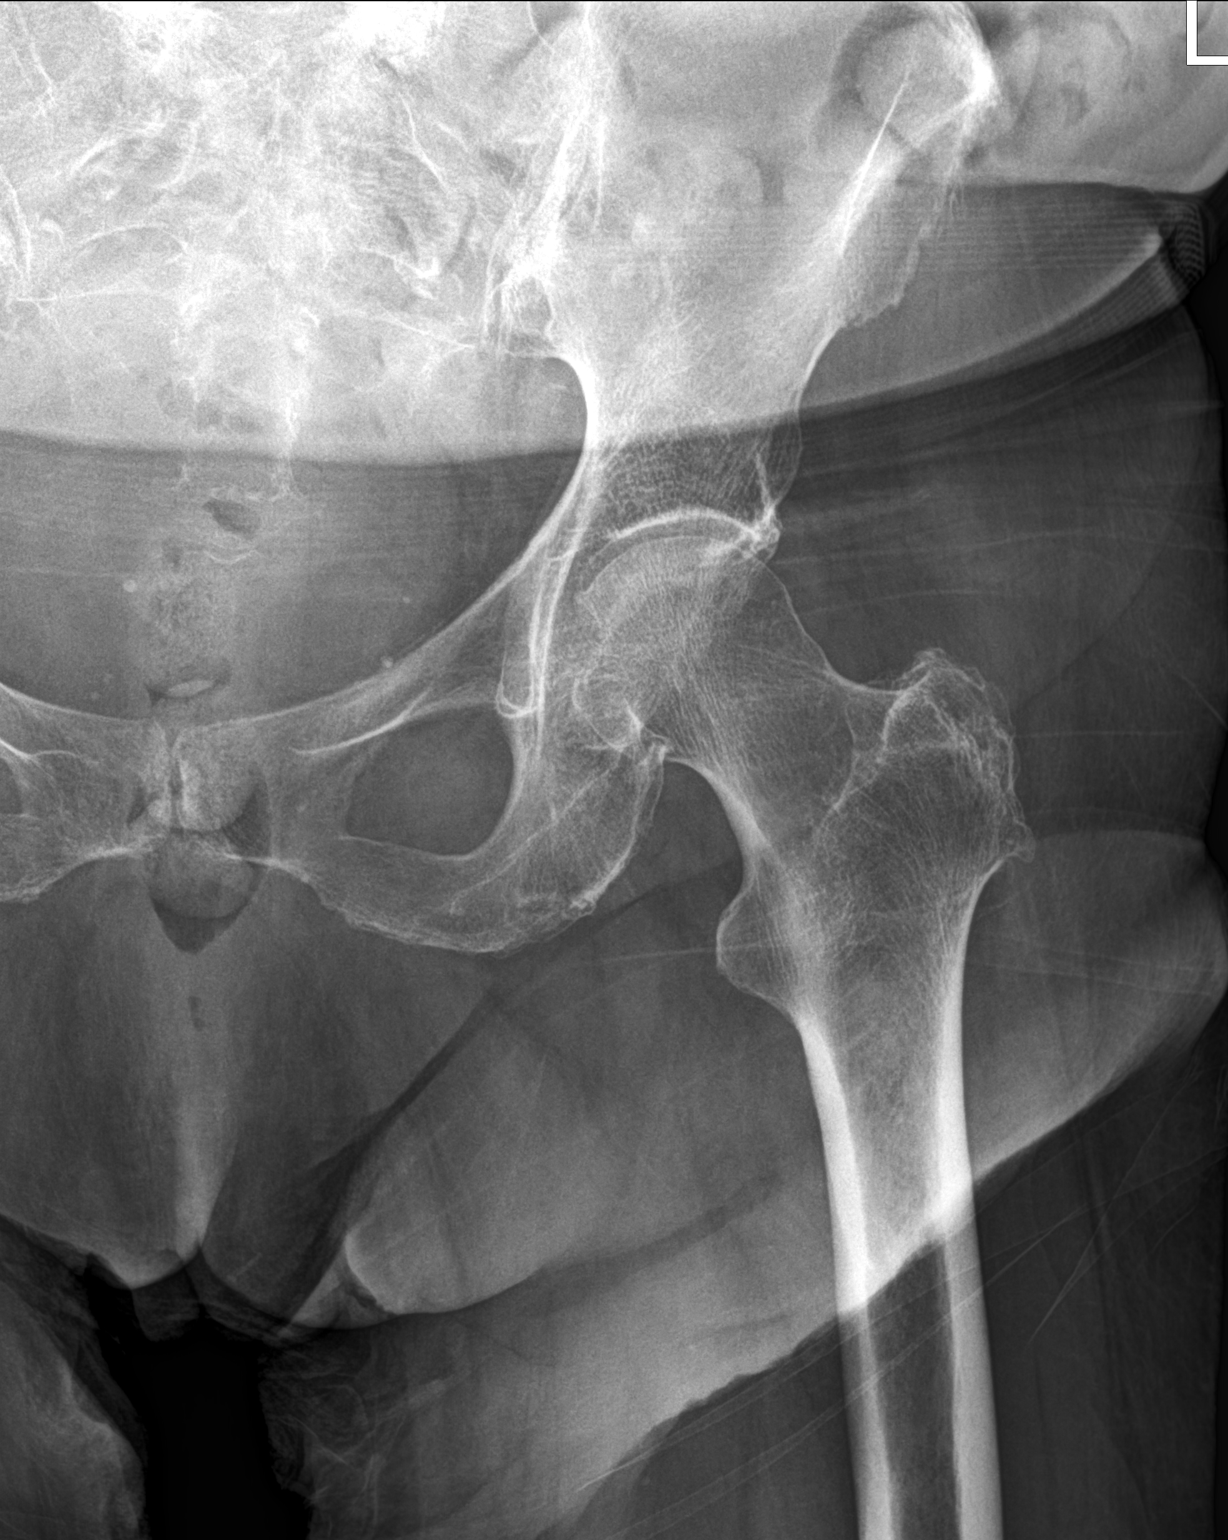

[3 of 3 positions shown; findings below may reference images not displayed]

FINDINGS: There is no acute fracture or dislocation. The bones are osteopenic.
Mild bilateral hip arthritic changes. Degenerative changes of the
lower lumbar spine. The soft tissues are unremarkable.
IMPRESSION: No acute fracture or dislocation.

## 2021-03-05 NOTE — ED Notes (Signed)
Pt labels were left at my computer. With left written on back.

## 2021-03-06 DIAGNOSIS — E119 Type 2 diabetes mellitus without complications: Secondary | ICD-10-CM | POA: Diagnosis not present

## 2021-03-06 DIAGNOSIS — R296 Repeated falls: Secondary | ICD-10-CM | POA: Diagnosis not present

## 2021-03-06 DIAGNOSIS — K5900 Constipation, unspecified: Secondary | ICD-10-CM | POA: Diagnosis not present

## 2021-03-06 DIAGNOSIS — R040 Epistaxis: Secondary | ICD-10-CM | POA: Diagnosis not present

## 2021-03-06 DIAGNOSIS — M1990 Unspecified osteoarthritis, unspecified site: Secondary | ICD-10-CM | POA: Diagnosis not present

## 2021-03-06 DIAGNOSIS — G2581 Restless legs syndrome: Secondary | ICD-10-CM | POA: Diagnosis not present

## 2021-03-06 DIAGNOSIS — R5381 Other malaise: Secondary | ICD-10-CM | POA: Diagnosis not present

## 2021-03-06 DIAGNOSIS — I739 Peripheral vascular disease, unspecified: Secondary | ICD-10-CM | POA: Diagnosis not present

## 2021-03-06 DIAGNOSIS — E559 Vitamin D deficiency, unspecified: Secondary | ICD-10-CM | POA: Diagnosis not present

## 2021-03-06 DIAGNOSIS — K219 Gastro-esophageal reflux disease without esophagitis: Secondary | ICD-10-CM | POA: Diagnosis not present

## 2021-03-06 DIAGNOSIS — R1310 Dysphagia, unspecified: Secondary | ICD-10-CM | POA: Diagnosis not present

## 2021-03-07 ENCOUNTER — Telehealth: Payer: PPO

## 2021-03-07 DIAGNOSIS — I10 Essential (primary) hypertension: Secondary | ICD-10-CM | POA: Diagnosis not present

## 2021-03-07 DIAGNOSIS — I482 Chronic atrial fibrillation, unspecified: Secondary | ICD-10-CM | POA: Diagnosis not present

## 2021-03-07 DIAGNOSIS — R5381 Other malaise: Secondary | ICD-10-CM | POA: Diagnosis not present

## 2021-03-07 DIAGNOSIS — D508 Other iron deficiency anemias: Secondary | ICD-10-CM | POA: Diagnosis not present

## 2021-03-07 DIAGNOSIS — Z0130 Encounter for examination of blood pressure without abnormal findings: Secondary | ICD-10-CM | POA: Diagnosis not present

## 2021-03-07 DIAGNOSIS — K5900 Constipation, unspecified: Secondary | ICD-10-CM | POA: Diagnosis not present

## 2021-03-07 DIAGNOSIS — I739 Peripheral vascular disease, unspecified: Secondary | ICD-10-CM | POA: Diagnosis not present

## 2021-03-07 DIAGNOSIS — I4891 Unspecified atrial fibrillation: Secondary | ICD-10-CM | POA: Diagnosis not present

## 2021-03-07 DIAGNOSIS — R1310 Dysphagia, unspecified: Secondary | ICD-10-CM | POA: Diagnosis not present

## 2021-03-07 DIAGNOSIS — R296 Repeated falls: Secondary | ICD-10-CM | POA: Diagnosis not present

## 2021-03-07 DIAGNOSIS — G8929 Other chronic pain: Secondary | ICD-10-CM | POA: Diagnosis not present

## 2021-03-07 DIAGNOSIS — G4700 Insomnia, unspecified: Secondary | ICD-10-CM | POA: Diagnosis not present

## 2021-03-07 DIAGNOSIS — G2581 Restless legs syndrome: Secondary | ICD-10-CM | POA: Diagnosis not present

## 2021-03-07 DIAGNOSIS — R040 Epistaxis: Secondary | ICD-10-CM | POA: Diagnosis not present

## 2021-03-08 DIAGNOSIS — Z79899 Other long term (current) drug therapy: Secondary | ICD-10-CM | POA: Diagnosis not present

## 2021-03-11 DIAGNOSIS — R296 Repeated falls: Secondary | ICD-10-CM | POA: Diagnosis not present

## 2021-03-11 DIAGNOSIS — I10 Essential (primary) hypertension: Secondary | ICD-10-CM | POA: Diagnosis not present

## 2021-03-11 DIAGNOSIS — D508 Other iron deficiency anemias: Secondary | ICD-10-CM | POA: Diagnosis not present

## 2021-03-11 DIAGNOSIS — M1990 Unspecified osteoarthritis, unspecified site: Secondary | ICD-10-CM | POA: Diagnosis not present

## 2021-03-11 DIAGNOSIS — R300 Dysuria: Secondary | ICD-10-CM | POA: Diagnosis not present

## 2021-03-11 DIAGNOSIS — E559 Vitamin D deficiency, unspecified: Secondary | ICD-10-CM | POA: Diagnosis not present

## 2021-03-11 DIAGNOSIS — R5381 Other malaise: Secondary | ICD-10-CM | POA: Diagnosis not present

## 2021-03-11 DIAGNOSIS — E119 Type 2 diabetes mellitus without complications: Secondary | ICD-10-CM | POA: Diagnosis not present

## 2021-03-11 DIAGNOSIS — I951 Orthostatic hypotension: Secondary | ICD-10-CM | POA: Diagnosis not present

## 2021-03-11 DIAGNOSIS — K5900 Constipation, unspecified: Secondary | ICD-10-CM | POA: Diagnosis not present

## 2021-03-11 DIAGNOSIS — K219 Gastro-esophageal reflux disease without esophagitis: Secondary | ICD-10-CM | POA: Diagnosis not present

## 2021-03-11 DIAGNOSIS — R1310 Dysphagia, unspecified: Secondary | ICD-10-CM | POA: Diagnosis not present

## 2021-03-12 DIAGNOSIS — E119 Type 2 diabetes mellitus without complications: Secondary | ICD-10-CM | POA: Diagnosis not present

## 2021-03-12 DIAGNOSIS — M1990 Unspecified osteoarthritis, unspecified site: Secondary | ICD-10-CM | POA: Diagnosis not present

## 2021-03-12 DIAGNOSIS — B9620 Unspecified Escherichia coli [E. coli] as the cause of diseases classified elsewhere: Secondary | ICD-10-CM | POA: Diagnosis not present

## 2021-03-12 DIAGNOSIS — D508 Other iron deficiency anemias: Secondary | ICD-10-CM | POA: Diagnosis not present

## 2021-03-12 DIAGNOSIS — E1142 Type 2 diabetes mellitus with diabetic polyneuropathy: Secondary | ICD-10-CM | POA: Diagnosis not present

## 2021-03-12 DIAGNOSIS — E559 Vitamin D deficiency, unspecified: Secondary | ICD-10-CM | POA: Diagnosis not present

## 2021-03-12 DIAGNOSIS — I5032 Chronic diastolic (congestive) heart failure: Secondary | ICD-10-CM | POA: Diagnosis not present

## 2021-03-12 DIAGNOSIS — I4891 Unspecified atrial fibrillation: Secondary | ICD-10-CM | POA: Diagnosis not present

## 2021-03-12 DIAGNOSIS — F339 Major depressive disorder, recurrent, unspecified: Secondary | ICD-10-CM | POA: Diagnosis not present

## 2021-03-12 DIAGNOSIS — R8290 Unspecified abnormal findings in urine: Secondary | ICD-10-CM | POA: Diagnosis not present

## 2021-03-12 DIAGNOSIS — I639 Cerebral infarction, unspecified: Secondary | ICD-10-CM | POA: Diagnosis not present

## 2021-03-12 DIAGNOSIS — F329 Major depressive disorder, single episode, unspecified: Secondary | ICD-10-CM | POA: Diagnosis not present

## 2021-03-12 DIAGNOSIS — I10 Essential (primary) hypertension: Secondary | ICD-10-CM | POA: Diagnosis not present

## 2021-03-15 DIAGNOSIS — I10 Essential (primary) hypertension: Secondary | ICD-10-CM | POA: Diagnosis not present

## 2021-03-16 DIAGNOSIS — E1142 Type 2 diabetes mellitus with diabetic polyneuropathy: Secondary | ICD-10-CM | POA: Diagnosis not present

## 2021-03-16 DIAGNOSIS — R2681 Unsteadiness on feet: Secondary | ICD-10-CM | POA: Diagnosis not present

## 2021-03-18 DIAGNOSIS — R059 Cough, unspecified: Secondary | ICD-10-CM | POA: Diagnosis not present

## 2021-03-18 DIAGNOSIS — R1310 Dysphagia, unspecified: Secondary | ICD-10-CM | POA: Diagnosis not present

## 2021-03-18 DIAGNOSIS — K5900 Constipation, unspecified: Secondary | ICD-10-CM | POA: Diagnosis not present

## 2021-03-18 DIAGNOSIS — F32A Depression, unspecified: Secondary | ICD-10-CM | POA: Diagnosis not present

## 2021-03-18 DIAGNOSIS — N390 Urinary tract infection, site not specified: Secondary | ICD-10-CM | POA: Diagnosis not present

## 2021-03-18 DIAGNOSIS — D508 Other iron deficiency anemias: Secondary | ICD-10-CM | POA: Diagnosis not present

## 2021-03-18 DIAGNOSIS — R5381 Other malaise: Secondary | ICD-10-CM | POA: Diagnosis not present

## 2021-03-18 DIAGNOSIS — I739 Peripheral vascular disease, unspecified: Secondary | ICD-10-CM | POA: Diagnosis not present

## 2021-03-18 DIAGNOSIS — I951 Orthostatic hypotension: Secondary | ICD-10-CM | POA: Diagnosis not present

## 2021-03-18 DIAGNOSIS — G2581 Restless legs syndrome: Secondary | ICD-10-CM | POA: Diagnosis not present

## 2021-03-18 DIAGNOSIS — R197 Diarrhea, unspecified: Secondary | ICD-10-CM | POA: Diagnosis not present

## 2021-03-18 DIAGNOSIS — R296 Repeated falls: Secondary | ICD-10-CM | POA: Diagnosis not present

## 2021-04-02 DIAGNOSIS — F329 Major depressive disorder, single episode, unspecified: Secondary | ICD-10-CM | POA: Diagnosis not present

## 2021-04-02 DIAGNOSIS — G8929 Other chronic pain: Secondary | ICD-10-CM | POA: Diagnosis not present

## 2021-04-02 DIAGNOSIS — G4720 Circadian rhythm sleep disorder, unspecified type: Secondary | ICD-10-CM | POA: Diagnosis not present

## 2021-04-04 DIAGNOSIS — G8929 Other chronic pain: Secondary | ICD-10-CM | POA: Diagnosis not present

## 2021-04-04 DIAGNOSIS — N390 Urinary tract infection, site not specified: Secondary | ICD-10-CM | POA: Diagnosis not present

## 2021-04-04 DIAGNOSIS — R5381 Other malaise: Secondary | ICD-10-CM | POA: Diagnosis not present

## 2021-04-04 DIAGNOSIS — K219 Gastro-esophageal reflux disease without esophagitis: Secondary | ICD-10-CM | POA: Diagnosis not present

## 2021-04-04 DIAGNOSIS — I951 Orthostatic hypotension: Secondary | ICD-10-CM | POA: Diagnosis not present

## 2021-04-04 DIAGNOSIS — R059 Cough, unspecified: Secondary | ICD-10-CM | POA: Diagnosis not present

## 2021-04-04 DIAGNOSIS — E1142 Type 2 diabetes mellitus with diabetic polyneuropathy: Secondary | ICD-10-CM | POA: Diagnosis not present

## 2021-04-04 DIAGNOSIS — E119 Type 2 diabetes mellitus without complications: Secondary | ICD-10-CM | POA: Diagnosis not present

## 2021-04-04 DIAGNOSIS — R197 Diarrhea, unspecified: Secondary | ICD-10-CM | POA: Diagnosis not present

## 2021-04-04 DIAGNOSIS — I10 Essential (primary) hypertension: Secondary | ICD-10-CM | POA: Diagnosis not present

## 2021-04-04 DIAGNOSIS — I4891 Unspecified atrial fibrillation: Secondary | ICD-10-CM | POA: Diagnosis not present

## 2021-04-04 DIAGNOSIS — E559 Vitamin D deficiency, unspecified: Secondary | ICD-10-CM | POA: Diagnosis not present

## 2021-04-10 ENCOUNTER — Telehealth: Payer: PPO

## 2021-04-12 ENCOUNTER — Ambulatory Visit: Payer: PPO | Admitting: Licensed Clinical Social Worker

## 2021-04-12 DIAGNOSIS — I5032 Chronic diastolic (congestive) heart failure: Secondary | ICD-10-CM

## 2021-04-12 DIAGNOSIS — E785 Hyperlipidemia, unspecified: Secondary | ICD-10-CM

## 2021-04-12 DIAGNOSIS — J8410 Pulmonary fibrosis, unspecified: Secondary | ICD-10-CM

## 2021-04-12 DIAGNOSIS — E1169 Type 2 diabetes mellitus with other specified complication: Secondary | ICD-10-CM

## 2021-04-12 DIAGNOSIS — M159 Polyosteoarthritis, unspecified: Secondary | ICD-10-CM

## 2021-04-12 DIAGNOSIS — Z794 Long term (current) use of insulin: Secondary | ICD-10-CM

## 2021-04-12 DIAGNOSIS — I63312 Cerebral infarction due to thrombosis of left middle cerebral artery: Secondary | ICD-10-CM

## 2021-04-12 DIAGNOSIS — E1159 Type 2 diabetes mellitus with other circulatory complications: Secondary | ICD-10-CM

## 2021-04-12 DIAGNOSIS — E1140 Type 2 diabetes mellitus with diabetic neuropathy, unspecified: Secondary | ICD-10-CM

## 2021-04-12 DIAGNOSIS — I4891 Unspecified atrial fibrillation: Secondary | ICD-10-CM

## 2021-04-12 NOTE — Patient Instructions (Addendum)
Visit Information  Patient Goals:  Protect My Health. Manage Anxiety and Stress issues. Complete ADLs with assistance as needed.  Timeframe:  Short-Term Goal Priority:  Medium Progress: On Track Start Date:             10/30/20                Expected End Date:          04/17/21            Follow Up Date  [name] discharged client today from [facility] program at [facility] since client is now receiving care at [facility] in [place], [place] My Health: Manage anxiety and stress issues . Complete ADLs with assistance as needed.    Why is this important?   Screening tests can find diseases early when they are easier to treat.  Your doctor or nurse will talk with you about which tests are important for you.  Getting shots for common diseases like the flu and shingles will help prevent them.     Patient Self Care Activities:  Cooperates with care providers at SNF Has support from son, Adriena Manfre.  Patient Coping Strengths:  Supportive Relationships Family Friends  Patient Self Care Deficits:  Unable to perform IADLs independently Mobility challenges  Patient Goals:  - spend time or talk with others every day - practice relaxation or meditation daily - keep a calendar with appointment dates  Follow Up Plan: [name] discharged today client from [facility] program at [facility]  since client is receiving care at [facility] in [place], [place].Marcellius Montagna MSW, [name] Licensed Clinical Social Worker [facility] Care Management [phone]

## 2021-04-12 NOTE — Chronic Care Management (AMB) (Signed)
Chronic Care Management    Clinical Social Work Note  04/12/2021 Name: NITHYA MERIWEATHER MRN: [id number] DOB: 12/03/1925  [name] is a 87 y.o. year old female who is a primary care patient of Ivy Lynn, NP. The CCM team was consulted to assist the patient with chronic disease management and/or care coordination needs related to: [employer] .   Engaged with patient / son of patient, Ellyn Rubiano, by telephone for follow up visit in response to provider referral for social work chronic care management and care coordination services.   Consent to Services:  The patient was given information about Chronic Care Management services, agreed to services, and gave verbal consent prior to initiation of services.  Please see initial visit note for detailed documentation.   Patient agreed to services and consent obtained.   Assessment: Review of patient past medical history, allergies, medications, and health status, including review of relevant consultants reports was performed today as part of a comprehensive evaluation and provision of chronic care management and care coordination services.     SDOH (Social Determinants of Health) assessments and interventions performed:  SDOH Interventions    Flowsheet Row Most Recent Value  SDOH Interventions   Physical Activity Interventions Other (Comments)  [client has walking challenges. she uses a walker to help her walk, she is at risk for falls]  Stress Interventions Other (Comment)  [client has stress related to managing her medical needs. client is dependent for ADLs care]  Depression Interventions/Treatment  Medication        Advanced Directives Status: See Vynca application for related entries.  CCM Care Plan  Allergies  Allergen Reactions   Diltiazem Hcl Itching, Other (See Comments) and Rash    Red itchy rash started a few hours after taking short acting 132m dilt tabs Red itchy rash started a few hours after  taking short acting 1231mdilt tabs Red itchy rash started a few hours after taking short acting 12076milt tabs   Duloxetine Hcl Anxiety, Other (See Comments) and Rash    Makes patient feel "restless and scared" and unable to sleep Makes patient feel "restless and scared" and unable to sleep Makes patient feel "restless and scared" and unable to sleep   Ace Inhibitors Cough    Outpatient Encounter Medications as of 04/12/2021  Medication Sig   acetaminophen (TYLENOL) 500 MG tablet Take 1 tablet (500 mg total) by mouth every 6 (six) hours as needed for moderate pain.   atorvastatin (LIPITOR) 80 MG tablet Take 1 tablet (80 mg total) by mouth at bedtime.   blood glucose meter kit and supplies Dispense based on patient and insurance preference. Use up to four times daily as directed. (FOR ICD-10 E10.9, E11.9).accucheck   cephALEXin (KEFLEX) 500 MG capsule Take 1 capsule (500 mg total) by mouth 2 (two) times daily. (Patient not taking: Reported on 04/09/2020)   Cholecalciferol (VITAMIN D3) 50 MCG (2000 UT) TABS Take 2,000 Units by mouth in the morning.   cloNIDine (CATAPRES) 0.1 MG tablet Take 1 tablet (0.1 mg total) by mouth 2 (two) times daily as needed (for Systolic over 180814  DULoxetine (CYMBALTA) 20 MG capsule Take 1 capsule (20 mg total) by mouth daily.   ferrous sulfate 325 (65 FE) MG tablet Take 1 tablet (325 mg total) by mouth daily with breakfast. (Patient not taking: Reported on 04/09/2020)   glucose blood (ONE TOUCH ULTRA TEST) test strip USE TO check blood sugars twice daily   hydrALAZINE (APRESOLINE)  50 MG tablet Take 1 tablet (50 mg total) by mouth 3 (three) times daily.   Insulin Pen Needle (PEN NEEDLES) 32G X 4 MM MISC Patient test bid   Insulin Pen Needle 31G X 8 MM MISC Use once daily with lantus solostar   Lancets (FREESTYLE) lancets Use as instructed   LANTUS SOLOSTAR 100 UNIT/ML Solostar Pen INJECT 50 UNITS ONCE DAILY AT 10PM (Patient taking differently: Inject 10 Units into  the skin at bedtime. )   Magnesium 250 MG TABS Take 1 tablet (250 mg total) by mouth daily.   meloxicam (MOBIC) 7.5 MG tablet Take 1 tablet (7.5 mg total) by mouth daily. (Patient not taking: Reported on 04/09/2020)   metFORMIN (GLUCOPHAGE) 500 MG tablet Take 1 tablet (500 mg total) by mouth daily with breakfast.   NOVOLOG FLEXPEN 100 UNIT/ML FlexPen Inject 0-10 Units into the skin See admin instructions. Sliding scales 1 to 150=0 units, 151-200=2 units, 201-250=4 units, 251-300=6 units, 301-350=8 units, 351-400=10 units >400 call Dr < 70 call Dr   omeprazole (PRILOSEC) 20 MG capsule TAKE  (1)  CAPSULE  TWICE DAILY (TAKE ON AN EMPTY STOMACH AT LEAST 30 MIN- UTES BEFORE MEALS).   ondansetron (ZOFRAN) 4 MG tablet Take 1 tablet (4 mg total) by mouth every 8 (eight) hours as needed for nausea or vomiting.   oxymetazoline (AFRIN) 0.05 % nasal spray Place 1 spray into both nostrils 2 (two) times daily.   potassium chloride SA (KLOR-CON) 20 MEQ tablet Take 20 mEq by mouth daily.   rOPINIRole (REQUIP) 0.25 MG tablet Take 1 tablet (0.25 mg total) by mouth 3 (three) times daily. Take 0.25 mg by mouth two times a day and 0.25 mg at bedtime   SUPER B COMPLEX/C PO Take 1 tablet by mouth daily.   tiZANidine (ZANAFLEX) 4 MG capsule Take 1 capsule (4 mg total) by mouth 3 (three) times daily as needed for muscle spasms. (Patient not taking: Reported on 04/09/2020)   traMADol (ULTRAM) 50 MG tablet Take 50 mg by mouth 2 (two) times daily as needed.   traZODone (DESYREL) 150 MG tablet Take 0.5 tablets (75 mg total) by mouth at bedtime.   No facility-administered encounter medications on file as of 04/12/2021.    Patient Active Problem List   Diagnosis Date Noted   Encounter to establish care 02/20/2020   Bilateral subdural hematomas 07/16/2019   CHI (closed head injury), initial encounter 07/14/2019   Left radial head fracture 07/14/2019   SDH (subdural hematoma)    Hypertensive urgency    Subarachnoid hemorrhage  following injury 07/13/2019   Acute encephalopathy 06/27/2019   Heat stroke 06/27/2019   Hypokalemia 06/27/2019   Fever, unspecified 06/27/2019   Stroke ([place]) 06/27/2019   Altered mental status 06/26/2019   Right hip pain 06/22/2019   Chronic left hip pain 05/25/2019   Depression, recurrent ([place]) 05/25/2019   Chronic anticoagulation 04/19/2019   Leg cramps 03/04/2019   Short-term memory loss 03/04/2019   DOE (dyspnea on exertion) 03/04/2019   Aortic atherosclerosis ([place]) 03/04/2019   Physical deconditioning 01/24/2019   History of recurrent UTIs 12/03/2018   Chronic pain of right knee 10/08/2018   Type 2 diabetes mellitus with diabetic neuropathy, unspecified ([place]) 08/12/2018   Pulmonary vascular congestion    Chronic diastolic CHF (congestive heart failure) ([place])    Acute hypoxemic respiratory failure ([place]) 12/06/2017   Permanent atrial fibrillation ([place]) 12/06/2017   Trochanteric bursitis, right hip 07/16/2017   Other intervertebral disc degeneration, lumbar region 07/16/2017  Osteoporosis 11/04/2016   Diabetic polyneuropathy associated with type 2 diabetes mellitus ([place]) 11/14/2014   Fibromyalgia 02/11/2013   CVA (cerebral vascular accident) ([place]) 08/01/2008   Osteoarthritis 04/09/2007   Hyperlipidemia associated with type 2 diabetes mellitus ([place]) 03/08/2007   Hypertension associated with diabetes ([place]) 03/08/2007   Pulmonary fibrosis ([place]) 03/08/2007   GERD 03/08/2007   BREAST CANCER, HX OF 03/08/2007    Conditions to be addressed/monitored: monitor client management of anxiety and stress issues; monitor client completion of ADLs with assistance as needed  Care Plan : General Social Work (Adult)  Updates made by Katha Cabal, [name] since 04/12/2021 12:00 AM     Problem: Coping Skills (General Plan of Care)      Goal: Wants to complete ADLs daily and wants to manage emotions/anxiety over ongoing health concerns   Start Date: 10/30/2020  Expected End Date:  04/17/2021  This Visit's Progress: On track  Recent Progress: On track  Priority: Medium  Note:   Current Barriers:  Chronic Mental Health needs related to anxiety and stress management Mobility challenges ADLs completion challenges At risk for falls Suicidal Ideation/Homicidal Ideation: No  Clinical Social Work Goal(s):  patient will work with SW monthly by telephone or in person to reduce or manage symptoms related to anxiety and stress issues faced Patient will communicate with SW monthly by telephone or in person to discuss mobility issues of client and pain issues of client   Interventions: 1:1 collaboration with Ivy Lynn, NP regarding development and update of comprehensive plan of cafe as evidenced by provider attestation and co-signature Discussed client needs with [name], son of client Discussed client care received at [facility] in [place]. Reviewed fall history of client with [name].  Client has had numerous falls at SNF.   Reviewed mobility of client with [name].  [name] said client uses a walker to help her walk Reviewed pain issues of client. [name] said client speaks of occasional back pain or hip pain.  Discussed ADLs completion of client. [name] said client needs assistance with daily ADLs.  Reviewed client appetite. [name] said client has reduced appetite.  Reviewed sleeping issues of client. [name] said client is not sleeping very well at present.  Reviewed medical care of client received at SNF.  [name] said client is now receiving medical care at SNF   Reviewed mood status of client. [name] said client has sad mood occasionally.  Client does better with her mood when she has visits with [name], her son or when she has social interactions with friends who visit her at facility. Informed Shaida Route that [name] would discharge client today from CCM services since client is now residing at [facility] in [place], [place]. Marland Kitchen [name] agreed to this plan. [name]  thanked client and Everlie Eble for participating in CCM program support through [facility].  Patient Self Care Activities:  Cooperates with care providers at SNF Has support from her son, Marri Mcneff.   Patient Coping Strengths:  Supportive Relationships Family Friends  Patient Self Care Deficits:  Unable to perform ADLs independently Mobility challenges  Patient Goals:  - spend time or talk with others every day - practice relaxation or meditation daily - keep a calendar with appointment dates  Follow Up Plan: [name] discharged client today from CCM services since client is now receiving care at [facility] in [place], [place].Tashiya Souders MSW, [name] Licensed Clinical Social Worker [facility] Care Management [phone]

## 2021-04-15 DIAGNOSIS — E559 Vitamin D deficiency, unspecified: Secondary | ICD-10-CM | POA: Diagnosis not present

## 2021-04-15 DIAGNOSIS — K219 Gastro-esophageal reflux disease without esophagitis: Secondary | ICD-10-CM | POA: Diagnosis not present

## 2021-04-15 DIAGNOSIS — M1990 Unspecified osteoarthritis, unspecified site: Secondary | ICD-10-CM | POA: Diagnosis not present

## 2021-04-15 DIAGNOSIS — E119 Type 2 diabetes mellitus without complications: Secondary | ICD-10-CM | POA: Diagnosis not present

## 2021-04-15 DIAGNOSIS — R296 Repeated falls: Secondary | ICD-10-CM | POA: Diagnosis not present

## 2021-04-15 DIAGNOSIS — K5900 Constipation, unspecified: Secondary | ICD-10-CM | POA: Diagnosis not present

## 2021-04-15 DIAGNOSIS — R5381 Other malaise: Secondary | ICD-10-CM | POA: Diagnosis not present

## 2021-04-15 DIAGNOSIS — I4891 Unspecified atrial fibrillation: Secondary | ICD-10-CM | POA: Diagnosis not present

## 2021-04-15 DIAGNOSIS — I951 Orthostatic hypotension: Secondary | ICD-10-CM | POA: Diagnosis not present

## 2021-04-15 DIAGNOSIS — I10 Essential (primary) hypertension: Secondary | ICD-10-CM | POA: Diagnosis not present

## 2021-04-15 DIAGNOSIS — G8929 Other chronic pain: Secondary | ICD-10-CM | POA: Diagnosis not present

## 2021-05-11 DIAGNOSIS — E1142 Type 2 diabetes mellitus with diabetic polyneuropathy: Secondary | ICD-10-CM | POA: Diagnosis not present

## 2021-05-13 DIAGNOSIS — M1990 Unspecified osteoarthritis, unspecified site: Secondary | ICD-10-CM | POA: Diagnosis not present

## 2021-05-13 DIAGNOSIS — R300 Dysuria: Secondary | ICD-10-CM | POA: Diagnosis not present

## 2021-05-13 DIAGNOSIS — I951 Orthostatic hypotension: Secondary | ICD-10-CM | POA: Diagnosis not present

## 2021-05-13 DIAGNOSIS — E559 Vitamin D deficiency, unspecified: Secondary | ICD-10-CM | POA: Diagnosis not present

## 2021-05-13 DIAGNOSIS — K219 Gastro-esophageal reflux disease without esophagitis: Secondary | ICD-10-CM | POA: Diagnosis not present

## 2021-05-13 DIAGNOSIS — D508 Other iron deficiency anemias: Secondary | ICD-10-CM | POA: Diagnosis not present

## 2021-05-13 DIAGNOSIS — F32A Depression, unspecified: Secondary | ICD-10-CM | POA: Diagnosis not present

## 2021-05-13 DIAGNOSIS — F329 Major depressive disorder, single episode, unspecified: Secondary | ICD-10-CM | POA: Diagnosis not present

## 2021-05-13 DIAGNOSIS — K5900 Constipation, unspecified: Secondary | ICD-10-CM | POA: Diagnosis not present

## 2021-05-13 DIAGNOSIS — E119 Type 2 diabetes mellitus without complications: Secondary | ICD-10-CM | POA: Diagnosis not present

## 2021-05-13 DIAGNOSIS — R10819 Abdominal tenderness, unspecified site: Secondary | ICD-10-CM | POA: Diagnosis not present

## 2021-05-13 DIAGNOSIS — R319 Hematuria, unspecified: Secondary | ICD-10-CM | POA: Diagnosis not present

## 2021-05-14 DIAGNOSIS — N390 Urinary tract infection, site not specified: Secondary | ICD-10-CM | POA: Diagnosis not present

## 2021-05-16 DIAGNOSIS — E1159 Type 2 diabetes mellitus with other circulatory complications: Secondary | ICD-10-CM | POA: Diagnosis not present

## 2021-05-16 DIAGNOSIS — B351 Tinea unguium: Secondary | ICD-10-CM | POA: Diagnosis not present

## 2021-05-27 DIAGNOSIS — E119 Type 2 diabetes mellitus without complications: Secondary | ICD-10-CM | POA: Diagnosis not present

## 2021-05-27 DIAGNOSIS — M1990 Unspecified osteoarthritis, unspecified site: Secondary | ICD-10-CM | POA: Diagnosis not present

## 2021-05-27 DIAGNOSIS — I639 Cerebral infarction, unspecified: Secondary | ICD-10-CM | POA: Diagnosis not present

## 2021-05-27 DIAGNOSIS — E559 Vitamin D deficiency, unspecified: Secondary | ICD-10-CM | POA: Diagnosis not present

## 2021-05-27 DIAGNOSIS — I5032 Chronic diastolic (congestive) heart failure: Secondary | ICD-10-CM | POA: Diagnosis not present

## 2021-05-27 DIAGNOSIS — F339 Major depressive disorder, recurrent, unspecified: Secondary | ICD-10-CM | POA: Diagnosis not present

## 2021-05-27 DIAGNOSIS — E1142 Type 2 diabetes mellitus with diabetic polyneuropathy: Secondary | ICD-10-CM | POA: Diagnosis not present

## 2021-05-27 DIAGNOSIS — I10 Essential (primary) hypertension: Secondary | ICD-10-CM | POA: Diagnosis not present

## 2021-05-27 DIAGNOSIS — D508 Other iron deficiency anemias: Secondary | ICD-10-CM | POA: Diagnosis not present

## 2021-05-27 DIAGNOSIS — I4891 Unspecified atrial fibrillation: Secondary | ICD-10-CM | POA: Diagnosis not present

## 2021-05-27 DIAGNOSIS — F329 Major depressive disorder, single episode, unspecified: Secondary | ICD-10-CM | POA: Diagnosis not present

## 2021-05-30 DIAGNOSIS — I4891 Unspecified atrial fibrillation: Secondary | ICD-10-CM | POA: Diagnosis not present

## 2021-05-30 DIAGNOSIS — F32A Depression, unspecified: Secondary | ICD-10-CM | POA: Diagnosis not present

## 2021-05-30 DIAGNOSIS — G8929 Other chronic pain: Secondary | ICD-10-CM | POA: Diagnosis not present

## 2021-05-30 DIAGNOSIS — D692 Other nonthrombocytopenic purpura: Secondary | ICD-10-CM | POA: Diagnosis not present

## 2021-05-30 DIAGNOSIS — R10819 Abdominal tenderness, unspecified site: Secondary | ICD-10-CM | POA: Diagnosis not present

## 2021-05-30 DIAGNOSIS — G2581 Restless legs syndrome: Secondary | ICD-10-CM | POA: Diagnosis not present

## 2021-05-30 DIAGNOSIS — I110 Hypertensive heart disease with heart failure: Secondary | ICD-10-CM | POA: Diagnosis not present

## 2021-05-30 DIAGNOSIS — Z66 Do not resuscitate: Secondary | ICD-10-CM | POA: Diagnosis not present

## 2021-05-30 DIAGNOSIS — J84112 Idiopathic pulmonary fibrosis: Secondary | ICD-10-CM | POA: Diagnosis not present

## 2021-05-30 DIAGNOSIS — Z794 Long term (current) use of insulin: Secondary | ICD-10-CM | POA: Diagnosis not present

## 2021-05-30 DIAGNOSIS — R300 Dysuria: Secondary | ICD-10-CM | POA: Diagnosis not present

## 2021-05-30 DIAGNOSIS — M1990 Unspecified osteoarthritis, unspecified site: Secondary | ICD-10-CM | POA: Diagnosis not present

## 2021-05-30 DIAGNOSIS — K5900 Constipation, unspecified: Secondary | ICD-10-CM | POA: Diagnosis not present

## 2021-05-30 DIAGNOSIS — E1151 Type 2 diabetes mellitus with diabetic peripheral angiopathy without gangrene: Secondary | ICD-10-CM | POA: Diagnosis not present

## 2021-05-30 DIAGNOSIS — I5032 Chronic diastolic (congestive) heart failure: Secondary | ICD-10-CM | POA: Diagnosis not present

## 2021-05-30 DIAGNOSIS — D508 Other iron deficiency anemias: Secondary | ICD-10-CM | POA: Diagnosis not present

## 2021-05-30 DIAGNOSIS — I951 Orthostatic hypotension: Secondary | ICD-10-CM | POA: Diagnosis not present

## 2021-05-30 DIAGNOSIS — F330 Major depressive disorder, recurrent, mild: Secondary | ICD-10-CM | POA: Diagnosis not present

## 2021-05-30 DIAGNOSIS — Z6823 Body mass index (BMI) 23.0-23.9, adult: Secondary | ICD-10-CM | POA: Diagnosis not present

## 2021-05-30 DIAGNOSIS — G4700 Insomnia, unspecified: Secondary | ICD-10-CM | POA: Diagnosis not present

## 2021-06-04 DIAGNOSIS — N390 Urinary tract infection, site not specified: Secondary | ICD-10-CM | POA: Diagnosis not present

## 2021-06-04 DIAGNOSIS — R2681 Unsteadiness on feet: Secondary | ICD-10-CM | POA: Diagnosis not present

## 2021-06-04 DIAGNOSIS — M6281 Muscle weakness (generalized): Secondary | ICD-10-CM | POA: Diagnosis not present

## 2021-06-05 DIAGNOSIS — G4720 Circadian rhythm sleep disorder, unspecified type: Secondary | ICD-10-CM | POA: Diagnosis not present

## 2021-06-05 DIAGNOSIS — G8929 Other chronic pain: Secondary | ICD-10-CM | POA: Diagnosis not present

## 2021-06-05 DIAGNOSIS — F329 Major depressive disorder, single episode, unspecified: Secondary | ICD-10-CM | POA: Diagnosis not present

## 2021-06-08 DIAGNOSIS — R2681 Unsteadiness on feet: Secondary | ICD-10-CM | POA: Diagnosis not present

## 2021-06-08 DIAGNOSIS — N390 Urinary tract infection, site not specified: Secondary | ICD-10-CM | POA: Diagnosis not present

## 2021-06-08 DIAGNOSIS — M6281 Muscle weakness (generalized): Secondary | ICD-10-CM | POA: Diagnosis not present

## 2021-06-10 DIAGNOSIS — E559 Vitamin D deficiency, unspecified: Secondary | ICD-10-CM | POA: Diagnosis not present

## 2021-06-10 DIAGNOSIS — N390 Urinary tract infection, site not specified: Secondary | ICD-10-CM | POA: Diagnosis not present

## 2021-06-10 DIAGNOSIS — K219 Gastro-esophageal reflux disease without esophagitis: Secondary | ICD-10-CM | POA: Diagnosis not present

## 2021-06-10 DIAGNOSIS — I4891 Unspecified atrial fibrillation: Secondary | ICD-10-CM | POA: Diagnosis not present

## 2021-06-10 DIAGNOSIS — I739 Peripheral vascular disease, unspecified: Secondary | ICD-10-CM | POA: Diagnosis not present

## 2021-06-10 DIAGNOSIS — K429 Umbilical hernia without obstruction or gangrene: Secondary | ICD-10-CM | POA: Diagnosis not present

## 2021-06-10 DIAGNOSIS — R300 Dysuria: Secondary | ICD-10-CM | POA: Diagnosis not present

## 2021-06-10 DIAGNOSIS — R319 Hematuria, unspecified: Secondary | ICD-10-CM | POA: Diagnosis not present

## 2021-06-10 DIAGNOSIS — G2581 Restless legs syndrome: Secondary | ICD-10-CM | POA: Diagnosis not present

## 2021-06-10 DIAGNOSIS — G4700 Insomnia, unspecified: Secondary | ICD-10-CM | POA: Diagnosis not present

## 2021-06-10 DIAGNOSIS — M1990 Unspecified osteoarthritis, unspecified site: Secondary | ICD-10-CM | POA: Diagnosis not present

## 2021-06-11 DIAGNOSIS — R109 Unspecified abdominal pain: Secondary | ICD-10-CM | POA: Diagnosis not present

## 2021-06-11 DIAGNOSIS — N390 Urinary tract infection, site not specified: Secondary | ICD-10-CM | POA: Diagnosis not present

## 2021-06-11 DIAGNOSIS — R52 Pain, unspecified: Secondary | ICD-10-CM | POA: Diagnosis not present

## 2021-06-11 DIAGNOSIS — R300 Dysuria: Secondary | ICD-10-CM | POA: Diagnosis not present

## 2021-06-12 ENCOUNTER — Non-Acute Institutional Stay: Payer: PPO | Admitting: Family Medicine

## 2021-06-12 ENCOUNTER — Other Ambulatory Visit: Payer: Self-pay

## 2021-06-12 ENCOUNTER — Encounter: Payer: Self-pay | Admitting: Family Medicine

## 2021-06-12 VITALS — BP 136/60 | HR 54 | Temp 97.6°F | Resp 16

## 2021-06-12 DIAGNOSIS — I4821 Permanent atrial fibrillation: Secondary | ICD-10-CM | POA: Diagnosis not present

## 2021-06-12 DIAGNOSIS — R5381 Other malaise: Secondary | ICD-10-CM

## 2021-06-12 DIAGNOSIS — N3000 Acute cystitis without hematuria: Secondary | ICD-10-CM | POA: Diagnosis not present

## 2021-06-12 DIAGNOSIS — Z515 Encounter for palliative care: Secondary | ICD-10-CM

## 2021-06-12 DIAGNOSIS — R198 Other specified symptoms and signs involving the digestive system and abdomen: Secondary | ICD-10-CM | POA: Diagnosis not present

## 2021-06-12 NOTE — Progress Notes (Signed)
Therapist, nutritional Palliative Care Consult Note Telephone: [phone]  Fax: [phone]    Date of encounter: 06/12/21 10:30 AM  PATIENT NAME: [name] [facility] [address] [place] [address]   [phone] (home)  DOB: 1925-05-08 MRN: [id number] PRIMARY CARE PROVIDER:    Daryll Drown, NP,  [address] [place] [address] [phone]  REFERRING PROVIDER:   Daryll Drown, NP [address] [place],  [place] [address] [phone]  RESPONSIBLE PARTY:    Contact Information     Name Relation Home Work Mobile   [place] Son [phone]  [phone]        I met face to face with patient in [facility] facility. Palliative Care was asked to follow this patient by consultation request of  Daryll Drown, NP to address advance care planning and complex medical decision making. This is a follow up visit.                             ASSESSMENT, SYMPTOM MANAGEMENT AND PLAN / RECOMMENDATION  Palliative Care Encounter MOST as of 07/11/2019:  DNR, DNI, limited intervention, antibiotics and IV fluids if indicated, no feeding tube.  2.    Acute cystitis without hematuria Currently on antibiotics for UTI.  3.    Permanent atrial fibrillation Slow rate today, agree with no anticoagulation given age and fall risk.  4.    Physical deconditioning Poor safety awareness, unsteady gait. Encourage pt to call for help before getting OOB, changing positions slowly and encourage fluid intake for hydration.  5.    Difficulty swallowing large pills No trouble with normal pills, large pills get stuck.  Does not like applesauce or pudding, likes ice cream.  Crush large pills in ice cream    Advance Care Planning/Goals of Care: Goals include to maximize quality of life and symptom management. Patient gave her permission to discuss. Our advance care planning conversation included a discussion about:    The  value and importance of advance care planning-MOST form completed and on file Identification of a healthcare agent is son Mardelle Matte Review of an advance directive document-MOST. Decision not to resuscitate or to de-escalate disease focused treatments due to poor prognosis. CODE STATUS: MOST: DNR/DNI Limited intervention IV fluid and antibiotics if needed No feeding tube    Follow up Palliative Care Visit: Palliative care will continue to follow for complex medical decision making, advance care planning, and clarification of goals. Return 4-6 weeks or prn.    This visit was coded based on medical decision making (MDM).  PPS: 50%  HOSPICE ELIGIBILITY/DIAGNOSIS: TBD  Chief Complaint:  Civil engineer, contracting Palliative Care is following up with patient for chronic disease management, advance directive and defining/refining goals of care.  HISTORY OF PRESENT ILLNESS:  [name] is a 88 y.o. year old female with hypertension, permanent atrial fibrillation, hx of CVA, chronic diastolic CHF, pulmonary fibrosis with Oxygen dependence, hx of SAH and SDH.  Pt seen lying in bed, states "pain is constant and that she is voiding about q 15-30 minutes, "day and night".  She states feeling like CNAs don't care.  She has had 1 recent fall requiring her to be taken to ER to have staples in her head.    History obtained from review of EMR, discussion with primary team, and interview with facility staff and/or Ms. Stillman.  I reviewed available lab, medications,  imaging, studies and related documents from the EMR.  Records reviewed and summarized above.   ROS General: NAD, denies fever and chills EYES: denies vision changes ENMT: denies dysphagia, some difficulty swallowing "large" pills Cardiovascular: denies chest pain, denies DOE Pulmonary: denies cough, denies increased SOB Abdomen: endorses "lousy" appetite but thinks she has gained weight, denies constipation, endorses continence of  bowel GU: endorses dysuria and urinary frequency, endorses continence of urine MSK:  denies increased weakness, Reports "18" falls since being at facility, 1 in the last 3 weeks. Skin: denies rashes or wounds Neurological: denies pain, denies insomnia Psych: Endorses positive mood Heme/lymph/immuno: denies bruises, abnormal bleeding  Physical Exam: Current and past weights: 133 lbs 3.2 oz 02/2021 Constitutional: NAD General: frail appearing, thin EYES: anicteric sclera, lids intact, no discharge  ENMT: intact hearing, oral mucous membranes moist, dentition intact CV: S1S2, IRIR, trace pedal edema bilaterally Pulmonary: CTAB, no increased work of breathing, no cough, room air Abdomen: Normo-active BS + 4 quadrants, soft, noted periumbilical hernia-mildly tender and tender in LLQ GU: deferred MSK: no sarcopenia, moves all extremities, ambulatory with unsteady gait and rolling walker Skin: warm and dry, no rashes or wounds on visible skin Neuro:  no generalized weakness,  no cognitive impairment Psych: non-anxious affect, A and O x 3 Hem/lymph/immuno: no widespread bruising   Thank you for the opportunity to participate in the care of Ms. Meding.  The palliative care team will continue to follow. Please call our office at [phone] if we can be of additional assistance.   Marijo Conception, FNP -C  COVID-19 PATIENT SCREENING TOOL Asked and negative response unless otherwise noted:   Have you had symptoms of covid, tested positive or been in contact with someone with symptoms/positive test in the past 5-10 days?  Unknown

## 2021-06-24 DIAGNOSIS — E119 Type 2 diabetes mellitus without complications: Secondary | ICD-10-CM | POA: Diagnosis not present

## 2021-06-24 DIAGNOSIS — G8929 Other chronic pain: Secondary | ICD-10-CM | POA: Diagnosis not present

## 2021-06-24 DIAGNOSIS — R600 Localized edema: Secondary | ICD-10-CM | POA: Diagnosis not present

## 2021-06-24 DIAGNOSIS — K219 Gastro-esophageal reflux disease without esophagitis: Secondary | ICD-10-CM | POA: Diagnosis not present

## 2021-06-24 DIAGNOSIS — E559 Vitamin D deficiency, unspecified: Secondary | ICD-10-CM | POA: Diagnosis not present

## 2021-06-24 DIAGNOSIS — D508 Other iron deficiency anemias: Secondary | ICD-10-CM | POA: Diagnosis not present

## 2021-06-24 DIAGNOSIS — M1990 Unspecified osteoarthritis, unspecified site: Secondary | ICD-10-CM | POA: Diagnosis not present

## 2021-06-24 DIAGNOSIS — G4700 Insomnia, unspecified: Secondary | ICD-10-CM | POA: Diagnosis not present

## 2021-06-24 DIAGNOSIS — K5900 Constipation, unspecified: Secondary | ICD-10-CM | POA: Diagnosis not present

## 2021-06-24 DIAGNOSIS — N390 Urinary tract infection, site not specified: Secondary | ICD-10-CM | POA: Diagnosis not present

## 2021-06-24 DIAGNOSIS — I951 Orthostatic hypotension: Secondary | ICD-10-CM | POA: Diagnosis not present

## 2021-06-24 DIAGNOSIS — I739 Peripheral vascular disease, unspecified: Secondary | ICD-10-CM | POA: Diagnosis not present

## 2021-06-27 DIAGNOSIS — F329 Major depressive disorder, single episode, unspecified: Secondary | ICD-10-CM | POA: Diagnosis not present

## 2021-06-27 DIAGNOSIS — I639 Cerebral infarction, unspecified: Secondary | ICD-10-CM | POA: Diagnosis not present

## 2021-06-27 DIAGNOSIS — N390 Urinary tract infection, site not specified: Secondary | ICD-10-CM | POA: Diagnosis not present

## 2021-06-27 DIAGNOSIS — K5900 Constipation, unspecified: Secondary | ICD-10-CM | POA: Diagnosis not present

## 2021-06-27 DIAGNOSIS — I10 Essential (primary) hypertension: Secondary | ICD-10-CM | POA: Diagnosis not present

## 2021-06-27 DIAGNOSIS — E559 Vitamin D deficiency, unspecified: Secondary | ICD-10-CM | POA: Diagnosis not present

## 2021-06-27 DIAGNOSIS — M1990 Unspecified osteoarthritis, unspecified site: Secondary | ICD-10-CM | POA: Diagnosis not present

## 2021-06-27 DIAGNOSIS — I739 Peripheral vascular disease, unspecified: Secondary | ICD-10-CM | POA: Diagnosis not present

## 2021-06-27 DIAGNOSIS — I5032 Chronic diastolic (congestive) heart failure: Secondary | ICD-10-CM | POA: Diagnosis not present

## 2021-06-27 DIAGNOSIS — F339 Major depressive disorder, recurrent, unspecified: Secondary | ICD-10-CM | POA: Diagnosis not present

## 2021-06-27 DIAGNOSIS — R600 Localized edema: Secondary | ICD-10-CM | POA: Diagnosis not present

## 2021-06-27 DIAGNOSIS — D508 Other iron deficiency anemias: Secondary | ICD-10-CM | POA: Diagnosis not present

## 2021-06-27 DIAGNOSIS — K219 Gastro-esophageal reflux disease without esophagitis: Secondary | ICD-10-CM | POA: Diagnosis not present

## 2021-06-27 DIAGNOSIS — G4700 Insomnia, unspecified: Secondary | ICD-10-CM | POA: Diagnosis not present

## 2021-06-27 DIAGNOSIS — E119 Type 2 diabetes mellitus without complications: Secondary | ICD-10-CM | POA: Diagnosis not present

## 2021-06-27 DIAGNOSIS — E1142 Type 2 diabetes mellitus with diabetic polyneuropathy: Secondary | ICD-10-CM | POA: Diagnosis not present

## 2021-06-27 DIAGNOSIS — G2581 Restless legs syndrome: Secondary | ICD-10-CM | POA: Diagnosis not present

## 2021-06-27 DIAGNOSIS — I951 Orthostatic hypotension: Secondary | ICD-10-CM | POA: Diagnosis not present

## 2021-06-27 DIAGNOSIS — I4891 Unspecified atrial fibrillation: Secondary | ICD-10-CM | POA: Diagnosis not present

## 2021-06-28 DIAGNOSIS — N390 Urinary tract infection, site not specified: Secondary | ICD-10-CM | POA: Diagnosis not present

## 2021-07-02 DIAGNOSIS — R109 Unspecified abdominal pain: Secondary | ICD-10-CM | POA: Diagnosis not present

## 2021-07-02 DIAGNOSIS — N390 Urinary tract infection, site not specified: Secondary | ICD-10-CM | POA: Diagnosis not present

## 2021-07-03 DIAGNOSIS — N390 Urinary tract infection, site not specified: Secondary | ICD-10-CM | POA: Diagnosis not present

## 2021-07-03 DIAGNOSIS — M1990 Unspecified osteoarthritis, unspecified site: Secondary | ICD-10-CM | POA: Diagnosis not present

## 2021-07-03 DIAGNOSIS — E559 Vitamin D deficiency, unspecified: Secondary | ICD-10-CM | POA: Diagnosis not present

## 2021-07-03 DIAGNOSIS — K429 Umbilical hernia without obstruction or gangrene: Secondary | ICD-10-CM | POA: Diagnosis not present

## 2021-07-03 DIAGNOSIS — I951 Orthostatic hypotension: Secondary | ICD-10-CM | POA: Diagnosis not present

## 2021-07-03 DIAGNOSIS — N189 Chronic kidney disease, unspecified: Secondary | ICD-10-CM | POA: Diagnosis not present

## 2021-07-03 DIAGNOSIS — K219 Gastro-esophageal reflux disease without esophagitis: Secondary | ICD-10-CM | POA: Diagnosis not present

## 2021-07-03 DIAGNOSIS — I739 Peripheral vascular disease, unspecified: Secondary | ICD-10-CM | POA: Diagnosis not present

## 2021-07-03 DIAGNOSIS — R600 Localized edema: Secondary | ICD-10-CM | POA: Diagnosis not present

## 2021-07-03 DIAGNOSIS — I4891 Unspecified atrial fibrillation: Secondary | ICD-10-CM | POA: Diagnosis not present

## 2021-07-03 DIAGNOSIS — G4700 Insomnia, unspecified: Secondary | ICD-10-CM | POA: Diagnosis not present

## 2021-07-04 DIAGNOSIS — I10 Essential (primary) hypertension: Secondary | ICD-10-CM | POA: Diagnosis not present

## 2021-07-04 DIAGNOSIS — N390 Urinary tract infection, site not specified: Secondary | ICD-10-CM | POA: Diagnosis not present

## 2021-07-04 DIAGNOSIS — R2681 Unsteadiness on feet: Secondary | ICD-10-CM | POA: Diagnosis not present

## 2021-07-04 DIAGNOSIS — M6281 Muscle weakness (generalized): Secondary | ICD-10-CM | POA: Diagnosis not present

## 2021-07-08 DIAGNOSIS — I951 Orthostatic hypotension: Secondary | ICD-10-CM | POA: Diagnosis not present

## 2021-07-08 DIAGNOSIS — N189 Chronic kidney disease, unspecified: Secondary | ICD-10-CM | POA: Diagnosis not present

## 2021-07-08 DIAGNOSIS — G8929 Other chronic pain: Secondary | ICD-10-CM | POA: Diagnosis not present

## 2021-07-08 DIAGNOSIS — F329 Major depressive disorder, single episode, unspecified: Secondary | ICD-10-CM | POA: Diagnosis not present

## 2021-07-08 DIAGNOSIS — G4720 Circadian rhythm sleep disorder, unspecified type: Secondary | ICD-10-CM | POA: Diagnosis not present

## 2021-07-08 DIAGNOSIS — G4700 Insomnia, unspecified: Secondary | ICD-10-CM | POA: Diagnosis not present

## 2021-07-08 DIAGNOSIS — E119 Type 2 diabetes mellitus without complications: Secondary | ICD-10-CM | POA: Diagnosis not present

## 2021-07-08 DIAGNOSIS — E559 Vitamin D deficiency, unspecified: Secondary | ICD-10-CM | POA: Diagnosis not present

## 2021-07-08 DIAGNOSIS — N390 Urinary tract infection, site not specified: Secondary | ICD-10-CM | POA: Diagnosis not present

## 2021-07-08 DIAGNOSIS — K5900 Constipation, unspecified: Secondary | ICD-10-CM | POA: Diagnosis not present

## 2021-07-08 DIAGNOSIS — R600 Localized edema: Secondary | ICD-10-CM | POA: Diagnosis not present

## 2021-07-08 DIAGNOSIS — K219 Gastro-esophageal reflux disease without esophagitis: Secondary | ICD-10-CM | POA: Diagnosis not present

## 2021-07-08 DIAGNOSIS — R32 Unspecified urinary incontinence: Secondary | ICD-10-CM | POA: Diagnosis not present

## 2021-07-08 DIAGNOSIS — M1990 Unspecified osteoarthritis, unspecified site: Secondary | ICD-10-CM | POA: Diagnosis not present

## 2021-07-16 DIAGNOSIS — R3914 Feeling of incomplete bladder emptying: Secondary | ICD-10-CM | POA: Diagnosis not present

## 2021-07-16 DIAGNOSIS — N3020 Other chronic cystitis without hematuria: Secondary | ICD-10-CM | POA: Diagnosis not present

## 2021-07-16 DIAGNOSIS — N952 Postmenopausal atrophic vaginitis: Secondary | ICD-10-CM | POA: Diagnosis not present

## 2021-07-28 DIAGNOSIS — R296 Repeated falls: Secondary | ICD-10-CM | POA: Diagnosis not present

## 2021-07-29 DIAGNOSIS — G4700 Insomnia, unspecified: Secondary | ICD-10-CM | POA: Diagnosis not present

## 2021-07-29 DIAGNOSIS — E1142 Type 2 diabetes mellitus with diabetic polyneuropathy: Secondary | ICD-10-CM | POA: Diagnosis not present

## 2021-07-29 DIAGNOSIS — I639 Cerebral infarction, unspecified: Secondary | ICD-10-CM | POA: Diagnosis not present

## 2021-07-29 DIAGNOSIS — F339 Major depressive disorder, recurrent, unspecified: Secondary | ICD-10-CM | POA: Diagnosis not present

## 2021-07-29 DIAGNOSIS — E559 Vitamin D deficiency, unspecified: Secondary | ICD-10-CM | POA: Diagnosis not present

## 2021-07-29 DIAGNOSIS — D508 Other iron deficiency anemias: Secondary | ICD-10-CM | POA: Diagnosis not present

## 2021-07-29 DIAGNOSIS — I10 Essential (primary) hypertension: Secondary | ICD-10-CM | POA: Diagnosis not present

## 2021-07-29 DIAGNOSIS — M1990 Unspecified osteoarthritis, unspecified site: Secondary | ICD-10-CM | POA: Diagnosis not present

## 2021-07-29 DIAGNOSIS — R600 Localized edema: Secondary | ICD-10-CM | POA: Diagnosis not present

## 2021-07-29 DIAGNOSIS — I951 Orthostatic hypotension: Secondary | ICD-10-CM | POA: Diagnosis not present

## 2021-07-29 DIAGNOSIS — M25562 Pain in left knee: Secondary | ICD-10-CM | POA: Diagnosis not present

## 2021-07-29 DIAGNOSIS — I5032 Chronic diastolic (congestive) heart failure: Secondary | ICD-10-CM | POA: Diagnosis not present

## 2021-07-29 DIAGNOSIS — R300 Dysuria: Secondary | ICD-10-CM | POA: Diagnosis not present

## 2021-07-29 DIAGNOSIS — M25522 Pain in left elbow: Secondary | ICD-10-CM | POA: Diagnosis not present

## 2021-07-29 DIAGNOSIS — F329 Major depressive disorder, single episode, unspecified: Secondary | ICD-10-CM | POA: Diagnosis not present

## 2021-07-29 DIAGNOSIS — K429 Umbilical hernia without obstruction or gangrene: Secondary | ICD-10-CM | POA: Diagnosis not present

## 2021-07-29 DIAGNOSIS — I4891 Unspecified atrial fibrillation: Secondary | ICD-10-CM | POA: Diagnosis not present

## 2021-07-29 DIAGNOSIS — G2581 Restless legs syndrome: Secondary | ICD-10-CM | POA: Diagnosis not present

## 2021-07-29 DIAGNOSIS — N189 Chronic kidney disease, unspecified: Secondary | ICD-10-CM | POA: Diagnosis not present

## 2021-07-29 DIAGNOSIS — K5900 Constipation, unspecified: Secondary | ICD-10-CM | POA: Diagnosis not present

## 2021-07-29 DIAGNOSIS — E119 Type 2 diabetes mellitus without complications: Secondary | ICD-10-CM | POA: Diagnosis not present

## 2021-07-30 DIAGNOSIS — R300 Dysuria: Secondary | ICD-10-CM | POA: Diagnosis not present

## 2021-07-31 DIAGNOSIS — F329 Major depressive disorder, single episode, unspecified: Secondary | ICD-10-CM | POA: Diagnosis not present

## 2021-07-31 DIAGNOSIS — R600 Localized edema: Secondary | ICD-10-CM | POA: Diagnosis not present

## 2021-07-31 DIAGNOSIS — E559 Vitamin D deficiency, unspecified: Secondary | ICD-10-CM | POA: Diagnosis not present

## 2021-07-31 DIAGNOSIS — K429 Umbilical hernia without obstruction or gangrene: Secondary | ICD-10-CM | POA: Diagnosis not present

## 2021-07-31 DIAGNOSIS — N390 Urinary tract infection, site not specified: Secondary | ICD-10-CM | POA: Diagnosis not present

## 2021-07-31 DIAGNOSIS — R0602 Shortness of breath: Secondary | ICD-10-CM | POA: Diagnosis not present

## 2021-07-31 DIAGNOSIS — E119 Type 2 diabetes mellitus without complications: Secondary | ICD-10-CM | POA: Diagnosis not present

## 2021-07-31 DIAGNOSIS — N189 Chronic kidney disease, unspecified: Secondary | ICD-10-CM | POA: Diagnosis not present

## 2021-08-01 DIAGNOSIS — R600 Localized edema: Secondary | ICD-10-CM | POA: Diagnosis not present

## 2021-08-01 DIAGNOSIS — N189 Chronic kidney disease, unspecified: Secondary | ICD-10-CM | POA: Diagnosis not present

## 2021-08-01 DIAGNOSIS — R0602 Shortness of breath: Secondary | ICD-10-CM | POA: Diagnosis not present

## 2021-08-01 DIAGNOSIS — M25522 Pain in left elbow: Secondary | ICD-10-CM | POA: Diagnosis not present

## 2021-08-01 DIAGNOSIS — I951 Orthostatic hypotension: Secondary | ICD-10-CM | POA: Diagnosis not present

## 2021-08-01 DIAGNOSIS — M1990 Unspecified osteoarthritis, unspecified site: Secondary | ICD-10-CM | POA: Diagnosis not present

## 2021-08-01 DIAGNOSIS — K5900 Constipation, unspecified: Secondary | ICD-10-CM | POA: Diagnosis not present

## 2021-08-01 DIAGNOSIS — N390 Urinary tract infection, site not specified: Secondary | ICD-10-CM | POA: Diagnosis not present

## 2021-08-01 DIAGNOSIS — K219 Gastro-esophageal reflux disease without esophagitis: Secondary | ICD-10-CM | POA: Diagnosis not present

## 2021-08-01 DIAGNOSIS — E119 Type 2 diabetes mellitus without complications: Secondary | ICD-10-CM | POA: Diagnosis not present

## 2021-08-01 DIAGNOSIS — D508 Other iron deficiency anemias: Secondary | ICD-10-CM | POA: Diagnosis not present

## 2021-08-01 DIAGNOSIS — G4700 Insomnia, unspecified: Secondary | ICD-10-CM | POA: Diagnosis not present

## 2021-08-04 NOTE — Progress Notes (Signed)
? ? ?Office Visit  ?  ?Patient Name: [name] ?Date of Encounter: 08/04/2021 ? ?Primary Care Provider:  Ivy Lynn, NP ?Primary Cardiologist:  None ?Primary Electrophysiologist: None ?Chief Complaint  ?  ?Follow-up for atrial fibrillation ? ? Patient Profile: ?CVA ?Atrial fibrillation-no AC due to age and fall risk ?HLD ?Moderate carotid artery disease ?Pulmonary fibrosis ?Diastolic CHF ?Breast cancer ? ? Recent Studies: ?US carotid bilateral:plaque resulting in 50-69% diameter ?stenosis of the proximal right ICA, less than 50% diameter stenosis on the left. ?2D echo: 2019> EF 60-65%, moderate LVH, moderate increased PA pressure, mildly reduced RV function, moderate TV regurg, mild MV regurg ?2D echo: 2021> EF 60-65%, no RWMA, mild to moderate aortic valve sclerosis/calcification, no evidence of stenosis, LAD mild to moderate dilated ? ?History of Present Illness  ?  ?[name] is a 88 y.o. female with PMH of HTN, permanent AF, CVA, chronic diastolic CHF, pulmonary fibrosis with O2 dependency, subarachnoid hemorrhage and subdural hematoma.   Patient is currently residing at [facility] facility with recent palliative care consult for advance care planning. She was last seen by Joan Domino, NP on 9/21 for follow-up for HTN and AF.  She was seen in the ED on 02/2021 with complaint of a witnessed fall striking the back of the head. ? ? Sincelast being seen in our clinic  [name] reports doing well but feeling easily fatigued and tired lately.  She also reports some shortness of breath from time to time that occurs throughout the day.  This resolves shortly after taking a deep breath however it is bothersome.  She is currently not participating in any exercises and states that she does not ambulate well due to unsteady gait.  Today her blood pressures was soft in the low 90s over 60s.  Upon rechecking systolic increased to 101/75. She denies chest pain, palpitations, dyspnea, PND,  orthopnea, nausea, vomiting, dizziness, syncope, edema, weight gain, or early satiety. ? ? ?Past Medical History  ?  ?Past Medical History:  ?Diagnosis Date  ? Arthritis   ? Atrial fibrillation ([place])   ? Cancer [facility])   ? breast  ? Cataract   ? Diabetes mellitus without complication ([place])   ? Frequent UTI   ? Hyperlipidemia   ? Hypertension   ? Neck pain   ? Scoliosis   ? Stroke [facility])   ? ?Past Surgical History:  ?Procedure Laterality Date  ? APPENDECTOMY    ? BREAST LUMPECTOMY Left   ? CHOLECYSTECTOMY    ? JOINT REPLACEMENT    ? bilat knee   ? REPLACEMENT TOTAL KNEE BILATERAL Bilateral   ? TONSILLECTOMY    ? ?Allergies ? ?Allergies  ?Allergen Reactions  ? Diltiazem Hcl Itching, Other (See Comments) and Rash  ?  Red itchy rash started a few hours after taking short acting $RemoveBefo'120mg'XqlfPlgBrdC$  dilt tabs ?Red itchy rash started a few hours after taking short acting $RemoveBefo'120mg'ZcNWFYwqUCf$  dilt tabs ?Red itchy rash started a few hours after taking short acting $RemoveBefo'120mg'KRViHwUqONk$  dilt tabs  ? Duloxetine Hcl Anxiety, Other (See Comments) and Rash  ?  Makes patient feel "restless and scared" and unable to sleep ?Makes patient feel "restless and scared" and unable to sleep ?Makes patient feel "restless and scared" and unable to sleep  ? Ace Inhibitors Cough  ? ? ?Home Medications  ?  ?Current Outpatient Medications  ?Medication Sig Dispense Refill  ? acetaminophen (TYLENOL) 500 MG tablet Take 1 tablet (500 mg total) by mouth every  6 (six) hours as needed for moderate pain. 30 tablet 0  ? atorvastatin (LIPITOR) 80 MG tablet Take 1 tablet (80 mg total) by mouth at bedtime. 90 tablet 1  ? blood glucose meter kit and supplies Dispense based on patient and insurance preference. Use up to four times daily as directed. (FOR ICD-10 E10.9, E11.9).accucheck 1 each 0  ? cephALEXin (KEFLEX) 500 MG capsule Take 1 capsule (500 mg total) by mouth 2 (two) times daily. (Patient not taking: Reported on 04/09/2020) 14 capsule 0  ? Cholecalciferol (VITAMIN D3) 50 MCG (2000 UT) TABS Take  2,000 Units by mouth in the morning.    ? cloNIDine (CATAPRES) 0.1 MG tablet Take 1 tablet (0.1 mg total) by mouth 2 (two) times daily as needed (for Systolic over 573). 60 tablet 3  ? DULoxetine (CYMBALTA) 20 MG capsule Take 1 capsule (20 mg total) by mouth daily. 30 capsule 6  ? ferrous sulfate 325 (65 FE) MG tablet Take 1 tablet (325 mg total) by mouth daily with breakfast. (Patient not taking: Reported on 04/09/2020) 90 tablet 0  ? glucose blood (ONE TOUCH ULTRA TEST) test strip USE TO check blood sugars twice daily 100 each 12  ? hydrALAZINE (APRESOLINE) 50 MG tablet Take 1 tablet (50 mg total) by mouth 3 (three) times daily. 90 tablet 2  ? Insulin Pen Needle (PEN NEEDLES) 32G X 4 MM MISC Patient test bid 100 each 1  ? Insulin Pen Needle 31G X 8 MM MISC Use once daily with lantus solostar 100 each 6  ? Lancets (FREESTYLE) lancets Use as instructed 100 each 2  ? LANTUS SOLOSTAR 100 UNIT/ML Solostar Pen INJECT 50 UNITS ONCE DAILY AT 10PM (Patient taking differently: Inject 10 Units into the skin at bedtime. ) 15 mL 0  ? Magnesium 250 MG TABS Take 1 tablet (250 mg total) by mouth daily. 90 tablet 0  ? meloxicam (MOBIC) 7.5 MG tablet Take 1 tablet (7.5 mg total) by mouth daily. (Patient not taking: Reported on 04/09/2020) 15 tablet 0  ? metFORMIN (GLUCOPHAGE) 500 MG tablet Take 1 tablet (500 mg total) by mouth daily with breakfast. 90 tablet 0  ? NOVOLOG FLEXPEN 100 UNIT/ML FlexPen Inject 0-10 Units into the skin See admin instructions. Sliding scales 1 to 150=0 units, 151-200=2 units, 201-250=4 units, 251-300=6 units, 301-350=8 units, 351-400=10 units >400 call Dr < 70 call Dr    ? omeprazole (PRILOSEC) 20 MG capsule TAKE  (1)  CAPSULE  TWICE DAILY (TAKE ON AN EMPTY STOMACH AT LEAST 30 MIN- UTES BEFORE MEALS). 180 capsule 0  ? ondansetron (ZOFRAN) 4 MG tablet Take 1 tablet (4 mg total) by mouth every 8 (eight) hours as needed for nausea or vomiting. 90 tablet 0  ? oxymetazoline (AFRIN) 0.05 % nasal spray Place 1  spray into both nostrils 2 (two) times daily. 30 mL 0  ? potassium chloride SA (KLOR-CON) 20 MEQ tablet Take 20 mEq by mouth daily.    ? rOPINIRole (REQUIP) 0.25 MG tablet Take 1 tablet (0.25 mg total) by mouth 3 (three) times daily. Take 0.25 mg by mouth two times a day and 0.25 mg at bedtime 90 tablet 2  ? SUPER B COMPLEX/C PO Take 1 tablet by mouth daily.    ? tiZANidine (ZANAFLEX) 4 MG capsule Take 1 capsule (4 mg total) by mouth 3 (three) times daily as needed for muscle spasms. (Patient not taking: Reported on 04/09/2020) 30 capsule 0  ? traMADol (ULTRAM) 50 MG tablet Take 50  mg by mouth 2 (two) times daily as needed.    ? traZODone (DESYREL) 150 MG tablet Take 0.5 tablets (75 mg total) by mouth at bedtime. 90 tablet 0  ? ?No current facility-administered medications for this visit.  ?  ? ?Review of Systems  ?Please see the history of present illness.    ?(+) Fatigue ?(+) Occasional shortness of breath ?All other systems reviewed and are otherwise negative except as noted above. ? ?Physical Exam  ?  ?Wt Readings from Last 3 Encounters:  ?02/25/21 133 lb 3.2 oz (60.4 kg)  ?04/10/20 138 lb 14.2 oz (63 kg)  ?01/06/20 139 lb (63 kg)  ? ?JG:YLUDA were no vitals filed for this visit.,There is no height or weight on file to calculate BMI. ? ?Constitutional:   ?   Appearance: Healthy appearance. Not in distress.  ?Neck:  ?   Vascular: JVD normal.  ?Pulmonary:  ?   Effort: Pulmonary effort is normal.  ?   Breath sounds: No wheezing. No rales.  ?Cardiovascular:  ?   Normal rate. Regular rhythm. Normal S1. Normal S2.   ?   Murmurs: There is no murmur.  ?Edema: ?   Peripheral edema absent.  ?Abdominal:  ?   Palpations: Abdomen is soft. There is no hepatomegaly.  ?Skin: ?   General: Skin is warm and dry.  ?Neurological:  ?   General: No focal deficit present.  ?   Mental Status: Alert and oriented to person, place and time.  ?   Cranial Nerves: Cranial nerves are intact.  ?EKG/LABS/Other Studies Reviewed  ?  ?ECG personally  reviewed by me today -sinus bradycardia with junctional rhythm with rate of 50- no acute changes. ? ?Risk Assessment/Calculations:   ? ?CHA2DS2-VASc Score = 9  ? This indicates a 12.2% annual risk of str

## 2021-08-05 ENCOUNTER — Encounter (HOSPITAL_BASED_OUTPATIENT_CLINIC_OR_DEPARTMENT_OTHER): Payer: Self-pay | Admitting: Nurse Practitioner

## 2021-08-05 ENCOUNTER — Ambulatory Visit (INDEPENDENT_AMBULATORY_CARE_PROVIDER_SITE_OTHER): Payer: PPO | Admitting: Nurse Practitioner

## 2021-08-05 VITALS — BP 94/60 | HR 50 | Ht 60.0 in | Wt 141.0 lb

## 2021-08-05 DIAGNOSIS — R5381 Other malaise: Secondary | ICD-10-CM

## 2021-08-05 DIAGNOSIS — G4700 Insomnia, unspecified: Secondary | ICD-10-CM | POA: Diagnosis not present

## 2021-08-05 DIAGNOSIS — N189 Chronic kidney disease, unspecified: Secondary | ICD-10-CM | POA: Diagnosis not present

## 2021-08-05 DIAGNOSIS — I10 Essential (primary) hypertension: Secondary | ICD-10-CM

## 2021-08-05 DIAGNOSIS — E119 Type 2 diabetes mellitus without complications: Secondary | ICD-10-CM | POA: Diagnosis not present

## 2021-08-05 DIAGNOSIS — I4821 Permanent atrial fibrillation: Secondary | ICD-10-CM

## 2021-08-05 DIAGNOSIS — E559 Vitamin D deficiency, unspecified: Secondary | ICD-10-CM | POA: Diagnosis not present

## 2021-08-05 DIAGNOSIS — E1169 Type 2 diabetes mellitus with other specified complication: Secondary | ICD-10-CM | POA: Diagnosis not present

## 2021-08-05 DIAGNOSIS — E785 Hyperlipidemia, unspecified: Secondary | ICD-10-CM | POA: Diagnosis not present

## 2021-08-05 DIAGNOSIS — D508 Other iron deficiency anemias: Secondary | ICD-10-CM | POA: Diagnosis not present

## 2021-08-05 DIAGNOSIS — I5032 Chronic diastolic (congestive) heart failure: Secondary | ICD-10-CM | POA: Diagnosis not present

## 2021-08-05 DIAGNOSIS — F329 Major depressive disorder, single episode, unspecified: Secondary | ICD-10-CM | POA: Diagnosis not present

## 2021-08-05 DIAGNOSIS — K429 Umbilical hernia without obstruction or gangrene: Secondary | ICD-10-CM | POA: Diagnosis not present

## 2021-08-05 DIAGNOSIS — R531 Weakness: Secondary | ICD-10-CM | POA: Diagnosis not present

## 2021-08-05 DIAGNOSIS — R600 Localized edema: Secondary | ICD-10-CM | POA: Diagnosis not present

## 2021-08-05 DIAGNOSIS — K219 Gastro-esophageal reflux disease without esophagitis: Secondary | ICD-10-CM | POA: Diagnosis not present

## 2021-08-05 DIAGNOSIS — N390 Urinary tract infection, site not specified: Secondary | ICD-10-CM | POA: Diagnosis not present

## 2021-08-05 MED ORDER — HYDRALAZINE HCL 25 MG PO TABS: 25.0000 mg | ORAL_TABLET | Freq: Three times a day (TID) | ORAL | 3 refills | Status: AC

## 2021-08-05 NOTE — Patient Instructions (Signed)
Medication Instructions:  ?Your physician has recommended you make the following change in your medication:  ? ?Change: Reduce Hydralazine '25mg'$  three times daily  ? ?*If you need a refill on your cardiac medications before your next appointment, please call your pharmacy* ? ?Follow-Up: ?At [facility], you and your health needs are our priority.  As part of our continuing mission to provide you with exceptional heart care, we have created designated Provider Care Teams.  These Care Teams include your primary Cardiologist (physician) and Advanced Practice Providers (APPs -  Physician Assistants and Nurse Practitioners) who all work together to provide you with the care you need, when you need it. ? ?We recommend signing up for the patient portal called "MyChart".  Sign up information is provided on this After Visit Summary.  MyChart is used to connect with patients for Virtual Visits (Telemedicine).  Patients are able to view lab/test results, encounter notes, upcoming appointments, etc.  Non-urgent messages can be sent to your provider as well.   ?To learn more about what you can do with MyChart, go to [email or web address].   ? ?Your next appointment:   ?1 year(s) ? ?The format for your next appointment:   ?In Person ? ?Provider:   ?Peter Martinique , MD ? ?Other Instructions ?Please continue to monitor your blood pressure and report any consistent readings of systolic (top) numbers greater than 140.  ? ? ? ?

## 2021-08-06 DIAGNOSIS — E1142 Type 2 diabetes mellitus with diabetic polyneuropathy: Secondary | ICD-10-CM | POA: Diagnosis not present

## 2021-08-06 DIAGNOSIS — I5032 Chronic diastolic (congestive) heart failure: Secondary | ICD-10-CM | POA: Diagnosis not present

## 2021-08-06 DIAGNOSIS — M25552 Pain in left hip: Secondary | ICD-10-CM | POA: Diagnosis not present

## 2021-08-09 DIAGNOSIS — M6281 Muscle weakness (generalized): Secondary | ICD-10-CM | POA: Diagnosis not present

## 2021-08-09 DIAGNOSIS — E1142 Type 2 diabetes mellitus with diabetic polyneuropathy: Secondary | ICD-10-CM | POA: Diagnosis not present

## 2021-08-09 DIAGNOSIS — R2681 Unsteadiness on feet: Secondary | ICD-10-CM | POA: Diagnosis not present

## 2021-08-10 DIAGNOSIS — K219 Gastro-esophageal reflux disease without esophagitis: Secondary | ICD-10-CM | POA: Diagnosis not present

## 2021-08-10 DIAGNOSIS — I951 Orthostatic hypotension: Secondary | ICD-10-CM | POA: Diagnosis not present

## 2021-08-10 DIAGNOSIS — I110 Hypertensive heart disease with heart failure: Secondary | ICD-10-CM | POA: Diagnosis not present

## 2021-08-10 DIAGNOSIS — J168 Pneumonia due to other specified infectious organisms: Secondary | ICD-10-CM | POA: Diagnosis not present

## 2021-08-10 DIAGNOSIS — Z7901 Long term (current) use of anticoagulants: Secondary | ICD-10-CM | POA: Diagnosis not present

## 2021-08-10 DIAGNOSIS — J470 Bronchiectasis with acute lower respiratory infection: Secondary | ICD-10-CM | POA: Diagnosis not present

## 2021-08-10 DIAGNOSIS — I10 Essential (primary) hypertension: Secondary | ICD-10-CM | POA: Diagnosis not present

## 2021-08-10 DIAGNOSIS — R0789 Other chest pain: Secondary | ICD-10-CM | POA: Diagnosis not present

## 2021-08-10 DIAGNOSIS — S0990XA Unspecified injury of head, initial encounter: Secondary | ICD-10-CM | POA: Diagnosis not present

## 2021-08-10 DIAGNOSIS — G8911 Acute pain due to trauma: Secondary | ICD-10-CM | POA: Diagnosis not present

## 2021-08-10 DIAGNOSIS — Z9181 History of falling: Secondary | ICD-10-CM | POA: Diagnosis not present

## 2021-08-10 DIAGNOSIS — R296 Repeated falls: Secondary | ICD-10-CM | POA: Diagnosis not present

## 2021-08-10 DIAGNOSIS — R2681 Unsteadiness on feet: Secondary | ICD-10-CM | POA: Diagnosis not present

## 2021-08-10 DIAGNOSIS — I4821 Permanent atrial fibrillation: Secondary | ICD-10-CM | POA: Diagnosis not present

## 2021-08-10 DIAGNOSIS — Z888 Allergy status to other drugs, medicaments and biological substances status: Secondary | ICD-10-CM | POA: Diagnosis not present

## 2021-08-10 DIAGNOSIS — R0902 Hypoxemia: Secondary | ICD-10-CM | POA: Diagnosis not present

## 2021-08-10 DIAGNOSIS — I482 Chronic atrial fibrillation, unspecified: Secondary | ICD-10-CM | POA: Diagnosis not present

## 2021-08-10 DIAGNOSIS — E1169 Type 2 diabetes mellitus with other specified complication: Secondary | ICD-10-CM | POA: Diagnosis not present

## 2021-08-10 DIAGNOSIS — E1165 Type 2 diabetes mellitus with hyperglycemia: Secondary | ICD-10-CM | POA: Diagnosis not present

## 2021-08-10 DIAGNOSIS — W19XXXA Unspecified fall, initial encounter: Secondary | ICD-10-CM | POA: Diagnosis not present

## 2021-08-10 DIAGNOSIS — Z8673 Personal history of transient ischemic attack (TIA), and cerebral infarction without residual deficits: Secondary | ICD-10-CM | POA: Diagnosis not present

## 2021-08-10 DIAGNOSIS — J479 Bronchiectasis, uncomplicated: Secondary | ICD-10-CM | POA: Diagnosis not present

## 2021-08-10 DIAGNOSIS — I517 Cardiomegaly: Secondary | ICD-10-CM | POA: Diagnosis not present

## 2021-08-10 DIAGNOSIS — I491 Atrial premature depolarization: Secondary | ICD-10-CM | POA: Diagnosis not present

## 2021-08-10 DIAGNOSIS — Z66 Do not resuscitate: Secondary | ICD-10-CM | POA: Diagnosis not present

## 2021-08-10 DIAGNOSIS — J8410 Pulmonary fibrosis, unspecified: Secondary | ICD-10-CM | POA: Diagnosis not present

## 2021-08-10 DIAGNOSIS — E876 Hypokalemia: Secondary | ICD-10-CM | POA: Diagnosis not present

## 2021-08-10 DIAGNOSIS — R457 State of emotional shock and stress, unspecified: Secondary | ICD-10-CM | POA: Diagnosis not present

## 2021-08-10 DIAGNOSIS — S8992XA Unspecified injury of left lower leg, initial encounter: Secondary | ICD-10-CM | POA: Diagnosis not present

## 2021-08-10 DIAGNOSIS — Z7984 Long term (current) use of oral hypoglycemic drugs: Secondary | ICD-10-CM | POA: Diagnosis not present

## 2021-08-10 DIAGNOSIS — I5032 Chronic diastolic (congestive) heart failure: Secondary | ICD-10-CM | POA: Diagnosis not present

## 2021-08-10 DIAGNOSIS — R41841 Cognitive communication deficit: Secondary | ICD-10-CM | POA: Diagnosis not present

## 2021-08-10 DIAGNOSIS — M25552 Pain in left hip: Secondary | ICD-10-CM | POA: Diagnosis not present

## 2021-08-10 DIAGNOSIS — R2689 Other abnormalities of gait and mobility: Secondary | ICD-10-CM | POA: Diagnosis not present

## 2021-08-10 DIAGNOSIS — M25462 Effusion, left knee: Secondary | ICD-10-CM | POA: Diagnosis not present

## 2021-08-10 DIAGNOSIS — E785 Hyperlipidemia, unspecified: Secondary | ICD-10-CM | POA: Diagnosis not present

## 2021-08-10 DIAGNOSIS — M25562 Pain in left knee: Secondary | ICD-10-CM | POA: Diagnosis not present

## 2021-08-10 DIAGNOSIS — R079 Chest pain, unspecified: Secondary | ICD-10-CM | POA: Diagnosis not present

## 2021-08-10 DIAGNOSIS — Z7982 Long term (current) use of aspirin: Secondary | ICD-10-CM | POA: Diagnosis not present

## 2021-08-10 DIAGNOSIS — Z794 Long term (current) use of insulin: Secondary | ICD-10-CM | POA: Diagnosis not present

## 2021-08-10 DIAGNOSIS — M6281 Muscle weakness (generalized): Secondary | ICD-10-CM | POA: Diagnosis not present

## 2021-08-10 DIAGNOSIS — M1990 Unspecified osteoarthritis, unspecified site: Secondary | ICD-10-CM | POA: Diagnosis not present

## 2021-08-10 DIAGNOSIS — J189 Pneumonia, unspecified organism: Secondary | ICD-10-CM | POA: Diagnosis not present

## 2021-08-10 DIAGNOSIS — R918 Other nonspecific abnormal finding of lung field: Secondary | ICD-10-CM | POA: Diagnosis not present

## 2021-08-10 DIAGNOSIS — F339 Major depressive disorder, recurrent, unspecified: Secondary | ICD-10-CM | POA: Diagnosis not present

## 2021-08-10 DIAGNOSIS — J90 Pleural effusion, not elsewhere classified: Secondary | ICD-10-CM | POA: Diagnosis not present

## 2021-08-10 DIAGNOSIS — R1312 Dysphagia, oropharyngeal phase: Secondary | ICD-10-CM | POA: Diagnosis not present

## 2021-08-10 DIAGNOSIS — S92402A Displaced unspecified fracture of left great toe, initial encounter for closed fracture: Secondary | ICD-10-CM | POA: Diagnosis not present

## 2021-08-10 DIAGNOSIS — M25551 Pain in right hip: Secondary | ICD-10-CM | POA: Diagnosis not present

## 2021-08-10 DIAGNOSIS — E1142 Type 2 diabetes mellitus with diabetic polyneuropathy: Secondary | ICD-10-CM | POA: Diagnosis not present

## 2021-08-10 DIAGNOSIS — E559 Vitamin D deficiency, unspecified: Secondary | ICD-10-CM | POA: Diagnosis not present

## 2021-08-10 DIAGNOSIS — I4891 Unspecified atrial fibrillation: Secondary | ICD-10-CM | POA: Diagnosis not present

## 2021-08-10 DIAGNOSIS — Z743 Need for continuous supervision: Secondary | ICD-10-CM | POA: Diagnosis not present

## 2021-08-14 DIAGNOSIS — J479 Bronchiectasis, uncomplicated: Secondary | ICD-10-CM | POA: Diagnosis not present

## 2021-08-14 DIAGNOSIS — S92402A Displaced unspecified fracture of left great toe, initial encounter for closed fracture: Secondary | ICD-10-CM | POA: Diagnosis not present

## 2021-08-14 DIAGNOSIS — N189 Chronic kidney disease, unspecified: Secondary | ICD-10-CM | POA: Diagnosis not present

## 2021-08-14 DIAGNOSIS — J8410 Pulmonary fibrosis, unspecified: Secondary | ICD-10-CM | POA: Diagnosis not present

## 2021-08-14 DIAGNOSIS — I482 Chronic atrial fibrillation, unspecified: Secondary | ICD-10-CM | POA: Diagnosis not present

## 2021-08-14 DIAGNOSIS — E559 Vitamin D deficiency, unspecified: Secondary | ICD-10-CM | POA: Diagnosis not present

## 2021-08-14 DIAGNOSIS — K429 Umbilical hernia without obstruction or gangrene: Secondary | ICD-10-CM | POA: Diagnosis not present

## 2021-08-14 DIAGNOSIS — E1142 Type 2 diabetes mellitus with diabetic polyneuropathy: Secondary | ICD-10-CM | POA: Diagnosis not present

## 2021-08-14 DIAGNOSIS — E1169 Type 2 diabetes mellitus with other specified complication: Secondary | ICD-10-CM | POA: Diagnosis not present

## 2021-08-14 DIAGNOSIS — M545 Low back pain, unspecified: Secondary | ICD-10-CM | POA: Diagnosis not present

## 2021-08-14 DIAGNOSIS — E1159 Type 2 diabetes mellitus with other circulatory complications: Secondary | ICD-10-CM | POA: Diagnosis not present

## 2021-08-14 DIAGNOSIS — R600 Localized edema: Secondary | ICD-10-CM | POA: Diagnosis not present

## 2021-08-14 DIAGNOSIS — R2689 Other abnormalities of gait and mobility: Secondary | ICD-10-CM | POA: Diagnosis not present

## 2021-08-14 DIAGNOSIS — Z9181 History of falling: Secondary | ICD-10-CM | POA: Diagnosis not present

## 2021-08-14 DIAGNOSIS — I951 Orthostatic hypotension: Secondary | ICD-10-CM | POA: Diagnosis not present

## 2021-08-14 DIAGNOSIS — Z7982 Long term (current) use of aspirin: Secondary | ICD-10-CM | POA: Diagnosis not present

## 2021-08-14 DIAGNOSIS — R2681 Unsteadiness on feet: Secondary | ICD-10-CM | POA: Diagnosis not present

## 2021-08-14 DIAGNOSIS — N390 Urinary tract infection, site not specified: Secondary | ICD-10-CM | POA: Diagnosis not present

## 2021-08-14 DIAGNOSIS — Z7901 Long term (current) use of anticoagulants: Secondary | ICD-10-CM | POA: Diagnosis not present

## 2021-08-14 DIAGNOSIS — I10 Essential (primary) hypertension: Secondary | ICD-10-CM | POA: Diagnosis not present

## 2021-08-14 DIAGNOSIS — I639 Cerebral infarction, unspecified: Secondary | ICD-10-CM | POA: Diagnosis not present

## 2021-08-14 DIAGNOSIS — D508 Other iron deficiency anemias: Secondary | ICD-10-CM | POA: Diagnosis not present

## 2021-08-14 DIAGNOSIS — F339 Major depressive disorder, recurrent, unspecified: Secondary | ICD-10-CM | POA: Diagnosis not present

## 2021-08-14 DIAGNOSIS — S72402D Unspecified fracture of lower end of left femur, subsequent encounter for closed fracture with routine healing: Secondary | ICD-10-CM | POA: Diagnosis not present

## 2021-08-14 DIAGNOSIS — M1990 Unspecified osteoarthritis, unspecified site: Secondary | ICD-10-CM | POA: Diagnosis not present

## 2021-08-14 DIAGNOSIS — I5032 Chronic diastolic (congestive) heart failure: Secondary | ICD-10-CM | POA: Diagnosis not present

## 2021-08-14 DIAGNOSIS — M6281 Muscle weakness (generalized): Secondary | ICD-10-CM | POA: Diagnosis not present

## 2021-08-14 DIAGNOSIS — F419 Anxiety disorder, unspecified: Secondary | ICD-10-CM | POA: Diagnosis not present

## 2021-08-14 DIAGNOSIS — Z743 Need for continuous supervision: Secondary | ICD-10-CM | POA: Diagnosis not present

## 2021-08-14 DIAGNOSIS — R52 Pain, unspecified: Secondary | ICD-10-CM | POA: Diagnosis not present

## 2021-08-14 DIAGNOSIS — I4891 Unspecified atrial fibrillation: Secondary | ICD-10-CM | POA: Diagnosis not present

## 2021-08-14 DIAGNOSIS — B351 Tinea unguium: Secondary | ICD-10-CM | POA: Diagnosis not present

## 2021-08-14 DIAGNOSIS — F329 Major depressive disorder, single episode, unspecified: Secondary | ICD-10-CM | POA: Diagnosis not present

## 2021-08-14 DIAGNOSIS — E876 Hypokalemia: Secondary | ICD-10-CM | POA: Diagnosis not present

## 2021-08-14 DIAGNOSIS — M25462 Effusion, left knee: Secondary | ICD-10-CM | POA: Diagnosis not present

## 2021-08-14 DIAGNOSIS — R531 Weakness: Secondary | ICD-10-CM | POA: Diagnosis not present

## 2021-08-14 DIAGNOSIS — R457 State of emotional shock and stress, unspecified: Secondary | ICD-10-CM | POA: Diagnosis not present

## 2021-08-14 DIAGNOSIS — E785 Hyperlipidemia, unspecified: Secondary | ICD-10-CM | POA: Diagnosis not present

## 2021-08-14 DIAGNOSIS — Z8673 Personal history of transient ischemic attack (TIA), and cerebral infarction without residual deficits: Secondary | ICD-10-CM | POA: Diagnosis not present

## 2021-08-14 DIAGNOSIS — Z794 Long term (current) use of insulin: Secondary | ICD-10-CM | POA: Diagnosis not present

## 2021-08-14 DIAGNOSIS — E1165 Type 2 diabetes mellitus with hyperglycemia: Secondary | ICD-10-CM | POA: Diagnosis not present

## 2021-08-14 DIAGNOSIS — E119 Type 2 diabetes mellitus without complications: Secondary | ICD-10-CM | POA: Diagnosis not present

## 2021-08-14 DIAGNOSIS — M25552 Pain in left hip: Secondary | ICD-10-CM | POA: Diagnosis not present

## 2021-08-14 DIAGNOSIS — K219 Gastro-esophageal reflux disease without esophagitis: Secondary | ICD-10-CM | POA: Diagnosis not present

## 2021-08-14 DIAGNOSIS — M25551 Pain in right hip: Secondary | ICD-10-CM | POA: Diagnosis not present

## 2021-08-14 DIAGNOSIS — M9712XD Periprosthetic fracture around internal prosthetic left knee joint, subsequent encounter: Secondary | ICD-10-CM | POA: Diagnosis not present

## 2021-08-14 DIAGNOSIS — M25562 Pain in left knee: Secondary | ICD-10-CM | POA: Diagnosis not present

## 2021-08-14 DIAGNOSIS — W19XXXA Unspecified fall, initial encounter: Secondary | ICD-10-CM | POA: Diagnosis not present

## 2021-08-14 DIAGNOSIS — R1312 Dysphagia, oropharyngeal phase: Secondary | ICD-10-CM | POA: Diagnosis not present

## 2021-08-14 DIAGNOSIS — R41841 Cognitive communication deficit: Secondary | ICD-10-CM | POA: Diagnosis not present

## 2021-08-19 DIAGNOSIS — K219 Gastro-esophageal reflux disease without esophagitis: Secondary | ICD-10-CM | POA: Diagnosis not present

## 2021-08-19 DIAGNOSIS — M25562 Pain in left knee: Secondary | ICD-10-CM | POA: Diagnosis not present

## 2021-08-19 DIAGNOSIS — R531 Weakness: Secondary | ICD-10-CM | POA: Diagnosis not present

## 2021-08-19 DIAGNOSIS — E119 Type 2 diabetes mellitus without complications: Secondary | ICD-10-CM | POA: Diagnosis not present

## 2021-08-19 DIAGNOSIS — N189 Chronic kidney disease, unspecified: Secondary | ICD-10-CM | POA: Diagnosis not present

## 2021-08-19 DIAGNOSIS — N390 Urinary tract infection, site not specified: Secondary | ICD-10-CM | POA: Diagnosis not present

## 2021-08-19 DIAGNOSIS — K429 Umbilical hernia without obstruction or gangrene: Secondary | ICD-10-CM | POA: Diagnosis not present

## 2021-08-19 DIAGNOSIS — I10 Essential (primary) hypertension: Secondary | ICD-10-CM | POA: Diagnosis not present

## 2021-08-19 DIAGNOSIS — R600 Localized edema: Secondary | ICD-10-CM | POA: Diagnosis not present

## 2021-08-19 DIAGNOSIS — E876 Hypokalemia: Secondary | ICD-10-CM | POA: Diagnosis not present

## 2021-08-19 DIAGNOSIS — E559 Vitamin D deficiency, unspecified: Secondary | ICD-10-CM | POA: Diagnosis not present

## 2021-08-22 ENCOUNTER — Ambulatory Visit: Payer: PPO | Admitting: Cardiology

## 2021-08-27 DIAGNOSIS — D508 Other iron deficiency anemias: Secondary | ICD-10-CM | POA: Diagnosis not present

## 2021-08-27 DIAGNOSIS — N189 Chronic kidney disease, unspecified: Secondary | ICD-10-CM | POA: Diagnosis not present

## 2021-08-27 DIAGNOSIS — M25562 Pain in left knee: Secondary | ICD-10-CM | POA: Diagnosis not present

## 2021-08-27 DIAGNOSIS — E119 Type 2 diabetes mellitus without complications: Secondary | ICD-10-CM | POA: Diagnosis not present

## 2021-08-27 DIAGNOSIS — I5032 Chronic diastolic (congestive) heart failure: Secondary | ICD-10-CM | POA: Diagnosis not present

## 2021-08-27 DIAGNOSIS — I639 Cerebral infarction, unspecified: Secondary | ICD-10-CM | POA: Diagnosis not present

## 2021-08-27 DIAGNOSIS — R600 Localized edema: Secondary | ICD-10-CM | POA: Diagnosis not present

## 2021-08-27 DIAGNOSIS — K219 Gastro-esophageal reflux disease without esophagitis: Secondary | ICD-10-CM | POA: Diagnosis not present

## 2021-08-27 DIAGNOSIS — E559 Vitamin D deficiency, unspecified: Secondary | ICD-10-CM | POA: Diagnosis not present

## 2021-08-27 DIAGNOSIS — E1142 Type 2 diabetes mellitus with diabetic polyneuropathy: Secondary | ICD-10-CM | POA: Diagnosis not present

## 2021-08-27 DIAGNOSIS — E876 Hypokalemia: Secondary | ICD-10-CM | POA: Diagnosis not present

## 2021-08-27 DIAGNOSIS — I4891 Unspecified atrial fibrillation: Secondary | ICD-10-CM | POA: Diagnosis not present

## 2021-08-27 DIAGNOSIS — F339 Major depressive disorder, recurrent, unspecified: Secondary | ICD-10-CM | POA: Diagnosis not present

## 2021-08-27 DIAGNOSIS — I10 Essential (primary) hypertension: Secondary | ICD-10-CM | POA: Diagnosis not present

## 2021-08-27 DIAGNOSIS — K429 Umbilical hernia without obstruction or gangrene: Secondary | ICD-10-CM | POA: Diagnosis not present

## 2021-08-27 DIAGNOSIS — M1990 Unspecified osteoarthritis, unspecified site: Secondary | ICD-10-CM | POA: Diagnosis not present

## 2021-08-27 DIAGNOSIS — R531 Weakness: Secondary | ICD-10-CM | POA: Diagnosis not present

## 2021-08-27 DIAGNOSIS — F329 Major depressive disorder, single episode, unspecified: Secondary | ICD-10-CM | POA: Diagnosis not present

## 2021-08-28 DIAGNOSIS — M9712XD Periprosthetic fracture around internal prosthetic left knee joint, subsequent encounter: Secondary | ICD-10-CM | POA: Diagnosis not present

## 2021-08-28 DIAGNOSIS — R52 Pain, unspecified: Secondary | ICD-10-CM | POA: Diagnosis not present

## 2021-09-02 DIAGNOSIS — J479 Bronchiectasis, uncomplicated: Secondary | ICD-10-CM | POA: Diagnosis not present

## 2021-09-02 DIAGNOSIS — M9712XD Periprosthetic fracture around internal prosthetic left knee joint, subsequent encounter: Secondary | ICD-10-CM | POA: Diagnosis not present

## 2021-09-02 DIAGNOSIS — M6281 Muscle weakness (generalized): Secondary | ICD-10-CM | POA: Diagnosis not present

## 2021-09-02 DIAGNOSIS — S7002XA Contusion of left hip, initial encounter: Secondary | ICD-10-CM | POA: Diagnosis not present

## 2021-09-02 DIAGNOSIS — I10 Essential (primary) hypertension: Secondary | ICD-10-CM | POA: Diagnosis not present

## 2021-09-02 DIAGNOSIS — M9702XA Periprosthetic fracture around internal prosthetic left hip joint, initial encounter: Secondary | ICD-10-CM | POA: Diagnosis not present

## 2021-09-02 DIAGNOSIS — M1990 Unspecified osteoarthritis, unspecified site: Secondary | ICD-10-CM | POA: Diagnosis not present

## 2021-09-02 DIAGNOSIS — E1122 Type 2 diabetes mellitus with diabetic chronic kidney disease: Secondary | ICD-10-CM | POA: Diagnosis not present

## 2021-09-02 DIAGNOSIS — Z888 Allergy status to other drugs, medicaments and biological substances status: Secondary | ICD-10-CM | POA: Diagnosis not present

## 2021-09-02 DIAGNOSIS — Z7984 Long term (current) use of oral hypoglycemic drugs: Secondary | ICD-10-CM | POA: Diagnosis not present

## 2021-09-02 DIAGNOSIS — I482 Chronic atrial fibrillation, unspecified: Secondary | ICD-10-CM | POA: Diagnosis not present

## 2021-09-02 DIAGNOSIS — M25462 Effusion, left knee: Secondary | ICD-10-CM | POA: Diagnosis not present

## 2021-09-02 DIAGNOSIS — S92402A Displaced unspecified fracture of left great toe, initial encounter for closed fracture: Secondary | ICD-10-CM | POA: Diagnosis not present

## 2021-09-02 DIAGNOSIS — R29898 Other symptoms and signs involving the musculoskeletal system: Secondary | ICD-10-CM | POA: Diagnosis not present

## 2021-09-02 DIAGNOSIS — I5032 Chronic diastolic (congestive) heart failure: Secondary | ICD-10-CM | POA: Diagnosis not present

## 2021-09-02 DIAGNOSIS — Z7982 Long term (current) use of aspirin: Secondary | ICD-10-CM | POA: Diagnosis not present

## 2021-09-02 DIAGNOSIS — Z9181 History of falling: Secondary | ICD-10-CM | POA: Diagnosis not present

## 2021-09-02 DIAGNOSIS — N182 Chronic kidney disease, stage 2 (mild): Secondary | ICD-10-CM | POA: Diagnosis not present

## 2021-09-02 DIAGNOSIS — I129 Hypertensive chronic kidney disease with stage 1 through stage 4 chronic kidney disease, or unspecified chronic kidney disease: Secondary | ICD-10-CM | POA: Diagnosis not present

## 2021-09-02 DIAGNOSIS — N179 Acute kidney failure, unspecified: Secondary | ICD-10-CM | POA: Diagnosis not present

## 2021-09-02 DIAGNOSIS — E785 Hyperlipidemia, unspecified: Secondary | ICD-10-CM | POA: Diagnosis not present

## 2021-09-02 DIAGNOSIS — J8410 Pulmonary fibrosis, unspecified: Secondary | ICD-10-CM | POA: Diagnosis not present

## 2021-09-02 DIAGNOSIS — S72402A Unspecified fracture of lower end of left femur, initial encounter for closed fracture: Secondary | ICD-10-CM | POA: Diagnosis not present

## 2021-09-02 DIAGNOSIS — R41841 Cognitive communication deficit: Secondary | ICD-10-CM | POA: Diagnosis not present

## 2021-09-02 DIAGNOSIS — K219 Gastro-esophageal reflux disease without esophagitis: Secondary | ICD-10-CM | POA: Diagnosis not present

## 2021-09-02 DIAGNOSIS — Z794 Long term (current) use of insulin: Secondary | ICD-10-CM | POA: Diagnosis not present

## 2021-09-02 DIAGNOSIS — E1169 Type 2 diabetes mellitus with other specified complication: Secondary | ICD-10-CM | POA: Diagnosis not present

## 2021-09-02 DIAGNOSIS — E559 Vitamin D deficiency, unspecified: Secondary | ICD-10-CM | POA: Diagnosis not present

## 2021-09-02 DIAGNOSIS — R2689 Other abnormalities of gait and mobility: Secondary | ICD-10-CM | POA: Diagnosis not present

## 2021-09-02 DIAGNOSIS — Z743 Need for continuous supervision: Secondary | ICD-10-CM | POA: Diagnosis not present

## 2021-09-02 DIAGNOSIS — M9712XA Periprosthetic fracture around internal prosthetic left knee joint, initial encounter: Secondary | ICD-10-CM | POA: Diagnosis not present

## 2021-09-02 DIAGNOSIS — E1142 Type 2 diabetes mellitus with diabetic polyneuropathy: Secondary | ICD-10-CM | POA: Diagnosis not present

## 2021-09-02 DIAGNOSIS — Z96652 Presence of left artificial knee joint: Secondary | ICD-10-CM | POA: Diagnosis not present

## 2021-09-02 DIAGNOSIS — R2681 Unsteadiness on feet: Secondary | ICD-10-CM | POA: Diagnosis not present

## 2021-09-02 DIAGNOSIS — Z7901 Long term (current) use of anticoagulants: Secondary | ICD-10-CM | POA: Diagnosis not present

## 2021-09-02 DIAGNOSIS — F32A Depression, unspecified: Secondary | ICD-10-CM | POA: Diagnosis not present

## 2021-09-02 DIAGNOSIS — N390 Urinary tract infection, site not specified: Secondary | ICD-10-CM | POA: Diagnosis not present

## 2021-09-02 DIAGNOSIS — Z8673 Personal history of transient ischemic attack (TIA), and cerebral infarction without residual deficits: Secondary | ICD-10-CM | POA: Diagnosis not present

## 2021-09-02 DIAGNOSIS — I951 Orthostatic hypotension: Secondary | ICD-10-CM | POA: Diagnosis not present

## 2021-09-02 DIAGNOSIS — R1312 Dysphagia, oropharyngeal phase: Secondary | ICD-10-CM | POA: Diagnosis not present

## 2021-09-02 DIAGNOSIS — B9620 Unspecified Escherichia coli [E. coli] as the cause of diseases classified elsewhere: Secondary | ICD-10-CM | POA: Diagnosis not present

## 2021-09-02 DIAGNOSIS — F339 Major depressive disorder, recurrent, unspecified: Secondary | ICD-10-CM | POA: Diagnosis not present

## 2021-09-02 DIAGNOSIS — Z471 Aftercare following joint replacement surgery: Secondary | ICD-10-CM | POA: Diagnosis not present

## 2021-09-02 DIAGNOSIS — Z79899 Other long term (current) drug therapy: Secondary | ICD-10-CM | POA: Diagnosis not present

## 2021-09-05 DIAGNOSIS — N390 Urinary tract infection, site not specified: Secondary | ICD-10-CM | POA: Diagnosis not present

## 2021-09-05 DIAGNOSIS — E1169 Type 2 diabetes mellitus with other specified complication: Secondary | ICD-10-CM | POA: Diagnosis not present

## 2021-09-05 DIAGNOSIS — I4891 Unspecified atrial fibrillation: Secondary | ICD-10-CM | POA: Diagnosis not present

## 2021-09-05 DIAGNOSIS — Z743 Need for continuous supervision: Secondary | ICD-10-CM | POA: Diagnosis not present

## 2021-09-05 DIAGNOSIS — I951 Orthostatic hypotension: Secondary | ICD-10-CM | POA: Diagnosis not present

## 2021-09-05 DIAGNOSIS — R2689 Other abnormalities of gait and mobility: Secondary | ICD-10-CM | POA: Diagnosis not present

## 2021-09-05 DIAGNOSIS — E1142 Type 2 diabetes mellitus with diabetic polyneuropathy: Secondary | ICD-10-CM | POA: Diagnosis not present

## 2021-09-05 DIAGNOSIS — R2681 Unsteadiness on feet: Secondary | ICD-10-CM | POA: Diagnosis not present

## 2021-09-05 DIAGNOSIS — R29898 Other symptoms and signs involving the musculoskeletal system: Secondary | ICD-10-CM | POA: Diagnosis not present

## 2021-09-05 DIAGNOSIS — M1990 Unspecified osteoarthritis, unspecified site: Secondary | ICD-10-CM | POA: Diagnosis not present

## 2021-09-05 DIAGNOSIS — J8410 Pulmonary fibrosis, unspecified: Secondary | ICD-10-CM | POA: Diagnosis not present

## 2021-09-05 DIAGNOSIS — R5381 Other malaise: Secondary | ICD-10-CM | POA: Diagnosis not present

## 2021-09-05 DIAGNOSIS — G4720 Circadian rhythm sleep disorder, unspecified type: Secondary | ICD-10-CM | POA: Diagnosis not present

## 2021-09-05 DIAGNOSIS — R600 Localized edema: Secondary | ICD-10-CM | POA: Diagnosis not present

## 2021-09-05 DIAGNOSIS — Z7901 Long term (current) use of anticoagulants: Secondary | ICD-10-CM | POA: Diagnosis not present

## 2021-09-05 DIAGNOSIS — M6281 Muscle weakness (generalized): Secondary | ICD-10-CM | POA: Diagnosis not present

## 2021-09-05 DIAGNOSIS — J479 Bronchiectasis, uncomplicated: Secondary | ICD-10-CM | POA: Diagnosis not present

## 2021-09-05 DIAGNOSIS — D508 Other iron deficiency anemias: Secondary | ICD-10-CM | POA: Diagnosis not present

## 2021-09-05 DIAGNOSIS — K5900 Constipation, unspecified: Secondary | ICD-10-CM | POA: Diagnosis not present

## 2021-09-05 DIAGNOSIS — I639 Cerebral infarction, unspecified: Secondary | ICD-10-CM | POA: Diagnosis not present

## 2021-09-05 DIAGNOSIS — M25562 Pain in left knee: Secondary | ICD-10-CM | POA: Diagnosis not present

## 2021-09-05 DIAGNOSIS — I10 Essential (primary) hypertension: Secondary | ICD-10-CM | POA: Diagnosis not present

## 2021-09-05 DIAGNOSIS — S92402A Displaced unspecified fracture of left great toe, initial encounter for closed fracture: Secondary | ICD-10-CM | POA: Diagnosis not present

## 2021-09-05 DIAGNOSIS — F32A Depression, unspecified: Secondary | ICD-10-CM | POA: Diagnosis not present

## 2021-09-05 DIAGNOSIS — G4700 Insomnia, unspecified: Secondary | ICD-10-CM | POA: Diagnosis not present

## 2021-09-05 DIAGNOSIS — I5032 Chronic diastolic (congestive) heart failure: Secondary | ICD-10-CM | POA: Diagnosis not present

## 2021-09-05 DIAGNOSIS — N189 Chronic kidney disease, unspecified: Secondary | ICD-10-CM | POA: Diagnosis not present

## 2021-09-05 DIAGNOSIS — E559 Vitamin D deficiency, unspecified: Secondary | ICD-10-CM | POA: Diagnosis not present

## 2021-09-05 DIAGNOSIS — M9712XD Periprosthetic fracture around internal prosthetic left knee joint, subsequent encounter: Secondary | ICD-10-CM | POA: Diagnosis not present

## 2021-09-05 DIAGNOSIS — I482 Chronic atrial fibrillation, unspecified: Secondary | ICD-10-CM | POA: Diagnosis not present

## 2021-09-05 DIAGNOSIS — Z9181 History of falling: Secondary | ICD-10-CM | POA: Diagnosis not present

## 2021-09-05 DIAGNOSIS — F329 Major depressive disorder, single episode, unspecified: Secondary | ICD-10-CM | POA: Diagnosis not present

## 2021-09-05 DIAGNOSIS — E785 Hyperlipidemia, unspecified: Secondary | ICD-10-CM | POA: Diagnosis not present

## 2021-09-05 DIAGNOSIS — N179 Acute kidney failure, unspecified: Secondary | ICD-10-CM | POA: Diagnosis not present

## 2021-09-05 DIAGNOSIS — R1312 Dysphagia, oropharyngeal phase: Secondary | ICD-10-CM | POA: Diagnosis not present

## 2021-09-05 DIAGNOSIS — E119 Type 2 diabetes mellitus without complications: Secondary | ICD-10-CM | POA: Diagnosis not present

## 2021-09-05 DIAGNOSIS — F339 Major depressive disorder, recurrent, unspecified: Secondary | ICD-10-CM | POA: Diagnosis not present

## 2021-09-05 DIAGNOSIS — Z7982 Long term (current) use of aspirin: Secondary | ICD-10-CM | POA: Diagnosis not present

## 2021-09-05 DIAGNOSIS — G8918 Other acute postprocedural pain: Secondary | ICD-10-CM | POA: Diagnosis not present

## 2021-09-05 DIAGNOSIS — Z8673 Personal history of transient ischemic attack (TIA), and cerebral infarction without residual deficits: Secondary | ICD-10-CM | POA: Diagnosis not present

## 2021-09-05 DIAGNOSIS — Z794 Long term (current) use of insulin: Secondary | ICD-10-CM | POA: Diagnosis not present

## 2021-09-05 DIAGNOSIS — K219 Gastro-esophageal reflux disease without esophagitis: Secondary | ICD-10-CM | POA: Diagnosis not present

## 2021-09-05 DIAGNOSIS — G8929 Other chronic pain: Secondary | ICD-10-CM | POA: Diagnosis not present

## 2021-09-05 DIAGNOSIS — M25462 Effusion, left knee: Secondary | ICD-10-CM | POA: Diagnosis not present

## 2021-09-05 DIAGNOSIS — R41841 Cognitive communication deficit: Secondary | ICD-10-CM | POA: Diagnosis not present

## 2021-09-05 DIAGNOSIS — S72402D Unspecified fracture of lower end of left femur, subsequent encounter for closed fracture with routine healing: Secondary | ICD-10-CM | POA: Diagnosis not present

## 2021-09-05 DIAGNOSIS — Z471 Aftercare following joint replacement surgery: Secondary | ICD-10-CM | POA: Diagnosis not present

## 2021-09-06 DIAGNOSIS — G8929 Other chronic pain: Secondary | ICD-10-CM | POA: Diagnosis not present

## 2021-09-09 DIAGNOSIS — F32A Depression, unspecified: Secondary | ICD-10-CM | POA: Diagnosis not present

## 2021-09-09 DIAGNOSIS — E119 Type 2 diabetes mellitus without complications: Secondary | ICD-10-CM | POA: Diagnosis not present

## 2021-09-09 DIAGNOSIS — K5900 Constipation, unspecified: Secondary | ICD-10-CM | POA: Diagnosis not present

## 2021-09-09 DIAGNOSIS — G4720 Circadian rhythm sleep disorder, unspecified type: Secondary | ICD-10-CM | POA: Diagnosis not present

## 2021-09-09 DIAGNOSIS — F329 Major depressive disorder, single episode, unspecified: Secondary | ICD-10-CM | POA: Diagnosis not present

## 2021-09-09 DIAGNOSIS — I10 Essential (primary) hypertension: Secondary | ICD-10-CM | POA: Diagnosis not present

## 2021-09-09 DIAGNOSIS — R600 Localized edema: Secondary | ICD-10-CM | POA: Diagnosis not present

## 2021-09-09 DIAGNOSIS — K219 Gastro-esophageal reflux disease without esophagitis: Secondary | ICD-10-CM | POA: Diagnosis not present

## 2021-09-09 DIAGNOSIS — N390 Urinary tract infection, site not specified: Secondary | ICD-10-CM | POA: Diagnosis not present

## 2021-09-09 DIAGNOSIS — I951 Orthostatic hypotension: Secondary | ICD-10-CM | POA: Diagnosis not present

## 2021-09-09 DIAGNOSIS — G4700 Insomnia, unspecified: Secondary | ICD-10-CM | POA: Diagnosis not present

## 2021-09-09 DIAGNOSIS — M1990 Unspecified osteoarthritis, unspecified site: Secondary | ICD-10-CM | POA: Diagnosis not present

## 2021-09-09 DIAGNOSIS — S72402D Unspecified fracture of lower end of left femur, subsequent encounter for closed fracture with routine healing: Secondary | ICD-10-CM | POA: Diagnosis not present

## 2021-09-09 DIAGNOSIS — G8929 Other chronic pain: Secondary | ICD-10-CM | POA: Diagnosis not present

## 2021-09-09 DIAGNOSIS — N189 Chronic kidney disease, unspecified: Secondary | ICD-10-CM | POA: Diagnosis not present

## 2021-09-10 ENCOUNTER — Non-Acute Institutional Stay: Payer: PPO | Admitting: Family Medicine

## 2021-09-10 ENCOUNTER — Encounter: Payer: Self-pay | Admitting: Family Medicine

## 2021-09-10 VITALS — BP 124/56 | HR 68 | Resp 18

## 2021-09-10 DIAGNOSIS — R5381 Other malaise: Secondary | ICD-10-CM | POA: Diagnosis not present

## 2021-09-10 DIAGNOSIS — M25562 Pain in left knee: Secondary | ICD-10-CM

## 2021-09-10 DIAGNOSIS — G8918 Other acute postprocedural pain: Secondary | ICD-10-CM | POA: Diagnosis not present

## 2021-09-10 NOTE — Progress Notes (Signed)
? ? ?Manufacturing engineer ?Community Palliative Care Consult Note ?Telephone: [phone]  ?Fax: [phone]  ? ? ?Date of encounter: 09/10/21 ?10:30 AM ? ?PATIENT NAME: [name] ?[place] ?[address] ?[place] [address]   ?[phone] (home)  ?DOB: [birth year] ?MRN: [id number] ?PRIMARY CARE PROVIDER:    ?Ivy Lynn, NP,  ?[address] ?[place] [address] ?[phone] ? ?REFERRING PROVIDER:   ?Ivy Lynn, NP ?[address] ?[place],  [place] [address] ?[phone] ? ?RESPONSIBLE PARTY:    ?Contact Information   ? ? Name Relation Home Work Mobile  ? [place], [place] [phone]  [phone]  ? ?  ? ? ? ?I met face to face with patient in [facility] facility. Palliative Care was asked to follow this patient by consultation request of  Ivy Lynn, NP to address advance care planning and complex medical decision making. This is an initial SNF visit. ? ?                           ASSESSMENT, SYMPTOM MANAGEMENT AND PLAN / RECOMMENDATION ? Acute postoperative pain of left knee ?S/p revision of left knee replacement in the last week. ?Refuses Tylenol in any form ?Gave an additional 5 days of Oxy IR 5 mg -1/2 tab TID x 5 days. ?Change Robaxin 500 mg BID x 5 days then resume Q 12 hrs prn ?Attempt premedication prior to PT to improve ability to participate. ? ?2.    Physical deconditioning ?Poor safety awareness, unsteady gait. ?Continue PT for rehab of revision of left TKR ? ? ? ?Advance Care Planning/Goals of Care: Goals include to maximize quality of life and symptom management. Patient gave her permission to discuss. ?Our advance care planning conversation included a discussion about:    ?The value and importance of advance care planning-MOST form completed and on file ?Identification of a healthcare agent is son [name] ?Review of an advance directive document-MOST. ?Decision not to resuscitate or to de-escalate disease focused treatments due to poor  prognosis. ?CODE STATUS: ?MOST: ?DNR/DNI ?Limited intervention ?IV fluid and antibiotics if needed ?No feeding tube ? ? ? ?Follow up Palliative Care Visit: Palliative care will continue to follow for complex medical decision making, advance care planning, and clarification of goals. Return 2 weeks or prn.  ? ?This visit was coded based on medical decision making (MDM). ? ?PPS: 50% ? ?HOSPICE ELIGIBILITY/DIAGNOSIS: TBD ? ?Chief Complaint:  ?Palliative Care is following as pt was recently inpatient 5/1-4/23 for revision replacement of left knee following fall and periprosthetic fracture. She is readmitted to her SNF. ? ?HISTORY OF PRESENT ILLNESS:  RANYAH [name] is a 88 Joan.o. year old female with hypertension, permanent atrial fibrillation, hx of CVA, chronic diastolic CHF, pulmonary fibrosis with Oxygen dependence, hx of SAH and SDH.  Labs 5/2-4/23 CBC with WBC high at 12.3, RBC low at 3.96, low HGB 10.8/HCT 34.2%.  09/02/21 Cath urine shows > 100,000 ESBL E coli susceptible to Fosfomycin with recent AKI with Cr 1.02, BUN 31 and eGFr 51 which was corrected as of 5/4 with Cr 0.59/BUN 14/eGFR 84.  She was started on Doxycycline 100 mg BID x 5 days and Monurol 3 gm every 3rd day starting 09/07/21.  She was given Robaxin 500 mg Q 12 hrs prn and Oxycodone IR 1/2 of 5 mg tab every 4 hrs as needed for 5 days ending on 09/10/21. Blood sugars were showing good control  while inpatient in the 106-120s range. WBAT. ? ?History obtained from review of EMR, discussion with facility staff and/or Ms. Sausedo.  ?I reviewed available lab, medications, imaging, studies and related documents from the EMR.  Records reviewed and summarized above.  ? ?ROS ?General: NAD, denies fever and chills ?EYES: denies vision changes ?ENMT: denies dysphagia ?Cardiovascular: denies chest pain, denies DOE ?Pulmonary: denies cough, denies increased SOB ?Abdomen: endorses poor appetite stating she does not like the food, denies constipation, endorses  continence of bowel ?GU: Denies dysuria, endorses continence of urine ?MSK:  denies increased weakness, Currently bedbound unless working with PT ?Skin: denies rashes, has surgical scar to left knee ?Neurological: endorses left knee and leg pain worse with PT, denies insomnia ?Psych: Endorses positive mood ?Heme/lymph/immuno: denies bruises, abnormal bleeding ? ?Physical Exam: ?Current and past weights: 155 lbs  as of 09/02/21 ?Constitutional: NAD ?General: frail appearing, thin ?EYES: anicteric sclera, lids intact, no discharge  ?ENMT: intact hearing, oral mucous membranes moist, dentition intact ?CV: S1S2, IRIR, No LE edema  ?Pulmonary: CTAB, no increased work of breathing, no cough, room air ?Abdomen: Normo-active BS + 4 quadrants, soft, no tenderness or distension ?GU: deferred ?MSK: no sarcopenia, moves all extremities, Currently WBAT with walker and PT otherwise bedbound ?Skin: warm and dry, Left knee dressing clean dry and intact with no surrounding erythema, edema ?Neuro:  no generalized weakness,  no cognitive impairment ?Psych: non-anxious affect, A and O x 3 ?Hem/lymph/immuno: no widespread bruising ? ? ?Thank you for the opportunity to participate in the care of [name].  The palliative care team will continue to follow. Please call our office at [phone] if we can be of additional assistance.  ? ?Marijo Conception, FNP -C ? ?COVID-19 PATIENT SCREENING TOOL ?Asked and negative response unless otherwise noted:  ? ?Have you had symptoms of covid, tested positive or been in contact with someone with symptoms/positive test in the past 5-10 days?  no ?

## 2021-09-12 DIAGNOSIS — N390 Urinary tract infection, site not specified: Secondary | ICD-10-CM | POA: Diagnosis not present

## 2021-09-12 DIAGNOSIS — E119 Type 2 diabetes mellitus without complications: Secondary | ICD-10-CM | POA: Diagnosis not present

## 2021-09-12 DIAGNOSIS — M1990 Unspecified osteoarthritis, unspecified site: Secondary | ICD-10-CM | POA: Diagnosis not present

## 2021-09-12 DIAGNOSIS — K5900 Constipation, unspecified: Secondary | ICD-10-CM | POA: Diagnosis not present

## 2021-09-12 DIAGNOSIS — F32A Depression, unspecified: Secondary | ICD-10-CM | POA: Diagnosis not present

## 2021-09-12 DIAGNOSIS — R600 Localized edema: Secondary | ICD-10-CM | POA: Diagnosis not present

## 2021-09-12 DIAGNOSIS — I951 Orthostatic hypotension: Secondary | ICD-10-CM | POA: Diagnosis not present

## 2021-09-12 DIAGNOSIS — G4700 Insomnia, unspecified: Secondary | ICD-10-CM | POA: Diagnosis not present

## 2021-09-12 DIAGNOSIS — S72402D Unspecified fracture of lower end of left femur, subsequent encounter for closed fracture with routine healing: Secondary | ICD-10-CM | POA: Diagnosis not present

## 2021-09-12 DIAGNOSIS — F329 Major depressive disorder, single episode, unspecified: Secondary | ICD-10-CM | POA: Diagnosis not present

## 2021-09-12 DIAGNOSIS — K219 Gastro-esophageal reflux disease without esophagitis: Secondary | ICD-10-CM | POA: Diagnosis not present

## 2021-09-12 DIAGNOSIS — I10 Essential (primary) hypertension: Secondary | ICD-10-CM | POA: Diagnosis not present

## 2021-09-16 DIAGNOSIS — G8918 Other acute postprocedural pain: Secondary | ICD-10-CM

## 2021-09-16 DIAGNOSIS — M25562 Pain in left knee: Secondary | ICD-10-CM | POA: Insufficient documentation

## 2021-09-16 HISTORY — DX: Pain in left knee: G89.18

## 2021-09-19 DIAGNOSIS — M9712XD Periprosthetic fracture around internal prosthetic left knee joint, subsequent encounter: Secondary | ICD-10-CM | POA: Diagnosis not present

## 2021-09-24 DIAGNOSIS — D508 Other iron deficiency anemias: Secondary | ICD-10-CM | POA: Diagnosis not present

## 2021-09-24 DIAGNOSIS — I10 Essential (primary) hypertension: Secondary | ICD-10-CM | POA: Diagnosis not present

## 2021-09-24 DIAGNOSIS — E1142 Type 2 diabetes mellitus with diabetic polyneuropathy: Secondary | ICD-10-CM | POA: Diagnosis not present

## 2021-09-24 DIAGNOSIS — F339 Major depressive disorder, recurrent, unspecified: Secondary | ICD-10-CM | POA: Diagnosis not present

## 2021-09-24 DIAGNOSIS — E119 Type 2 diabetes mellitus without complications: Secondary | ICD-10-CM | POA: Diagnosis not present

## 2021-09-24 DIAGNOSIS — M1990 Unspecified osteoarthritis, unspecified site: Secondary | ICD-10-CM | POA: Diagnosis not present

## 2021-09-24 DIAGNOSIS — I4891 Unspecified atrial fibrillation: Secondary | ICD-10-CM | POA: Diagnosis not present

## 2021-09-24 DIAGNOSIS — E559 Vitamin D deficiency, unspecified: Secondary | ICD-10-CM | POA: Diagnosis not present

## 2021-09-24 DIAGNOSIS — F329 Major depressive disorder, single episode, unspecified: Secondary | ICD-10-CM | POA: Diagnosis not present

## 2021-09-24 DIAGNOSIS — I639 Cerebral infarction, unspecified: Secondary | ICD-10-CM | POA: Diagnosis not present

## 2021-09-24 DIAGNOSIS — N189 Chronic kidney disease, unspecified: Secondary | ICD-10-CM | POA: Diagnosis not present

## 2021-10-03 DIAGNOSIS — F329 Major depressive disorder, single episode, unspecified: Secondary | ICD-10-CM | POA: Diagnosis not present

## 2021-10-03 DIAGNOSIS — I10 Essential (primary) hypertension: Secondary | ICD-10-CM | POA: Diagnosis not present

## 2021-10-03 DIAGNOSIS — E1142 Type 2 diabetes mellitus with diabetic polyneuropathy: Secondary | ICD-10-CM | POA: Diagnosis not present

## 2021-10-03 DIAGNOSIS — G8929 Other chronic pain: Secondary | ICD-10-CM | POA: Diagnosis not present

## 2021-10-03 DIAGNOSIS — Z794 Long term (current) use of insulin: Secondary | ICD-10-CM | POA: Diagnosis not present

## 2021-10-03 DIAGNOSIS — I951 Orthostatic hypotension: Secondary | ICD-10-CM | POA: Diagnosis not present

## 2021-10-03 DIAGNOSIS — I5032 Chronic diastolic (congestive) heart failure: Secondary | ICD-10-CM | POA: Diagnosis not present

## 2021-10-03 DIAGNOSIS — J479 Bronchiectasis, uncomplicated: Secondary | ICD-10-CM | POA: Diagnosis not present

## 2021-10-03 DIAGNOSIS — I4891 Unspecified atrial fibrillation: Secondary | ICD-10-CM | POA: Diagnosis not present

## 2021-10-03 DIAGNOSIS — E1169 Type 2 diabetes mellitus with other specified complication: Secondary | ICD-10-CM | POA: Diagnosis not present

## 2021-10-03 DIAGNOSIS — M1990 Unspecified osteoarthritis, unspecified site: Secondary | ICD-10-CM | POA: Diagnosis not present

## 2021-10-03 DIAGNOSIS — K219 Gastro-esophageal reflux disease without esophagitis: Secondary | ICD-10-CM | POA: Diagnosis not present

## 2021-10-03 DIAGNOSIS — Z7982 Long term (current) use of aspirin: Secondary | ICD-10-CM | POA: Diagnosis not present

## 2021-10-03 DIAGNOSIS — Z471 Aftercare following joint replacement surgery: Secondary | ICD-10-CM | POA: Diagnosis not present

## 2021-10-03 DIAGNOSIS — F339 Major depressive disorder, recurrent, unspecified: Secondary | ICD-10-CM | POA: Diagnosis not present

## 2021-10-03 DIAGNOSIS — G4720 Circadian rhythm sleep disorder, unspecified type: Secondary | ICD-10-CM | POA: Diagnosis not present

## 2021-10-03 DIAGNOSIS — G4700 Insomnia, unspecified: Secondary | ICD-10-CM | POA: Diagnosis not present

## 2021-10-03 DIAGNOSIS — Z9181 History of falling: Secondary | ICD-10-CM | POA: Diagnosis not present

## 2021-10-03 DIAGNOSIS — E785 Hyperlipidemia, unspecified: Secondary | ICD-10-CM | POA: Diagnosis not present

## 2021-10-03 DIAGNOSIS — J8410 Pulmonary fibrosis, unspecified: Secondary | ICD-10-CM | POA: Diagnosis not present

## 2021-10-03 DIAGNOSIS — R600 Localized edema: Secondary | ICD-10-CM | POA: Diagnosis not present

## 2021-10-03 DIAGNOSIS — N390 Urinary tract infection, site not specified: Secondary | ICD-10-CM | POA: Diagnosis not present

## 2021-10-03 DIAGNOSIS — Z8673 Personal history of transient ischemic attack (TIA), and cerebral infarction without residual deficits: Secondary | ICD-10-CM | POA: Diagnosis not present

## 2021-10-03 DIAGNOSIS — E559 Vitamin D deficiency, unspecified: Secondary | ICD-10-CM | POA: Diagnosis not present

## 2021-10-03 DIAGNOSIS — I482 Chronic atrial fibrillation, unspecified: Secondary | ICD-10-CM | POA: Diagnosis not present

## 2021-10-03 DIAGNOSIS — E119 Type 2 diabetes mellitus without complications: Secondary | ICD-10-CM | POA: Diagnosis not present

## 2021-10-03 DIAGNOSIS — N189 Chronic kidney disease, unspecified: Secondary | ICD-10-CM | POA: Diagnosis not present

## 2021-10-03 DIAGNOSIS — F32A Depression, unspecified: Secondary | ICD-10-CM | POA: Diagnosis not present

## 2021-10-03 DIAGNOSIS — Z7901 Long term (current) use of anticoagulants: Secondary | ICD-10-CM | POA: Diagnosis not present

## 2021-10-03 DIAGNOSIS — M6281 Muscle weakness (generalized): Secondary | ICD-10-CM | POA: Diagnosis not present

## 2021-10-07 DIAGNOSIS — G8929 Other chronic pain: Secondary | ICD-10-CM | POA: Diagnosis not present

## 2021-10-07 DIAGNOSIS — G4720 Circadian rhythm sleep disorder, unspecified type: Secondary | ICD-10-CM | POA: Diagnosis not present

## 2021-10-07 DIAGNOSIS — F329 Major depressive disorder, single episode, unspecified: Secondary | ICD-10-CM | POA: Diagnosis not present

## 2021-10-09 DIAGNOSIS — I10 Essential (primary) hypertension: Secondary | ICD-10-CM | POA: Diagnosis not present

## 2021-10-09 DIAGNOSIS — E559 Vitamin D deficiency, unspecified: Secondary | ICD-10-CM | POA: Diagnosis not present

## 2021-10-09 DIAGNOSIS — M1990 Unspecified osteoarthritis, unspecified site: Secondary | ICD-10-CM | POA: Diagnosis not present

## 2021-10-09 DIAGNOSIS — N390 Urinary tract infection, site not specified: Secondary | ICD-10-CM | POA: Diagnosis not present

## 2021-10-09 DIAGNOSIS — N189 Chronic kidney disease, unspecified: Secondary | ICD-10-CM | POA: Diagnosis not present

## 2021-10-09 DIAGNOSIS — K219 Gastro-esophageal reflux disease without esophagitis: Secondary | ICD-10-CM | POA: Diagnosis not present

## 2021-10-09 DIAGNOSIS — R600 Localized edema: Secondary | ICD-10-CM | POA: Diagnosis not present

## 2021-10-09 DIAGNOSIS — G4700 Insomnia, unspecified: Secondary | ICD-10-CM | POA: Diagnosis not present

## 2021-10-09 DIAGNOSIS — I4891 Unspecified atrial fibrillation: Secondary | ICD-10-CM | POA: Diagnosis not present

## 2021-10-09 DIAGNOSIS — E119 Type 2 diabetes mellitus without complications: Secondary | ICD-10-CM | POA: Diagnosis not present

## 2021-10-09 DIAGNOSIS — F32A Depression, unspecified: Secondary | ICD-10-CM | POA: Diagnosis not present

## 2021-10-09 DIAGNOSIS — G8929 Other chronic pain: Secondary | ICD-10-CM | POA: Diagnosis not present

## 2021-10-10 DIAGNOSIS — G8929 Other chronic pain: Secondary | ICD-10-CM | POA: Diagnosis not present

## 2021-10-10 DIAGNOSIS — E559 Vitamin D deficiency, unspecified: Secondary | ICD-10-CM | POA: Diagnosis not present

## 2021-10-10 DIAGNOSIS — I4891 Unspecified atrial fibrillation: Secondary | ICD-10-CM | POA: Diagnosis not present

## 2021-10-10 DIAGNOSIS — S72402D Unspecified fracture of lower end of left femur, subsequent encounter for closed fracture with routine healing: Secondary | ICD-10-CM | POA: Diagnosis not present

## 2021-10-10 DIAGNOSIS — K219 Gastro-esophageal reflux disease without esophagitis: Secondary | ICD-10-CM | POA: Diagnosis not present

## 2021-10-10 DIAGNOSIS — M1990 Unspecified osteoarthritis, unspecified site: Secondary | ICD-10-CM | POA: Diagnosis not present

## 2021-10-10 DIAGNOSIS — I10 Essential (primary) hypertension: Secondary | ICD-10-CM | POA: Diagnosis not present

## 2021-10-10 DIAGNOSIS — L0390 Cellulitis, unspecified: Secondary | ICD-10-CM | POA: Diagnosis not present

## 2021-10-10 DIAGNOSIS — F32A Depression, unspecified: Secondary | ICD-10-CM | POA: Diagnosis not present

## 2021-10-16 DIAGNOSIS — M6281 Muscle weakness (generalized): Secondary | ICD-10-CM | POA: Diagnosis not present

## 2021-10-16 DIAGNOSIS — R2681 Unsteadiness on feet: Secondary | ICD-10-CM | POA: Diagnosis not present

## 2021-10-16 DIAGNOSIS — R3914 Feeling of incomplete bladder emptying: Secondary | ICD-10-CM | POA: Diagnosis not present

## 2021-10-16 DIAGNOSIS — R300 Dysuria: Secondary | ICD-10-CM | POA: Diagnosis not present

## 2021-10-16 DIAGNOSIS — N3020 Other chronic cystitis without hematuria: Secondary | ICD-10-CM | POA: Diagnosis not present

## 2021-10-16 DIAGNOSIS — Z741 Need for assistance with personal care: Secondary | ICD-10-CM | POA: Diagnosis not present

## 2021-10-16 DIAGNOSIS — E1159 Type 2 diabetes mellitus with other circulatory complications: Secondary | ICD-10-CM | POA: Diagnosis not present

## 2021-10-16 DIAGNOSIS — B351 Tinea unguium: Secondary | ICD-10-CM | POA: Diagnosis not present

## 2021-10-23 DIAGNOSIS — N189 Chronic kidney disease, unspecified: Secondary | ICD-10-CM | POA: Diagnosis not present

## 2021-10-23 DIAGNOSIS — E559 Vitamin D deficiency, unspecified: Secondary | ICD-10-CM | POA: Diagnosis not present

## 2021-10-23 DIAGNOSIS — I10 Essential (primary) hypertension: Secondary | ICD-10-CM | POA: Diagnosis not present

## 2021-10-23 DIAGNOSIS — M1990 Unspecified osteoarthritis, unspecified site: Secondary | ICD-10-CM | POA: Diagnosis not present

## 2021-10-23 DIAGNOSIS — E1142 Type 2 diabetes mellitus with diabetic polyneuropathy: Secondary | ICD-10-CM | POA: Diagnosis not present

## 2021-10-23 DIAGNOSIS — E119 Type 2 diabetes mellitus without complications: Secondary | ICD-10-CM | POA: Diagnosis not present

## 2021-10-23 DIAGNOSIS — F339 Major depressive disorder, recurrent, unspecified: Secondary | ICD-10-CM | POA: Diagnosis not present

## 2021-10-28 DIAGNOSIS — E1165 Type 2 diabetes mellitus with hyperglycemia: Secondary | ICD-10-CM | POA: Diagnosis not present

## 2021-10-31 DIAGNOSIS — I10 Essential (primary) hypertension: Secondary | ICD-10-CM | POA: Diagnosis not present

## 2021-11-02 DIAGNOSIS — R2681 Unsteadiness on feet: Secondary | ICD-10-CM | POA: Diagnosis not present

## 2021-11-02 DIAGNOSIS — M6281 Muscle weakness (generalized): Secondary | ICD-10-CM | POA: Diagnosis not present

## 2021-11-02 DIAGNOSIS — Z741 Need for assistance with personal care: Secondary | ICD-10-CM | POA: Diagnosis not present

## 2021-11-04 DIAGNOSIS — G8929 Other chronic pain: Secondary | ICD-10-CM | POA: Diagnosis not present

## 2021-11-04 DIAGNOSIS — F329 Major depressive disorder, single episode, unspecified: Secondary | ICD-10-CM | POA: Diagnosis not present

## 2021-11-04 DIAGNOSIS — G4720 Circadian rhythm sleep disorder, unspecified type: Secondary | ICD-10-CM | POA: Diagnosis not present

## 2021-11-06 DIAGNOSIS — I10 Essential (primary) hypertension: Secondary | ICD-10-CM | POA: Diagnosis not present

## 2021-11-06 DIAGNOSIS — E559 Vitamin D deficiency, unspecified: Secondary | ICD-10-CM | POA: Diagnosis not present

## 2021-11-06 DIAGNOSIS — F329 Major depressive disorder, single episode, unspecified: Secondary | ICD-10-CM | POA: Diagnosis not present

## 2021-11-06 DIAGNOSIS — N189 Chronic kidney disease, unspecified: Secondary | ICD-10-CM | POA: Diagnosis not present

## 2021-11-06 DIAGNOSIS — R600 Localized edema: Secondary | ICD-10-CM | POA: Diagnosis not present

## 2021-11-06 DIAGNOSIS — D508 Other iron deficiency anemias: Secondary | ICD-10-CM | POA: Diagnosis not present

## 2021-11-06 DIAGNOSIS — L0390 Cellulitis, unspecified: Secondary | ICD-10-CM | POA: Diagnosis not present

## 2021-11-06 DIAGNOSIS — I739 Peripheral vascular disease, unspecified: Secondary | ICD-10-CM | POA: Diagnosis not present

## 2021-11-06 DIAGNOSIS — N390 Urinary tract infection, site not specified: Secondary | ICD-10-CM | POA: Diagnosis not present

## 2021-11-06 DIAGNOSIS — E119 Type 2 diabetes mellitus without complications: Secondary | ICD-10-CM | POA: Diagnosis not present

## 2021-11-06 DIAGNOSIS — K5900 Constipation, unspecified: Secondary | ICD-10-CM | POA: Diagnosis not present

## 2021-11-06 DIAGNOSIS — I4891 Unspecified atrial fibrillation: Secondary | ICD-10-CM | POA: Diagnosis not present

## 2021-11-07 DIAGNOSIS — R0989 Other specified symptoms and signs involving the circulatory and respiratory systems: Secondary | ICD-10-CM | POA: Diagnosis not present

## 2021-11-07 DIAGNOSIS — M1990 Unspecified osteoarthritis, unspecified site: Secondary | ICD-10-CM | POA: Diagnosis not present

## 2021-11-07 DIAGNOSIS — E119 Type 2 diabetes mellitus without complications: Secondary | ICD-10-CM | POA: Diagnosis not present

## 2021-11-07 DIAGNOSIS — R600 Localized edema: Secondary | ICD-10-CM | POA: Diagnosis not present

## 2021-11-07 DIAGNOSIS — S72402D Unspecified fracture of lower end of left femur, subsequent encounter for closed fracture with routine healing: Secondary | ICD-10-CM | POA: Diagnosis not present

## 2021-11-07 DIAGNOSIS — E559 Vitamin D deficiency, unspecified: Secondary | ICD-10-CM | POA: Diagnosis not present

## 2021-11-07 DIAGNOSIS — D508 Other iron deficiency anemias: Secondary | ICD-10-CM | POA: Diagnosis not present

## 2021-11-07 DIAGNOSIS — N390 Urinary tract infection, site not specified: Secondary | ICD-10-CM | POA: Diagnosis not present

## 2021-11-07 DIAGNOSIS — K219 Gastro-esophageal reflux disease without esophagitis: Secondary | ICD-10-CM | POA: Diagnosis not present

## 2021-11-07 DIAGNOSIS — K5900 Constipation, unspecified: Secondary | ICD-10-CM | POA: Diagnosis not present

## 2021-11-07 DIAGNOSIS — N189 Chronic kidney disease, unspecified: Secondary | ICD-10-CM | POA: Diagnosis not present

## 2021-11-07 DIAGNOSIS — R601 Generalized edema: Secondary | ICD-10-CM | POA: Diagnosis not present

## 2021-11-07 DIAGNOSIS — G4700 Insomnia, unspecified: Secondary | ICD-10-CM | POA: Diagnosis not present

## 2021-11-07 DIAGNOSIS — F32A Depression, unspecified: Secondary | ICD-10-CM | POA: Diagnosis not present

## 2021-11-15 DIAGNOSIS — N3020 Other chronic cystitis without hematuria: Secondary | ICD-10-CM | POA: Diagnosis not present

## 2021-11-15 DIAGNOSIS — R3914 Feeling of incomplete bladder emptying: Secondary | ICD-10-CM | POA: Diagnosis not present

## 2021-11-20 DIAGNOSIS — I4891 Unspecified atrial fibrillation: Secondary | ICD-10-CM | POA: Diagnosis not present

## 2021-11-20 DIAGNOSIS — B351 Tinea unguium: Secondary | ICD-10-CM | POA: Diagnosis not present

## 2021-11-20 DIAGNOSIS — I10 Essential (primary) hypertension: Secondary | ICD-10-CM | POA: Diagnosis not present

## 2021-11-20 DIAGNOSIS — N189 Chronic kidney disease, unspecified: Secondary | ICD-10-CM | POA: Diagnosis not present

## 2021-11-20 DIAGNOSIS — R600 Localized edema: Secondary | ICD-10-CM | POA: Diagnosis not present

## 2021-11-20 DIAGNOSIS — K219 Gastro-esophageal reflux disease without esophagitis: Secondary | ICD-10-CM | POA: Diagnosis not present

## 2021-11-20 DIAGNOSIS — D508 Other iron deficiency anemias: Secondary | ICD-10-CM | POA: Diagnosis not present

## 2021-11-20 DIAGNOSIS — K5900 Constipation, unspecified: Secondary | ICD-10-CM | POA: Diagnosis not present

## 2021-11-20 DIAGNOSIS — N390 Urinary tract infection, site not specified: Secondary | ICD-10-CM | POA: Diagnosis not present

## 2021-11-20 DIAGNOSIS — E559 Vitamin D deficiency, unspecified: Secondary | ICD-10-CM | POA: Diagnosis not present

## 2021-11-20 DIAGNOSIS — R0989 Other specified symptoms and signs involving the circulatory and respiratory systems: Secondary | ICD-10-CM | POA: Diagnosis not present

## 2021-11-21 DIAGNOSIS — R0989 Other specified symptoms and signs involving the circulatory and respiratory systems: Secondary | ICD-10-CM | POA: Diagnosis not present

## 2021-11-22 DIAGNOSIS — R600 Localized edema: Secondary | ICD-10-CM | POA: Diagnosis not present

## 2021-11-22 DIAGNOSIS — B351 Tinea unguium: Secondary | ICD-10-CM | POA: Diagnosis not present

## 2021-11-22 DIAGNOSIS — I739 Peripheral vascular disease, unspecified: Secondary | ICD-10-CM | POA: Diagnosis not present

## 2021-11-22 DIAGNOSIS — K5900 Constipation, unspecified: Secondary | ICD-10-CM | POA: Diagnosis not present

## 2021-11-22 DIAGNOSIS — G4700 Insomnia, unspecified: Secondary | ICD-10-CM | POA: Diagnosis not present

## 2021-11-22 DIAGNOSIS — E559 Vitamin D deficiency, unspecified: Secondary | ICD-10-CM | POA: Diagnosis not present

## 2021-11-22 DIAGNOSIS — F329 Major depressive disorder, single episode, unspecified: Secondary | ICD-10-CM | POA: Diagnosis not present

## 2021-11-22 DIAGNOSIS — M21619 Bunion of unspecified foot: Secondary | ICD-10-CM | POA: Diagnosis not present

## 2021-11-22 DIAGNOSIS — J9811 Atelectasis: Secondary | ICD-10-CM | POA: Diagnosis not present

## 2021-11-22 DIAGNOSIS — I4891 Unspecified atrial fibrillation: Secondary | ICD-10-CM | POA: Diagnosis not present

## 2021-11-22 DIAGNOSIS — N390 Urinary tract infection, site not specified: Secondary | ICD-10-CM | POA: Diagnosis not present

## 2021-11-22 DIAGNOSIS — N189 Chronic kidney disease, unspecified: Secondary | ICD-10-CM | POA: Diagnosis not present

## 2021-11-22 DIAGNOSIS — I10 Essential (primary) hypertension: Secondary | ICD-10-CM | POA: Diagnosis not present

## 2021-11-22 DIAGNOSIS — K219 Gastro-esophageal reflux disease without esophagitis: Secondary | ICD-10-CM | POA: Diagnosis not present

## 2021-11-22 DIAGNOSIS — E119 Type 2 diabetes mellitus without complications: Secondary | ICD-10-CM | POA: Diagnosis not present

## 2021-11-26 DIAGNOSIS — K219 Gastro-esophageal reflux disease without esophagitis: Secondary | ICD-10-CM | POA: Diagnosis not present

## 2021-11-26 DIAGNOSIS — G4700 Insomnia, unspecified: Secondary | ICD-10-CM | POA: Diagnosis not present

## 2021-11-26 DIAGNOSIS — J9811 Atelectasis: Secondary | ICD-10-CM | POA: Diagnosis not present

## 2021-11-26 DIAGNOSIS — N390 Urinary tract infection, site not specified: Secondary | ICD-10-CM | POA: Diagnosis not present

## 2021-11-26 DIAGNOSIS — R600 Localized edema: Secondary | ICD-10-CM | POA: Diagnosis not present

## 2021-11-26 DIAGNOSIS — I10 Essential (primary) hypertension: Secondary | ICD-10-CM | POA: Diagnosis not present

## 2021-11-26 DIAGNOSIS — E559 Vitamin D deficiency, unspecified: Secondary | ICD-10-CM | POA: Diagnosis not present

## 2021-11-26 DIAGNOSIS — N189 Chronic kidney disease, unspecified: Secondary | ICD-10-CM | POA: Diagnosis not present

## 2021-11-26 DIAGNOSIS — F339 Major depressive disorder, recurrent, unspecified: Secondary | ICD-10-CM | POA: Diagnosis not present

## 2021-11-26 DIAGNOSIS — E119 Type 2 diabetes mellitus without complications: Secondary | ICD-10-CM | POA: Diagnosis not present

## 2021-11-26 DIAGNOSIS — U071 COVID-19: Secondary | ICD-10-CM | POA: Diagnosis not present

## 2021-11-26 DIAGNOSIS — M1990 Unspecified osteoarthritis, unspecified site: Secondary | ICD-10-CM | POA: Diagnosis not present

## 2021-11-27 DIAGNOSIS — G4700 Insomnia, unspecified: Secondary | ICD-10-CM | POA: Diagnosis not present

## 2021-11-27 DIAGNOSIS — U071 COVID-19: Secondary | ICD-10-CM | POA: Diagnosis not present

## 2021-11-27 DIAGNOSIS — I10 Essential (primary) hypertension: Secondary | ICD-10-CM | POA: Diagnosis not present

## 2021-11-27 DIAGNOSIS — D508 Other iron deficiency anemias: Secondary | ICD-10-CM | POA: Diagnosis not present

## 2021-11-27 DIAGNOSIS — N189 Chronic kidney disease, unspecified: Secondary | ICD-10-CM | POA: Diagnosis not present

## 2021-11-27 DIAGNOSIS — K5900 Constipation, unspecified: Secondary | ICD-10-CM | POA: Diagnosis not present

## 2021-11-27 DIAGNOSIS — M1990 Unspecified osteoarthritis, unspecified site: Secondary | ICD-10-CM | POA: Diagnosis not present

## 2021-11-27 DIAGNOSIS — I739 Peripheral vascular disease, unspecified: Secondary | ICD-10-CM | POA: Diagnosis not present

## 2021-11-27 DIAGNOSIS — G8929 Other chronic pain: Secondary | ICD-10-CM | POA: Diagnosis not present

## 2021-11-27 DIAGNOSIS — R600 Localized edema: Secondary | ICD-10-CM | POA: Diagnosis not present

## 2021-11-27 DIAGNOSIS — N390 Urinary tract infection, site not specified: Secondary | ICD-10-CM | POA: Diagnosis not present

## 2021-11-27 DIAGNOSIS — I4891 Unspecified atrial fibrillation: Secondary | ICD-10-CM | POA: Diagnosis not present

## 2021-12-06 DIAGNOSIS — M6281 Muscle weakness (generalized): Secondary | ICD-10-CM | POA: Diagnosis not present

## 2021-12-06 DIAGNOSIS — Z741 Need for assistance with personal care: Secondary | ICD-10-CM | POA: Diagnosis not present

## 2021-12-11 DIAGNOSIS — G4720 Circadian rhythm sleep disorder, unspecified type: Secondary | ICD-10-CM | POA: Diagnosis not present

## 2021-12-11 DIAGNOSIS — G8929 Other chronic pain: Secondary | ICD-10-CM | POA: Diagnosis not present

## 2021-12-11 DIAGNOSIS — F329 Major depressive disorder, single episode, unspecified: Secondary | ICD-10-CM | POA: Diagnosis not present

## 2021-12-19 DIAGNOSIS — K219 Gastro-esophageal reflux disease without esophagitis: Secondary | ICD-10-CM | POA: Diagnosis not present

## 2021-12-19 DIAGNOSIS — E119 Type 2 diabetes mellitus without complications: Secondary | ICD-10-CM | POA: Diagnosis not present

## 2021-12-19 DIAGNOSIS — R6889 Other general symptoms and signs: Secondary | ICD-10-CM | POA: Diagnosis not present

## 2021-12-19 DIAGNOSIS — E559 Vitamin D deficiency, unspecified: Secondary | ICD-10-CM | POA: Diagnosis not present

## 2021-12-19 DIAGNOSIS — I10 Essential (primary) hypertension: Secondary | ICD-10-CM | POA: Diagnosis not present

## 2021-12-19 DIAGNOSIS — N390 Urinary tract infection, site not specified: Secondary | ICD-10-CM | POA: Diagnosis not present

## 2021-12-19 DIAGNOSIS — M1990 Unspecified osteoarthritis, unspecified site: Secondary | ICD-10-CM | POA: Diagnosis not present

## 2021-12-19 DIAGNOSIS — R600 Localized edema: Secondary | ICD-10-CM | POA: Diagnosis not present

## 2021-12-19 DIAGNOSIS — G4700 Insomnia, unspecified: Secondary | ICD-10-CM | POA: Diagnosis not present

## 2021-12-19 DIAGNOSIS — U071 COVID-19: Secondary | ICD-10-CM | POA: Diagnosis not present

## 2021-12-19 DIAGNOSIS — F32A Depression, unspecified: Secondary | ICD-10-CM | POA: Diagnosis not present

## 2021-12-19 DIAGNOSIS — I4891 Unspecified atrial fibrillation: Secondary | ICD-10-CM | POA: Diagnosis not present

## 2021-12-24 DIAGNOSIS — E1159 Type 2 diabetes mellitus with other circulatory complications: Secondary | ICD-10-CM | POA: Diagnosis not present

## 2021-12-24 DIAGNOSIS — B351 Tinea unguium: Secondary | ICD-10-CM | POA: Diagnosis not present

## 2021-12-26 DIAGNOSIS — I10 Essential (primary) hypertension: Secondary | ICD-10-CM | POA: Diagnosis not present

## 2021-12-26 DIAGNOSIS — M1990 Unspecified osteoarthritis, unspecified site: Secondary | ICD-10-CM | POA: Diagnosis not present

## 2021-12-26 DIAGNOSIS — E559 Vitamin D deficiency, unspecified: Secondary | ICD-10-CM | POA: Diagnosis not present

## 2021-12-26 DIAGNOSIS — E1142 Type 2 diabetes mellitus with diabetic polyneuropathy: Secondary | ICD-10-CM | POA: Diagnosis not present

## 2021-12-26 DIAGNOSIS — F339 Major depressive disorder, recurrent, unspecified: Secondary | ICD-10-CM | POA: Diagnosis not present

## 2021-12-26 DIAGNOSIS — N189 Chronic kidney disease, unspecified: Secondary | ICD-10-CM | POA: Diagnosis not present

## 2022-01-12 DIAGNOSIS — R300 Dysuria: Secondary | ICD-10-CM | POA: Diagnosis not present

## 2022-01-13 DIAGNOSIS — N390 Urinary tract infection, site not specified: Secondary | ICD-10-CM | POA: Diagnosis not present

## 2022-01-15 DIAGNOSIS — N390 Urinary tract infection, site not specified: Secondary | ICD-10-CM | POA: Diagnosis not present

## 2022-01-16 DIAGNOSIS — E1142 Type 2 diabetes mellitus with diabetic polyneuropathy: Secondary | ICD-10-CM | POA: Diagnosis not present

## 2022-01-16 DIAGNOSIS — G4720 Circadian rhythm sleep disorder, unspecified type: Secondary | ICD-10-CM | POA: Diagnosis not present

## 2022-01-16 DIAGNOSIS — F329 Major depressive disorder, single episode, unspecified: Secondary | ICD-10-CM | POA: Diagnosis not present

## 2022-01-16 DIAGNOSIS — I5032 Chronic diastolic (congestive) heart failure: Secondary | ICD-10-CM | POA: Diagnosis not present

## 2022-01-16 DIAGNOSIS — M6281 Muscle weakness (generalized): Secondary | ICD-10-CM | POA: Diagnosis not present

## 2022-01-16 DIAGNOSIS — N390 Urinary tract infection, site not specified: Secondary | ICD-10-CM | POA: Diagnosis not present

## 2022-01-16 DIAGNOSIS — G8929 Other chronic pain: Secondary | ICD-10-CM | POA: Diagnosis not present

## 2022-01-23 DIAGNOSIS — J189 Pneumonia, unspecified organism: Secondary | ICD-10-CM | POA: Diagnosis not present

## 2022-01-27 DIAGNOSIS — F329 Major depressive disorder, single episode, unspecified: Secondary | ICD-10-CM | POA: Diagnosis not present

## 2022-01-27 DIAGNOSIS — E119 Type 2 diabetes mellitus without complications: Secondary | ICD-10-CM | POA: Diagnosis not present

## 2022-01-27 DIAGNOSIS — N189 Chronic kidney disease, unspecified: Secondary | ICD-10-CM | POA: Diagnosis not present

## 2022-01-27 DIAGNOSIS — I10 Essential (primary) hypertension: Secondary | ICD-10-CM | POA: Diagnosis not present

## 2022-01-27 DIAGNOSIS — E559 Vitamin D deficiency, unspecified: Secondary | ICD-10-CM | POA: Diagnosis not present

## 2022-01-27 DIAGNOSIS — M1990 Unspecified osteoarthritis, unspecified site: Secondary | ICD-10-CM | POA: Diagnosis not present

## 2022-01-30 DIAGNOSIS — E119 Type 2 diabetes mellitus without complications: Secondary | ICD-10-CM | POA: Diagnosis not present

## 2022-01-31 DIAGNOSIS — I5040 Unspecified combined systolic (congestive) and diastolic (congestive) heart failure: Secondary | ICD-10-CM | POA: Diagnosis not present

## 2022-01-31 DIAGNOSIS — I10 Essential (primary) hypertension: Secondary | ICD-10-CM | POA: Diagnosis not present

## 2022-02-03 DIAGNOSIS — R600 Localized edema: Secondary | ICD-10-CM | POA: Diagnosis not present

## 2022-02-03 DIAGNOSIS — G4700 Insomnia, unspecified: Secondary | ICD-10-CM | POA: Diagnosis not present

## 2022-02-03 DIAGNOSIS — M1990 Unspecified osteoarthritis, unspecified site: Secondary | ICD-10-CM | POA: Diagnosis not present

## 2022-02-03 DIAGNOSIS — N189 Chronic kidney disease, unspecified: Secondary | ICD-10-CM | POA: Diagnosis not present

## 2022-02-03 DIAGNOSIS — N390 Urinary tract infection, site not specified: Secondary | ICD-10-CM | POA: Diagnosis not present

## 2022-02-03 DIAGNOSIS — E119 Type 2 diabetes mellitus without complications: Secondary | ICD-10-CM | POA: Diagnosis not present

## 2022-02-03 DIAGNOSIS — F32A Depression, unspecified: Secondary | ICD-10-CM | POA: Diagnosis not present

## 2022-02-03 DIAGNOSIS — I10 Essential (primary) hypertension: Secondary | ICD-10-CM | POA: Diagnosis not present

## 2022-02-03 DIAGNOSIS — R5381 Other malaise: Secondary | ICD-10-CM | POA: Diagnosis not present

## 2022-02-03 DIAGNOSIS — I4891 Unspecified atrial fibrillation: Secondary | ICD-10-CM | POA: Diagnosis not present

## 2022-02-06 DIAGNOSIS — M6281 Muscle weakness (generalized): Secondary | ICD-10-CM | POA: Diagnosis not present

## 2022-02-06 DIAGNOSIS — Z9181 History of falling: Secondary | ICD-10-CM | POA: Diagnosis not present

## 2022-02-06 DIAGNOSIS — Z741 Need for assistance with personal care: Secondary | ICD-10-CM | POA: Diagnosis not present

## 2022-02-07 DIAGNOSIS — I10 Essential (primary) hypertension: Secondary | ICD-10-CM | POA: Diagnosis not present

## 2022-02-07 DIAGNOSIS — I5040 Unspecified combined systolic (congestive) and diastolic (congestive) heart failure: Secondary | ICD-10-CM | POA: Diagnosis not present

## 2022-02-13 DIAGNOSIS — N189 Chronic kidney disease, unspecified: Secondary | ICD-10-CM | POA: Diagnosis not present

## 2022-02-13 DIAGNOSIS — M1990 Unspecified osteoarthritis, unspecified site: Secondary | ICD-10-CM | POA: Diagnosis not present

## 2022-02-13 DIAGNOSIS — R600 Localized edema: Secondary | ICD-10-CM | POA: Diagnosis not present

## 2022-02-13 DIAGNOSIS — R0989 Other specified symptoms and signs involving the circulatory and respiratory systems: Secondary | ICD-10-CM | POA: Diagnosis not present

## 2022-02-13 DIAGNOSIS — E119 Type 2 diabetes mellitus without complications: Secondary | ICD-10-CM | POA: Diagnosis not present

## 2022-02-13 DIAGNOSIS — I10 Essential (primary) hypertension: Secondary | ICD-10-CM | POA: Diagnosis not present

## 2022-02-13 DIAGNOSIS — G4700 Insomnia, unspecified: Secondary | ICD-10-CM | POA: Diagnosis not present

## 2022-02-13 DIAGNOSIS — I4891 Unspecified atrial fibrillation: Secondary | ICD-10-CM | POA: Diagnosis not present

## 2022-02-13 DIAGNOSIS — R5381 Other malaise: Secondary | ICD-10-CM | POA: Diagnosis not present

## 2022-02-13 DIAGNOSIS — K219 Gastro-esophageal reflux disease without esophagitis: Secondary | ICD-10-CM | POA: Diagnosis not present

## 2022-02-13 DIAGNOSIS — N390 Urinary tract infection, site not specified: Secondary | ICD-10-CM | POA: Diagnosis not present

## 2022-02-13 DIAGNOSIS — E559 Vitamin D deficiency, unspecified: Secondary | ICD-10-CM | POA: Diagnosis not present

## 2022-02-14 DIAGNOSIS — G4720 Circadian rhythm sleep disorder, unspecified type: Secondary | ICD-10-CM | POA: Diagnosis not present

## 2022-02-14 DIAGNOSIS — F329 Major depressive disorder, single episode, unspecified: Secondary | ICD-10-CM | POA: Diagnosis not present

## 2022-02-14 DIAGNOSIS — G8929 Other chronic pain: Secondary | ICD-10-CM | POA: Diagnosis not present

## 2022-02-17 DIAGNOSIS — K429 Umbilical hernia without obstruction or gangrene: Secondary | ICD-10-CM | POA: Diagnosis not present

## 2022-02-17 DIAGNOSIS — G4700 Insomnia, unspecified: Secondary | ICD-10-CM | POA: Diagnosis not present

## 2022-02-17 DIAGNOSIS — D508 Other iron deficiency anemias: Secondary | ICD-10-CM | POA: Diagnosis not present

## 2022-02-17 DIAGNOSIS — N390 Urinary tract infection, site not specified: Secondary | ICD-10-CM | POA: Diagnosis not present

## 2022-02-17 DIAGNOSIS — I10 Essential (primary) hypertension: Secondary | ICD-10-CM | POA: Diagnosis not present

## 2022-02-17 DIAGNOSIS — I4891 Unspecified atrial fibrillation: Secondary | ICD-10-CM | POA: Diagnosis not present

## 2022-02-17 DIAGNOSIS — G2581 Restless legs syndrome: Secondary | ICD-10-CM | POA: Diagnosis not present

## 2022-02-17 DIAGNOSIS — N189 Chronic kidney disease, unspecified: Secondary | ICD-10-CM | POA: Diagnosis not present

## 2022-02-17 DIAGNOSIS — R5381 Other malaise: Secondary | ICD-10-CM | POA: Diagnosis not present

## 2022-02-17 DIAGNOSIS — I739 Peripheral vascular disease, unspecified: Secondary | ICD-10-CM | POA: Diagnosis not present

## 2022-02-17 DIAGNOSIS — M1990 Unspecified osteoarthritis, unspecified site: Secondary | ICD-10-CM | POA: Diagnosis not present

## 2022-02-17 DIAGNOSIS — E119 Type 2 diabetes mellitus without complications: Secondary | ICD-10-CM | POA: Diagnosis not present

## 2022-02-18 DIAGNOSIS — E785 Hyperlipidemia, unspecified: Secondary | ICD-10-CM | POA: Diagnosis not present

## 2022-02-26 DIAGNOSIS — I4891 Unspecified atrial fibrillation: Secondary | ICD-10-CM | POA: Diagnosis not present

## 2022-02-26 DIAGNOSIS — E559 Vitamin D deficiency, unspecified: Secondary | ICD-10-CM | POA: Diagnosis not present

## 2022-02-26 DIAGNOSIS — F339 Major depressive disorder, recurrent, unspecified: Secondary | ICD-10-CM | POA: Diagnosis not present

## 2022-02-26 DIAGNOSIS — I10 Essential (primary) hypertension: Secondary | ICD-10-CM | POA: Diagnosis not present

## 2022-02-26 DIAGNOSIS — E119 Type 2 diabetes mellitus without complications: Secondary | ICD-10-CM | POA: Diagnosis not present

## 2022-02-26 DIAGNOSIS — N189 Chronic kidney disease, unspecified: Secondary | ICD-10-CM | POA: Diagnosis not present

## 2022-03-01 DIAGNOSIS — I5040 Unspecified combined systolic (congestive) and diastolic (congestive) heart failure: Secondary | ICD-10-CM | POA: Diagnosis not present

## 2022-03-05 DIAGNOSIS — Z9181 History of falling: Secondary | ICD-10-CM | POA: Diagnosis not present

## 2022-03-05 DIAGNOSIS — Z741 Need for assistance with personal care: Secondary | ICD-10-CM | POA: Diagnosis not present

## 2022-03-05 DIAGNOSIS — M6281 Muscle weakness (generalized): Secondary | ICD-10-CM | POA: Diagnosis not present

## 2022-03-06 DIAGNOSIS — B351 Tinea unguium: Secondary | ICD-10-CM | POA: Diagnosis not present

## 2022-03-06 DIAGNOSIS — E1159 Type 2 diabetes mellitus with other circulatory complications: Secondary | ICD-10-CM | POA: Diagnosis not present

## 2022-03-10 ENCOUNTER — Ambulatory Visit: Payer: Self-pay | Admitting: Surgery

## 2022-03-10 ENCOUNTER — Telehealth: Payer: Self-pay

## 2022-03-10 DIAGNOSIS — K429 Umbilical hernia without obstruction or gangrene: Secondary | ICD-10-CM | POA: Diagnosis not present

## 2022-03-10 IMAGING — CT CT HEAD W/O CM
4 series · 16 of 47 positions shown, 18 images · non-contrast
Comparison: None.

CLINICAL DATA: Head trauma due to a fall, swelling to back side of
head.

EXAM:
CT HEAD WITHOUT CONTRAST
TECHNIQUE: Contiguous axial images were obtained from the base of the skull
through the vertex without intravenous contrast.

[Series 3: head bone · axial · 0.47mm/px · z∈[-170,-138]mm · 3 of 79 slices shown]
[im 8/79  bone]
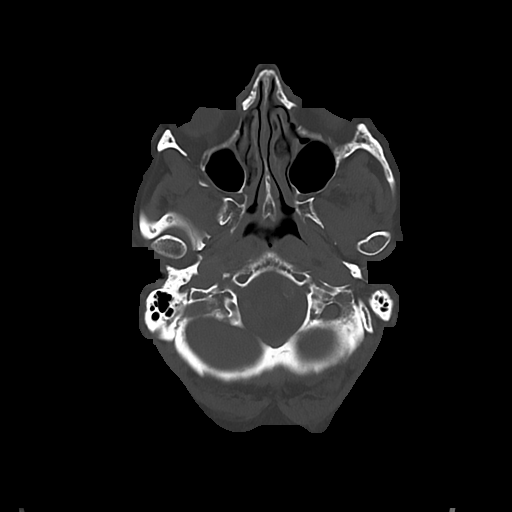
[im 16/79  bone]
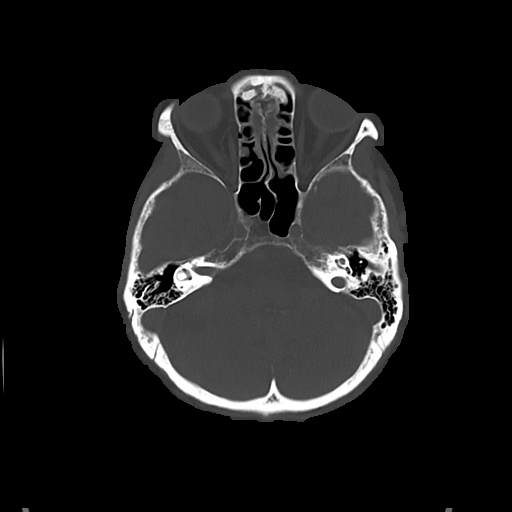
[im 24/79  bone]
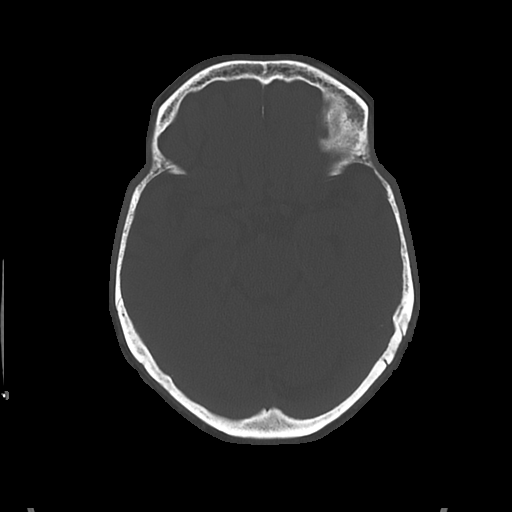

[Series 4: head wo · axial · 0.47mm/px · z∈[-170,-50]mm · 7 of 32 slices shown, 9 images]
[im 4/32  brain]
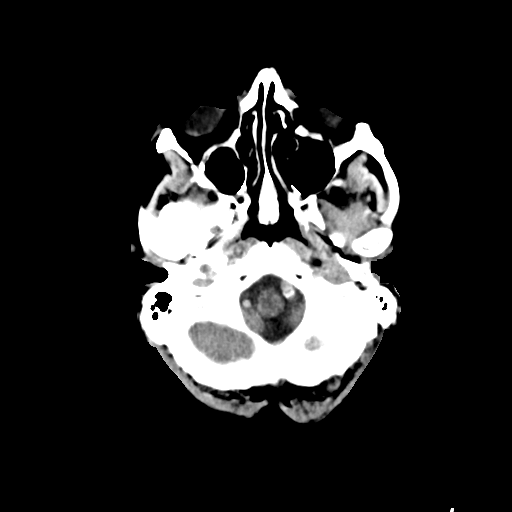
[im 4/32  bone]
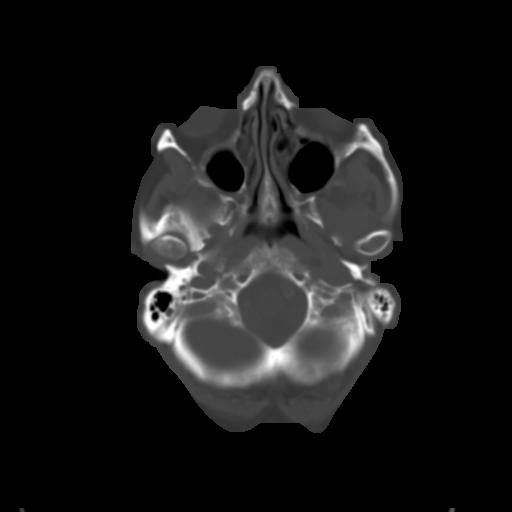
[im 8/32  brain]
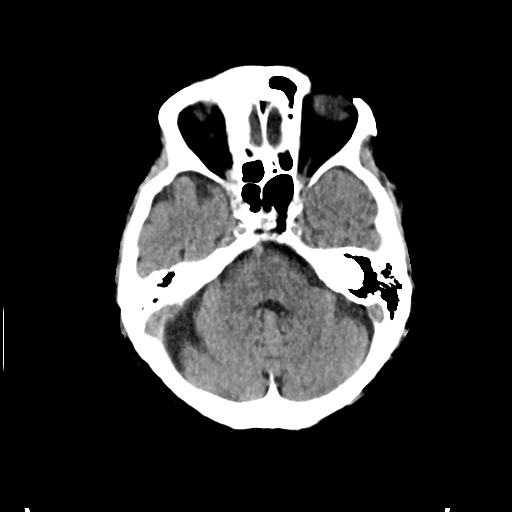
[im 12/32  brain]
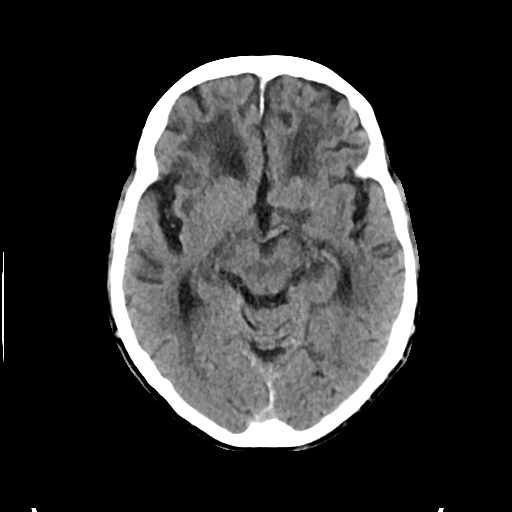
[im 16/32  brain]
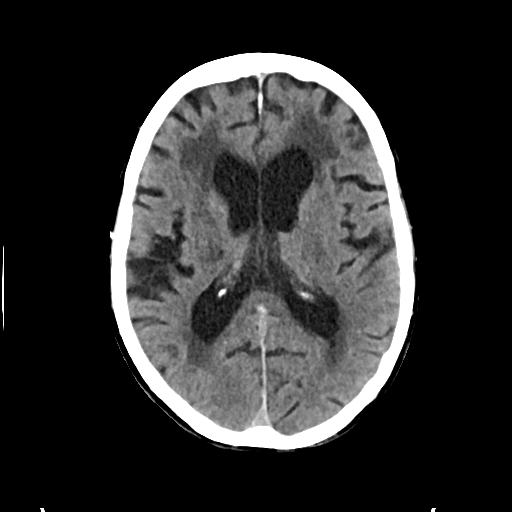
[im 20/32  brain]
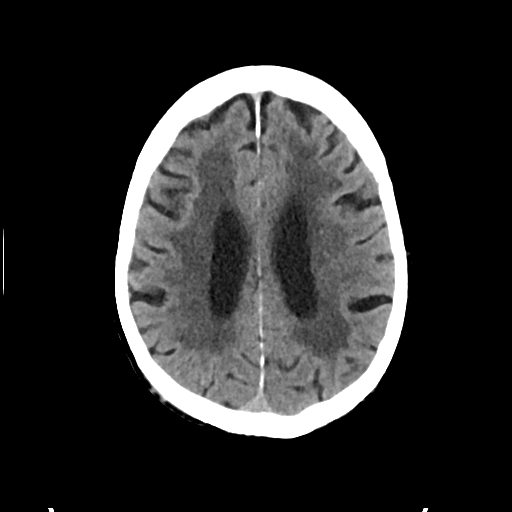
[im 20/32  bone]
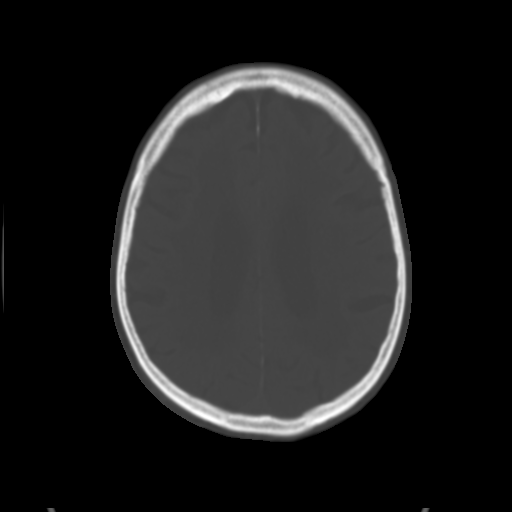
[im 24/32  brain]
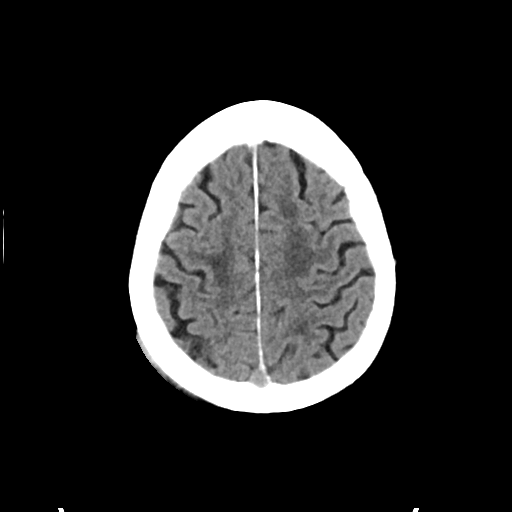
[im 28/32  brain]
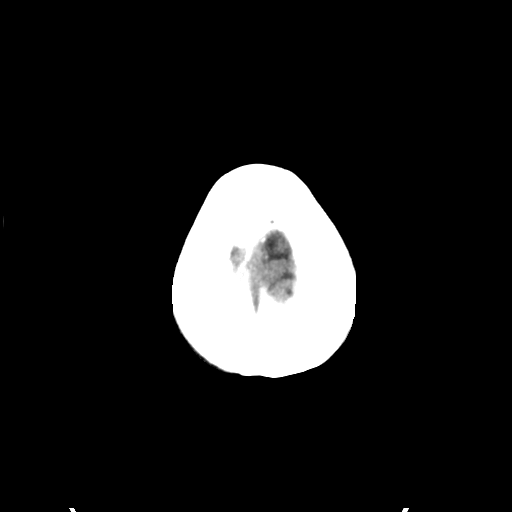

[Series 5: cor soft · coronal · 0.32mm/px · 3 of 68 slices shown]
[im 23/68  brain]
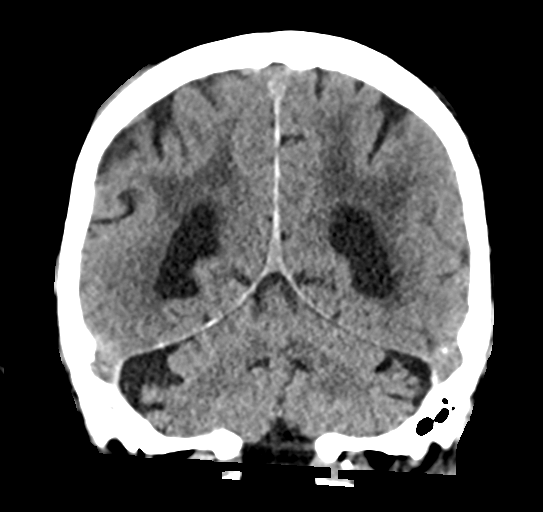
[im 30/68  brain]
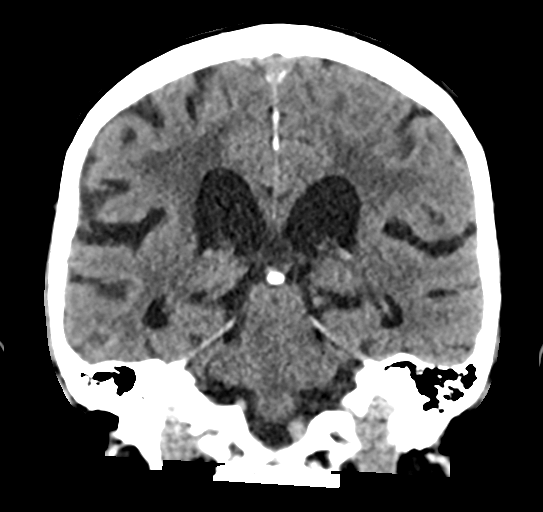
[im 38/68  brain]
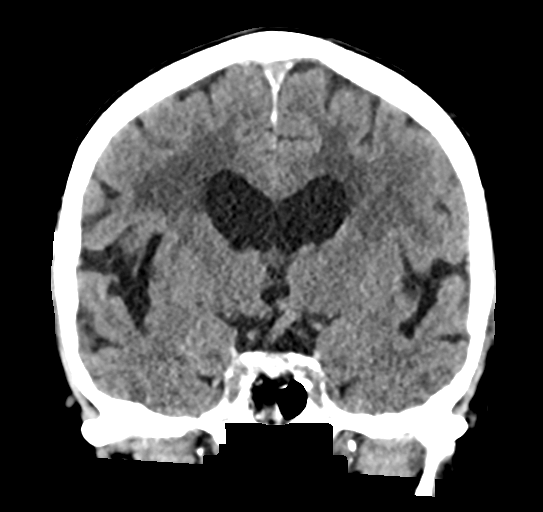

[Series 6: sag soft · sagittal · 0.31mm/px · 3 of 56 slices shown]
[im 19/56  brain]
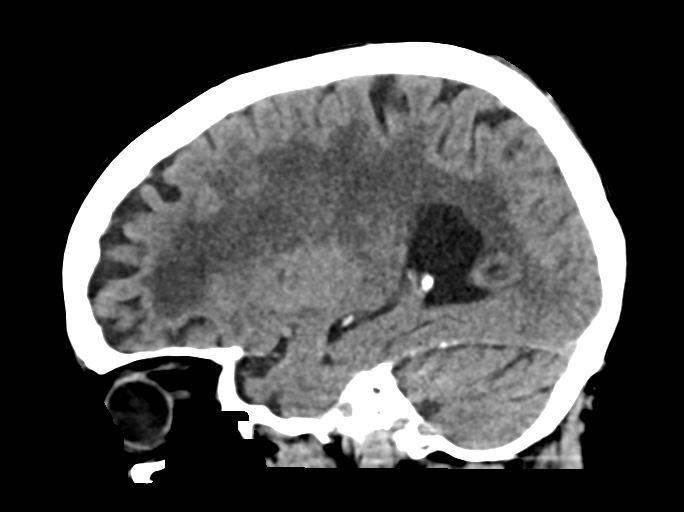
[im 28/56  brain]
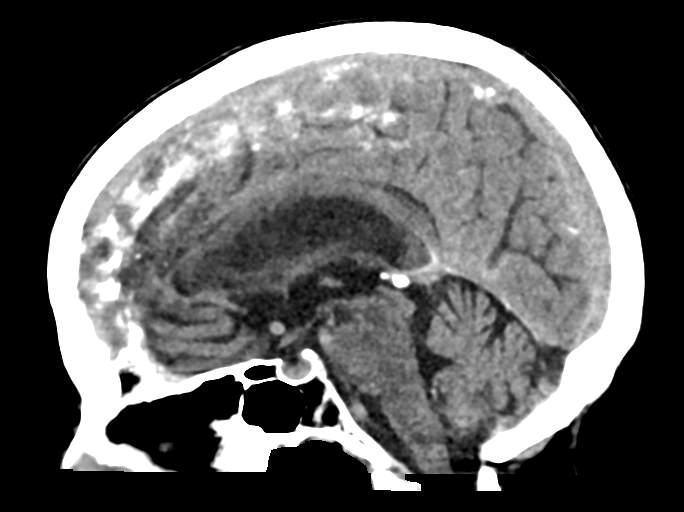
[im 37/56  brain]
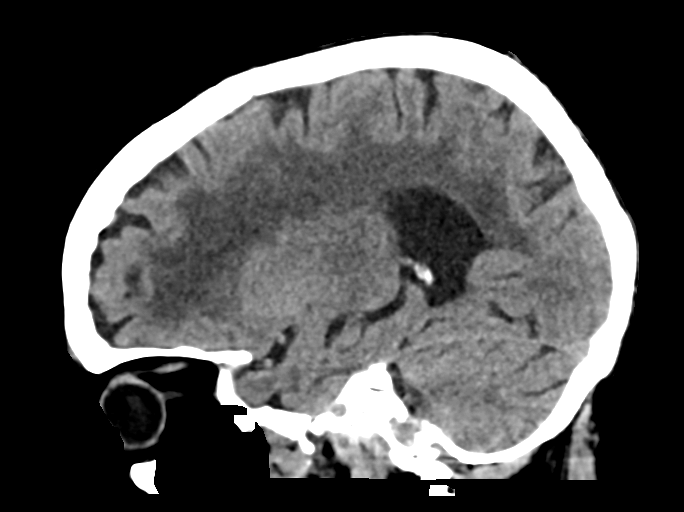

[16 of 47 positions shown; findings below may reference images not displayed]

BRAIN:
BRAIN
Cerebral ventricle sizes are concordant with the degree of cerebral
volume loss. Patchy and confluent areas of decreased attenuation are
noted throughout the deep and periventricular white matter of the
cerebral hemispheres bilaterally, compatible with chronic
microvascular ischemic disease.

No evidence of large-territorial acute infarction. No parenchymal
hemorrhage. No mass lesion. No extra-axial collection.

No mass effect or midline shift. No hydrocephalus. Basilar cisterns
are patent.

Vascular: No hyperdense vessel. Atherosclerotic calcifications are
present within the cavernous internal carotid arteries.

Skull: No acute fracture or focal lesion.

Sinuses/Orbits: Paranasal sinuses and mastoid air cells are clear.
The orbits are unremarkable.

Other: There is a right parietal scalp 7 mm hematoma formation.
IMPRESSION: No acute intracranial abnormality.

## 2022-03-10 IMAGING — DX DG HIP (WITH OR WITHOUT PELVIS) 2-3V*R*
3 series · 3 of 3 positions shown · non-contrast
Comparison: Pelvic radiograph dated 07/14/2017

CLINICAL DATA: Fall and left hip pain.

EXAM:
DG HIP (WITH OR WITHOUT PELVIS) 2-3V RIGHT

[pelvis ap]
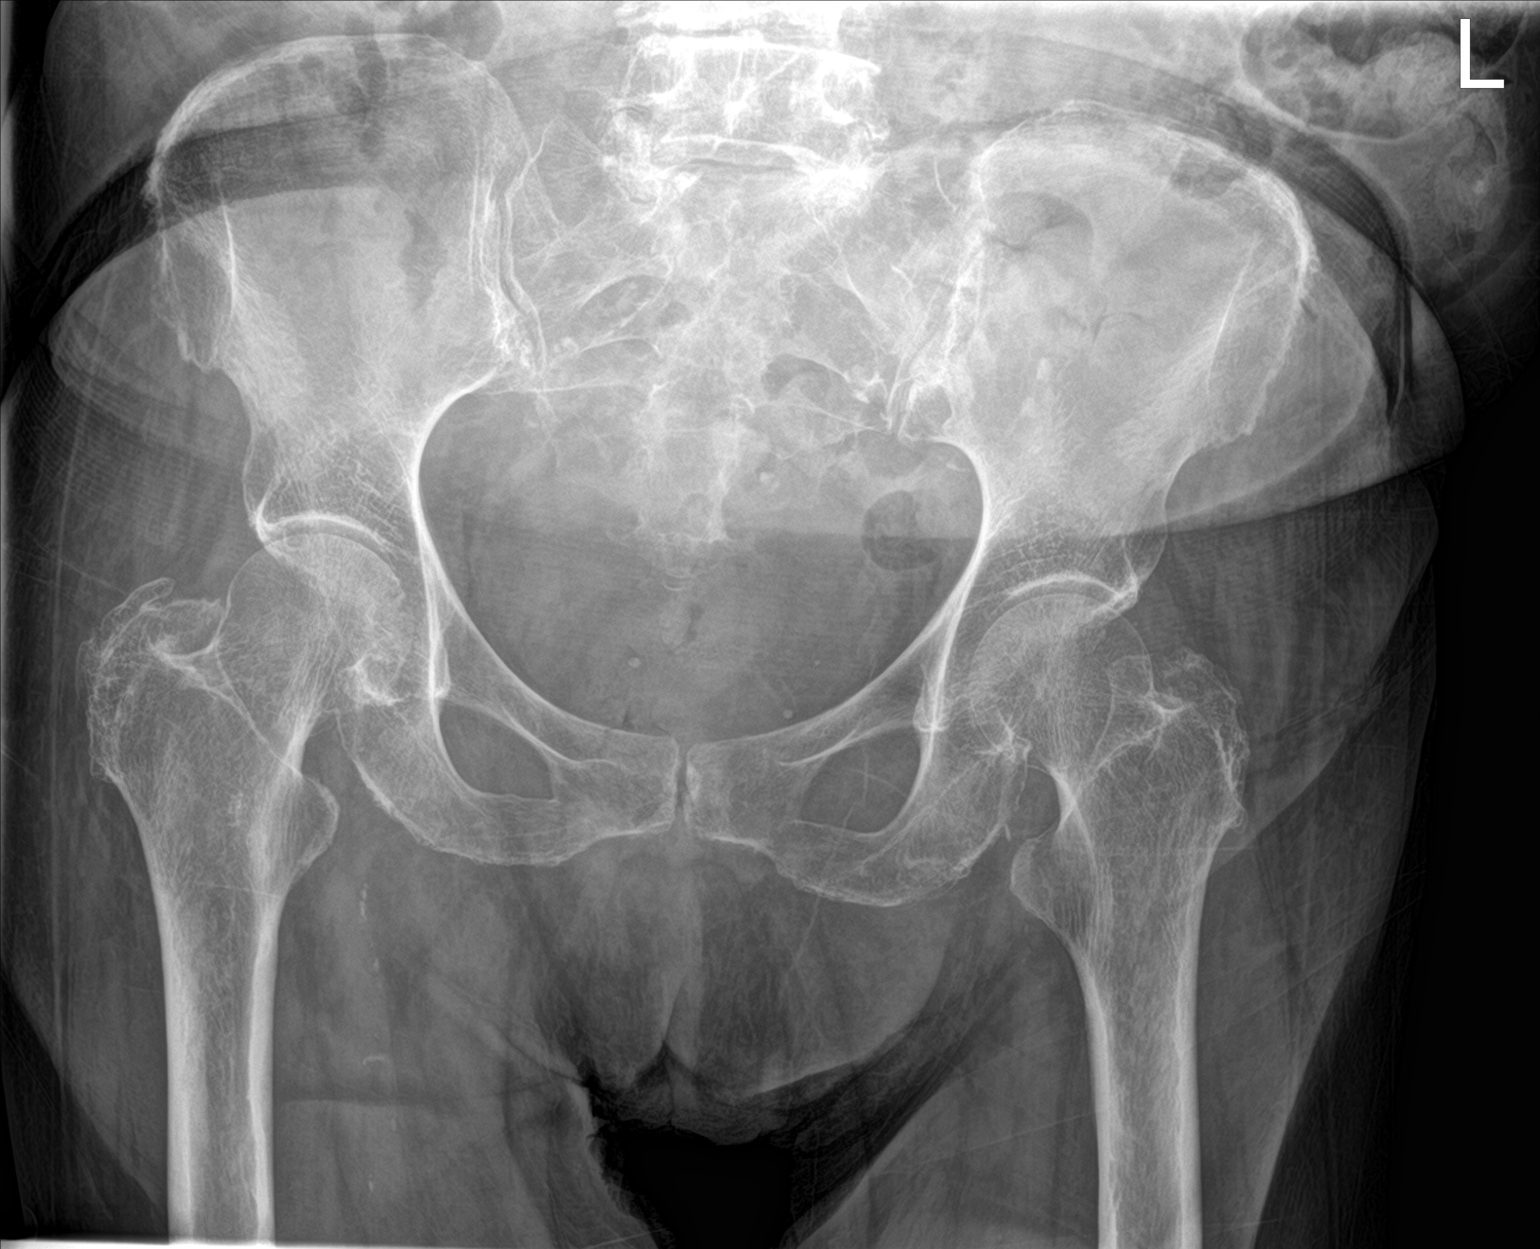

[hip ap]
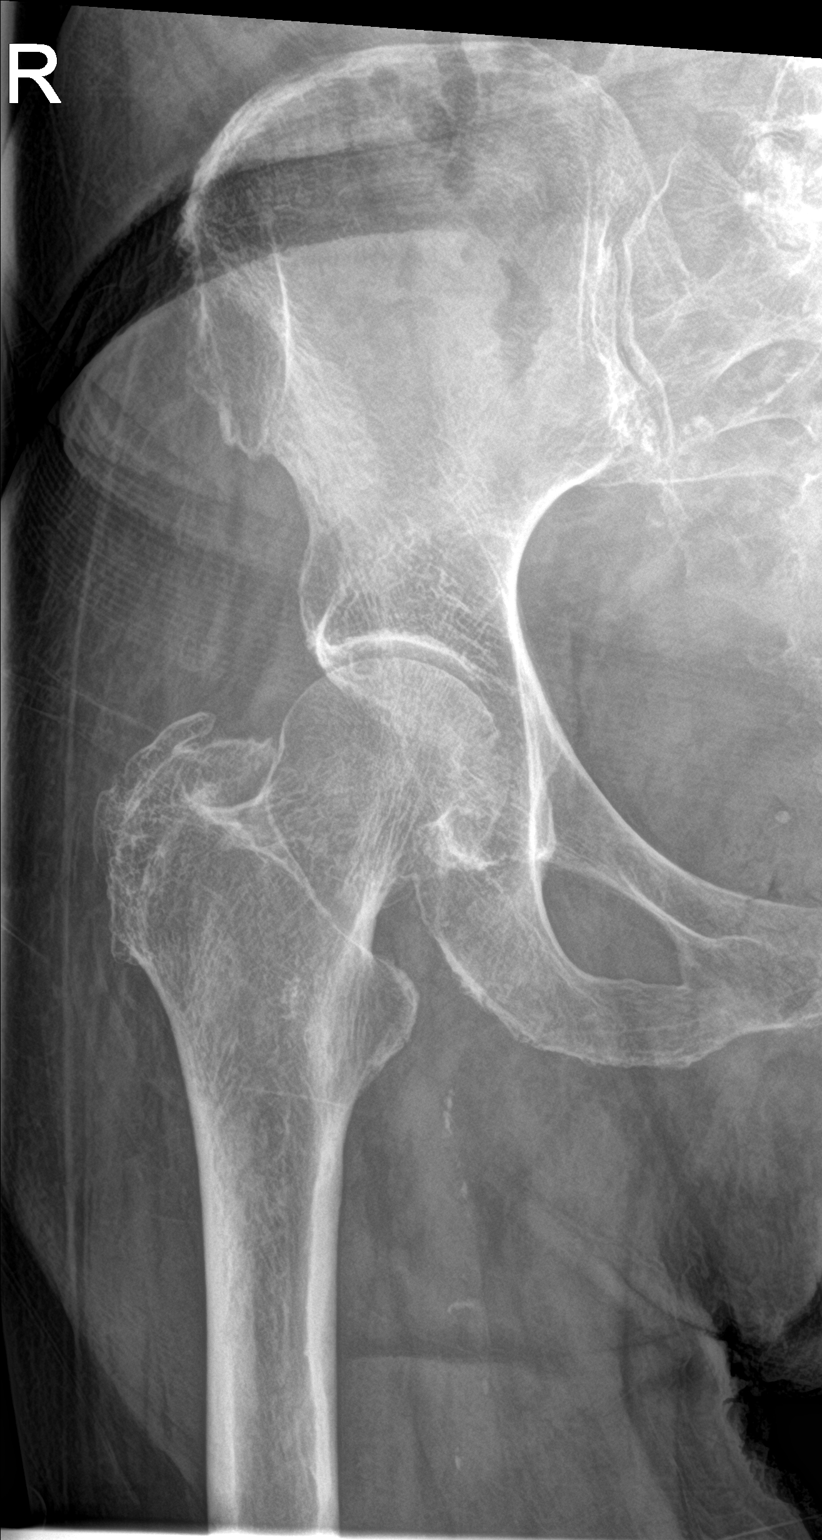

[hip lat]
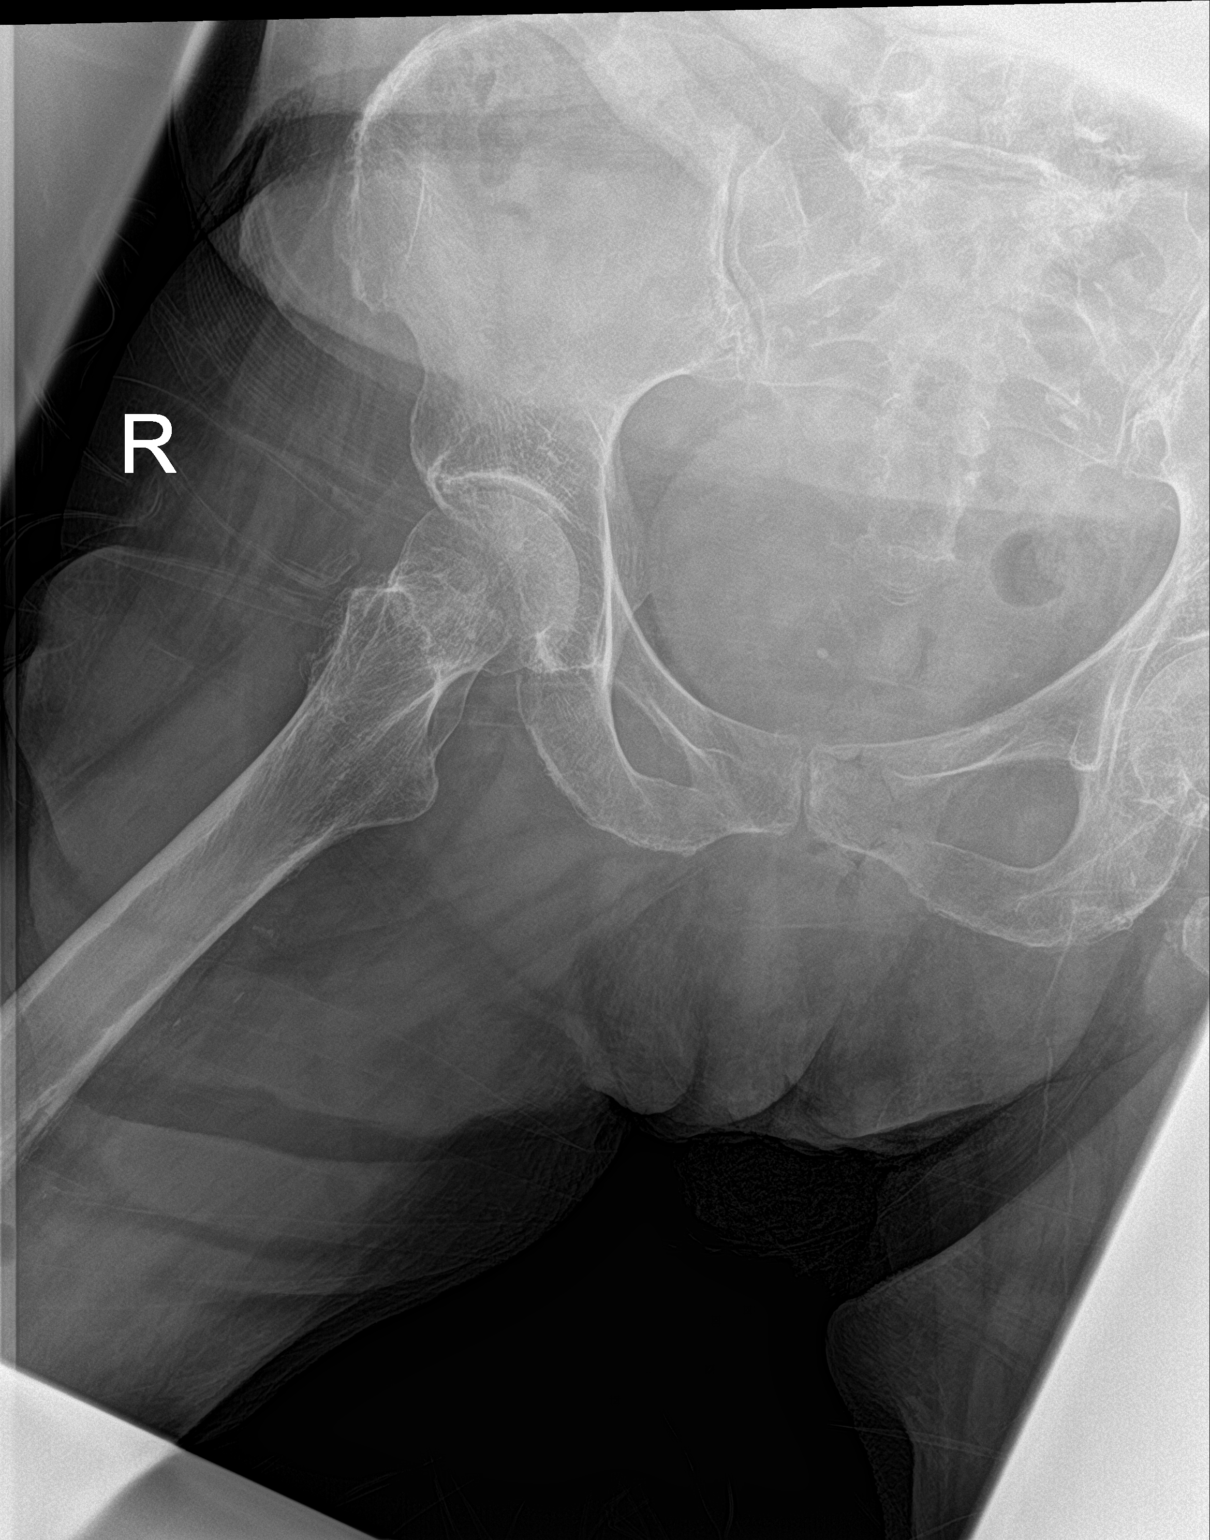

[3 of 3 positions shown; findings below may reference images not displayed]

FINDINGS: There is no acute fracture or dislocation. The bones are osteopenic.
The soft tissues are unremarkable.
IMPRESSION: No acute fracture or dislocation.

## 2022-03-10 NOTE — H&P (Signed)
History of Present Illness: [name] is a 88 y.o. female who is seen today as an office consultation at the request of Dr. Con Memos for evaluation of Umbilical Hernia .   This is a 88 year old female who is a resident of an assisted living facility with a very extensive past medical history who presents with several years of a slowly enlarging umbilical hernia.  The patient usually is in a wheelchair but does ambulate some with a walker.  She had a fall 2 years ago and had some rib fractures.  At that time she had a CT scan that showed a protruding umbilical hernia just above the umbilicus.  She states that this area has become larger and more uncomfortable.  She denies any obstructive symptoms.  She is concerned about the increase in the size of the hernia.  The patient has atrial fibrillation but is no longer on any anticoagulation.  She had a periprosthetic femur fracture earlier in the year and underwent surgery under general anesthesia at [facility].  By all reports, she recovered well.  She is accompanied by a caregiver from her facility.  She does have family in the area that helps her make medical decisions.   Review of Systems: A complete review of systems was obtained from the patient.  I have reviewed this information and discussed as appropriate with the patient.  See HPI as well for other ROS.  Review of Systems  Constitutional: Negative.   HENT: Negative.    Eyes: Negative.   Respiratory: Negative.    Cardiovascular: Negative.   Gastrointestinal: Negative.   Genitourinary: Negative.   Musculoskeletal: Negative.   Skin: Negative.   Neurological: Negative.   Endo/Heme/Allergies: Negative.   Psychiatric/Behavioral: Negative.        Medical History:   Patient Active Problem List  Diagnosis   Aortic atherosclerosis (CMS-HCC)   Cerebral infarction, unspecified (CMS-HCC)   Chronic diastolic CHF (congestive heart failure) (CMS-HCC)   Diabetic polyneuropathy associated  with type 2 diabetes mellitus (CMS-HCC)   Depression, recurrent (CMS-HCC)   DOE (dyspnea on exertion)   Esophageal reflux   Fibromyalgia   History of femur fracture   Hyperglycemia due to diabetes mellitus (CMS-HCC)   History of recurrent UTIs   Hyperlipidemia associated with type 2 diabetes mellitus    Hypertension associated with diabetes    Personal history of malignant neoplasm of breast   Permanent atrial fibrillation (CMS-HCC)   Subarachnoid hemorrhage following injury (CMS-HCC)   Short-term memory loss   Type 2 diabetes mellitus with diabetic neuropathy, unspecified (CMS-HCC)    Past surgical history: Appendectomy, left breast lumpectomy, cholecystectomy, bilateral knee replacements, tonsillectomy,  Allergies  Allergen Reactions   Diltiazem Hcl Itching, Other (See Comments) and Rash    Red itchy rash started a few hours after taking short acting '120mg'$  dilt tabs  Red itchy rash started a few hours after taking short acting '120mg'$  dilt tabs  Red itchy rash started a few hours after taking short acting '120mg'$  dilt tabs  Red itchy rash started a few hours after taking short acting '120mg'$  dilt tabs  Red itchy rash started a few hours after taking short acting '120mg'$  dilt tabs  Red itchy rash started a few hours after taking short acting '120mg'$  dilt tabs   Ace Inhibitors Cough    Current Outpatient Medications on File Prior to Visit  Medication Sig Dispense Refill   celecoxib (CELEBREX) 100 MG capsule Take by mouth     estradioL (ESTRACE) 0.01 % (  0.1 mg/gram) vaginal cream Place vaginally     hydrALAZINE (APRESOLINE) 25 MG tablet Take by mouth     HYDROcodone-acetaminophen (NORCO) 5-325 mg tablet Take by mouth     metFORMIN (GLUCOPHAGE) 500 MG tablet      methocarbamoL (ROBAXIN) 500 MG tablet Take by mouth every 12 (twelve) hours as needed     omeprazole (PRILOSEC) 20 MG DR capsule      ondansetron (ZOFRAN-ODT) 4 MG disintegrating tablet      potassium chloride (MICRO-K) 10  MEQ ER capsule      rOPINIRole (REQUIP) 0.25 MG tablet Take by mouth     acetaminophen 325 mg Cap Take by mouth every 6 (six) hours as needed     atorvastatin (LIPITOR) 80 MG tablet Take by mouth     cloNIDine HCL (CATAPRES) 0.1 MG tablet Take by mouth     cranberry 400 mg Cap Take 400 mg by mouth 2 (two) times daily     hydroCHLOROthiazide (HYDRODIURIL) 25 MG tablet Take by mouth     magnesium 250 mg Tab Take by mouth     No current facility-administered medications on file prior to visit.    Family History  Problem Relation Age of Onset   High blood pressure (Hypertension) Mother    Stroke Brother      Social History   Tobacco Use  Smoking Status Never  Smokeless Tobacco Never     Social History   Socioeconomic History   Marital status: Widowed  Tobacco Use   Smoking status: Never   Smokeless tobacco: Never  Substance and Sexual Activity   Alcohol use: Not Currently   Drug use: Never    Objective:    Vitals:   03/10/22 0927  BP: (!) 140/90  Pulse: 72  Temp: 36.7 C (98 F)  SpO2: 96%  Height: 149.9 cm ('4\' 11"'$ )    There is no height or weight on file to calculate BMI.  Physical Exam   Constitutional:  WDWN in NAD, conversant, no obvious deformities; lying in bed comfortably Eyes:  Pupils equal, round; sclera anicteric; moist conjunctiva; no lid lag HENT:  Oral mucosa moist; good dentition  Neck:  No masses palpated, trachea midline; no thyromegaly Lungs:  CTA bilaterally; normal respiratory effort CV:  Regular rate and rhythm; no murmurs; extremities well-perfused with no edema Abd:  +bowel sounds, soft, non-tender, no palpable organomegaly; protruding umbilical hernia that is partially reducible.  The hernia sac measures approximately 4 cm in diameter.  The defect is approximately 2 cm. Musc:  Unable to assess gait; wheelchair-bound, no apparent clubbing or cyanosis in extremities Lymphatic:  No palpable cervical or axillary lymphadenopathy Skin:  Warm,  dry; no sign of jaundice Psychiatric - alert and oriented x 4; calm mood and affect    Assessment and Plan:  Diagnoses and all orders for this visit:  Umbilical hernia without obstruction or gangrene    Although the patient has multiple comorbidities and is obviously at slightly higher risk for perioperative complications due to her age and medical comorbidities, the hernia seems to be larger and more symptomatic.  We discussed the risk of incarceration or strangulation of the small bowel.  The patient tolerated urgent surgery well earlier this year.  I recommended that she undergo elective repair of this hernia to prevent incarceration or strangulation.  We will obtain cardiac clearance.  She will discuss this with her family.  She will let us know her decision.  I discussed the procedure with the  patient.  Recommend umbilical hernia repair, probably with mesh.  The surgical procedure has been discussed with the patient.  Potential risks, benefits, alternative treatments, and expected outcomes have been explained.  All of the patient's questions at this time have been answered.  The likelihood of reaching the patient's treatment goal is good.     River Mckercher Jearld Adjutant, MD  03/10/2022 10:32 AM

## 2022-03-10 NOTE — Telephone Encounter (Signed)
Contacted the patient who is in a facility and spoke with the nurse scheduled the telehealth visit.   Consent given but meds not reviewed. The nurse stated she did not have time to go over the aptietns medication because she takes so much. She states she will fax over a copy of the meds and I can call her back with any questions.   Awaiting the med list.

## 2022-03-10 NOTE — Telephone Encounter (Signed)
MED LIST RECEIVED AND MEDICATIONS REVIEWED AND UPDATED.

## 2022-03-10 NOTE — Telephone Encounter (Signed)
   Name: [name]  DOB: March 16, 1926  MRN: [id number]  Primary Cardiologist: Peter Martinique, MD   Preoperative team, please contact this patient and set up a phone call appointment for further preoperative risk assessment. Please obtain consent and complete medication review. Thank you for your help.  I confirm that guidance regarding antiplatelet and oral anticoagulation therapy has been completed and, if necessary, noted below.  No medications to be held prior to surgery.   Deberah Pelton, NP 03/10/2022, 1:53 PM [place]

## 2022-03-10 NOTE — Telephone Encounter (Signed)
   Pre-operative Risk Assessment    Patient Name: [name]  DOB: 09/26/1925 MRN: [id number]      Request for Surgical Clearance    Procedure:   Hernia Surgery  Date of Surgery:  Clearance TBD                                 Surgeon:  Dr Donnie Mesa Surgeon's Group or Practice Name:  [facility] Phone number:  [phone] Fax number:  [phone] Mammie Lorenzo, LPN)   Type of Clearance Requested:   - Medical    Type of Anesthesia:   LMA   Additional requests/questions:  N/A  Signed, Ulice Brilliant T   03/10/2022, 1:32 PM

## 2022-03-10 NOTE — Telephone Encounter (Signed)
  Patient Consent for Virtual Visit         [name] has provided verbal consent on 03/10/2022 for a virtual visit (video or telephone).   CONSENT FOR VIRTUAL VISIT FOR:  [name]  By participating in this virtual visit I agree to the following:  I hereby voluntarily request, consent and authorize [facility] and its employed or contracted physicians, physician assistants, nurse practitioners or other licensed health care professionals (the Practitioner), to provide me with telemedicine health care services (the "Services") as deemed necessary by the treating Practitioner. I acknowledge and consent to receive the Services by the Practitioner via telemedicine. I understand that the telemedicine visit will involve communicating with the Practitioner through live audiovisual communication technology and the disclosure of certain medical information by electronic transmission. I acknowledge that I have been given the opportunity to request an in-person assessment or other available alternative prior to the telemedicine visit and am voluntarily participating in the telemedicine visit.  I understand that I have the right to withhold or withdraw my consent to the use of telemedicine in the course of my care at any time, without affecting my right to future care or treatment, and that the Practitioner or I may terminate the telemedicine visit at any time. I understand that I have the right to inspect all information obtained and/or recorded in the course of the telemedicine visit and may receive copies of available information for a reasonable fee.  I understand that some of the potential risks of receiving the Services via telemedicine include:  Delay or interruption in medical evaluation due to technological equipment failure or disruption; Information transmitted may not be sufficient (e.g. poor resolution of images) to allow for appropriate medical decision making by the  Practitioner; and/or  In rare instances, security protocols could fail, causing a breach of personal health information.  Furthermore, I acknowledge that it is my responsibility to provide information about my medical history, conditions and care that is complete and accurate to the best of my ability. I acknowledge that Practitioner's advice, recommendations, and/or decision may be based on factors not within their control, such as incomplete or inaccurate data provided by me or distortions of diagnostic images or specimens that may result from electronic transmissions. I understand that the practice of medicine is not an exact science and that Practitioner makes no warranties or guarantees regarding treatment outcomes. I acknowledge that a copy of this consent can be made available to me via my patient portal ([facility]), or I can request a printed copy by calling the office of [facility].    I understand that my insurance will be billed for this visit.   I have read or had this consent read to me. I understand the contents of this consent, which adequately explains the benefits and risks of the Services being provided via telemedicine.  I have been provided ample opportunity to ask questions regarding this consent and the Services and have had my questions answered to my satisfaction. I give my informed consent for the services to be provided through the use of telemedicine in my medical care

## 2022-03-11 DIAGNOSIS — I700 Atherosclerosis of aorta: Secondary | ICD-10-CM | POA: Diagnosis not present

## 2022-03-11 DIAGNOSIS — K429 Umbilical hernia without obstruction or gangrene: Secondary | ICD-10-CM | POA: Diagnosis not present

## 2022-03-13 DIAGNOSIS — R5381 Other malaise: Secondary | ICD-10-CM | POA: Diagnosis not present

## 2022-03-13 DIAGNOSIS — N189 Chronic kidney disease, unspecified: Secondary | ICD-10-CM | POA: Diagnosis not present

## 2022-03-13 DIAGNOSIS — K5900 Constipation, unspecified: Secondary | ICD-10-CM | POA: Diagnosis not present

## 2022-03-13 DIAGNOSIS — M1990 Unspecified osteoarthritis, unspecified site: Secondary | ICD-10-CM | POA: Diagnosis not present

## 2022-03-13 DIAGNOSIS — G4700 Insomnia, unspecified: Secondary | ICD-10-CM | POA: Diagnosis not present

## 2022-03-13 DIAGNOSIS — N390 Urinary tract infection, site not specified: Secondary | ICD-10-CM | POA: Diagnosis not present

## 2022-03-13 DIAGNOSIS — E559 Vitamin D deficiency, unspecified: Secondary | ICD-10-CM | POA: Diagnosis not present

## 2022-03-13 DIAGNOSIS — K219 Gastro-esophageal reflux disease without esophagitis: Secondary | ICD-10-CM | POA: Diagnosis not present

## 2022-03-17 DIAGNOSIS — N390 Urinary tract infection, site not specified: Secondary | ICD-10-CM | POA: Diagnosis not present

## 2022-03-17 DIAGNOSIS — G4720 Circadian rhythm sleep disorder, unspecified type: Secondary | ICD-10-CM | POA: Diagnosis not present

## 2022-03-17 DIAGNOSIS — G4700 Insomnia, unspecified: Secondary | ICD-10-CM | POA: Diagnosis not present

## 2022-03-17 DIAGNOSIS — N189 Chronic kidney disease, unspecified: Secondary | ICD-10-CM | POA: Diagnosis not present

## 2022-03-17 DIAGNOSIS — G8929 Other chronic pain: Secondary | ICD-10-CM | POA: Diagnosis not present

## 2022-03-17 DIAGNOSIS — F329 Major depressive disorder, single episode, unspecified: Secondary | ICD-10-CM | POA: Diagnosis not present

## 2022-03-17 DIAGNOSIS — K219 Gastro-esophageal reflux disease without esophagitis: Secondary | ICD-10-CM | POA: Diagnosis not present

## 2022-03-17 DIAGNOSIS — R5381 Other malaise: Secondary | ICD-10-CM | POA: Diagnosis not present

## 2022-03-17 DIAGNOSIS — E559 Vitamin D deficiency, unspecified: Secondary | ICD-10-CM | POA: Diagnosis not present

## 2022-03-17 DIAGNOSIS — K429 Umbilical hernia without obstruction or gangrene: Secondary | ICD-10-CM | POA: Diagnosis not present

## 2022-03-17 DIAGNOSIS — E119 Type 2 diabetes mellitus without complications: Secondary | ICD-10-CM | POA: Diagnosis not present

## 2022-03-17 DIAGNOSIS — R040 Epistaxis: Secondary | ICD-10-CM | POA: Diagnosis not present

## 2022-03-18 ENCOUNTER — Ambulatory Visit: Payer: PPO

## 2022-03-18 ENCOUNTER — Telehealth: Payer: Self-pay | Admitting: Cardiology

## 2022-03-18 NOTE — Telephone Encounter (Signed)
Pt missed her tele pre op appt today. S/w RN at [facility] and rescheduled the tele pre op appt 03/24/22 @ 2:40. Per RN call [phone] and ask to s/w Miss Delorenzo's nurse. The nurse will get on the phone with the pt for the pre op tele appt.

## 2022-03-18 NOTE — Progress Notes (Unsigned)
Virtual Visit via Telephone Note   Because of [name]'s co-morbid illnesses, she is at least at moderate risk for complications without adequate follow up.  This format is felt to be most appropriate for this patient at this time.  The patient did not have access to video technology/had technical difficulties with video requiring transitioning to audio format only (telephone).  All issues noted in this document were discussed and addressed.  No physical exam could be performed with this format.  Please refer to the patient's chart for her consent to telehealth for [facility].  Evaluation Performed:  Preoperative cardiovascular risk assessment _____________   Date:  03/18/2022   Patient ID:  [name], DOB [birth year], MRN [id number] Patient Location:  Home Provider location:   Office  Primary Care Provider:  Ivy Lynn, NP Primary Cardiologist:  Joan Martinique, MD  Chief Complaint / Patient Profile   88 y.o. y/o female with a h/o CVA, atrial fibrillation (no AC due to age and fall risk), HLD, moderate carotid artery disease, pulmonary fibrosis, diastolic CHF, and breast cancer who is pending hernia surgery and presents today for telephonic preoperative cardiovascular risk assessment.  Past Medical History    Past Medical History:  Diagnosis Date   Arthritis    Atrial fibrillation ([place])    Cancer ([place])    breast   Cataract    Diabetes mellitus without complication ([place])    Frequent UTI    Hyperlipidemia    Hypertension    Neck pain    Scoliosis    Stroke [facility])    Past Surgical History:  Procedure Laterality Date   APPENDECTOMY     BREAST LUMPECTOMY Left    CHOLECYSTECTOMY     JOINT REPLACEMENT     bilat knee    REPLACEMENT TOTAL KNEE BILATERAL Bilateral    TONSILLECTOMY      Allergies  Allergies  Allergen Reactions   Diltiazem Hcl Itching, Other (See Comments) and Rash    Red itchy rash started a few hours after taking short  acting 130m dilt tabs Red itchy rash started a few hours after taking short acting 1262mdilt tabs Red itchy rash started a few hours after taking short acting 12069milt tabs   Duloxetine Hcl Anxiety, Other (See Comments) and Rash    Makes patient feel "restless and scared" and unable to sleep Makes patient feel "restless and scared" and unable to sleep Makes patient feel "restless and scared" and unable to sleep   Ace Inhibitors Cough    History of Present Illness    [name] a [age]o. female who presents via audEngineer, civil (consulting)r a telehealth visit today.  Pt was last seen in cardiology clinic on 08/05/21 by ErnAmbrose PancoastP.  At that time [name] doing well but fatigued easily.  The patient is now pending procedure as outlined above. Since her last visit, she ***   Home Medications    Prior to Admission medications   Medication Sig Start Date End Date Taking? Authorizing Provider  acetaminophen (TYLENOL) 500 MG tablet Take 1 tablet (500 mg total) by mouth every 6 (six) hours as needed for moderate pain. Patient not taking: Reported on 03/10/2022 07/16/19   Arrien, MauJimmy PicketD  Apoaequorin (PRSus[facility] Surgery Center IncTRA STRENGTH) 20 MG CAPS Take 1 capsule by mouth daily. Patient not taking: Reported on 03/10/2022    [provider]  atorvastatin (LIPITOR) 80 MG tablet Take 1 tablet (80  mg total) by mouth at bedtime. Patient not taking: Reported on 03/10/2022 02/21/20   Joan Lynn, NP  blood glucose meter kit and supplies Dispense based on patient and insurance preference. Use up to four times daily as directed. (FOR ICD-10 E10.9, E11.9).accucheck Patient not taking: Reported on 03/10/2022 02/20/20   Joan Lynn, NP  celecoxib (CELEBREX) 100 MG capsule Take 100 mg by mouth 2 (two) times daily.    [provider]  Cholecalciferol (VITAMIN D3) 50 MCG (2000 UT) TABS Take 2,000 Units by mouth in the morning.    [provider]   cloNIDine (CATAPRES) 0.1 MG tablet Take 1 tablet (0.1 mg total) by mouth 2 (two) times daily as needed (for Systolic over 034). 02/21/20   Joan Lynn, NP  COLACE CLEAR 50 MG capsule Take 50 mg by mouth daily at 12 noon. 06/29/21   [provider]  Cranberry 400 MG CAPS Take 400 mg by mouth daily.    [provider]  doxycycline (MONODOX) 100 MG capsule Take 100 mg by mouth 2 (two) times daily.    [provider]  hydrALAZINE (APRESOLINE) 25 MG tablet Take 1 tablet (25 mg total) by mouth 3 (three) times daily. 08/05/21   Joan Lund., NP  hydrochlorothiazide (HYDRODIURIL) 50 MG tablet Take 50 mg by mouth daily. 07/15/21   [provider]  LEVEMIR FLEXTOUCH 100 UNIT/ML FlexTouch Pen Inject into the skin. 9 units in the morning and 6 units at bedtime 07/14/21   [provider]  Magnesium 250 MG TABS Take 1 tablet (250 mg total) by mouth daily. 02/20/20   Joan Lynn, NP  metFORMIN (GLUCOPHAGE) 500 MG tablet Take 1 tablet (500 mg total) by mouth daily with breakfast. 02/20/20   Joan Lynn, NP  methocarbamol (ROBAXIN) 500 MG tablet Take 500 mg by mouth every 12 (twelve) hours as needed for muscle spasms.    [provider]  mirtazapine (REMERON) 15 MG tablet Take 1 tablet by mouth at bedtime. 07/14/21   [provider]  NOVOLOG FLEXPEN 100 UNIT/ML FlexPen Inject 0-10 Units into the skin See admin instructions. Sliding scales 1 to 150=0 units, 151-200=2 units, 201-250=4 units, 251-300=6 units, 301-350=8 units, 351-400=10 units >400 call Dr < 70 call Dr Patient not taking: Reported on 03/10/2022 09/14/19   [provider]  omeprazole (PRILOSEC) 20 MG capsule TAKE  (1)  CAPSULE  TWICE DAILY (TAKE ON AN EMPTY STOMACH AT LEAST 30 MIN- UTES BEFORE MEALS). Patient not taking: Reported on 03/10/2022 03/15/20   Joan Lynn, NP  ondansetron (ZOFRAN) 4 MG tablet Take 1 tablet (4 mg total) by mouth every 8 (eight) hours as  needed for nausea or vomiting. Patient not taking: Reported on 03/10/2022 02/20/20   Joan Lynn, NP  oxymetazoline (AFRIN) 0.05 % nasal spray Place 1 spray into both nostrils 2 (two) times daily. Patient not taking: Reported on 03/10/2022 02/20/20   Joan Lynn, NP  polyethylene glycol (MIRALAX / GLYCOLAX) 17 g packet Take 17 g by mouth daily.    [provider]  potassium chloride SA (KLOR-CON) 20 MEQ tablet Take 20 mEq by mouth daily. Patient not taking: Reported on 03/10/2022    [provider]  rOPINIRole (REQUIP) 0.25 MG tablet Take 1 tablet (0.25 mg total) by mouth 3 (three) times daily. Take 0.25 mg by mouth two times a day and 0.25 mg at bedtime 04/03/20 08/05/21  Joan Lynn, NP  sertraline (ZOLOFT)  100 MG tablet Take 100 mg by mouth daily. 07/12/21   [provider]  SUPER B COMPLEX/C PO Take 1 tablet by mouth daily.    [provider]  traZODone (DESYREL) 50 MG tablet Take 25 mg by mouth at bedtime as needed. 07/13/21   [provider]    Physical Exam    Vital Signs:  [name] does not have vital signs available for review today.***  Given telephonic nature of communication, physical exam is limited. AAOx3. NAD. Normal affect.  Speech and respirations are unlabored.  Accessory Clinical Findings    None  Assessment & Plan    1.  Preoperative Cardiovascular Risk Assessment:  {Select to add RCRI Risk (<1%=LOW; >/=1%=HIGH) (Optional):21036017}  {Select if HIGH (RCRI >/=1%) Risk (Optional):21036030} Recommendations: {2014 ACC/AHA Perioperative Guidelines  :21036001} Antiplatelet and/or Anticoagulation Recommendations: {Antiplatelet Recommendations                  :21036016} {Anticoagulation Recommendations           :75643329}  A copy of this note will be routed to requesting surgeon.  Time:   Today, I have spent *** minutes with the patient with telehealth technology discussing medical history, symptoms, and  management plan.     Elgie Collard, PA-C  03/18/2022, 1:26 PM

## 2022-03-18 NOTE — Telephone Encounter (Signed)
Pt RN calling because pt missed her preop appt due to telephone issues, she ask that for the next appt to call number below   Ask for nurse of the pt - [phone]

## 2022-03-24 ENCOUNTER — Ambulatory Visit: Payer: PPO | Attending: Cardiology | Admitting: General Practice

## 2022-03-24 DIAGNOSIS — Z01810 Encounter for preprocedural cardiovascular examination: Secondary | ICD-10-CM

## 2022-03-24 NOTE — Progress Notes (Signed)
Virtual Visit via Telephone Note   Because of Jenni A [name]'s co-morbid illnesses, she is at least at moderate risk for complications without adequate follow up.  This format is felt to be most appropriate for this patient at this time.  The patient did not have access to video technology/had technical difficulties with video requiring transitioning to audio format only (telephone).  All issues noted in this document were discussed and addressed.  No physical exam could be performed with this format.  Please refer to the patient's chart for her consent to telehealth for [facility].  Evaluation Performed:  Preoperative cardiovascular risk assessment _____________   Date:  03/24/2022   Patient ID:  [name], DOB [birth year], MRN [id number] Patient Location:  Home Provider location:   Office  Primary Care Provider:  Ivy Lynn, NP Primary Cardiologist:  Peter Martinique, MD  Chief Complaint / Patient Profile   88 y.o. y/o female with a h/o hypertension, atrial fibrillation, CVA, chronic diastolic CHF who is pending hernia surgery and presents today for telephonic preoperative cardiovascular risk assessment.  Past Medical History    Past Medical History:  Diagnosis Date   Arthritis    Atrial fibrillation ([place])    Cancer ([place])    breast   Cataract    Diabetes mellitus without complication ([place])    Frequent UTI    Hyperlipidemia    Hypertension    Neck pain    Scoliosis    Stroke [facility])    Past Surgical History:  Procedure Laterality Date   APPENDECTOMY     BREAST LUMPECTOMY Left    CHOLECYSTECTOMY     JOINT REPLACEMENT     bilat knee    REPLACEMENT TOTAL KNEE BILATERAL Bilateral    TONSILLECTOMY      Allergies  Allergies  Allergen Reactions   Diltiazem Hcl Itching, Other (See Comments) and Rash    Red itchy rash started a few hours after taking short acting 122m dilt tabs Red itchy rash started a few hours after taking short acting 1278m dilt tabs Red itchy rash started a few hours after taking short acting 1204milt tabs   Duloxetine Hcl Anxiety, Other (See Comments) and Rash    Makes patient feel "restless and scared" and unable to sleep Makes patient feel "restless and scared" and unable to sleep Makes patient feel "restless and scared" and unable to sleep   Ace Inhibitors Cough    History of Present Illness    [name] a 89 3o. female who presents via audEngineer, civil (consulting)r a telehealth visit today.  Pt was last seen in cardiology clinic on 08/05/2021 by ErnAmbrose PancoastP-C.  At that time PatARSENIA GORACKEs doing well .  The patient is now pending procedure as outlined above. Since her last visit, she remained stable from a cardiac standpoint.  Today she denies chest pain, shortness of breath, lower extremity edema, fatigue, palpitations, melena, hematuria, hemoptysis, diaphoresis, weakness, presyncope, syncope, orthopnea, and PND.  Home Medications    Prior to Admission medications   Medication Sig Start Date End Date Taking? Authorizing Provider  acetaminophen (TYLENOL) 500 MG tablet Take 1 tablet (500 mg total) by mouth every 6 (six) hours as needed for moderate pain. Patient not taking: Reported on 03/10/2022 07/16/19   Arrien, MauJimmy PicketD  Apoaequorin ([facility] CenterTRA STRENGTH) 20 MG CAPS Take 1 capsule by mouth daily. Patient not taking: Reported on 03/10/2022    [provider]  atorvastatin (LIPITOR) 80 MG tablet Take 1 tablet (80 mg total) by mouth at bedtime. Patient not taking: Reported on 03/10/2022 02/21/20   Ivy Lynn, NP  blood glucose meter kit and supplies Dispense based on patient and insurance preference. Use up to four times daily as directed. (FOR ICD-10 E10.9, E11.9).accucheck Patient not taking: Reported on 03/10/2022 02/20/20   Ivy Lynn, NP  celecoxib (CELEBREX) 100 MG capsule Take 100 mg by mouth 2 (two) times daily.    [provider]   Cholecalciferol (VITAMIN D3) 50 MCG (2000 UT) TABS Take 2,000 Units by mouth in the morning.    [provider]  cloNIDine (CATAPRES) 0.1 MG tablet Take 1 tablet (0.1 mg total) by mouth 2 (two) times daily as needed (for Systolic over 948). 02/21/20   Ivy Lynn, NP  COLACE CLEAR 50 MG capsule Take 50 mg by mouth daily at 12 noon. 06/29/21   [provider]  Cranberry 400 MG CAPS Take 400 mg by mouth daily.    [provider]  doxycycline (MONODOX) 100 MG capsule Take 100 mg by mouth 2 (two) times daily.    [provider]  hydrALAZINE (APRESOLINE) 25 MG tablet Take 1 tablet (25 mg total) by mouth 3 (three) times daily. 08/05/21   Marylu Lund., NP  hydrochlorothiazide (HYDRODIURIL) 50 MG tablet Take 50 mg by mouth daily. 07/15/21   [provider]  LEVEMIR FLEXTOUCH 100 UNIT/ML FlexTouch Pen Inject into the skin. 9 units in the morning and 6 units at bedtime 07/14/21   [provider]  Magnesium 250 MG TABS Take 1 tablet (250 mg total) by mouth daily. 02/20/20   Ivy Lynn, NP  metFORMIN (GLUCOPHAGE) 500 MG tablet Take 1 tablet (500 mg total) by mouth daily with breakfast. 02/20/20   Ivy Lynn, NP  methocarbamol (ROBAXIN) 500 MG tablet Take 500 mg by mouth every 12 (twelve) hours as needed for muscle spasms.    [provider]  mirtazapine (REMERON) 15 MG tablet Take 1 tablet by mouth at bedtime. 07/14/21   [provider]  NOVOLOG FLEXPEN 100 UNIT/ML FlexPen Inject 0-10 Units into the skin See admin instructions. Sliding scales 1 to 150=0 units, 151-200=2 units, 201-250=4 units, 251-300=6 units, 301-350=8 units, 351-400=10 units >400 call Dr < 70 call Dr Patient not taking: Reported on 03/10/2022 09/14/19   [provider]  omeprazole (PRILOSEC) 20 MG capsule TAKE  (1)  CAPSULE  TWICE DAILY (TAKE ON AN EMPTY STOMACH AT LEAST 30 MIN- UTES BEFORE MEALS). Patient not taking: Reported on 03/10/2022 03/15/20    Ivy Lynn, NP  ondansetron (ZOFRAN) 4 MG tablet Take 1 tablet (4 mg total) by mouth every 8 (eight) hours as needed for nausea or vomiting. Patient not taking: Reported on 03/10/2022 02/20/20   Ivy Lynn, NP  oxymetazoline (AFRIN) 0.05 % nasal spray Place 1 spray into both nostrils 2 (two) times daily. Patient not taking: Reported on 03/10/2022 02/20/20   Ivy Lynn, NP  polyethylene glycol (MIRALAX / GLYCOLAX) 17 g packet Take 17 g by mouth daily.    [provider]  potassium chloride SA (KLOR-CON) 20 MEQ tablet Take 20 mEq by mouth daily. Patient not taking: Reported on 03/10/2022    [provider]  rOPINIRole (REQUIP) 0.25 MG tablet Take 1 tablet (0.25 mg total) by mouth 3 (three) times daily. Take 0.25 mg by mouth two times a day and 0.25 mg at bedtime 04/03/20  08/05/21  Ivy Lynn, NP  sertraline (ZOLOFT) 100 MG tablet Take 100 mg by mouth daily. 07/12/21   [provider]  SUPER B COMPLEX/C PO Take 1 tablet by mouth daily.    [provider]  traZODone (DESYREL) 50 MG tablet Take 25 mg by mouth at bedtime as needed. 07/13/21   [provider]    Physical Exam    Vital Signs:  [name] does not have vital signs available for review today.  Given telephonic nature of communication, physical exam is limited. AAOx3. NAD. Normal affect.  Speech and respirations are unlabored.  Accessory Clinical Findings    None  Assessment & Plan    1.  Preoperative Cardiovascular Risk Assessment: Hernia surgery, Dr.Tsuei, [place] [facility]    Primary Cardiologist: Peter Martinique, MD  Chart reviewed as part of pre-operative protocol coverage. Given past medical history and time since last visit, based on ACC/AHA guidelines, KIMYA MCCAHILL would be at acceptable risk for the planned procedure without further cardiovascular testing.   Her RCRI is a class IV risk, 11% risk of major cardiac event.  She is able to  complete greater than 4 METS of physical activity.  Patient was advised that if she develops new symptoms prior to surgery to contact our office to arrange a follow-up appointment.  He verbalized understanding.  I will route this recommendation to the requesting party via Epic fax function and remove from pre-op pool.     Time:   Today, I have spent 8 minutes with the patient with telehealth technology discussing medical history, symptoms, and management plan.  Prior to her phone evaluation I spent greater than 10 minutes reviewing her past medical history and cardiac medications   Deberah Pelton, NP  03/24/2022, 8:30 AM

## 2022-04-02 DIAGNOSIS — F329 Major depressive disorder, single episode, unspecified: Secondary | ICD-10-CM | POA: Diagnosis not present

## 2022-04-02 DIAGNOSIS — N189 Chronic kidney disease, unspecified: Secondary | ICD-10-CM | POA: Diagnosis not present

## 2022-04-02 DIAGNOSIS — E559 Vitamin D deficiency, unspecified: Secondary | ICD-10-CM | POA: Diagnosis not present

## 2022-04-02 DIAGNOSIS — I10 Essential (primary) hypertension: Secondary | ICD-10-CM | POA: Diagnosis not present

## 2022-04-02 DIAGNOSIS — I4891 Unspecified atrial fibrillation: Secondary | ICD-10-CM | POA: Diagnosis not present

## 2022-04-02 DIAGNOSIS — E119 Type 2 diabetes mellitus without complications: Secondary | ICD-10-CM | POA: Diagnosis not present

## 2022-04-07 DIAGNOSIS — E119 Type 2 diabetes mellitus without complications: Secondary | ICD-10-CM | POA: Diagnosis not present

## 2022-04-07 DIAGNOSIS — R059 Cough, unspecified: Secondary | ICD-10-CM | POA: Diagnosis not present

## 2022-04-07 DIAGNOSIS — N189 Chronic kidney disease, unspecified: Secondary | ICD-10-CM | POA: Diagnosis not present

## 2022-04-07 DIAGNOSIS — R0602 Shortness of breath: Secondary | ICD-10-CM | POA: Diagnosis not present

## 2022-04-07 DIAGNOSIS — G4700 Insomnia, unspecified: Secondary | ICD-10-CM | POA: Diagnosis not present

## 2022-04-07 DIAGNOSIS — N390 Urinary tract infection, site not specified: Secondary | ICD-10-CM | POA: Diagnosis not present

## 2022-04-07 DIAGNOSIS — K5900 Constipation, unspecified: Secondary | ICD-10-CM | POA: Diagnosis not present

## 2022-04-07 DIAGNOSIS — R062 Wheezing: Secondary | ICD-10-CM | POA: Diagnosis not present

## 2022-04-07 DIAGNOSIS — K429 Umbilical hernia without obstruction or gangrene: Secondary | ICD-10-CM | POA: Diagnosis not present

## 2022-04-07 DIAGNOSIS — K219 Gastro-esophageal reflux disease without esophagitis: Secondary | ICD-10-CM | POA: Diagnosis not present

## 2022-04-07 DIAGNOSIS — M1990 Unspecified osteoarthritis, unspecified site: Secondary | ICD-10-CM | POA: Diagnosis not present

## 2022-04-07 NOTE — Pre-Procedure Instructions (Signed)
[name]  04/07/2022     [name]'s procedure is scheduled on Tuesday, December 5th.  Report to [facility] Admitting at 1000 A.M.   Call this number if you have problems the morning of surgery:  [phone] this is the Pre- Surgery desk.       >>>>>Please send patient's Medication Record with medications administrated documentation. ( this information is required prior to OR. This includes medications that may have been on hold for surgery)<<<<<   If patient experiences any cold or flu symptoms such as cough, fever, chills, shortness of breath, etc. between now and your scheduled surgery, please notify us at the above number.   Remember:  Do not eat after midnight.  You may drink clear liquids until 9:30 AM .  Clear liquids allowed are:                    Water, Juice (No red color; non-citric and without pulp; diabetics please choose diet or no sugar options), Carbonated beverages (diabetics please choose diet or no sugar options), Clear Tea (No creamer, milk, or cream, including half & half and powdered creamer), Black Coffee Only (No creamer, milk or cream, including half & half and powdered creamer), Plain Jell-O Only (No red color; diabetics please choose no sugar options), Clear Sports drink (No red color; diabetics please choose diet or no sugar options), and Plain Popsicles Only (No red color; diabetics please choose no sugar options)     Take these medicines the morning of surgery with A SIP OF WATER: cloNIDine (CATAPRES)   hydrALAZINE (APRESOLINE)   omeprazole (PRILOSEC) rOPINIRole (REQUIP)  May take if needed: acetaminophen (TYLENOL  HYDROcodone-acetaminophen (NORCO/VICODIN)  meclizine (ANTIVERT)  ondansetron (ZOFRAN)  sertraline (ZOLOFT)   Hold Vitamins and herbal products.  WHAT DO I DO ABOUT MY DIABETES MEDICATION?  Do not take oral diabetes medicines (pills) the morning of surgery.- Metformin  THE NIGHT BEFORE SURGERY, take 3 units of  LEVEMIR insulin. ( 1/2 dose)     THE MORNING OF SURGERY, take 4 units of Levemir insulin. (1/2 dose)  Check patient's blood sugar the morning of your surgery when you wake up and every 2 hours until you get to the Short Stay unit. If  blood sugar is less than 70 mg/dL, you will need to treat for low blood sugar: Do not take insulin. Treat a low blood sugar (less than 70 mg/dL) with  cup of clear juice (cranberry or apple), 4 glucose tablets, OR glucose gel. Recheck blood sugar in 15 minutes after treatment (to make sure it is greater than 70 mg/dL). If your blood sugar is not greater than 70 mg/dL on recheck, call [phone]  for further instructions. Report your blood sugar to the short stay nurse when you get to Short Stay.  If you are admitted to the hospital after surgery: Your blood sugar will be checked by the staff and you will probably be given insulin after surgery (instead of oral diabetes medicines) to make sure you have good blood sugar levels. The goal for blood sugar control after surgery is 80-180.    Patient should have a shower, with antibacteria soap, the morning of surgery . Dry off with a clean towel.  Patient should not have lotions, powders, colognes, deodorant, jewelry, or piercing's. Wear clean comfortable clothes.  Brush teeth.    Man is not responsible for any belongings or valuables.  Contacts, dentures or bridgework may not be worn  into surgery.

## 2022-04-07 NOTE — Progress Notes (Addendum)
I spoke to [place], a Librarian, academic at [employer], where [place] resides.  Sherlon Handing reports that she has ordered a stat Chest-X-Ray.  Sherlon Handing states that patient's oxygen saturation dropped to 84 % on Saturday, and wheezing was heard upon ausculation.  Ms Adderly was started on 3l of oxygen, saturations are now 92%. I called Dr. Vonna Kotyk office. I spoke with Mickel Baas, giving her the above information, Mickel Baas will notify Dr. Johnny Bridge. Dr. Johnny Bridge called, he will with for anesthesia decision.  I gave information to [employer].  I received a call from [place] at [facility], "the practitioner said to cancel surgery due to Hypoxia, more testing will be done.  I called Dr.Tsuei and gave the information to [place].

## 2022-04-08 ENCOUNTER — Ambulatory Visit (HOSPITAL_COMMUNITY): Admission: RE | Admit: 2022-04-08 | Payer: PPO | Source: Home / Self Care | Admitting: Surgery

## 2022-04-08 SURGERY — REPAIR, HERNIA, UMBILICAL, ADULT
Anesthesia: General

## 2022-04-10 DIAGNOSIS — K5900 Constipation, unspecified: Secondary | ICD-10-CM | POA: Diagnosis not present

## 2022-04-10 DIAGNOSIS — R059 Cough, unspecified: Secondary | ICD-10-CM | POA: Diagnosis not present

## 2022-04-10 DIAGNOSIS — R5381 Other malaise: Secondary | ICD-10-CM | POA: Diagnosis not present

## 2022-04-10 DIAGNOSIS — N189 Chronic kidney disease, unspecified: Secondary | ICD-10-CM | POA: Diagnosis not present

## 2022-04-10 DIAGNOSIS — Z7901 Long term (current) use of anticoagulants: Secondary | ICD-10-CM | POA: Diagnosis not present

## 2022-04-10 DIAGNOSIS — I5032 Chronic diastolic (congestive) heart failure: Secondary | ICD-10-CM | POA: Diagnosis not present

## 2022-04-10 DIAGNOSIS — I739 Peripheral vascular disease, unspecified: Secondary | ICD-10-CM | POA: Diagnosis not present

## 2022-04-10 DIAGNOSIS — K219 Gastro-esophageal reflux disease without esophagitis: Secondary | ICD-10-CM | POA: Diagnosis not present

## 2022-04-10 DIAGNOSIS — E1142 Type 2 diabetes mellitus with diabetic polyneuropathy: Secondary | ICD-10-CM | POA: Diagnosis not present

## 2022-04-10 DIAGNOSIS — E559 Vitamin D deficiency, unspecified: Secondary | ICD-10-CM | POA: Diagnosis not present

## 2022-04-10 DIAGNOSIS — N390 Urinary tract infection, site not specified: Secondary | ICD-10-CM | POA: Diagnosis not present

## 2022-04-14 DIAGNOSIS — F329 Major depressive disorder, single episode, unspecified: Secondary | ICD-10-CM | POA: Diagnosis not present

## 2022-04-14 DIAGNOSIS — G8929 Other chronic pain: Secondary | ICD-10-CM | POA: Diagnosis not present

## 2022-04-14 DIAGNOSIS — G4720 Circadian rhythm sleep disorder, unspecified type: Secondary | ICD-10-CM | POA: Diagnosis not present

## 2022-05-04 DIAGNOSIS — I10 Essential (primary) hypertension: Secondary | ICD-10-CM | POA: Diagnosis not present

## 2022-05-04 DIAGNOSIS — E119 Type 2 diabetes mellitus without complications: Secondary | ICD-10-CM | POA: Diagnosis not present

## 2022-05-04 DIAGNOSIS — N189 Chronic kidney disease, unspecified: Secondary | ICD-10-CM | POA: Diagnosis not present

## 2022-05-04 DIAGNOSIS — I4891 Unspecified atrial fibrillation: Secondary | ICD-10-CM | POA: Diagnosis not present

## 2022-05-04 DIAGNOSIS — M1990 Unspecified osteoarthritis, unspecified site: Secondary | ICD-10-CM | POA: Diagnosis not present

## 2022-05-04 DIAGNOSIS — F329 Major depressive disorder, single episode, unspecified: Secondary | ICD-10-CM | POA: Diagnosis not present

## 2022-05-04 DIAGNOSIS — E559 Vitamin D deficiency, unspecified: Secondary | ICD-10-CM | POA: Diagnosis not present

## 2022-05-04 DIAGNOSIS — I5032 Chronic diastolic (congestive) heart failure: Secondary | ICD-10-CM | POA: Diagnosis not present

## 2022-05-07 DIAGNOSIS — G4700 Insomnia, unspecified: Secondary | ICD-10-CM | POA: Diagnosis not present

## 2022-05-07 DIAGNOSIS — I739 Peripheral vascular disease, unspecified: Secondary | ICD-10-CM | POA: Diagnosis not present

## 2022-05-07 DIAGNOSIS — I10 Essential (primary) hypertension: Secondary | ICD-10-CM | POA: Diagnosis not present

## 2022-05-07 DIAGNOSIS — N189 Chronic kidney disease, unspecified: Secondary | ICD-10-CM | POA: Diagnosis not present

## 2022-05-07 DIAGNOSIS — I4891 Unspecified atrial fibrillation: Secondary | ICD-10-CM | POA: Diagnosis not present

## 2022-05-07 DIAGNOSIS — R059 Cough, unspecified: Secondary | ICD-10-CM | POA: Diagnosis not present

## 2022-05-07 DIAGNOSIS — K5900 Constipation, unspecified: Secondary | ICD-10-CM | POA: Diagnosis not present

## 2022-05-07 DIAGNOSIS — R5381 Other malaise: Secondary | ICD-10-CM | POA: Diagnosis not present

## 2022-05-07 DIAGNOSIS — K429 Umbilical hernia without obstruction or gangrene: Secondary | ICD-10-CM | POA: Diagnosis not present

## 2022-05-07 DIAGNOSIS — J189 Pneumonia, unspecified organism: Secondary | ICD-10-CM | POA: Diagnosis not present

## 2022-05-07 DIAGNOSIS — N390 Urinary tract infection, site not specified: Secondary | ICD-10-CM | POA: Diagnosis not present

## 2022-05-08 ENCOUNTER — Encounter (HOSPITAL_COMMUNITY): Payer: Self-pay | Admitting: Emergency Medicine

## 2022-05-08 ENCOUNTER — Emergency Department (HOSPITAL_COMMUNITY): Payer: PPO

## 2022-05-08 ENCOUNTER — Inpatient Hospital Stay (HOSPITAL_COMMUNITY)
Admission: EM | Admit: 2022-05-08 | Discharge: 2022-05-12 | DRG: 193 | Disposition: A | Discharge status: Skilled Nursing Facility | Payer: PPO | Source: Skilled Nursing Facility | Attending: Family Medicine | Admitting: Family Medicine

## 2022-05-08 ENCOUNTER — Other Ambulatory Visit: Payer: Self-pay

## 2022-05-08 DIAGNOSIS — J9601 Acute respiratory failure with hypoxia: Secondary | ICD-10-CM | POA: Diagnosis present

## 2022-05-08 DIAGNOSIS — Z9181 History of falling: Secondary | ICD-10-CM

## 2022-05-08 DIAGNOSIS — Z811 Family history of alcohol abuse and dependence: Secondary | ICD-10-CM | POA: Diagnosis not present

## 2022-05-08 DIAGNOSIS — R062 Wheezing: Secondary | ICD-10-CM | POA: Diagnosis not present

## 2022-05-08 DIAGNOSIS — I5032 Chronic diastolic (congestive) heart failure: Secondary | ICD-10-CM | POA: Diagnosis not present

## 2022-05-08 DIAGNOSIS — Z66 Do not resuscitate: Secondary | ICD-10-CM | POA: Diagnosis not present

## 2022-05-08 DIAGNOSIS — Z96653 Presence of artificial knee joint, bilateral: Secondary | ICD-10-CM | POA: Diagnosis present

## 2022-05-08 DIAGNOSIS — R296 Repeated falls: Secondary | ICD-10-CM | POA: Diagnosis present

## 2022-05-08 DIAGNOSIS — R0602 Shortness of breath: Secondary | ICD-10-CM | POA: Diagnosis not present

## 2022-05-08 DIAGNOSIS — N390 Urinary tract infection, site not specified: Secondary | ICD-10-CM | POA: Diagnosis not present

## 2022-05-08 DIAGNOSIS — E785 Hyperlipidemia, unspecified: Secondary | ICD-10-CM | POA: Diagnosis present

## 2022-05-08 DIAGNOSIS — R0902 Hypoxemia: Principal | ICD-10-CM

## 2022-05-08 DIAGNOSIS — Z7401 Bed confinement status: Secondary | ICD-10-CM | POA: Diagnosis not present

## 2022-05-08 DIAGNOSIS — Z808 Family history of malignant neoplasm of other organs or systems: Secondary | ICD-10-CM

## 2022-05-08 DIAGNOSIS — I152 Hypertension secondary to endocrine disorders: Secondary | ICD-10-CM | POA: Diagnosis present

## 2022-05-08 DIAGNOSIS — R279 Unspecified lack of coordination: Secondary | ICD-10-CM | POA: Diagnosis not present

## 2022-05-08 DIAGNOSIS — R001 Bradycardia, unspecified: Secondary | ICD-10-CM | POA: Diagnosis not present

## 2022-05-08 DIAGNOSIS — R059 Cough, unspecified: Secondary | ICD-10-CM | POA: Diagnosis not present

## 2022-05-08 DIAGNOSIS — K449 Diaphragmatic hernia without obstruction or gangrene: Secondary | ICD-10-CM | POA: Diagnosis not present

## 2022-05-08 DIAGNOSIS — K219 Gastro-esophageal reflux disease without esophagitis: Secondary | ICD-10-CM | POA: Diagnosis not present

## 2022-05-08 DIAGNOSIS — J189 Pneumonia, unspecified organism: Secondary | ICD-10-CM | POA: Diagnosis not present

## 2022-05-08 DIAGNOSIS — E1159 Type 2 diabetes mellitus with other circulatory complications: Secondary | ICD-10-CM | POA: Diagnosis present

## 2022-05-08 DIAGNOSIS — F32A Depression, unspecified: Secondary | ICD-10-CM | POA: Diagnosis not present

## 2022-05-08 DIAGNOSIS — Z79899 Other long term (current) drug therapy: Secondary | ICD-10-CM

## 2022-05-08 DIAGNOSIS — J8410 Pulmonary fibrosis, unspecified: Secondary | ICD-10-CM | POA: Diagnosis present

## 2022-05-08 DIAGNOSIS — E1169 Type 2 diabetes mellitus with other specified complication: Secondary | ICD-10-CM | POA: Diagnosis present

## 2022-05-08 DIAGNOSIS — Z801 Family history of malignant neoplasm of trachea, bronchus and lung: Secondary | ICD-10-CM | POA: Diagnosis not present

## 2022-05-08 DIAGNOSIS — R778 Other specified abnormalities of plasma proteins: Secondary | ICD-10-CM | POA: Diagnosis not present

## 2022-05-08 DIAGNOSIS — Z1152 Encounter for screening for COVID-19: Secondary | ICD-10-CM | POA: Diagnosis not present

## 2022-05-08 DIAGNOSIS — Z794 Long term (current) use of insulin: Secondary | ICD-10-CM | POA: Diagnosis not present

## 2022-05-08 DIAGNOSIS — Z8249 Family history of ischemic heart disease and other diseases of the circulatory system: Secondary | ICD-10-CM | POA: Diagnosis not present

## 2022-05-08 DIAGNOSIS — R5381 Other malaise: Secondary | ICD-10-CM | POA: Diagnosis not present

## 2022-05-08 DIAGNOSIS — Z8673 Personal history of transient ischemic attack (TIA), and cerebral infarction without residual deficits: Secondary | ICD-10-CM

## 2022-05-08 DIAGNOSIS — R591 Generalized enlarged lymph nodes: Secondary | ICD-10-CM | POA: Diagnosis present

## 2022-05-08 DIAGNOSIS — Z8261 Family history of arthritis: Secondary | ICD-10-CM

## 2022-05-08 DIAGNOSIS — R197 Diarrhea, unspecified: Secondary | ICD-10-CM | POA: Diagnosis not present

## 2022-05-08 DIAGNOSIS — Z853 Personal history of malignant neoplasm of breast: Secondary | ICD-10-CM | POA: Diagnosis not present

## 2022-05-08 DIAGNOSIS — I4821 Permanent atrial fibrillation: Secondary | ICD-10-CM | POA: Diagnosis not present

## 2022-05-08 DIAGNOSIS — K429 Umbilical hernia without obstruction or gangrene: Secondary | ICD-10-CM | POA: Diagnosis not present

## 2022-05-08 DIAGNOSIS — Z888 Allergy status to other drugs, medicaments and biological substances status: Secondary | ICD-10-CM

## 2022-05-08 DIAGNOSIS — N179 Acute kidney failure, unspecified: Secondary | ICD-10-CM | POA: Diagnosis not present

## 2022-05-08 DIAGNOSIS — R7989 Other specified abnormal findings of blood chemistry: Secondary | ICD-10-CM

## 2022-05-08 DIAGNOSIS — I10 Essential (primary) hypertension: Secondary | ICD-10-CM | POA: Diagnosis not present

## 2022-05-08 DIAGNOSIS — F419 Anxiety disorder, unspecified: Secondary | ICD-10-CM | POA: Diagnosis present

## 2022-05-08 DIAGNOSIS — E1140 Type 2 diabetes mellitus with diabetic neuropathy, unspecified: Secondary | ICD-10-CM | POA: Diagnosis present

## 2022-05-08 DIAGNOSIS — J439 Emphysema, unspecified: Secondary | ICD-10-CM | POA: Diagnosis not present

## 2022-05-08 DIAGNOSIS — Z7984 Long term (current) use of oral hypoglycemic drugs: Secondary | ICD-10-CM

## 2022-05-08 DIAGNOSIS — Z9981 Dependence on supplemental oxygen: Secondary | ICD-10-CM

## 2022-05-08 HISTORY — DX: Acute respiratory failure with hypoxia: J96.01

## 2022-05-08 HISTORY — DX: Pneumonia, unspecified organism: J18.9

## 2022-05-08 LAB — RESPIRATORY PANEL BY PCR

## 2022-05-08 LAB — RESP PANEL BY RT-PCR (RSV, FLU A&B, COVID)  RVPGX2
Influenza A by PCR: NEGATIVE
Influenza B by PCR: NEGATIVE
Resp Syncytial Virus by PCR: NEGATIVE
SARS Coronavirus 2 by RT PCR: NEGATIVE

## 2022-05-08 LAB — URINALYSIS, ROUTINE W REFLEX MICROSCOPIC
Bilirubin Urine: NEGATIVE
Glucose, UA: NEGATIVE mg/dL
Ketones, ur: NEGATIVE mg/dL
Nitrite: NEGATIVE
Protein, ur: 100 mg/dL — AB
Specific Gravity, Urine: >1.046 — ABNORMAL HIGH (ref 1.005–1.030)
WBC, UA: >50 WBC/hpf — ABNORMAL HIGH (ref 0–5)
pH: 5 (ref 5.0–8.0)

## 2022-05-08 LAB — CBC WITH DIFFERENTIAL/PLATELET
Abs Immature Granulocytes: 0.04 10*3/uL (ref 0.00–0.07)
Basophils Absolute: 0 10*3/uL (ref 0.0–0.1)
Basophils Relative: 0 %
Eosinophils Absolute: 0.2 10*3/uL (ref 0.0–0.5)
Eosinophils Relative: 2 %
HCT: 34.9 % — ABNORMAL LOW (ref 36.0–46.0)
Hemoglobin: 10.2 g/dL — ABNORMAL LOW (ref 12.0–15.0)
Immature Granulocytes: 1 %
Lymphocytes Relative: 17 %
Lymphs Abs: 1.3 10*3/uL (ref 0.7–4.0)
MCH: 23.8 pg — ABNORMAL LOW (ref 26.0–34.0)
MCHC: 29.2 g/dL — ABNORMAL LOW (ref 30.0–36.0)
MCV: 81.4 fL (ref 80.0–100.0)
Monocytes Absolute: 1.6 10*3/uL — ABNORMAL HIGH (ref 0.1–1.0)
Monocytes Relative: 21 %
Neutro Abs: 4.6 10*3/uL (ref 1.7–7.7)
Neutrophils Relative %: 59 %
Platelets: 163 10*3/uL (ref 150–400)
RBC: 4.29 MIL/uL (ref 3.87–5.11)
RDW: 17.9 % — ABNORMAL HIGH (ref 11.5–15.5)
WBC: 7.8 10*3/uL (ref 4.0–10.5)
nRBC: 0 % (ref 0.0–0.2)

## 2022-05-08 LAB — BLOOD GAS, VENOUS
Acid-Base Excess: 0.8 mmol/L (ref 0.0–2.0)
Bicarbonate: 27.1 mmol/L (ref 20.0–28.0)
Drawn by: 43739
O2 Saturation: 63.1 %
Patient temperature: 37.1
pCO2, Ven: 49 mmHg (ref 44–60)
pH, Ven: 7.35 (ref 7.25–7.43)
pO2, Ven: 38 mmHg (ref 32–45)

## 2022-05-08 LAB — BASIC METABOLIC PANEL
Anion gap: 10 (ref 5–15)
BUN: 35 mg/dL — ABNORMAL HIGH (ref 8–23)
CO2: 23 mmol/L (ref 22–32)
Calcium: 8.3 mg/dL — ABNORMAL LOW (ref 8.9–10.3)
Chloride: 104 mmol/L (ref 98–111)
Creatinine, Ser: 1.36 mg/dL — ABNORMAL HIGH (ref 0.44–1.00)
GFR, Estimated: 36 mL/min — ABNORMAL LOW (ref >60)
Glucose, Bld: 128 mg/dL — ABNORMAL HIGH (ref 70–99)
Potassium: 4.1 mmol/L (ref 3.5–5.1)
Sodium: 137 mmol/L (ref 135–145)

## 2022-05-08 LAB — TROPONIN I (HIGH SENSITIVITY)
Troponin I (High Sensitivity): 110 ng/L (ref <18)
Troponin I (High Sensitivity): 90 ng/L — ABNORMAL HIGH (ref <18)

## 2022-05-08 LAB — BRAIN NATRIURETIC PEPTIDE: B Natriuretic Peptide: 421 pg/mL — ABNORMAL HIGH (ref 0.0–100.0)

## 2022-05-08 LAB — CBG MONITORING, ED: Glucose-Capillary: 74 mg/dL (ref 70–99)

## 2022-05-08 MED ORDER — IPRATROPIUM-ALBUTEROL 0.5-2.5 (3) MG/3ML IN SOLN
3.0000 mL | Freq: Four times a day (QID) | RESPIRATORY_TRACT | Status: DC | PRN
  Administered 2022-05-10: 3 mL via RESPIRATORY_TRACT
  Filled 2022-05-08: qty 3

## 2022-05-08 MED ORDER — ATORVASTATIN CALCIUM 40 MG PO TABS
80.0000 mg | ORAL_TABLET | Freq: Every day | ORAL | Status: DC
  Administered 2022-05-08 – 2022-05-11 (×4): 80 mg via ORAL
  Filled 2022-05-08 (×4): qty 2

## 2022-05-08 MED ORDER — HYDROCHLOROTHIAZIDE 25 MG PO TABS
50.0000 mg | ORAL_TABLET | Freq: Every day | ORAL | Status: DC
  Administered 2022-05-08 – 2022-05-12 (×5): 50 mg via ORAL
  Filled 2022-05-08 (×5): qty 2

## 2022-05-08 MED ORDER — DOCUSATE SODIUM 100 MG PO CAPS
100.0000 mg | ORAL_CAPSULE | Freq: Every day | ORAL | Status: DC
  Administered 2022-05-08 – 2022-05-12 (×5): 100 mg via ORAL
  Filled 2022-05-08 (×5): qty 1

## 2022-05-08 MED ORDER — POLYETHYLENE GLYCOL 3350 17 G PO PACK: 17.0000 g | PACK | Freq: Every day | ORAL | Status: DC | PRN

## 2022-05-08 MED ORDER — MAGNESIUM 250 MG PO TABS: 250.0000 mg | ORAL_TABLET | Freq: Every day | ORAL | Status: DC

## 2022-05-08 MED ORDER — HEPARIN SODIUM (PORCINE) 5000 UNIT/ML IJ SOLN
5000.0000 [IU] | Freq: Three times a day (TID) | INTRAMUSCULAR | Status: DC
  Administered 2022-05-08 – 2022-05-12 (×11): 5000 [IU] via SUBCUTANEOUS
  Filled 2022-05-08 (×11): qty 1

## 2022-05-08 MED ORDER — SODIUM CHLORIDE 0.9 % IV SOLN
2.0000 g | INTRAVENOUS | Status: DC
  Administered 2022-05-08 – 2022-05-11 (×4): 2 g via INTRAVENOUS
  Filled 2022-05-08 (×4): qty 20

## 2022-05-08 MED ORDER — IOHEXOL 350 MG/ML SOLN
60.0000 mL | Freq: Once | INTRAVENOUS | Status: AC | PRN
  Administered 2022-05-08: 60 mL via INTRAVENOUS

## 2022-05-08 MED ORDER — ACETAMINOPHEN 650 MG RE SUPP: 650.0000 mg | Freq: Four times a day (QID) | RECTAL | Status: DC | PRN

## 2022-05-08 MED ORDER — INSULIN ASPART 100 UNIT/ML IJ SOLN: 0.0000 [IU] | Freq: Every day | INTRAMUSCULAR | Status: DC

## 2022-05-08 MED ORDER — ASPIRIN 81 MG PO CHEW
324.0000 mg | CHEWABLE_TABLET | Freq: Once | ORAL | Status: AC
  Administered 2022-05-08: 324 mg via ORAL
  Filled 2022-05-08: qty 4

## 2022-05-08 MED ORDER — ACETAMINOPHEN 325 MG PO TABS: 650.0000 mg | ORAL_TABLET | Freq: Four times a day (QID) | ORAL | Status: DC | PRN

## 2022-05-08 MED ORDER — SODIUM CHLORIDE 0.9 % IV SOLN
100.0000 mg | Freq: Two times a day (BID) | INTRAVENOUS | Status: DC
  Administered 2022-05-08 – 2022-05-12 (×8): 100 mg via INTRAVENOUS
  Filled 2022-05-08 (×13): qty 100

## 2022-05-08 MED ORDER — CLONIDINE HCL 0.1 MG PO TABS
0.1000 mg | ORAL_TABLET | Freq: Two times a day (BID) | ORAL | Status: DC
  Administered 2022-05-08 – 2022-05-12 (×8): 0.1 mg via ORAL
  Filled 2022-05-08 (×8): qty 1

## 2022-05-08 MED ORDER — ROPINIROLE HCL 0.25 MG PO TABS
0.2500 mg | ORAL_TABLET | Freq: Three times a day (TID) | ORAL | Status: DC
  Administered 2022-05-08 – 2022-05-12 (×10): 0.25 mg via ORAL
  Filled 2022-05-08 (×11): qty 1

## 2022-05-08 MED ORDER — IPRATROPIUM-ALBUTEROL 0.5-2.5 (3) MG/3ML IN SOLN
3.0000 mL | Freq: Once | RESPIRATORY_TRACT | Status: AC
  Administered 2022-05-08: 3 mL via RESPIRATORY_TRACT
  Filled 2022-05-08: qty 3

## 2022-05-08 MED ORDER — APOAEQUORIN 20 MG PO CAPS: 20.0000 mg | ORAL_CAPSULE | Freq: Every day | ORAL | Status: DC

## 2022-05-08 MED ORDER — VITAMIN D 25 MCG (1000 UNIT) PO TABS
2000.0000 [IU] | ORAL_TABLET | Freq: Every morning | ORAL | Status: DC
  Administered 2022-05-09 – 2022-05-12 (×4): 2000 [IU] via ORAL
  Filled 2022-05-08 (×4): qty 2

## 2022-05-08 MED ORDER — MIRTAZAPINE 15 MG PO TABS
30.0000 mg | ORAL_TABLET | Freq: Every day | ORAL | Status: DC
  Administered 2022-05-08 – 2022-05-11 (×4): 30 mg via ORAL
  Filled 2022-05-08: qty 2
  Filled 2022-05-08: qty 1
  Filled 2022-05-08 (×2): qty 2

## 2022-05-08 MED ORDER — MAGNESIUM OXIDE -MG SUPPLEMENT 400 (240 MG) MG PO TABS
200.0000 mg | ORAL_TABLET | Freq: Every day | ORAL | Status: DC
  Administered 2022-05-08 – 2022-05-12 (×5): 200 mg via ORAL
  Filled 2022-05-08 (×5): qty 1

## 2022-05-08 MED ORDER — HYDRALAZINE HCL 25 MG PO TABS
25.0000 mg | ORAL_TABLET | Freq: Three times a day (TID) | ORAL | Status: DC
  Administered 2022-05-08 – 2022-05-11 (×9): 25 mg via ORAL
  Filled 2022-05-08 (×11): qty 1

## 2022-05-08 MED ORDER — ALBUTEROL SULFATE (2.5 MG/3ML) 0.083% IN NEBU
2.5000 mg | INHALATION_SOLUTION | RESPIRATORY_TRACT | Status: DC | PRN
  Administered 2022-05-10: 2.5 mg via RESPIRATORY_TRACT
  Filled 2022-05-08: qty 3

## 2022-05-08 MED ORDER — SERTRALINE HCL 50 MG PO TABS
100.0000 mg | ORAL_TABLET | Freq: Every day | ORAL | Status: DC
  Administered 2022-05-08 – 2022-05-12 (×5): 100 mg via ORAL
  Filled 2022-05-08 (×5): qty 2

## 2022-05-08 MED ORDER — INSULIN ASPART 100 UNIT/ML IJ SOLN
0.0000 [IU] | Freq: Three times a day (TID) | INTRAMUSCULAR | Status: DC
  Administered 2022-05-09: 2 [IU] via SUBCUTANEOUS
  Administered 2022-05-10 – 2022-05-11 (×3): 1 [IU] via SUBCUTANEOUS

## 2022-05-08 NOTE — ED Triage Notes (Signed)
Pt from [facility]. Arrived alert. Increasing sob. RA 88% with ems. Recent PNA and on abx. Vbg 130. 20gL ac by ems. A/o to most. Audible wheezing noted with no resp distress noted. 87%ra during triage. 2L [place] applied.

## 2022-05-08 NOTE — ED Provider Notes (Addendum)
[facility] EMERGENCY DEPARTMENT Provider Note  CSN: [id number] Arrival date & time: 05/08/22 1302  Chief Complaint(s) Shortness of Breath  HPI [name] is a 89 y.o. female with past medical history as below, significant for afib, DM, HLD, HTN, cva who presents to the ED with complaint of dib, cough, dysuria. Pt from nursing facility, pt reports that she was recently diagnosed with pneumonia. She has been having a cough that is intermittently productive but no significant DIB. She does not ambulate 2/2 prior knee injury. Typically no oxygen at home. On EMS arrival pulse ox was 85% on RA, improved to 96% on 2LNC. She denies fevers/chills/nausea/vomiting. She has a poor appetite but she attributes this to the poor quality food at her facility. She has dysuria intermittently over the last 6 mos. She also has an umbilical hernia that she is scheduled to have repaired per pt. Last BM yestd, wnl.   Past Medical History Past Medical History:  Diagnosis Date   Arthritis    Atrial fibrillation ([place])    Cancer ([place])    breast   Cataract    Diabetes mellitus without complication ([place])    Frequent UTI    Hyperlipidemia    Hypertension    Neck pain    Pneumonia    Scoliosis    Stroke [facility])    Patient Active Problem List   Diagnosis Date Noted   Acute postoperative pain of left knee 09/16/2021   Encounter to establish care 02/20/2020   Bilateral subdural hematomas ([place]) 07/16/2019   CHI (closed head injury), initial encounter 07/14/2019   Left radial head fracture 07/14/2019   SDH (subdural hematoma) ([place])    Hypertensive urgency    Subarachnoid hemorrhage following injury ([place]) 07/13/2019   Acute encephalopathy 06/27/2019   Heat stroke 06/27/2019   Hypokalemia 06/27/2019   Fever, unspecified 06/27/2019   Stroke ([place]) 06/27/2019   Altered mental status 06/26/2019   Right hip pain 06/22/2019   Chronic left hip pain 05/25/2019   Depression, recurrent ([place]) 05/25/2019   Chronic  anticoagulation 04/19/2019   Leg cramps 03/04/2019   Short-term memory loss 03/04/2019   DOE (dyspnea on exertion) 03/04/2019   Aortic atherosclerosis ([place]) 03/04/2019   Physical deconditioning 01/24/2019   History of recurrent UTIs 12/03/2018   Chronic pain of right knee 10/08/2018   Type 2 diabetes mellitus with diabetic neuropathy, unspecified ([place]) 08/12/2018   Pulmonary vascular congestion    Chronic diastolic CHF (congestive heart failure) ([place])    Acute hypoxemic respiratory failure ([place]) 12/06/2017   Permanent atrial fibrillation ([place]) 12/06/2017   Trochanteric bursitis, right hip 07/16/2017   Other intervertebral disc degeneration, lumbar region 07/16/2017   Osteoporosis 11/04/2016   Diabetic polyneuropathy associated with type 2 diabetes mellitus ([place]) 11/14/2014   Fibromyalgia 02/11/2013   CVA (cerebral vascular accident) ([place]) 08/01/2008   Osteoarthritis 04/09/2007   Hyperlipidemia associated with type 2 diabetes mellitus ([place]) 03/08/2007   Hypertension associated with diabetes ([place]) 03/08/2007   Pulmonary fibrosis ([place]) 03/08/2007   GERD 03/08/2007   BREAST CANCER, HX OF 03/08/2007   Home Medication(s) Prior to Admission medications   Medication Sig Start Date End Date Taking? Authorizing Provider  acetaminophen (TYLENOL) 325 MG tablet Take 650 mg by mouth every 6 (six) hours as needed (pain).   Yes [provider]  Apoaequorin (PREVAGEN EXTRA STRENGTH) 20 MG CAPS Take 20 mg by mouth daily.   Yes [provider]  atorvastatin (LIPITOR) 80 MG tablet Take 1 tablet (80 mg total)  by mouth at bedtime. 02/21/20  Yes Ivy Lynn, NP  celecoxib (CELEBREX) 100 MG capsule Take 100 mg by mouth 2 (two) times daily.   Yes [provider]  Cholecalciferol (VITAMIN D3) 50 MCG (2000 UT) TABS Take 2,000 Units by mouth in the morning.   Yes [provider]  cloNIDine (CATAPRES) 0.1 MG tablet Take 0.1 mg by mouth 2 (two) times daily.   Yes  [provider]  D-Mannose 500 MG CAPS Take 2,000 mg by mouth daily.   Yes [provider]  docusate sodium (COLACE) 100 MG capsule Take 100 mg by mouth daily.   Yes [provider]  doxycycline (VIBRA-TABS) 100 MG tablet Take 100 mg by mouth 2 (two) times daily. 05/07/22  Yes [provider]  estradiol (ESTRACE) 0.1 MG/GM vaginal cream Place 1 Applicatorful vaginally See admin instructions. Apply 0.5g at bedtime on Mondays and Thursdays only.   Yes [provider]  fluticasone (FLONASE) 50 MCG/ACT nasal spray Place 1 spray into both nostrils daily.   Yes [provider]  hydrALAZINE (APRESOLINE) 25 MG tablet Take 1 tablet (25 mg total) by mouth 3 (three) times daily. 08/05/21  Yes Marylu Lund., NP  hydrochlorothiazide (HYDRODIURIL) 25 MG tablet Take 50 mg by mouth daily. 07/15/21  Yes [provider]  HYDROcodone-acetaminophen (NORCO/VICODIN) 5-325 MG tablet Take 1 tablet by mouth See admin instructions. Take 1 tablet every morning. Take 1 tablet every 8 hours as needed for pain.   Yes [provider]  ipratropium-albuterol (DUONEB) 0.5-2.5 (3) MG/3ML SOLN Take 3 mLs by nebulization every 6 (six) hours as needed (wheezing/SOB).   Yes [provider]  LEVEMIR FLEXTOUCH 100 UNIT/ML FlexTouch Pen Inject 6-9 Units into the skin See admin instructions. Inject 9 units in the morning and 6 units at bedtime. 07/14/21  Yes [provider]  Magnesium 250 MG TABS Take 250 mg by mouth daily.   Yes [provider]  metFORMIN (GLUCOPHAGE) 500 MG tablet Take 1 tablet (500 mg total) by mouth daily with breakfast. 02/20/20  Yes Ivy Lynn, NP  mirtazapine (REMERON) 30 MG tablet Take 1 tablet by mouth at bedtime. 07/14/21  Yes [provider]  NOVOLOG FLEXPEN 100 UNIT/ML FlexPen Inject 0-10 Units into the skin See admin instructions. Sliding scale: CBG 1 to 150=0 units, 151-200=2 units, 201-250=4 units,  251-300=6 units, 301-350=8 units, 351-400=10 units >400 call Dr < 70 call Dr 09/14/19  Yes [provider]  ondansetron (ZOFRAN) 4 MG tablet Take 1 tablet (4 mg total) by mouth every 8 (eight) hours as needed for nausea or vomiting. 02/20/20  Yes Ivy Lynn, NP  polyethylene glycol (MIRALAX / GLYCOLAX) 17 g packet Take 17 g by mouth daily as needed (constipation).   Yes [provider]  potassium chloride (KLOR-CON M) 10 MEQ tablet Take 40 mEq by mouth 2 (two) times daily.   Yes [provider]  rOPINIRole (REQUIP) 0.25 MG tablet Take 0.25 mg by mouth 3 (three) times daily.   Yes [provider]  sertraline (ZOLOFT) 100 MG tablet Take 100 mg by mouth daily. 07/12/21  Yes [provider]  Past Surgical History Past Surgical History:  Procedure Laterality Date   APPENDECTOMY     BREAST LUMPECTOMY Left    CHOLECYSTECTOMY     JOINT REPLACEMENT     bilat knee    REPLACEMENT TOTAL KNEE BILATERAL Bilateral    TONSILLECTOMY     Family History Family History  Problem Relation Age of Onset   Cancer Mother        lung   Arthritis Mother    Heart disease Father        endocarditis   Cancer Brother        lung, throat   Alcohol abuse Brother     Social History Social History   Tobacco Use   Smoking status: Never   Smokeless tobacco: Never  Vaping Use   Vaping Use: Never used  Substance Use Topics   Alcohol use: No    Alcohol/week: 0.0 standard drinks of alcohol   Drug use: No   Allergies Diltiazem hcl, Duloxetine hcl, and Ace inhibitors  Review of Systems Review of Systems  Constitutional:  Positive for appetite change. Negative for activity change and fever.  HENT:  Negative for facial swelling and trouble swallowing.   Eyes:  Negative for discharge and redness.  Respiratory:  Positive for cough.  Negative for shortness of breath.   Cardiovascular:  Negative for chest pain and palpitations.  Gastrointestinal:  Negative for abdominal pain and nausea.  Genitourinary:  Positive for dysuria. Negative for flank pain.  Musculoskeletal:  Negative for back pain and gait problem.  Skin:  Negative for pallor and rash.  Neurological:  Negative for syncope and headaches.    Physical Exam Vital Signs  I have reviewed the triage vital signs BP (!) 137/52   Pulse 60   Temp 98.7 F (37.1 C) (Oral)   Resp (!) 26   SpO2 94%  Physical Exam Vitals and nursing note reviewed.  Constitutional:      General: She is not in acute distress.    Appearance: Normal appearance. She is well-developed.  HENT:     Head: Normocephalic and atraumatic.     Right Ear: External ear normal.     Left Ear: External ear normal.     Nose: Nose normal.     Mouth/Throat:     Mouth: Mucous membranes are moist.  Eyes:     General: No scleral icterus.       Right eye: No discharge.        Left eye: No discharge.  Cardiovascular:     Rate and Rhythm: Normal rate and regular rhythm.     Pulses: Normal pulses.     Heart sounds: Normal heart sounds.  Pulmonary:     Effort: Pulmonary effort is normal. Tachypnea present. No respiratory distress.     Breath sounds: Decreased breath sounds and wheezing present.     Comments: Wheezing diffusely, adventious breath sounds b/l  Abdominal:     General: Abdomen is flat.     Palpations: Abdomen is soft.     Tenderness: There is no abdominal tenderness. There is no guarding or rebound.     Comments: Reducible peri-umbilical hernia  Musculoskeletal:        General: Normal range of motion.     Cervical back: Normal range of motion.     Right lower leg: Edema (trace) present.     Left lower leg: Edema (trace) present.  Skin:    General: Skin is warm and dry.  Capillary Refill: Capillary refill takes less than 2 seconds.  Neurological:     Mental Status: She is alert  and oriented to person, place, and time.     GCS: GCS eye subscore is 4. GCS verbal subscore is 5. GCS motor subscore is 6.  Psychiatric:        Mood and Affect: Mood normal.        Behavior: Behavior normal.     ED Results and Treatments Labs (all labs ordered are listed, but only abnormal results are displayed) Labs Reviewed  BASIC METABOLIC PANEL - Abnormal; Notable for the following components:      Result Value   Glucose, Bld 128 (*)    BUN 35 (*)    Creatinine, Ser 1.36 (*)    Calcium 8.3 (*)    GFR, Estimated 36 (*)    All other components within normal limits  BRAIN NATRIURETIC PEPTIDE - Abnormal; Notable for the following components:   B Natriuretic Peptide 421.0 (*)    All other components within normal limits  CBC WITH DIFFERENTIAL/PLATELET - Abnormal; Notable for the following components:   Hemoglobin 10.2 (*)    HCT 34.9 (*)    MCH 23.8 (*)    MCHC 29.2 (*)    RDW 17.9 (*)    Monocytes Absolute 1.6 (*)    All other components within normal limits  TROPONIN I (HIGH SENSITIVITY) - Abnormal; Notable for the following components:   Troponin I (High Sensitivity) 110 (*)    All other components within normal limits  RESP PANEL BY RT-PCR (RSV, FLU A&B, COVID)  RVPGX2  BLOOD GAS, VENOUS  URINALYSIS, ROUTINE W REFLEX MICROSCOPIC  TROPONIN I (HIGH SENSITIVITY)                                                                                                                          Radiology DG Chest Port 1 View  Result Date: 05/08/2022 CLINICAL DATA:  Shortness of breath EXAM: PORTABLE CHEST 1 VIEW COMPARISON:  CT 04/09/2020.  X-ray 07/13/2019 and older FINDINGS: Who was rotated to the left. Diffuse interstitial changes are seen which is likely chronic. No consolidation, pneumothorax or effusion. Borderline cardiopericardial silhouette. Calcified tortuous aorta. Overlapping cardiac leads. Left axillary surgical clips. Degenerative changes of both shoulders. IMPRESSION:  Rotated radiograph with chronic lung changes. Borderline size heart and similar to previous. Electronically Signed   By: Jill Side M.D.   On: 05/08/2022 14:09    Pertinent labs & imaging results that were available during my care of the patient were reviewed by me and considered in my medical decision making (see MDM for details).  Medications Ordered in ED Medications  ipratropium-albuterol (DUONEB) 0.5-2.5 (3) MG/3ML nebulizer solution 3 mL (3 mLs Nebulization Given 05/08/22 1353)  aspirin chewable tablet 324 mg (324 mg Oral Given 05/08/22 1442)  Procedures .Critical Care  Performed by: Jeanell Sparrow, DO Authorized by: Jeanell Sparrow, DO   Critical care provider statement:    Critical care time (minutes):  30   Critical care time was exclusive of:  Separately billable procedures and treating other patients   Critical care was necessary to treat or prevent imminent or life-threatening deterioration of the following conditions:  Respiratory failure   Critical care was time spent personally by me on the following activities:  Development of treatment plan with patient or surrogate, discussions with consultants, evaluation of patient's response to treatment, examination of patient, ordering and review of laboratory studies, ordering and review of radiographic studies, ordering and performing treatments and interventions, pulse oximetry, re-evaluation of patient's condition, review of old charts and obtaining history from patient or surrogate   (including critical care time)  Medical Decision Making / ED Course   MDM:  [name] is a 89 y.o. female with past medical history as below, significant for afib, DM, HLD, HTN, cva who presents to the ED with complaint of dib, cough, dysuria. . The complaint involves an extensive differential diagnosis and also  carries with it a high risk of complications and morbidity.  Serious etiology was considered. Ddx includes but is not limited to: In my evaluation of this patient's dyspnea my DDx includes, but is not limited to, pneumonia, pulmonary embolism, pneumothorax, pulmonary edema, metabolic acidosis, asthma, COPD, cardiac cause, anemia, anxiety,  etc.    On initial assessment the patient is: wheezing audibly, pulse ox 92% on RA, conversational dyspnea noted. Tachypnea. Will give duoneb, steroids. Check screening labs and cxr. Pt reports she was dx with covid 2 weeks ago.  Vital signs and nursing notes were reviewed  Clinical Course as of 05/08/22 1604  Thu May 08, 2022  1459 Creatinine(!): 1.36 AKI, baseline <1 [SG]    Clinical Course User Index [SG] Wynona Dove A, DO   Pt hypoxic on ambient air, down to 84-85% on ambient air, on 2L [place] breathing better, at 97%. Wheezing improved after duoneb. Continues to require supplemental oxygen. RVP is neg. VBG wnl. Trop is elev but EKG is w/o acute ischemic changes, no current chest pain. Given asa. Concern for demand ischemia in setting of hypoxia. Cr elev from prior, concern for AKI. She has elev BNP, trace edema in her legs but no sig pulm edema.  No home oxygen per pt  Pt pending delta trop and CTPE at shift change, anticipate admission given hypoxia, aki, elev trop.          Additional history obtained: -Additional history obtained from ems -External records from outside source obtained and reviewed including: Chart review including previous notes, labs, imaging, consultation notes including primary care documentation, prior labs/imaging/home meds >Hernia repair 1/17 pending  Dr Georgette Dover   Lab Tests: -I ordered, reviewed, and interpreted labs.   The pertinent results include:   Labs Reviewed  BASIC METABOLIC PANEL - Abnormal; Notable for the following components:      Result Value   Glucose, Bld 128 (*)    BUN 35 (*)    Creatinine, Ser 1.36  (*)    Calcium 8.3 (*)    GFR, Estimated 36 (*)    All other components within normal limits  BRAIN NATRIURETIC PEPTIDE - Abnormal; Notable for the following components:   B Natriuretic Peptide 421.0 (*)    All other components within normal limits  CBC WITH DIFFERENTIAL/PLATELET - Abnormal; Notable for the following components:  Hemoglobin 10.2 (*)    HCT 34.9 (*)    MCH 23.8 (*)    MCHC 29.2 (*)    RDW 17.9 (*)    Monocytes Absolute 1.6 (*)    All other components within normal limits  TROPONIN I (HIGH SENSITIVITY) - Abnormal; Notable for the following components:   Troponin I (High Sensitivity) 110 (*)    All other components within normal limits  RESP PANEL BY RT-PCR (RSV, FLU A&B, COVID)  RVPGX2  BLOOD GAS, VENOUS  URINALYSIS, ROUTINE W REFLEX MICROSCOPIC  TROPONIN I (HIGH SENSITIVITY)    Notable for trop elev >100, EKG wnl, Cr elev aki  EKG   EKG Interpretation  Date/Time:  Thursday May 08 2022 14:41:35 EST Ventricular Rate:  74 PR Interval:  40 QRS Duration: 94 QT Interval:  483 QTC Calculation: 536 R Axis:   58 Text Interpretation: Atrial fibrillation Borderline T abnormalities, diffuse leads Prolonged QT interval Confirmed by Aletta Edouard [phone]) on 05/08/2022 3:45:54 PM         Imaging Studies ordered: I ordered imaging studies including CXR, CTPE I independently visualized the following imaging with scope of interpretation limited to determining acute life threatening conditions related to emergency care: CXR, which revealed no acute process, CTPE pending I independently visualized and interpreted imaging. I agree with the radiologist interpretation   Medicines ordered and prescription drug management: Meds ordered this encounter  Medications   ipratropium-albuterol (DUONEB) 0.5-2.5 (3) MG/3ML nebulizer solution 3 mL   aspirin chewable tablet 324 mg    -I have reviewed the patients home medicines and have made adjustments as  needed   Consultations Obtained: I requested consultation with the na,  and discussed lab and imaging findings as well as pertinent plan - they recommend: na   Cardiac Monitoring: The patient was maintained on a cardiac monitor.  I personally viewed and interpreted the cardiac monitored which showed an underlying rhythm of: afib rate controlled   Social Determinants of Health:  Diagnosis or treatment significantly limited by social determinants of health: nursing home   Reevaluation: After the interventions noted above, I reevaluated the patient and found that they have improved  Co morbidities that complicate the patient evaluation  Past Medical History:  Diagnosis Date   Arthritis    Atrial fibrillation ([place])    Cancer ([place])    breast   Cataract    Diabetes mellitus without complication ([place])    Frequent UTI    Hyperlipidemia    Hypertension    Neck pain    Pneumonia    Scoliosis    Stroke ([place])       Dispostion: Disposition decision including need for hospitalization was considered, and patient dispo pending     Final Clinical Impression(s) / ED Diagnoses Final diagnoses:  Hypoxia  Elevated troponin  AKI (acute kidney injury) ([place])     This chart was dictated using voice recognition software.  Despite best efforts to proofread,  errors can occur which can change the documentation meaning.    Jeanell Sparrow, DO 05/08/22 [place], [place], DO 05/08/22 1605

## 2022-05-08 NOTE — Progress Notes (Signed)
PHARMACIST - PHYSICIAN ORDER COMMUNICATION  CONCERNING: P&T Medication Policy on Herbal Medications  DESCRIPTION:  This patient's order for:  Apoaequorin  has been noted.  This product(s) is classified as an "herbal" or natural product. Due to a lack of definitive safety studies or FDA approval, nonstandard manufacturing practices, plus the potential risk of unknown drug-drug interactions while on inpatient medications, the Pharmacy and Therapeutics Committee does not permit the use of "herbal" or natural products of this type within [facility].   ACTION TAKEN: The pharmacy department is unable to verify this order at this time and your patient has been informed of this safety policy. Please reevaluate patient's clinical condition at discharge and address if the herbal or natural product(s) should be resumed at that time.

## 2022-05-08 NOTE — ED Provider Notes (Signed)
Signout from Dr. Pearline Cables.  89 year old female here with shortness of breath cough dysuria.  Nursing home resident recently diagnosed with pneumonia.  Here was 85% on room air requiring nasal cannula oxygen new for patient.  She has elevated troponins, chest x-ray without clear infiltrate.  COVID and flu negative.  She is pending CT angio chest and will need admission to the hospital for further management. Physical Exam  BP (!) 137/52   Pulse 60   Temp 98.7 F (37.1 C) (Oral)   Resp (!) 26   SpO2 94%   Physical Exam  Procedures  Procedures  ED Course / MDM   Clinical Course as of 05/08/22 1618  Thu May 08, 2022  1459 Creatinine(!): 1.36 AKI, baseline <1 [SG]    Clinical Course User Index [SG] Jeanell Sparrow, DO   Medical Decision Making Amount and/or Complexity of Data Reviewed Labs: ordered. Decision-making details documented in ED Course. Radiology: ordered.  Risk OTC drugs. Prescription drug management. Decision regarding hospitalization.   CT does not show any acute PE.  Discussed with hospitalist regarding admission for hypoxia.       Hayden Rasmussen, MD 05/09/22 1006

## 2022-05-08 NOTE — H&P (Addendum)
TRH H&P   Patient Demographics:    [name], is a 89 y.o. female  MRN: [id number]   DOB - 04/15/1926  Admit Date - 05/08/2022  Outpatient Primary MD for the patient is Jodi Marble, MD  Referring MD/NP/PA: DR  Melina Copa     [place] [facility]  Chief Complaint  Patient presents with   Shortness of Breath      HPI:    [name]  is a 89 y.o. female  with a h/o CVA, atrial fibrillation (no AC due to age and fall risk), HLD, moderate carotid artery disease, pulmonary fibrosis, diastolic CHF, and breast cancer . -Patient was sent to ED for evaluation of shortness of breath, per SNF she was recently started on doxycycline to treat pneumonia, she has been reporting cough, shortness of breath, she does not ambulate at baseline secondary to prior knee injury from fall, she is not on any oxygen requirement, she was noted to have oxygen saturation 85% on room air by EMS, this has improved to 96% on 2 L nasal cannula, currently she denies any fever, chills, nausea or vomiting, but she does report poor appetite, she does report intermittent dysuria as well over the last few weeks, she has umbilical hernia, and she is scheduled to have repair as an outpatient, reports last BM was yesterday, denies any diarrhea or constipation. -In ED her workup significant for creatinine of 1.36, initial troponin 110, repeat is 90, BNP mildly elevated at 421, hemoglobin at baseline at 10.2 her respiratory panel including COVID, RSV and flu is negative, CTA chest was obtained which was significant for peribronchial thickening, and ground glass opacity in lower lung with some surrounding lymphadenopathy, given her hypoxia, [facility] hospitalist consulted to admit.  Review of systems:     A full 10 point Review of Systems was done, except as stated above, all other Review of Systems were  negative.   With Past History of the following :    Past Medical History:  Diagnosis Date   Arthritis    Atrial fibrillation ([place])    Cancer ([place])    breast   Cataract    Diabetes mellitus without complication ([place])    Frequent UTI    Hyperlipidemia    Hypertension    Neck pain    Pneumonia    Scoliosis    Stroke [facility])       Past Surgical History:  Procedure Laterality Date   APPENDECTOMY     BREAST LUMPECTOMY Left    CHOLECYSTECTOMY     JOINT REPLACEMENT     bilat knee    REPLACEMENT TOTAL KNEE BILATERAL Bilateral    TONSILLECTOMY        Social History:     Social History   Tobacco Use   Smoking status: Never   Smokeless tobacco: Never  Substance Use Topics   Alcohol use:  No    Alcohol/week: 0.0 standard drinks of alcohol       Family History :     Family History  Problem Relation Age of Onset   Cancer Mother        lung   Arthritis Mother    Heart disease Father        endocarditis   Cancer Brother        lung, throat   Alcohol abuse Brother       Home Medications:   Prior to Admission medications   Medication Sig Start Date End Date Taking? Authorizing Provider  acetaminophen (TYLENOL) 325 MG tablet Take 650 mg by mouth every 6 (six) hours as needed (pain).   Yes [provider]  Apoaequorin (PREVAGEN EXTRA STRENGTH) 20 MG CAPS Take 20 mg by mouth daily.   Yes [provider]  atorvastatin (LIPITOR) 80 MG tablet Take 1 tablet (80 mg total) by mouth at bedtime. 02/21/20  Yes Ivy Lynn, NP  celecoxib (CELEBREX) 100 MG capsule Take 100 mg by mouth 2 (two) times daily.   Yes [provider]  Cholecalciferol (VITAMIN D3) 50 MCG (2000 UT) TABS Take 2,000 Units by mouth in the morning.   Yes [provider]  cloNIDine (CATAPRES) 0.1 MG tablet Take 0.1 mg by mouth 2 (two) times daily.   Yes [provider]  D-Mannose 500 MG CAPS Take 2,000 mg by mouth daily.   Yes [provider]   docusate sodium (COLACE) 100 MG capsule Take 100 mg by mouth daily.   Yes [provider]  doxycycline (VIBRA-TABS) 100 MG tablet Take 100 mg by mouth 2 (two) times daily. 05/07/22  Yes [provider]  estradiol (ESTRACE) 0.1 MG/GM vaginal cream Place 1 Applicatorful vaginally See admin instructions. Apply 0.5g at bedtime on Mondays and Thursdays only.   Yes [provider]  fluticasone (FLONASE) 50 MCG/ACT nasal spray Place 1 spray into both nostrils daily.   Yes [provider]  hydrALAZINE (APRESOLINE) 25 MG tablet Take 1 tablet (25 mg total) by mouth 3 (three) times daily. 08/05/21  Yes Marylu Lund., NP  hydrochlorothiazide (HYDRODIURIL) 25 MG tablet Take 50 mg by mouth daily. 07/15/21  Yes [provider]  HYDROcodone-acetaminophen (NORCO/VICODIN) 5-325 MG tablet Take 1 tablet by mouth See admin instructions. Take 1 tablet every morning. Take 1 tablet every 8 hours as needed for pain.   Yes [provider]  ipratropium-albuterol (DUONEB) 0.5-2.5 (3) MG/3ML SOLN Take 3 mLs by nebulization every 6 (six) hours as needed (wheezing/SOB).   Yes [provider]  LEVEMIR FLEXTOUCH 100 UNIT/ML FlexTouch Pen Inject 6-9 Units into the skin See admin instructions. Inject 9 units in the morning and 6 units at bedtime. 07/14/21  Yes [provider]  Magnesium 250 MG TABS Take 250 mg by mouth daily.   Yes [provider]  metFORMIN (GLUCOPHAGE) 500 MG tablet Take 1 tablet (500 mg total) by mouth daily with breakfast. 02/20/20  Yes Ivy Lynn, NP  mirtazapine (REMERON) 30 MG tablet Take 1 tablet by mouth at bedtime. 07/14/21  Yes [provider]  NOVOLOG FLEXPEN 100 UNIT/ML FlexPen Inject 0-10 Units into the skin See admin instructions. Sliding scale: CBG 1 to 150=0 units, 151-200=2 units, 201-250=4 units, 251-300=6 units, 301-350=8 units, 351-400=10 units >400 call Dr < 70 call Dr 09/14/19  Yes [provider]   ondansetron (ZOFRAN) 4 MG tablet Take 1 tablet (4 mg total) by mouth  every 8 (eight) hours as needed for nausea or vomiting. 02/20/20  Yes Ivy Lynn, NP  polyethylene glycol (MIRALAX / GLYCOLAX) 17 g packet Take 17 g by mouth daily as needed (constipation).   Yes [provider]  potassium chloride (KLOR-CON M) 10 MEQ tablet Take 40 mEq by mouth 2 (two) times daily.   Yes [provider]  rOPINIRole (REQUIP) 0.25 MG tablet Take 0.25 mg by mouth 3 (three) times daily.   Yes [provider]  sertraline (ZOLOFT) 100 MG tablet Take 100 mg by mouth daily. 07/12/21  Yes [provider]     Allergies:     Allergies  Allergen Reactions   Diltiazem Hcl Itching, Rash and Other (See Comments)    Red itchy rash started a few hours after taking short acting '120mg'$  dilt tabs    Duloxetine Hcl Anxiety, Rash and Other (See Comments)    Makes patient feel "restless and scared" and unable to sleep   Ace Inhibitors Cough     Physical Exam:   Vitals  Blood pressure (!) 133/53, pulse (!) 58, temperature 98.7 F (37.1 C), temperature source Oral, resp. rate (!) 22, SpO2 91 %.   1. General frail elderly female, laying in bed, no apparent distress  2. Normal affect and insight, Not Suicidal or Homicidal, Awake Alert, Oriented X 3.  3. No F.N deficits, ALL C.Nerves Intact, Strength 5/5 all 4 extremities, Sensation intact all 4 extremities, Plantars down going.  4. Ears and Eyes appear Normal, Conjunctivae clear, PERRLA. Moist Oral Mucosa.  5. Supple Neck, No JVD, No cervical lymphadenopathy appriciated, No Carotid Bruits.  6. Symmetrical Chest wall movement, Good air movement bilaterally, scattered Rales  7.  Irregular, No Gallops, Rubs or Murmurs, No Parasternal Heave.  8. Positive Bowel Sounds, Abdomen Soft, No tenderness, No organomegaly appriciated,No rebound -guarding or rigidity.  9.  No Cyanosis, Normal Skin Turgor, No Skin Rash or Bruise.  10.  Good muscle tone,  joints appear normal , no effusions, Normal ROM.    Data Review:    CBC Recent Labs  Lab 05/08/22 1327  WBC 7.8  HGB 10.2*  HCT 34.9*  PLT 163  MCV 81.4  MCH 23.8*  MCHC 29.2*  RDW 17.9*  LYMPHSABS 1.3  MONOABS 1.6*  EOSABS 0.2  BASOSABS 0.0   ------------------------------------------------------------------------------------------------------------------  Chemistries  Recent Labs  Lab 05/08/22 1327  NA 137  K 4.1  CL 104  CO2 23  GLUCOSE 128*  BUN 35*  CREATININE 1.36*  CALCIUM 8.3*   ------------------------------------------------------------------------------------------------------------------ CrCl cannot be calculated (Unknown ideal weight.). ------------------------------------------------------------------------------------------------------------------ No results for input(s): "TSH", "T4TOTAL", "T3FREE", "THYROIDAB" in the last 72 hours.  Invalid input(s): "FREET3"  Coagulation profile No results for input(s): "INR", "PROTIME" in the last 168 hours. ------------------------------------------------------------------------------------------------------------------- No results for input(s): "DDIMER" in the last 72 hours. -------------------------------------------------------------------------------------------------------------------  Cardiac Enzymes No results for input(s): "CKMB", "TROPONINI", "MYOGLOBIN" in the last 168 hours.  Invalid input(s): "CK" ------------------------------------------------------------------------------------------------------------------    Component Value Date/Time   BNP 421.0 (H) 05/08/2022 1327     ---------------------------------------------------------------------------------------------------------------  Urinalysis    Component Value Date/Time   COLORURINE YELLOW 10/24/2020 1241   APPEARANCEUR HAZY (A) 10/24/2020 1241   APPEARANCEUR Cloudy (A) 02/24/2020 0908   LABSPEC 1.012 10/24/2020  1241   PHURINE 7.0 10/24/2020 1241   GLUCOSEU NEGATIVE 10/24/2020 1241   HGBUR NEGATIVE 10/24/2020 1241   BILIRUBINUR NEGATIVE 10/24/2020 1241   BILIRUBINUR Negative 02/24/2020 0908   KETONESUR NEGATIVE 10/24/2020 1241   PROTEINUR  NEGATIVE 10/24/2020 1241   UROBILINOGEN negative 07/03/2015 1351   UROBILINOGEN 1.0 05/14/2007 1320   NITRITE NEGATIVE 10/24/2020 1241   LEUKOCYTESUR SMALL (A) 10/24/2020 1241    ----------------------------------------------------------------------------------------------------------------   Imaging Results:    CT Angio Chest PE W and/or Wo Contrast  Result Date: 05/08/2022 CLINICAL DATA:  Shortness of breath and cough. Recent history of pneumonia EXAM: CT ANGIOGRAPHY CHEST WITH CONTRAST TECHNIQUE: Multidetector CT imaging of the chest was performed using the standard protocol during bolus administration of intravenous contrast. Multiplanar CT image reconstructions and MIPs were obtained to evaluate the vascular anatomy. RADIATION DOSE REDUCTION: This exam was performed according to the departmental dose-optimization program which includes automated exposure control, adjustment of the mA and/or kV according to patient size and/or use of iterative reconstruction technique. CONTRAST:  60m OMNIPAQUE IOHEXOL 350 MG/ML SOLN COMPARISON:  X-ray 05/08/2022.  CT scan 04/09/2020. FINDINGS: Cardiovascular: Enlarged heart. The thoracic aorta otherwise normal course and caliber with diffuse vascular calcifications and atherosclerotic change. Coronary artery calcifications are also seen. No significant pericardial effusion. Motion is seen throughout the examination which can limit evaluation of small peripheral emboli. There is some enlargement of the pulmonary arterial tree. Please correlate for evidence of pulmonary hypertension. No segmental or larger pulmonary embolism identified. Mediastinum/Nodes: No specific abnormal lymph node enlargement present in the axillary region.  There is an enlarged left hilar lymph node on series 4, image 38 measuring 16 x 14 mm which is new from prior CT scan. No significant right hilar lymph node enlargement. Some prominent mediastinal nodes are seen. Pretracheal node on image 29 of series 4 measures 15 by 13 mm. On the prior study this node would have measured 13 by 9 mm. Normal caliber thoracic esophagus which is slightly patulous. Small hiatal hernia. Small thyroid gland. Lungs/Pleura: Breathing motion seen throughout the examination. There is some dependent areas of nonspecific ground-glass along both lungs diffusely with some interstitial septal thickening. Bronchiectasis noted in the lower lobes with some areas opacity most circum bronchi. Prominent calcifications diffusely of the tracheobronchial tree. No areas of consolidation at this time. Upper Abdomen: Along the upper abdomen the adrenal glands are preserved. Gallbladder is present. There is a lobular appearance to the left kidney at the edge of the imaging field on the very last image. This could be technical although lesion is not excluded recommend dedicated workup when appropriate just a contrast CT of the abdomen and pelvis. Musculoskeletal: Diffuse degenerative changes are seen including the shoulders and spine. Osteopenia. Slight curvature of the spine as well. Review of the MIP images confirms the above findings. IMPRESSION: Breathing motion. No segmental or larger pulmonary embolism identified. There is some enlargement of the pulmonary arteries which can be seen with pulmonary hypertension. Diffuse areas peribronchial thickening and opacity along areas of bronchi particularly in the lower lobes. Peripheral areas of interstitial changes and ground-glass. Development of some mildly enlarged mediastinal and left hilar lymph nodes of uncertain etiology and significance. Recommend further workup or short follow-up. Lobular appearance to the left kidney at the edge of the imaging field  could represent volume averaging on the chest CT however recommend confirmatory study to exclude mass lesion when clinically appropriate Aortic Atherosclerosis (ICD10-I70.0) and Emphysema (ICD10-J43.9). Electronically Signed   By: AJill SideM.D.   On: 05/08/2022 17:29   DG Chest Port 1 View  Result Date: 05/08/2022 CLINICAL DATA:  Shortness of breath EXAM: PORTABLE CHEST 1 VIEW COMPARISON:  CT 04/09/2020.  X-ray 07/13/2019 and older FINDINGS: Who  was rotated to the left. Diffuse interstitial changes are seen which is likely chronic. No consolidation, pneumothorax or effusion. Borderline cardiopericardial silhouette. Calcified tortuous aorta. Overlapping cardiac leads. Left axillary surgical clips. Degenerative changes of both shoulders. IMPRESSION: Rotated radiograph with chronic lung changes. Borderline size heart and similar to previous. Electronically Signed   By: Jill Side M.D.   On: 05/08/2022 14:09     EKG PR interval * ms QRS duration 94 ms QT/QTcB 483/536 ms P-R-T axes * 58 265 Atrial fibrillation Borderline T abnormalities, diffuse leads Prolonged QT interval  Assessment & Plan:    Principal Problem:   Acute respiratory failure with hypoxia (HCC) Active Problems:   Hyperlipidemia associated with type 2 diabetes mellitus ([place])   Hypertension associated with diabetes ([place])   Pulmonary fibrosis (HCC)   GERD   Permanent atrial fibrillation (HCC)   Chronic diastolic CHF (congestive heart failure) (HCC)   Type 2 diabetes mellitus with diabetic neuropathy, unspecified (HCC)  Acute hypoxic respiratory failure Pneumonia -Patient hypoxic 85% on room air, currently quiring 2 L nasal cannula, this is secondary to bibasilar pneumonia -Will end up pneumonia pathway, follow on Legionella antigen, strep pneumonia antigen, and sputum cultures -Will check respiratory viral panel -Will continue with IV doxycycline (avoiding azithromycin given prolonged QTc), will start on IV Rocephin as  well -She was encouraged to use incentive spirometry as well  Prolonged Qtc -Avoid prolonging agents  Chronic atrial fibrillation. -Heart rate is controlled, -Anticoagulation has been discontinued secondary to multiple falls in the past, not a candidate for anticoagulation due to falls.     Hypertension -Continue with home medications patient was resumed on her oral antihypertensives, achieving good blood pressure control.   Type 2 diabetes mellitus -She is on low-dose Levemir, will hold during hospital stay, will hold metformin as well, will keep an insulin sliding scale   Hyperlipidemia  -continue with home dose statin  Lobular appearance to the left kidney at the edge of the imaging field  - per radiology could represent volume averaging on the chest CT however they  recommend confirmatory study to exclude mass lesion when clinically appropriate.   DVT Prophylaxis Heparin   AM Labs Ordered, also please review Full Orders  Family Communication: Admission, patients condition and plan of care including tests being ordered have been discussed with the patient and son Louie Casa by phone  who indicate understanding and agree with the plan and Code Status.  Code Status DNR/Has DNR form in the chart  Likely DC to  back to SNF  Condition GUARDED   Consults called: None    Admission status: inpatient    Time spent in minutes : 65 minutes   Phillips Climes M.D on 05/08/2022 at 5:52 PM   [facility] - Office  [phone]

## 2022-05-08 NOTE — ED Notes (Signed)
With hourly rounding noted that pt O2 sats were 85% on 2L via [place], oxygen increased to 4L, instructed pt to take deep breaths in nose and out of mouth, O2 sats came up to 94% within one minute.

## 2022-05-09 DIAGNOSIS — J9601 Acute respiratory failure with hypoxia: Secondary | ICD-10-CM | POA: Diagnosis not present

## 2022-05-09 LAB — BASIC METABOLIC PANEL
Anion gap: 9 (ref 5–15)
BUN: 32 mg/dL — ABNORMAL HIGH (ref 8–23)
CO2: 25 mmol/L (ref 22–32)
Calcium: 8.2 mg/dL — ABNORMAL LOW (ref 8.9–10.3)
Chloride: 104 mmol/L (ref 98–111)
Creatinine, Ser: 0.95 mg/dL (ref 0.44–1.00)
GFR, Estimated: 56 mL/min — ABNORMAL LOW (ref >60)
Glucose, Bld: 83 mg/dL (ref 70–99)
Potassium: 3.2 mmol/L — ABNORMAL LOW (ref 3.5–5.1)
Sodium: 138 mmol/L (ref 135–145)

## 2022-05-09 LAB — CBC
HCT: 35.9 % — ABNORMAL LOW (ref 36.0–46.0)
Hemoglobin: 10.5 g/dL — ABNORMAL LOW (ref 12.0–15.0)
MCH: 24 pg — ABNORMAL LOW (ref 26.0–34.0)
MCHC: 29.2 g/dL — ABNORMAL LOW (ref 30.0–36.0)
MCV: 82.2 fL (ref 80.0–100.0)
Platelets: 154 10*3/uL (ref 150–400)
RBC: 4.37 MIL/uL (ref 3.87–5.11)
RDW: 18 % — ABNORMAL HIGH (ref 11.5–15.5)
WBC: 6.3 10*3/uL (ref 4.0–10.5)
nRBC: 0 % (ref 0.0–0.2)

## 2022-05-09 LAB — GLUCOSE, CAPILLARY
Glucose-Capillary: 114 mg/dL — ABNORMAL HIGH (ref 70–99)
Glucose-Capillary: 161 mg/dL — ABNORMAL HIGH (ref 70–99)

## 2022-05-09 LAB — PROCALCITONIN: Procalcitonin: 8.83 ng/mL

## 2022-05-09 LAB — HEMOGLOBIN A1C
Hgb A1c MFr Bld: 6.3 % — ABNORMAL HIGH (ref 4.8–5.6)
Mean Plasma Glucose: 134 mg/dL

## 2022-05-09 LAB — STREP PNEUMONIAE URINARY ANTIGEN: Strep Pneumo Urinary Antigen: NEGATIVE

## 2022-05-09 MED ORDER — POTASSIUM CHLORIDE 20 MEQ PO PACK
40.0000 meq | PACK | Freq: Once | ORAL | Status: AC
  Administered 2022-05-09: 40 meq via ORAL
  Filled 2022-05-09: qty 2

## 2022-05-09 NOTE — TOC Initial Note (Signed)
Transition of Care [facility]) - Initial/Assessment Note    Patient Details  Name: [name] MRN: [id number] Date of Birth: 05-11-1925  Transition of Care [facility]) CM/SW Contact:    Iona Beard, [place] Phone Number: 05/09/2022, 11:11 AM  Clinical Narrative:                 CSW updated that pt is a resident at [facility]. CSW spoke with pts son [name] who confirms plan for return to [facility]. CSW spoke to Talbert Forest in admissions at [facility] who states once medically stable pt can D/C back to LTC facility. Fl2 has been started. TOC to follow.   Expected Discharge Plan: Long Term Nursing Home Barriers to Discharge: Continued Medical Work up   Patient Goals and CMS Choice Patient states their goals for this hospitalization and ongoing recovery are:: return to LTC CMS Medicare.gov Compare Post Acute Care list provided to:: Patient Represenative (must comment) Choice offered to / list presented to : Patient, Adult Children      Expected Discharge Plan and Services In-house Referral: Clinical Social Work Discharge Planning Services: CM Consult Post Acute Care Choice: Nursing Home Living arrangements for the past 2 months: [place]                                      Prior Living Arrangements/Services Living arrangements for the past 2 months: [place] Lives with:: Facility Resident Patient language and need for interpreter reviewed:: Yes Do you feel safe going back to the place where you live?: Yes      Need for Family Participation in Patient Care: Yes (Comment) Care giver support system in place?: Yes (comment)   Criminal Activity/Legal Involvement Pertinent to Current Situation/Hospitalization: No - Comment as needed  Activities of Daily Living Home Assistive Devices/Equipment: Environmental consultant (specify type), Wheelchair ADL Screening (condition at time of admission) Patient's cognitive ability adequate to safely complete daily  activities?: Yes Is the patient deaf or have difficulty hearing?: No Does the patient have difficulty seeing, even when wearing glasses/contacts?: No Does the patient have difficulty concentrating, remembering, or making decisions?: No Patient able to express need for assistance with ADLs?: Yes Does the patient have difficulty dressing or bathing?: No Independently performs ADLs?: Yes (appropriate for developmental age) Does the patient have difficulty walking or climbing stairs?: No Weakness of Legs: Both Weakness of Arms/Hands: None  Permission Sought/Granted                  Emotional Assessment Appearance:: Appears stated age       Alcohol / Substance Use: Not Applicable Psych Involvement: No (comment)  Admission diagnosis:  Hypoxia [R09.02] Elevated troponin [R79.89] Acute respiratory failure with hypoxia ([place]) [J96.01] AKI (acute kidney injury) ([place]) [N17.9] Patient Active Problem List   Diagnosis Date Noted   Acute respiratory failure with hypoxia ([place]) 05/08/2022   Acute postoperative pain of left knee 09/16/2021   Encounter to establish care 02/20/2020   Bilateral subdural hematomas ([place]) 07/16/2019   CHI (closed head injury), initial encounter 07/14/2019   Left radial head fracture 07/14/2019   SDH (subdural hematoma) ([place])    Hypertensive urgency    Subarachnoid hemorrhage following injury ([place]) 07/13/2019   Acute encephalopathy 06/27/2019   Heat stroke 06/27/2019   Hypokalemia 06/27/2019   Fever, unspecified 06/27/2019   Stroke ([place]) 06/27/2019   Altered mental status 06/26/2019   Right  hip pain 06/22/2019   Chronic left hip pain 05/25/2019   Depression, recurrent ([place]) 05/25/2019   Chronic anticoagulation 04/19/2019   Leg cramps 03/04/2019   Short-term memory loss 03/04/2019   DOE (dyspnea on exertion) 03/04/2019   Aortic atherosclerosis ([place]) 03/04/2019   Physical deconditioning 01/24/2019   History of recurrent UTIs 12/03/2018   Chronic pain of  right knee 10/08/2018   Type 2 diabetes mellitus with diabetic neuropathy, unspecified ([place]) 08/12/2018   Pulmonary vascular congestion    Chronic diastolic CHF (congestive heart failure) ([place])    Acute hypoxemic respiratory failure ([place]) 12/06/2017   Permanent atrial fibrillation ([place]) 12/06/2017   Trochanteric bursitis, right hip 07/16/2017   Other intervertebral disc degeneration, lumbar region 07/16/2017   Osteoporosis 11/04/2016   Diabetic polyneuropathy associated with type 2 diabetes mellitus ([place]) 11/14/2014   Fibromyalgia 02/11/2013   CVA (cerebral vascular accident) ([place]) 08/01/2008   Osteoarthritis 04/09/2007   Hyperlipidemia associated with type 2 diabetes mellitus ([place]) 03/08/2007   Hypertension associated with diabetes ([place]) 03/08/2007   Pulmonary fibrosis ([place]) 03/08/2007   GERD 03/08/2007   BREAST CANCER, HX OF 03/08/2007   PCP:  Jodi Marble, MD Pharmacy:   [place], [place] [address] [place] [address] Phone: [phone] Fax: [phone]  [facility] [place], [address] [place] [address] Phone: [phone] Fax: [phone]  [place], [place] [address] [place] [address] Phone: [phone] Fax: [phone]     Social Determinants of Health (SDOH) Social History: [place]: No Food Insecurity (05/09/2022)  Housing: Low Risk  (05/09/2022)  Transportation Needs: No Transportation Needs (05/09/2022)  Utilities: Not At Risk (05/09/2022)  Alcohol Screen: Low Risk  (07/16/2018)  Depression (PHQ2-9): Medium Risk (04/12/2021)  Financial Resource Strain: Low Risk  (07/16/2018)  Physical Activity: Inactive (04/12/2021)  Social Connections: Moderately Isolated (08/17/2018)  Stress: Stress Concern Present (04/12/2021)  Tobacco Use: Low Risk  (05/08/2022)   SDOH Interventions:      Readmission Risk Interventions    05/09/2022   11:10 AM  Readmission Risk Prevention Plan  Transportation Screening Complete  Home Care Screening Complete  Medication Review (RN CM) Complete

## 2022-05-09 NOTE — Care Management Important Message (Signed)
Important Message  Patient Details  Name: [name] MRN: [id number] Date of Birth: 1925/05/12   Medicare Important Message Given:  Other (see comment) (spoke with son, [name] at [phone] to review letter, no additional copy needed)     Tommy Medal 05/09/2022, 3:08 PM

## 2022-05-09 NOTE — Progress Notes (Signed)
Patient admitted to unit 200 at 0145 awake and alert, O2 at 4 lpm via n/c. Patient transferred from bed to [facility] with assist x 2 small BM, Patient SOB with exertion noted. Lying quietly no c/o pain or discomfort this shift.

## 2022-05-09 NOTE — Progress Notes (Signed)
PROGRESS NOTE    [name]  YTK:[id number] DOB: 1925/08/23 DOA: 05/08/2022 PCP: Jodi Marble, MD   Brief Narrative:  This 89 years old female with history of CVA, atrial fibrillation not on anticoagulation due to age and fall risk, hyperlipidemia, moderate carotid artery disease, pulmonary fibrosis, diastolic CHF, breast cancer presented in the ED for shortness of breath, chest congestion and productive cough.  Patient was started on doxycycline to treat pneumonia at nursing home without improvement.  She does not ambulate at baseline secondary to prior knee injury from fall.  She was noted to have hypoxia with SpO2 of 85% on room air with EMS which has improved to 96% on 2 L.  Patient also reports intermittent dysuria as well for last few weeks.  She also has umbilical hernia and she is scheduled to have repair as an outpatient. In the ED her workup is significant for BNP mildly elevated at 421, creatinine 1.36, troponin 110> 93, COVID, RSV and Influenza negative.  CTA chest was obtained which is significant for groundglass opacities in the left lower lung with surrounding lymphadenopathy.  Patient is admitted for further evaluation.  Assessment & Plan:   Principal Problem:   Acute respiratory failure with hypoxia (HCC) Active Problems:   Hyperlipidemia associated with type 2 diabetes mellitus ([facility])   Hypertension associated with diabetes ([place])   Pulmonary fibrosis (HCC)   GERD   Permanent atrial fibrillation (HCC)   Chronic diastolic CHF (congestive heart failure) (HCC)   Type 2 diabetes mellitus with diabetic neuropathy, unspecified (HCC)  Acute hypoxic respiratory failure secondary to CAP: Patient presented with productive cough, chest congestion and shortness of breath. She is found to have hypoxia with SpO2 of 85% on room air after EMS arrived requiring 2 L of supplemental oxygen to maintain saturation above 90%. CTA chest ruled out PE but shows findings consistent with  pneumonia. Empirically initiated on doxycycline and IV Rocephin. Procalcitonin 8.32 consistent with bacterial pneumonia Influenza, COVID, RSV negative. Follow-up Legionella and strep pneumo antigen and sputum cultures. Continue supplemental oxygen and wean as tolerated  Prolonged Qtc: Avoid QT prolonging medications Obtain repeat EKG.  Chronic atrial fibrillation: Heart rate is well-controlled.   Anticoagulation has been discontinued recently due to multiple falls,  Not candidate for anticoagulation.  Essential hypertension: Continue hydralazine, clonidine, hydrochlorothiazide  Type 2 diabetes: Hold p.o. diabetic medications Continue regular insulin sliding scale  Hyperlipidemia: Continue Lipitor  Abnormal imaging of left kidney: Radiologist recommended confirmatory study to exclude mass lesion when clinically appropriate  Depression/ Anxiety: Continue Remeron, Zoloft.  DVT prophylaxis: Heparin Code Status: DNR Family Communication: No family at bedside Disposition Plan:  Status is: Inpatient Remains inpatient appropriate because: Admitted for acute hypoxic respiratory failure secondary to community-acquired pneumonia requiring IV antibiotics and supplemental oxygen.    Consultants:  None  Procedures: CTA chest Antimicrobials:  Anti-infectives (From admission, onward)    Start     Dose/Rate Route Frequency Ordered Stop   05/08/22 1800  cefTRIAXone (ROCEPHIN) 2 g in sodium chloride 0.9 % 100 mL IVPB        2 g 200 mL/hr over 30 Minutes Intravenous Every 24 hours 05/08/22 1751     05/08/22 1800  doxycycline (VIBRAMYCIN) 100 mg in sodium chloride 0.9 % 250 mL IVPB        100 mg 125 mL/hr over 120 Minutes Intravenous Every 12 hours 05/08/22 1751        Subjective: Patient was seen and examined at bedside. Overnight events noted.  Patient reports doing much better, still reports having urinary burning, remains on 3 L of supplemental oxygen. At baseline, She does  not use oxygen.   Objective: Vitals:   05/09/22 0000 05/09/22 0100 05/09/22 0140 05/09/22 0144  BP: (!) 149/60 (!) 150/78 (!) 163/59 (!) 150/78  Pulse: (!) 54 61 (!) 51 61  Resp: (!) '21 14 16 14  '$ Temp:   98.1 F (36.7 C) 98.1 F (36.7 C)  TempSrc:   Oral Oral  SpO2: 98% 92% 100%   Weight:    58.2 kg  Height:    '5\' 3"'$  (1.6 m)    Intake/Output Summary (Last 24 hours) at 05/09/2022 1108 Last data filed at 05/08/2022 2314 Gross per 24 hour  Intake 349.38 ml  Output --  Net 349.38 ml   Filed Weights   05/09/22 0144  Weight: 58.2 kg    Examination:  General exam: Appears comfortable, not in any acute distress.  Deconditioned Respiratory system: CTA bilaterally, respiratory effort normal, RR 15. Cardiovascular system: S1 & S2 heard, regular rate and rhythm,  murmur +. Gastrointestinal system: Abdomen is soft, non tender, non distended, BS+ Central nervous system: Alert and oriented x 3. No focal neurological deficits. Extremities: No edema, no cyanosis, no clubbing. Skin: No rashes, lesions or ulcers Psychiatry: Judgement and insight appear normal. Mood & affect appropriate.     Data Reviewed: I have personally reviewed following labs and imaging studies  CBC: Recent Labs  Lab 05/08/22 1327 05/09/22 0447  WBC 7.8 6.3  NEUTROABS 4.6  --   HGB 10.2* 10.5*  HCT 34.9* 35.9*  MCV 81.4 82.2  PLT 163 341   Basic Metabolic Panel: Recent Labs  Lab 05/08/22 1327 05/09/22 0447  NA 137 138  K 4.1 3.2*  CL 104 104  CO2 23 25  GLUCOSE 128* 83  BUN 35* 32*  CREATININE 1.36* 0.95  CALCIUM 8.3* 8.2*   GFR: Estimated Creatinine Clearance: 30 mL/min (by C-G formula based on SCr of 0.95 mg/dL). Liver Function Tests: No results for input(s): "AST", "ALT", "ALKPHOS", "BILITOT", "PROT", "ALBUMIN" in the last 168 hours. No results for input(s): "LIPASE", "AMYLASE" in the last 168 hours. No results for input(s): "AMMONIA" in the last 168 hours. Coagulation Profile: No  results for input(s): "INR", "PROTIME" in the last 168 hours. Cardiac Enzymes: No results for input(s): "CKTOTAL", "CKMB", "CKMBINDEX", "TROPONINI" in the last 168 hours. BNP (last 3 results) No results for input(s): "PROBNP" in the last 8760 hours. HbA1C: Recent Labs    05/08/22 1852  HGBA1C 6.3*   CBG: Recent Labs  Lab 05/08/22 2127  GLUCAP 74   Lipid Profile: No results for input(s): "CHOL", "HDL", "LDLCALC", "TRIG", "CHOLHDL", "LDLDIRECT" in the last 72 hours. Thyroid Function Tests: No results for input(s): "TSH", "T4TOTAL", "FREET4", "T3FREE", "THYROIDAB" in the last 72 hours. Anemia Panel: No results for input(s): "VITAMINB12", "FOLATE", "FERRITIN", "TIBC", "IRON", "RETICCTPCT" in the last 72 hours. Sepsis Labs: Recent Labs  Lab 05/09/22 0910  PROCALCITON 8.83    Recent Results (from the past 240 hour(s))  Resp panel by RT-PCR (RSV, Flu A&B, Covid) Anterior Nasal Swab     Status: None   Collection Time: 05/08/22  1:43 PM   Specimen: Anterior Nasal Swab  Result Value Ref Range Status   SARS Coronavirus 2 by RT PCR NEGATIVE NEGATIVE Final    Comment: (NOTE) SARS-CoV-2 target nucleic acids are NOT DETECTED.  The SARS-CoV-2 RNA is generally detectable in upper respiratory specimens during the acute phase of  infection. The lowest concentration of SARS-CoV-2 viral copies this assay can detect is 138 copies/mL. A negative result does not preclude SARS-Cov-2 infection and should not be used as the sole basis for treatment or other patient management decisions. A negative result may occur with  improper specimen collection/handling, submission of specimen other than nasopharyngeal swab, presence of viral mutation(s) within the areas targeted by this assay, and inadequate number of viral copies(<138 copies/mL). A negative result must be combined with clinical observations, patient history, and epidemiological information. The expected result is Negative.  Fact Sheet  for Patients:  EntrepreneurPulse.com.au  Fact Sheet for Healthcare Providers:  IncredibleEmployment.be  This test is no t yet approved or cleared by the [place] FDA and  has been authorized for detection and/or diagnosis of SARS-CoV-2 by FDA under an Emergency Use Authorization (EUA). This EUA will remain  in effect (meaning this test can be used) for the duration of the COVID-19 declaration under Section 564(b)(1) of the Act, 21 U.S.C.section 360bbb-3(b)(1), unless the authorization is terminated  or revoked sooner.       Influenza A by PCR NEGATIVE NEGATIVE Final   Influenza B by PCR NEGATIVE NEGATIVE Final    Comment: (NOTE) The Xpert Xpress SARS-CoV-2/FLU/RSV plus assay is intended as an aid in the diagnosis of influenza from Nasopharyngeal swab specimens and should not be used as a sole basis for treatment. Nasal washings and aspirates are unacceptable for Xpert Xpress SARS-CoV-2/FLU/RSV testing.  Fact Sheet for Patients: EntrepreneurPulse.com.au  Fact Sheet for Healthcare Providers: IncredibleEmployment.be  This test is not yet approved or cleared by the [place] FDA and has been authorized for detection and/or diagnosis of SARS-CoV-2 by FDA under an Emergency Use Authorization (EUA). This EUA will remain in effect (meaning this test can be used) for the duration of the COVID-19 declaration under Section 564(b)(1) of the Act, 21 U.S.C. section 360bbb-3(b)(1), unless the authorization is terminated or revoked.     Resp Syncytial Virus by PCR NEGATIVE NEGATIVE Final    Comment: (NOTE) Fact Sheet for Patients: EntrepreneurPulse.com.au  Fact Sheet for Healthcare Providers: IncredibleEmployment.be  This test is not yet approved or cleared by the [place] FDA and has been authorized for detection and/or diagnosis of SARS-CoV-2 by FDA under an  Emergency Use Authorization (EUA). This EUA will remain in effect (meaning this test can be used) for the duration of the COVID-19 declaration under Section 564(b)(1) of the Act, 21 U.S.C. section 360bbb-3(b)(1), unless the authorization is terminated or revoked.  Performed at [facility], [address] [place], [place] [address]   Respiratory (~20 pathogens) panel by PCR     Status: Abnormal   Collection Time: 05/08/22  6:31 PM   Specimen: Nasopharyngeal Swab; Respiratory  Result Value Ref Range Status   Adenovirus NOT DETECTED NOT DETECTED Final   Coronavirus 229E NOT DETECTED NOT DETECTED Final    Comment: (NOTE) The Coronavirus on the Respiratory Panel, DOES NOT test for the novel  Coronavirus (2019 nCoV)    Coronavirus HKU1 NOT DETECTED NOT DETECTED Final   Coronavirus NL63 NOT DETECTED NOT DETECTED Final   Coronavirus OC43 NOT DETECTED NOT DETECTED Final   Metapneumovirus NOT DETECTED NOT DETECTED Final   Rhinovirus / Enterovirus DETECTED (A) NOT DETECTED Final   Influenza A NOT DETECTED NOT DETECTED Final   Influenza B NOT DETECTED NOT DETECTED Final   Parainfluenza Virus 1 NOT DETECTED NOT DETECTED Final   Parainfluenza Virus 2 NOT DETECTED NOT DETECTED Final   Parainfluenza  Virus 3 NOT DETECTED NOT DETECTED Final   Parainfluenza Virus 4 NOT DETECTED NOT DETECTED Final   Respiratory Syncytial Virus NOT DETECTED NOT DETECTED Final   Bordetella pertussis NOT DETECTED NOT DETECTED Final   Bordetella Parapertussis NOT DETECTED NOT DETECTED Final   Chlamydophila pneumoniae NOT DETECTED NOT DETECTED Final   Mycoplasma pneumoniae NOT DETECTED NOT DETECTED Final    Comment: Performed at [facility], [place] [address], [place], [place] [address]    Radiology Studies: CT Angio Chest PE W and/or Wo Contrast  Result Date: 05/08/2022 CLINICAL DATA:  Shortness of breath and cough. Recent history of pneumonia EXAM: CT ANGIOGRAPHY CHEST WITH CONTRAST TECHNIQUE: Multidetector CT  imaging of the chest was performed using the standard protocol during bolus administration of intravenous contrast. Multiplanar CT image reconstructions and MIPs were obtained to evaluate the vascular anatomy. RADIATION DOSE REDUCTION: This exam was performed according to the departmental dose-optimization program which includes automated exposure control, adjustment of the mA and/or kV according to patient size and/or use of iterative reconstruction technique. CONTRAST:  72m OMNIPAQUE IOHEXOL 350 MG/ML SOLN COMPARISON:  X-ray 05/08/2022.  CT scan 04/09/2020. FINDINGS: Cardiovascular: Enlarged heart. The thoracic aorta otherwise normal course and caliber with diffuse vascular calcifications and atherosclerotic change. Coronary artery calcifications are also seen. No significant pericardial effusion. Motion is seen throughout the examination which can limit evaluation of small peripheral emboli. There is some enlargement of the pulmonary arterial tree. Please correlate for evidence of pulmonary hypertension. No segmental or larger pulmonary embolism identified. Mediastinum/Nodes: No specific abnormal lymph node enlargement present in the axillary region. There is an enlarged left hilar lymph node on series 4, image 38 measuring 16 x 14 mm which is new from prior CT scan. No significant right hilar lymph node enlargement. Some prominent mediastinal nodes are seen. Pretracheal node on image 29 of series 4 measures 15 by 13 mm. On the prior study this node would have measured 13 by 9 mm. Normal caliber thoracic esophagus which is slightly patulous. Small hiatal hernia. Small thyroid gland. Lungs/Pleura: Breathing motion seen throughout the examination. There is some dependent areas of nonspecific ground-glass along both lungs diffusely with some interstitial septal thickening. Bronchiectasis noted in the lower lobes with some areas opacity most circum bronchi. Prominent calcifications diffusely of the tracheobronchial  tree. No areas of consolidation at this time. Upper Abdomen: Along the upper abdomen the adrenal glands are preserved. Gallbladder is present. There is a lobular appearance to the left kidney at the edge of the imaging field on the very last image. This could be technical although lesion is not excluded recommend dedicated workup when appropriate just a contrast CT of the abdomen and pelvis. Musculoskeletal: Diffuse degenerative changes are seen including the shoulders and spine. Osteopenia. Slight curvature of the spine as well. Review of the MIP images confirms the above findings. IMPRESSION: Breathing motion. No segmental or larger pulmonary embolism identified. There is some enlargement of the pulmonary arteries which can be seen with pulmonary hypertension. Diffuse areas peribronchial thickening and opacity along areas of bronchi particularly in the lower lobes. Peripheral areas of interstitial changes and ground-glass. Development of some mildly enlarged mediastinal and left hilar lymph nodes of uncertain etiology and significance. Recommend further workup or short follow-up. Lobular appearance to the left kidney at the edge of the imaging field could represent volume averaging on the chest CT however recommend confirmatory study to exclude mass lesion when clinically appropriate Aortic Atherosclerosis (ICD10-I70.0) and Emphysema (ICD10-J43.9). Electronically  Signed   By: Jill Side M.D.   On: 05/08/2022 17:29   DG Chest Port 1 View  Result Date: 05/08/2022 CLINICAL DATA:  Shortness of breath EXAM: PORTABLE CHEST 1 VIEW COMPARISON:  CT 04/09/2020.  X-ray 07/13/2019 and older FINDINGS: Who was rotated to the left. Diffuse interstitial changes are seen which is likely chronic. No consolidation, pneumothorax or effusion. Borderline cardiopericardial silhouette. Calcified tortuous aorta. Overlapping cardiac leads. Left axillary surgical clips. Degenerative changes of both shoulders. IMPRESSION: Rotated  radiograph with chronic lung changes. Borderline size heart and similar to previous. Electronically Signed   By: Jill Side M.D.   On: 05/08/2022 14:09    Scheduled Meds:  atorvastatin  80 mg Oral QHS   cholecalciferol  2,000 Units Oral q AM   cloNIDine  0.1 mg Oral BID   docusate sodium  100 mg Oral Daily   heparin  5,000 Units Subcutaneous Q8H   hydrALAZINE  25 mg Oral TID   hydrochlorothiazide  50 mg Oral Daily   insulin aspart  0-5 Units Subcutaneous QHS   insulin aspart  0-9 Units Subcutaneous TID WC   magnesium oxide  200 mg Oral Daily   mirtazapine  30 mg Oral QHS   rOPINIRole  0.25 mg Oral TID   sertraline  100 mg Oral Daily   Continuous Infusions:  cefTRIAXone (ROCEPHIN)  IV Stopped (05/08/22 2314)   doxycycline (VIBRAMYCIN) IV 100 mg (05/09/22 0610)     LOS: 1 day    Time spent: 50 mins    Januel Doolan, MD [facility]   If 7PM-7AM, please contact night-coverage

## 2022-05-10 DIAGNOSIS — J9601 Acute respiratory failure with hypoxia: Secondary | ICD-10-CM | POA: Diagnosis not present

## 2022-05-10 LAB — CBC
HCT: 34.8 % — ABNORMAL LOW (ref 36.0–46.0)
Hemoglobin: 10.3 g/dL — ABNORMAL LOW (ref 12.0–15.0)
MCH: 23.9 pg — ABNORMAL LOW (ref 26.0–34.0)
MCHC: 29.6 g/dL — ABNORMAL LOW (ref 30.0–36.0)
MCV: 80.7 fL (ref 80.0–100.0)
Platelets: 161 10*3/uL (ref 150–400)
RBC: 4.31 MIL/uL (ref 3.87–5.11)
RDW: 17.9 % — ABNORMAL HIGH (ref 11.5–15.5)
WBC: 6.3 10*3/uL (ref 4.0–10.5)
nRBC: 0 % (ref 0.0–0.2)

## 2022-05-10 LAB — GLUCOSE, CAPILLARY
Glucose-Capillary: 97 mg/dL (ref 70–99)
Glucose-Capillary: 100 mg/dL — ABNORMAL HIGH (ref 70–99)
Glucose-Capillary: 105 mg/dL — ABNORMAL HIGH (ref 70–99)
Glucose-Capillary: 141 mg/dL — ABNORMAL HIGH (ref 70–99)
Glucose-Capillary: 175 mg/dL — ABNORMAL HIGH (ref 70–99)

## 2022-05-10 LAB — BASIC METABOLIC PANEL
Anion gap: 9 (ref 5–15)
BUN: 22 mg/dL (ref 8–23)
CO2: 26 mmol/L (ref 22–32)
Calcium: 8.3 mg/dL — ABNORMAL LOW (ref 8.9–10.3)
Chloride: 104 mmol/L (ref 98–111)
Creatinine, Ser: 0.69 mg/dL (ref 0.44–1.00)
GFR, Estimated: >60 mL/min (ref >60)
Glucose, Bld: 91 mg/dL (ref 70–99)
Potassium: 3.1 mmol/L — ABNORMAL LOW (ref 3.5–5.1)
Sodium: 139 mmol/L (ref 135–145)

## 2022-05-10 LAB — MAGNESIUM: Magnesium: 1.7 mg/dL (ref 1.7–2.4)

## 2022-05-10 LAB — PHOSPHORUS: Phosphorus: 3.2 mg/dL (ref 2.5–4.6)

## 2022-05-10 MED ORDER — IPRATROPIUM-ALBUTEROL 0.5-2.5 (3) MG/3ML IN SOLN
3.0000 mL | Freq: Four times a day (QID) | RESPIRATORY_TRACT | Status: DC
  Administered 2022-05-10 – 2022-05-12 (×8): 3 mL via RESPIRATORY_TRACT
  Filled 2022-05-10 (×8): qty 3

## 2022-05-10 MED ORDER — POTASSIUM CHLORIDE 20 MEQ PO PACK
40.0000 meq | PACK | Freq: Once | ORAL | Status: DC
  Filled 2022-05-10: qty 2

## 2022-05-10 MED ORDER — POTASSIUM CHLORIDE CRYS ER 20 MEQ PO TBCR
40.0000 meq | EXTENDED_RELEASE_TABLET | Freq: Once | ORAL | Status: AC
  Administered 2022-05-10: 40 meq via ORAL
  Filled 2022-05-10: qty 2

## 2022-05-10 NOTE — Progress Notes (Signed)
PROGRESS NOTE    [name]  PRF:[id number] DOB: December 25, 1925 DOA: 05/08/2022 PCP: Jodi Marble, MD   Brief Narrative:  This 89 years old female with history of CVA, atrial fibrillation not on anticoagulation due to age and fall risk, hyperlipidemia, moderate carotid artery disease, pulmonary fibrosis, diastolic CHF, breast cancer presented in the ED for shortness of breath, chest congestion and productive cough.  Patient was started on doxycycline to treat pneumonia at nursing home without improvement.  She does not ambulate at baseline secondary to prior knee injury from fall.  She was noted to have hypoxia with SpO2 of 85% on room air with EMS which has improved to 96% on 2 L.  Patient also reports intermittent dysuria as well for last few weeks.  She also has umbilical hernia and she is scheduled to have repair as an outpatient. In the ED her workup is significant for BNP mildly elevated at 421, creatinine 1.36, troponin 110> 93, COVID, RSV and Influenza negative. Rhino virus +,  CTA chest was obtained which is significant for groundglass opacities in the left lower lung with surrounding lymphadenopathy.  Patient is admitted for further evaluation.  Assessment & Plan:   Principal Problem:   Acute respiratory failure with hypoxia (HCC) Active Problems:   Hyperlipidemia associated with type 2 diabetes mellitus ([place])   Hypertension associated with diabetes ([place])   Pulmonary fibrosis (HCC)   GERD   Permanent atrial fibrillation (HCC)   Chronic diastolic CHF (congestive heart failure) (HCC)   Type 2 diabetes mellitus with diabetic neuropathy, unspecified (HCC)  Acute hypoxic respiratory failure secondary to CAP: Patient presented with productive cough, chest congestion and shortness of breath. She is found to have hypoxia with SpO2 of 85% on room air after EMS arrived requiring 2 L of supplemental oxygen to maintain saturation above 90%. CTA chest ruled out PE but shows findings  consistent with pneumonia. Empirically initiated on doxycycline and IV Rocephin. Procalcitonin 8.32 consistent with bacterial pneumonia Influenza, COVID, RSV negative, Rhino virus +, strep pneumo antigen negative. Follow-up Legionella antigen and sputum cultures. Continue supplemental oxygen and wean as tolerated.  Prolonged Qtc: Avoid QT prolonging medications. Obtain repeat EKG.  Chronic atrial fibrillation: Heart rate is well-controlled.   Anticoagulation has been discontinued recently due to multiple falls,  Not candidate for anticoagulation.  Essential hypertension: Continue hydralazine, clonidine, hydrochlorothiazide.  Type 2 diabetes: Hold p.o. diabetic medications Continue regular insulin sliding scale.  Hyperlipidemia: Continue Lipitor  Abnormal imaging of left kidney: Radiologist recommended confirmatory study to exclude mass lesion when clinically appropriate.  Depression/ Anxiety: Continue Remeron, Zoloft.  UTI: UA consistent with UTI.  Continue ceftriaxone.   Follow-up urine culture.  DVT prophylaxis: Heparin Code Status: DNR Family Communication: No family at bedside. Disposition Plan:  Status is: Inpatient Remains inpatient appropriate because: Admitted for acute hypoxic respiratory failure secondary to community-acquired pneumonia requiring IV antibiotics and supplemental oxygen.   Consultants:  None  Procedures: CTA chest.  Antimicrobials:  Anti-infectives (From admission, onward)    Start     Dose/Rate Route Frequency Ordered Stop   05/08/22 1800  cefTRIAXone (ROCEPHIN) 2 g in sodium chloride 0.9 % 100 mL IVPB        2 g 200 mL/hr over 30 Minutes Intravenous Every 24 hours 05/08/22 1751     05/08/22 1800  doxycycline (VIBRAMYCIN) 100 mg in sodium chloride 0.9 % 250 mL IVPB        100 mg 125 mL/hr over 120 Minutes Intravenous Every 12  hours 05/08/22 1751        Subjective: Patient was seen and examined at bedside. Overnight events  noted. Patient reports doing much better, still having urinary burning. She remains on 3 L of supplemental oxygen. At baseline, She does not use oxygen.   Objective: Vitals:   05/09/22 1505 05/09/22 2142 05/10/22 0603 05/10/22 0624  BP: 129/61 (!) 149/53 (!) 155/58   Pulse: (!) 56 (!) 58 (!) 55   Resp: '17 17 18   '$ Temp: 98.3 F (36.8 C) 99.5 F (37.5 C) 98 F (36.7 C)   TempSrc: Oral Oral Oral   SpO2: 98% 97% 98% 98%  Weight:      Height:        Intake/Output Summary (Last 24 hours) at 05/10/2022 1040 Last data filed at 05/10/2022 4196 Gross per 24 hour  Intake 370.41 ml  Output 1350 ml  Net -979.59 ml   Filed Weights   05/09/22 0144  Weight: 58.2 kg    Examination:  General exam: Appears comfortable, not in any acute distress.  Deconditioned. Respiratory system: CTA bilaterally, respiratory effort normal, RR 15. Cardiovascular system: S1 & S2 heard, regular rate and rhythm,  murmur +. Gastrointestinal system: Abdomen is soft, non tender, non distended, BS+ Central nervous system: Alert and oriented x 3, no focal neurological deficits. Extremities: No edema, no cyanosis, no clubbing. Skin: No rashes, lesions or ulcers Psychiatry: Judgement and insight appear normal. Mood & affect appropriate.     Data Reviewed: I have personally reviewed following labs and imaging studies  CBC: Recent Labs  Lab 05/08/22 1327 05/09/22 0447 05/10/22 0422  WBC 7.8 6.3 6.3  NEUTROABS 4.6  --   --   HGB 10.2* 10.5* 10.3*  HCT 34.9* 35.9* 34.8*  MCV 81.4 82.2 80.7  PLT 163 154 222   Basic Metabolic Panel: Recent Labs  Lab 05/08/22 1327 05/09/22 0447 05/10/22 0422  NA 137 138 139  K 4.1 3.2* 3.1*  CL 104 104 104  CO2 '23 25 26  '$ GLUCOSE 128* 83 91  BUN 35* 32* 22  CREATININE 1.36* 0.95 0.69  CALCIUM 8.3* 8.2* 8.3*  MG  --   --  1.7  PHOS  --   --  3.2   GFR: Estimated Creatinine Clearance: 35.6 mL/min (by C-G formula based on SCr of 0.69 mg/dL). Liver Function  Tests: No results for input(s): "AST", "ALT", "ALKPHOS", "BILITOT", "PROT", "ALBUMIN" in the last 168 hours. No results for input(s): "LIPASE", "AMYLASE" in the last 168 hours. No results for input(s): "AMMONIA" in the last 168 hours. Coagulation Profile: No results for input(s): "INR", "PROTIME" in the last 168 hours. Cardiac Enzymes: No results for input(s): "CKTOTAL", "CKMB", "CKMBINDEX", "TROPONINI" in the last 168 hours. BNP (last 3 results) No results for input(s): "PROBNP" in the last 8760 hours. HbA1C: Recent Labs    05/08/22 1852  HGBA1C 6.3*   CBG: Recent Labs  Lab 05/08/22 2127 05/09/22 1139 05/09/22 1603 05/09/22 2141 05/10/22 0751  GLUCAP 74 114* 161* 97 100*   Lipid Profile: No results for input(s): "CHOL", "HDL", "LDLCALC", "TRIG", "CHOLHDL", "LDLDIRECT" in the last 72 hours. Thyroid Function Tests: No results for input(s): "TSH", "T4TOTAL", "FREET4", "T3FREE", "THYROIDAB" in the last 72 hours. Anemia Panel: No results for input(s): "VITAMINB12", "FOLATE", "FERRITIN", "TIBC", "IRON", "RETICCTPCT" in the last 72 hours. Sepsis Labs: Recent Labs  Lab 05/09/22 0910  PROCALCITON 8.83    Recent Results (from the past 240 hour(s))  Resp panel by RT-PCR (RSV, Flu A&B,  Covid) Anterior Nasal Swab     Status: None   Collection Time: 05/08/22  1:43 PM   Specimen: Anterior Nasal Swab  Result Value Ref Range Status   SARS Coronavirus 2 by RT PCR NEGATIVE NEGATIVE Final    Comment: (NOTE) SARS-CoV-2 target nucleic acids are NOT DETECTED.  The SARS-CoV-2 RNA is generally detectable in upper respiratory specimens during the acute phase of infection. The lowest concentration of SARS-CoV-2 viral copies this assay can detect is 138 copies/mL. A negative result does not preclude SARS-Cov-2 infection and should not be used as the sole basis for treatment or other patient management decisions. A negative result may occur with  improper specimen collection/handling,  submission of specimen other than nasopharyngeal swab, presence of viral mutation(s) within the areas targeted by this assay, and inadequate number of viral copies(<138 copies/mL). A negative result must be combined with clinical observations, patient history, and epidemiological information. The expected result is Negative.  Fact Sheet for Patients:  EntrepreneurPulse.com.au  Fact Sheet for Healthcare Providers:  IncredibleEmployment.be  This test is no t yet approved or cleared by the [place] FDA and  has been authorized for detection and/or diagnosis of SARS-CoV-2 by FDA under an Emergency Use Authorization (EUA). This EUA will remain  in effect (meaning this test can be used) for the duration of the COVID-19 declaration under Section 564(b)(1) of the Act, 21 U.S.C.section 360bbb-3(b)(1), unless the authorization is terminated  or revoked sooner.       Influenza A by PCR NEGATIVE NEGATIVE Final   Influenza B by PCR NEGATIVE NEGATIVE Final    Comment: (NOTE) The Xpert Xpress SARS-CoV-2/FLU/RSV plus assay is intended as an aid in the diagnosis of influenza from Nasopharyngeal swab specimens and should not be used as a sole basis for treatment. Nasal washings and aspirates are unacceptable for Xpert Xpress SARS-CoV-2/FLU/RSV testing.  Fact Sheet for Patients: EntrepreneurPulse.com.au  Fact Sheet for Healthcare Providers: IncredibleEmployment.be  This test is not yet approved or cleared by the [place] FDA and has been authorized for detection and/or diagnosis of SARS-CoV-2 by FDA under an Emergency Use Authorization (EUA). This EUA will remain in effect (meaning this test can be used) for the duration of the COVID-19 declaration under Section 564(b)(1) of the Act, 21 U.S.C. section 360bbb-3(b)(1), unless the authorization is terminated or revoked.     Resp Syncytial Virus by PCR NEGATIVE  NEGATIVE Final    Comment: (NOTE) Fact Sheet for Patients: EntrepreneurPulse.com.au  Fact Sheet for Healthcare Providers: IncredibleEmployment.be  This test is not yet approved or cleared by the [place] FDA and has been authorized for detection and/or diagnosis of SARS-CoV-2 by FDA under an Emergency Use Authorization (EUA). This EUA will remain in effect (meaning this test can be used) for the duration of the COVID-19 declaration under Section 564(b)(1) of the Act, 21 U.S.C. section 360bbb-3(b)(1), unless the authorization is terminated or revoked.  Performed at [facility], [address], [place], [place] [address]   Respiratory (~20 pathogens) panel by PCR     Status: Abnormal   Collection Time: 05/08/22  6:31 PM   Specimen: Nasopharyngeal Swab; Respiratory  Result Value Ref Range Status   Adenovirus NOT DETECTED NOT DETECTED Final   Coronavirus 229E NOT DETECTED NOT DETECTED Final    Comment: (NOTE) The Coronavirus on the Respiratory Panel, DOES NOT test for the novel  Coronavirus (2019 nCoV)    Coronavirus HKU1 NOT DETECTED NOT DETECTED Final   Coronavirus NL63 NOT DETECTED NOT DETECTED  Final   Coronavirus OC43 NOT DETECTED NOT DETECTED Final   Metapneumovirus NOT DETECTED NOT DETECTED Final   Rhinovirus / Enterovirus DETECTED (A) NOT DETECTED Final   Influenza A NOT DETECTED NOT DETECTED Final   Influenza B NOT DETECTED NOT DETECTED Final   Parainfluenza Virus 1 NOT DETECTED NOT DETECTED Final   Parainfluenza Virus 2 NOT DETECTED NOT DETECTED Final   Parainfluenza Virus 3 NOT DETECTED NOT DETECTED Final   Parainfluenza Virus 4 NOT DETECTED NOT DETECTED Final   Respiratory Syncytial Virus NOT DETECTED NOT DETECTED Final   Bordetella pertussis NOT DETECTED NOT DETECTED Final   Bordetella Parapertussis NOT DETECTED NOT DETECTED Final   Chlamydophila pneumoniae NOT DETECTED NOT DETECTED Final   Mycoplasma pneumoniae NOT DETECTED  NOT DETECTED Final    Comment: Performed at [facility], [place] [address], [place], [place] [address]    Radiology Studies: CT Angio Chest PE W and/or Wo Contrast  Result Date: 05/08/2022 CLINICAL DATA:  Shortness of breath and cough. Recent history of pneumonia EXAM: CT ANGIOGRAPHY CHEST WITH CONTRAST TECHNIQUE: Multidetector CT imaging of the chest was performed using the standard protocol during bolus administration of intravenous contrast. Multiplanar CT image reconstructions and MIPs were obtained to evaluate the vascular anatomy. RADIATION DOSE REDUCTION: This exam was performed according to the departmental dose-optimization program which includes automated exposure control, adjustment of the mA and/or kV according to patient size and/or use of iterative reconstruction technique. CONTRAST:  74m OMNIPAQUE IOHEXOL 350 MG/ML SOLN COMPARISON:  X-ray 05/08/2022.  CT scan 04/09/2020. FINDINGS: Cardiovascular: Enlarged heart. The thoracic aorta otherwise normal course and caliber with diffuse vascular calcifications and atherosclerotic change. Coronary artery calcifications are also seen. No significant pericardial effusion. Motion is seen throughout the examination which can limit evaluation of small peripheral emboli. There is some enlargement of the pulmonary arterial tree. Please correlate for evidence of pulmonary hypertension. No segmental or larger pulmonary embolism identified. Mediastinum/Nodes: No specific abnormal lymph node enlargement present in the axillary region. There is an enlarged left hilar lymph node on series 4, image 38 measuring 16 x 14 mm which is new from prior CT scan. No significant right hilar lymph node enlargement. Some prominent mediastinal nodes are seen. Pretracheal node on image 29 of series 4 measures 15 by 13 mm. On the prior study this node would have measured 13 by 9 mm. Normal caliber thoracic esophagus which is slightly patulous. Small hiatal hernia. Small thyroid  gland. Lungs/Pleura: Breathing motion seen throughout the examination. There is some dependent areas of nonspecific ground-glass along both lungs diffusely with some interstitial septal thickening. Bronchiectasis noted in the lower lobes with some areas opacity most circum bronchi. Prominent calcifications diffusely of the tracheobronchial tree. No areas of consolidation at this time. Upper Abdomen: Along the upper abdomen the adrenal glands are preserved. Gallbladder is present. There is a lobular appearance to the left kidney at the edge of the imaging field on the very last image. This could be technical although lesion is not excluded recommend dedicated workup when appropriate just a contrast CT of the abdomen and pelvis. Musculoskeletal: Diffuse degenerative changes are seen including the shoulders and spine. Osteopenia. Slight curvature of the spine as well. Review of the MIP images confirms the above findings. IMPRESSION: Breathing motion. No segmental or larger pulmonary embolism identified. There is some enlargement of the pulmonary arteries which can be seen with pulmonary hypertension. Diffuse areas peribronchial thickening and opacity along areas of bronchi particularly in the lower  lobes. Peripheral areas of interstitial changes and ground-glass. Development of some mildly enlarged mediastinal and left hilar lymph nodes of uncertain etiology and significance. Recommend further workup or short follow-up. Lobular appearance to the left kidney at the edge of the imaging field could represent volume averaging on the chest CT however recommend confirmatory study to exclude mass lesion when clinically appropriate Aortic Atherosclerosis (ICD10-I70.0) and Emphysema (ICD10-J43.9). Electronically Signed   By: Jill Side M.D.   On: 05/08/2022 17:29   DG Chest Port 1 View  Result Date: 05/08/2022 CLINICAL DATA:  Shortness of breath EXAM: PORTABLE CHEST 1 VIEW COMPARISON:  CT 04/09/2020.  X-ray 07/13/2019 and  older FINDINGS: Who was rotated to the left. Diffuse interstitial changes are seen which is likely chronic. No consolidation, pneumothorax or effusion. Borderline cardiopericardial silhouette. Calcified tortuous aorta. Overlapping cardiac leads. Left axillary surgical clips. Degenerative changes of both shoulders. IMPRESSION: Rotated radiograph with chronic lung changes. Borderline size heart and similar to previous. Electronically Signed   By: Jill Side M.D.   On: 05/08/2022 14:09    Scheduled Meds:  atorvastatin  80 mg Oral QHS   cholecalciferol  2,000 Units Oral q AM   cloNIDine  0.1 mg Oral BID   docusate sodium  100 mg Oral Daily   heparin  5,000 Units Subcutaneous Q8H   hydrALAZINE  25 mg Oral TID   hydrochlorothiazide  50 mg Oral Daily   insulin aspart  0-5 Units Subcutaneous QHS   insulin aspart  0-9 Units Subcutaneous TID WC   ipratropium-albuterol  3 mL Nebulization Q6H   magnesium oxide  200 mg Oral Daily   mirtazapine  30 mg Oral QHS   potassium chloride  40 mEq Oral Once   potassium chloride  40 mEq Oral Once   rOPINIRole  0.25 mg Oral TID   sertraline  100 mg Oral Daily   Continuous Infusions:  cefTRIAXone (ROCEPHIN)  IV 2 g (05/09/22 1718)   doxycycline (VIBRAMYCIN) IV 100 mg (05/10/22 0552)     LOS: 2 days    Time spent: 35 mins    Sadie Pickar, MD [facility]   If 7PM-7AM, please contact night-coverage

## 2022-05-10 NOTE — Progress Notes (Signed)
Patient rested quietly throughout the night no c/o discomfort voiced. RT called for nebulizer tx for wheezing this am. Patient tolerated nebulizer well.

## 2022-05-11 DIAGNOSIS — J9601 Acute respiratory failure with hypoxia: Secondary | ICD-10-CM | POA: Diagnosis not present

## 2022-05-11 LAB — GLUCOSE, CAPILLARY
Glucose-Capillary: 101 mg/dL — ABNORMAL HIGH (ref 70–99)
Glucose-Capillary: 141 mg/dL — ABNORMAL HIGH (ref 70–99)
Glucose-Capillary: 137 mg/dL — ABNORMAL HIGH (ref 70–99)
Glucose-Capillary: 113 mg/dL — ABNORMAL HIGH (ref 70–99)

## 2022-05-11 LAB — POTASSIUM: Potassium: 3.2 mmol/L — ABNORMAL LOW (ref 3.5–5.1)

## 2022-05-11 MED ORDER — POTASSIUM CHLORIDE 20 MEQ PO PACK: 40.0000 meq | PACK | Freq: Once | ORAL | Status: DC

## 2022-05-11 MED ORDER — POTASSIUM CHLORIDE CRYS ER 20 MEQ PO TBCR
40.0000 meq | EXTENDED_RELEASE_TABLET | Freq: Once | ORAL | Status: AC
  Administered 2022-05-11: 40 meq via ORAL
  Filled 2022-05-11: qty 2

## 2022-05-11 NOTE — Progress Notes (Signed)
PROGRESS NOTE    [name]  YWV:[id number] DOB: 1925-10-10 DOA: 05/08/2022 PCP: Jodi Marble, MD   Brief Narrative:  This 89 years old female with history of CVA, atrial fibrillation not on anticoagulation due to age and fall risk, hyperlipidemia, moderate carotid artery disease, pulmonary fibrosis, diastolic CHF, breast cancer presented in the ED for shortness of breath, chest congestion and productive cough.  Patient was started on doxycycline to treat pneumonia at nursing home without improvement.  She does not ambulate at baseline secondary to prior knee injury from fall.  She was noted to have hypoxia with SpO2 of 85% on room air with EMS which has improved to 96% on 2 L.  Patient also reports intermittent dysuria as well for last few weeks.  She also has umbilical hernia and she is scheduled to have repair as an outpatient. In the ED her workup is significant for BNP mildly elevated at 421, creatinine 1.36, troponin 110> 93, COVID, RSV and Influenza negative. Rhino virus +,  CTA chest was obtained which is significant for groundglass opacities in the left lower lung with surrounding lymphadenopathy.  Patient is admitted for further evaluation.  Assessment & Plan:   Principal Problem:   Acute respiratory failure with hypoxia (HCC) Active Problems:   Hyperlipidemia associated with type 2 diabetes mellitus ([place])   Hypertension associated with diabetes ([place])   Pulmonary fibrosis (HCC)   GERD   Permanent atrial fibrillation (HCC)   Chronic diastolic CHF (congestive heart failure) (HCC)   Type 2 diabetes mellitus with diabetic neuropathy, unspecified (HCC)  Acute hypoxic respiratory failure secondary to CAP: Patient presented with productive cough, chest congestion and shortness of breath. She is found to have hypoxia with SpO2 of 85% on room air after EMS arrived requiring 2 L of supplemental oxygen to maintain saturation above 90%. CTA chest ruled out PE but shows findings  consistent with pneumonia. Continue doxycycline and IV Rocephin x 5 days. Procalcitonin 8.32 consistent with bacterial pneumonia Influenza, COVID, RSV negative, Rhino virus +, strep pneumo antigen negative. Follow-up Legionella antigen and sputum cultures. Continue supplemental oxygen and wean as tolerated.  Prolonged Qtc: Avoid QT prolonging medications. Repeat EKG showed improved QTC  Chronic atrial fibrillation: Heart rate is well-controlled.   Anticoagulation has been discontinued recently due to multiple falls,  Not a candidate for anticoagulation.  Essential hypertension: Continue hydralazine, clonidine, hydrochlorothiazide.  Type 2 diabetes: Hold p.o. diabetic medications Continue regular insulin sliding scale.  Hyperlipidemia: Continue Lipitor  Abnormal imaging of left kidney: Radiologist recommended confirmatory study to exclude mass lesion when clinically appropriate.  Depression/ Anxiety: Continue Remeron, Zoloft.  UTI: UA consistent with UTI.  Continue ceftriaxone.   Follow-up urine culture.  DVT prophylaxis: Heparin Code Status: DNR Family Communication: No family at bedside. Disposition Plan:  Status is: Inpatient Remains inpatient appropriate because: Admitted for acute hypoxic respiratory failure secondary to community-acquired pneumonia requiring IV antibiotics and supplemental oxygen.   Consultants:  None  Procedures: CTA chest.  Antimicrobials:  Anti-infectives (From admission, onward)    Start     Dose/Rate Route Frequency Ordered Stop   05/08/22 1800  cefTRIAXone (ROCEPHIN) 2 g in sodium chloride 0.9 % 100 mL IVPB        2 g 200 mL/hr over 30 Minutes Intravenous Every 24 hours 05/08/22 1751     05/08/22 1800  doxycycline (VIBRAMYCIN) 100 mg in sodium chloride 0.9 % 250 mL IVPB        100 mg 125 mL/hr over 120  Minutes Intravenous Every 12 hours 05/08/22 1751        Subjective: Patient was seen and examined at bedside. Overnight events  noted. Patient reports doing much better , states she feels strong and wants to be discharged.   She is at 2 L of supplemental oxygen.  Objective: Vitals:   05/10/22 2115 05/11/22 0208 05/11/22 0532 05/11/22 0756  BP: (!) 132/47  (!) 157/99   Pulse: 75  (!) 55   Resp: 18  20   Temp: 98.4 F (36.9 C)  98.7 F (37.1 C)   TempSrc: Oral  Oral   SpO2: 96% 96% 97% 96%  Weight:      Height:        Intake/Output Summary (Last 24 hours) at 05/11/2022 1115 Last data filed at 05/10/2022 1901 Gross per 24 hour  Intake 369.18 ml  Output 700 ml  Net -330.82 ml   Filed Weights   05/09/22 0144  Weight: 58.2 kg    Examination:  General exam: Appears comfortable, not in any acute distress.  Deconditioned Respiratory system: CTA bilaterally, respiratory effort normal, RR 15. Cardiovascular system: S1 & S2 heard, regular rate and rhythm,  murmur +. Gastrointestinal system: Abdomen is soft, non tender, non distended, BS+ Central nervous system: Alert and oriented x 3, no focal neurological deficits. Extremities: No edema, no cyanosis, no clubbing. Skin: No rashes, lesions or ulcers Psychiatry: Judgement and insight appear normal. Mood & affect appropriate.     Data Reviewed: I have personally reviewed following labs and imaging studies  CBC: Recent Labs  Lab 05/08/22 1327 05/09/22 0447 05/10/22 0422  WBC 7.8 6.3 6.3  NEUTROABS 4.6  --   --   HGB 10.2* 10.5* 10.3*  HCT 34.9* 35.9* 34.8*  MCV 81.4 82.2 80.7  PLT 163 154 222   Basic Metabolic Panel: Recent Labs  Lab 05/08/22 1327 05/09/22 0447 05/10/22 0422 05/11/22 0634  NA 137 138 139  --   K 4.1 3.2* 3.1* 3.2*  CL 104 104 104  --   CO2 '23 25 26  '$ --   GLUCOSE 128* 83 91  --   BUN 35* 32* 22  --   CREATININE 1.36* 0.95 0.69  --   CALCIUM 8.3* 8.2* 8.3*  --   MG  --   --  1.7  --   PHOS  --   --  3.2  --    GFR: Estimated Creatinine Clearance: 35.6 mL/min (by C-G formula based on SCr of 0.69 mg/dL). Liver Function  Tests: No results for input(s): "AST", "ALT", "ALKPHOS", "BILITOT", "PROT", "ALBUMIN" in the last 168 hours. No results for input(s): "LIPASE", "AMYLASE" in the last 168 hours. No results for input(s): "AMMONIA" in the last 168 hours. Coagulation Profile: No results for input(s): "INR", "PROTIME" in the last 168 hours. Cardiac Enzymes: No results for input(s): "CKTOTAL", "CKMB", "CKMBINDEX", "TROPONINI" in the last 168 hours. BNP (last 3 results) No results for input(s): "PROBNP" in the last 8760 hours. HbA1C: Recent Labs    05/08/22 1852  HGBA1C 6.3*   CBG: Recent Labs  Lab 05/10/22 0751 05/10/22 1208 05/10/22 1707 05/10/22 2118 05/11/22 0753  GLUCAP 100* 105* 141* 175* 101*   Lipid Profile: No results for input(s): "CHOL", "HDL", "LDLCALC", "TRIG", "CHOLHDL", "LDLDIRECT" in the last 72 hours. Thyroid Function Tests: No results for input(s): "TSH", "T4TOTAL", "FREET4", "T3FREE", "THYROIDAB" in the last 72 hours. Anemia Panel: No results for input(s): "VITAMINB12", "FOLATE", "FERRITIN", "TIBC", "IRON", "RETICCTPCT" in the last 72 hours.  Sepsis Labs: Recent Labs  Lab 05/09/22 0910  PROCALCITON 8.83    Recent Results (from the past 240 hour(s))  Resp panel by RT-PCR (RSV, Flu A&B, Covid) Anterior Nasal Swab     Status: None   Collection Time: 05/08/22  1:43 PM   Specimen: Anterior Nasal Swab  Result Value Ref Range Status   SARS Coronavirus 2 by RT PCR NEGATIVE NEGATIVE Final    Comment: (NOTE) SARS-CoV-2 target nucleic acids are NOT DETECTED.  The SARS-CoV-2 RNA is generally detectable in upper respiratory specimens during the acute phase of infection. The lowest concentration of SARS-CoV-2 viral copies this assay can detect is 138 copies/mL. A negative result does not preclude SARS-Cov-2 infection and should not be used as the sole basis for treatment or other patient management decisions. A negative result may occur with  improper specimen collection/handling,  submission of specimen other than nasopharyngeal swab, presence of viral mutation(s) within the areas targeted by this assay, and inadequate number of viral copies(<138 copies/mL). A negative result must be combined with clinical observations, patient history, and epidemiological information. The expected result is Negative.  Fact Sheet for Patients:  EntrepreneurPulse.com.au  Fact Sheet for Healthcare Providers:  IncredibleEmployment.be  This test is no t yet approved or cleared by the [place] FDA and  has been authorized for detection and/or diagnosis of SARS-CoV-2 by FDA under an Emergency Use Authorization (EUA). This EUA will remain  in effect (meaning this test can be used) for the duration of the COVID-19 declaration under Section 564(b)(1) of the Act, 21 U.S.C.section 360bbb-3(b)(1), unless the authorization is terminated  or revoked sooner.       Influenza A by PCR NEGATIVE NEGATIVE Final   Influenza B by PCR NEGATIVE NEGATIVE Final    Comment: (NOTE) The Xpert Xpress SARS-CoV-2/FLU/RSV plus assay is intended as an aid in the diagnosis of influenza from Nasopharyngeal swab specimens and should not be used as a sole basis for treatment. Nasal washings and aspirates are unacceptable for Xpert Xpress SARS-CoV-2/FLU/RSV testing.  Fact Sheet for Patients: EntrepreneurPulse.com.au  Fact Sheet for Healthcare Providers: IncredibleEmployment.be  This test is not yet approved or cleared by the [place] FDA and has been authorized for detection and/or diagnosis of SARS-CoV-2 by FDA under an Emergency Use Authorization (EUA). This EUA will remain in effect (meaning this test can be used) for the duration of the COVID-19 declaration under Section 564(b)(1) of the Act, 21 U.S.C. section 360bbb-3(b)(1), unless the authorization is terminated or revoked.     Resp Syncytial Virus by PCR NEGATIVE  NEGATIVE Final    Comment: (NOTE) Fact Sheet for Patients: EntrepreneurPulse.com.au  Fact Sheet for Healthcare Providers: IncredibleEmployment.be  This test is not yet approved or cleared by the [place] FDA and has been authorized for detection and/or diagnosis of SARS-CoV-2 by FDA under an Emergency Use Authorization (EUA). This EUA will remain in effect (meaning this test can be used) for the duration of the COVID-19 declaration under Section 564(b)(1) of the Act, 21 U.S.C. section 360bbb-3(b)(1), unless the authorization is terminated or revoked.  Performed at [facility], [address], [place], [place] [address]   Respiratory (~20 pathogens) panel by PCR     Status: Abnormal   Collection Time: 05/08/22  6:31 PM   Specimen: Nasopharyngeal Swab; Respiratory  Result Value Ref Range Status   Adenovirus NOT DETECTED NOT DETECTED Final   Coronavirus 229E NOT DETECTED NOT DETECTED Final    Comment: (NOTE) The Coronavirus on the Respiratory  Panel, DOES NOT test for the novel  Coronavirus (2019 nCoV)    Coronavirus HKU1 NOT DETECTED NOT DETECTED Final   Coronavirus NL63 NOT DETECTED NOT DETECTED Final   Coronavirus OC43 NOT DETECTED NOT DETECTED Final   Metapneumovirus NOT DETECTED NOT DETECTED Final   Rhinovirus / Enterovirus DETECTED (A) NOT DETECTED Final   Influenza A NOT DETECTED NOT DETECTED Final   Influenza B NOT DETECTED NOT DETECTED Final   Parainfluenza Virus 1 NOT DETECTED NOT DETECTED Final   Parainfluenza Virus 2 NOT DETECTED NOT DETECTED Final   Parainfluenza Virus 3 NOT DETECTED NOT DETECTED Final   Parainfluenza Virus 4 NOT DETECTED NOT DETECTED Final   Respiratory Syncytial Virus NOT DETECTED NOT DETECTED Final   Bordetella pertussis NOT DETECTED NOT DETECTED Final   Bordetella Parapertussis NOT DETECTED NOT DETECTED Final   Chlamydophila pneumoniae NOT DETECTED NOT DETECTED Final   Mycoplasma pneumoniae NOT DETECTED  NOT DETECTED Final    Comment: Performed at [facility], [place] [address], [place], [place] [address]    Radiology Studies: No results found.  Scheduled Meds:  atorvastatin  80 mg Oral QHS   cholecalciferol  2,000 Units Oral q AM   cloNIDine  0.1 mg Oral BID   docusate sodium  100 mg Oral Daily   heparin  5,000 Units Subcutaneous Q8H   hydrALAZINE  25 mg Oral TID   hydrochlorothiazide  50 mg Oral Daily   insulin aspart  0-5 Units Subcutaneous QHS   insulin aspart  0-9 Units Subcutaneous TID WC   ipratropium-albuterol  3 mL Nebulization Q6H   magnesium oxide  200 mg Oral Daily   mirtazapine  30 mg Oral QHS   rOPINIRole  0.25 mg Oral TID   sertraline  100 mg Oral Daily   Continuous Infusions:  cefTRIAXone (ROCEPHIN)  IV 2 g (05/10/22 1732)   doxycycline (VIBRAMYCIN) IV 100 mg (05/11/22 0539)     LOS: 3 days    Time spent: 35 mins    Kristle Wesch, MD [facility]   If 7PM-7AM, please contact night-coverage

## 2022-05-12 DIAGNOSIS — J9601 Acute respiratory failure with hypoxia: Secondary | ICD-10-CM | POA: Diagnosis not present

## 2022-05-12 LAB — GLUCOSE, CAPILLARY
Glucose-Capillary: 90 mg/dL (ref 70–99)
Glucose-Capillary: 106 mg/dL — ABNORMAL HIGH (ref 70–99)

## 2022-05-12 LAB — BASIC METABOLIC PANEL
Anion gap: 8 (ref 5–15)
BUN: 18 mg/dL (ref 8–23)
CO2: 26 mmol/L (ref 22–32)
Calcium: 8.6 mg/dL — ABNORMAL LOW (ref 8.9–10.3)
Chloride: 104 mmol/L (ref 98–111)
Creatinine, Ser: 0.74 mg/dL (ref 0.44–1.00)
GFR, Estimated: >60 mL/min (ref >60)
Glucose, Bld: 96 mg/dL (ref 70–99)
Potassium: 3.5 mmol/L (ref 3.5–5.1)
Sodium: 138 mmol/L (ref 135–145)

## 2022-05-12 LAB — CBC
HCT: 34 % — ABNORMAL LOW (ref 36.0–46.0)
Hemoglobin: 10.1 g/dL — ABNORMAL LOW (ref 12.0–15.0)
MCH: 23.9 pg — ABNORMAL LOW (ref 26.0–34.0)
MCHC: 29.7 g/dL — ABNORMAL LOW (ref 30.0–36.0)
MCV: 80.4 fL (ref 80.0–100.0)
Platelets: 164 10*3/uL (ref 150–400)
RBC: 4.23 MIL/uL (ref 3.87–5.11)
RDW: 18.5 % — ABNORMAL HIGH (ref 11.5–15.5)
WBC: 5.9 10*3/uL (ref 4.0–10.5)
nRBC: 0 % (ref 0.0–0.2)

## 2022-05-12 LAB — PHOSPHORUS: Phosphorus: 2.8 mg/dL (ref 2.5–4.6)

## 2022-05-12 LAB — MAGNESIUM: Magnesium: 1.6 mg/dL — ABNORMAL LOW (ref 1.7–2.4)

## 2022-05-12 MED ORDER — MAGNESIUM SULFATE 2 GM/50ML IV SOLN
2.0000 g | Freq: Once | INTRAVENOUS | Status: AC
  Administered 2022-05-12: 2 g via INTRAVENOUS
  Filled 2022-05-12: qty 50

## 2022-05-12 MED ORDER — HYDRALAZINE HCL 25 MG PO TABS
50.0000 mg | ORAL_TABLET | Freq: Three times a day (TID) | ORAL | Status: DC
  Administered 2022-05-12: 50 mg via ORAL
  Filled 2022-05-12: qty 2

## 2022-05-12 MED ORDER — CEPHALEXIN 500 MG PO CAPS: 500.0000 mg | ORAL_CAPSULE | Freq: Two times a day (BID) | ORAL | 0 refills | Status: AC

## 2022-05-12 MED ORDER — IPRATROPIUM-ALBUTEROL 0.5-2.5 (3) MG/3ML IN SOLN: 3.0000 mL | Freq: Two times a day (BID) | RESPIRATORY_TRACT | Status: DC

## 2022-05-12 NOTE — Care Management Important Message (Signed)
Important Message  Patient Details  Name: [name] MRN: [id number] Date of Birth: 04/14/1926   Medicare Important Message Given:  Other (see comment) (spoke with son [name] on 05/09/21, no additional copy needed)     Tommy Medal 05/12/2022, 12:58 PM

## 2022-05-12 NOTE — Discharge Instructions (Signed)
Advised to follow-up with primary care physician in 1 week. Advised to continue current medications as prescribed. She has completed antibiotics for 5 days for community-acquired pneumonia.

## 2022-05-12 NOTE — NC FL2 (Signed)
[place] MEDICAID FL2 LEVEL OF CARE FORM     IDENTIFICATION  Patient Name: [name] Birthdate: 1926/02/11 Sex: female Admission Date (Current Location): 05/08/2022  [facility] [place] Number:  [employer] and Address:  [place] [address] [place]      Provider Number: [id number]  Attending Physician Name and Address:  Shawna Clamp, MD  Relative Name and Phone Number:       Current Level of Care: Hospital Recommended Level of Care: [place] Prior Approval Number:    Date Approved/Denied:   PASRR Number:    Discharge Plan: SNF    Current Diagnoses: Patient Active Problem List   Diagnosis Date Noted   Acute respiratory failure with hypoxia ([place]) 05/08/2022   Acute postoperative pain of left knee 09/16/2021   Encounter to establish care 02/20/2020   Bilateral subdural hematomas ([place]) 07/16/2019   CHI (closed head injury), initial encounter 07/14/2019   Left radial head fracture 07/14/2019   SDH (subdural hematoma) (HCC)    Hypertensive urgency    Subarachnoid hemorrhage following injury ([place]) 07/13/2019   Acute encephalopathy 06/27/2019   Heat stroke 06/27/2019   Hypokalemia 06/27/2019   Fever, unspecified 06/27/2019   Stroke ([place]) 06/27/2019   Altered mental status 06/26/2019   Right hip pain 06/22/2019   Chronic left hip pain 05/25/2019   Depression, recurrent ([place]) 05/25/2019   Chronic anticoagulation 04/19/2019   Leg cramps 03/04/2019   Short-term memory loss 03/04/2019   DOE (dyspnea on exertion) 03/04/2019   Aortic atherosclerosis ([place]) 03/04/2019   Physical deconditioning 01/24/2019   History of recurrent UTIs 12/03/2018   Chronic pain of right knee 10/08/2018   Type 2 diabetes mellitus with diabetic neuropathy, unspecified ([place]) 08/12/2018   Pulmonary vascular congestion    Chronic diastolic CHF (congestive heart failure) ([place])    Acute hypoxemic respiratory failure ([place])  12/06/2017   Permanent atrial fibrillation ([place]) 12/06/2017   Trochanteric bursitis, right hip 07/16/2017   Other intervertebral disc degeneration, lumbar region 07/16/2017   Osteoporosis 11/04/2016   Diabetic polyneuropathy associated with type 2 diabetes mellitus ([place]) 11/14/2014   Fibromyalgia 02/11/2013   CVA (cerebral vascular accident) ([place]) 08/01/2008   Osteoarthritis 04/09/2007   Hyperlipidemia associated with type 2 diabetes mellitus ([place]) 03/08/2007   Hypertension associated with diabetes ([place]) 03/08/2007   Pulmonary fibrosis ([place]) 03/08/2007   GERD 03/08/2007   BREAST CANCER, HX OF 03/08/2007    Orientation RESPIRATION BLADDER Height & Weight     Self, Situation  O2 (2L) Incontinent, External catheter Weight: 128 lb 4.9 oz (58.2 kg) Height:  '5\' 3"'$  (160 cm)  BEHAVIORAL SYMPTOMS/MOOD NEUROLOGICAL BOWEL NUTRITION STATUS      Continent Diet (Heart healthy)  AMBULATORY STATUS COMMUNICATION OF NEEDS Skin   Extensive Assist Verbally Normal                       Personal Care Assistance Level of Assistance  Bathing, Feeding, Dressing Bathing Assistance: Limited assistance Feeding assistance: Independent Dressing Assistance: Limited assistance     Functional Limitations Info  Sight, Hearing, Speech Sight Info: Adequate Hearing Info: Adequate Speech Info: Adequate    SPECIAL CARE FACTORS FREQUENCY                       Contractures Contractures Info: Not present    Additional Factors Info  Code Status, Allergies Code Status Info: DNR Allergies Info: Diltiazem Hcl, Duloxetine Hcl, Ace  Inhibitors           Current Medications (05/12/2022):  This is the current hospital active medication list Current Facility-Administered Medications  Medication Dose Route Frequency Provider Last Rate Last Admin   acetaminophen (TYLENOL) tablet 650 mg  650 mg Oral Q6H PRN Elgergawy, Silver Huguenin, MD       Or   acetaminophen (TYLENOL) suppository 650 mg  650 mg Rectal  Q6H PRN Elgergawy, Silver Huguenin, MD       albuterol (PROVENTIL) (2.5 MG/3ML) 0.083% nebulizer solution 2.5 mg  2.5 mg Nebulization Q2H PRN Elgergawy, Silver Huguenin, MD   2.5 mg at 05/10/22 0611   atorvastatin (LIPITOR) tablet 80 mg  80 mg Oral QHS Elgergawy, Silver Huguenin, MD   80 mg at 05/11/22 2118   cefTRIAXone (ROCEPHIN) 2 g in sodium chloride 0.9 % 100 mL IVPB  2 g Intravenous Q24H Elgergawy, Silver Huguenin, MD 200 mL/hr at 05/11/22 1633 2 g at 05/11/22 1633   cholecalciferol (VITAMIN D3) 25 MCG (1000 UNIT) tablet 2,000 Units  2,000 Units Oral q AM Elgergawy, Silver Huguenin, MD   2,000 Units at 05/12/22 0603   cloNIDine (CATAPRES) tablet 0.1 mg  0.1 mg Oral BID Elgergawy, Silver Huguenin, MD   0.1 mg at 05/12/22 0953   docusate sodium (COLACE) capsule 100 mg  100 mg Oral Daily Elgergawy, Silver Huguenin, MD   100 mg at 05/12/22 0953   doxycycline (VIBRAMYCIN) 100 mg in sodium chloride 0.9 % 250 mL IVPB  100 mg Intravenous Q12H Elgergawy, Silver Huguenin, MD 125 mL/hr at 05/12/22 0556 100 mg at 05/12/22 0556   heparin injection 5,000 Units  5,000 Units Subcutaneous Q8H Elgergawy, Silver Huguenin, MD   5,000 Units at 05/12/22 0546   hydrALAZINE (APRESOLINE) tablet 50 mg  50 mg Oral TID Shawna Clamp, MD   50 mg at 05/12/22 0867   hydrochlorothiazide (HYDRODIURIL) tablet 50 mg  50 mg Oral Daily Elgergawy, Silver Huguenin, MD   50 mg at 05/12/22 0957   insulin aspart (novoLOG) injection 0-5 Units  0-5 Units Subcutaneous QHS Elgergawy, Silver Huguenin, MD       insulin aspart (novoLOG) injection 0-9 Units  0-9 Units Subcutaneous TID WC Elgergawy, Silver Huguenin, MD   1 Units at 05/11/22 1734   ipratropium-albuterol (DUONEB) 0.5-2.5 (3) MG/3ML nebulizer solution 3 mL  3 mL Nebulization Q6H Shawna Clamp, MD   3 mL at 05/12/22 0912   magnesium oxide (MAG-OX) tablet 200 mg  200 mg Oral Daily Elgergawy, Silver Huguenin, MD   200 mg at 05/12/22 6195   magnesium sulfate IVPB 2 g 50 mL  2 g Intravenous Once Shawna Clamp, MD 50 mL/hr at 05/12/22 0959 2 g at 05/12/22 0959    mirtazapine (REMERON) tablet 30 mg  30 mg Oral QHS Elgergawy, Silver Huguenin, MD   30 mg at 05/11/22 2118   polyethylene glycol (MIRALAX / GLYCOLAX) packet 17 g  17 g Oral Daily PRN Elgergawy, Silver Huguenin, MD       rOPINIRole (REQUIP) tablet 0.25 mg  0.25 mg Oral TID Elgergawy, Silver Huguenin, MD   0.25 mg at 05/12/22 0932   sertraline (ZOLOFT) tablet 100 mg  100 mg Oral Daily Elgergawy, Silver Huguenin, MD   100 mg at 05/12/22 6712     Discharge Medications: Please see discharge summary for a list of discharge medications.  Relevant Imaging Results:  Relevant Lab Results:   Additional Information SSN: [id number] [place]

## 2022-05-12 NOTE — TOC Transition Note (Signed)
Transition of Care [facility]) - CM/SW Discharge Note   Patient Details  Name: [name] MRN: [id number] Date of Birth: [birth year]  Transition of Care [facility]) CM/SW Contact:  Iona Beard, [place] Phone Number: 05/12/2022, 12:09 PM  Clinical Narrative:    CSW updated that pt is medically ready for D/C back to LTC at [facility]. CSW spoke to [place] in admissions who states they are ready for pt to return. CSW provided RN with numbers for room and report. Med necessity sent to the floor. EMS to be called. CSW spoke with pts son [name] to provide update on D/C. TOC signing off.   Final next level of care: Long Term Nursing Home Barriers to Discharge: Barriers Resolved   Patient Goals and CMS Choice CMS Medicare.gov Compare Post Acute Care list provided to:: Patient Represenative (must comment) Choice offered to / list presented to : Patient, Adult Children  Discharge Placement                Patient chooses bed at: [facility] Patient to be transferred to facility by: EMS Name of family member notified: Kemiya Batdorf Patient and family notified of of transfer: 05/12/22  Discharge Plan and Services Additional resources added to the After Visit Summary for   In-house Referral: Clinical Social Work Discharge Planning Services: CM Consult Post Acute Care Choice: Nursing Home                               Social Determinants of Health (SDOH) Interventions SDOH Screenings   Food Insecurity: No Food Insecurity (05/09/2022)  Housing: Low Risk  (05/09/2022)  Transportation Needs: No Transportation Needs (05/09/2022)  Utilities: Not At Risk (05/09/2022)  Alcohol Screen: Low Risk  (07/16/2018)  Depression (PHQ2-9): Medium Risk (04/12/2021)  Financial Resource Strain: Low Risk  (07/16/2018)  Physical Activity: Inactive (04/12/2021)  Social Connections: Moderately Isolated (08/17/2018)  Stress: Stress Concern Present (04/12/2021)  Tobacco Use: Low Risk  (05/08/2022)      Readmission Risk Interventions    05/09/2022   11:10 AM  Readmission Risk Prevention Plan  Transportation Screening Complete  Home Care Screening Complete  Medication Review (RN CM) Complete

## 2022-05-12 NOTE — Discharge Summary (Signed)
Physician Discharge Summary  [name] ZOX:[id number] DOB: 21-Sep-1925 DOA: 05/08/2022  PCP: Jodi Marble, MD  Admit date: 05/08/2022  Discharge date: 05/12/2022  Admitted From: [facility]. [facility]  Recommendations for Outpatient Follow-up:  Follow up with PCP in 1-2 weeks. Please obtain BMP/CBC in one week. Advised to continue current medications as prescribed. She has completed antibiotics for 5 days for community-acquired pneumonia. Advised to take Keflex 500 mg twice daily for 3 more days for UTI.  Home Health:None Equipment/Devices:Home oxygen @ 2L/min  Discharge Condition: Stable CODE STATUS:DNR Diet recommendation: Heart Healthy   Brief Summary/ Hospital Course: This 89 years old female with history of CVA, atrial fibrillation not on anticoagulation due to the age and fall risk, hyperlipidemia, moderate carotid artery disease, pulmonary fibrosis, diastolic CHF, breast cancer presented in the ED for shortness of breath, chest congestion and productive cough. Patient was started on doxycycline to treat pneumonia at nursing home without improvement.  She does not ambulate at baseline secondary to prior knee injury from fall.  She was noted to have hypoxia with SpO2 of 85% on room air with EMS which has improved to 96% on 2 L.  Patient also reports intermittent dysuria as well for last few weeks.  She also has umbilical hernia and she is scheduled to have repair as an outpatient. In the ED her workup is significant for BNP mildly elevated at 421, creatinine 1.36, troponin 110> 93, COVID, RSV and Influenza negative. Rhino virus +,  CTA chest was obtained which is significant for groundglass opacities in the left lower lung with surrounding lymphadenopathy.  Patient is admitted for further evaluation. Patient was continued on IV antibiotics and has completed 5 days course.  Patient continued to remain on supplemental oxygen 2 L.  She was started on Keflex for UTI.   Patient feels much better and wants to be discharged.  [facility]  Discharge Diagnoses:  Principal Problem:   Acute respiratory failure with hypoxia (HCC) Active Problems:   Hyperlipidemia associated with type 2 diabetes mellitus ([place])   Hypertension associated with diabetes ([place])   Pulmonary fibrosis (HCC)   GERD   Permanent atrial fibrillation (HCC)   Chronic diastolic CHF (congestive heart failure) (HCC)   Type 2 diabetes mellitus with diabetic neuropathy, unspecified (HCC)  Acute hypoxic respiratory failure secondary to CAP: Patient presented with productive cough, chest congestion and shortness of breath. She is found to have hypoxia with SpO2 of 85% on room air after EMS arrived requiring 2 L of supplemental oxygen to maintain saturation above 90%. CTA chest ruled out PE but shows findings consistent with pneumonia. Continue doxycycline and IV Rocephin x 5 days. Procalcitonin 8.32 consistent with bacterial pneumonia Influenza, COVID, RSV negative, Rhino virus +, strep pneumo antigen negative. Follow-up Legionella antigen and sputum cultures. Continue supplemental oxygen and wean as tolerated. She has completed 5-day antibiotics treatment for CAP.   Prolonged Qtc: Avoid QT prolonging medications. Repeat EKG showed improved QTC   Chronic atrial fibrillation: Heart rate is well-controlled.   Anticoagulation has been discontinued recently due to multiple falls,  Not a candidate for anticoagulation.   Essential hypertension: Continue hydralazine, clonidine, hydrochlorothiazide.   Type 2 diabetes: Resume home diabetic medications.   Hyperlipidemia: Continue Lipitor   Abnormal imaging of left kidney: Radiologist recommended confirmatory study to exclude mass lesion when clinically appropriate.   Depression/ Anxiety: Continue Remeron, Zoloft.   UTI: UA consistent with UTI.  Continue ceftriaxone.   Discharged on  Keflex for 3 days.  Discharge Instructions  Discharge Instructions     Call MD for:  difficulty breathing, headache or visual disturbances   Complete by: As directed    Call MD for:  persistant dizziness or light-headedness   Complete by: As directed    Call MD for:  persistant nausea and vomiting   Complete by: As directed    Diet - low sodium heart healthy   Complete by: As directed    Diet Carb Modified   Complete by: As directed    Discharge instructions   Complete by: As directed    Advised to follow-up with primary care physician in 1 week. Advised to continue current medications as prescribed. She has completed antibiotics for 5 days for community-acquired pneumonia.   Increase activity slowly   Complete by: As directed       Allergies as of 05/12/2022       Reactions   Diltiazem Hcl Itching, Rash, Other (See Comments)   Red itchy rash started a few hours after taking short acting '120mg'$  dilt tabs   Duloxetine Hcl Anxiety, Rash, Other (See Comments)   Makes patient feel "restless and scared" and unable to sleep   Ace Inhibitors Cough        Medication List     STOP taking these medications    doxycycline 100 MG tablet Commonly known as: VIBRA-TABS       TAKE these medications    acetaminophen 325 MG tablet Commonly known as: TYLENOL Take 650 mg by mouth every 6 (six) hours as needed (pain).   atorvastatin 80 MG tablet Commonly known as: LIPITOR Take 1 tablet (80 mg total) by mouth at bedtime.   celecoxib 100 MG capsule Commonly known as: CELEBREX Take 100 mg by mouth 2 (two) times daily.   cephALEXin 500 MG capsule Commonly known as: KEFLEX Take 1 capsule (500 mg total) by mouth 2 (two) times daily for 10 days.   cloNIDine 0.1 MG tablet Commonly known as: CATAPRES Take 0.1 mg by mouth 2 (two) times daily.   D-Mannose 500 MG Caps Take 2,000 mg by mouth daily.   docusate sodium 100 MG capsule Commonly known as: COLACE Take 100 mg by mouth  daily.   estradiol 0.1 MG/GM vaginal cream Commonly known as: ESTRACE Place 1 Applicatorful vaginally See admin instructions. Apply 0.5g at bedtime on Mondays and Thursdays only.   fluticasone 50 MCG/ACT nasal spray Commonly known as: FLONASE Place 1 spray into both nostrils daily.   hydrALAZINE 25 MG tablet Commonly known as: APRESOLINE Take 1 tablet (25 mg total) by mouth 3 (three) times daily.   hydrochlorothiazide 25 MG tablet Commonly known as: HYDRODIURIL Take 50 mg by mouth daily.   HYDROcodone-acetaminophen 5-325 MG tablet Commonly known as: NORCO/VICODIN Take 1 tablet by mouth See admin instructions. Take 1 tablet every morning. Take 1 tablet every 8 hours as needed for pain.   ipratropium-albuterol 0.5-2.5 (3) MG/3ML Soln Commonly known as: DUONEB Take 3 mLs by nebulization every 6 (six) hours as needed (wheezing/SOB).   Levemir FlexTouch 100 UNIT/ML FlexPen Generic drug: insulin detemir Inject 6-9 Units into the skin See admin instructions. Inject 9 units in the morning and 6 units at bedtime.   Magnesium 250 MG Tabs Take 250 mg by mouth daily.   metFORMIN 500 MG tablet Commonly known as: GLUCOPHAGE Take 1 tablet (500 mg total) by mouth daily with breakfast.   mirtazapine 30 MG tablet  Commonly known as: REMERON Take 1 tablet by mouth at bedtime.   NovoLOG FlexPen 100 UNIT/ML FlexPen Generic drug: insulin aspart Inject 0-10 Units into the skin See admin instructions. Sliding scale: CBG 1 to 150=0 units, 151-200=2 units, 201-250=4 units, 251-300=6 units, 301-350=8 units, 351-400=10 units >400 call Dr < 70 call Dr   ondansetron 4 MG tablet Commonly known as: ZOFRAN Take 1 tablet (4 mg total) by mouth every 8 (eight) hours as needed for nausea or vomiting.   polyethylene glycol 17 g packet Commonly known as: MIRALAX / GLYCOLAX Take 17 g by mouth daily as needed (constipation).   potassium chloride 10 MEQ tablet Commonly known as: KLOR-CON M Take 40 mEq by  mouth 2 (two) times daily.   Prevagen Extra Strength 20 MG Caps Generic drug: Apoaequorin Take 20 mg by mouth daily.   rOPINIRole 0.25 MG tablet Commonly known as: REQUIP Take 0.25 mg by mouth 3 (three) times daily.   sertraline 100 MG tablet Commonly known as: ZOLOFT Take 100 mg by mouth daily.   Vitamin D3 50 MCG (2000 UT) Tabs Take 2,000 Units by mouth in the morning.               Durable Medical Equipment  (From admission, onward)           Start     Ordered   05/12/22 1011  DME Oxygen  Once       Question Answer Comment  Length of Need Lifetime   Mode or (Route) Nasal cannula   Liters per Minute 2   Frequency Continuous (stationary and portable oxygen unit needed)   Oxygen conserving device Yes   Oxygen delivery system Gas      05/12/22 1011            Follow-up Information     Jodi Marble, MD Follow up in 1 week(s).   Specialty: Internal Medicine Contact information: [place] [address] [phone]                Allergies  Allergen Reactions   Diltiazem Hcl Itching, Rash and Other (See Comments)    Red itchy rash started a few hours after taking short acting '120mg'$  dilt tabs    Duloxetine Hcl Anxiety, Rash and Other (See Comments)    Makes patient feel "restless and scared" and unable to sleep   Ace Inhibitors Cough    Consultations: None   Procedures/Studies: CT Angio Chest PE W and/or Wo Contrast  Result Date: 05/08/2022 CLINICAL DATA:  Shortness of breath and cough. Recent history of pneumonia EXAM: CT ANGIOGRAPHY CHEST WITH CONTRAST TECHNIQUE: Multidetector CT imaging of the chest was performed using the standard protocol during bolus administration of intravenous contrast. Multiplanar CT image reconstructions and MIPs were obtained to evaluate the vascular anatomy. RADIATION DOSE REDUCTION: This exam was performed according to the departmental dose-optimization program which includes automated  exposure control, adjustment of the mA and/or kV according to patient size and/or use of iterative reconstruction technique. CONTRAST:  68m OMNIPAQUE IOHEXOL 350 MG/ML SOLN COMPARISON:  X-ray 05/08/2022.  CT scan 04/09/2020. FINDINGS: Cardiovascular: Enlarged heart. The thoracic aorta otherwise normal course and caliber with diffuse vascular calcifications and atherosclerotic change. Coronary artery calcifications are also seen. No significant pericardial effusion. Motion is seen throughout the examination which can limit evaluation of small peripheral emboli. There is some enlargement of the pulmonary arterial tree. Please correlate for evidence of pulmonary hypertension. No segmental or larger pulmonary embolism  identified. Mediastinum/Nodes: No specific abnormal lymph node enlargement present in the axillary region. There is an enlarged left hilar lymph node on series 4, image 38 measuring 16 x 14 mm which is new from prior CT scan. No significant right hilar lymph node enlargement. Some prominent mediastinal nodes are seen. Pretracheal node on image 29 of series 4 measures 15 by 13 mm. On the prior study this node would have measured 13 by 9 mm. Normal caliber thoracic esophagus which is slightly patulous. Small hiatal hernia. Small thyroid gland. Lungs/Pleura: Breathing motion seen throughout the examination. There is some dependent areas of nonspecific ground-glass along both lungs diffusely with some interstitial septal thickening. Bronchiectasis noted in the lower lobes with some areas opacity most circum bronchi. Prominent calcifications diffusely of the tracheobronchial tree. No areas of consolidation at this time. Upper Abdomen: Along the upper abdomen the adrenal glands are preserved. Gallbladder is present. There is a lobular appearance to the left kidney at the edge of the imaging field on the very last image. This could be technical although lesion is not excluded recommend dedicated workup when  appropriate just a contrast CT of the abdomen and pelvis. Musculoskeletal: Diffuse degenerative changes are seen including the shoulders and spine. Osteopenia. Slight curvature of the spine as well. Review of the MIP images confirms the above findings. IMPRESSION: Breathing motion. No segmental or larger pulmonary embolism identified. There is some enlargement of the pulmonary arteries which can be seen with pulmonary hypertension. Diffuse areas peribronchial thickening and opacity along areas of bronchi particularly in the lower lobes. Peripheral areas of interstitial changes and ground-glass. Development of some mildly enlarged mediastinal and left hilar lymph nodes of uncertain etiology and significance. Recommend further workup or short follow-up. Lobular appearance to the left kidney at the edge of the imaging field could represent volume averaging on the chest CT however recommend confirmatory study to exclude mass lesion when clinically appropriate Aortic Atherosclerosis (ICD10-I70.0) and Emphysema (ICD10-J43.9). Electronically Signed   By: Jill Side M.D.   On: 05/08/2022 17:29   DG Chest Port 1 View  Result Date: 05/08/2022 CLINICAL DATA:  Shortness of breath EXAM: PORTABLE CHEST 1 VIEW COMPARISON:  CT 04/09/2020.  X-ray 07/13/2019 and older FINDINGS: Who was rotated to the left. Diffuse interstitial changes are seen which is likely chronic. No consolidation, pneumothorax or effusion. Borderline cardiopericardial silhouette. Calcified tortuous aorta. Overlapping cardiac leads. Left axillary surgical clips. Degenerative changes of both shoulders. IMPRESSION: Rotated radiograph with chronic lung changes. Borderline size heart and similar to previous. Electronically Signed   By: Jill Side M.D.   On: 05/08/2022 14:09     Subjective: Patient was seen and examined at bedside.  Overnight events noted.  Patient reports doing much better and wants to be discharged.  She remains on 2 L of supplemental  oxygen.  Discharge Exam: Vitals:   05/12/22 0343 05/12/22 0914  BP: (!) 178/58   Pulse: 70   Resp: 20   Temp: 97.9 F (36.6 C)   SpO2: 96% 96%   Vitals:   05/11/22 2112 05/12/22 0203 05/12/22 0343 05/12/22 0914  BP: (!) 157/58  (!) 178/58   Pulse: (!) 58  70   Resp: 20  20   Temp: 98.6 F (37 C)  97.9 F (36.6 C)   TempSrc: Oral  Oral   SpO2: 98% 95% 96% 96%  Weight:      Height:        General: Pt is alert, awake, not in acute  distress Cardiovascular: RRR, S1/S2 +, no rubs, no gallops Respiratory: CTA bilaterally, no wheezing, no rhonchi Abdominal: Soft, NT, ND, bowel sounds + Extremities: no edema, no cyanosis    The results of significant diagnostics from this hospitalization (including imaging, microbiology, ancillary and laboratory) are listed below for reference.     Microbiology: Recent Results (from the past 240 hour(s))  Resp panel by RT-PCR (RSV, Flu A&B, Covid) Anterior Nasal Swab     Status: None   Collection Time: 05/08/22  1:43 PM   Specimen: Anterior Nasal Swab  Result Value Ref Range Status   SARS Coronavirus 2 by RT PCR NEGATIVE NEGATIVE Final    Comment: (NOTE) SARS-CoV-2 target nucleic acids are NOT DETECTED.  The SARS-CoV-2 RNA is generally detectable in upper respiratory specimens during the acute phase of infection. The lowest concentration of SARS-CoV-2 viral copies this assay can detect is 138 copies/mL. A negative result does not preclude SARS-Cov-2 infection and should not be used as the sole basis for treatment or other patient management decisions. A negative result may occur with  improper specimen collection/handling, submission of specimen other than nasopharyngeal swab, presence of viral mutation(s) within the areas targeted by this assay, and inadequate number of viral copies(<138 copies/mL). A negative result must be combined with clinical observations, patient history, and epidemiological information. The expected result is  Negative.  Fact Sheet for Patients:  EntrepreneurPulse.com.au  Fact Sheet for Healthcare Providers:  IncredibleEmployment.be  This test is no t yet approved or cleared by the [place] FDA and  has been authorized for detection and/or diagnosis of SARS-CoV-2 by FDA under an Emergency Use Authorization (EUA). This EUA will remain  in effect (meaning this test can be used) for the duration of the COVID-19 declaration under Section 564(b)(1) of the Act, 21 U.S.C.section 360bbb-3(b)(1), unless the authorization is terminated  or revoked sooner.       Influenza A by PCR NEGATIVE NEGATIVE Final   Influenza B by PCR NEGATIVE NEGATIVE Final    Comment: (NOTE) The Xpert Xpress SARS-CoV-2/FLU/RSV plus assay is intended as an aid in the diagnosis of influenza from Nasopharyngeal swab specimens and should not be used as a sole basis for treatment. Nasal washings and aspirates are unacceptable for Xpert Xpress SARS-CoV-2/FLU/RSV testing.  Fact Sheet for Patients: EntrepreneurPulse.com.au  Fact Sheet for Healthcare Providers: IncredibleEmployment.be  This test is not yet approved or cleared by the [place] FDA and has been authorized for detection and/or diagnosis of SARS-CoV-2 by FDA under an Emergency Use Authorization (EUA). This EUA will remain in effect (meaning this test can be used) for the duration of the COVID-19 declaration under Section 564(b)(1) of the Act, 21 U.S.C. section 360bbb-3(b)(1), unless the authorization is terminated or revoked.     Resp Syncytial Virus by PCR NEGATIVE NEGATIVE Final    Comment: (NOTE) Fact Sheet for Patients: EntrepreneurPulse.com.au  Fact Sheet for Healthcare Providers: IncredibleEmployment.be  This test is not yet approved or cleared by the [place] FDA and has been authorized for detection and/or diagnosis of  SARS-CoV-2 by FDA under an Emergency Use Authorization (EUA). This EUA will remain in effect (meaning this test can be used) for the duration of the COVID-19 declaration under Section 564(b)(1) of the Act, 21 U.S.C. section 360bbb-3(b)(1), unless the authorization is terminated or revoked.  Performed at [facility], [address], [place], [place] [address]   Respiratory (~20 pathogens) panel by PCR     Status: Abnormal   Collection Time: 05/08/22  6:31 PM   Specimen: Nasopharyngeal Swab; Respiratory  Result Value Ref Range Status   Adenovirus NOT DETECTED NOT DETECTED Final   Coronavirus 229E NOT DETECTED NOT DETECTED Final    Comment: (NOTE) The Coronavirus on the Respiratory Panel, DOES NOT test for the novel  Coronavirus (2019 nCoV)    Coronavirus HKU1 NOT DETECTED NOT DETECTED Final   Coronavirus NL63 NOT DETECTED NOT DETECTED Final   Coronavirus OC43 NOT DETECTED NOT DETECTED Final   Metapneumovirus NOT DETECTED NOT DETECTED Final   Rhinovirus / Enterovirus DETECTED (A) NOT DETECTED Final   Influenza A NOT DETECTED NOT DETECTED Final   Influenza B NOT DETECTED NOT DETECTED Final   Parainfluenza Virus 1 NOT DETECTED NOT DETECTED Final   Parainfluenza Virus 2 NOT DETECTED NOT DETECTED Final   Parainfluenza Virus 3 NOT DETECTED NOT DETECTED Final   Parainfluenza Virus 4 NOT DETECTED NOT DETECTED Final   Respiratory Syncytial Virus NOT DETECTED NOT DETECTED Final   Bordetella pertussis NOT DETECTED NOT DETECTED Final   Bordetella Parapertussis NOT DETECTED NOT DETECTED Final   Chlamydophila pneumoniae NOT DETECTED NOT DETECTED Final   Mycoplasma pneumoniae NOT DETECTED NOT DETECTED Final    Comment: Performed at [facility], [place] [address], [place], [place] [address]     Labs: BNP (last 3 results) Recent Labs    05/08/22 1327  BNP 622.2*   Basic Metabolic Panel: Recent Labs  Lab 05/08/22 1327 05/09/22 0447 05/10/22 0422 05/11/22 0634 05/12/22 0342  NA  137 138 139  --  138  K 4.1 3.2* 3.1* 3.2* 3.5  CL 104 104 104  --  104  CO2 '23 25 26  '$ --  26  GLUCOSE 128* 83 91  --  96  BUN 35* 32* 22  --  18  CREATININE 1.36* 0.95 0.69  --  0.74  CALCIUM 8.3* 8.2* 8.3*  --  8.6*  MG  --   --  1.7  --  1.6*  PHOS  --   --  3.2  --  2.8   Liver Function Tests: No results for input(s): "AST", "ALT", "ALKPHOS", "BILITOT", "PROT", "ALBUMIN" in the last 168 hours. No results for input(s): "LIPASE", "AMYLASE" in the last 168 hours. No results for input(s): "AMMONIA" in the last 168 hours. CBC: Recent Labs  Lab 05/08/22 1327 05/09/22 0447 05/10/22 0422 05/12/22 0342  WBC 7.8 6.3 6.3 5.9  NEUTROABS 4.6  --   --   --   HGB 10.2* 10.5* 10.3* 10.1*  HCT 34.9* 35.9* 34.8* 34.0*  MCV 81.4 82.2 80.7 80.4  PLT 163 154 161 164   Cardiac Enzymes: No results for input(s): "CKTOTAL", "CKMB", "CKMBINDEX", "TROPONINI" in the last 168 hours. BNP: Invalid input(s): "POCBNP" CBG: Recent Labs  Lab 05/11/22 1235 05/11/22 1704 05/11/22 2115 05/12/22 0802 05/12/22 1204  GLUCAP 141* 137* 113* 90 106*   D-Dimer No results for input(s): "DDIMER" in the last 72 hours. Hgb A1c No results for input(s): "HGBA1C" in the last 72 hours. Lipid Profile No results for input(s): "CHOL", "HDL", "LDLCALC", "TRIG", "CHOLHDL", "LDLDIRECT" in the last 72 hours. Thyroid function studies No results for input(s): "TSH", "T4TOTAL", "T3FREE", "THYROIDAB" in the last 72 hours.  Invalid input(s): "FREET3" Anemia work up No results for input(s): "VITAMINB12", "FOLATE", "FERRITIN", "TIBC", "IRON", "RETICCTPCT" in the last 72 hours. Urinalysis    Component Value Date/Time   COLORURINE YELLOW 05/08/2022 1823   APPEARANCEUR CLOUDY (A) 05/08/2022 1823   APPEARANCEUR Cloudy (A) 02/24/2020 9798  LABSPEC >1.046 (H) 05/08/2022 1823   PHURINE 5.0 05/08/2022 1823   GLUCOSEU NEGATIVE 05/08/2022 1823   HGBUR SMALL (A) 05/08/2022 1823   BILIRUBINUR NEGATIVE 05/08/2022 1823    BILIRUBINUR Negative 02/24/2020 0908   KETONESUR NEGATIVE 05/08/2022 1823   PROTEINUR 100 (A) 05/08/2022 1823   UROBILINOGEN negative 07/03/2015 1351   UROBILINOGEN 1.0 05/14/2007 1320   NITRITE NEGATIVE 05/08/2022 1823   LEUKOCYTESUR LARGE (A) 05/08/2022 1823   Sepsis Labs Recent Labs  Lab 05/08/22 1327 05/09/22 0447 05/10/22 0422 05/12/22 0342  WBC 7.8 6.3 6.3 5.9   Microbiology Recent Results (from the past 240 hour(s))  Resp panel by RT-PCR (RSV, Flu A&B, Covid) Anterior Nasal Swab     Status: None   Collection Time: 05/08/22  1:43 PM   Specimen: Anterior Nasal Swab  Result Value Ref Range Status   SARS Coronavirus 2 by RT PCR NEGATIVE NEGATIVE Final    Comment: (NOTE) SARS-CoV-2 target nucleic acids are NOT DETECTED.  The SARS-CoV-2 RNA is generally detectable in upper respiratory specimens during the acute phase of infection. The lowest concentration of SARS-CoV-2 viral copies this assay can detect is 138 copies/mL. A negative result does not preclude SARS-Cov-2 infection and should not be used as the sole basis for treatment or other patient management decisions. A negative result may occur with  improper specimen collection/handling, submission of specimen other than nasopharyngeal swab, presence of viral mutation(s) within the areas targeted by this assay, and inadequate number of viral copies(<138 copies/mL). A negative result must be combined with clinical observations, patient history, and epidemiological information. The expected result is Negative.  Fact Sheet for Patients:  EntrepreneurPulse.com.au  Fact Sheet for Healthcare Providers:  IncredibleEmployment.be  This test is no t yet approved or cleared by the [place] FDA and  has been authorized for detection and/or diagnosis of SARS-CoV-2 by FDA under an Emergency Use Authorization (EUA). This EUA will remain  in effect (meaning this test can be used) for the  duration of the COVID-19 declaration under Section 564(b)(1) of the Act, 21 U.S.C.section 360bbb-3(b)(1), unless the authorization is terminated  or revoked sooner.       Influenza A by PCR NEGATIVE NEGATIVE Final   Influenza B by PCR NEGATIVE NEGATIVE Final    Comment: (NOTE) The Xpert Xpress SARS-CoV-2/FLU/RSV plus assay is intended as an aid in the diagnosis of influenza from Nasopharyngeal swab specimens and should not be used as a sole basis for treatment. Nasal washings and aspirates are unacceptable for Xpert Xpress SARS-CoV-2/FLU/RSV testing.  Fact Sheet for Patients: EntrepreneurPulse.com.au  Fact Sheet for Healthcare Providers: IncredibleEmployment.be  This test is not yet approved or cleared by the [place] FDA and has been authorized for detection and/or diagnosis of SARS-CoV-2 by FDA under an Emergency Use Authorization (EUA). This EUA will remain in effect (meaning this test can be used) for the duration of the COVID-19 declaration under Section 564(b)(1) of the Act, 21 U.S.C. section 360bbb-3(b)(1), unless the authorization is terminated or revoked.     Resp Syncytial Virus by PCR NEGATIVE NEGATIVE Final    Comment: (NOTE) Fact Sheet for Patients: EntrepreneurPulse.com.au  Fact Sheet for Healthcare Providers: IncredibleEmployment.be  This test is not yet approved or cleared by the [place] FDA and has been authorized for detection and/or diagnosis of SARS-CoV-2 by FDA under an Emergency Use Authorization (EUA). This EUA will remain in effect (meaning this test can be used) for the duration of the COVID-19 declaration under Section 564(b)(1) of the  Act, 21 U.S.C. section 360bbb-3(b)(1), unless the authorization is terminated or revoked.  Performed at [facility], [address], [place], [place] [address]   Respiratory (~20 pathogens) panel by PCR     Status: Abnormal    Collection Time: 05/08/22  6:31 PM   Specimen: Nasopharyngeal Swab; Respiratory  Result Value Ref Range Status   Adenovirus NOT DETECTED NOT DETECTED Final   Coronavirus 229E NOT DETECTED NOT DETECTED Final    Comment: (NOTE) The Coronavirus on the Respiratory Panel, DOES NOT test for the novel  Coronavirus (2019 nCoV)    Coronavirus HKU1 NOT DETECTED NOT DETECTED Final   Coronavirus NL63 NOT DETECTED NOT DETECTED Final   Coronavirus OC43 NOT DETECTED NOT DETECTED Final   Metapneumovirus NOT DETECTED NOT DETECTED Final   Rhinovirus / Enterovirus DETECTED (A) NOT DETECTED Final   Influenza A NOT DETECTED NOT DETECTED Final   Influenza B NOT DETECTED NOT DETECTED Final   Parainfluenza Virus 1 NOT DETECTED NOT DETECTED Final   Parainfluenza Virus 2 NOT DETECTED NOT DETECTED Final   Parainfluenza Virus 3 NOT DETECTED NOT DETECTED Final   Parainfluenza Virus 4 NOT DETECTED NOT DETECTED Final   Respiratory Syncytial Virus NOT DETECTED NOT DETECTED Final   Bordetella pertussis NOT DETECTED NOT DETECTED Final   Bordetella Parapertussis NOT DETECTED NOT DETECTED Final   Chlamydophila pneumoniae NOT DETECTED NOT DETECTED Final   Mycoplasma pneumoniae NOT DETECTED NOT DETECTED Final    Comment: Performed at [facility], [address] [place], [place] [address]     Time coordinating discharge: Over 30 minutes  SIGNED:   Shawna Clamp, MD  [facility] 05/12/2022, 12:48 PM Pager   If 7PM-7AM, please contact night-coverage

## 2022-05-12 NOTE — Progress Notes (Signed)
[facility]

## 2022-05-12 NOTE — Progress Notes (Signed)
Patient appears to becoming confused . Saturation in 80's as oxygen had been pulled lose from wall ( 2nd time tonight). Oxygen saturation recover quickly on 2 liters. Wheezing and respirations had increased.

## 2022-05-13 ENCOUNTER — Telehealth: Payer: Self-pay | Admitting: Family Medicine

## 2022-05-13 ENCOUNTER — Encounter: Payer: Self-pay | Admitting: Family Medicine

## 2022-05-13 ENCOUNTER — Non-Acute Institutional Stay: Payer: PPO | Admitting: Family Medicine

## 2022-05-13 VITALS — BP 114/46 | HR 59 | Temp 98.7°F | Resp 22

## 2022-05-13 DIAGNOSIS — Z9189 Other specified personal risk factors, not elsewhere classified: Secondary | ICD-10-CM | POA: Insufficient documentation

## 2022-05-13 DIAGNOSIS — I5032 Chronic diastolic (congestive) heart failure: Secondary | ICD-10-CM | POA: Diagnosis not present

## 2022-05-13 DIAGNOSIS — Z7409 Other reduced mobility: Secondary | ICD-10-CM | POA: Diagnosis not present

## 2022-05-13 DIAGNOSIS — J9601 Acute respiratory failure with hypoxia: Secondary | ICD-10-CM

## 2022-05-13 DIAGNOSIS — R634 Abnormal weight loss: Secondary | ICD-10-CM | POA: Diagnosis not present

## 2022-05-13 DIAGNOSIS — Z515 Encounter for palliative care: Secondary | ICD-10-CM | POA: Insufficient documentation

## 2022-05-13 DIAGNOSIS — R41841 Cognitive communication deficit: Secondary | ICD-10-CM | POA: Diagnosis not present

## 2022-05-13 DIAGNOSIS — J206 Acute bronchitis due to rhinovirus: Secondary | ICD-10-CM

## 2022-05-13 DIAGNOSIS — M6281 Muscle weakness (generalized): Secondary | ICD-10-CM | POA: Diagnosis not present

## 2022-05-13 HISTORY — DX: Acute bronchitis due to Rhinovirus: J20.6

## 2022-05-13 LAB — LEGIONELLA PNEUMOPHILA SEROGP 1 UR AG: L. pneumophila Serogp 1 Ur Ag: NEGATIVE

## 2022-05-13 NOTE — Telephone Encounter (Signed)
05/13/22 12:30 pm TCT update son on current condition and changes in plan of care, discuss goals of care. Discussed hypoxia today despite O2 and requiring increased amount of O2 with addition of Albuterol neb treatments as needed, O2 titration to keep sats >/= 94%.  Advised that we will be contacting the surgeon who plans to do the hernia repair to alert to recent viral illness and need for O2 to see if he/she wants to delay surgery at present.  Advised that pt has had 26 lb weight loss since May and that currently am ordering nutritional supplement, had finding of patulous esophagus, what that means and how it can be an avenue for aspiration.  Advised ST eval to check on swallow.  Advised what a prolonged QT means and how magnesium affects it so that she will receive additional supplement to boost her level.  Asked about goals of care and whether he may want to still pursue having hernia repair if pt is declining but indicated that facility NP was talking about pt c/o pain at hernia site for last 6 months.  Advised if pt does not improve soon she may be qualified for Hospice Level of care.  He reports satisfaction in prior dealings with hospice care.  Plan is to wait and see over the next few weeks if pt improves or continues declining.  Advised if there are changes and he wants to investigate hospice services that facility NP would need to say that praaif pt continues on her present course that she would likely have a life expectancy of 6 months or less.  He verbalized understanding and will contact this provider.   Damaris Hippo FNP-C

## 2022-05-13 NOTE — Progress Notes (Signed)
[facility] Pall[facility] Consult Note Telephone: [phone]  Fax: [phone]    Date of encounter: 05/13/22 10:30 AM  PATIENT NAME: [name] [facility] Room [address] [place] [address]   [phone] (home)  DOB: 28-Aug-1925 MRN: [id number] PRIMARY CARE PROVIDER:    Jodi Marble, MD,  [place] [address] [phone]  REFERRING PROVIDER:   Jodi Marble, MD [address] [place],  [place] [address] [phone]  RESPONSIBLE PARTY:    Contact Information     Name Relation Home Work [place] Son [phone]  [phone]   Joan, [name]   [phone]        I met face to face with patient in [facility] facility. Palliative Care was asked to follow this patient by consultation request of  Jodi Marble, MD to address advance care planning and complex medical decision making. This is an initial SNF visit.                             ASSESSMENT, SYMPTOM MANAGEMENT AND PLAN / RECOMMENDATION Acute hypoxic respiratory failure related to acute bronchitis due to rhinovirus infection and CAP Recommend Albuterol nebs q 6 hrs prn wheezing, sob, cough Titrate O2 to keep sats 90-92% or above.  2.  Chronic Diastolic Heart Failure Elevated troponin/BNP inpatient with HCTZ 50 mg daily Recommend Lasix 20 mg daily x 3, hold HCTZ during this time Continue K+ supplement 10 mEq -4 tabs daily  3.  Hypomagnesemia Has had prolonged Qtc. Mag 1.6 at hospital Increase Mag dose to 250 mg BID x 5 days. Repeat CMP and Mag in 1 week.  4.  At risk for aspiration with patulous esophagus Upright for all intake and for 30-60 minutes after eating. ST eval for appropriate diet recommendations  5. Abnormal weight loss Loss of 26 lbs since May 2023. Recommend checking Albumin next week with CMP Start House Supplement TID to increase protein, slow weight decline.  6.   Palliative Care Encounter Pt scheduled for hernia repair on 05/21/22-would advise delay if not cancellation depending on discussion of goals of care with family and facility attending.    Advance Care Planning/Goals of Care: Goals include to maximize quality of life and symptom management. \ Our advance care planning conversation included a discussion about:    The value and importance of advance care planning-MOST form completed and on file Identification of a healthcare agent is son [name] Review of an advance directive document-MOST. Decision not to resuscitate or to de-escalate disease focused treatments due to poor prognosis. CODE STATUS: MOST: DNR/DNI Limited intervention IV fluid and antibiotics if needed No feeding tube    Follow up Palliative Care Visit: Palliative care will continue to follow for complex medical decision making, advance care planning, and clarification of goals. Return    This visit was coded based on medical decision making (MDM).  PPS: 40%  HOSPICE ELIGIBILITY/DIAGNOSIS: TBD  Chief Complaint:  Palliative Care is following pt after hospital admission for acute hypoxemic respiratory failure due to community acquired pneumonia.  HISTORY OF PRESENT ILLNESS:  [name] is a 89 y.o. year old female admitted 1/4-1/824 for acute hypoxic respiratory failure due to community acquired pneumonia.  She had negative CTA pulmonary for PE, was noted to have some ground glass opacities in BLL and lymphadenopathy.  CTA also showed cardiomegaly and question of dilated pulmonary arteries  with concern for pulmonary hypertension and patulous esophagus.  She was noted to have elevated troponin 112 with repeat 90 and prolonged Qtc with Magnesium low at 1.6.  BNP was elevated at 421.  Covid, influenza A & B, RSV were all negative.  She was positive for rhinovirus and was believed to have secondary bacterial infection.  She was treated inpatient with 5 days IV Doxycycline and  Rocephin.  She required O2 @ 2L and requested d/c 05/12/22 continuing to require O2 support.  She had c/o dysuria at the hospital with UA showing small blood, large leukocytes and negative nitrites. She was d/c back to her facility on Keflex for another 3 days. D/C physician recommended PCP follow up in 1-2 weeks, obtain BMP, CBC in 1 week, and complete Keflex 500 mg BID x 3 days for UTI. She is scheduled to have a hernia repair done on 05/21/22.  Other comorbidities include hypertension, permanent atrial fibrillation without anticoagulation due to 89/renal function/fall risk/renal function/fall risk, hx of CVA, chronic diastolic CHF, pulmonary fibrosis with Oxygen dependence, hx of SAH and SDH. Blood sugars on return to facility 119-122.  Nursing at facility states pt refused to take her medications while nurse was in the room and she still had them at bedside. Per FL2 pt is continent of bowel, incontinent of bladder, requires assist with bathing and dressing and extensive assist for ambulation.  History obtained from review of EMR, discussion with facility staff and/or [name].      Latest Ref Rng & Units 05/12/2022    3:42 AM 05/10/2022    4:22 AM 05/09/2022    4:47 AM  CBC  WBC 4.0 - 10.5 K/uL 5.9  6.3  6.3   Hemoglobin 12.0 - 15.0 g/dL 10.1  10.3  10.5   Hematocrit 36.0 - 46.0 % 34.0  34.8  35.9   Platelets 150 - 400 K/uL 164  161  154        Latest Ref Rng & Units 05/12/2022    3:42 AM 05/11/2022    6:34 AM 05/10/2022    4:22 AM  CMP  Glucose 70 - 99 mg/dL 96   91   BUN 8 - 23 mg/dL 18   22   Creatinine 0.44 - 1.00 mg/dL 0.74   0.69   Sodium 135 - 145 mmol/L 138   139   Potassium 3.5 - 5.1 mmol/L 3.5  3.2  3.1   Chloride 98 - 111 mmol/L 104   104   CO2 22 - 32 mmol/L 26   26   Calcium 8.9 - 10.3 mg/dL 8.6   8.3   05/08/22 HGB A1c 6.3% 05/08/22 Troponin I high sensitivity 110 followed by 90 in 2 hrs. 05/08/22 BNP 421.0 05/08/22 Resp panel (influenza, covid, RSV) SARS Coronavirus 2 by RT PCR NEGATIVE NEGATIVE NEGATIVE  CM  Comment: (NOTE) SARS-CoV-2 target nucleic acids are NOT DETECTED.  The SARS-CoV-2 RNA is generally detectable in upper respiratory specimens during the acute phase of infection. The lowest concentration of SARS-CoV-2 viral copies this assay can detect is 138 copies/mL. A negative result does not preclude SARS-Cov-2 infection and should not be used as the sole basis for treatment or other patient management decisions. A negative result may occur with improper specimen collection/handling, submission of specimen other than nasopharyngeal swab, presence of viral mutation(s) within the areas targeted by this assay, and inadequate number of viral copies(<138 copies/mL). A negative result must be combined with clinical observations, patient history, and epidemiological information. The  expected result is Negative.  Fact Sheet for Patients: EntrepreneurPulse.com.au  Fact Sheet for Healthcare Providers: IncredibleEmployment.be  This test is not yet approved or cleared by the [place] FDA and has been authorized for detection and/or diagnosis of SARS-CoV-2 by FDA under an Emergency Use Authorization (EUA). This EUA will remain in effect (meaning this test can be used) for the duration of the COVID-19 declaration under Section 564(b)(1) of the Act, 21 U.S.C.section 360bbb-3(b)(1), unless the authorization is terminated or revoked sooner.    Influenza A by PCR NEGATIVE NEGATIVE NEGATIVE  Influenza B by PCR NEGATIVE NEGATIVE NEGATIVE CM  Comment: (NOTE) The Xpert Xpress SARS-CoV-2/FLU/RSV plus assay is intended as an aid in the diagnosis of influenza from Nasopharyngeal swab specimens and should not be used as a sole basis for treatment. Nasal washings and aspirates are unacceptable for Xpert Xpress SARS-CoV-2/FLU/RSV testing.  Fact Sheet for Patients: EntrepreneurPulse.com.au  Fact Sheet for Healthcare  Providers: IncredibleEmployment.be  This test is not yet approved or cleared by the [place] FDA and has been authorized for detection and/or diagnosis of SARS-CoV-2 by FDA under an Emergency Use Authorization (EUA). This EUA will remain in effect (meaning this test can be used) for the duration of the COVID-19 declaration under Section 564(b)(1) of the Act, 21 U.S.C. section 360bbb-3(b)(1), unless the authorization is terminated or revoked.   Resp Syncytial Virus by PCR NEGATIVE NEGATIVE   Comment: (NOTE) Fact Sheet for Patients: EntrepreneurPulse.com.au  Fact Sheet for Healthcare Providers: IncredibleEmployment.be  This test is not yet approved or cleared by the [place] FDA and has been authorized for detection and/or diagnosis of SARS-CoV-2 by FDA under an Emergency Use Authorization (EUA). This EUA will remain in effect (meaning this test can be used) for the duration of the COVID-19 declaration under Section 564(b)(1) of the Act, 21 U.S.C. section 360bbb-3(b)(1), unless the authorization is terminated or revoked.  Performed at [facility], [address], [place], [place] [address]  05/08/22 Respiratory Biofire: Component Ref Range & Units 5 d ago  Adenovirus NOT DETECTED NOT DETECTED  Coronavirus 229E NOT DETECTED NOT DETECTED  Comment: (NOTE) The Coronavirus on the Respiratory Panel, DOES NOT test for the novel Coronavirus (2019 nCoV)  Coronavirus HKU1 NOT DETECTED NOT DETECTED  Coronavirus NL63 NOT DETECTED NOT DETECTED  Coronavirus OC43 NOT DETECTED NOT DETECTED  Metapneumovirus NOT DETECTED NOT DETECTED  Rhinovirus / Enterovirus NOT DETECTED DETECTED Abnormal   Influenza A NOT DETECTED NOT DETECTED  Influenza B NOT DETECTED NOT DETECTED  Parainfluenza Virus 1 NOT DETECTED NOT DETECTED  Parainfluenza Virus 2 NOT DETECTED NOT DETECTED  Parainfluenza Virus 3 NOT DETECTED NOT DETECTED  Parainfluenza Virus  4 NOT DETECTED NOT DETECTED  Respiratory Syncytial Virus NOT DETECTED NOT DETECTED  Bordetella pertussis NOT DETECTED NOT DETECTED  Bordetella Parapertussis NOT DETECTED NOT DETECTED  Chlamydophila pneumoniae NOT DETECTED NOT DETECTED  Mycoplasma pneumoniae NOT DETECTED NOT DETECTED  Comment: Performed at [facility] [place] [address], [place],  [address]  Resulting Agency  [facility]         Specimen Collected: 05/08/22 18:31 Last Resulted: 05/08/22 22:43        CTA PULMONARY 05/08/22: COMPARISON:  X-ray 05/08/2022.  CT scan 04/09/2020.   FINDINGS: Cardiovascular: Enlarged heart. The thoracic aorta otherwise normal course and caliber with diffuse vascular calcifications and atherosclerotic change. Coronary artery calcifications are also seen. No significant pericardial effusion. Motion is seen throughout the examination which can limit evaluation of small peripheral emboli. There  is some enlargement of the pulmonary arterial tree. Please correlate for evidence of pulmonary hypertension. No segmental or larger pulmonary embolism identified.   Mediastinum/Nodes: No specific abnormal lymph node enlargement present in the axillary region. There is an enlarged left hilar lymph node on series 4, image 38 measuring 16 x 14 mm which is new from prior CT scan. No significant right hilar lymph node enlargement. Some prominent mediastinal nodes are seen. Pretracheal node on image 29 of series 4 measures 15 by 13 mm. On the prior study this node would have measured 13 by 9 mm. Normal caliber thoracic esophagus which is slightly patulous. Small hiatal hernia. Small thyroid gland.   Lungs/Pleura: Breathing motion seen throughout the examination. There is some dependent areas of nonspecific ground-glass along both lungs diffusely with some interstitial septal thickening. Bronchiectasis noted in the lower lobes with some areas opacity most circum bronchi. Prominent  calcifications diffusely of the tracheobronchial tree. No areas of consolidation at this time.   Upper Abdomen: Along the upper abdomen the adrenal glands are preserved. Gallbladder is present. There is a lobular appearance to the left kidney at the edge of the imaging field on the very last image. This could be technical although lesion is not excluded recommend dedicated workup when appropriate just a contrast CT of the abdomen and pelvis.   Musculoskeletal: Diffuse degenerative changes are seen including the shoulders and spine. Osteopenia. Slight curvature of the spine as well.   Review of the MIP images confirms the above findings.   IMPRESSION: Breathing motion. No segmental or larger pulmonary embolism identified. There is some enlargement of the pulmonary arteries which can be seen with pulmonary hypertension.   Diffuse areas peribronchial thickening and opacity along areas of bronchi particularly in the lower lobes. Peripheral areas of interstitial changes and ground-glass.   Development of some mildly enlarged mediastinal and left hilar lymph nodes of uncertain etiology and significance. Recommend further workup or short follow-up.   Lobular appearance to the left kidney at the edge of the imaging field could represent volume averaging on the chest CT however recommend confirmatory study to exclude mass lesion when clinically appropriate   Aortic Atherosclerosis (ICD10-I70.0) and Emphysema (IOE70-J50.9)    I reviewed available lab, medications, imaging, studies and related documents from the EMR.  Records reviewed and summarized above.   ROS (limited from pt with fatigue) General: NAD, endorses fatigue, denies fever and chills Cardiovascular: denies chest pain, denies DOE Pulmonary: denies increased SOB Abdomen: endorses poor appetite, denies nausea and vomiting GU: Denies dysuria, endorses continence of urine MSK:  endorses increased weakness,  Neurological:  denies pain, insomnia   Physical Exam: Current and past weights: 155 lbs  as of 09/02/21, weight 128 lbs 4.9 oz on 05/09/22 Constitutional: NAD, appears ill and fatigued General: frail appearing, thin EYES: anicteric sclera ENMT: intact hearing CV: S1S2, RRR, No LE edema  Pulmonary: Expiratory wheezes and fine crackles on left, diminished breath sounds right base, no increased work of breathing, non-productive cough, initial O2 sats 86-88% on 2L, increased to 3L and O2 sat increased to 90%. Abdomen: Normo-active BS + 4 quadrants, soft, no tenderness or distension GU: deferred MSK: no sarcopenia Skin: warm and dry Psych: non-anxious affect, Asleep/arousable and O x 2 Hem/lymph/immuno: no widespread bruising   Thank you for the opportunity to participate in the care of [name].  The palliative care team will continue to follow. Please call our office at [phone] if we can be of additional assistance.  Marijo Conception, FNP -C  COVID-19 PATIENT SCREENING TOOL Asked and negative response unless otherwise noted:   Have you had symptoms of covid, tested positive or been in contact with someone with symptoms/positive test in the past 5-10 days?   Unknown, negative Covid test but hospitalized 1/4-05/12/22

## 2022-05-14 ENCOUNTER — Telehealth: Payer: Self-pay | Admitting: Family Medicine

## 2022-05-14 DIAGNOSIS — I4891 Unspecified atrial fibrillation: Secondary | ICD-10-CM | POA: Diagnosis not present

## 2022-05-14 DIAGNOSIS — E559 Vitamin D deficiency, unspecified: Secondary | ICD-10-CM | POA: Diagnosis not present

## 2022-05-14 DIAGNOSIS — E119 Type 2 diabetes mellitus without complications: Secondary | ICD-10-CM | POA: Diagnosis not present

## 2022-05-14 DIAGNOSIS — R5381 Other malaise: Secondary | ICD-10-CM | POA: Diagnosis not present

## 2022-05-14 DIAGNOSIS — G4700 Insomnia, unspecified: Secondary | ICD-10-CM | POA: Diagnosis not present

## 2022-05-14 DIAGNOSIS — N390 Urinary tract infection, site not specified: Secondary | ICD-10-CM | POA: Diagnosis not present

## 2022-05-14 DIAGNOSIS — G8929 Other chronic pain: Secondary | ICD-10-CM | POA: Diagnosis not present

## 2022-05-14 DIAGNOSIS — M1990 Unspecified osteoarthritis, unspecified site: Secondary | ICD-10-CM | POA: Diagnosis not present

## 2022-05-14 DIAGNOSIS — N189 Chronic kidney disease, unspecified: Secondary | ICD-10-CM | POA: Diagnosis not present

## 2022-05-14 DIAGNOSIS — I10 Essential (primary) hypertension: Secondary | ICD-10-CM | POA: Diagnosis not present

## 2022-05-14 DIAGNOSIS — J189 Pneumonia, unspecified organism: Secondary | ICD-10-CM | POA: Diagnosis not present

## 2022-05-14 DIAGNOSIS — K219 Gastro-esophageal reflux disease without esophagitis: Secondary | ICD-10-CM | POA: Diagnosis not present

## 2022-05-14 NOTE — Telephone Encounter (Signed)
TCT pt's son/HC POA [name] after receiving call from [facility] stating that pt's son was unaware of Palliative Care following pt and could not hear most of conversation yesterday due to bad phone connection.  DON indicates that on review of their records for  May 2023 and the last year that weight has only fluctuated up or down 5 lbs and was 135 lbs at that time at the facility. Advised son that weight in May 2023 was from prior visit with [facility] that occurred on 08/10/21 as 155 lbs.  Given that pt's weight is in the same range there has not been a significant weight loss.  Advised that a finding of a patulous esophagous on CT exam did not mean pt was aspirating but was at risk and so ST eval is preventive as sometimes there is silent aspiration and patients show with repetitive infections. Advised son that original referral for Palliative was sent in Jul 01, 2019 by an NP at the hospital and that our administrative team reached out to [facility] for the order for [place] which was given 07/04/19 from the facility.  Advised that usually when patients go out to the hospital, are admitted and come back we get new orders which might be why the orders are not currently visible since pt has had several admissions.   Advised regarding decision making with Hospice includes the Palliative Performance Scale Score and what that consists of with the lower the score the more dependent patients are and more likely to be sleeping more, eating and drinking less which is apparent in weights and may make them more eligible for Hospice.  He states familiarity with Hospice and even recently lost his father-in-law last week who was on service with hospice.  He asks if Palliative Care is just the step before Hospice.  Explained it is for people with chronic conditions expected to decline over time and cause symptoms that impact quality of life, that there is no time limit on Palliative but it does bump  into Hospice as the patient declines over time.  Advised that currently without significant weight loss would advise continuing to follow with Palliative if that was in agreement with his wishes.  He states it is fine to continue Palliative Service so order will be obtained per DON from Irene Limbo, NP who is attending of record.  Damaris Hippo FNP-C

## 2022-05-15 DIAGNOSIS — N390 Urinary tract infection, site not specified: Secondary | ICD-10-CM | POA: Diagnosis not present

## 2022-05-15 DIAGNOSIS — K429 Umbilical hernia without obstruction or gangrene: Secondary | ICD-10-CM | POA: Diagnosis not present

## 2022-05-15 DIAGNOSIS — G4700 Insomnia, unspecified: Secondary | ICD-10-CM | POA: Diagnosis not present

## 2022-05-15 DIAGNOSIS — R5381 Other malaise: Secondary | ICD-10-CM | POA: Diagnosis not present

## 2022-05-15 DIAGNOSIS — J189 Pneumonia, unspecified organism: Secondary | ICD-10-CM | POA: Diagnosis not present

## 2022-05-15 DIAGNOSIS — I10 Essential (primary) hypertension: Secondary | ICD-10-CM | POA: Diagnosis not present

## 2022-05-15 DIAGNOSIS — E119 Type 2 diabetes mellitus without complications: Secondary | ICD-10-CM | POA: Diagnosis not present

## 2022-05-15 DIAGNOSIS — N189 Chronic kidney disease, unspecified: Secondary | ICD-10-CM | POA: Diagnosis not present

## 2022-05-15 DIAGNOSIS — E1142 Type 2 diabetes mellitus with diabetic polyneuropathy: Secondary | ICD-10-CM | POA: Diagnosis not present

## 2022-05-15 DIAGNOSIS — I739 Peripheral vascular disease, unspecified: Secondary | ICD-10-CM | POA: Diagnosis not present

## 2022-05-19 DIAGNOSIS — E119 Type 2 diabetes mellitus without complications: Secondary | ICD-10-CM | POA: Diagnosis not present

## 2022-05-21 ENCOUNTER — Ambulatory Visit: Admit: 2022-05-21 | Payer: PPO | Admitting: Surgery

## 2022-05-21 DIAGNOSIS — G8929 Other chronic pain: Secondary | ICD-10-CM | POA: Diagnosis not present

## 2022-05-21 DIAGNOSIS — F329 Major depressive disorder, single episode, unspecified: Secondary | ICD-10-CM | POA: Diagnosis not present

## 2022-05-21 DIAGNOSIS — G4720 Circadian rhythm sleep disorder, unspecified type: Secondary | ICD-10-CM | POA: Diagnosis not present

## 2022-05-21 SURGERY — REPAIR, HERNIA, UMBILICAL, ADULT
Anesthesia: General

## 2022-05-27 DIAGNOSIS — I10 Essential (primary) hypertension: Secondary | ICD-10-CM | POA: Diagnosis not present

## 2022-06-04 DIAGNOSIS — I10 Essential (primary) hypertension: Secondary | ICD-10-CM | POA: Diagnosis not present

## 2022-06-04 DIAGNOSIS — E559 Vitamin D deficiency, unspecified: Secondary | ICD-10-CM | POA: Diagnosis not present

## 2022-06-04 DIAGNOSIS — I639 Cerebral infarction, unspecified: Secondary | ICD-10-CM | POA: Diagnosis not present

## 2022-06-04 DIAGNOSIS — F339 Major depressive disorder, recurrent, unspecified: Secondary | ICD-10-CM | POA: Diagnosis not present

## 2022-06-04 DIAGNOSIS — I4891 Unspecified atrial fibrillation: Secondary | ICD-10-CM | POA: Diagnosis not present

## 2022-06-04 DIAGNOSIS — E1142 Type 2 diabetes mellitus with diabetic polyneuropathy: Secondary | ICD-10-CM | POA: Diagnosis not present

## 2022-06-04 DIAGNOSIS — M1990 Unspecified osteoarthritis, unspecified site: Secondary | ICD-10-CM | POA: Diagnosis not present

## 2022-06-04 DIAGNOSIS — N189 Chronic kidney disease, unspecified: Secondary | ICD-10-CM | POA: Diagnosis not present

## 2022-06-05 DIAGNOSIS — R41841 Cognitive communication deficit: Secondary | ICD-10-CM | POA: Diagnosis not present

## 2022-06-05 DIAGNOSIS — M6281 Muscle weakness (generalized): Secondary | ICD-10-CM | POA: Diagnosis not present

## 2022-06-05 DIAGNOSIS — Z7409 Other reduced mobility: Secondary | ICD-10-CM | POA: Diagnosis not present

## 2022-06-11 DIAGNOSIS — E559 Vitamin D deficiency, unspecified: Secondary | ICD-10-CM | POA: Diagnosis not present

## 2022-06-11 DIAGNOSIS — G4700 Insomnia, unspecified: Secondary | ICD-10-CM | POA: Diagnosis not present

## 2022-06-11 DIAGNOSIS — K5900 Constipation, unspecified: Secondary | ICD-10-CM | POA: Diagnosis not present

## 2022-06-11 DIAGNOSIS — M1990 Unspecified osteoarthritis, unspecified site: Secondary | ICD-10-CM | POA: Diagnosis not present

## 2022-06-11 DIAGNOSIS — E119 Type 2 diabetes mellitus without complications: Secondary | ICD-10-CM | POA: Diagnosis not present

## 2022-06-11 DIAGNOSIS — N189 Chronic kidney disease, unspecified: Secondary | ICD-10-CM | POA: Diagnosis not present

## 2022-06-11 DIAGNOSIS — R5381 Other malaise: Secondary | ICD-10-CM | POA: Diagnosis not present

## 2022-06-11 DIAGNOSIS — K219 Gastro-esophageal reflux disease without esophagitis: Secondary | ICD-10-CM | POA: Diagnosis not present

## 2022-06-11 DIAGNOSIS — N3281 Overactive bladder: Secondary | ICD-10-CM | POA: Diagnosis not present

## 2022-06-12 DIAGNOSIS — K219 Gastro-esophageal reflux disease without esophagitis: Secondary | ICD-10-CM | POA: Diagnosis not present

## 2022-06-12 DIAGNOSIS — E119 Type 2 diabetes mellitus without complications: Secondary | ICD-10-CM | POA: Diagnosis not present

## 2022-06-12 DIAGNOSIS — E559 Vitamin D deficiency, unspecified: Secondary | ICD-10-CM | POA: Diagnosis not present

## 2022-06-12 DIAGNOSIS — E1142 Type 2 diabetes mellitus with diabetic polyneuropathy: Secondary | ICD-10-CM | POA: Diagnosis not present

## 2022-06-12 DIAGNOSIS — G4700 Insomnia, unspecified: Secondary | ICD-10-CM | POA: Diagnosis not present

## 2022-06-12 DIAGNOSIS — D508 Other iron deficiency anemias: Secondary | ICD-10-CM | POA: Diagnosis not present

## 2022-06-12 DIAGNOSIS — N3281 Overactive bladder: Secondary | ICD-10-CM | POA: Diagnosis not present

## 2022-06-13 DIAGNOSIS — N390 Urinary tract infection, site not specified: Secondary | ICD-10-CM | POA: Diagnosis not present

## 2022-06-13 DIAGNOSIS — R946 Abnormal results of thyroid function studies: Secondary | ICD-10-CM | POA: Diagnosis not present

## 2022-06-16 DIAGNOSIS — K219 Gastro-esophageal reflux disease without esophagitis: Secondary | ICD-10-CM | POA: Diagnosis not present

## 2022-06-16 DIAGNOSIS — N189 Chronic kidney disease, unspecified: Secondary | ICD-10-CM | POA: Diagnosis not present

## 2022-06-16 DIAGNOSIS — M1990 Unspecified osteoarthritis, unspecified site: Secondary | ICD-10-CM | POA: Diagnosis not present

## 2022-06-16 DIAGNOSIS — E119 Type 2 diabetes mellitus without complications: Secondary | ICD-10-CM | POA: Diagnosis not present

## 2022-06-16 DIAGNOSIS — G4700 Insomnia, unspecified: Secondary | ICD-10-CM | POA: Diagnosis not present

## 2022-06-16 DIAGNOSIS — I4891 Unspecified atrial fibrillation: Secondary | ICD-10-CM | POA: Diagnosis not present

## 2022-06-16 DIAGNOSIS — E559 Vitamin D deficiency, unspecified: Secondary | ICD-10-CM | POA: Diagnosis not present

## 2022-06-16 DIAGNOSIS — R059 Cough, unspecified: Secondary | ICD-10-CM | POA: Diagnosis not present

## 2022-06-16 DIAGNOSIS — N3281 Overactive bladder: Secondary | ICD-10-CM | POA: Diagnosis not present

## 2022-06-16 DIAGNOSIS — I10 Essential (primary) hypertension: Secondary | ICD-10-CM | POA: Diagnosis not present

## 2022-06-16 DIAGNOSIS — I739 Peripheral vascular disease, unspecified: Secondary | ICD-10-CM | POA: Diagnosis not present

## 2022-06-16 DIAGNOSIS — N390 Urinary tract infection, site not specified: Secondary | ICD-10-CM | POA: Diagnosis not present

## 2022-06-17 DIAGNOSIS — R051 Acute cough: Secondary | ICD-10-CM | POA: Diagnosis not present

## 2022-06-17 DIAGNOSIS — R059 Cough, unspecified: Secondary | ICD-10-CM | POA: Diagnosis not present

## 2022-06-18 DIAGNOSIS — N189 Chronic kidney disease, unspecified: Secondary | ICD-10-CM | POA: Diagnosis not present

## 2022-06-18 DIAGNOSIS — N3281 Overactive bladder: Secondary | ICD-10-CM | POA: Diagnosis not present

## 2022-06-18 DIAGNOSIS — R5381 Other malaise: Secondary | ICD-10-CM | POA: Diagnosis not present

## 2022-06-18 DIAGNOSIS — E119 Type 2 diabetes mellitus without complications: Secondary | ICD-10-CM | POA: Diagnosis not present

## 2022-06-18 DIAGNOSIS — I739 Peripheral vascular disease, unspecified: Secondary | ICD-10-CM | POA: Diagnosis not present

## 2022-06-18 DIAGNOSIS — K219 Gastro-esophageal reflux disease without esophagitis: Secondary | ICD-10-CM | POA: Diagnosis not present

## 2022-06-18 DIAGNOSIS — G4700 Insomnia, unspecified: Secondary | ICD-10-CM | POA: Diagnosis not present

## 2022-06-18 DIAGNOSIS — J189 Pneumonia, unspecified organism: Secondary | ICD-10-CM | POA: Diagnosis not present

## 2022-06-18 DIAGNOSIS — E559 Vitamin D deficiency, unspecified: Secondary | ICD-10-CM | POA: Diagnosis not present

## 2022-06-18 DIAGNOSIS — I10 Essential (primary) hypertension: Secondary | ICD-10-CM | POA: Diagnosis not present

## 2022-06-18 DIAGNOSIS — N390 Urinary tract infection, site not specified: Secondary | ICD-10-CM | POA: Diagnosis not present

## 2022-06-18 DIAGNOSIS — I4891 Unspecified atrial fibrillation: Secondary | ICD-10-CM | POA: Diagnosis not present

## 2022-06-20 DIAGNOSIS — G8929 Other chronic pain: Secondary | ICD-10-CM | POA: Diagnosis not present

## 2022-06-20 DIAGNOSIS — F329 Major depressive disorder, single episode, unspecified: Secondary | ICD-10-CM | POA: Diagnosis not present

## 2022-06-20 DIAGNOSIS — G4720 Circadian rhythm sleep disorder, unspecified type: Secondary | ICD-10-CM | POA: Diagnosis not present

## 2022-06-27 DIAGNOSIS — I10 Essential (primary) hypertension: Secondary | ICD-10-CM | POA: Diagnosis not present

## 2022-06-27 DIAGNOSIS — I639 Cerebral infarction, unspecified: Secondary | ICD-10-CM | POA: Diagnosis not present

## 2022-06-27 DIAGNOSIS — E119 Type 2 diabetes mellitus without complications: Secondary | ICD-10-CM | POA: Diagnosis not present

## 2022-06-27 DIAGNOSIS — F339 Major depressive disorder, recurrent, unspecified: Secondary | ICD-10-CM | POA: Diagnosis not present

## 2022-06-27 DIAGNOSIS — E559 Vitamin D deficiency, unspecified: Secondary | ICD-10-CM | POA: Diagnosis not present

## 2022-06-27 DIAGNOSIS — I4891 Unspecified atrial fibrillation: Secondary | ICD-10-CM | POA: Diagnosis not present

## 2022-06-27 DIAGNOSIS — N189 Chronic kidney disease, unspecified: Secondary | ICD-10-CM | POA: Diagnosis not present

## 2022-06-27 DIAGNOSIS — M1990 Unspecified osteoarthritis, unspecified site: Secondary | ICD-10-CM | POA: Diagnosis not present

## 2022-07-01 DIAGNOSIS — B351 Tinea unguium: Secondary | ICD-10-CM | POA: Diagnosis not present

## 2022-07-01 DIAGNOSIS — E1159 Type 2 diabetes mellitus with other circulatory complications: Secondary | ICD-10-CM | POA: Diagnosis not present

## 2022-07-02 DIAGNOSIS — D508 Other iron deficiency anemias: Secondary | ICD-10-CM | POA: Diagnosis not present

## 2022-07-02 DIAGNOSIS — K219 Gastro-esophageal reflux disease without esophagitis: Secondary | ICD-10-CM | POA: Diagnosis not present

## 2022-07-02 DIAGNOSIS — H6123 Impacted cerumen, bilateral: Secondary | ICD-10-CM | POA: Diagnosis not present

## 2022-07-02 DIAGNOSIS — F329 Major depressive disorder, single episode, unspecified: Secondary | ICD-10-CM | POA: Diagnosis not present

## 2022-07-02 DIAGNOSIS — B338 Other specified viral diseases: Secondary | ICD-10-CM | POA: Diagnosis not present

## 2022-07-02 DIAGNOSIS — E119 Type 2 diabetes mellitus without complications: Secondary | ICD-10-CM | POA: Diagnosis not present

## 2022-07-02 DIAGNOSIS — J189 Pneumonia, unspecified organism: Secondary | ICD-10-CM | POA: Diagnosis not present

## 2022-07-02 DIAGNOSIS — E559 Vitamin D deficiency, unspecified: Secondary | ICD-10-CM | POA: Diagnosis not present

## 2022-07-04 DIAGNOSIS — R41841 Cognitive communication deficit: Secondary | ICD-10-CM | POA: Diagnosis not present

## 2022-07-04 DIAGNOSIS — Z7409 Other reduced mobility: Secondary | ICD-10-CM | POA: Diagnosis not present

## 2022-07-04 DIAGNOSIS — M6281 Muscle weakness (generalized): Secondary | ICD-10-CM | POA: Diagnosis not present

## 2022-07-10 DIAGNOSIS — E559 Vitamin D deficiency, unspecified: Secondary | ICD-10-CM | POA: Diagnosis not present

## 2022-07-10 DIAGNOSIS — K5900 Constipation, unspecified: Secondary | ICD-10-CM | POA: Diagnosis not present

## 2022-07-10 DIAGNOSIS — G4700 Insomnia, unspecified: Secondary | ICD-10-CM | POA: Diagnosis not present

## 2022-07-10 DIAGNOSIS — E119 Type 2 diabetes mellitus without complications: Secondary | ICD-10-CM | POA: Diagnosis not present

## 2022-07-10 DIAGNOSIS — R5381 Other malaise: Secondary | ICD-10-CM | POA: Diagnosis not present

## 2022-07-10 DIAGNOSIS — M1990 Unspecified osteoarthritis, unspecified site: Secondary | ICD-10-CM | POA: Diagnosis not present

## 2022-07-10 DIAGNOSIS — K219 Gastro-esophageal reflux disease without esophagitis: Secondary | ICD-10-CM | POA: Diagnosis not present

## 2022-07-10 DIAGNOSIS — N189 Chronic kidney disease, unspecified: Secondary | ICD-10-CM | POA: Diagnosis not present

## 2022-07-10 DIAGNOSIS — B338 Other specified viral diseases: Secondary | ICD-10-CM | POA: Diagnosis not present

## 2022-07-10 DIAGNOSIS — I10 Essential (primary) hypertension: Secondary | ICD-10-CM | POA: Diagnosis not present

## 2022-07-10 DIAGNOSIS — N3281 Overactive bladder: Secondary | ICD-10-CM | POA: Diagnosis not present

## 2022-07-14 DIAGNOSIS — N3941 Urge incontinence: Secondary | ICD-10-CM | POA: Diagnosis not present

## 2022-07-14 DIAGNOSIS — N281 Cyst of kidney, acquired: Secondary | ICD-10-CM | POA: Diagnosis not present

## 2022-07-14 DIAGNOSIS — N3020 Other chronic cystitis without hematuria: Secondary | ICD-10-CM | POA: Diagnosis not present

## 2022-07-14 DIAGNOSIS — R3914 Feeling of incomplete bladder emptying: Secondary | ICD-10-CM | POA: Diagnosis not present

## 2022-07-17 DIAGNOSIS — F329 Major depressive disorder, single episode, unspecified: Secondary | ICD-10-CM | POA: Diagnosis not present

## 2022-07-17 DIAGNOSIS — G8929 Other chronic pain: Secondary | ICD-10-CM | POA: Diagnosis not present

## 2022-07-17 DIAGNOSIS — G4720 Circadian rhythm sleep disorder, unspecified type: Secondary | ICD-10-CM | POA: Diagnosis not present

## 2022-07-23 DIAGNOSIS — E559 Vitamin D deficiency, unspecified: Secondary | ICD-10-CM | POA: Diagnosis not present

## 2022-07-23 DIAGNOSIS — I10 Essential (primary) hypertension: Secondary | ICD-10-CM | POA: Diagnosis not present

## 2022-07-23 DIAGNOSIS — I739 Peripheral vascular disease, unspecified: Secondary | ICD-10-CM | POA: Diagnosis not present

## 2022-07-23 DIAGNOSIS — K219 Gastro-esophageal reflux disease without esophagitis: Secondary | ICD-10-CM | POA: Diagnosis not present

## 2022-07-23 DIAGNOSIS — N3281 Overactive bladder: Secondary | ICD-10-CM | POA: Diagnosis not present

## 2022-07-23 DIAGNOSIS — N189 Chronic kidney disease, unspecified: Secondary | ICD-10-CM | POA: Diagnosis not present

## 2022-07-23 DIAGNOSIS — K429 Umbilical hernia without obstruction or gangrene: Secondary | ICD-10-CM | POA: Diagnosis not present

## 2022-07-23 DIAGNOSIS — G8929 Other chronic pain: Secondary | ICD-10-CM | POA: Diagnosis not present

## 2022-07-23 DIAGNOSIS — R5381 Other malaise: Secondary | ICD-10-CM | POA: Diagnosis not present

## 2022-07-23 DIAGNOSIS — I4891 Unspecified atrial fibrillation: Secondary | ICD-10-CM | POA: Diagnosis not present

## 2022-07-23 DIAGNOSIS — E119 Type 2 diabetes mellitus without complications: Secondary | ICD-10-CM | POA: Diagnosis not present

## 2022-07-23 DIAGNOSIS — G4700 Insomnia, unspecified: Secondary | ICD-10-CM | POA: Diagnosis not present

## 2022-07-29 DIAGNOSIS — E119 Type 2 diabetes mellitus without complications: Secondary | ICD-10-CM | POA: Diagnosis not present

## 2022-07-29 DIAGNOSIS — I4891 Unspecified atrial fibrillation: Secondary | ICD-10-CM | POA: Diagnosis not present

## 2022-07-29 DIAGNOSIS — I10 Essential (primary) hypertension: Secondary | ICD-10-CM | POA: Diagnosis not present

## 2022-07-29 DIAGNOSIS — N189 Chronic kidney disease, unspecified: Secondary | ICD-10-CM | POA: Diagnosis not present

## 2022-07-29 DIAGNOSIS — E1142 Type 2 diabetes mellitus with diabetic polyneuropathy: Secondary | ICD-10-CM | POA: Diagnosis not present

## 2022-07-29 DIAGNOSIS — I639 Cerebral infarction, unspecified: Secondary | ICD-10-CM | POA: Diagnosis not present

## 2022-07-29 DIAGNOSIS — E559 Vitamin D deficiency, unspecified: Secondary | ICD-10-CM | POA: Diagnosis not present

## 2022-07-29 DIAGNOSIS — M797 Fibromyalgia: Secondary | ICD-10-CM | POA: Diagnosis not present

## 2022-07-30 NOTE — Progress Notes (Signed)
Cardiology Clinic Note   Patient Name: [name] Date of Encounter: 08/01/2022  Primary Care Provider:  Jodi Marble, MD Primary Cardiologist:  Peter Martinique, MD  Patient Profile    89 y.o. y/o female with a h/o hypertension, HL, Type II diabetes, atrial fibrillation (not currently on anticoagulation), CVA, chronic diastolic CHF and pulmonary fibrosis.  She is currently under Palliative Care with plans per [facility] do to decline in function, weight, eating.    Past Medical History    Past Medical History:  Diagnosis Date   Arthritis    Atrial fibrillation ([place])    Cancer ([place])    breast   Cataract    Diabetes mellitus without complication ([name])    Frequent UTI    Hyperlipidemia    Hypertension    Neck pain    Pneumonia    Scoliosis    Stroke [facility])    Past Surgical History:  Procedure Laterality Date   APPENDECTOMY     BREAST LUMPECTOMY Left    CHOLECYSTECTOMY     JOINT REPLACEMENT     bilat knee    REPLACEMENT TOTAL KNEE BILATERAL Bilateral    TONSILLECTOMY      Allergies  Allergies  Allergen Reactions   Diltiazem Hcl Itching, Rash and Other (See Comments)    Red itchy rash started a few hours after taking short acting 120mg  dilt tabs    Duloxetine Hcl Anxiety, Rash and Other (See Comments)    Makes patient feel "restless and scared" and unable to sleep   Ace Inhibitors Cough    History of Present Illness    [name] is a very pleasant 89 year old female who comes today for annual follow-up.  She is a resident of [facility] [place] in [facility] and is accompanied by nurses aide.  She denies any chest pain, dyspnea on exertion, PND, orthopnea, or excessive lower extremity edema.  She is very sedentary she has a history of a minimum 21 falls, and this accompanied to the bathroom and other areas within the facility by a skilled worker.  Medications were provided by the facility.  She she states her energy level is low, and so is  her appetite.  I did have an opportunity to review all of her weights and they have been stable between 132 and 132 pounds since January 2024.  Labs are also drawn at the skilled nursing facility, with most recent in February 2024.  These labs were within normal limits.  Home Medications    Current Outpatient Medications  Medication Sig Dispense Refill   acetaminophen (TYLENOL) 325 MG tablet Take 650 mg by mouth every 6 (six) hours as needed (pain).     Apoaequorin (PREVAGEN EXTRA STRENGTH) 20 MG CAPS Take 20 mg by mouth daily.     atorvastatin (LIPITOR) 40 MG tablet Take 1 tablet (40 mg total) by mouth daily. 90 tablet 3   celecoxib (CELEBREX) 100 MG capsule Take 100 mg by mouth 2 (two) times daily.     Cholecalciferol (VITAMIN D3) 50 MCG (2000 UT) TABS Take 2,000 Units by mouth in the morning.     cloNIDine (CATAPRES) 0.1 MG tablet Take 0.1 mg by mouth 2 (two) times daily.     D-Mannose 500 MG CAPS Take 2,000 mg by mouth daily.     docusate sodium (COLACE) 100 MG capsule Take 100 mg by mouth daily.     estradiol (ESTRACE) 0.1 MG/GM vaginal cream Place 1 Applicatorful vaginally See admin instructions. Apply  0.5g at bedtime on Mondays and Thursdays only.     fluticasone (FLONASE) 50 MCG/ACT nasal spray Place 1 spray into both nostrils daily.     hydrALAZINE (APRESOLINE) 25 MG tablet Take 1 tablet (25 mg total) by mouth 3 (three) times daily. 270 tablet 3   hydrochlorothiazide (HYDRODIURIL) 25 MG tablet Take 50 mg by mouth daily.     HYDROcodone-acetaminophen (NORCO/VICODIN) 5-325 MG tablet Take 1 tablet by mouth See admin instructions. Take 1 tablet every morning. Take 1 tablet every 8 hours as needed for pain.     ipratropium-albuterol (DUONEB) 0.5-2.5 (3) MG/3ML SOLN Take 3 mLs by nebulization every 6 (six) hours as needed (wheezing/SOB).     LEVEMIR FLEXTOUCH 100 UNIT/ML FlexTouch Pen Inject 6-9 Units into the skin See admin instructions. Inject 9 units in the morning and 6 units at bedtime.      Magnesium 250 MG TABS Take 250 mg by mouth daily.     metFORMIN (GLUCOPHAGE) 500 MG tablet Take 1 tablet (500 mg total) by mouth daily with breakfast. (Patient taking differently: Take 500 mg by mouth daily with breakfast.) 90 tablet 0   mirtazapine (REMERON) 30 MG tablet Take 1 tablet by mouth at bedtime.     NOVOLOG FLEXPEN 100 UNIT/ML FlexPen Inject 0-10 Units into the skin See admin instructions. Sliding scale: CBG 1 to 150=0 units, 151-200=2 units, 201-250=4 units, 251-300=6 units, 301-350=8 units, 351-400=10 units >400 call Dr < 70 call Dr BID     ondansetron (ZOFRAN) 4 MG tablet Take 1 tablet (4 mg total) by mouth every 8 (eight) hours as needed for nausea or vomiting. 90 tablet 0   polyethylene glycol (MIRALAX / GLYCOLAX) 17 g packet Take 17 g by mouth daily as needed (constipation).     potassium chloride (KLOR-CON M) 10 MEQ tablet Take 40 mEq by mouth 2 (two) times daily.     rOPINIRole (REQUIP) 0.25 MG tablet Take 0.25 mg by mouth 3 (three) times daily.     sertraline (ZOLOFT) 100 MG tablet Take 100 mg by mouth daily.     No current facility-administered medications for this visit.     Family History    Family History  Problem Relation Age of Onset   Cancer Mother        lung   Arthritis Mother    Heart disease Father        endocarditis   Cancer Brother        lung, throat   Alcohol abuse Brother    She indicated that her mother is deceased. She indicated that her father is deceased. She indicated that her brother is deceased. She indicated that her maternal grandmother is deceased. She indicated that her maternal grandfather is deceased. She indicated that her paternal grandmother is deceased. She indicated that her paternal grandfather is deceased. She indicated that both of her sons are alive.  Social History    Social History   Socioeconomic History   Marital status: Widowed    Spouse name: Not on file   Number of children: 2   Years of education: 93   Highest  education level: Some college, no degree  Occupational History   Occupation: Retired    Fish farm manager: [employer]    CommentChiropodist  Tobacco Use   Smoking status: Never   Smokeless tobacco: Never  Vaping Use   Vaping Use: Never used  Substance and Sexual Activity   Alcohol use: No    Alcohol/week: 0.0 standard drinks of  alcohol   Drug use: No   Sexual activity: Not Currently  Other Topics Concern   Not on file  Social History Narrative   Lives alone in a one story home.  Husband passed away. She has two sons 2 sons but.  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  Education: specialty courses with [employer], high school education.   Social Determinants of Health   Financial Resource Strain: Low Risk  (07/16/2018)   Overall Financial Resource Strain (CARDIA)    Difficulty of Paying Living Expenses: Not very hard  Food Insecurity: No Food Insecurity (05/09/2022)   Hunger Vital Sign    Worried About Running Out of Food in the Last Year: Never true    Ran Out of Food in the Last Year: Never true  Transportation Needs: No Transportation Needs (05/09/2022)   PRAPARE - Hydrologist (Medical): No    Lack of Transportation (Non-Medical): No  Physical Activity: Inactive (04/12/2021)   Exercise Vital Sign    Days of Exercise per Week: 0 days    Minutes of Exercise per Session: 0 min  Stress: Stress Concern Present (04/12/2021)   [place]    Feeling of Stress : To some extent  Social Connections: Moderately Isolated (08/17/2018)   Social Connection and Isolation Panel [NHANES]    Frequency of Communication with Friends and Family: Three times a week    Frequency of Social Gatherings with Friends and Family: Never    Attends Religious Services: Never    Marine scientist or Organizations: No    Attends Archivist Meetings: Never    Marital Status: Widowed  Intimate Partner Violence:  Not At Risk (05/09/2022)   Humiliation, Afraid, Rape, and Kick questionnaire    Fear of Current or Ex-Partner: No    Emotionally Abused: No    Physically Abused: No    Sexually Abused: No     Review of Systems    General:  No chills, fever, night sweats or weight changes.  Cardiovascular:  No chest pain, dyspnea on exertion, edema, orthopnea, palpitations, paroxysmal nocturnal dyspnea. Dermatological: No rash, lesions/masses Respiratory: No cough, dyspnea Urologic: No hematuria, dysuria Abdominal:   No nausea, vomiting, diarrhea, bright red blood per rectum, melena, or hematemesis Neurologic:  No visual changes, wkns, changes in mental status. All other systems reviewed and are otherwise negative except as noted above.     Physical Exam    VS:  BP 122/74 (BP Location: Right Arm, Patient Position: Sitting, Cuff Size: Normal)   Pulse (!) 50   Ht 5' (1.524 m)   Wt 133 lb 9.6 oz (60.6 kg)   SpO2 96%   BMI 26.09 kg/m  , BMI Body mass index is 26.09 kg/m.     GEN: Well nourished, well developed, in no acute distress.  Sitting in wheelchair HEENT: normal. Neck: Supple, no JVD, soft bilateral carotid bruits, or masses. Cardiac: RRR, systolic 2/6 follow murmurs, rubs, or gallops. No clubbing, cyanosis, mild 1+ ankle dependent edema.  Radials/DP/PT 2+ and equal bilaterally.  Respiratory:  Respirations regular and unlabored, clear to auscultation bilaterally.  Mild crackles in the bases GI: Soft, nontender, nondistended, BS + x 4. MS: no deformity or atrophy. Skin: warm and dry, no rash. Neuro:  Strength and sensation are intact. Psych: Normal affect.  Accessory Clinical Findings    ECG personally reviewed by me today-not completed this office visit- Lab Results  Component  Value Date   WBC 5.9 05/12/2022   HGB 10.1 (L) 05/12/2022   HCT 34.0 (L) 05/12/2022   MCV 80.4 05/12/2022   PLT 164 05/12/2022   Lab Results  Component Value Date   CREATININE 0.74 05/12/2022   BUN 18  05/12/2022   NA 138 05/12/2022   K 3.5 05/12/2022   CL 104 05/12/2022   CO2 26 05/12/2022   Lab Results  Component Value Date   ALT 24 07/13/2019   AST 26 07/13/2019   ALKPHOS 48 07/13/2019   BILITOT 0.7 07/13/2019   Lab Results  Component Value Date   CHOL 115 02/24/2020   HDL 46 02/24/2020   LDLCALC 54 02/24/2020   TRIG 71 02/24/2020   CHOLHDL 2.5 02/24/2020    Lab Results  Component Value Date   HGBA1C 6.3 (H) 05/08/2022    Review of Prior Studies: Echocardiogram 06/27/2019 1. Left ventricular ejection fraction, by estimation, is 60 to 65%. The  left ventricle has normal function. The left ventricle has no regional  wall motion abnormalities.LV diastolic filling could not be assessed due  to underlying atrial fibrillation.   2. Right ventricular systolic function is normal. The right ventricular  size is normal.   3. The mitral valve is degenerative. No evidence of mitral valve  regurgitation. No evidence of mitral stenosis.   4. The aortic valve is tricuspid. Aortic valve regurgitation is not  visualized. Mild to moderate aortic valve sclerosis/calcification is  present, without any evidence of aortic stenosis.   5. The inferior vena cava is normal in size with greater than 50%  respiratory variability, suggesting right atrial pressure of 3 mmHg.   6. Left atrial size was mild to moderately dilated.   FINDINGS   Left Ventricle: Left ventricular ejection fraction, by estimation, is 60  to 65%. The left ventricle has normal function. The left ventricle has no  regional wall motion abnormalities. The left ventricular internal cavity  size was normal in size. There is   no left ventricular hypertrophy. LC diastolic filling could not be  assessed due to underlying atrial fibrillation. Elevated left ventricular  end-diastolic pressure.   Assessment & Plan   1.  Chronic diastolic CHF: No evidence of volume overload on exam.  She is on HCTZ 25 mg daily.  She states  that she urinates well and has not had any complaints of significant edema.  [place] heart association class I functional class.  No changes in her regimen at this time.  2.  Permanent atrial fibrillation: She is not on any anticoagulation therapy due to her age and frequent falls and history of subdural hematoma with subarachnoid hemorrhage.  Heart rate is well-controlled.  She is not very active just due to generalized fatigue.  She is not on AV nodal blocking agents at this time.  3.  Hypertension: Remains on clonidine 0.1 mg twice daily along with hydralazine 25 mg 3 times daily, with diuretic HCTZ 50 mg daily.  Blood pressure has been well-controlled on review of SNF blood pressure readings which accompanying her and her packet today.  Usually running in the [id number] to [id number] systolic.  No changes in her medication regimen at this time.  Labs are followed by skilled nursing facility      4.  Hypercholesterolemia: On high-dose atorvastatin 80 mg daily.  Will decrease to 40 mg daily due to her age.  She does have a history of CVA and hyperlipidemia.  5.  Type 2 diabetes: Followed by  PCP at skilled nursing facility.  Defer follow-up labs to that provider.  Signed, Phill Myron. West Pugh, ANP, [place]   08/01/2022 2:21 PM      Office [phone] Fax [phone]  Notice: This dictation was prepared with Dragon dictation along with smaller phrase technology. Any transcriptional errors that result from this process are unintentional and may not be corrected upon review.

## 2022-07-31 ENCOUNTER — Non-Acute Institutional Stay: Payer: PPO | Admitting: Family Medicine

## 2022-07-31 VITALS — BP 184/60 | HR 74 | Temp 98.2°F | Resp 16

## 2022-07-31 DIAGNOSIS — I152 Hypertension secondary to endocrine disorders: Secondary | ICD-10-CM | POA: Diagnosis not present

## 2022-07-31 DIAGNOSIS — K429 Umbilical hernia without obstruction or gangrene: Secondary | ICD-10-CM | POA: Diagnosis not present

## 2022-07-31 DIAGNOSIS — E1159 Type 2 diabetes mellitus with other circulatory complications: Secondary | ICD-10-CM | POA: Diagnosis not present

## 2022-07-31 DIAGNOSIS — I5032 Chronic diastolic (congestive) heart failure: Secondary | ICD-10-CM

## 2022-07-31 DIAGNOSIS — Z9189 Other specified personal risk factors, not elsewhere classified: Secondary | ICD-10-CM

## 2022-07-31 NOTE — Progress Notes (Signed)
[facility] Consult Note Telephone: [phone]  Fax: [phone]   Date of encounter: 07/31/22 11:33 AM PATIENT NAME: [name] [facility] Room [address] [place] [address]   [phone] (home)  DOB: Sep 27, 1925 MRN: [id number] PRIMARY CARE PROVIDER:    Jodi Marble, MD,  [place] [address] [phone]  REFERRING PROVIDER:   Jodi Marble, MD [address] [place],  [place] [address] [phone]  Health Care Agent/Health Care Power of Attorney:    Contact Information     Name Relation Home Work Mobile   [name] [phone]  [phone]   Joan, [name]   [phone]        I met face to face with patient in [place]. Palliative Care was asked to follow this patient by consultation request of Jodi Marble, MD to address advance care planning and complex medical decision making. This is a follow up visit.    CODE STATUS: MOST as of 12/11/2019: DNR/DNI Limited intervention IV fluid and antibiotics if needed No feeding tube    ASSESSMENT AND / RECOMMENDATIONS:  PPS: 50% Hypertension associated with diabetes Not at goal but had not had am meds. Notified nursing of elevated BP and encourage recheck in 1-2 hours after meds given. Continue current meds. Had cough with ACE-I, may be able to try ARB versus increase in dose of Hydralazine if needed.  Umbilical hernia without gangrene or obstruction Educated if develops pain, erythema, becomes firm (non-reducible) or has sharp abdominal pain, nausea or vomiting that she may have bowel entrapment/obstruction. Encouraged to avoid straining.  Chronic diastolic CHF Stable, appears euvolemic presently. Continues on HCTZ and Hydralazine/Clonidine. Intolerant of ACE-I due to cough.   At risk for aspiration due to patulous esophagus Educated on cough/choking after eating  and drinking as symptoms of possible aspiration. Upright for all intake, small bites alternate with sips of fluids and remain upright for 45-60 minutes after meals.   Follow up Palliative Care Visit:  Palliative Care continuing to follow up by monitoring for changes in appetite, weight, functional and cognitive status for chronic disease progression and management in agreement with patient's stated goals of care. Next visit in 4 weeks or prn.  This visit was coded based on medical decision making (MDM).  Chief Complaint  Palliative Care is continuing to follow pt for chronic medical management in setting of chronic diastolic heart failure, persistent atrial fibrillation without anticoagulation and hx of stroke.  HISTORY OF PRESENT ILLNESS: [name] is a 89 y.o. year old female with chronic diastolic heart failure, hx of CVA and atrial fibrillation with no anticoagulation due to age/renal function and fall risk.  Fasting BS she states are 80-90s.  Information in facility EMR shows range of 90-213 with only 3 sugars being above 180 this month, the majority are less than 150. Denies pain, CP, SOB, nausea/vomiting, constipation.  Occasionally has some burning on end urination, states staying well hydrated. Denies difficulty swallowing pills.  Occasionally has coughing and has had 2 episodes of pneumonia in the last year.  Denies coughing specifically with eating and drinking.  States BP normally does not run high but has not had am meds yet and as provider was with pt, nursing was bringing in medications. Denies recent falls and is not currently on Oxygen.  She has some difficulty at times with her shoes but can do most bathing and dressing, can transfer from  chair to bed and back.  Reports short term memory loss and did not recognize provider. Pt states she is scheduled towards the end of April [id number] to have an umbilical hernia repaired which has been growing.  She denies pain/constipation, states it is  soft and reducible. Denies blood in stools, fever, nausea and vomiting.   ACTIVITIES OF DAILY LIVING: CONTINENT OF BLADDER/BOWEL? Yes BATHING/DRESSING/FEEDING-Independent with feeding, requires some assistance with bathing and dressing  MOBILITY:   AMBULATORY WITH ASSISTIVE DEVICE: WHEELCHAIR-can transfer to and from wc  APPETITE? Fair, "lousy food" Likes fruit WEIGHT as of 07/09/22 was 133.6 lbs, on 05/14/22 was 136.4 lbs  CURRENT PROBLEM LIST:  Patient Active Problem List   Diagnosis Date Noted   Acute bronchitis due to Rhinovirus 05/13/2022   Hypomagnesemia 05/13/2022   At risk for aspiration pneumonia due to patulous esophagus 05/13/2022   Palliative care encounter 05/13/2022   Acute respiratory failure with hypoxia ([place]) 05/08/2022   Acute postoperative pain of left knee 09/16/2021   Encounter to establish care 02/20/2020   Bilateral subdural hematomas ([place]) 07/16/2019   CHI (closed head injury), initial encounter 07/14/2019   Left radial head fracture 07/14/2019   SDH (subdural hematoma) (HCC)    Hypertensive urgency    Subarachnoid hemorrhage following injury ([place]) 07/13/2019   Acute encephalopathy 06/27/2019   Heat stroke 06/27/2019   Hypokalemia 06/27/2019   Fever, unspecified 06/27/2019   Stroke ([place]) 06/27/2019   Altered mental status 06/26/2019   Right hip pain 06/22/2019   Chronic left hip pain 05/25/2019   Depression, recurrent ([place]) 05/25/2019   Chronic anticoagulation 04/19/2019   Leg cramps 03/04/2019   Short-term memory loss 03/04/2019   DOE (dyspnea on exertion) 03/04/2019   Aortic atherosclerosis ([place]) 03/04/2019   Physical deconditioning 01/24/2019   History of recurrent UTIs 12/03/2018   Chronic pain of right knee 10/08/2018   Type 2 diabetes mellitus with diabetic neuropathy, unspecified ([place]) 08/12/2018   Pulmonary vascular congestion    Chronic diastolic CHF (congestive heart failure) ([place])    Acute hypoxemic respiratory failure ([place]) 12/06/2017    Permanent atrial fibrillation ([place]) 12/06/2017   Trochanteric bursitis, right hip 07/16/2017   Other intervertebral disc degeneration, lumbar region 07/16/2017   Osteoporosis 11/04/2016   Diabetic polyneuropathy associated with type 2 diabetes mellitus ([place]) 11/14/2014   Fibromyalgia 02/11/2013   CVA (cerebral vascular accident) ([place]) 08/01/2008   Osteoarthritis 04/09/2007   Hyperlipidemia associated with type 2 diabetes mellitus ([place]) 03/08/2007   Hypertension associated with diabetes ([place]) 03/08/2007   Pulmonary fibrosis ([place]) 03/08/2007   GERD 03/08/2007   BREAST CANCER, HX OF 03/08/2007   PAST MEDICAL HISTORY:  Active Ambulatory Problems    Diagnosis Date Noted   Hyperlipidemia associated with type 2 diabetes mellitus ([place]) 03/08/2007   Hypertension associated with diabetes ([place]) 03/08/2007   CVA (cerebral vascular accident) ([place]) 08/01/2008   Pulmonary fibrosis ([place]) 03/08/2007   GERD 03/08/2007   Osteoarthritis 04/09/2007   BREAST CANCER, HX OF 03/08/2007   Fibromyalgia 02/11/2013   Diabetic polyneuropathy associated with type 2 diabetes mellitus ([place]) 11/14/2014   Osteoporosis 11/04/2016   Trochanteric bursitis, right hip 07/16/2017   Other intervertebral disc degeneration, lumbar region 07/16/2017   Acute hypoxemic respiratory failure ([place]) 12/06/2017   Permanent atrial fibrillation ([place]) 12/06/2017   Pulmonary vascular congestion    Chronic diastolic CHF (congestive heart failure) ([place])    Type 2 diabetes mellitus with diabetic neuropathy, unspecified ([place]) 08/12/2018   Chronic pain of right knee 10/08/2018   History  of recurrent UTIs 12/03/2018   Physical deconditioning 01/24/2019   Leg cramps 03/04/2019   Short-term memory loss 03/04/2019   DOE (dyspnea on exertion) 03/04/2019   Aortic atherosclerosis ([place]) 03/04/2019   Chronic anticoagulation 04/19/2019   Chronic left hip pain 05/25/2019   Depression, recurrent ([place]) 05/25/2019   Right hip pain 06/22/2019    Altered mental status 06/26/2019   Acute encephalopathy 06/27/2019   Heat stroke 06/27/2019   Hypokalemia 06/27/2019   Fever, unspecified 06/27/2019   Stroke ([place]) 06/27/2019   Subarachnoid hemorrhage following injury ([place]) 07/13/2019   CHI (closed head injury), initial encounter 07/14/2019   Left radial head fracture 07/14/2019   SDH (subdural hematoma) (HCC)    Hypertensive urgency    Bilateral subdural hematomas ([place]) 07/16/2019   Encounter to establish care 02/20/2020   Acute postoperative pain of left knee 09/16/2021   Acute respiratory failure with hypoxia ([place]) 05/08/2022   Acute bronchitis due to Rhinovirus 05/13/2022   Hypomagnesemia 05/13/2022   At risk for aspiration pneumonia due to patulous esophagus 05/13/2022   Palliative care encounter 05/13/2022   Resolved Ambulatory Problems    Diagnosis Date Noted   Diabetes ([place]) 08/01/2008   DVT 08/01/2008   Urinary tract infection 08/02/2012   Frequency 08/02/2012   DM (diabetes mellitus) ([place]) 08/02/2012   HTN (hypertension) 08/02/2012   HLD (hyperlipidemia) 08/02/2012   Frequency of urination 11/09/2012   Arthritis 11/09/2012   HLD (hyperlipidemia) 02/11/2013   Need for prophylactic vaccination and inoculation against influenza 02/11/2013   Grieving 07/14/2013   Neck pain    Frequent UTI    Shortness of breath 09/24/2015   Nausea 03/01/2018   Abnormal weight loss 05/13/2022   Past Medical History:  Diagnosis Date   Atrial fibrillation ([place])    Cancer ([place])    Cataract    Diabetes mellitus without complication ([place])    Hyperlipidemia    Hypertension    Pneumonia    Scoliosis    SOCIAL HX:  Social History   Tobacco Use   Smoking status: Never   Smokeless tobacco: Never  Substance Use Topics   Alcohol use: No    Alcohol/week: 0.0 standard drinks of alcohol   FAMILY HX:  Family History  Problem Relation Age of Onset   Cancer Mother        lung   Arthritis Mother    Heart disease Father         endocarditis   Cancer Brother        lung, throat   Alcohol abuse Brother        Preferred Pharmacy: ALLERGIES:  Allergies  Allergen Reactions   Diltiazem Hcl Itching, Rash and Other (See Comments)    Red itchy rash started a few hours after taking short acting 120mg  dilt tabs    Duloxetine Hcl Anxiety, Rash and Other (See Comments)    Makes patient feel "restless and scared" and unable to sleep   Ace Inhibitors Cough     PERTINENT MEDICATIONS:  Outpatient Encounter Medications as of 07/31/2022  Medication Sig   acetaminophen (TYLENOL) 325 MG tablet Take 650 mg by mouth every 6 (six) hours as needed (pain).   Apoaequorin (PREVAGEN EXTRA STRENGTH) 20 MG CAPS Take 20 mg by mouth daily.   atorvastatin (LIPITOR) 40 MG tablet Take 1 tablet (40 mg total) by mouth daily. (Patient taking differently: Take 80 mg by mouth daily.)   Cholecalciferol (VITAMIN D3) 50 MCG (2000 UT) TABS Take 2,000 Units by mouth in the  morning.   cloNIDine (CATAPRES) 0.1 MG tablet Take 0.1 mg by mouth 2 (two) times daily.   D-Mannose 500 MG CAPS Take 2,000 mg by mouth daily.   estradiol (ESTRACE) 0.1 MG/GM vaginal cream Place 1 Applicatorful vaginally See admin instructions. Apply 0.5g at bedtime on Mondays and Thursdays only.   hydrALAZINE (APRESOLINE) 25 MG tablet Take 1 tablet (25 mg total) by mouth 3 (three) times daily.   hydrochlorothiazide (HYDRODIURIL) 25 MG tablet Take 50 mg by mouth daily.   HYDROcodone-acetaminophen (NORCO/VICODIN) 5-325 MG tablet Take 1 tablet by mouth See admin instructions. Take 1 tablet every morning. Take 1 tablet every 8 hours as needed for pain.   LEVEMIR FLEXTOUCH 100 UNIT/ML FlexTouch Pen Inject 6-9 Units into the skin See admin instructions. Inject 9 units in the morning and 6 units at bedtime.   loperamide (IMODIUM) 2 MG capsule Take 2 mg by mouth as needed for diarrhea or loose stools (up to 8 doses in 24 hours).   Magnesium 250 MG TABS Take 250 mg by mouth daily.   melatonin  5 MG TABS Take 5 mg by mouth at bedtime.   metFORMIN (GLUCOPHAGE) 500 MG tablet Take 1 tablet (500 mg total) by mouth daily with breakfast.   mirabegron ER (MYRBETRIQ) 25 MG TB24 tablet Take 25 mg by mouth daily.   mirtazapine (REMERON) 30 MG tablet Take 1 tablet by mouth at bedtime.   NOVOLOG FLEXPEN 100 UNIT/ML FlexPen Inject 0-10 Units into the skin See admin instructions. Sliding scale: CBG 1 to 150=0 units, 151-200=2 units, 201-250=4 units, 251-300=6 units, 301-350=8 units, 351-400=10 units >400 call Dr < 70 call Dr BID   ondansetron (ZOFRAN) 4 MG tablet Take 1 tablet (4 mg total) by mouth every 8 (eight) hours as needed for nausea or vomiting. (Patient taking differently: Take 4 mg by mouth every 6 (six) hours as needed for nausea or vomiting.)   polyethylene glycol (MIRALAX / GLYCOLAX) 17 g packet Take 17 g by mouth daily as needed (constipation).   potassium chloride (KLOR-CON M) 10 MEQ tablet Take 40 mEq by mouth daily.   rOPINIRole (REQUIP) 0.25 MG tablet Take 0.25 mg by mouth 3 (three) times daily.   sertraline (ZOLOFT) 100 MG tablet Take 100 mg by mouth daily.   [DISCONTINUED] atorvastatin (LIPITOR) 80 MG tablet Take 1 tablet (80 mg total) by mouth at bedtime.   [DISCONTINUED] celecoxib (CELEBREX) 100 MG capsule Take 100 mg by mouth 2 (two) times daily.   [DISCONTINUED] docusate sodium (COLACE) 100 MG capsule Take 100 mg by mouth daily.   [DISCONTINUED] fluticasone (FLONASE) 50 MCG/ACT nasal spray Place 1 spray into both nostrils daily.   [DISCONTINUED] ipratropium-albuterol (DUONEB) 0.5-2.5 (3) MG/3ML SOLN Take 3 mLs by nebulization every 6 (six) hours as needed (wheezing/SOB).   No facility-administered encounter medications on file as of 07/31/2022.    History obtained from review of EMR, discussion with facility staff/caregiver and/or patient.  I reviewed available labs, medications, imaging, studies and related documents from the EMR.  There were no new records/imaging since  last visit.   Physical Exam: GENERAL: NAD LUNGS: CTAB, no increased work of breathing, room air CARDIAC:  S1S2, RRR with RSB murmur, No edema/cyanosis ABD:  Normo-active BS x 4 quads, soft protuberant umbilical hernia (non-tender) soft, non-tender EXTREMITIES: Normal ROM, no deformity, strength equal, No muscle atrophy/subcutaneous fat loss NEURO:  No weakness, mild cognitive impairment PSYCH:  non-anxious affect, A & O x 2  Thank you for the opportunity to participate  in the care of Elissa Lefevers. Please call our main office at [phone] if we can be of additional assistance.    Damaris Hippo FNP-C  [email or web address] [facility]  Phone:  [phone]

## 2022-08-01 ENCOUNTER — Encounter: Payer: Self-pay | Admitting: Adult Health

## 2022-08-01 ENCOUNTER — Ambulatory Visit: Payer: PPO | Attending: Adult Health | Admitting: Adult Health

## 2022-08-01 VITALS — BP 122/74 | HR 50 | Ht 60.0 in | Wt 133.6 lb

## 2022-08-01 DIAGNOSIS — E7800 Pure hypercholesterolemia, unspecified: Secondary | ICD-10-CM

## 2022-08-01 DIAGNOSIS — E1140 Type 2 diabetes mellitus with diabetic neuropathy, unspecified: Secondary | ICD-10-CM | POA: Diagnosis not present

## 2022-08-01 DIAGNOSIS — I4821 Permanent atrial fibrillation: Secondary | ICD-10-CM

## 2022-08-01 DIAGNOSIS — I10 Essential (primary) hypertension: Secondary | ICD-10-CM

## 2022-08-01 DIAGNOSIS — Z794 Long term (current) use of insulin: Secondary | ICD-10-CM | POA: Diagnosis not present

## 2022-08-01 MED ORDER — ATORVASTATIN CALCIUM 40 MG PO TABS: 40.0000 mg | ORAL_TABLET | Freq: Every day | ORAL | 3 refills | Status: AC

## 2022-08-01 NOTE — Patient Instructions (Signed)
Medication Instructions:  Start taking Atorvastatin (Lipitor) 40 MG daily.  *If you need a refill on your cardiac medications before your next appointment, please call your pharmacy*   Lab Work: None ordered   Testing/Procedures: None ordered   Follow-Up: At [facility], you and your health needs are our priority.  As part of our continuing mission to provide you with exceptional heart care, we have created designated Provider Care Teams.  These Care Teams include your primary Cardiologist (physician) and Advanced Practice Providers (APPs -  Physician Assistants and Nurse Practitioners) who all work together to provide you with the care you need, when you need it.  We recommend signing up for the patient portal called "MyChart".  Sign up information is provided on this After Visit Summary.  MyChart is used to connect with patients for Virtual Visits (Telemedicine).  Patients are able to view lab/test results, encounter notes, upcoming appointments, etc.  Non-urgent messages can be sent to your provider as well.   To learn more about what you can do with MyChart, go to [email or web address].    Your next appointment:   1 year(s)  Provider:   Peter Martinique, MD

## 2022-08-03 ENCOUNTER — Encounter: Payer: Self-pay | Admitting: Family Medicine

## 2022-08-04 DIAGNOSIS — R41841 Cognitive communication deficit: Secondary | ICD-10-CM | POA: Diagnosis not present

## 2022-08-04 DIAGNOSIS — Z7409 Other reduced mobility: Secondary | ICD-10-CM | POA: Diagnosis not present

## 2022-08-04 DIAGNOSIS — M6281 Muscle weakness (generalized): Secondary | ICD-10-CM | POA: Diagnosis not present

## 2022-08-07 DIAGNOSIS — N390 Urinary tract infection, site not specified: Secondary | ICD-10-CM | POA: Diagnosis not present

## 2022-08-09 DIAGNOSIS — N390 Urinary tract infection, site not specified: Secondary | ICD-10-CM | POA: Diagnosis not present

## 2022-08-11 DIAGNOSIS — E559 Vitamin D deficiency, unspecified: Secondary | ICD-10-CM | POA: Diagnosis not present

## 2022-08-11 DIAGNOSIS — N390 Urinary tract infection, site not specified: Secondary | ICD-10-CM | POA: Diagnosis not present

## 2022-08-11 DIAGNOSIS — I739 Peripheral vascular disease, unspecified: Secondary | ICD-10-CM | POA: Diagnosis not present

## 2022-08-11 DIAGNOSIS — R5381 Other malaise: Secondary | ICD-10-CM | POA: Diagnosis not present

## 2022-08-11 DIAGNOSIS — G4700 Insomnia, unspecified: Secondary | ICD-10-CM | POA: Diagnosis not present

## 2022-08-11 DIAGNOSIS — N189 Chronic kidney disease, unspecified: Secondary | ICD-10-CM | POA: Diagnosis not present

## 2022-08-11 DIAGNOSIS — K429 Umbilical hernia without obstruction or gangrene: Secondary | ICD-10-CM | POA: Diagnosis not present

## 2022-08-11 DIAGNOSIS — I10 Essential (primary) hypertension: Secondary | ICD-10-CM | POA: Diagnosis not present

## 2022-08-11 DIAGNOSIS — E119 Type 2 diabetes mellitus without complications: Secondary | ICD-10-CM | POA: Diagnosis not present

## 2022-08-11 DIAGNOSIS — K219 Gastro-esophageal reflux disease without esophagitis: Secondary | ICD-10-CM | POA: Diagnosis not present

## 2022-08-11 DIAGNOSIS — G8929 Other chronic pain: Secondary | ICD-10-CM | POA: Diagnosis not present

## 2022-08-18 DIAGNOSIS — D508 Other iron deficiency anemias: Secondary | ICD-10-CM | POA: Diagnosis not present

## 2022-08-18 DIAGNOSIS — I739 Peripheral vascular disease, unspecified: Secondary | ICD-10-CM | POA: Diagnosis not present

## 2022-08-18 DIAGNOSIS — K219 Gastro-esophageal reflux disease without esophagitis: Secondary | ICD-10-CM | POA: Diagnosis not present

## 2022-08-18 DIAGNOSIS — E559 Vitamin D deficiency, unspecified: Secondary | ICD-10-CM | POA: Diagnosis not present

## 2022-08-18 DIAGNOSIS — N189 Chronic kidney disease, unspecified: Secondary | ICD-10-CM | POA: Diagnosis not present

## 2022-08-18 DIAGNOSIS — R5381 Other malaise: Secondary | ICD-10-CM | POA: Diagnosis not present

## 2022-08-18 DIAGNOSIS — E119 Type 2 diabetes mellitus without complications: Secondary | ICD-10-CM | POA: Diagnosis not present

## 2022-08-18 DIAGNOSIS — I10 Essential (primary) hypertension: Secondary | ICD-10-CM | POA: Diagnosis not present

## 2022-08-18 DIAGNOSIS — G4700 Insomnia, unspecified: Secondary | ICD-10-CM | POA: Diagnosis not present

## 2022-08-18 DIAGNOSIS — N3281 Overactive bladder: Secondary | ICD-10-CM | POA: Diagnosis not present

## 2022-08-18 DIAGNOSIS — K5900 Constipation, unspecified: Secondary | ICD-10-CM | POA: Diagnosis not present

## 2022-08-19 ENCOUNTER — Ambulatory Visit: Payer: Self-pay | Admitting: Surgery

## 2022-08-19 ENCOUNTER — Encounter (HOSPITAL_COMMUNITY): Payer: Self-pay | Admitting: Surgery

## 2022-08-19 NOTE — Progress Notes (Signed)
Patient resides at [facility].  Spoke with Bonita Quin, DON .  Will fax information and instructions for DOS to [facility] at [phone].  Phone [phone].   Surgical Instructions for [name]    Your procedure is scheduled on Thursday, 08/20/22.  Report to [facility] Main Entrance "A" at 6:00 A.M., then check in with the Admitting office.  Call this number if you have problems the morning of surgery:  [phone]   If you have any questions prior to your surgery date call [phone]: Open Monday-Friday 8am-4pm If you experience any cold or flu symptoms such as cough, fever, chills, shortness of breath, etc. between now and your scheduled surgery, please notify us at the above number     Remember:  Do not eat after midnight the night before your surgery  You may drink clear liquids until 5:30 AM the morning of your surgery.   Clear liquids allowed are: Water, Non-Citrus Juices (without pulp), Carbonated Beverages, Clear Tea, Black Coffee ONLY (NO MILK, CREAM OR POWDERED CREAMER of any kind), and Gatorade    Take these medicines the morning of surgery with A SIP OF WATER:  acetaminophen (TYLENOL)  if needed PREVAGEN  atorvastatin (LIPITOR) cloNIDine (CATAPRES)  hydrALAZINE (APRESOLINE)  HYDROcodone-acetaminophen (NORCO/VICODIN)  mirabegron ER (MYRBETRIQ)  ondansetron (ZOFRAN) if needed rOPINIRole (REQUIP)  sertraline (ZOLOFT)    As of today, STOP taking any Aspirin (unless otherwise instructed by your surgeon) Aleve, Naproxen, Ibuprofen, Motrin, Advil, Goody's, BC's, all herbal medications, fish oil, and all vitamins.           Do not wear jewelry or makeup. Do not wear lotions, powders, perfumes or deodorant. Do not shave 48 hours prior to surgery.   Do not bring valuables to the hospital. Do not wear nail polish, gel polish, artificial nails, or any other type of covering on natural nails (fingers and toes) If you have artificial nails or gel coating  that need to be removed by a nail salon, please have this removed prior to surgery. Artificial nails or gel coating may interfere with anesthesia's ability to adequately monitor your vital signs.  [facility] is not responsible for any belongings or valuables.     Contacts, glasses, hearing aids, dentures or partials may not be worn into surgery, please bring cases for these belongings   For patients admitted to the hospital, discharge time will be determined by your treatment team.    SURGICAL WAITING ROOM VISITATION Patients having surgery or a procedure may have no more than 2 support people in the waiting area - these visitors may rotate.   Children under the age of 72 must have an adult with them who is not the patient. If the patient needs to stay at the hospital during part of their recovery, the visitor guidelines for inpatient rooms apply. Pre-op nurse will coordinate an appropriate time for 1 support person to accompany patient in pre-op.  This support person may not rotate.   Please refer to [email or web address] for the visitor guidelines for Inpatients (after your surgery is over and you are in a regular room).    Special instructions:    Oral Hygiene is also important to reduce your risk of infection.  Remember - BRUSH YOUR TEETH THE MORNING OF SURGERY WITH YOUR REGULAR TOOTHPASTE

## 2022-08-19 NOTE — Anesthesia Preprocedure Evaluation (Addendum)
Anesthesia Evaluation  Patient identified by MRN, date of birth, ID band Patient awake    Reviewed: Allergy & Precautions, H&P , NPO status , Patient's Chart, lab work & pertinent test results  Airway Mallampati: III  TM Distance: >3 FB Neck ROM: Full    Dental no notable dental hx. (+) Teeth Intact, Dental Advisory Given   Pulmonary neg pulmonary ROS   Pulmonary exam normal breath sounds clear to auscultation       Cardiovascular Exercise Tolerance: Good hypertension, Pt. on medications + DOE  + dysrhythmias Atrial Fibrillation  Rhythm:Regular Rate:Normal     Neuro/Psych    Depression    CVA, No Residual Symptoms    GI/Hepatic Neg liver ROS,GERD  Medicated,,  Endo/Other  diabetes, Type 2, Oral Hypoglycemic Agents, Insulin Dependent    Renal/GU negative Renal ROS  negative genitourinary   Musculoskeletal  (+) Arthritis , Osteoarthritis,  Fibromyalgia -  Abdominal   Peds  Hematology negative hematology ROS (+)   Anesthesia Other Findings   Reproductive/Obstetrics negative OB ROS                             Anesthesia Physical Anesthesia Plan  ASA: 3  Anesthesia Plan: General   Post-op Pain Management: Tylenol PO (pre-op)*   Induction: Intravenous  PONV Risk Score and Plan: 4 or greater and Ondansetron, Dexamethasone, TIVA and Propofol infusion  Airway Management Planned: Oral ETT  Additional Equipment:   Intra-op Plan:   Post-operative Plan: Extubation in OR  Informed Consent: I have reviewed the patients History and Physical, chart, labs and discussed the procedure including the risks, benefits and alternatives for the proposed anesthesia with the patient or authorized representative who has indicated his/her understanding and acceptance.     Dental advisory given  Plan Discussed with: CRNA  Anesthesia Plan Comments: ([place] note by Antionette Poles, PA-C: 89 yo female with  hx of hypertension, HL, Type II diabetes, atrial fibrillation (not currently on anticoagulation due to hx of dalls and SDH), CVA, chronic diastolic CHF and pulmonary fibrosis. She is currently under Palliative Care with plans per Hospice Care do to decline in function, weight, eating.  Last seen by cardiology APP Joni Reining, DNP 08/01/22. Stable at that time from cardiology standpoint, no changes to management. She previously had preop risk assessment by Edd Fabian, NP on 03/24/22. Per note, "Chart reviewed as part of pre-operative protocol coverage. Given past medical history and time since last visit, based on ACC/AHA guidelines, BRIGIT DOKE would be at acceptable risk for the planned procedure without further cardiovascular testing. Her RCRI is a class IV risk, 11% risk of major cardiac event.  She is able to complete greater than 4 METS of physical activity. Patient was advised that if she develops new symptoms prior to surgery to contact our office to arrange a follow-up appointment.  He verbalized understanding."  IDDM2, A1c 6.3 on 05/08/22.  Pt will need DOS labs and eval.  EKG 05/10/22: Junctional rhythm. T wave abnormality, consider anterolateral ischemia. Prolonged QT (QTCb 481)  TTE 06/27/19: 1. Left ventricular ejection fraction, by estimation, is 60 to 65%. The  left ventricle has normal function. The left ventricle has no regional  wall motion abnormalities.LV diastolic filling could not be assessed due  to underlying atrial fibrillation.  2. Right ventricular systolic function is normal. The right ventricular  size is normal.  3. The mitral valve is degenerative. No evidence of mitral valve  regurgitation. No evidence of mitral stenosis.  4. The aortic valve is tricuspid. Aortic valve regurgitation is not  visualized. Mild to moderate aortic valve sclerosis/calcification is  present, without any evidence of aortic stenosis.  5. The inferior vena cava is normal in  size with greater than 50%  respiratory variability, suggesting right atrial pressure of 3 mmHg.  6. Left atrial size was mild to moderately dilated.   )        Anesthesia Quick Evaluation

## 2022-08-19 NOTE — Progress Notes (Signed)
Patient resides at [facility].  Spoke with Bonita Quin, DON .  Will fax information and instructions for DOS to [place] at [phone]. Phone [phone].   PCP - Lynnell Chad, NP Cardiologist - Dr Peter [place]  Chest x-ray - 05/08/22 EKG - 05/10/22 Stress Test - n/a ECHO - 06/27/19 Cardiac Cath - n/a  ICD Pacemaker/Loop - n/a  Sleep Study -  n/a CPAP - none  Diabetes Type 2 Do not take Metformin on the morning of surgery.  THE NIGHT BEFORE SURGERY, take 3 units of Levemir  Insulin.      THE MORNING OF SURGERY, take 4 units of Levemir Insulin.  If your blood sugar is less than 70 mg/dL, you will need to treat for low blood sugar: Treat a low blood sugar (less than 70 mg/dL) with  cup of clear juice (cranberry or apple), 4 glucose tablets, OR glucose gel. Recheck blood sugar in 15 minutes after treatment (to make sure it is greater than 70 mg/dL). If your blood sugar is not greater than 70 mg/dL on recheck, call [phone] for further instructions.  ERAS: Clear liquids til 5:30 AM DOS  Anesthesia review: Yes  STOP now taking any Aspirin (unless otherwise instructed by your surgeon), Aleve, Naproxen, Ibuprofen, Motrin, Advil, Goody's, BC's, all herbal medications, fish oil, and all vitamins.   Coronavirus Screening Does the patient have any of the following symptoms:  Cough yes/no: No Fever (>100.85F)  yes/no: No Runny nose yes/no: No Sore throat yes/no: No Difficulty breathing/shortness of breath  yes/no: No  Has the patient traveled in the last 14 days and where? yes/no: No

## 2022-08-19 NOTE — Progress Notes (Signed)
Anesthesia Chart Review: Same day workup  89 yo female with hx of hypertension, HL, Type II diabetes, atrial fibrillation (not currently on anticoagulation due to hx of dalls and SDH), CVA, chronic diastolic CHF and pulmonary fibrosis. She is currently under Palliative Care with plans per [facility] do to decline in function, weight, eating.  Last seen by cardiology APP Joni Reining, DNP 08/01/22. Stable at that time from cardiology standpoint, no changes to management. She previously had preop risk assessment by Edd Fabian, NP on 03/24/22. Per note, "Chart reviewed as part of pre-operative protocol coverage. Given past medical history and time since last visit, based on ACC/AHA guidelines, [name] would be at acceptable risk for the planned procedure without further cardiovascular testing. Her RCRI is a class IV risk, 11% risk of major cardiac event.  She is able to complete greater than 4 METS of physical activity. Patient was advised that if she develops new symptoms prior to surgery to contact our office to arrange a follow-up appointment.  He verbalized understanding."  IDDM2, A1c 6.3 on 05/08/22.  Pt will need DOS labs and eval.  EKG 05/10/22: Junctional rhythm. T wave abnormality, consider anterolateral ischemia. Prolonged QT (QTCb 481)  TTE 06/27/19:  1. Left ventricular ejection fraction, by estimation, is 60 to 65%. The  left ventricle has normal function. The left ventricle has no regional  wall motion abnormalities.LV diastolic filling could not be assessed due  to underlying atrial fibrillation.   2. Right ventricular systolic function is normal. The right ventricular  size is normal.   3. The mitral valve is degenerative. No evidence of mitral valve  regurgitation. No evidence of mitral stenosis.   4. The aortic valve is tricuspid. Aortic valve regurgitation is not  visualized. Mild to moderate aortic valve sclerosis/calcification is  present, without any evidence of  aortic stenosis.   5. The inferior vena cava is normal in size with greater than 50%  respiratory variability, suggesting right atrial pressure of 3 mmHg.   6. Left atrial size was mild to moderately dilated.    Zannie Cove [place] [facility] Stay [facility]/Anesthesiology Phone [phone] 08/19/2022 10:19 AM

## 2022-08-20 ENCOUNTER — Encounter (HOSPITAL_COMMUNITY): Payer: Self-pay | Admitting: Surgery

## 2022-08-20 ENCOUNTER — Ambulatory Visit (HOSPITAL_BASED_OUTPATIENT_CLINIC_OR_DEPARTMENT_OTHER): Payer: PPO | Admitting: Physician Assistant

## 2022-08-20 ENCOUNTER — Other Ambulatory Visit: Payer: Self-pay

## 2022-08-20 ENCOUNTER — Ambulatory Visit (HOSPITAL_COMMUNITY): Payer: PPO | Admitting: Physician Assistant

## 2022-08-20 ENCOUNTER — Observation Stay (HOSPITAL_COMMUNITY)
Admission: RE | Admit: 2022-08-20 | Discharge: 2022-08-21 | Disposition: A | Discharge status: Skilled Nursing Facility | Payer: PPO | Source: Ambulatory Visit | Attending: Surgery | Admitting: Surgery

## 2022-08-20 ENCOUNTER — Encounter (HOSPITAL_COMMUNITY)
Admission: RE | Disposition: A | Discharge status: Skilled Nursing Facility | Payer: Self-pay | Source: Ambulatory Visit | Attending: Surgery

## 2022-08-20 DIAGNOSIS — K429 Umbilical hernia without obstruction or gangrene: Secondary | ICD-10-CM

## 2022-08-20 DIAGNOSIS — E119 Type 2 diabetes mellitus without complications: Secondary | ICD-10-CM | POA: Diagnosis not present

## 2022-08-20 DIAGNOSIS — Z794 Long term (current) use of insulin: Secondary | ICD-10-CM | POA: Diagnosis not present

## 2022-08-20 DIAGNOSIS — I4891 Unspecified atrial fibrillation: Secondary | ICD-10-CM | POA: Diagnosis not present

## 2022-08-20 DIAGNOSIS — I5032 Chronic diastolic (congestive) heart failure: Secondary | ICD-10-CM | POA: Insufficient documentation

## 2022-08-20 DIAGNOSIS — Z79899 Other long term (current) drug therapy: Secondary | ICD-10-CM | POA: Insufficient documentation

## 2022-08-20 DIAGNOSIS — I4821 Permanent atrial fibrillation: Secondary | ICD-10-CM | POA: Insufficient documentation

## 2022-08-20 DIAGNOSIS — Z7984 Long term (current) use of oral hypoglycemic drugs: Secondary | ICD-10-CM

## 2022-08-20 DIAGNOSIS — I110 Hypertensive heart disease with heart failure: Secondary | ICD-10-CM | POA: Diagnosis not present

## 2022-08-20 DIAGNOSIS — I10 Essential (primary) hypertension: Secondary | ICD-10-CM | POA: Diagnosis not present

## 2022-08-20 HISTORY — PX: UMBILICAL HERNIA REPAIR: SHX196

## 2022-08-20 LAB — GLUCOSE, CAPILLARY
Glucose-Capillary: 84 mg/dL (ref 70–99)
Glucose-Capillary: 114 mg/dL — ABNORMAL HIGH (ref 70–99)
Glucose-Capillary: 134 mg/dL — ABNORMAL HIGH (ref 70–99)
Glucose-Capillary: 193 mg/dL — ABNORMAL HIGH (ref 70–99)

## 2022-08-20 SURGERY — REPAIR, HERNIA, UMBILICAL, ADULT
Anesthesia: General | Site: Abdomen

## 2022-08-20 MED ORDER — LIDOCAINE 2% (20 MG/ML) 5 ML SYRINGE
INTRAMUSCULAR | Status: AC
  Filled 2022-08-20: qty 5

## 2022-08-20 MED ORDER — MELATONIN 5 MG PO TABS
5.0000 mg | ORAL_TABLET | Freq: Every day | ORAL | Status: DC
  Administered 2022-08-20: 5 mg via ORAL
  Filled 2022-08-20: qty 1

## 2022-08-20 MED ORDER — GUAIFENESIN-DM 100-10 MG/5ML PO SYRP: 15.0000 mL | ORAL_SOLUTION | ORAL | Status: DC | PRN

## 2022-08-20 MED ORDER — POTASSIUM CHLORIDE CRYS ER 20 MEQ PO TBCR
40.0000 meq | EXTENDED_RELEASE_TABLET | Freq: Every day | ORAL | Status: DC
  Administered 2022-08-20 – 2022-08-21 (×2): 40 meq via ORAL
  Filled 2022-08-20 (×2): qty 2

## 2022-08-20 MED ORDER — DEXAMETHASONE SODIUM PHOSPHATE 10 MG/ML IJ SOLN
INTRAMUSCULAR | Status: DC | PRN
  Administered 2022-08-20: 5 mg via INTRAVENOUS

## 2022-08-20 MED ORDER — DIPHENHYDRAMINE HCL 12.5 MG/5ML PO ELIX: 12.5000 mg | ORAL_SOLUTION | Freq: Four times a day (QID) | ORAL | Status: DC | PRN

## 2022-08-20 MED ORDER — SERTRALINE HCL 100 MG PO TABS
100.0000 mg | ORAL_TABLET | Freq: Every day | ORAL | Status: DC
  Administered 2022-08-20 – 2022-08-21 (×2): 100 mg via ORAL
  Filled 2022-08-20 (×2): qty 1

## 2022-08-20 MED ORDER — MAGNESIUM OXIDE -MG SUPPLEMENT 400 (240 MG) MG PO TABS
200.0000 mg | ORAL_TABLET | Freq: Every day | ORAL | Status: DC
  Administered 2022-08-20 – 2022-08-21 (×2): 200 mg via ORAL
  Filled 2022-08-20 (×2): qty 1

## 2022-08-20 MED ORDER — MORPHINE SULFATE (PF) 2 MG/ML IV SOLN: 2.0000 mg | INTRAVENOUS | Status: DC | PRN

## 2022-08-20 MED ORDER — ACETAMINOPHEN 500 MG PO TABS
1000.0000 mg | ORAL_TABLET | ORAL | Status: AC
  Administered 2022-08-20: 1000 mg via ORAL
  Filled 2022-08-20: qty 2

## 2022-08-20 MED ORDER — MIRABEGRON ER 25 MG PO TB24
25.0000 mg | ORAL_TABLET | Freq: Every day | ORAL | Status: DC
  Administered 2022-08-20 – 2022-08-21 (×2): 25 mg via ORAL
  Filled 2022-08-20 (×2): qty 1

## 2022-08-20 MED ORDER — ONDANSETRON HCL 4 MG/2ML IJ SOLN
INTRAMUSCULAR | Status: AC
  Filled 2022-08-20: qty 2

## 2022-08-20 MED ORDER — HYDRALAZINE HCL 20 MG/ML IJ SOLN
INTRAMUSCULAR | Status: AC
  Filled 2022-08-20: qty 1

## 2022-08-20 MED ORDER — CHLORHEXIDINE GLUCONATE 0.12 % MT SOLN
15.0000 mL | Freq: Once | OROMUCOSAL | Status: AC
  Administered 2022-08-20: 15 mL via OROMUCOSAL
  Filled 2022-08-20: qty 15

## 2022-08-20 MED ORDER — CLONIDINE HCL 0.1 MG PO TABS
0.1000 mg | ORAL_TABLET | Freq: Two times a day (BID) | ORAL | Status: DC
  Administered 2022-08-21: 0.1 mg via ORAL
  Filled 2022-08-20 (×2): qty 1

## 2022-08-20 MED ORDER — ACETAMINOPHEN 325 MG PO TABS: 650.0000 mg | ORAL_TABLET | Freq: Four times a day (QID) | ORAL | Status: DC | PRN

## 2022-08-20 MED ORDER — ATORVASTATIN CALCIUM 40 MG PO TABS
40.0000 mg | ORAL_TABLET | Freq: Every evening | ORAL | Status: DC
  Administered 2022-08-20: 40 mg via ORAL

## 2022-08-20 MED ORDER — APOAEQUORIN 20 MG PO CAPS: 20.0000 mg | ORAL_CAPSULE | Freq: Every day | ORAL | Status: DC

## 2022-08-20 MED ORDER — SUGAMMADEX SODIUM 200 MG/2ML IV SOLN
INTRAVENOUS | Status: DC | PRN
  Administered 2022-08-20: 200 mg via INTRAVENOUS

## 2022-08-20 MED ORDER — PHENYLEPHRINE HCL-NACL 20-0.9 MG/250ML-% IV SOLN
INTRAVENOUS | Status: DC | PRN
  Administered 2022-08-20: 10 ug/min via INTRAVENOUS

## 2022-08-20 MED ORDER — ROPINIROLE HCL 0.25 MG PO TABS
0.2500 mg | ORAL_TABLET | Freq: Three times a day (TID) | ORAL | Status: DC
  Administered 2022-08-20 – 2022-08-21 (×4): 0.25 mg via ORAL
  Filled 2022-08-20 (×5): qty 1

## 2022-08-20 MED ORDER — HYDROCHLOROTHIAZIDE 25 MG PO TABS
50.0000 mg | ORAL_TABLET | Freq: Every day | ORAL | Status: DC
  Administered 2022-08-21: 50 mg via ORAL
  Filled 2022-08-20: qty 2

## 2022-08-20 MED ORDER — CHLORHEXIDINE GLUCONATE CLOTH 2 % EX PADS: 6.0000 | MEDICATED_PAD | Freq: Once | CUTANEOUS | Status: DC

## 2022-08-20 MED ORDER — ENOXAPARIN SODIUM 40 MG/0.4ML IJ SOSY
40.0000 mg | PREFILLED_SYRINGE | INTRAMUSCULAR | Status: DC
  Administered 2022-08-21: 40 mg via SUBCUTANEOUS
  Filled 2022-08-20: qty 0.4

## 2022-08-20 MED ORDER — ROCURONIUM BROMIDE 10 MG/ML (PF) SYRINGE
PREFILLED_SYRINGE | INTRAVENOUS | Status: DC | PRN
  Administered 2022-08-20: 30 mg via INTRAVENOUS

## 2022-08-20 MED ORDER — LACTATED RINGERS IV SOLN: INTRAVENOUS | Status: DC

## 2022-08-20 MED ORDER — 0.9 % SODIUM CHLORIDE (POUR BTL) OPTIME
TOPICAL | Status: DC | PRN
  Administered 2022-08-20: 1000 mL

## 2022-08-20 MED ORDER — METFORMIN HCL 500 MG PO TABS
500.0000 mg | ORAL_TABLET | Freq: Every day | ORAL | Status: DC
  Administered 2022-08-21: 500 mg via ORAL
  Filled 2022-08-20: qty 1

## 2022-08-20 MED ORDER — FENTANYL CITRATE (PF) 250 MCG/5ML IJ SOLN
INTRAMUSCULAR | Status: DC | PRN
  Administered 2022-08-20: 50 ug via INTRAVENOUS

## 2022-08-20 MED ORDER — BUPIVACAINE-EPINEPHRINE 0.25% -1:200000 IJ SOLN
INTRAMUSCULAR | Status: DC | PRN
  Administered 2022-08-20: 10 mL

## 2022-08-20 MED ORDER — INSULIN ASPART 100 UNIT/ML IJ SOLN
0.0000 [IU] | Freq: Three times a day (TID) | INTRAMUSCULAR | Status: DC
  Administered 2022-08-21 (×2): 1 [IU] via SUBCUTANEOUS

## 2022-08-20 MED ORDER — ROCURONIUM BROMIDE 10 MG/ML (PF) SYRINGE
PREFILLED_SYRINGE | INTRAVENOUS | Status: AC
  Filled 2022-08-20: qty 10

## 2022-08-20 MED ORDER — MAGNESIUM 250 MG PO TABS: 250.0000 mg | ORAL_TABLET | Freq: Every evening | ORAL | Status: DC

## 2022-08-20 MED ORDER — HYDRALAZINE HCL 25 MG PO TABS
25.0000 mg | ORAL_TABLET | Freq: Three times a day (TID) | ORAL | Status: DC
  Administered 2022-08-21: 25 mg via ORAL
  Filled 2022-08-20 (×3): qty 1

## 2022-08-20 MED ORDER — FENTANYL CITRATE (PF) 100 MCG/2ML IJ SOLN
25.0000 ug | INTRAMUSCULAR | Status: DC | PRN
  Administered 2022-08-20 (×3): 25 ug via INTRAVENOUS

## 2022-08-20 MED ORDER — DEXAMETHASONE SODIUM PHOSPHATE 10 MG/ML IJ SOLN
INTRAMUSCULAR | Status: AC
  Filled 2022-08-20: qty 1

## 2022-08-20 MED ORDER — FENTANYL CITRATE (PF) 250 MCG/5ML IJ SOLN
INTRAMUSCULAR | Status: AC
  Filled 2022-08-20: qty 5

## 2022-08-20 MED ORDER — MIRTAZAPINE 15 MG PO TABS
30.0000 mg | ORAL_TABLET | Freq: Every day | ORAL | Status: DC
  Administered 2022-08-20: 30 mg via ORAL
  Filled 2022-08-20: qty 2

## 2022-08-20 MED ORDER — HYDROCODONE-ACETAMINOPHEN 5-325 MG PO TABS
1.0000 | ORAL_TABLET | ORAL | Status: DC | PRN
  Administered 2022-08-21 (×2): 1 via ORAL
  Filled 2022-08-20 (×2): qty 1

## 2022-08-20 MED ORDER — LIDOCAINE 2% (20 MG/ML) 5 ML SYRINGE
INTRAMUSCULAR | Status: DC | PRN
  Administered 2022-08-20: 60 mg via INTRAVENOUS

## 2022-08-20 MED ORDER — DIPHENHYDRAMINE HCL 50 MG/ML IJ SOLN: 12.5000 mg | Freq: Four times a day (QID) | INTRAMUSCULAR | Status: DC | PRN

## 2022-08-20 MED ORDER — TRAMADOL HCL 50 MG PO TABS: 50.0000 mg | ORAL_TABLET | Freq: Four times a day (QID) | ORAL | Status: DC | PRN

## 2022-08-20 MED ORDER — CEFAZOLIN SODIUM-DEXTROSE 2-4 GM/100ML-% IV SOLN
2.0000 g | INTRAVENOUS | Status: AC
  Administered 2022-08-20: 2 g via INTRAVENOUS
  Filled 2022-08-20: qty 100

## 2022-08-20 MED ORDER — SODIUM CHLORIDE 0.9 % IV SOLN: INTRAVENOUS | Status: DC

## 2022-08-20 MED ORDER — PROPOFOL 10 MG/ML IV BOLUS
INTRAVENOUS | Status: AC
  Filled 2022-08-20: qty 20

## 2022-08-20 MED ORDER — ONDANSETRON HCL 4 MG PO TABS
4.0000 mg | ORAL_TABLET | Freq: Four times a day (QID) | ORAL | Status: DC | PRN
  Administered 2022-08-20: 4 mg via ORAL
  Filled 2022-08-20: qty 1

## 2022-08-20 MED ORDER — ONDANSETRON HCL 4 MG/2ML IJ SOLN
INTRAMUSCULAR | Status: DC | PRN
  Administered 2022-08-20: 4 mg via INTRAVENOUS

## 2022-08-20 MED ORDER — ORAL CARE MOUTH RINSE: 15.0000 mL | Freq: Once | OROMUCOSAL | Status: AC

## 2022-08-20 MED ORDER — HYDRALAZINE HCL 20 MG/ML IJ SOLN
INTRAMUSCULAR | Status: DC | PRN
  Administered 2022-08-20: 10 mg via INTRAVENOUS

## 2022-08-20 MED ORDER — FENTANYL CITRATE (PF) 100 MCG/2ML IJ SOLN
INTRAMUSCULAR | Status: AC
  Filled 2022-08-20: qty 2

## 2022-08-20 MED ORDER — PROPOFOL 10 MG/ML IV BOLUS
INTRAVENOUS | Status: DC | PRN
  Administered 2022-08-20: 150 ug/kg/min via INTRAVENOUS
  Administered 2022-08-20: 80 mg via INTRAVENOUS

## 2022-08-20 SURGICAL SUPPLY — 38 items
APL PRP STRL LF DISP 70% ISPRP (MISCELLANEOUS) ×1
APL SKNCLS STERI-STRIP NONHPOA (GAUZE/BANDAGES/DRESSINGS) ×1
BAG COUNTER SPONGE SURGICOUNT (BAG) ×2 IMPLANT
BAG SPNG CNTER NS LX DISP (BAG) ×1
BENZOIN TINCTURE PRP APPL 2/3 (GAUZE/BANDAGES/DRESSINGS) ×2 IMPLANT
BLADE CLIPPER SURG (BLADE) IMPLANT
CANISTER SUCT 3000ML PPV (MISCELLANEOUS) IMPLANT
CHLORAPREP W/TINT 26 (MISCELLANEOUS) ×2 IMPLANT
COVER SURGICAL LIGHT HANDLE (MISCELLANEOUS) ×2 IMPLANT
DRAPE LAPAROTOMY 100X72 PEDS (DRAPES) ×2 IMPLANT
DRSG TEGADERM 4X4.75 (GAUZE/BANDAGES/DRESSINGS) ×2 IMPLANT
ELECT CAUTERY BLADE 6.4 (BLADE) IMPLANT
ELECT REM PT RETURN 9FT ADLT (ELECTROSURGICAL) ×1
ELECTRODE REM PT RTRN 9FT ADLT (ELECTROSURGICAL) ×2 IMPLANT
GAUZE 4X4 16PLY ~~LOC~~+RFID DBL (SPONGE) ×2 IMPLANT
GAUZE SPONGE 2X2 8PLY STRL LF (GAUZE/BANDAGES/DRESSINGS) ×2 IMPLANT
GLOVE BIO SURGEON STRL SZ7 (GLOVE) ×2 IMPLANT
GLOVE BIOGEL PI IND STRL 7.5 (GLOVE) ×2 IMPLANT
GOWN STRL REUS W/ TWL LRG LVL3 (GOWN DISPOSABLE) ×4 IMPLANT
GOWN STRL REUS W/TWL LRG LVL3 (GOWN DISPOSABLE) ×2
KIT BASIN OR (CUSTOM PROCEDURE TRAY) ×2 IMPLANT
KIT TURNOVER KIT B (KITS) ×2 IMPLANT
MESH VENTRALEX ST 1-7/10 CRC S (Mesh General) IMPLANT
NDL HYPO 25GX1X1/2 BEV (NEEDLE) ×2 IMPLANT
NEEDLE HYPO 25GX1X1/2 BEV (NEEDLE) ×1 IMPLANT
NS IRRIG 1000ML POUR BTL (IV SOLUTION) ×2 IMPLANT
PACK GENERAL/GYN (CUSTOM PROCEDURE TRAY) ×2 IMPLANT
PAD ARMBOARD 7.5X6 YLW CONV (MISCELLANEOUS) ×2 IMPLANT
PENCIL SMOKE EVACUATOR (MISCELLANEOUS) ×2 IMPLANT
STRIP CLOSURE SKIN 1/2X4 (GAUZE/BANDAGES/DRESSINGS) ×2 IMPLANT
SUT MNCRL AB 4-0 PS2 18 (SUTURE) ×2 IMPLANT
SUT NOVA NAB DX-16 0-1 5-0 T12 (SUTURE) ×2 IMPLANT
SUT NOVA NAB GS-21 0 18 T12 DT (SUTURE) ×2 IMPLANT
SUT VIC AB 3-0 SH 27 (SUTURE) ×1
SUT VIC AB 3-0 SH 27X BRD (SUTURE) ×2 IMPLANT
SYR CONTROL 10ML LL (SYRINGE) ×2 IMPLANT
TOWEL GREEN STERILE (TOWEL DISPOSABLE) ×2 IMPLANT
TOWEL GREEN STERILE FF (TOWEL DISPOSABLE) ×2 IMPLANT

## 2022-08-20 NOTE — Anesthesia Procedure Notes (Signed)
Procedure Name: Intubation Date/Time: 08/20/2022 8:40 AM  Performed by: Loleta Kareemah Grounds, CRNAPre-anesthesia Checklist: Patient identified, Patient being monitored, Timeout performed, Emergency Drugs available and Suction available Patient Re-evaluated:Patient Re-evaluated prior to induction Oxygen Delivery Method: Circle system utilized Preoxygenation: Pre-oxygenation with 100% oxygen Induction Type: IV induction Ventilation: Mask ventilation without difficulty Laryngoscope Size: Mac and 3 Grade View: Grade I Tube type: Oral Tube size: 7.0 mm Number of attempts: 1 Airway Equipment and Method: Stylet Placement Confirmation: ETT inserted through vocal cords under direct vision, positive ETCO2 and breath sounds checked- equal and bilateral Secured at: 21 cm Tube secured with: Tape Dental Injury: Teeth and Oropharynx as per pre-operative assessment

## 2022-08-20 NOTE — H&P (Signed)
History of Present Illness: [name] is a 89 y.o. female who is seen today as an office consultation at the request of Dr. Marylou Flesher for evaluation of Umbilical Hernia .   This is a 89 year old female who is a resident of an assisted living facility with a very extensive past medical history who presents with several years of a slowly enlarging umbilical hernia.  The patient usually is in a wheelchair but does ambulate some with a walker.  She had a fall 2 years ago and had some rib fractures.  At that time she had a CT scan that showed a protruding umbilical hernia just above the umbilicus.  She states that this area has become larger and more uncomfortable.  She denies any obstructive symptoms.  She is concerned about the increase in the size of the hernia.  The patient has atrial fibrillation but is no longer on any anticoagulation.  She had a periprosthetic femur fracture earlier in the year and underwent surgery under general anesthesia at [facility].  By all reports, she recovered well.  She is accompanied by a caregiver from her facility.  She does have family in the area that helps her make medical decisions.     Review of Systems: A complete review of systems was obtained from the patient.  I have reviewed this information and discussed as appropriate with the patient.  See HPI as well for other ROS.   Review of Systems  Constitutional: Negative.   HENT: Negative.    Eyes: Negative.   Respiratory: Negative.    Cardiovascular: Negative.   Gastrointestinal: Negative.   Genitourinary: Negative.   Musculoskeletal: Negative.   Skin: Negative.   Neurological: Negative.   Endo/Heme/Allergies: Negative.   Psychiatric/Behavioral: Negative.          Medical History:        Patient Active Problem List  Diagnosis   Aortic atherosclerosis (CMS-HCC)   Cerebral infarction, unspecified (CMS-HCC)   Chronic diastolic CHF (congestive heart failure) (CMS-HCC)   Diabetic polyneuropathy  associated with type 2 diabetes mellitus (CMS-HCC)   Depression, recurrent (CMS-HCC)   DOE (dyspnea on exertion)   Esophageal reflux   Fibromyalgia   History of femur fracture   Hyperglycemia due to diabetes mellitus (CMS-HCC)   History of recurrent UTIs   Hyperlipidemia associated with type 2 diabetes mellitus    Hypertension associated with diabetes    Personal history of malignant neoplasm of breast   Permanent atrial fibrillation (CMS-HCC)   Subarachnoid hemorrhage following injury (CMS-HCC)   Short-term memory loss   Type 2 diabetes mellitus with diabetic neuropathy, unspecified (CMS-HCC)      Past surgical history: Appendectomy, left breast lumpectomy, cholecystectomy, bilateral knee replacements, tonsillectomy,        Allergies  Allergen Reactions   Diltiazem Hcl Itching, Other (See Comments) and Rash      Red itchy rash started a few hours after taking short acting  dilt tabs   Red itchy rash started a few hours after taking short acting  dilt tabs  Red itchy rash started a few hours after taking short acting  dilt tabs  Red itchy rash started a few hours after taking short acting  dilt tabs   Red itchy rash started a few hours after taking short acting  dilt tabs  Red itchy rash started a few hours after taking short acting  dilt tabs   Ace Inhibitors Cough            Current Outpatient  Medications on File Prior to Visit  Medication Sig Dispense Refill   celecoxib (CELEBREX) 100 MG capsule Take by mouth       estradioL (ESTRACE) 0.01 % (0.1 mg/gram) vaginal cream Place vaginally       hydrALAZINE (APRESOLINE) 25 MG tablet Take by mouth       HYDROcodone-acetaminophen (NORCO) 5-325 mg tablet Take by mouth       metFORMIN (GLUCOPHAGE) 500 MG tablet         methocarbamoL (ROBAXIN) 500 MG tablet Take by mouth every 12 (twelve) hours as needed       omeprazole (PRILOSEC) 20 MG DR capsule         ondansetron (ZOFRAN-ODT) 4 MG  disintegrating tablet         potassium chloride (MICRO-K) 10 MEQ ER capsule         rOPINIRole (REQUIP) 0.25 MG tablet Take by mouth       acetaminophen 325 mg Cap Take by mouth every 6 (six) hours as needed       atorvastatin (LIPITOR) 80 MG tablet Take by mouth       cloNIDine HCL (CATAPRES) 0.1 MG tablet Take by mouth       cranberry 400 mg Cap Take 400 mg by mouth 2 (two) times daily       hydroCHLOROthiazide (HYDRODIURIL) 25 MG tablet Take by mouth       magnesium 250 mg Tab Take by mouth        No current facility-administered medications on file prior to visit.           Family History  Problem Relation Age of Onset   High blood pressure (Hypertension) Mother     Stroke Brother        Social History       Tobacco Use  Smoking Status Never  Smokeless Tobacco Never      Social History        Socioeconomic History   Marital status: Widowed  Tobacco Use   Smoking status: Never   Smokeless tobacco: Never  Substance and Sexual Activity   Alcohol use: Not Currently   Drug use: Never      Objective:         Vitals:    03/10/22 0927  BP: (!) 140/90  Pulse: 72  Temp: 36.7 C (98 F)  SpO2: 96%  Height: 149.9 cm ( )    There is no height or weight on file to calculate BMI.   Physical Exam    Constitutional:  WDWN in NAD, conversant, no obvious deformities; lying in bed comfortably Eyes:  Pupils equal, round; sclera anicteric; moist conjunctiva; no lid lag HENT:  Oral mucosa moist; good dentition  Neck:  No masses palpated, trachea midline; no thyromegaly Lungs:  CTA bilaterally; normal respiratory effort CV:  Regular rate and rhythm; no murmurs; extremities well-perfused with no edema Abd:  +bowel sounds, soft, non-tender, no palpable organomegaly; protruding umbilical hernia that is partially reducible.  The hernia sac measures approximately 4 cm in diameter.  The defect is approximately 2 cm. Musc:  Unable to assess gait; wheelchair-bound, no  apparent clubbing or cyanosis in extremities Lymphatic:  No palpable cervical or axillary lymphadenopathy Skin:  Warm, dry; no sign of jaundice Psychiatric - alert and oriented x 4; calm mood and affect       Assessment and Plan:  Diagnoses and all orders for this visit:   Umbilical hernia without obstruction or gangrene  Although the patient has multiple comorbidities and is obviously at slightly higher risk for perioperative complications due to her age and medical comorbidities, the hernia seems to be larger and more symptomatic.  We discussed the risk of incarceration or strangulation of the small bowel.  The patient tolerated urgent surgery well earlier this year.  I recommended that she undergo elective repair of this hernia to prevent incarceration or strangulation.  We will obtain cardiac clearance.  She will discuss this with her family.  She will let us know her decision.   I discussed the procedure with the patient.  Recommend umbilical hernia repair, probably with mesh.   The surgical procedure has been discussed with the patient.  Potential risks, benefits, alternative treatments, and expected outcomes have been explained.  All of the patient's questions at this time have been answered.  The likelihood of reaching the patient's treatment goal is good.     Wilmon Arms. Corliss Skains, MD, [facility] Surgery  General Surgery   08/20/2022 8:22 AM

## 2022-08-20 NOTE — Transfer of Care (Signed)
Immediate Anesthesia Transfer of Care Note  Patient: [name]  Procedure(s) Performed: OPEN UMBILICAL HERNIA REPAIR WITH MESH (Abdomen)  Patient Location: PACU  Anesthesia Type:General  Level of Consciousness: drowsy, patient cooperative, and responds to stimulation  Airway & Oxygen Therapy: Patient Spontanous Breathing  Post-op Assessment: Report given to RN and Post -op Vital signs reviewed and stable  Post vital signs: Reviewed and stable  Last Vitals:  Vitals Value Taken Time  BP 117/49 08/20/22 0939  Temp    Pulse 56 08/20/22 0942  Resp 22 08/20/22 0942  SpO2 94 08/20/22 0942  Vitals shown include unvalidated device data.  Last Pain:  Vitals:   08/20/22 0938  TempSrc:   PainSc: 0-No pain         Complications: No notable events documented.

## 2022-08-20 NOTE — Anesthesia Postprocedure Evaluation (Signed)
Anesthesia Post Note  Patient: [name]  Procedure(s) Performed: OPEN UMBILICAL HERNIA REPAIR WITH MESH (Abdomen)     Patient location during evaluation: PACU Anesthesia Type: General Level of consciousness: awake and alert Pain management: pain level controlled Vital Signs Assessment: post-procedure vital signs reviewed and stable Respiratory status: spontaneous breathing, nonlabored ventilation, respiratory function stable and patient connected to nasal cannula oxygen Cardiovascular status: blood pressure returned to baseline and stable Postop Assessment: no apparent nausea or vomiting Anesthetic complications: no  No notable events documented.  Last Vitals:  Vitals:   08/20/22 1030 08/20/22 1045  BP: (!) 105/50 (!) 109/44  Pulse: (!) 49 (!) 46  Resp: 19 12  Temp:  (!) 36.4 C  SpO2: 98% 98%    Last Pain:  Vitals:   08/20/22 1015  TempSrc:   PainSc: 8                  Adib Wahba,W. EDMOND

## 2022-08-20 NOTE — Op Note (Signed)
Indications:  This is a 89 year old female who is a resident of an assisted living facility with a very extensive past medical history who presents with several years of a slowly enlarging umbilical hernia. The patient usually is in a wheelchair but does ambulate some with a walker. She had a fall 2 years ago and had some rib fractures. At that time she had a CT scan that showed a protruding umbilical hernia just above the umbilicus. She states that this area has become larger and more uncomfortable. She denies any obstructive symptoms. She is concerned about the increase in the size of the hernia. The patient has atrial fibrillation but is no longer on any anticoagulation. She had a periprosthetic femur fracture earlier in the year and underwent surgery under general anesthesia at [facility]. By all reports, she recovered well. She is accompanied by a caregiver from her facility. She does have family in the area that helps her make medical decisions.   Pre-operative diagnosis:  Umbilical hernia (2 cm fascial defect)  Post-operative diagnosis:  Same  Procedure:  Umbilical hernia repair with mesh  Operative Findings - hernia sac containing only omentum; 2 cm fascial defect  Procedure Details  The patient was seen again in the Holding Room. The risks, benefits, complications, treatment options, and expected outcomes were discussed with the patient. The possibilities of reaction to medication, pulmonary aspiration, perforation of viscus, bleeding, recurrent infection, the need for additional procedures, and development of a complication requiring transfusion or further operation were discussed with the patient and/or family. There was concurrence with the proposed plan, and informed consent was obtained. The site of surgery was properly noted/marked. The patient was taken to the Operating Room, identified as [name], and the procedure verified as umbilical hernia repair. A Time Out was held and  the above information confirmed.  After an adequate level of general anesthesia was obtained, the patient's abdomen was prepped with Chloraprep and draped in sterile fashion.  We made a transverse incision above the umbilicus.  Dissection was carried down to the hernia sac with cautery.  We dissected bluntly around the hernia sac down to the edge of the fascial defect.  We reduced the hernia sac back into the pre-peritoneal space.  The fascial defect measured 2 cm.  We cleared the fascia in all directions.  A small Ventralex mesh was inserted into the pre-peritoneal space and was deployed.  The mesh was secured with four trans-fascial sutures of 0 Novofil.  The fascial defect was closed with multiple interrupted figure-of-eight 1 Novofil sutures.  The base of the umbilicus was tacked down with 3-0 Vicryl.  3-0 Vicryl was used to close the subcutaneous tissues and 4-0 Monocryl was used to close the skin.  Steri-strips and clean dressing were applied.  The patient was extubated and brought to the recovery room in stable condition.  All sponge, instrument, and needle counts were correct prior to closure and at the conclusion of the case.   Estimated Blood Loss: Minimal          Complications: None; patient tolerated the procedure well.         Disposition: PACU - hemodynamically stable.         Condition: stable  [name]. Joan Skains, MD, [facility] Surgery  General Surgery   08/20/2022 9:22 AM

## 2022-08-21 ENCOUNTER — Encounter (HOSPITAL_COMMUNITY): Payer: Self-pay | Admitting: Surgery

## 2022-08-21 DIAGNOSIS — K429 Umbilical hernia without obstruction or gangrene: Secondary | ICD-10-CM | POA: Diagnosis not present

## 2022-08-21 LAB — GLUCOSE, CAPILLARY
Glucose-Capillary: 129 mg/dL — ABNORMAL HIGH (ref 70–99)
Glucose-Capillary: 124 mg/dL — ABNORMAL HIGH (ref 70–99)

## 2022-08-21 LAB — SURGICAL PATHOLOGY

## 2022-08-21 MED ORDER — HYDROCODONE-ACETAMINOPHEN 5-325 MG PO TABS: 1.0000 | ORAL_TABLET | Freq: Four times a day (QID) | ORAL | 0 refills | Status: AC | PRN

## 2022-08-21 MED ORDER — HYDROCODONE-ACETAMINOPHEN 5-325 MG PO TABS: 1.0000 | ORAL_TABLET | Freq: Four times a day (QID) | ORAL | 0 refills | Status: DC | PRN

## 2022-08-21 NOTE — Discharge Summary (Signed)
Physician Discharge Summary  Patient ID: [name] MRN: [id number] DOB/AGE: 1925-12-18 89 y.o.  Admit date: 08/20/2022 Discharge date: 08/21/2022  Admission Diagnoses:  Umbilical hernia  Discharge Diagnoses: Umbilical hernia Principal Problem:   Umbilical hernia   Discharged Condition: good  Hospital Course: Open repair of umbilical hernia with mesh 08/20/22.  She was kept overnight for observation due to her multiple medical issues.  On the morning of POD #1, she was still requiring oxygen.  She used an incentive spirometer several times and was weaned off oxygen with stable saturations.  Pain controlled with Hydrocodone.  Consults: None  Treatments: surgery: umbilical hernia repair with mesh  Discharge Exam: Blood pressure (!) 142/59, pulse (!) 46, temperature 98.1 F (36.7 C), resp. rate 16, height 5' (1.524 m), weight 60.6 kg, SpO2 96 %. General appearance: alert, cooperative, and no distress GI: tender around umbilicus Incision c/d/i  Disposition: Discharge disposition: 01-Home or Self Care       Discharge Instructions     Call MD for:  persistant nausea and vomiting   Complete by: As directed    Call MD for:  redness, tenderness, or signs of infection (pain, swelling, redness, odor or green/yellow discharge around incision site)   Complete by: As directed    Call MD for:  severe uncontrolled pain   Complete by: As directed    Call MD for:  temperature >100.4   Complete by: As directed    Diet general   Complete by: As directed    Driving Restrictions   Complete by: As directed    Do not drive while taking pain medications   Increase activity slowly   Complete by: As directed    May shower / Bathe   Complete by: As directed       Allergies as of 08/21/2022       Reactions   Diltiazem Hcl Itching, Rash, Other (See Comments)   Red itchy rash started a few hours after taking short acting  dilt tabs   Duloxetine Hcl Anxiety, Rash, Other (See  Comments)   Makes patient feel "restless and scared" and unable to sleep   Ace Inhibitors Cough        Medication List     TAKE these medications    acetaminophen 325 MG tablet Commonly known as: TYLENOL Take 650 mg by mouth every 6 (six) hours as needed (pain).   atorvastatin 40 MG tablet Commonly known as: LIPITOR Take 1 tablet (40 mg total) by mouth daily. What changed: when to take this   cloNIDine 0.1 MG tablet Commonly known as: CATAPRES Take 0.1 mg by mouth 2 (two) times daily.   D-Mannose 500 MG Caps Take 2,000 mg by mouth daily.   dextrose 50 % solution Inject 1 ampule into the vein as needed for low blood sugar.   estradiol 0.1 MG/GM vaginal cream Commonly known as: ESTRACE Place 1 Applicatorful vaginally See admin instructions. Apply 0.5g at bedtime on Mondays and Thursdays only.   Glucagon 1 MG/0.2ML Soln Inject 1 mg into the skin as needed (hypoglycemia).   Glucose 15 g Pack Take 15 g by mouth 2 (two) times daily as needed (hypoglycemia).   guaiFENesin-dextromethorphan 100-10 MG/5ML syrup Commonly known as: ROBITUSSIN DM Take 15 mLs by mouth every 4 (four) hours as needed for cough.   hydrALAZINE 25 MG tablet Commonly known as: APRESOLINE Take 1 tablet (25 mg total) by mouth 3 (three) times daily.   hydrochlorothiazide 25 MG tablet Commonly known as:  HYDRODIURIL Take 50 mg by mouth daily.   HYDROcodone-acetaminophen 5-325 MG tablet Commonly known as: NORCO/VICODIN Take 1 tablet by mouth See admin instructions. Take 1 tablet every morning. May take 1 tablet every 8 hours as needed for pain. What changed: Another medication with the same name was added. Make sure you understand how and when to take each.   HYDROcodone-acetaminophen 5-325 MG tablet Commonly known as: NORCO/VICODIN Take 1 tablet by mouth every 6 (six) hours as needed for moderate pain. What changed: You were already taking a medication with the same name, and this prescription was  added. Make sure you understand how and when to take each.   Levemir FlexTouch 100 UNIT/ML FlexPen Generic drug: insulin detemir Inject 6-9 Units into the skin See admin instructions. Inject 9 units in the morning and 6 units at bedtime.   loperamide 2 MG capsule Commonly known as: IMODIUM Take 2 mg by mouth as needed for diarrhea or loose stools (up to 8 doses in 24 hours).   Magnesium 250 MG Tabs Take 250 mg by mouth every evening.   melatonin 5 MG Tabs Take 5 mg by mouth at bedtime.   metFORMIN 500 MG tablet Commonly known as: GLUCOPHAGE Take 1 tablet (500 mg total) by mouth daily with breakfast.   mirtazapine 30 MG tablet Commonly known as: REMERON Take 1 tablet by mouth at bedtime.   Myrbetriq 25 MG Tb24 tablet Generic drug: mirabegron ER Take 25 mg by mouth daily.   NovoLOG FlexPen 100 UNIT/ML FlexPen Generic drug: insulin aspart Inject 0-10 Units into the skin See admin instructions. Sliding scale: CBG 1 to 150=0 units, 151-200=2 units, 201-250=4 units, 251-300=6 units, 301-350=8 units, 351-400=10 units >400 call Dr < 70 call Dr BID   ondansetron 4 MG tablet Commonly known as: ZOFRAN Take 1 tablet (4 mg total) by mouth every 8 (eight) hours as needed for nausea or vomiting. What changed: when to take this   polyethylene glycol 17 g packet Commonly known as: MIRALAX / GLYCOLAX Take 17 g by mouth daily as needed (constipation).   potassium chloride 10 MEQ tablet Commonly known as: KLOR-CON M Take 40 mEq by mouth daily.   Prevagen Extra Strength 20 MG Caps Generic drug: Apoaequorin Take 20 mg by mouth daily.   rOPINIRole 0.25 MG tablet Commonly known as: REQUIP Take 0.25 mg by mouth 3 (three) times daily.   sertraline 100 MG tablet Commonly known as: ZOLOFT Take 100 mg by mouth daily.   Vitamin D3 50 MCG (2000 UT) Tabs Take 2,000 Units by mouth in the morning.         Signed: Wilmon Arms Doniven Vanpatten 08/21/2022, 2:07 PM

## 2022-08-21 NOTE — [place] FL2 (Signed)
[place] MEDICAID FL2 LEVEL OF CARE FORM     IDENTIFICATION  Patient Name: [name] Birthdate: 1925-12-29 Sex: female Admission Date (Current Location): 08/20/2022  [facility] and [place] Number:  Producer, television/film/video and Address:  [facility] [place], [address], [place], [place] [address]      Provider Number: [id number]  Attending Physician Name and Address:  Manus Rudd, MD  Relative Name and Phone Number:  Tyann, Niehaus [phone])  [phone] [facility])    Current Level of Care: Hospital Recommended Level of Care: Skilled Nursing Facility Prior Approval Number:    Date Approved/Denied:   PASRR Number:    Discharge Plan: SNF    Current Diagnoses: Patient Active Problem List   Diagnosis Date Noted   Umbilical hernia 08/20/2022   Umbilical hernia without obstruction and without gangrene 07/31/2022   Acute bronchitis due to Rhinovirus 05/13/2022   Hypomagnesemia 05/13/2022   At risk for aspiration pneumonia due to patulous esophagus 05/13/2022   Palliative care encounter 05/13/2022   Acute respiratory failure with hypoxia 05/08/2022   Acute postoperative pain of left knee 09/16/2021   Encounter to establish care 02/20/2020   Bilateral subdural hematomas 07/16/2019   CHI (closed head injury), initial encounter 07/14/2019   Left radial head fracture 07/14/2019   SDH (subdural hematoma)    Hypertensive urgency    Subarachnoid hemorrhage following injury 07/13/2019   Acute encephalopathy 06/27/2019   Heat stroke 06/27/2019   Hypokalemia 06/27/2019   Fever, unspecified 06/27/2019   Stroke 06/27/2019   Altered mental status 06/26/2019   Right hip pain 06/22/2019   Chronic left hip pain 05/25/2019   Depression, recurrent 05/25/2019   Chronic anticoagulation 04/19/2019   Leg cramps 03/04/2019   Short-term memory loss 03/04/2019   DOE (dyspnea on exertion) 03/04/2019   Aortic atherosclerosis 03/04/2019   Physical deconditioning  01/24/2019   History of recurrent UTIs 12/03/2018   Chronic pain of right knee 10/08/2018   Type 2 diabetes mellitus with diabetic neuropathy, unspecified 08/12/2018   Pulmonary vascular congestion    Chronic diastolic CHF (congestive heart failure)    Acute hypoxemic respiratory failure 12/06/2017   Permanent atrial fibrillation 12/06/2017   Trochanteric bursitis, right hip 07/16/2017   Other intervertebral disc degeneration, lumbar region 07/16/2017   Osteoporosis 11/04/2016   Diabetic polyneuropathy associated with type 2 diabetes mellitus 11/14/2014   Fibromyalgia 02/11/2013   CVA (cerebral vascular accident) 08/01/2008   Osteoarthritis 04/09/2007   Hyperlipidemia associated with type 2 diabetes mellitus 03/08/2007   Hypertension associated with diabetes 03/08/2007   Pulmonary fibrosis 03/08/2007   GERD 03/08/2007   BREAST CANCER, HX OF 03/08/2007    Orientation RESPIRATION BLADDER Height & Weight     Self, Time, Situation, Place  Normal Incontinent, External catheter Weight: 133 lb 9.6 oz (60.6 kg) Height:  5' (152.4 cm)  BEHAVIORAL SYMPTOMS/MOOD NEUROLOGICAL BOWEL NUTRITION STATUS      Continent Diet (see d/c summary)  AMBULATORY STATUS COMMUNICATION OF NEEDS Skin   Extensive Assist Verbally Surgical wounds (incision umbilicus)                       Personal Care Assistance Level of Assistance  Bathing, Feeding, Dressing Bathing Assistance: Limited assistance Feeding assistance: Independent Dressing Assistance: Limited assistance     Functional Limitations Info  Sight, Hearing, Speech Sight Info: Adequate Hearing Info: Adequate Speech Info: Adequate    SPECIAL CARE FACTORS FREQUENCY  Contractures Contractures Info: Not present    Additional Factors Info  Code Status, Allergies Code Status Info: Full code Allergies Info: Diltiazem Hcl, ace inhibitors, Duloxetine Hcl           Current Medications (08/21/2022):  This is  the current hospital active medication list Current Facility-Administered Medications  Medication Dose Route Frequency Provider Last Rate Last Admin   0.9 %  sodium chloride infusion   Intravenous Continuous Manus Rudd, MD 50 mL/hr at 08/20/22 2244 New Bag at 08/20/22 2244   acetaminophen (TYLENOL) tablet 650 mg  650 mg Oral Q6H PRN Manus Rudd, MD       atorvastatin (LIPITOR) tablet 40 mg  40 mg Oral QPM Manus Rudd, MD   40 mg at 08/20/22 1709   cloNIDine (CATAPRES) tablet 0.1 mg  0.1 mg Oral BID Manus Rudd, MD   0.1 mg at 08/21/22 1610   diphenhydrAMINE (BENADRYL) 12.5 MG/5ML elixir 12.5 mg  12.5 mg Oral Q6H PRN Manus Rudd, MD       Or   diphenhydrAMINE (BENADRYL) injection 12.5 mg  12.5 mg Intravenous Q6H PRN Manus Rudd, MD       enoxaparin (LOVENOX) injection 40 mg  40 mg Subcutaneous Q24H Manus Rudd, MD   40 mg at 08/21/22 0848   guaiFENesin-dextromethorphan (ROBITUSSIN DM) 100-10 MG/5ML syrup 15 mL  15 mL Oral Q4H PRN Manus Rudd, MD       hydrALAZINE (APRESOLINE) tablet 25 mg  25 mg Oral TID Manus Rudd, MD   25 mg at 08/21/22 9604   hydrochlorothiazide (HYDRODIURIL) tablet 50 mg  50 mg Oral Daily Manus Rudd, MD   50 mg at 08/21/22 5409   HYDROcodone-acetaminophen (NORCO/VICODIN) 5-325 MG per tablet 1-2 tablet  1-2 tablet Oral Q4H PRN Manus Rudd, MD   1 tablet at 08/21/22 0902   insulin aspart (novoLOG) injection 0-9 Units  0-9 Units Subcutaneous TID WC Manus Rudd, MD   1 Units at 08/21/22 1250   magnesium oxide (MAG-OX) tablet 200 mg  200 mg Oral Daily Pham, Minh Q, RPH-CPP   200 mg at 08/21/22 8119   melatonin tablet 5 mg  5 mg Oral QHS Manus Rudd, MD   5 mg at 08/20/22 2226   metFORMIN (GLUCOPHAGE) tablet 500 mg  500 mg Oral Q breakfast Manus Rudd, MD   500 mg at 08/21/22 0848   mirabegron ER (MYRBETRIQ) tablet 25 mg  25 mg Oral Daily Manus Rudd, MD   25 mg at 08/21/22 0902   mirtazapine (REMERON) tablet 30 mg  30 mg Oral QHS  Manus Rudd, MD   30 mg at 08/20/22 2226   morphine (PF) 2 MG/ML injection 2 mg  2 mg Intravenous Q2H PRN Manus Rudd, MD       ondansetron [facility]) tablet 4 mg  4 mg Oral Q6H PRN Manus Rudd, MD   4 mg at 08/20/22 2242   potassium chloride SA (KLOR-CON M) CR tablet 40 mEq  40 mEq Oral Daily Manus Rudd, MD   40 mEq at 08/21/22 0901   rOPINIRole (REQUIP) tablet 0.25 mg  0.25 mg Oral TID Manus Rudd, MD   0.25 mg at 08/21/22 0902   sertraline (ZOLOFT) tablet 100 mg  100 mg Oral Daily Manus Rudd, MD   100 mg at 08/21/22 0902   traMADol (ULTRAM) tablet 50 mg  50 mg Oral Q6H PRN Manus Rudd, MD         Discharge Medications: Please see discharge summary for a list of  discharge medications.  Relevant Imaging Results:  Relevant Lab Results:   Additional Information SSN: [id number] [place], [place]

## 2022-08-21 NOTE — Discharge Instructions (Signed)
[facility] _______[facility]  UMBILICAL HERNIA REPAIR: POST OP INSTRUCTIONS  Always review your discharge instruction sheet given to you by the facility where your surgery was performed. IF YOU HAVE DISABILITY OR FAMILY LEAVE FORMS, YOU MUST BRING THEM TO THE OFFICE FOR PROCESSING.   DO NOT GIVE THEM TO YOUR DOCTOR.  1. A  prescription for pain medication may be given to you upon discharge.  Take your pain medication as prescribed, if needed.  If narcotic pain medicine is not needed, then you may take acetaminophen (Tylenol) or ibuprofen (Advil) as needed. 2. Take your usually prescribed medications unless otherwise directed. If you need a refill on your pain medication, please contact your pharmacy.  They will contact our office to request authorization. Prescriptions will not be filled after 5 pm or on week-ends. 3. You should follow a light diet the first 24 hours after arrival home, such as soup and crackers, etc.  Be sure to include lots of fluids daily.  Resume your normal diet the day after surgery. 4.Most patients will experience some swelling and bruising around the umbilicus.  Ice packs and reclining will help.  Swelling and bruising can take several days to resolve.  6. It is common to experience some constipation if taking pain medication after surgery.  Increasing fluid intake and taking a stool softener (such as Colace) will usually help or prevent this problem from occurring.  A mild laxative (Milk of Magnesia or Miralax) should be taken according to package directions if there are no bowel movements after 48 hours. 7. Unless discharge instructions indicate otherwise, you may remove your bandages 24-48 hours after surgery, and you may shower at that time.  You may have steri-strips (small skin tapes) in place directly over the incision.  These strips should be left on the skin for 7-10 days.   8. ACTIVITIES:  You may resume regular (light) daily activities beginning the next  day--such as daily self-care, walking, climbing stairs--gradually increasing activities as tolerated.  Refrain from any heavy lifting or straining until approved by your doctor.  9.You should see your doctor in the office for a follow-up appointment approximately 2-3 weeks after your surgery.  Make sure that you call for this appointment within a day or two after you arrive home to insure a convenient appointment time. 10.OTHER INSTRUCTIONS: _________________________    _____________________________________  WHEN TO CALL YOUR DOCTOR: Fever over 101.0 Inability to urinate Nausea and/or vomiting Extreme swelling or bruising Continued bleeding from incision. Increased pain, redness, or drainage from the incision  The clinic staff is available to answer your questions during regular business hours.  Please don't hesitate to call and ask to speak to one of the nurses for clinical concerns.  If you have a medical emergency, go to the nearest emergency room or call 911.  A surgeon from [facility] is always on call at the hospital   [address], [place], [place]  [address] ?  [address] [address], [place], [place]   [address] [phone] ? [phone] ? FAX [phone] Web site: [email or web address]

## 2022-08-21 NOTE — Progress Notes (Signed)
1 Day Post-Op   Subjective/Chief Complaint: Sore, requiring narcotic pain medication (PO) Still requiring 2L [place] oxygen   Objective: Vital signs in last 24 hours: Temp:  [97.5 F (36.4 C)-98.3 F (36.8 C)] 98.1 F (36.7 C) (04/18 0738) Pulse Rate:  [46-56] 46 (04/18 0738) Resp:  [12-19] 16 (04/18 0346) BP: (104-156)/(42-61) 142/59 (04/18 0738) SpO2:  [91 %-100 %] 96 % (04/18 0738) Last BM Date : 08/19/22  Intake/Output from previous day: 04/17 0701 - 04/18 0700 In: 1031.8 [I.V.:931.8; IV Piggyback:100] Out: 10 [Blood:10] Intake/Output this shift: No intake/output data recorded.  WDWN in NAD Abd - incisional tenderness Incision c/d/i    Anti-infectives: Anti-infectives (From admission, onward)    Start     Dose/Rate Route Frequency Ordered Stop   08/20/22 0700  ceFAZolin (ANCEF) IVPB 2g/100 mL premix        2 g 200 mL/hr over 30 Minutes Intravenous On call to O.R. 08/20/22 0648 08/20/22 0913       Assessment/Plan: s/p Procedure(s): OPEN UMBILICAL HERNIA REPAIR WITH MESH (N/A) Incentive spirometer Mobilize Wean off oxygen Hopefully discharge later today Discussed with son at bedside  LOS: 0 days    Wynona Luna 08/21/2022

## 2022-08-21 NOTE — Care Management (Signed)
Patient is from [facility] [place]. The patient has her wheelchair at bedside. [employer] officer called for [employer]. They will call back with a ETA. CSW, RN aware.

## 2022-08-21 NOTE — Care Management (Deleted)
Family requesting transportation to assisted Living , called [facility]

## 2022-08-21 NOTE — Care Management (Signed)
Screening note The Transition of Care Department (TOC) has reviewed patient and no TOC needs have been identified at this time. We will continue to monitor patient advancement through interdisciplinary progression rounds. If new patient transition needs arise, please place a TOC consult  

## 2022-08-21 NOTE — Plan of Care (Signed)

## 2022-08-22 DIAGNOSIS — R41841 Cognitive communication deficit: Secondary | ICD-10-CM | POA: Diagnosis not present

## 2022-08-22 DIAGNOSIS — Z9181 History of falling: Secondary | ICD-10-CM | POA: Diagnosis not present

## 2022-08-22 DIAGNOSIS — M6281 Muscle weakness (generalized): Secondary | ICD-10-CM | POA: Diagnosis not present

## 2022-08-25 DIAGNOSIS — F32A Depression, unspecified: Secondary | ICD-10-CM | POA: Diagnosis not present

## 2022-08-25 DIAGNOSIS — K5900 Constipation, unspecified: Secondary | ICD-10-CM | POA: Diagnosis not present

## 2022-08-25 DIAGNOSIS — N189 Chronic kidney disease, unspecified: Secondary | ICD-10-CM | POA: Diagnosis not present

## 2022-08-25 DIAGNOSIS — N3281 Overactive bladder: Secondary | ICD-10-CM | POA: Diagnosis not present

## 2022-08-25 DIAGNOSIS — F419 Anxiety disorder, unspecified: Secondary | ICD-10-CM | POA: Diagnosis not present

## 2022-08-25 DIAGNOSIS — F339 Major depressive disorder, recurrent, unspecified: Secondary | ICD-10-CM | POA: Diagnosis not present

## 2022-08-25 DIAGNOSIS — G8929 Other chronic pain: Secondary | ICD-10-CM | POA: Diagnosis not present

## 2022-08-25 DIAGNOSIS — I4891 Unspecified atrial fibrillation: Secondary | ICD-10-CM | POA: Diagnosis not present

## 2022-08-25 DIAGNOSIS — I639 Cerebral infarction, unspecified: Secondary | ICD-10-CM | POA: Diagnosis not present

## 2022-08-25 DIAGNOSIS — E559 Vitamin D deficiency, unspecified: Secondary | ICD-10-CM | POA: Diagnosis not present

## 2022-08-25 DIAGNOSIS — E119 Type 2 diabetes mellitus without complications: Secondary | ICD-10-CM | POA: Diagnosis not present

## 2022-08-25 DIAGNOSIS — E1142 Type 2 diabetes mellitus with diabetic polyneuropathy: Secondary | ICD-10-CM | POA: Diagnosis not present

## 2022-08-25 DIAGNOSIS — F329 Major depressive disorder, single episode, unspecified: Secondary | ICD-10-CM | POA: Diagnosis not present

## 2022-08-25 DIAGNOSIS — M1990 Unspecified osteoarthritis, unspecified site: Secondary | ICD-10-CM | POA: Diagnosis not present

## 2022-08-25 DIAGNOSIS — K219 Gastro-esophageal reflux disease without esophagitis: Secondary | ICD-10-CM | POA: Diagnosis not present

## 2022-08-25 DIAGNOSIS — M797 Fibromyalgia: Secondary | ICD-10-CM | POA: Diagnosis not present

## 2022-08-25 DIAGNOSIS — I739 Peripheral vascular disease, unspecified: Secondary | ICD-10-CM | POA: Diagnosis not present

## 2022-08-25 DIAGNOSIS — I10 Essential (primary) hypertension: Secondary | ICD-10-CM | POA: Diagnosis not present

## 2022-08-25 DIAGNOSIS — R5381 Other malaise: Secondary | ICD-10-CM | POA: Diagnosis not present

## 2022-09-02 ENCOUNTER — Non-Acute Institutional Stay: Payer: PPO | Admitting: Family Medicine

## 2022-09-02 ENCOUNTER — Encounter: Payer: Self-pay | Admitting: Family Medicine

## 2022-09-02 VITALS — BP 133/64 | HR 90 | Temp 98.0°F | Resp 18

## 2022-09-02 DIAGNOSIS — Z9889 Other specified postprocedural states: Secondary | ICD-10-CM | POA: Diagnosis not present

## 2022-09-02 DIAGNOSIS — R0600 Dyspnea, unspecified: Secondary | ICD-10-CM | POA: Insufficient documentation

## 2022-09-02 DIAGNOSIS — E1140 Type 2 diabetes mellitus with diabetic neuropathy, unspecified: Secondary | ICD-10-CM | POA: Diagnosis not present

## 2022-09-02 DIAGNOSIS — Z8719 Personal history of other diseases of the digestive system: Secondary | ICD-10-CM | POA: Insufficient documentation

## 2022-09-02 DIAGNOSIS — E1159 Type 2 diabetes mellitus with other circulatory complications: Secondary | ICD-10-CM | POA: Diagnosis not present

## 2022-09-02 DIAGNOSIS — B351 Tinea unguium: Secondary | ICD-10-CM | POA: Diagnosis not present

## 2022-09-02 DIAGNOSIS — Z794 Long term (current) use of insulin: Secondary | ICD-10-CM | POA: Diagnosis not present

## 2022-09-02 NOTE — Progress Notes (Signed)
Therapist, nutritional Palliative Care Consult Note Telephone: [phone]  Fax: [phone]   Date of encounter: 09/02/22 12:13 PM PATIENT NAME: [name]   [phone] (home)  DOB: 1926/03/19 MRN: [id number] PRIMARY CARE PROVIDER:    Sherron Monday, MD,  [address] [place] [address] [phone]  REFERRING PROVIDER:   Sherron Monday, MD [address] [place],  [place] [address] [phone]  Health Care Agent/Health Care Power of Attorney:    Contact Information     Name Relation Home Work Mobile   [name] Son [phone]  [phone]   [name], [name]   [phone]        I met face to face with patient in [facility]. Palliative Care was asked to follow this patient by consultation request of Sherron Monday, MD to address advance care planning and complex medical decision making. This is a follow up visit.    CODE STATUS: MOST as of 12/11/2019: DNR/DNI Limited intervention IV fluid and antibiotics if needed No feeding tube    ASSESSMENT AND / RECOMMENDATIONS:  PPS: 50% Hx of umbilical hernia repair Educated if develops fever, pain, erythema, nausea or vomiting or constipation to alert staff. Encouraged to avoid straining.  Acute dyspnea O2 sat 92% on room air Rales in BLL, question possible atelectasis post op. Encourage cough and deep breathing, mobilization with DME/PT to improve lung expan   Type 2 DM with long term insulin use Good control currently, recommend HS snack if one is not eaten to avoid fasting lows, goal 90-130. Continue Lantus 6 units QHS, 9 units in am and SSI with meals. Metformin 500 mg daily.   Follow up Palliative Care Visit:  Palliative Care continuing to follow up by monitoring for changes in appetite, weight, functional and cognitive status for chronic disease progression and  management in agreement with patient's stated goals of care. Next visit in 4 weeks or prn.  This visit was coded based on medical decision making (MDM).  Chief Complaint  Palliative Care is following pt for chronic medical management in setting of return to assisted living post op following umbilical hernia repair.  HISTORY OF PRESENT ILLNESS: [name] is a 89 y.o. year old female with chronic diastolic heart failure, hx of CVA and atrial fibrillation with no anticoagulation due to age/renal function and fall risk along with hx of bilateral SDH.  Denies post op pain. States food is not good.  Denies recent falls. Denies nausea and vomiting, constipation.  Has some fever today and burning at end of urination.  Had umbilical hernia repair done about 10 days ago without complications.  Blood sugars showing good control without hypoglycemia-range 75-152.     ACTIVITIES OF DAILY LIVING: CONTINENT OF BLADDER/BOWEL? Yes BATHING/DRESSING/FEEDING-Independent with feeding, requires some assistance with bathing and dressing  MOBILITY:   AMBULATORY WITH ASSISTIVE DEVICE: WHEELCHAIR-can transfer to and from wc  APPETITE? Fair, "lousy food" Likes fruit WEIGHT as of 08/09/22 was 134.4 lbs, on 05/14/22 was 136.4 lbs  CURRENT PROBLEM LIST:  Patient Active Problem List   Diagnosis Date Noted   Umbilical hernia 08/20/2022   Umbilical hernia without obstruction and without gangrene 07/31/2022   Hypomagnesemia 05/13/2022   At risk for aspiration pneumonia due to patulous esophagus 05/13/2022   Palliative care encounter 05/13/2022   Hypertensive urgency    Subarachnoid hemorrhage following injury (HCC) 07/13/2019   Hypokalemia 06/27/2019  Stroke (HCC) 06/27/2019   Right hip pain 06/22/2019   Chronic left hip pain 05/25/2019   Depression, recurrent (HCC) 05/25/2019   Chronic anticoagulation 04/19/2019   Leg cramps 03/04/2019   Short-term memory loss 03/04/2019   DOE (dyspnea on exertion)  03/04/2019   Aortic atherosclerosis (HCC) 03/04/2019   Physical deconditioning 01/24/2019   History of recurrent UTIs 12/03/2018   Chronic pain of right knee 10/08/2018   Type 2 diabetes mellitus with diabetic neuropathy, unspecified (HCC) 08/12/2018   Pulmonary vascular congestion    Chronic diastolic CHF (congestive heart failure) (HCC)    Permanent atrial fibrillation (HCC) 12/06/2017   Trochanteric bursitis, right hip 07/16/2017   Other intervertebral disc degeneration, lumbar region 07/16/2017   Osteoporosis 11/04/2016   Diabetic polyneuropathy associated with type 2 diabetes mellitus (HCC) 11/14/2014   Fibromyalgia 02/11/2013   CVA (cerebral vascular accident) (HCC) 08/01/2008   Osteoarthritis 04/09/2007   Hyperlipidemia associated with type 2 diabetes mellitus (HCC) 03/08/2007   Hypertension associated with diabetes (HCC) 03/08/2007   Pulmonary fibrosis (HCC) 03/08/2007   GERD 03/08/2007   BREAST CANCER, HX OF 03/08/2007   PAST MEDICAL HISTORY:  Active Ambulatory Problems    Diagnosis Date Noted   Hyperlipidemia associated with type 2 diabetes mellitus (HCC) 03/08/2007   Hypertension associated with diabetes (HCC) 03/08/2007   CVA (cerebral vascular accident) (HCC) 08/01/2008   Pulmonary fibrosis (HCC) 03/08/2007   GERD 03/08/2007   Osteoarthritis 04/09/2007   BREAST CANCER, HX OF 03/08/2007   Fibromyalgia 02/11/2013   Diabetic polyneuropathy associated with type 2 diabetes mellitus (HCC) 11/14/2014   Osteoporosis 11/04/2016   Trochanteric bursitis, right hip 07/16/2017   Other intervertebral disc degeneration, lumbar region 07/16/2017   Permanent atrial fibrillation (HCC) 12/06/2017   Pulmonary vascular congestion    Chronic diastolic CHF (congestive heart failure) (HCC)    Type 2 diabetes mellitus with diabetic neuropathy, unspecified (HCC) 08/12/2018   Chronic pain of right knee 10/08/2018   History of recurrent UTIs 12/03/2018   Physical deconditioning 01/24/2019    Leg cramps 03/04/2019   Short-term memory loss 03/04/2019   DOE (dyspnea on exertion) 03/04/2019   Aortic atherosclerosis (HCC) 03/04/2019   Chronic anticoagulation 04/19/2019   Chronic left hip pain 05/25/2019   Depression, recurrent (HCC) 05/25/2019   Right hip pain 06/22/2019   Hypokalemia 06/27/2019   Stroke (HCC) 06/27/2019   Subarachnoid hemorrhage following injury (HCC) 07/13/2019   Hypertensive urgency    Hypomagnesemia 05/13/2022   At risk for aspiration pneumonia due to patulous esophagus 05/13/2022   Palliative care encounter 05/13/2022   Umbilical hernia without obstruction and without gangrene 07/31/2022   Umbilical hernia 08/20/2022   Resolved Ambulatory Problems    Diagnosis Date Noted   Diabetes (HCC) 08/01/2008   DVT 08/01/2008   Urinary tract infection 08/02/2012   Frequency 08/02/2012   DM (diabetes mellitus) (HCC) 08/02/2012   HTN (hypertension) 08/02/2012   HLD (hyperlipidemia) 08/02/2012   Frequency of urination 11/09/2012   Arthritis 11/09/2012   HLD (hyperlipidemia) 02/11/2013   Need for prophylactic vaccination and inoculation against influenza 02/11/2013   Grieving 07/14/2013   Neck pain    Frequent UTI    Shortness of breath 09/24/2015   Acute hypoxemic respiratory failure (HCC) 12/06/2017   Nausea 03/01/2018   Altered mental status 06/26/2019   Acute encephalopathy 06/27/2019   Heat stroke 06/27/2019   Fever, unspecified 06/27/2019   CHI (closed head injury), initial encounter 07/14/2019   Left radial head fracture 07/14/2019   SDH (subdural hematoma) (  HCC)    Bilateral subdural hematomas (HCC) 07/16/2019   Encounter to establish care 02/20/2020   Acute postoperative pain of left knee 09/16/2021   Acute respiratory failure with hypoxia (HCC) 05/08/2022   Acute bronchitis due to Rhinovirus 05/13/2022   Abnormal weight loss 05/13/2022   Past Medical History:  Diagnosis Date   Atrial fibrillation (HCC)    Cancer (HCC)    Cataract     Diabetes mellitus without complication (HCC)    Hyperlipidemia    Hypertension    Pneumonia    Scoliosis    SOCIAL HX:  Social History   Tobacco Use   Smoking status: Never   Smokeless tobacco: Never  Substance Use Topics   Alcohol use: No    Alcohol/week: 0.0 standard drinks of alcohol   FAMILY HX:  Family History  Problem Relation Age of Onset   Cancer Mother        lung   Arthritis Mother    Heart disease Father        endocarditis   Cancer Brother        lung, throat   Alcohol abuse Brother        Preferred Pharmacy: ALLERGIES:  Allergies  Allergen Reactions   Diltiazem Hcl Itching, Rash and Other (See Comments)    Red itchy rash started a few hours after taking short acting 120mg  dilt tabs    Duloxetine Hcl Anxiety, Rash and Other (See Comments)    Makes patient feel "restless and scared" and unable to sleep   Ace Inhibitors Cough     PERTINENT MEDICATIONS:  Outpatient Encounter Medications as of 09/02/2022  Medication Sig   acetaminophen (TYLENOL) 325 MG tablet Take 650 mg by mouth every 6 (six) hours as needed (pain).   Apoaequorin (PREVAGEN EXTRA STRENGTH) 20 MG CAPS Take 20 mg by mouth daily.   atorvastatin (LIPITOR) 40 MG tablet Take 1 tablet (40 mg total) by mouth daily. (Patient taking differently: Take 40 mg by mouth every evening.)   Cholecalciferol (VITAMIN D3) 50 MCG (2000 UT) TABS Take 2,000 Units by mouth in the morning.   cloNIDine (CATAPRES) 0.1 MG tablet Take 0.1 mg by mouth 2 (two) times daily.   D-Mannose 500 MG CAPS Take 2,000 mg by mouth daily.   estradiol (ESTRACE) 0.1 MG/GM vaginal cream Place 1 Applicatorful vaginally See admin instructions. Apply 0.5g at bedtime on Mondays and Thursdays only.   hydrALAZINE (APRESOLINE) 25 MG tablet Take 1 tablet (25 mg total) by mouth 3 (three) times daily. (Patient taking differently: Take 25 mg by mouth 4 (four) times daily. Hold for SBP < 130)   hydrochlorothiazide (HYDRODIURIL) 25 MG tablet Take 50  mg by mouth daily.   HYDROcodone-acetaminophen (NORCO/VICODIN) 5-325 MG tablet Take 1 tablet by mouth every 6 (six) hours as needed for moderate pain. (Patient taking differently: Take 1 tablet by mouth every 8 (eight) hours as needed for moderate pain. And once in am)   LEVEMIR FLEXTOUCH 100 UNIT/ML FlexTouch Pen Inject 6-9 Units into the skin See admin instructions. Inject 9 units in the morning and 6 units at bedtime.   loperamide (IMODIUM) 2 MG capsule Take 2 mg by mouth as needed for diarrhea or loose stools (up to 8 doses in 24 hours).   Magnesium 250 MG TABS Take 250 mg by mouth every evening.   melatonin 5 MG TABS Take 5 mg by mouth at bedtime.   metFORMIN (GLUCOPHAGE) 500 MG tablet Take 1 tablet (500 mg total) by mouth  daily with breakfast.   mirabegron ER (MYRBETRIQ) 50 MG TB24 tablet Take 50 mg by mouth daily.   mirtazapine (REMERON) 30 MG tablet Take 1 tablet by mouth at bedtime.   NOVOLOG FLEXPEN 100 UNIT/ML FlexPen Inject 0-10 Units into the skin See admin instructions. Sliding scale: CBG 1 to 150=0 units, 151-200=2 units, 201-250=4 units, 251-300=6 units, 301-350=8 units, 351-400=10 units >400 call Dr < 70 call Dr BID   ondansetron (ZOFRAN) 4 MG tablet Take 1 tablet (4 mg total) by mouth every 8 (eight) hours as needed for nausea or vomiting. (Patient taking differently: Take 4 mg by mouth every 6 (six) hours as needed for nausea or vomiting.)   polyethylene glycol (MIRALAX / GLYCOLAX) 17 g packet Take 17 g by mouth daily as needed (constipation).   potassium chloride (KLOR-CON M) 10 MEQ tablet Take 40 mEq by mouth daily.   rOPINIRole (REQUIP) 0.25 MG tablet Take 0.25 mg by mouth 3 (three) times daily.   sertraline (ZOLOFT) 100 MG tablet Take 100 mg by mouth daily.   dextrose 50 % solution Inject 1 ampule into the vein as needed for low blood sugar.   Glucagon 1 MG/0.2ML SOLN Inject 1 mg into the skin as needed (hypoglycemia).   Glucose 15 g PACK Take 15 g by mouth 2 (two) times  daily as needed (hypoglycemia).   guaiFENesin-dextromethorphan (ROBITUSSIN DM) 100-10 MG/5ML syrup Take 15 mLs by mouth every 4 (four) hours as needed for cough.   No facility-administered encounter medications on file as of 09/02/2022.    History obtained from review of EMR, discussion with facility staff/caregiver and/or patient.    Latest Ref Rng & Units 05/12/2022    3:42 AM 05/10/2022    4:22 AM 05/09/2022    4:47 AM  CBC  WBC 4.0 - 10.5 K/uL 5.9  6.3  6.3   Hemoglobin 12.0 - 15.0 g/dL 40.9  81.1  91.4   Hematocrit 36.0 - 46.0 % 34.0  34.8  35.9   Platelets 150 - 400 K/uL 164  161  154        Latest Ref Rng & Units 05/12/2022    3:42 AM 05/11/2022    6:34 AM 05/10/2022    4:22 AM  CMP  Glucose 70 - 99 mg/dL 96   91   BUN 8 - 23 mg/dL 18   22   Creatinine 7.82 - 1.00 mg/dL 9.56   2.13   Sodium 086 - 145 mmol/L 138   139   Potassium 3.5 - 5.1 mmol/L 3.5  3.2  3.1   Chloride 98 - 111 mmol/L 104   104   CO2 22 - 32 mmol/L 26   26   Calcium 8.9 - 10.3 mg/dL 8.6   8.3        Latest Ref Rng & Units 07/13/2019    9:20 PM 06/27/2019    1:48 AM 06/26/2019    3:56 PM  Hepatic Function  Total Protein 6.5 - 8.1 g/dL 6.6  6.1  7.1   Albumin 3.5 - 5.0 g/dL 3.5  3.2  3.9   AST 15 - 41 U/L 26  21  24    ALT 0 - 44 U/L 24  17  22    Alk Phosphatase 38 - 126 U/L 48  46  55   Total Bilirubin 0.3 - 1.2 mg/dL 0.7  0.7  0.8    HGB V7Q 6.3% on 05/08/22   I reviewed available labs, medications, imaging, studies and related documents  from the EMR.  There were no new records/imaging since last visit.   Physical Exam: GENERAL: NAD LUNGS: Bibasilar crackles otherwise clear, no increased work of breathing, room air CARDIAC:  S1S2, RRR with RSB murmur, No cyanosis.  Trace ankle edema bilat ABD:  Hypo-active BS x 4 quads, horizontal umbilical incision intact without drainage or erythema, soft, non-tender EXTREMITIES: Normal ROM, no deformity, strength equal, No muscle atrophy/subcutaneous fat loss NEURO:   No weakness, mild cognitive impairment PSYCH:  non-anxious affect, A & O x 3  Thank you for the opportunity to participate in the care of [name]. Please call our main office at [phone] if we can be of additional assistance.    Joycelyn Man FNP-C  [email or web address] .[facility]  Phone:  [phone]

## 2022-09-03 DIAGNOSIS — R41841 Cognitive communication deficit: Secondary | ICD-10-CM | POA: Diagnosis not present

## 2022-09-03 DIAGNOSIS — M6281 Muscle weakness (generalized): Secondary | ICD-10-CM | POA: Diagnosis not present

## 2022-09-03 DIAGNOSIS — Z9181 History of falling: Secondary | ICD-10-CM | POA: Diagnosis not present

## 2022-09-04 DIAGNOSIS — N3281 Overactive bladder: Secondary | ICD-10-CM | POA: Diagnosis not present

## 2022-09-04 DIAGNOSIS — I10 Essential (primary) hypertension: Secondary | ICD-10-CM | POA: Diagnosis not present

## 2022-09-04 DIAGNOSIS — R5381 Other malaise: Secondary | ICD-10-CM | POA: Diagnosis not present

## 2022-09-04 DIAGNOSIS — K429 Umbilical hernia without obstruction or gangrene: Secondary | ICD-10-CM | POA: Diagnosis not present

## 2022-09-04 DIAGNOSIS — G4700 Insomnia, unspecified: Secondary | ICD-10-CM | POA: Diagnosis not present

## 2022-09-10 DIAGNOSIS — I10 Essential (primary) hypertension: Secondary | ICD-10-CM | POA: Diagnosis not present

## 2022-09-15 DIAGNOSIS — R0602 Shortness of breath: Secondary | ICD-10-CM | POA: Diagnosis not present

## 2022-09-15 DIAGNOSIS — R5381 Other malaise: Secondary | ICD-10-CM | POA: Diagnosis not present

## 2022-09-15 DIAGNOSIS — I739 Peripheral vascular disease, unspecified: Secondary | ICD-10-CM | POA: Diagnosis not present

## 2022-09-15 DIAGNOSIS — I4891 Unspecified atrial fibrillation: Secondary | ICD-10-CM | POA: Diagnosis not present

## 2022-09-15 DIAGNOSIS — K5900 Constipation, unspecified: Secondary | ICD-10-CM | POA: Diagnosis not present

## 2022-09-15 DIAGNOSIS — G4700 Insomnia, unspecified: Secondary | ICD-10-CM | POA: Diagnosis not present

## 2022-09-15 DIAGNOSIS — N3281 Overactive bladder: Secondary | ICD-10-CM | POA: Diagnosis not present

## 2022-09-15 DIAGNOSIS — E119 Type 2 diabetes mellitus without complications: Secondary | ICD-10-CM | POA: Diagnosis not present

## 2022-09-15 DIAGNOSIS — N189 Chronic kidney disease, unspecified: Secondary | ICD-10-CM | POA: Diagnosis not present

## 2022-09-15 DIAGNOSIS — D508 Other iron deficiency anemias: Secondary | ICD-10-CM | POA: Diagnosis not present

## 2022-09-15 DIAGNOSIS — M1990 Unspecified osteoarthritis, unspecified site: Secondary | ICD-10-CM | POA: Diagnosis not present

## 2022-09-15 DIAGNOSIS — I10 Essential (primary) hypertension: Secondary | ICD-10-CM | POA: Diagnosis not present

## 2022-09-16 DIAGNOSIS — R0602 Shortness of breath: Secondary | ICD-10-CM | POA: Diagnosis not present

## 2022-09-18 DIAGNOSIS — G8929 Other chronic pain: Secondary | ICD-10-CM | POA: Diagnosis not present

## 2022-09-18 DIAGNOSIS — G4720 Circadian rhythm sleep disorder, unspecified type: Secondary | ICD-10-CM | POA: Diagnosis not present

## 2022-09-18 DIAGNOSIS — F329 Major depressive disorder, single episode, unspecified: Secondary | ICD-10-CM | POA: Diagnosis not present

## 2022-09-24 DIAGNOSIS — J811 Chronic pulmonary edema: Secondary | ICD-10-CM | POA: Diagnosis not present

## 2022-09-24 DIAGNOSIS — J189 Pneumonia, unspecified organism: Secondary | ICD-10-CM | POA: Diagnosis not present

## 2022-09-24 DIAGNOSIS — I4891 Unspecified atrial fibrillation: Secondary | ICD-10-CM | POA: Diagnosis not present

## 2022-09-24 DIAGNOSIS — D649 Anemia, unspecified: Secondary | ICD-10-CM | POA: Diagnosis not present

## 2022-09-24 DIAGNOSIS — E1142 Type 2 diabetes mellitus with diabetic polyneuropathy: Secondary | ICD-10-CM | POA: Diagnosis not present

## 2022-09-24 DIAGNOSIS — E559 Vitamin D deficiency, unspecified: Secondary | ICD-10-CM | POA: Diagnosis not present

## 2022-09-24 DIAGNOSIS — M797 Fibromyalgia: Secondary | ICD-10-CM | POA: Diagnosis not present

## 2022-09-24 DIAGNOSIS — I10 Essential (primary) hypertension: Secondary | ICD-10-CM | POA: Diagnosis not present

## 2022-09-24 DIAGNOSIS — G8929 Other chronic pain: Secondary | ICD-10-CM | POA: Diagnosis not present

## 2022-09-24 DIAGNOSIS — E119 Type 2 diabetes mellitus without complications: Secondary | ICD-10-CM | POA: Diagnosis not present

## 2022-09-24 DIAGNOSIS — F419 Anxiety disorder, unspecified: Secondary | ICD-10-CM | POA: Diagnosis not present

## 2022-09-24 DIAGNOSIS — M1990 Unspecified osteoarthritis, unspecified site: Secondary | ICD-10-CM | POA: Diagnosis not present

## 2022-09-24 DIAGNOSIS — I739 Peripheral vascular disease, unspecified: Secondary | ICD-10-CM | POA: Diagnosis not present

## 2022-09-24 DIAGNOSIS — I639 Cerebral infarction, unspecified: Secondary | ICD-10-CM | POA: Diagnosis not present

## 2022-10-01 DIAGNOSIS — E1142 Type 2 diabetes mellitus with diabetic polyneuropathy: Secondary | ICD-10-CM | POA: Diagnosis not present

## 2022-10-02 DIAGNOSIS — I10 Essential (primary) hypertension: Secondary | ICD-10-CM | POA: Diagnosis not present

## 2022-10-02 DIAGNOSIS — E1142 Type 2 diabetes mellitus with diabetic polyneuropathy: Secondary | ICD-10-CM | POA: Diagnosis not present

## 2022-10-02 DIAGNOSIS — G8929 Other chronic pain: Secondary | ICD-10-CM | POA: Diagnosis not present

## 2022-10-02 DIAGNOSIS — I5032 Chronic diastolic (congestive) heart failure: Secondary | ICD-10-CM | POA: Diagnosis not present

## 2022-10-02 DIAGNOSIS — K219 Gastro-esophageal reflux disease without esophagitis: Secondary | ICD-10-CM | POA: Diagnosis not present

## 2022-10-06 DIAGNOSIS — M6281 Muscle weakness (generalized): Secondary | ICD-10-CM | POA: Diagnosis not present

## 2022-10-09 DIAGNOSIS — E119 Type 2 diabetes mellitus without complications: Secondary | ICD-10-CM | POA: Diagnosis not present

## 2022-10-09 DIAGNOSIS — E559 Vitamin D deficiency, unspecified: Secondary | ICD-10-CM | POA: Diagnosis not present

## 2022-10-09 DIAGNOSIS — I10 Essential (primary) hypertension: Secondary | ICD-10-CM | POA: Diagnosis not present

## 2022-10-15 DIAGNOSIS — K5900 Constipation, unspecified: Secondary | ICD-10-CM | POA: Diagnosis not present

## 2022-10-15 DIAGNOSIS — N3281 Overactive bladder: Secondary | ICD-10-CM | POA: Diagnosis not present

## 2022-10-15 DIAGNOSIS — D508 Other iron deficiency anemias: Secondary | ICD-10-CM | POA: Diagnosis not present

## 2022-10-15 DIAGNOSIS — K219 Gastro-esophageal reflux disease without esophagitis: Secondary | ICD-10-CM | POA: Diagnosis not present

## 2022-10-15 DIAGNOSIS — I10 Essential (primary) hypertension: Secondary | ICD-10-CM | POA: Diagnosis not present

## 2022-10-15 DIAGNOSIS — N189 Chronic kidney disease, unspecified: Secondary | ICD-10-CM | POA: Diagnosis not present

## 2022-10-15 DIAGNOSIS — R42 Dizziness and giddiness: Secondary | ICD-10-CM | POA: Diagnosis not present

## 2022-10-15 DIAGNOSIS — R0602 Shortness of breath: Secondary | ICD-10-CM | POA: Diagnosis not present

## 2022-10-15 DIAGNOSIS — G4700 Insomnia, unspecified: Secondary | ICD-10-CM | POA: Diagnosis not present

## 2022-10-15 DIAGNOSIS — H109 Unspecified conjunctivitis: Secondary | ICD-10-CM | POA: Diagnosis not present

## 2022-10-15 DIAGNOSIS — R5381 Other malaise: Secondary | ICD-10-CM | POA: Diagnosis not present

## 2022-10-15 DIAGNOSIS — M1990 Unspecified osteoarthritis, unspecified site: Secondary | ICD-10-CM | POA: Diagnosis not present

## 2022-10-24 ENCOUNTER — Non-Acute Institutional Stay: Payer: PPO | Admitting: Family Medicine

## 2022-10-24 DIAGNOSIS — Z79899 Other long term (current) drug therapy: Secondary | ICD-10-CM

## 2022-10-24 DIAGNOSIS — F329 Major depressive disorder, single episode, unspecified: Secondary | ICD-10-CM | POA: Diagnosis not present

## 2022-10-24 DIAGNOSIS — G4720 Circadian rhythm sleep disorder, unspecified type: Secondary | ICD-10-CM | POA: Diagnosis not present

## 2022-10-24 DIAGNOSIS — Z8719 Personal history of other diseases of the digestive system: Secondary | ICD-10-CM

## 2022-10-24 DIAGNOSIS — G8929 Other chronic pain: Secondary | ICD-10-CM | POA: Diagnosis not present

## 2022-10-24 DIAGNOSIS — E1140 Type 2 diabetes mellitus with diabetic neuropathy, unspecified: Secondary | ICD-10-CM

## 2022-10-29 DIAGNOSIS — K219 Gastro-esophageal reflux disease without esophagitis: Secondary | ICD-10-CM | POA: Diagnosis not present

## 2022-10-29 DIAGNOSIS — E119 Type 2 diabetes mellitus without complications: Secondary | ICD-10-CM | POA: Diagnosis not present

## 2022-10-29 DIAGNOSIS — F329 Major depressive disorder, single episode, unspecified: Secondary | ICD-10-CM | POA: Diagnosis not present

## 2022-10-29 DIAGNOSIS — R5381 Other malaise: Secondary | ICD-10-CM | POA: Diagnosis not present

## 2022-10-29 DIAGNOSIS — I10 Essential (primary) hypertension: Secondary | ICD-10-CM | POA: Diagnosis not present

## 2022-10-29 DIAGNOSIS — E559 Vitamin D deficiency, unspecified: Secondary | ICD-10-CM | POA: Diagnosis not present

## 2022-10-29 DIAGNOSIS — M1990 Unspecified osteoarthritis, unspecified site: Secondary | ICD-10-CM | POA: Diagnosis not present

## 2022-10-29 DIAGNOSIS — N3281 Overactive bladder: Secondary | ICD-10-CM | POA: Diagnosis not present

## 2022-10-29 DIAGNOSIS — G8929 Other chronic pain: Secondary | ICD-10-CM | POA: Diagnosis not present

## 2022-10-29 DIAGNOSIS — K5900 Constipation, unspecified: Secondary | ICD-10-CM | POA: Diagnosis not present

## 2022-10-29 DIAGNOSIS — R062 Wheezing: Secondary | ICD-10-CM | POA: Diagnosis not present

## 2022-10-29 DIAGNOSIS — G4700 Insomnia, unspecified: Secondary | ICD-10-CM | POA: Diagnosis not present

## 2022-10-29 DIAGNOSIS — N189 Chronic kidney disease, unspecified: Secondary | ICD-10-CM | POA: Diagnosis not present

## 2022-10-30 DIAGNOSIS — N3281 Overactive bladder: Secondary | ICD-10-CM | POA: Diagnosis not present

## 2022-10-30 DIAGNOSIS — I10 Essential (primary) hypertension: Secondary | ICD-10-CM | POA: Diagnosis not present

## 2022-10-30 DIAGNOSIS — I4891 Unspecified atrial fibrillation: Secondary | ICD-10-CM | POA: Diagnosis not present

## 2022-10-30 DIAGNOSIS — M1990 Unspecified osteoarthritis, unspecified site: Secondary | ICD-10-CM | POA: Diagnosis not present

## 2022-10-30 DIAGNOSIS — F419 Anxiety disorder, unspecified: Secondary | ICD-10-CM | POA: Diagnosis not present

## 2022-10-30 DIAGNOSIS — E559 Vitamin D deficiency, unspecified: Secondary | ICD-10-CM | POA: Diagnosis not present

## 2022-10-30 DIAGNOSIS — N189 Chronic kidney disease, unspecified: Secondary | ICD-10-CM | POA: Diagnosis not present

## 2022-10-30 DIAGNOSIS — K5900 Constipation, unspecified: Secondary | ICD-10-CM | POA: Diagnosis not present

## 2022-10-30 DIAGNOSIS — I739 Peripheral vascular disease, unspecified: Secondary | ICD-10-CM | POA: Diagnosis not present

## 2022-10-30 DIAGNOSIS — R5381 Other malaise: Secondary | ICD-10-CM | POA: Diagnosis not present

## 2022-10-30 DIAGNOSIS — I639 Cerebral infarction, unspecified: Secondary | ICD-10-CM | POA: Diagnosis not present

## 2022-10-30 DIAGNOSIS — E119 Type 2 diabetes mellitus without complications: Secondary | ICD-10-CM | POA: Diagnosis not present

## 2022-10-30 DIAGNOSIS — F339 Major depressive disorder, recurrent, unspecified: Secondary | ICD-10-CM | POA: Diagnosis not present

## 2022-10-30 DIAGNOSIS — G2581 Restless legs syndrome: Secondary | ICD-10-CM | POA: Diagnosis not present

## 2022-10-30 DIAGNOSIS — J189 Pneumonia, unspecified organism: Secondary | ICD-10-CM | POA: Diagnosis not present

## 2022-10-30 DIAGNOSIS — G4700 Insomnia, unspecified: Secondary | ICD-10-CM | POA: Diagnosis not present

## 2022-10-30 DIAGNOSIS — M797 Fibromyalgia: Secondary | ICD-10-CM | POA: Diagnosis not present

## 2022-10-30 DIAGNOSIS — G8929 Other chronic pain: Secondary | ICD-10-CM | POA: Diagnosis not present

## 2022-10-30 DIAGNOSIS — K219 Gastro-esophageal reflux disease without esophagitis: Secondary | ICD-10-CM | POA: Diagnosis not present

## 2022-10-30 DIAGNOSIS — D508 Other iron deficiency anemias: Secondary | ICD-10-CM | POA: Diagnosis not present

## 2022-11-03 DIAGNOSIS — E1159 Type 2 diabetes mellitus with other circulatory complications: Secondary | ICD-10-CM | POA: Diagnosis not present

## 2022-11-03 DIAGNOSIS — B351 Tinea unguium: Secondary | ICD-10-CM | POA: Diagnosis not present

## 2022-11-04 ENCOUNTER — Encounter: Payer: Self-pay | Admitting: Family Medicine

## 2022-11-04 DIAGNOSIS — Z79899 Other long term (current) drug therapy: Secondary | ICD-10-CM | POA: Insufficient documentation

## 2022-11-04 NOTE — Progress Notes (Signed)
Therapist, nutritional Palliative Care Consult Note Telephone: [phone]  Fax: [phone]   Date of encounter: 10/24/22 10:55 AM  PATIENT NAME: [name] [facility] Room [address] [place] [address]   [phone] (home)  DOB: 23-Feb-1926 MRN: [id number] PRIMARY CARE PROVIDER:    Sherron Monday, MD,  [address] [place] [address] [phone]  REFERRING PROVIDER:   Sherron Monday, MD [address] [place],  [place] [address] [phone]  Health Care Agent/Health Care Power of Attorney:    Contact Information     Name Relation Home Work Mobile   [place] Son [phone]  [phone]   Jackey, [place]   [phone]        I met face to face with patient in [facility]. Palliative Care was asked to follow this patient by consultation request of Sherron Monday, MD to address advance care planning and complex medical decision making. This is a follow up visit.    CODE STATUS: MOST as of 12/11/2019: DNR/DNI Limited intervention IV fluid and antibiotics if needed No feeding tube    ASSESSMENT AND / RECOMMENDATIONS:  PPS: 50% Hx of umbilical hernia repair Resolved, incision healed. Regular bowel habits without significant constipation/straining.   Type 2 DM with long term insulin use Good control currently Continue to offer HS snack and follow current DM regimen  3. Polypharmacy No ability to de-prescribe at present.  Per facility, PCP at facility has already de-prescribed where she could.  Follow up Palliative Care Visit:  Palliative Care continuing to follow up by monitoring for changes in appetite, weight, functional and cognitive status for chronic disease progression and management in agreement with patient's stated goals of care. Next visit in 4 weeks or prn.  This visit was coded based on medical decision making (MDM).  Chief Complaint  Palliative Care  is following pt for chronic medical management in setting of return to assisted living post op following umbilical hernia repair.  HISTORY OF PRESENT ILLNESS: [place] Brye is a 89 y.o. year old female with chronic diastolic heart failure, hx of CVA and atrial fibrillation with no anticoagulation due to age/renal function and fall risk along with hx of bilateral SDH. Continues to c/o that food is not good.  Denies recent falls. Denies nausea and vomiting, constipation or bleeding. She states taking multiple meds multiple times per day including her eye drops and she is fatigued with this routine.  Reviewed her meds and there are no opportunities to de-prescribe to any significant degree.  She is using her feet to self propel in her wheelchair, denies pain. Pt states she cannot take Tylenol and it is insufficient to manage her joint pain.  She has asked to change to [employer].  On review [employer] contains 500 mg ASA which is not advisable at her age due to risk of GI bleed particularly with Sertraline   ACTIVITIES OF DAILY LIVING: CONTINENT OF BLADDER/BOWEL? Yes BATHING/DRESSING/FEEDING-Independent with feeding, requires some assistance with bathing and dressing  MOBILITY:   AMBULATORY WITH ASSISTIVE DEVICE: WHEELCHAIR-can transfer to and from wc  APPETITE? Fair, "lousy food" Likes fruit WEIGHT as of 08/09/22 was 134.4 lbs, on 05/14/22 was 136.4 lbs  CURRENT PROBLEM LIST:  Patient Active Problem List   Diagnosis Date Noted   Umbilical hernia 08/20/2022   Umbilical hernia without obstruction and without gangrene 07/31/2022   Hypomagnesemia 05/13/2022   At risk for aspiration pneumonia  due to patulous esophagus 05/13/2022   Palliative care encounter 05/13/2022   Hypertensive urgency    Subarachnoid hemorrhage following injury (HCC) 07/13/2019   Hypokalemia 06/27/2019   Stroke (HCC) 06/27/2019   Right hip pain 06/22/2019   Chronic left hip pain 05/25/2019   Depression,  recurrent (HCC) 05/25/2019   Chronic anticoagulation 04/19/2019   Leg cramps 03/04/2019   Short-term memory loss 03/04/2019   DOE (dyspnea on exertion) 03/04/2019   Aortic atherosclerosis (HCC) 03/04/2019   Physical deconditioning 01/24/2019   History of recurrent UTIs 12/03/2018   Chronic pain of right knee 10/08/2018   Type 2 diabetes mellitus with diabetic neuropathy, unspecified (HCC) 08/12/2018   Pulmonary vascular congestion    Chronic diastolic CHF (congestive heart failure) (HCC)    Permanent atrial fibrillation (HCC) 12/06/2017   Trochanteric bursitis, right hip 07/16/2017   Other intervertebral disc degeneration, lumbar region 07/16/2017   Osteoporosis 11/04/2016   Diabetic polyneuropathy associated with type 2 diabetes mellitus (HCC) 11/14/2014   Fibromyalgia 02/11/2013   CVA (cerebral vascular accident) (HCC) 08/01/2008   Osteoarthritis 04/09/2007   Hyperlipidemia associated with type 2 diabetes mellitus (HCC) 03/08/2007   Hypertension associated with diabetes (HCC) 03/08/2007   Pulmonary fibrosis (HCC) 03/08/2007   GERD 03/08/2007   BREAST CANCER, HX OF 03/08/2007   PAST MEDICAL HISTORY:  Active Ambulatory Problems    Diagnosis Date Noted   Hyperlipidemia associated with type 2 diabetes mellitus (HCC) 03/08/2007   Hypertension associated with diabetes (HCC) 03/08/2007   CVA (cerebral vascular accident) (HCC) 08/01/2008   Pulmonary fibrosis (HCC) 03/08/2007   GERD 03/08/2007   Osteoarthritis 04/09/2007   BREAST CANCER, HX OF 03/08/2007   Fibromyalgia 02/11/2013   Diabetic polyneuropathy associated with type 2 diabetes mellitus (HCC) 11/14/2014   Osteoporosis 11/04/2016   Trochanteric bursitis, right hip 07/16/2017   Other intervertebral disc degeneration, lumbar region 07/16/2017   Permanent atrial fibrillation (HCC) 12/06/2017   Pulmonary vascular congestion    Chronic diastolic CHF (congestive heart failure) (HCC)    Type 2 diabetes mellitus with diabetic  neuropathy, unspecified (HCC) 08/12/2018   Chronic pain of right knee 10/08/2018   History of recurrent UTIs 12/03/2018   Physical deconditioning 01/24/2019   Leg cramps 03/04/2019   Short-term memory loss 03/04/2019   DOE (dyspnea on exertion) 03/04/2019   Aortic atherosclerosis (HCC) 03/04/2019   Chronic anticoagulation 04/19/2019   Chronic left hip pain 05/25/2019   Depression, recurrent (HCC) 05/25/2019   Right hip pain 06/22/2019   Hypokalemia 06/27/2019   Stroke (HCC) 06/27/2019   Subarachnoid hemorrhage following injury (HCC) 07/13/2019   Hypertensive urgency    Hypomagnesemia 05/13/2022   At risk for aspiration pneumonia due to patulous esophagus 05/13/2022   Palliative care encounter 05/13/2022   Umbilical hernia without obstruction and without gangrene 07/31/2022   Umbilical hernia 08/20/2022   Resolved Ambulatory Problems    Diagnosis Date Noted   Diabetes (HCC) 08/01/2008   DVT 08/01/2008   Urinary tract infection 08/02/2012   Frequency 08/02/2012   DM (diabetes mellitus) (HCC) 08/02/2012   HTN (hypertension) 08/02/2012   HLD (hyperlipidemia) 08/02/2012   Frequency of urination 11/09/2012   Arthritis 11/09/2012   HLD (hyperlipidemia) 02/11/2013   Need for prophylactic vaccination and inoculation against influenza 02/11/2013   Grieving 07/14/2013   Neck pain    Frequent UTI    Shortness of breath 09/24/2015   Acute hypoxemic respiratory failure (HCC) 12/06/2017   Nausea 03/01/2018   Altered mental status 06/26/2019   Acute encephalopathy 06/27/2019  Heat stroke 06/27/2019   Fever, unspecified 06/27/2019   CHI (closed head injury), initial encounter 07/14/2019   Left radial head fracture 07/14/2019   SDH (subdural hematoma) (HCC)    Bilateral subdural hematomas (HCC) 07/16/2019   Encounter to establish care 02/20/2020   Acute postoperative pain of left knee 09/16/2021   Acute respiratory failure with hypoxia (HCC) 05/08/2022   Acute bronchitis due to  Rhinovirus 05/13/2022   Abnormal weight loss 05/13/2022   Past Medical History:  Diagnosis Date   Atrial fibrillation (HCC)    Cancer (HCC)    Cataract    Diabetes mellitus without complication (HCC)    Hyperlipidemia    Hypertension    Pneumonia    Scoliosis    SOCIAL HX:  Social History   Tobacco Use   Smoking status: Never   Smokeless tobacco: Never  Substance Use Topics   Alcohol use: No    Alcohol/week: 0.0 standard drinks of alcohol   FAMILY HX:  Family History  Problem Relation Age of Onset   Cancer Mother        lung   Arthritis Mother    Heart disease Father        endocarditis   Cancer Brother        lung, throat   Alcohol abuse Brother        Preferred Pharmacy: [facility]:  Allergies  Allergen Reactions   Diltiazem Hcl Itching, Rash and Other (See Comments)    Red itchy rash started a few hours after taking short acting 120mg  dilt tabs    Duloxetine Hcl Anxiety, Rash and Other (See Comments)    Makes patient feel "restless and scared" and unable to sleep   Ace Inhibitors Cough     PERTINENT MEDICATIONS:  Outpatient Encounter Medications as of 09/02/2022  Medication Sig   acetaminophen (TYLENOL) 325 MG tablet Take 650 mg by mouth every 6 (six) hours as needed (pain).   Apoaequorin (PREVAGEN EXTRA STRENGTH) 20 MG CAPS Take 20 mg by mouth daily.   atorvastatin (LIPITOR) 40 MG tablet Take 1 tablet (40 mg total) by mouth daily. (Patient taking differently: Take 40 mg by mouth every evening.)   Cholecalciferol (VITAMIN D3) 50 MCG (2000 UT) TABS Take 2,000 Units by mouth in the morning.   cloNIDine (CATAPRES) 0.1 MG tablet Take 0.1 mg by mouth 2 (two) times daily.   D-Mannose 500 MG CAPS Take 2,000 mg by mouth daily.   estradiol (ESTRACE) 0.1 MG/GM vaginal cream Place 1 Applicatorful vaginally See admin instructions. Apply 0.5g at bedtime on Mondays and Thursdays only.   hydrALAZINE (APRESOLINE) 25 MG tablet Take 1 tablet (25 mg total) by mouth 3 (three)  times daily. (Patient taking differently: Take 25 mg by mouth 4 (four) times daily. Hold for SBP < 130)   hydrochlorothiazide (HYDRODIURIL) 25 MG tablet Take 50 mg by mouth daily.   HYDROcodone-acetaminophen (NORCO/VICODIN) 5-325 MG tablet Take 1 tablet by mouth every 6 (six) hours as needed for moderate pain. (Patient taking differently: Take 1 tablet by mouth every 8 (eight) hours as needed for moderate pain. And once in am)   LEVEMIR FLEXTOUCH 100 UNIT/ML FlexTouch Pen Inject 6-9 Units into the skin See admin instructions. Inject 9 units in the morning and 6 units at bedtime.   loperamide (IMODIUM) 2 MG capsule Take 2 mg by mouth as needed for diarrhea or loose stools (up to 8 doses in 24 hours).   Magnesium 250 MG TABS Take 250 mg by mouth every  evening.   melatonin 5 MG TABS Take 5 mg by mouth at bedtime.   metFORMIN (GLUCOPHAGE) 500 MG tablet Take 1 tablet (500 mg total) by mouth daily with breakfast.   mirabegron ER (MYRBETRIQ) 50 MG TB24 tablet Take 50 mg by mouth daily.   mirtazapine (REMERON) 30 MG tablet Take 1 tablet by mouth at bedtime.   NOVOLOG FLEXPEN 100 UNIT/ML FlexPen Inject 0-10 Units into the skin See admin instructions. Sliding scale: CBG 1 to 150=0 units, 151-200=2 units, 201-250=4 units, 251-300=6 units, 301-350=8 units, 351-400=10 units >400 call Dr < 70 call Dr BID   ondansetron (ZOFRAN) 4 MG tablet Take 1 tablet (4 mg total) by mouth every 8 (eight) hours as needed for nausea or vomiting. (Patient taking differently: Take 4 mg by mouth every 6 (six) hours as needed for nausea or vomiting.)   polyethylene glycol (MIRALAX / GLYCOLAX) 17 g packet Take 17 g by mouth daily as needed (constipation).   potassium chloride (KLOR-CON M) 10 MEQ tablet Take 40 mEq by mouth daily.   rOPINIRole (REQUIP) 0.25 MG tablet Take 0.25 mg by mouth 3 (three) times daily.   sertraline (ZOLOFT) 100 MG tablet Take 100 mg by mouth daily.   dextrose 50 % solution Inject 1 ampule into the vein as  needed for low blood sugar.   Glucagon 1 MG/0.2ML SOLN Inject 1 mg into the skin as needed (hypoglycemia).   Glucose 15 g PACK Take 15 g by mouth 2 (two) times daily as needed (hypoglycemia).   guaiFENesin-dextromethorphan (ROBITUSSIN DM) 100-10 MG/5ML syrup Take 15 mLs by mouth every 4 (four) hours as needed for cough.   No facility-administered encounter medications on file as of 09/02/2022.    History obtained from review of EMR, discussion with facility staff/caregiver and/or patient.     I reviewed available labs, medications, imaging, studies and related documents from the EMR.  There were no new records/imaging since last visit.   Physical Exam: GENERAL: NAD LUNGS: CTAB, no increased work of breathing, room air CARDIAC:  S1S2, RRR with RSB murmur, No cyanosis/edema ABD:  Normo-active BS x 4 quads, horizontal umbilical incision well healed, soft, non-tender EXTREMITIES: Normal ROM, no deformity, strength equal but less in BLE, No muscle atrophy/subcutaneous fat loss NEURO:  No weakness, mild cognitive impairment PSYCH:  non-anxious affect, A & O x 3  Thank you for the opportunity to participate in the care of Dyandra Alcivar. Please call our main office at [phone] if we can be of additional assistance.    Joycelyn Man FNP-C  [email or web address] .[facility]  Phone:  [phone]

## 2022-11-17 DIAGNOSIS — E119 Type 2 diabetes mellitus without complications: Secondary | ICD-10-CM | POA: Diagnosis not present

## 2022-11-17 DIAGNOSIS — R300 Dysuria: Secondary | ICD-10-CM | POA: Diagnosis not present

## 2022-11-17 DIAGNOSIS — R5381 Other malaise: Secondary | ICD-10-CM | POA: Diagnosis not present

## 2022-11-17 DIAGNOSIS — M1990 Unspecified osteoarthritis, unspecified site: Secondary | ICD-10-CM | POA: Diagnosis not present

## 2022-11-17 DIAGNOSIS — N189 Chronic kidney disease, unspecified: Secondary | ICD-10-CM | POA: Diagnosis not present

## 2022-11-17 DIAGNOSIS — N3281 Overactive bladder: Secondary | ICD-10-CM | POA: Diagnosis not present

## 2022-11-17 DIAGNOSIS — I10 Essential (primary) hypertension: Secondary | ICD-10-CM | POA: Diagnosis not present

## 2022-11-17 DIAGNOSIS — E559 Vitamin D deficiency, unspecified: Secondary | ICD-10-CM | POA: Diagnosis not present

## 2022-11-17 DIAGNOSIS — G4700 Insomnia, unspecified: Secondary | ICD-10-CM | POA: Diagnosis not present

## 2022-11-17 DIAGNOSIS — F32A Depression, unspecified: Secondary | ICD-10-CM | POA: Diagnosis not present

## 2022-11-20 DIAGNOSIS — G4720 Circadian rhythm sleep disorder, unspecified type: Secondary | ICD-10-CM | POA: Diagnosis not present

## 2022-11-20 DIAGNOSIS — G8929 Other chronic pain: Secondary | ICD-10-CM | POA: Diagnosis not present

## 2022-11-20 DIAGNOSIS — F329 Major depressive disorder, single episode, unspecified: Secondary | ICD-10-CM | POA: Diagnosis not present

## 2022-11-20 DIAGNOSIS — N390 Urinary tract infection, site not specified: Secondary | ICD-10-CM | POA: Diagnosis not present

## 2022-11-27 DIAGNOSIS — E119 Type 2 diabetes mellitus without complications: Secondary | ICD-10-CM | POA: Diagnosis not present

## 2022-11-27 DIAGNOSIS — R5381 Other malaise: Secondary | ICD-10-CM | POA: Diagnosis not present

## 2022-11-27 DIAGNOSIS — N3281 Overactive bladder: Secondary | ICD-10-CM | POA: Diagnosis not present

## 2022-11-27 DIAGNOSIS — I10 Essential (primary) hypertension: Secondary | ICD-10-CM | POA: Diagnosis not present

## 2022-11-27 DIAGNOSIS — R300 Dysuria: Secondary | ICD-10-CM | POA: Diagnosis not present

## 2022-11-27 DIAGNOSIS — F329 Major depressive disorder, single episode, unspecified: Secondary | ICD-10-CM | POA: Diagnosis not present

## 2022-11-27 DIAGNOSIS — K5900 Constipation, unspecified: Secondary | ICD-10-CM | POA: Diagnosis not present

## 2022-11-27 DIAGNOSIS — N189 Chronic kidney disease, unspecified: Secondary | ICD-10-CM | POA: Diagnosis not present

## 2022-11-27 DIAGNOSIS — G4700 Insomnia, unspecified: Secondary | ICD-10-CM | POA: Diagnosis not present

## 2022-11-27 DIAGNOSIS — E559 Vitamin D deficiency, unspecified: Secondary | ICD-10-CM | POA: Diagnosis not present

## 2022-11-27 DIAGNOSIS — M1990 Unspecified osteoarthritis, unspecified site: Secondary | ICD-10-CM | POA: Diagnosis not present

## 2022-11-27 DIAGNOSIS — K219 Gastro-esophageal reflux disease without esophagitis: Secondary | ICD-10-CM | POA: Diagnosis not present

## 2022-11-28 DIAGNOSIS — M797 Fibromyalgia: Secondary | ICD-10-CM | POA: Diagnosis not present

## 2022-11-28 DIAGNOSIS — I4891 Unspecified atrial fibrillation: Secondary | ICD-10-CM | POA: Diagnosis not present

## 2022-11-28 DIAGNOSIS — E559 Vitamin D deficiency, unspecified: Secondary | ICD-10-CM | POA: Diagnosis not present

## 2022-11-28 DIAGNOSIS — N189 Chronic kidney disease, unspecified: Secondary | ICD-10-CM | POA: Diagnosis not present

## 2022-11-28 DIAGNOSIS — I639 Cerebral infarction, unspecified: Secondary | ICD-10-CM | POA: Diagnosis not present

## 2022-11-28 DIAGNOSIS — F419 Anxiety disorder, unspecified: Secondary | ICD-10-CM | POA: Diagnosis not present

## 2022-11-28 DIAGNOSIS — F339 Major depressive disorder, recurrent, unspecified: Secondary | ICD-10-CM | POA: Diagnosis not present

## 2022-11-28 DIAGNOSIS — G8929 Other chronic pain: Secondary | ICD-10-CM | POA: Diagnosis not present

## 2022-11-28 DIAGNOSIS — E119 Type 2 diabetes mellitus without complications: Secondary | ICD-10-CM | POA: Diagnosis not present

## 2022-11-28 DIAGNOSIS — I10 Essential (primary) hypertension: Secondary | ICD-10-CM | POA: Diagnosis not present

## 2022-11-28 DIAGNOSIS — I5032 Chronic diastolic (congestive) heart failure: Secondary | ICD-10-CM | POA: Diagnosis not present

## 2022-11-28 DIAGNOSIS — M1990 Unspecified osteoarthritis, unspecified site: Secondary | ICD-10-CM | POA: Diagnosis not present

## 2023-04-27 ENCOUNTER — Emergency Department (HOSPITAL_COMMUNITY): Payer: Medicare Other

## 2023-04-27 ENCOUNTER — Emergency Department (HOSPITAL_COMMUNITY)
Admission: EM | Admit: 2023-04-27 | Discharge: 2023-04-28 | Disposition: A | Discharge status: Home / Self Care | Payer: Medicare Other | Attending: Emergency Medicine | Admitting: Emergency Medicine

## 2023-04-27 ENCOUNTER — Encounter (HOSPITAL_COMMUNITY): Payer: Self-pay

## 2023-04-27 ENCOUNTER — Other Ambulatory Visit: Payer: Self-pay

## 2023-04-27 DIAGNOSIS — M25562 Pain in left knee: Secondary | ICD-10-CM | POA: Insufficient documentation

## 2023-04-27 DIAGNOSIS — Z8673 Personal history of transient ischemic attack (TIA), and cerebral infarction without residual deficits: Secondary | ICD-10-CM | POA: Diagnosis not present

## 2023-04-27 DIAGNOSIS — E119 Type 2 diabetes mellitus without complications: Secondary | ICD-10-CM | POA: Diagnosis not present

## 2023-04-27 DIAGNOSIS — Z853 Personal history of malignant neoplasm of breast: Secondary | ICD-10-CM | POA: Diagnosis not present

## 2023-04-27 DIAGNOSIS — Z79899 Other long term (current) drug therapy: Secondary | ICD-10-CM | POA: Insufficient documentation

## 2023-04-27 DIAGNOSIS — W19XXXA Unspecified fall, initial encounter: Secondary | ICD-10-CM

## 2023-04-27 DIAGNOSIS — S0990XA Unspecified injury of head, initial encounter: Secondary | ICD-10-CM | POA: Diagnosis present

## 2023-04-27 DIAGNOSIS — I10 Essential (primary) hypertension: Secondary | ICD-10-CM | POA: Insufficient documentation

## 2023-04-27 DIAGNOSIS — S0003XA Contusion of scalp, initial encounter: Secondary | ICD-10-CM | POA: Diagnosis not present

## 2023-04-27 DIAGNOSIS — W1830XA Fall on same level, unspecified, initial encounter: Secondary | ICD-10-CM | POA: Diagnosis not present

## 2023-04-27 DIAGNOSIS — Z7984 Long term (current) use of oral hypoglycemic drugs: Secondary | ICD-10-CM | POA: Diagnosis not present

## 2023-04-27 LAB — CBC WITH DIFFERENTIAL/PLATELET
Abs Immature Granulocytes: 0.02 10*3/uL (ref 0.00–0.07)
Basophils Absolute: 0 10*3/uL (ref 0.0–0.1)
Basophils Relative: 0 %
Eosinophils Absolute: 0.6 10*3/uL — ABNORMAL HIGH (ref 0.0–0.5)
Eosinophils Relative: 6 %
HCT: 42 % (ref 36.0–46.0)
Hemoglobin: 13.1 g/dL (ref 12.0–15.0)
Immature Granulocytes: 0 %
Lymphocytes Relative: 10 %
Lymphs Abs: 0.9 10*3/uL (ref 0.7–4.0)
MCH: 26.9 pg (ref 26.0–34.0)
MCHC: 31.2 g/dL (ref 30.0–36.0)
MCV: 86.2 fL (ref 80.0–100.0)
Monocytes Absolute: 1.1 10*3/uL — ABNORMAL HIGH (ref 0.1–1.0)
Monocytes Relative: 12 %
Neutro Abs: 6.4 10*3/uL (ref 1.7–7.7)
Neutrophils Relative %: 72 %
Platelets: 198 10*3/uL (ref 150–400)
RBC: 4.87 MIL/uL (ref 3.87–5.11)
RDW: 17.8 % — ABNORMAL HIGH (ref 11.5–15.5)
WBC: 9 10*3/uL (ref 4.0–10.5)
nRBC: 0 % (ref 0.0–0.2)

## 2023-04-27 LAB — COMPREHENSIVE METABOLIC PANEL
ALT: 19 U/L (ref 0–44)
AST: 35 U/L (ref 15–41)
Albumin: 3.8 g/dL (ref 3.5–5.0)
Alkaline Phosphatase: 48 U/L (ref 38–126)
Anion gap: 10 (ref 5–15)
BUN: 21 mg/dL (ref 8–23)
CO2: 27 mmol/L (ref 22–32)
Calcium: 9.3 mg/dL (ref 8.9–10.3)
Chloride: 101 mmol/L (ref 98–111)
Creatinine, Ser: 1.07 mg/dL — ABNORMAL HIGH (ref 0.44–1.00)
GFR, Estimated: 48 mL/min — ABNORMAL LOW (ref >60)
Glucose, Bld: 144 mg/dL — ABNORMAL HIGH (ref 70–99)
Potassium: 4.9 mmol/L (ref 3.5–5.1)
Sodium: 138 mmol/L (ref 135–145)
Total Bilirubin: 1.4 mg/dL — ABNORMAL HIGH (ref <1.2)
Total Protein: 7.4 g/dL (ref 6.5–8.1)

## 2023-04-27 LAB — CBG MONITORING, ED: Glucose-Capillary: 133 mg/dL — ABNORMAL HIGH (ref 70–99)

## 2023-04-27 NOTE — Discharge Instructions (Addendum)
You were seen today for a fall.  Your labs and imaging were reassuring for no emergent cause currently.  However if you begin experiencing headache, vision changes, weakness, numbness, tingling, shortness of breath, chest pain, confusion he will need to return to the ER for further evaluation.  Your bilirubin was slightly elevated at 1.4.  Recommend recheck with PCP.  However no emergent concerns at this time.  It was a pleasure seeing you in the ER today. Merry Christmas!

## 2023-04-27 NOTE — ED Notes (Signed)
Pt headed to CT and X-Ray.

## 2023-04-27 NOTE — ED Triage Notes (Addendum)
Coming from [facility] assisted living unwitnessed fall about 30 min prior to [facility] arrival. Hit head on floor. Hematoma back right side. Denies Loss of consciousness. GCS 15. C/o left knee pain. Had a previous surgery on that knee. Denies blood thinners. BP 150/70. All other vitals reported by EMS were [facility]

## 2023-04-27 NOTE — ED Notes (Signed)
Provider at bedside

## 2023-04-27 NOTE — ED Notes (Addendum)
[facility] transportation set up

## 2023-04-27 NOTE — ED Provider Notes (Signed)
[place] EMERGENCY DEPARTMENT AT [facility] Provider Note   CSN: [id number] Arrival date & time: 04/27/23  1842     History  Chief Complaint  Patient presents with   [name] is a 89 y.o. female.  The history is provided by the patient, the EMS personnel and a relative.  Fall  Patient is a 89 year old female presents to the ED post fall.  This happened 2 and half hours ago.  Previous medical history of CVA, pulmonary fibrosis, hypertension, GERD, osteoarthritis, breast cancer, type 2 diabetes, A-fib.  Patient states that she has fallen 19 times before.  This is the 20th time.  States that she stood up and does not remember why she fell over.  Larey Seat backwards and hit the back of her head on the ground.  Also states that her left knee hurts.  Denies LOC, blood thinners.  Denies recent illness, chest pain, shortness of breath, abdominal pain, upper limb pain, right lower limb pain.     Home Medications Prior to Admission medications   Medication Sig Start Date End Date Taking? Authorizing Provider  acetaminophen (TYLENOL) 325 MG tablet Take 650 mg by mouth every 6 (six) hours as needed (pain).    [provider]  Apoaequorin (PREVAGEN EXTRA STRENGTH) 20 MG CAPS Take 20 mg by mouth daily.    [provider]  atorvastatin (LIPITOR) 40 MG tablet Take 1 tablet (40 mg total) by mouth daily. Patient taking differently: Take 40 mg by mouth every evening. 08/01/22   Jodelle Gross, NP  Cholecalciferol (VITAMIN D3) 50 MCG (2000 UT) TABS Take 2,000 Units by mouth in the morning.    [provider]  cloNIDine (CATAPRES) 0.1 MG tablet Take 0.1 mg by mouth 2 (two) times daily.    [provider]  D-Mannose 500 MG CAPS Take 2,000 mg by mouth daily.    [provider]  dextrose 50 % solution Inject 1 ampule into the vein as needed for low blood sugar.    [provider]  estradiol (ESTRACE) 0.1 MG/GM vaginal  cream Place 1 Applicatorful vaginally See admin instructions. Apply 0.5g at bedtime on Mondays and Thursdays only.    [provider]  Glucagon 1 MG/0.2ML SOLN Inject 1 mg into the skin as needed (hypoglycemia).    [provider]  Glucose 15 g PACK Take 15 g by mouth 2 (two) times daily as needed (hypoglycemia).    [provider]  guaiFENesin-dextromethorphan (ROBITUSSIN DM) 100-10 MG/5ML syrup Take 15 mLs by mouth every 4 (four) hours as needed for cough.    [provider]  hydrALAZINE (APRESOLINE) 25 MG tablet Take 1 tablet (25 mg total) by mouth 3 (three) times daily. Patient taking differently: Take 25 mg by mouth 4 (four) times daily. Hold for SBP < 130 08/05/21   Gaston Islam., NP  hydrochlorothiazide (HYDRODIURIL) 25 MG tablet Take 50 mg by mouth daily. 07/15/21   [provider]  HYDROcodone-acetaminophen (NORCO/VICODIN) 5-325 MG tablet Take 1 tablet by mouth every 6 (six) hours as needed for moderate pain. Patient taking differently: Take 1 tablet by mouth every 8 (eight) hours as needed for moderate pain. And once in am 08/21/22   Manus Rudd, MD  LEVEMIR FLEXTOUCH 100 UNIT/ML FlexTouch Pen Inject 6-9 Units into the skin See admin instructions. Inject 9 units in the morning and 6 units at bedtime. 07/14/21   [provider]  loperamide (IMODIUM) 2 MG  capsule Take 2 mg by mouth as needed for diarrhea or loose stools (up to 8 doses in 24 hours).    [provider]  Magnesium 250 MG TABS Take 250 mg by mouth every evening.    [provider]  melatonin 5 MG TABS Take 5 mg by mouth at bedtime. 06/11/22   [provider]  metFORMIN (GLUCOPHAGE) 500 MG tablet Take 1 tablet (500 mg total) by mouth daily with breakfast. 02/20/20   Daryll Drown, NP  mirabegron ER (MYRBETRIQ) 50 MG TB24 tablet Take 50 mg by mouth daily. 08/18/22   [provider]  mirtazapine (REMERON) 30 MG tablet Take 1 tablet by mouth  at bedtime. 07/14/21   [provider]  NOVOLOG FLEXPEN 100 UNIT/ML FlexPen Inject 0-10 Units into the skin See admin instructions. Sliding scale: CBG 1 to 150=0 units, 151-200=2 units, 201-250=4 units, 251-300=6 units, 301-350=8 units, 351-400=10 units >400 call Dr < 70 call Dr BID 09/14/19   [provider]  ondansetron (ZOFRAN) 4 MG tablet Take 1 tablet (4 mg total) by mouth every 8 (eight) hours as needed for nausea or vomiting. Patient taking differently: Take 4 mg by mouth every 6 (six) hours as needed for nausea or vomiting. 02/20/20   Daryll Drown, NP  polyethylene glycol (MIRALAX / GLYCOLAX) 17 g packet Take 17 g by mouth daily as needed (constipation).    [provider]  potassium chloride (KLOR-CON M) 10 MEQ tablet Take 40 mEq by mouth daily.    [provider]  rOPINIRole (REQUIP) 0.25 MG tablet Take 0.25 mg by mouth 3 (three) times daily.    [provider]  sertraline (ZOLOFT) 100 MG tablet Take 100 mg by mouth daily. 07/12/21   [provider]      Allergies    Diltiazem hcl, Duloxetine hcl, and Ace inhibitors    Review of Systems   Review of Systems  Neurological:  Positive for dizziness.  All other systems reviewed and are negative.   Physical Exam Updated Vital Signs BP (!) 153/64   Pulse (!) 52   Temp (!) 97.5 F (36.4 C) (Oral)   Resp 16   Ht 4\' 11"  (1.499 m)   Wt 61.2 kg   SpO2 94%   BMI 27.27 kg/m  Physical Exam Vitals and nursing note reviewed.  Constitutional:      General: She is not in acute distress.    Appearance: Normal appearance. She is not ill-appearing, toxic-appearing or diaphoretic.  HENT:     Head: Normocephalic.     Comments: 6 cm x 3 cm hematoma present on the right side of her occipital skull. Eyes:     Extraocular Movements: Extraocular movements intact.     Conjunctiva/sclera: Conjunctivae normal.  Cardiovascular:     Rate and Rhythm: Normal rate. Rhythm irregular.     Pulses:  Normal pulses.  Pulmonary:     Effort: Pulmonary effort is normal. No respiratory distress.     Breath sounds: Normal breath sounds.  Abdominal:     General: Abdomen is flat.     Palpations: Abdomen is soft.     Tenderness: There is no abdominal tenderness.  Musculoskeletal:        General: Tenderness (Tenderness over left knee.) and signs of injury present. No deformity.  Skin:    General: Skin is warm and dry.     Coloration: Skin is not jaundiced or pale.     Findings: No erythema.  Neurological:  General: No focal deficit present.     Mental Status: She is alert and oriented to person, place, and time. Mental status is at baseline.     Sensory: No sensory deficit.     Motor: No weakness.     Coordination: Coordination normal.  Psychiatric:        Mood and Affect: Mood normal.     ED Results / Procedures / Treatments   Labs (all labs ordered are listed, but only abnormal results are displayed) Labs Reviewed  CBC WITH DIFFERENTIAL/PLATELET - Abnormal; Notable for the following components:      Result Value   RDW 17.8 (*)    Monocytes Absolute 1.1 (*)    Eosinophils Absolute 0.6 (*)    All other components within normal limits  COMPREHENSIVE METABOLIC PANEL - Abnormal; Notable for the following components:   Glucose, Bld 144 (*)    Creatinine, Ser 1.07 (*)    Total Bilirubin 1.4 (*)    GFR, Estimated 48 (*)    All other components within normal limits  CBG MONITORING, ED - Abnormal; Notable for the following components:   Glucose-Capillary 133 (*)    All other components within normal limits    EKG EKG Interpretation Date/Time:  Monday April 27 2023 20:29:24 EST Ventricular Rate:  51 PR Interval:  304 QRS Duration:  155 QT Interval:  461 QTC Calculation: 425 R Axis:   112  Text Interpretation: Atrial fibrillation Nonspecific intraventricular conduction delay Borderline T abnormalities, lateral leads Confirmed by Alvino Blood ([id number]) on 04/27/2023  8:39:02 PM  Radiology DG Knee 2 Views Left Result Date: 04/27/2023 CLINICAL DATA:  Status post fall. EXAM: LEFT KNEE - 1-2 VIEW COMPARISON:  None Available. FINDINGS: A linear area of cortical irregularity is seen involving the medial aspect of the distal left femoral shaft. This is of indeterminate age. There is no evidence of dislocation. A left knee replacement is seen without evidence of surrounding lucency to suggest the presence of hardware loosening or infection. Soft tissues are unremarkable. IMPRESSION: 1. Findings which may represent a nondisplaced fracture of indeterminate age involving the distal left femoral shaft. Correlation with physical examination is recommended to determine the presence of point tenderness. 2. Left knee replacement. Electronically Signed   By: Aram Candela M.D.   On: 04/27/2023 21:37   CT CERVICAL SPINE WO CONTRAST Result Date: 04/27/2023 CLINICAL DATA:  Unwitnessed fall. EXAM: CT CERVICAL SPINE WITHOUT CONTRAST TECHNIQUE: Multidetector CT imaging of the cervical spine was performed without intravenous contrast. Multiplanar CT image reconstructions were also generated. RADIATION DOSE REDUCTION: This exam was performed according to the departmental dose-optimization program which includes automated exposure control, adjustment of the mA and/or kV according to patient size and/or use of iterative reconstruction technique. COMPARISON:  October 24, 2020 FINDINGS: Alignment: There is approximately 2 mm anterolisthesis of the C3 vertebral body on C4. Skull base and vertebrae: No acute fracture. No primary bone lesion or focal pathologic process. Soft tissues and spinal canal: No prevertebral fluid or swelling. No visible canal hematoma. Disc levels: Moderate severity endplate sclerosis, anterior osteophyte formation and posterior bony spurring is seen at the levels of C3-C4, C4-C5, C5-C6 and C6-C7. Marked severity intervertebral disc space narrowing is seen at C5-C6, with  moderate to marked severity intervertebral disc space narrowing at C3-C4 and C4-C5. Bilateral marked severity multilevel facet joint hypertrophy is noted. Upper chest: Negative. Other: None. IMPRESSION: 1. No acute fracture or traumatic subluxation of the cervical spine. 2. Marked severity multilevel degenerative  changes, as described above. Electronically Signed   By: Aram Candela M.D.   On: 04/27/2023 21:32   CT HEAD WO CONTRAST Result Date: 04/27/2023 CLINICAL DATA:  Unwitnessed fall. EXAM: CT HEAD WITHOUT CONTRAST TECHNIQUE: Contiguous axial images were obtained from the base of the skull through the vertex without intravenous contrast. RADIATION DOSE REDUCTION: This exam was performed according to the departmental dose-optimization program which includes automated exposure control, adjustment of the mA and/or kV according to patient size and/or use of iterative reconstruction technique. COMPARISON:  March 04, 2021 FINDINGS: Brain: There is moderate severity cerebral atrophy with widening of the extra-axial spaces and ventricular dilatation. There are areas of decreased attenuation within the white matter tracts of the supratentorial brain, consistent with microvascular disease changes. A small chronic right basal ganglia lacunar infarct is noted. Vascular: Marked severity bilateral cavernous carotid artery calcification is noted. Skull: Normal. Negative for fracture or focal lesion. Sinuses/Orbits: No acute finding. Other: There is mild right parieto-occipital scalp soft tissue swelling. IMPRESSION: 1. Mild right parieto-occipital scalp soft tissue swelling without evidence for an acute fracture or acute intracranial abnormality. 2. Generalized cerebral atrophy with chronic white matter small vessel ischemic changes. 3. Small chronic right basal ganglia lacunar infarct. Electronically Signed   By: Aram Candela M.D.   On: 04/27/2023 21:25    Procedures Procedures    Medications Ordered in  ED Medications - No data to display  ED Course/ Medical Decision Making/ A&P Clinical Course as of 04/27/23 2159  Mon Apr 27, 2023  2105 EKG 12-Lead [CB]    Clinical Course User Index [CB] Lunette Stands, PA-C                                 Medical Decision Making  This patient is a 89 year old female who presents to the ED for concern of head injury post fall.   Differential diagnoses prior to evaluation: The emergent differential diagnosis includes, but is not limited to, vasovagal, dehydration, seizure, head bleed, tumor, hypoxia, drug-induced syncope, hypoglycemia. This is not an exhaustive differential.   Past Medical History / Co-morbidities / Social History: CVA, pulmonary fibrosis, hypertension, GERD, osteoarthritis, A-fib, type 2 diabetes.  Previously seen in November for bradycardia and worked up without abnormality found.   Lab Tests/Imaging studies: I personally interpreted labs/imaging and the pertinent results include:   CBC unremarkable CMP shows slightly elevated bilirubin at 1.4 otherwise unremarkable CBC unremarkable X-ray left knee showed findings which may represent a nondisplaced fracture of indeterminate age involving the distal left femoral shaft. CT head and neck showed mild right parieto-occipital scalp soft tissue swelling without signs of fracture or intracranial abnormality.  Also showed signs of generalized cerebral atrophy, small chronic right basal ganglia lacunar infarct I agree with the radiologist interpretation.  Cardiac monitoring: EKG obtained and interpreted by myself and attending physician which shows: A-fib   Medications: No medications needed for this visit.  I have reviewed the patients home medicines and have made adjustments as needed.  ED Course:  Patient is a 89 year old female post fall.  Full history of CVA, pulmonary fibrosis, hypertension, GERD, osteoporosis, A-fib, type2 diabetes.  She states she stood up too quickly and  fell backwards hitting the back of her head on the ground.  She states that she did not lose consciousness, not on blood thinners.  Says that she immediately started calling for help where nursing staff picked her up  and brought her to the ED due to the hematoma on the back of her head.  Upon examination 3 x 6 cm hematoma present on the right side is a bit swollen.  No neck tenderness, back tenderness, hip tenderness, upper extremity tenderness.  Does endorse left-sided knee tenderness upon palpation.  Previous knee replacement surgery present on the side.  Labs were unremarkable outside of a mildly elevated bilirubin of 1.4.  X-ray of left knee showed possible signs of fracture however no point tenderness with felt on exam.  CT head and neck showed chronic abnormalities without any acute findings from the fall.  At this time patient's vitals have been stable and patient appears stable to be discharged.  Recommend that she follows up with her PCP for slightly elevated bilirubin for recheck.  Provided strict return to ER precautions.  Patient and family expressed agreement and understanding with plan.  Patient's case was also discussed with the family.  Who agreed with plan.   Disposition: After consideration of the diagnostic results and the patients response to treatment, I feel that patient benefit from discharge and treatment noted as above.   emergency department workup does not suggest an emergent condition requiring admission or immediate intervention beyond what has been performed at this time. The plan is: Fall prevention, return to ER for new or worsening symptoms.. The patient is safe for discharge and has been instructed to return immediately for worsening symptoms, change in symptoms or any other concerns.   Final Clinical Impression(s) / ED Diagnoses Final diagnoses:  Fall, initial encounter  Injury of head, initial encounter    Rx / DC Orders ED Discharge Orders     None          Lavonia Drafts 04/27/23 2200    Lonell Grandchild, MD 04/28/23 [phone]

## 2023-04-28 NOTE — ED Notes (Signed)
[employer] called, spoke with [place], and made aware that Pt is en route back to facility.

## 2023-09-17 NOTE — Progress Notes (Deleted)
 Cardiology Clinic Note   Patient Name: [name] Date of Encounter: 09/17/2023  Primary Care Provider:  Shari Daughters, MD Primary Cardiologist:  Joan Luo [place], MD  Patient Profile    89 y.o. y/o female with a h/o hypertension, HL, Type II diabetes, atrial fibrillation (not currently on anticoagulation), CVA, chronic diastolic CHF and pulmonary fibrosis.  She is currently under Palliative Care with plans per [facility] do to decline in function, weight, eating.    Past Medical History    Past Medical History:  Diagnosis Date   Acute bronchitis due to Rhinovirus 05/13/2022   Acute encephalopathy 06/27/2019   Acute hypoxemic respiratory failure (HCC) 12/06/2017   Acute postoperative pain of left knee 09/16/2021   Acute respiratory failure with hypoxia (HCC) 05/08/2022   Altered mental status 06/26/2019   Arthritis    Atrial fibrillation (HCC)    Bilateral subdural hematomas (HCC) 07/16/2019   Cancer (HCC)    breast   Cataract    CHI (closed head injury), initial encounter 07/14/2019   Diabetes mellitus without complication (HCC)    type 2   Encounter to establish care 02/20/2020   Fever, unspecified 06/27/2019   Frequent UTI    Heat stroke 06/27/2019   Hyperlipidemia    Hypertension    Left radial head fracture 07/14/2019   Neck pain    Pneumonia    Scoliosis    SDH (subdural hematoma) (HCC)    Stroke [facility])    Past Surgical History:  Procedure Laterality Date   APPENDECTOMY     BREAST LUMPECTOMY Left    CHOLECYSTECTOMY     JOINT REPLACEMENT     bilat knee    REPLACEMENT TOTAL KNEE BILATERAL Bilateral    TONSILLECTOMY     UMBILICAL HERNIA REPAIR N/A 08/20/2022   Procedure: OPEN UMBILICAL HERNIA REPAIR WITH MESH;  Surgeon: Joan Ebbing, MD;  Location: [facility] OR;  Service: General;  Laterality: N/A;    Allergies  Allergies  Allergen Reactions   Diltiazem  Hcl Itching, Rash and Other (See Comments)    Red itchy rash started a few hours after taking  short acting 120mg  dilt tabs    Duloxetine  Hcl Anxiety, Rash and Other (See Comments)    Makes patient feel "restless and scared" and unable to sleep   Ace Inhibitors Cough    History of Present Illness    [name] is a very pleasant 89 year old female who comes today for annual follow-up.  She is a resident of [facility] in [place]  and is accompanied by nurses aide.  She denies any chest pain, dyspnea on exertion, PND, orthopnea, or excessive lower extremity edema.  She is very sedentary she has a history of a minimum 21 falls, and this accompanied to the bathroom and other areas within the facility by a skilled worker.  Medications were provided by the facility.  She she states her energy level is low, and so is her appetite.  I did have an opportunity to review all of her weights and they have been stable between 132 and 132 pounds since January 2024.  Labs are also drawn at the skilled nursing facility, with most recent in February 2024.  These labs were within normal limits.  Home Medications    Current Outpatient Medications  Medication Sig Dispense Refill   acetaminophen  (TYLENOL ) 325 MG tablet Take 650 mg by mouth every 6 (six) hours as needed (pain).     Apoaequorin (PREVAGEN EXTRA STRENGTH) 20 MG CAPS Take 20  mg by mouth daily.     atorvastatin  (LIPITOR ) 40 MG tablet Take 1 tablet (40 mg total) by mouth daily. (Patient taking differently: Take 40 mg by mouth every evening.) 90 tablet 3   Cholecalciferol  (VITAMIN D3) 50 MCG (2000 UT) TABS Take 2,000 Units by mouth in the morning.     cloNIDine  (CATAPRES ) 0.1 MG tablet Take 0.1 mg by mouth 2 (two) times daily.     D-Mannose 500 MG CAPS Take 2,000 mg by mouth daily.     dextrose  50 % solution Inject 1 ampule into the vein as needed for low blood sugar.     estradiol (ESTRACE) 0.1 MG/GM vaginal cream Place 1 Applicatorful vaginally See admin instructions. Apply 0.5g at bedtime on Mondays and Thursdays only.      Glucagon 1 MG/0.2ML SOLN Inject 1 mg into the skin as needed (hypoglycemia).     Glucose 15 g PACK Take 15 g by mouth 2 (two) times daily as needed (hypoglycemia).     guaiFENesin -dextromethorphan (ROBITUSSIN DM) 100-10 MG/5ML syrup Take 15 mLs by mouth every 4 (four) hours as needed for cough.     hydrALAZINE  (APRESOLINE ) 25 MG tablet Take 1 tablet (25 mg total) by mouth 3 (three) times daily. (Patient taking differently: Take 25 mg by mouth 4 (four) times daily. Hold for SBP < 130) 270 tablet 3   hydrochlorothiazide  (HYDRODIURIL ) 25 MG tablet Take 50 mg by mouth daily.     HYDROcodone -acetaminophen  (NORCO/VICODIN) 5-325 MG tablet Take 1 tablet by mouth every 6 (six) hours as needed for moderate pain. (Patient taking differently: Take 1 tablet by mouth every 8 (eight) hours as needed for moderate pain. And once in am) 30 tablet 0   LEVEMIR FLEXTOUCH 100 UNIT/ML FlexTouch Pen Inject 6-9 Units into the skin See admin instructions. Inject 9 units in the morning and 6 units at bedtime.     loperamide (IMODIUM) 2 MG capsule Take 2 mg by mouth as needed for diarrhea or loose stools (up to 8 doses in 24 hours).     Magnesium  250 MG TABS Take 250 mg by mouth every evening.     melatonin 5 MG TABS Take 5 mg by mouth at bedtime.     metFORMIN  (GLUCOPHAGE ) 500 MG tablet Take 1 tablet (500 mg total) by mouth daily with breakfast. 90 tablet 0   mirabegron  ER (MYRBETRIQ ) 50 MG TB24 tablet Take 50 mg by mouth daily.     mirtazapine  (REMERON ) 30 MG tablet Take 1 tablet by mouth at bedtime.     NOVOLOG  FLEXPEN 100 UNIT/ML FlexPen Inject 0-10 Units into the skin See admin instructions. Sliding scale: CBG 1 to 150=0 units, 151-200=2 units, 201-250=4 units, 251-300=6 units, 301-350=8 units, 351-400=10 units >400 call Dr < 70 call Dr BID     ondansetron  (ZOFRAN ) 4 MG tablet Take 1 tablet (4 mg total) by mouth every 8 (eight) hours as needed for nausea or vomiting. (Patient taking differently: Take 4 mg by mouth every 6  (six) hours as needed for nausea or vomiting.) 90 tablet 0   polyethylene glycol (MIRALAX  / GLYCOLAX ) 17 g packet Take 17 g by mouth daily as needed (constipation).     potassium chloride  (KLOR-CON  M) 10 MEQ tablet Take 40 mEq by mouth daily.     rOPINIRole  (REQUIP ) 0.25 MG tablet Take 0.25 mg by mouth 3 (three) times daily.     sertraline  (ZOLOFT ) 100 MG tablet Take 100 mg by mouth daily.     No current  facility-administered medications for this visit.     Family History    Family History  Problem Relation Age of Onset   Cancer Mother        lung   Arthritis Mother    Heart disease Father        endocarditis   Cancer Brother        lung, throat   Alcohol abuse Brother    She indicated that her mother is deceased. She indicated that her father is deceased. She indicated that her brother is deceased. She indicated that her maternal grandmother is deceased. She indicated that her maternal grandfather is deceased. She indicated that her paternal grandmother is deceased. She indicated that her paternal grandfather is deceased. She indicated that both of her sons are alive.  Social History    Social History   Socioeconomic History   Marital status: Widowed    Spouse name: Not on file   Number of children: 2   Years of education: 97   Highest education level: Some college, no degree  Occupational History   Occupation: Retired    Associate Professor: [employer]    CommentArboriculturist  Tobacco Use   Smoking status: Never   Smokeless tobacco: Never  Vaping Use   Vaping status: Never Used  Substance and Sexual Activity   Alcohol use: No    Alcohol/week: 0.0 standard drinks of alcohol   Drug use: No   Sexual activity: Not Currently    Birth control/protection: Surgical    Comment: Hysterectomy  Other Topics Concern   Not on file  Social History Narrative   Lives alone in a one story home.  Husband passed away. She has two sons 2 sons but.  Retired from [employer] as a Diplomatic Services operational officer.   Education: specialty courses with [employer], high school education.   Social Drivers of Corporate investment banker Strain: Low Risk  (08/11/2021)   Received from [facility]   Overall Financial Resource Strain (CARDIA)    Difficulty of Paying Living Expenses: Not hard at all  Food Insecurity: No Food Insecurity (08/20/2022)   Hunger Vital Sign    Worried About Running Out of Food in the Last Year: Never true    Ran Out of Food in the Last Year: Never true  Transportation Needs: No Transportation Needs (08/20/2022)   PRAPARE - Administrator, Civil Service (Medical): No    Lack of Transportation (Non-Medical): No  Physical Activity: Inactive (04/12/2021)   Exercise Vital Sign    Days of Exercise per Week: 0 days    Minutes of Exercise per Session: 0 min  Stress: No Stress Concern Present (09/02/2021)   Received from [facility], [facility] - Occupational Stress Questionnaire    Feeling of Stress : Only a little  Social Connections: Unknown (09/02/2021)   Received from [facility], [facility]   Social Network    Social Network: Not on file  Intimate Partner Violence: Not At Risk (08/20/2022)   Humiliation, Afraid, Rape, and Kick questionnaire    Fear of Current or Ex-Partner: No    Emotionally Abused: No    Physically Abused: No    Sexually Abused: No     Review of Systems    General:  No chills, fever, night sweats or weight changes.  Cardiovascular:  No chest pain, dyspnea on exertion, edema, orthopnea, palpitations, paroxysmal nocturnal dyspnea. Dermatological: No rash, lesions/masses Respiratory: No cough, dyspnea  Urologic: No hematuria, dysuria Abdominal:   No nausea, vomiting, diarrhea, bright red blood per rectum, melena, or hematemesis Neurologic:  No visual changes, wkns, changes in mental status. All other systems reviewed and are otherwise negative except as noted above.     Physical  Exam    VS:  There were no vitals taken for this visit. , BMI There is no height or weight on file to calculate BMI.     GEN: Well nourished, well developed, in no acute distress.  Sitting in wheelchair HEENT: normal. Neck: Supple, no JVD, soft bilateral carotid bruits, or masses. Cardiac: RRR, systolic 2/6 follow murmurs, rubs, or gallops. No clubbing, cyanosis, mild 1+ ankle dependent edema.  Radials/DP/PT 2+ and equal bilaterally.  Respiratory:  Respirations regular and unlabored, clear to auscultation bilaterally.  Mild crackles in the bases GI: Soft, nontender, nondistended, BS + x 4. MS: no deformity or atrophy. Skin: warm and dry, no rash. Neuro:  Strength and sensation are intact. Psych: Normal affect.  Accessory Clinical Findings    ECG personally reviewed by me today-not completed this office visit- Lab Results  Component Value Date   WBC 9.0 04/27/2023   HGB 13.1 04/27/2023   HCT 42.0 04/27/2023   MCV 86.2 04/27/2023   PLT 198 04/27/2023   Lab Results  Component Value Date   CREATININE 1.07 (H) 04/27/2023   BUN 21 04/27/2023   NA 138 04/27/2023   K 4.9 04/27/2023   CL 101 04/27/2023   CO2 27 04/27/2023   Lab Results  Component Value Date   ALT 19 04/27/2023   AST 35 04/27/2023   ALKPHOS 48 04/27/2023   BILITOT 1.4 (H) 04/27/2023   Lab Results  Component Value Date   CHOL 115 02/24/2020   HDL 46 02/24/2020   LDLCALC 54 02/24/2020   TRIG 71 02/24/2020   CHOLHDL 2.5 02/24/2020    Lab Results  Component Value Date   HGBA1C 6.3 (H) 05/08/2022    Review of Prior Studies: Echocardiogram 06/27/2019 1. Left ventricular ejection fraction, by estimation, is 60 to 65%. The  left ventricle has normal function. The left ventricle has no regional  wall motion abnormalities.LV diastolic filling could not be assessed due  to underlying atrial fibrillation.   2. Right ventricular systolic function is normal. The right ventricular  size is normal.   3. The mitral  valve is degenerative. No evidence of mitral valve  regurgitation. No evidence of mitral stenosis.   4. The aortic valve is tricuspid. Aortic valve regurgitation is not  visualized. Mild to moderate aortic valve sclerosis/calcification is  present, without any evidence of aortic stenosis.   5. The inferior vena cava is normal in size with greater than 50%  respiratory variability, suggesting right atrial pressure of 3 mmHg.   6. Left atrial size was mild to moderately dilated.   FINDINGS   Left Ventricle: Left ventricular ejection fraction, by estimation, is 60  to 65%. The left ventricle has normal function. The left ventricle has no  regional wall motion abnormalities. The left ventricular internal cavity  size was normal in size. There is   no left ventricular hypertrophy. LC diastolic filling could not be  assessed due to underlying atrial fibrillation. Elevated left ventricular  end-diastolic pressure.   Assessment & Plan   1.  Chronic diastolic CHF: No evidence of volume overload on exam.  She is on HCTZ 25 mg daily.  She states that she urinates well and has not had any complaints  of significant edema.  [place]  heart association class I functional class.  No changes in her regimen at this time.  2.  Permanent atrial fibrillation: She is not on any anticoagulation therapy due to her age and frequent falls and history of subdural hematoma with subarachnoid hemorrhage.  Heart rate is well-controlled.  She is not very active just due to generalized fatigue.  She is not on AV nodal blocking agents at this time.  3.  Hypertension: Remains on clonidine  0.1 mg twice daily along with hydralazine  25 mg 3 times daily, with diuretic HCTZ 50 mg daily.  Blood pressure has been well-controlled on review of SNF blood pressure readings which accompanying her and her packet today.  Usually running in the 120s to 130s systolic.  No changes in her medication regimen at this time.  Labs are followed by  skilled nursing facility      4.  Hypercholesterolemia: On high-dose atorvastatin  80 mg daily.  Will decrease to 40 mg daily due to her age.  She does have a history of CVA and hyperlipidemia.  5.  Type 2 diabetes: Followed by PCP at skilled nursing facility.  Defer follow-up labs to that provider.  Signed, Delorise Hunkele [place] MD, [facility]    09/17/2023 2:13 PM      Office [phone] Fax [phone]

## 2023-09-21 ENCOUNTER — Ambulatory Visit: Payer: Medicare Other | Admitting: Cardiology

## 2023-10-08 LAB — LAB REPORT - SCANNED
A1c: 5.9
EGFR: 59

## 2023-11-02 LAB — LAB REPORT - SCANNED: EGFR: 45

## 2023-11-08 NOTE — Progress Notes (Unsigned)
 Cardiology Office Note   Date:  11/10/2023   ID:  [name], DOB 01-30-1926, MRN [id number]  PCP:  Joan GORMAN Dine, MD  Cardiologist:   Joan Aguirre [place], MD   Chief Complaint  Patient presents with   Atrial Fibrillation      History of Present Illness: [name] is a 89 y.o. female who is seen for follow up of Afib. She has a history of DM, HLD, HTN and prior CVA ( right pontine/thalamic in 2007). She has a history of Afib diagnosed in August 2019 and is no longer on anticoagulation. Prior Echo in August showed normal EF with mild MR, moderate TR. Estimated RV systolic pressure 58 mmHg. Marked biatrial enlargement. She has moderate carotid arterial disease 40-59% on the right and < 39% on the left.   She has pulmonary fibrosis and has been on Palliative care. She is residing at [facility]   On follow up today she is doing OK. Denies any chest pain or dyspnea. Mild swelling of ankles that is stable. No cough.  BP is well controlled.     Past Medical History:  Diagnosis Date   Acute bronchitis due to Rhinovirus 05/13/2022   Acute encephalopathy 06/27/2019   Acute hypoxemic respiratory failure (HCC) 12/06/2017   Acute postoperative pain of left knee 09/16/2021   Acute respiratory failure with hypoxia (HCC) 05/08/2022   Altered mental status 06/26/2019   Arthritis    Atrial fibrillation (HCC)    Bilateral subdural hematomas (HCC) 07/16/2019   Cancer (HCC)    breast   Cataract    CHI (closed head injury), initial encounter 07/14/2019   Diabetes mellitus without complication (HCC)    type 2   Encounter to establish care 02/20/2020   Fever, unspecified 06/27/2019   Frequent UTI    Heat stroke 06/27/2019   Hyperlipidemia    Hypertension    Left radial head fracture 07/14/2019   Neck pain    Pneumonia    Scoliosis    SDH (subdural hematoma) (HCC)    Stroke [facility])     Past Surgical History:  Procedure Laterality Date   APPENDECTOMY     BREAST  LUMPECTOMY Left    CHOLECYSTECTOMY     JOINT REPLACEMENT     bilat knee    REPLACEMENT TOTAL KNEE BILATERAL Bilateral    TONSILLECTOMY     UMBILICAL HERNIA REPAIR N/A 08/20/2022   Procedure: OPEN UMBILICAL HERNIA REPAIR WITH MESH;  Surgeon: Belinda Cough, MD;  Location: [facility] OR;  Service: General;  Laterality: N/A;     Current Outpatient Medications  Medication Sig Dispense Refill   acetaminophen  (TYLENOL ) 325 MG tablet Take 650 mg by mouth every 6 (six) hours as needed (pain).     amoxicillin -clavulanate (AUGMENTIN ) 875-125 MG tablet Take 1 tablet by mouth 2 (two) times daily.     Apoaequorin (PREVAGEN EXTRA STRENGTH) 20 MG CAPS Take 20 mg by mouth daily.     atorvastatin  (LIPITOR ) 40 MG tablet Take 1 tablet (40 mg total) by mouth daily. (Patient taking differently: Take 40 mg by mouth every evening.) 90 tablet 3   Cholecalciferol  (VITAMIN D3) 50 MCG (2000 UT) TABS Take 2,000 Units by mouth in the morning.     estradiol (ESTRACE) 0.1 MG/GM vaginal cream Place 1 Applicatorful vaginally See admin instructions. Apply 0.5g at bedtime on Mondays and Thursdays only.     furosemide  (LASIX ) 20 MG tablet Take 20 mg by mouth daily.     hydrALAZINE  (APRESOLINE )  25 MG tablet Take 1 tablet (25 mg total) by mouth 3 (three) times daily. (Patient taking differently: Take 25 mg by mouth every 6 (six) hours.) 270 tablet 3   hydrochlorothiazide  (HYDRODIURIL ) 25 MG tablet Take 50 mg by mouth daily.     HYDROcodone -acetaminophen  (NORCO/VICODIN) 5-325 MG tablet Take 1 tablet by mouth every 6 (six) hours as needed for moderate pain. (Patient taking differently: Take 1 tablet by mouth every 8 (eight) hours as needed for moderate pain. And once in am) 30 tablet 0   losartan (COZAAR) 25 MG tablet Take 25 mg by mouth daily.     Magnesium  250 MG TABS Take 250 mg by mouth every evening.     metFORMIN  (GLUCOPHAGE ) 500 MG tablet Take 1 tablet (500 mg total) by mouth daily with breakfast. 90 tablet 0   mirabegron  ER  (MYRBETRIQ ) 50 MG TB24 tablet Take 50 mg by mouth daily.     mirtazapine  (REMERON ) 15 MG tablet Take 15 mg by mouth at bedtime.     mirtazapine  (REMERON ) 30 MG tablet Take 1 tablet by mouth at bedtime. (Patient taking differently: Take 15 tablets by mouth at bedtime.)     omeprazole  (PRILOSEC) 40 MG capsule Take 40 mg by mouth daily.     polyethylene glycol (MIRALAX  / GLYCOLAX ) 17 g packet Take 17 g by mouth daily as needed (constipation).     potassium chloride  (KLOR-CON  M) 10 MEQ tablet Take 40 mEq by mouth daily.     rOPINIRole  (REQUIP ) 0.25 MG tablet Take 0.25 mg by mouth 3 (three) times daily. (Patient taking differently: Take 0.5 mg by mouth 3 (three) times daily.)     rOPINIRole  (REQUIP ) 0.5 MG tablet Take 0.5 mg by mouth 3 (three) times daily.     sertraline  (ZOLOFT ) 100 MG tablet Take 100 mg by mouth daily.     spironolactone (ALDACTONE) 25 MG tablet Take 25 mg by mouth 2 (two) times daily.     traZODone  (DESYREL ) 50 MG tablet Take 50 mg by mouth at bedtime.     D-Mannose 500 MG CAPS Take 2,000 mg by mouth daily.     dextrose  50 % solution Inject 1 ampule into the vein as needed for low blood sugar.     Glucagon 1 MG/0.2ML SOLN Inject 1 mg into the skin as needed (hypoglycemia).     Glucose 15 g PACK Take 15 g by mouth 2 (two) times daily as needed (hypoglycemia).     guaiFENesin -dextromethorphan (ROBITUSSIN DM) 100-10 MG/5ML syrup Take 15 mLs by mouth every 4 (four) hours as needed for cough.     LEVEMIR FLEXTOUCH 100 UNIT/ML FlexTouch Pen Inject 6-9 Units into the skin See admin instructions. Inject 9 units in the morning and 6 units at bedtime.     loperamide (IMODIUM) 2 MG capsule Take 2 mg by mouth as needed for diarrhea or loose stools (up to 8 doses in 24 hours).     melatonin 5 MG TABS Take 5 mg by mouth at bedtime.     NOVOLOG  FLEXPEN 100 UNIT/ML FlexPen Inject 0-10 Units into the skin See admin instructions. Sliding scale: CBG 1 to 150=0 units, 151-200=2 units, 201-250=4 units,  251-300=6 units, 301-350=8 units, 351-400=10 units >400 call Dr < 70 call Dr BID     ondansetron  (ZOFRAN ) 4 MG tablet Take 1 tablet (4 mg total) by mouth every 8 (eight) hours as needed for nausea or vomiting. (Patient taking differently: Take 4 mg by mouth every 6 (six) hours as  needed for nausea or vomiting.) 90 tablet 0   No current facility-administered medications for this visit.    Allergies:   Diltiazem  hcl, Duloxetine  hcl, and Ace inhibitors    Social History:  The patient  reports that she has never smoked. She has never used smokeless tobacco. She reports that she does not drink alcohol and does not use drugs.   Family History:  The patient's family history includes Alcohol abuse in her brother; Arthritis in her mother; Cancer in her brother and mother; Heart disease in her father.    ROS:  Please see the history of present illness.   Otherwise, review of systems are positive for none.   All other systems are reviewed and negative.    PHYSICAL EXAM: VS:  BP (!) 144/64 (BP Location: Left Arm, Patient Position: Sitting, Cuff Size: Normal)   Pulse (!) 58   Wt 129 lb 6.4 oz (58.7 kg)   SpO2 91%   BMI 26.14 kg/m  , BMI Body mass index is 26.14 kg/m. GEN: Elderly WF, Well nourished, well developed, in no acute distress  HEENT: normal  Neck: no JVD, carotid bruits, or masses Cardiac: IRRR; no murmurs, rubs, or gallops, 1+ ankle edema  Respiratory:  Diffuse crackles. GI: soft, nontender, nondistended, + BS MS: no deformity or atrophy  Skin: warm and dry, no rash Neuro:  Strength and sensation are intact Psych: euthymic mood, full affect   EKG:  EKG is not ordered today.    Recent Labs: 04/27/2023: ALT 19; BUN 21; Creatinine, Ser 1.07; Hemoglobin 13.1; Platelets 198; Potassium 4.9; Sodium 138    Lipid Panel    Component Value Date/Time   CHOL 115 02/24/2020 0853   CHOL 176 11/09/2012 1604   TRIG 71 02/24/2020 0853   TRIG 189 (H) 07/14/2013 1525   TRIG 242 (H)  11/09/2012 1604   HDL 46 02/24/2020 0853   HDL 52 07/14/2013 1525   HDL 44 11/09/2012 1604   CHOLHDL 2.5 02/24/2020 0853   CHOLHDL 5.6 06/27/2019 0148   VLDL 18 06/27/2019 0148   LDLCALC 54 02/24/2020 0853   LDLCALC 102 (H) 07/14/2013 1525   LDLCALC 84 11/09/2012 1604      Wt Readings from Last 3 Encounters:  11/10/23 129 lb 6.4 oz (58.7 kg)  04/27/23 135 lb (61.2 kg)  08/20/22 133 lb 9.6 oz (60.6 kg)    Dated 11/02/23: normal CMET. Hgb 11.9  Other studies Reviewed: Additional studies/ records that were reviewed today include:   Echo 12/07/17: Study Conclusions   - Left ventricle: The cavity size was normal. Wall thickness was   increased increased in a pattern of mild to moderate LVH.   Systolic function was normal. The estimated ejection fraction was   in the range of 60% to 65%. Wall motion was normal; there were no   regional wall motion abnormalities. - Aortic valve: Mildly calcified annulus. Trileaflet; mildly   thickened leaflets. Valve area (VTI): 2.15 cm^2. Valve area   (Vmax): 2.15 cm^2. - Mitral valve: Mildly calcified annulus. Normal thickness leaflets   . There was mild regurgitation. - Left atrium: The atrium was severely dilated. - Right ventricle: Systolic function was mildly reduced. - Right atrium: The atrium was severely dilated. - Atrial septum: No defect or patent foramen ovale was identified. - Tricuspid valve: There was moderate regurgitation. - Pulmonary arteries: Systolic pressure was moderately increased.   PA peak pressure: 58 mm Hg (S).   ASSESSMENT AND PLAN:  1.  Atrial fibrillation  permanent. Rate is well controlled and she is asymptomatic. No longer on anticoagulation due to advanced age and poor prognosis.  2. Pulmonary HTN secondary to chronic pulmonary fibrosis  3. Chronic diastolic CHF secondary to age and uncontrolled HTN. Appears well compensated.  4. HTN adequate control. Continue Rx  5. DM on metformin  6. HLD   Current  medicines are reviewed at length with the patient today.  The patient does not have concerns regarding medicines.  No orders of the defined types were placed in this encounter.    Disposition:  can follow up PRN   Signed, Guy Toney [place], MD  11/10/2023 3:09 PM    [facility] [address], [place], [place], [address] Phone [phone], Fax [phone]

## 2023-11-10 ENCOUNTER — Encounter: Payer: Self-pay | Admitting: Cardiology

## 2023-11-10 ENCOUNTER — Ambulatory Visit: Attending: Cardiology | Admitting: Cardiology

## 2023-11-10 VITALS — BP 144/64 | HR 58 | Wt 129.4 lb

## 2023-11-10 DIAGNOSIS — I10 Essential (primary) hypertension: Secondary | ICD-10-CM | POA: Diagnosis not present

## 2023-11-10 DIAGNOSIS — I4821 Permanent atrial fibrillation: Secondary | ICD-10-CM

## 2023-11-10 DIAGNOSIS — E7800 Pure hypercholesterolemia, unspecified: Secondary | ICD-10-CM | POA: Diagnosis not present

## 2023-11-10 NOTE — Patient Instructions (Signed)
 Medication Instructions:  Continue same medications  Lab Work: None ordered  Testing/Procedures: None ordered  Follow-Up: At [facility], you and your health needs are our priority.  As part of our continuing mission to provide you with exceptional heart care, our providers are all part of one team.  This team includes your primary Cardiologist (physician) and Advanced Practice Providers or APPs (Physician Assistants and Nurse Practitioners) who all work together to provide you with the care you need, when you need it.  Your next appointment:  As Needed    Provider:  Dr.Jordan   We recommend signing up for the patient portal called "MyChart".  Sign up information is provided on this After Visit Summary.  MyChart is used to connect with patients for Virtual Visits (Telemedicine).  Patients are able to view lab/test results, encounter notes, upcoming appointments, etc.  Non-urgent messages can be sent to your provider as well.   To learn more about what you can do with MyChart, go to [email or web address].

## 2023-11-10 NOTE — Addendum Note (Signed)
 Addended by: HEZEKIAH BOBETTA PARAS on: 11/10/2023 03:40 PM   Modules accepted: Orders
